# Patient Record
Sex: Male | Born: 1940
Health system: Southern US, Community
[De-identification: ages and names within clinical notes are randomized; demographics above are authoritative.]

## PROBLEM LIST (undated history)

## (undated) DIAGNOSIS — I10 Essential (primary) hypertension: Secondary | ICD-10-CM

## (undated) DIAGNOSIS — K759 Inflammatory liver disease, unspecified: Secondary | ICD-10-CM

## (undated) DIAGNOSIS — D649 Anemia, unspecified: Secondary | ICD-10-CM

## (undated) DIAGNOSIS — Z87442 Personal history of urinary calculi: Secondary | ICD-10-CM

## (undated) DIAGNOSIS — N189 Chronic kidney disease, unspecified: Secondary | ICD-10-CM

## (undated) DIAGNOSIS — E785 Hyperlipidemia, unspecified: Secondary | ICD-10-CM

## (undated) DIAGNOSIS — E119 Type 2 diabetes mellitus without complications: Secondary | ICD-10-CM

## (undated) HISTORY — DX: Chronic kidney disease, unspecified: N18.9

## (undated) HISTORY — DX: Hyperlipidemia, unspecified: E78.5

## (undated) HISTORY — DX: Anemia, unspecified: D64.9

---

## 2007-02-02 ENCOUNTER — Emergency Department (HOSPITAL_COMMUNITY): Admission: EM | Admit: 2007-02-02 | Discharge: 2007-02-02 | Payer: Self-pay | Admitting: Emergency Medicine
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
## 2007-03-02 ENCOUNTER — Encounter (HOSPITAL_COMMUNITY): Admission: RE | Admit: 2007-03-02 | Discharge: 2007-04-01 | Payer: Self-pay | Admitting: Internal Medicine

## 2007-08-24 ENCOUNTER — Ambulatory Visit (HOSPITAL_COMMUNITY): Admission: RE | Admit: 2007-08-24 | Discharge: 2007-08-24 | Payer: Self-pay | Admitting: Nephrology

## 2007-08-24 IMAGING — US US RENAL
1 series · 14 of 25 positions shown · non-contrast
Comparison: None

CLINICAL DATA: Renal insufficiency

RENAL/URINARY TRACT ULTRASOUND
TECHNIQUE: Complete ultrasound examination of the urinary tract
was performed including evaluation of the kidneys renal collecting
systems and urinary bladder.

[Series 1: us renal · 0.34mm/px · 14 of 30 slices shown]
[im 1/30]
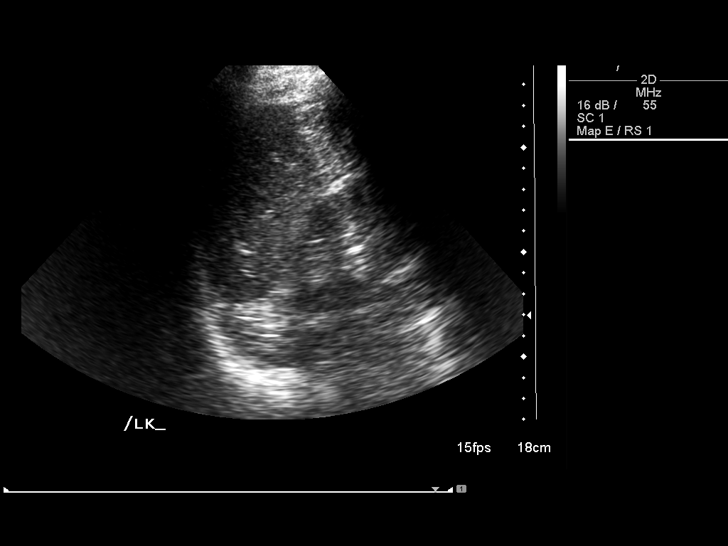
[im 3/30]
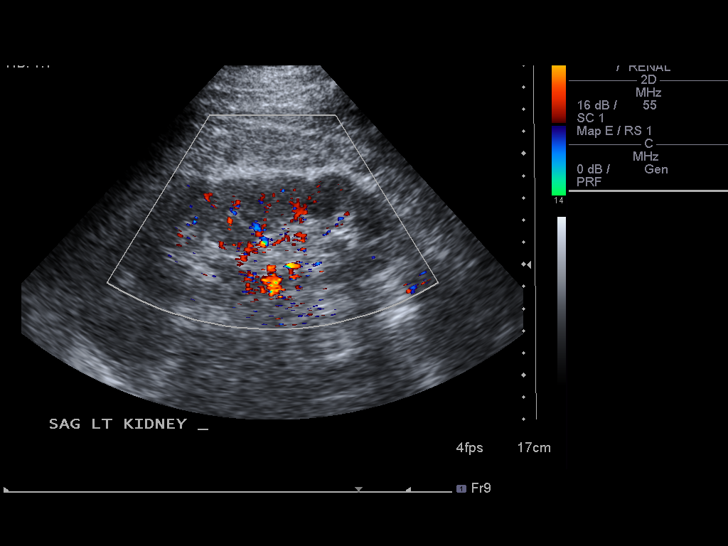
[im 5/30]
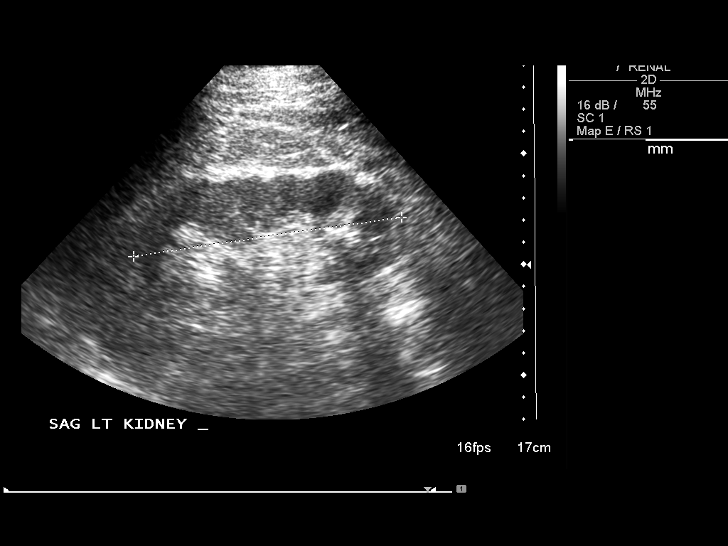
[im 8/30]
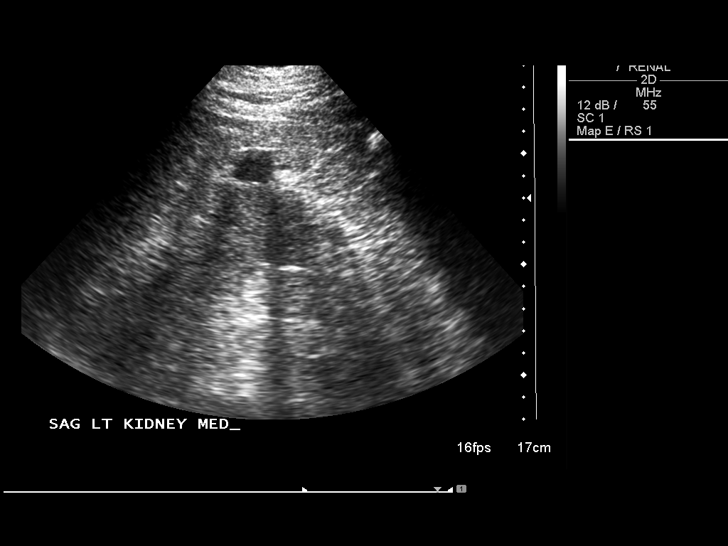
[im 10/30]
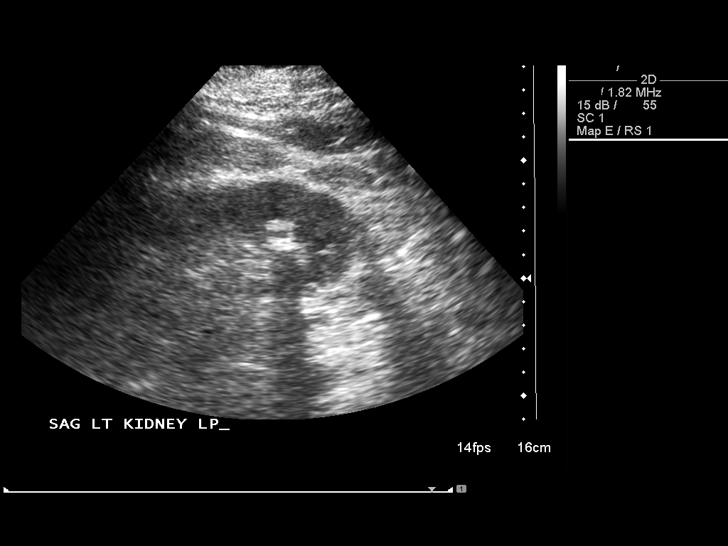
[im 11/30]
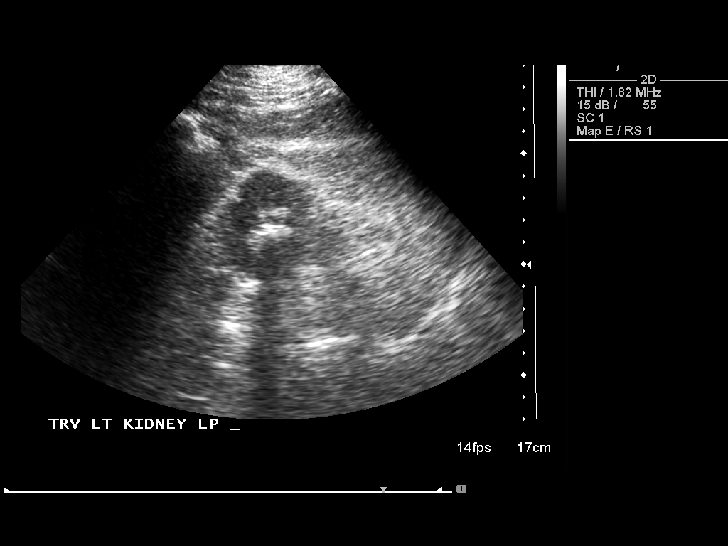
[im 14/30]
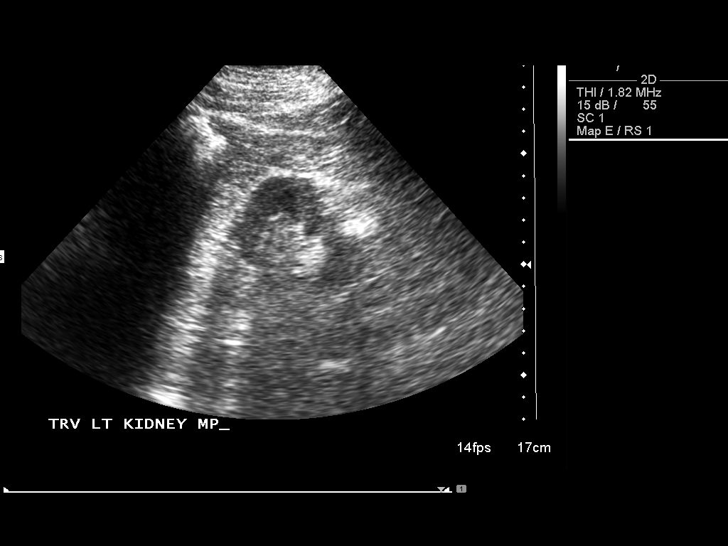
[im 16/30]
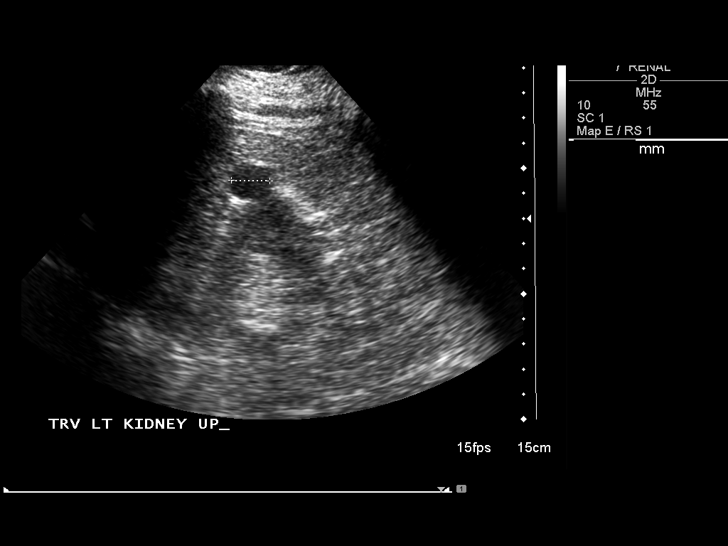
[im 19/30]
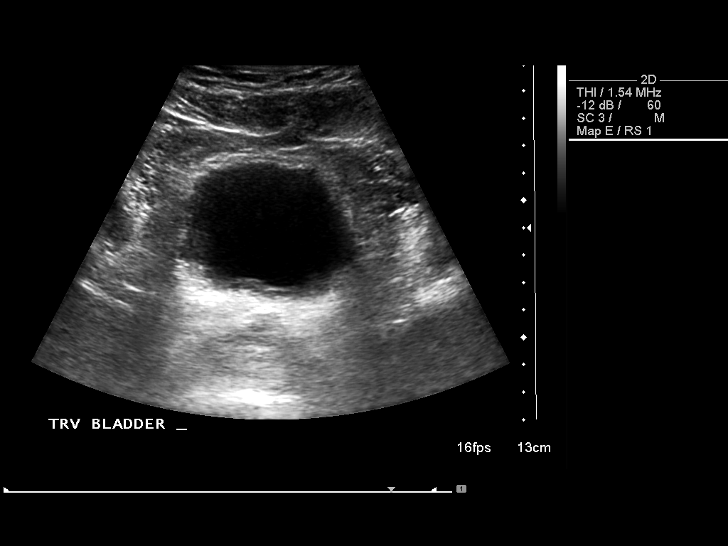
[im 20/30]
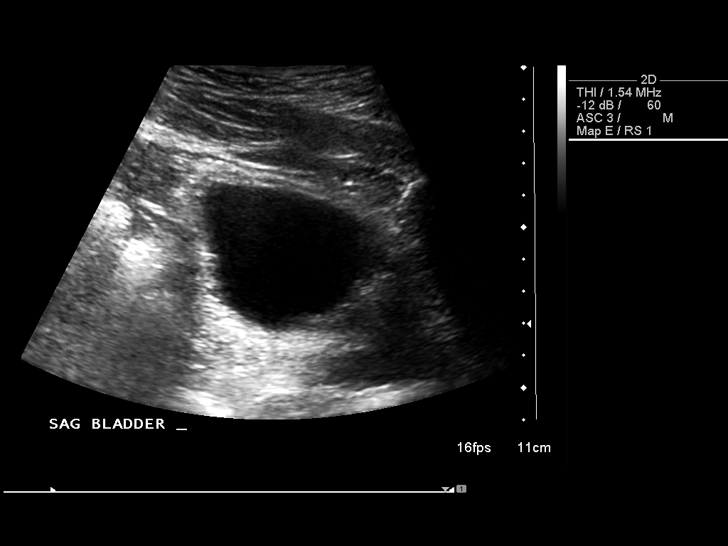
[im 22/30]
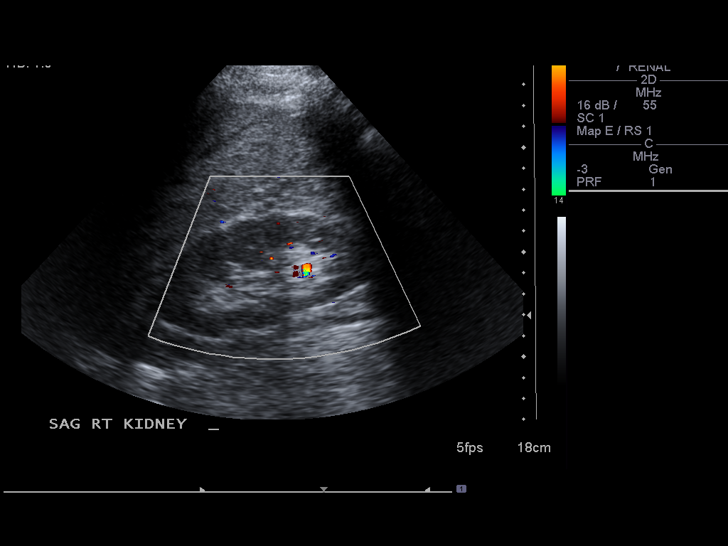
[im 25/30]
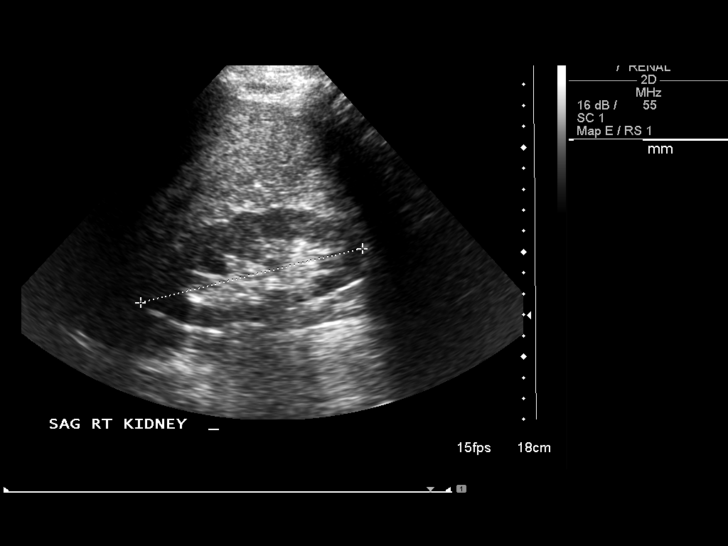
[im 27/30]
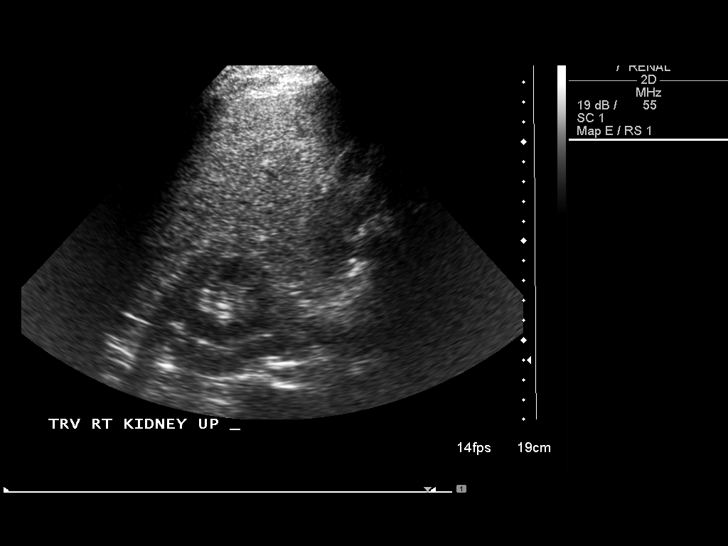
[im 30/30]
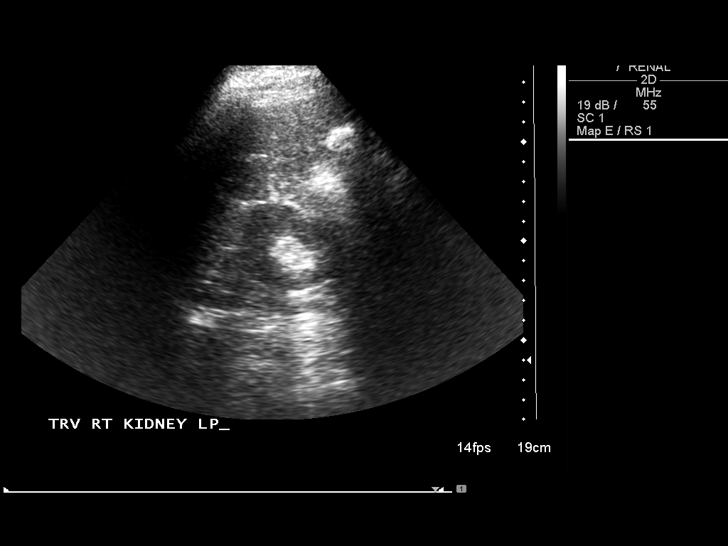

[14 of 25 positions shown; findings below may reference images not displayed]

FINDINGS: .  The right kidney is normal measuring 10.9 cm in
length.  No focal lesion or hydronephrosis.  Cortical echogenicity
and cortical volume are normal.

The left kidney measures 12.2 cm in length.  There is a 1.7 x 1.4 x
1.5 cm cyst at the upper pole laterally.  No hydronephrosis.  There
are a few echogenic foci in the lower pole likely represent small
stones.

The bladder is unremarkable, not fully distended.
IMPRESSION: Kidneys are normal in size and echogenicity.  No hydronephrosis.

simple cyst upper pole left kidney.

Renal calculi lower pole left kidney.

## 2014-05-30 DIAGNOSIS — I10 Essential (primary) hypertension: Secondary | ICD-10-CM | POA: Diagnosis not present

## 2014-05-30 DIAGNOSIS — E785 Hyperlipidemia, unspecified: Secondary | ICD-10-CM | POA: Diagnosis not present

## 2014-05-30 DIAGNOSIS — N184 Chronic kidney disease, stage 4 (severe): Secondary | ICD-10-CM | POA: Diagnosis not present

## 2014-05-30 DIAGNOSIS — E119 Type 2 diabetes mellitus without complications: Secondary | ICD-10-CM | POA: Diagnosis not present

## 2014-05-30 DIAGNOSIS — E782 Mixed hyperlipidemia: Secondary | ICD-10-CM | POA: Diagnosis not present

## 2014-05-30 DIAGNOSIS — D638 Anemia in other chronic diseases classified elsewhere: Secondary | ICD-10-CM | POA: Diagnosis not present

## 2014-05-30 DIAGNOSIS — E1122 Type 2 diabetes mellitus with diabetic chronic kidney disease: Secondary | ICD-10-CM | POA: Diagnosis not present

## 2014-06-28 DIAGNOSIS — E1122 Type 2 diabetes mellitus with diabetic chronic kidney disease: Secondary | ICD-10-CM | POA: Diagnosis not present

## 2014-06-28 DIAGNOSIS — I10 Essential (primary) hypertension: Secondary | ICD-10-CM | POA: Diagnosis not present

## 2014-06-28 DIAGNOSIS — Z6837 Body mass index (BMI) 37.0-37.9, adult: Secondary | ICD-10-CM | POA: Diagnosis not present

## 2014-08-09 DIAGNOSIS — E119 Type 2 diabetes mellitus without complications: Secondary | ICD-10-CM | POA: Diagnosis not present

## 2014-11-27 DIAGNOSIS — E119 Type 2 diabetes mellitus without complications: Secondary | ICD-10-CM | POA: Diagnosis not present

## 2015-03-12 DIAGNOSIS — E119 Type 2 diabetes mellitus without complications: Secondary | ICD-10-CM | POA: Diagnosis not present

## 2015-11-21 DIAGNOSIS — N184 Chronic kidney disease, stage 4 (severe): Secondary | ICD-10-CM | POA: Diagnosis not present

## 2015-11-21 DIAGNOSIS — E782 Mixed hyperlipidemia: Secondary | ICD-10-CM | POA: Diagnosis not present

## 2015-11-21 DIAGNOSIS — I10 Essential (primary) hypertension: Secondary | ICD-10-CM | POA: Diagnosis not present

## 2015-11-21 DIAGNOSIS — K219 Gastro-esophageal reflux disease without esophagitis: Secondary | ICD-10-CM | POA: Diagnosis not present

## 2015-11-21 DIAGNOSIS — E119 Type 2 diabetes mellitus without complications: Secondary | ICD-10-CM | POA: Diagnosis not present

## 2015-11-21 DIAGNOSIS — D649 Anemia, unspecified: Secondary | ICD-10-CM | POA: Diagnosis not present

## 2015-11-21 DIAGNOSIS — D631 Anemia in chronic kidney disease: Secondary | ICD-10-CM | POA: Diagnosis not present

## 2015-11-21 DIAGNOSIS — E1122 Type 2 diabetes mellitus with diabetic chronic kidney disease: Secondary | ICD-10-CM | POA: Diagnosis not present

## 2015-12-25 DIAGNOSIS — Z23 Encounter for immunization: Secondary | ICD-10-CM | POA: Diagnosis not present

## 2016-01-02 DIAGNOSIS — E782 Mixed hyperlipidemia: Secondary | ICD-10-CM | POA: Diagnosis not present

## 2016-01-02 DIAGNOSIS — D631 Anemia in chronic kidney disease: Secondary | ICD-10-CM | POA: Diagnosis not present

## 2016-01-02 DIAGNOSIS — I10 Essential (primary) hypertension: Secondary | ICD-10-CM | POA: Diagnosis not present

## 2016-01-02 DIAGNOSIS — N184 Chronic kidney disease, stage 4 (severe): Secondary | ICD-10-CM | POA: Diagnosis not present

## 2016-01-02 DIAGNOSIS — E1122 Type 2 diabetes mellitus with diabetic chronic kidney disease: Secondary | ICD-10-CM | POA: Diagnosis not present

## 2016-01-08 DIAGNOSIS — H2513 Age-related nuclear cataract, bilateral: Secondary | ICD-10-CM | POA: Diagnosis not present

## 2016-01-08 DIAGNOSIS — E119 Type 2 diabetes mellitus without complications: Secondary | ICD-10-CM | POA: Diagnosis not present

## 2016-01-08 DIAGNOSIS — Z794 Long term (current) use of insulin: Secondary | ICD-10-CM | POA: Diagnosis not present

## 2016-01-29 DIAGNOSIS — E1122 Type 2 diabetes mellitus with diabetic chronic kidney disease: Secondary | ICD-10-CM | POA: Diagnosis not present

## 2016-01-29 DIAGNOSIS — I10 Essential (primary) hypertension: Secondary | ICD-10-CM | POA: Diagnosis not present

## 2016-01-29 DIAGNOSIS — Z6835 Body mass index (BMI) 35.0-35.9, adult: Secondary | ICD-10-CM | POA: Diagnosis not present

## 2016-01-29 DIAGNOSIS — D631 Anemia in chronic kidney disease: Secondary | ICD-10-CM | POA: Diagnosis not present

## 2016-01-29 DIAGNOSIS — N184 Chronic kidney disease, stage 4 (severe): Secondary | ICD-10-CM | POA: Diagnosis not present

## 2016-01-29 DIAGNOSIS — E782 Mixed hyperlipidemia: Secondary | ICD-10-CM | POA: Diagnosis not present

## 2016-03-18 DIAGNOSIS — E1165 Type 2 diabetes mellitus with hyperglycemia: Secondary | ICD-10-CM | POA: Diagnosis not present

## 2016-03-20 DIAGNOSIS — Z125 Encounter for screening for malignant neoplasm of prostate: Secondary | ICD-10-CM | POA: Diagnosis not present

## 2016-03-20 DIAGNOSIS — I10 Essential (primary) hypertension: Secondary | ICD-10-CM | POA: Diagnosis not present

## 2016-03-20 DIAGNOSIS — E1122 Type 2 diabetes mellitus with diabetic chronic kidney disease: Secondary | ICD-10-CM | POA: Diagnosis not present

## 2016-03-24 DIAGNOSIS — E782 Mixed hyperlipidemia: Secondary | ICD-10-CM | POA: Diagnosis not present

## 2016-03-24 DIAGNOSIS — N184 Chronic kidney disease, stage 4 (severe): Secondary | ICD-10-CM | POA: Diagnosis not present

## 2016-03-24 DIAGNOSIS — Z6836 Body mass index (BMI) 36.0-36.9, adult: Secondary | ICD-10-CM | POA: Diagnosis not present

## 2016-03-24 DIAGNOSIS — I10 Essential (primary) hypertension: Secondary | ICD-10-CM | POA: Diagnosis not present

## 2016-03-24 DIAGNOSIS — K219 Gastro-esophageal reflux disease without esophagitis: Secondary | ICD-10-CM | POA: Diagnosis not present

## 2016-03-24 DIAGNOSIS — E1122 Type 2 diabetes mellitus with diabetic chronic kidney disease: Secondary | ICD-10-CM | POA: Diagnosis not present

## 2016-03-24 DIAGNOSIS — D631 Anemia in chronic kidney disease: Secondary | ICD-10-CM | POA: Diagnosis not present

## 2016-04-01 DIAGNOSIS — R809 Proteinuria, unspecified: Secondary | ICD-10-CM | POA: Diagnosis not present

## 2016-04-01 DIAGNOSIS — E1129 Type 2 diabetes mellitus with other diabetic kidney complication: Secondary | ICD-10-CM | POA: Diagnosis not present

## 2016-04-01 DIAGNOSIS — D649 Anemia, unspecified: Secondary | ICD-10-CM | POA: Diagnosis not present

## 2016-04-01 DIAGNOSIS — Z7189 Other specified counseling: Secondary | ICD-10-CM | POA: Diagnosis not present

## 2016-04-01 DIAGNOSIS — N183 Chronic kidney disease, stage 3 (moderate): Secondary | ICD-10-CM | POA: Diagnosis not present

## 2016-04-10 DIAGNOSIS — E1122 Type 2 diabetes mellitus with diabetic chronic kidney disease: Secondary | ICD-10-CM | POA: Diagnosis not present

## 2016-04-10 DIAGNOSIS — N184 Chronic kidney disease, stage 4 (severe): Secondary | ICD-10-CM | POA: Diagnosis not present

## 2016-04-10 DIAGNOSIS — E782 Mixed hyperlipidemia: Secondary | ICD-10-CM | POA: Diagnosis not present

## 2016-04-27 ENCOUNTER — Other Ambulatory Visit (HOSPITAL_COMMUNITY): Payer: Self-pay | Admitting: Nephrology

## 2016-04-27 DIAGNOSIS — N183 Chronic kidney disease, stage 3 unspecified: Secondary | ICD-10-CM

## 2016-05-07 ENCOUNTER — Ambulatory Visit (HOSPITAL_COMMUNITY)
Admission: RE | Admit: 2016-05-07 | Discharge: 2016-05-07 | Disposition: A | Discharge status: Home / Self Care | Payer: Medicare HMO | Source: Ambulatory Visit | Attending: Nephrology | Admitting: Nephrology

## 2016-05-07 DIAGNOSIS — N183 Chronic kidney disease, stage 3 unspecified: Secondary | ICD-10-CM

## 2016-05-07 DIAGNOSIS — N200 Calculus of kidney: Secondary | ICD-10-CM | POA: Insufficient documentation

## 2016-05-07 DIAGNOSIS — I10 Essential (primary) hypertension: Secondary | ICD-10-CM | POA: Diagnosis not present

## 2016-05-07 DIAGNOSIS — Z1159 Encounter for screening for other viral diseases: Secondary | ICD-10-CM | POA: Diagnosis not present

## 2016-05-07 DIAGNOSIS — N281 Cyst of kidney, acquired: Secondary | ICD-10-CM | POA: Insufficient documentation

## 2016-05-07 DIAGNOSIS — Z79899 Other long term (current) drug therapy: Secondary | ICD-10-CM | POA: Diagnosis not present

## 2016-05-07 DIAGNOSIS — D509 Iron deficiency anemia, unspecified: Secondary | ICD-10-CM | POA: Diagnosis not present

## 2016-05-07 DIAGNOSIS — E559 Vitamin D deficiency, unspecified: Secondary | ICD-10-CM | POA: Diagnosis not present

## 2016-05-07 DIAGNOSIS — R809 Proteinuria, unspecified: Secondary | ICD-10-CM | POA: Diagnosis not present

## 2016-06-10 DIAGNOSIS — Z6836 Body mass index (BMI) 36.0-36.9, adult: Secondary | ICD-10-CM | POA: Diagnosis not present

## 2016-06-10 DIAGNOSIS — E782 Mixed hyperlipidemia: Secondary | ICD-10-CM | POA: Diagnosis not present

## 2016-06-10 DIAGNOSIS — E1122 Type 2 diabetes mellitus with diabetic chronic kidney disease: Secondary | ICD-10-CM | POA: Diagnosis not present

## 2016-06-10 DIAGNOSIS — D631 Anemia in chronic kidney disease: Secondary | ICD-10-CM | POA: Diagnosis not present

## 2016-06-10 DIAGNOSIS — N184 Chronic kidney disease, stage 4 (severe): Secondary | ICD-10-CM | POA: Diagnosis not present

## 2016-06-10 DIAGNOSIS — I10 Essential (primary) hypertension: Secondary | ICD-10-CM | POA: Diagnosis not present

## 2016-06-24 DIAGNOSIS — D631 Anemia in chronic kidney disease: Secondary | ICD-10-CM | POA: Diagnosis not present

## 2016-06-24 DIAGNOSIS — I10 Essential (primary) hypertension: Secondary | ICD-10-CM | POA: Diagnosis not present

## 2016-06-24 DIAGNOSIS — E1122 Type 2 diabetes mellitus with diabetic chronic kidney disease: Secondary | ICD-10-CM | POA: Diagnosis not present

## 2016-06-26 DIAGNOSIS — Z6836 Body mass index (BMI) 36.0-36.9, adult: Secondary | ICD-10-CM | POA: Diagnosis not present

## 2016-06-26 DIAGNOSIS — N184 Chronic kidney disease, stage 4 (severe): Secondary | ICD-10-CM | POA: Diagnosis not present

## 2016-06-26 DIAGNOSIS — E782 Mixed hyperlipidemia: Secondary | ICD-10-CM | POA: Diagnosis not present

## 2016-06-26 DIAGNOSIS — D631 Anemia in chronic kidney disease: Secondary | ICD-10-CM | POA: Diagnosis not present

## 2016-06-26 DIAGNOSIS — E1122 Type 2 diabetes mellitus with diabetic chronic kidney disease: Secondary | ICD-10-CM | POA: Diagnosis not present

## 2016-06-26 DIAGNOSIS — I10 Essential (primary) hypertension: Secondary | ICD-10-CM | POA: Diagnosis not present

## 2016-09-29 DIAGNOSIS — I10 Essential (primary) hypertension: Secondary | ICD-10-CM | POA: Diagnosis not present

## 2016-09-29 DIAGNOSIS — E1122 Type 2 diabetes mellitus with diabetic chronic kidney disease: Secondary | ICD-10-CM | POA: Diagnosis not present

## 2016-10-01 DIAGNOSIS — I10 Essential (primary) hypertension: Secondary | ICD-10-CM | POA: Diagnosis not present

## 2016-10-01 DIAGNOSIS — N184 Chronic kidney disease, stage 4 (severe): Secondary | ICD-10-CM | POA: Diagnosis not present

## 2016-10-01 DIAGNOSIS — D631 Anemia in chronic kidney disease: Secondary | ICD-10-CM | POA: Diagnosis not present

## 2016-10-01 DIAGNOSIS — E782 Mixed hyperlipidemia: Secondary | ICD-10-CM | POA: Diagnosis not present

## 2016-10-01 DIAGNOSIS — Z6832 Body mass index (BMI) 32.0-32.9, adult: Secondary | ICD-10-CM | POA: Diagnosis not present

## 2016-10-01 DIAGNOSIS — E1122 Type 2 diabetes mellitus with diabetic chronic kidney disease: Secondary | ICD-10-CM | POA: Diagnosis not present

## 2016-12-15 DIAGNOSIS — E782 Mixed hyperlipidemia: Secondary | ICD-10-CM | POA: Diagnosis not present

## 2016-12-15 DIAGNOSIS — I10 Essential (primary) hypertension: Secondary | ICD-10-CM | POA: Diagnosis not present

## 2016-12-15 DIAGNOSIS — E1122 Type 2 diabetes mellitus with diabetic chronic kidney disease: Secondary | ICD-10-CM | POA: Diagnosis not present

## 2016-12-17 DIAGNOSIS — E1122 Type 2 diabetes mellitus with diabetic chronic kidney disease: Secondary | ICD-10-CM | POA: Diagnosis not present

## 2016-12-17 DIAGNOSIS — D631 Anemia in chronic kidney disease: Secondary | ICD-10-CM | POA: Diagnosis not present

## 2016-12-17 DIAGNOSIS — N184 Chronic kidney disease, stage 4 (severe): Secondary | ICD-10-CM | POA: Diagnosis not present

## 2016-12-17 DIAGNOSIS — I10 Essential (primary) hypertension: Secondary | ICD-10-CM | POA: Diagnosis not present

## 2016-12-17 DIAGNOSIS — E782 Mixed hyperlipidemia: Secondary | ICD-10-CM | POA: Diagnosis not present

## 2016-12-17 DIAGNOSIS — Z6836 Body mass index (BMI) 36.0-36.9, adult: Secondary | ICD-10-CM | POA: Diagnosis not present

## 2017-02-23 DIAGNOSIS — N183 Chronic kidney disease, stage 3 (moderate): Secondary | ICD-10-CM | POA: Diagnosis not present

## 2017-02-23 DIAGNOSIS — Z79899 Other long term (current) drug therapy: Secondary | ICD-10-CM | POA: Diagnosis not present

## 2017-02-23 DIAGNOSIS — I10 Essential (primary) hypertension: Secondary | ICD-10-CM | POA: Diagnosis not present

## 2017-02-23 DIAGNOSIS — R809 Proteinuria, unspecified: Secondary | ICD-10-CM | POA: Diagnosis not present

## 2017-02-23 DIAGNOSIS — D509 Iron deficiency anemia, unspecified: Secondary | ICD-10-CM | POA: Diagnosis not present

## 2017-02-23 DIAGNOSIS — E559 Vitamin D deficiency, unspecified: Secondary | ICD-10-CM | POA: Diagnosis not present

## 2017-03-24 DIAGNOSIS — E119 Type 2 diabetes mellitus without complications: Secondary | ICD-10-CM | POA: Diagnosis not present

## 2017-03-24 DIAGNOSIS — H524 Presbyopia: Secondary | ICD-10-CM | POA: Diagnosis not present

## 2017-03-24 DIAGNOSIS — H5213 Myopia, bilateral: Secondary | ICD-10-CM | POA: Diagnosis not present

## 2017-03-24 DIAGNOSIS — Z794 Long term (current) use of insulin: Secondary | ICD-10-CM | POA: Diagnosis not present

## 2017-03-24 DIAGNOSIS — H43811 Vitreous degeneration, right eye: Secondary | ICD-10-CM | POA: Diagnosis not present

## 2017-03-24 DIAGNOSIS — H2513 Age-related nuclear cataract, bilateral: Secondary | ICD-10-CM | POA: Diagnosis not present

## 2017-03-31 DIAGNOSIS — R809 Proteinuria, unspecified: Secondary | ICD-10-CM | POA: Diagnosis not present

## 2017-03-31 DIAGNOSIS — D638 Anemia in other chronic diseases classified elsewhere: Secondary | ICD-10-CM | POA: Diagnosis not present

## 2017-03-31 DIAGNOSIS — N184 Chronic kidney disease, stage 4 (severe): Secondary | ICD-10-CM | POA: Diagnosis not present

## 2017-04-20 DIAGNOSIS — D631 Anemia in chronic kidney disease: Secondary | ICD-10-CM | POA: Diagnosis not present

## 2017-04-20 DIAGNOSIS — E782 Mixed hyperlipidemia: Secondary | ICD-10-CM | POA: Diagnosis not present

## 2017-04-20 DIAGNOSIS — I10 Essential (primary) hypertension: Secondary | ICD-10-CM | POA: Diagnosis not present

## 2017-04-20 DIAGNOSIS — E1122 Type 2 diabetes mellitus with diabetic chronic kidney disease: Secondary | ICD-10-CM | POA: Diagnosis not present

## 2017-04-20 DIAGNOSIS — N184 Chronic kidney disease, stage 4 (severe): Secondary | ICD-10-CM | POA: Diagnosis not present

## 2017-04-22 DIAGNOSIS — I10 Essential (primary) hypertension: Secondary | ICD-10-CM | POA: Diagnosis not present

## 2017-04-22 DIAGNOSIS — G9009 Other idiopathic peripheral autonomic neuropathy: Secondary | ICD-10-CM | POA: Diagnosis not present

## 2017-04-22 DIAGNOSIS — E1122 Type 2 diabetes mellitus with diabetic chronic kidney disease: Secondary | ICD-10-CM | POA: Diagnosis not present

## 2017-04-22 DIAGNOSIS — Z6837 Body mass index (BMI) 37.0-37.9, adult: Secondary | ICD-10-CM | POA: Diagnosis not present

## 2017-04-22 DIAGNOSIS — E782 Mixed hyperlipidemia: Secondary | ICD-10-CM | POA: Diagnosis not present

## 2017-04-22 DIAGNOSIS — N184 Chronic kidney disease, stage 4 (severe): Secondary | ICD-10-CM | POA: Diagnosis not present

## 2017-04-22 DIAGNOSIS — D631 Anemia in chronic kidney disease: Secondary | ICD-10-CM | POA: Diagnosis not present

## 2017-05-13 IMAGING — US US RENAL
1 series · 14 of 25 positions shown · non-contrast
Comparison: Renal ultrasound [DATE]

CLINICAL DATA: Chronic renal insufficiency stage III ; history of
kidney stones and left-sided renal cyst.

EXAM:
RENAL / URINARY TRACT ULTRASOUND COMPLETE

[Series 1: us renal · 0.23mm/px · 14 of 55 slices shown]
[im 1/55]
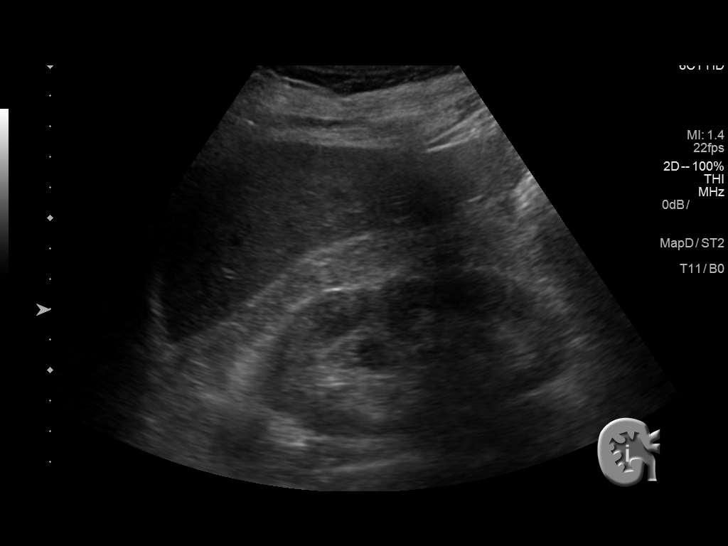
[im 5/55]
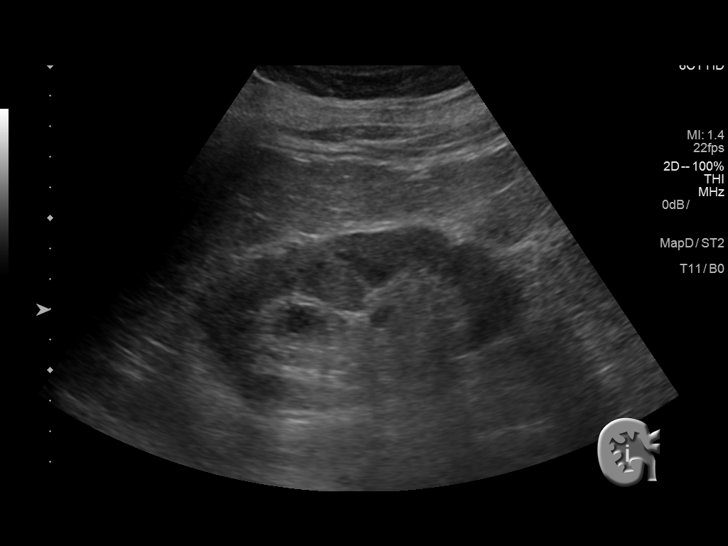
[im 10/55]
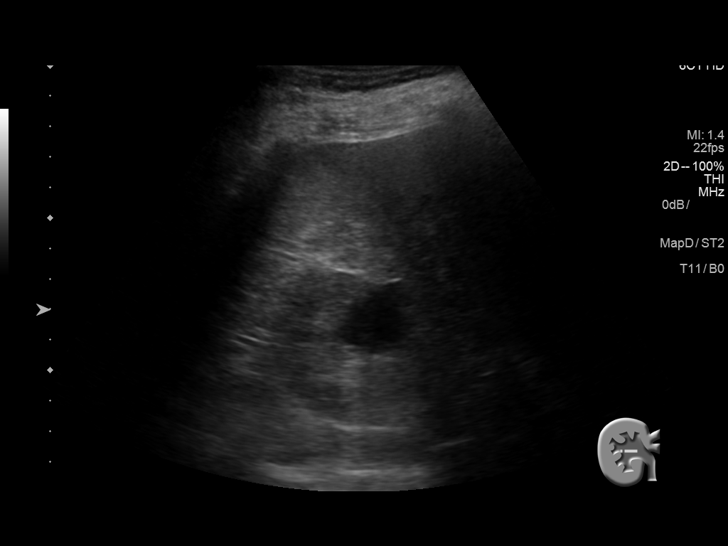
[im 14/55]
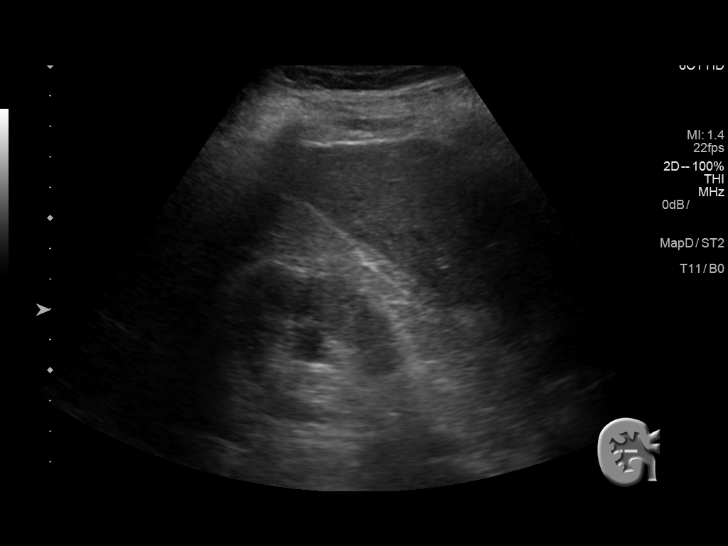
[im 19/55]
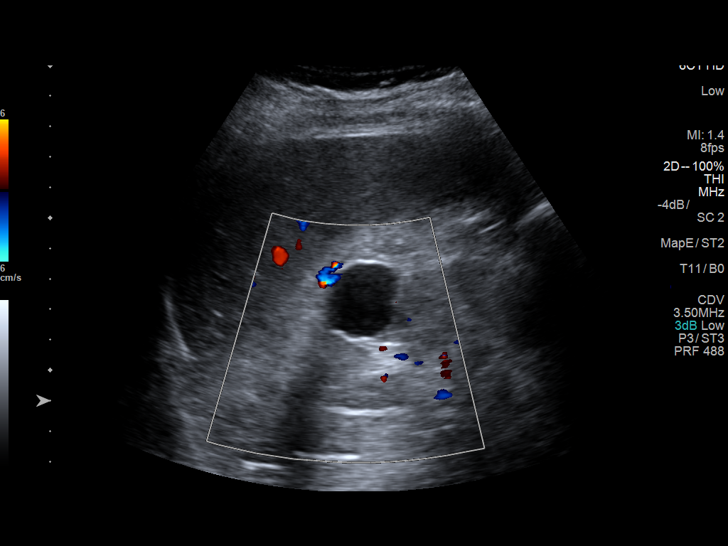
[im 21/55]
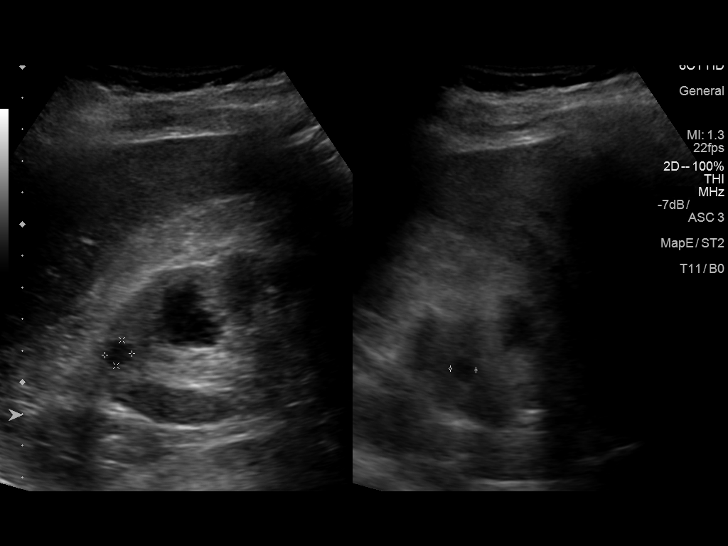
[im 25/55]
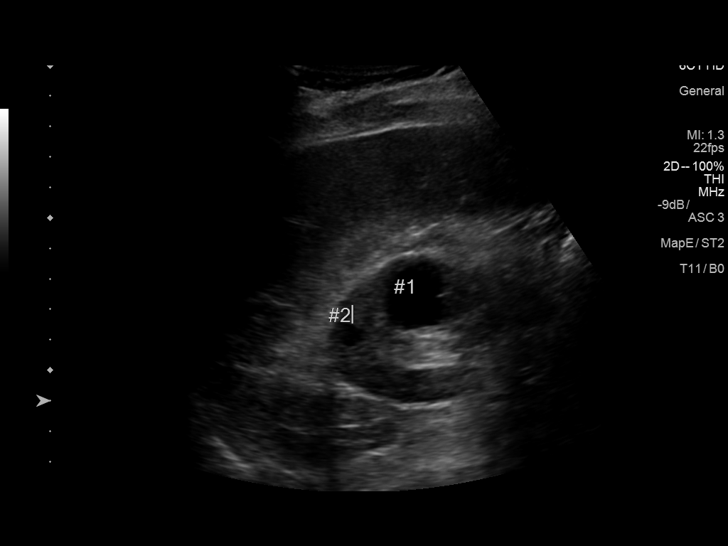
[im 30/55]
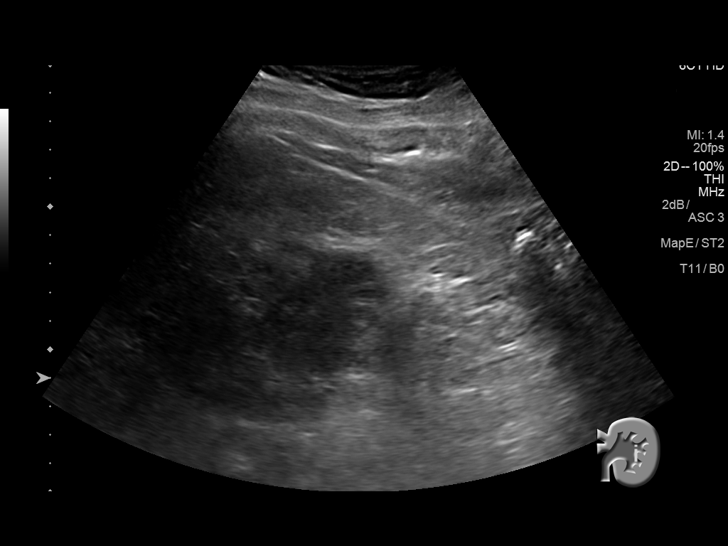
[im 34/55]
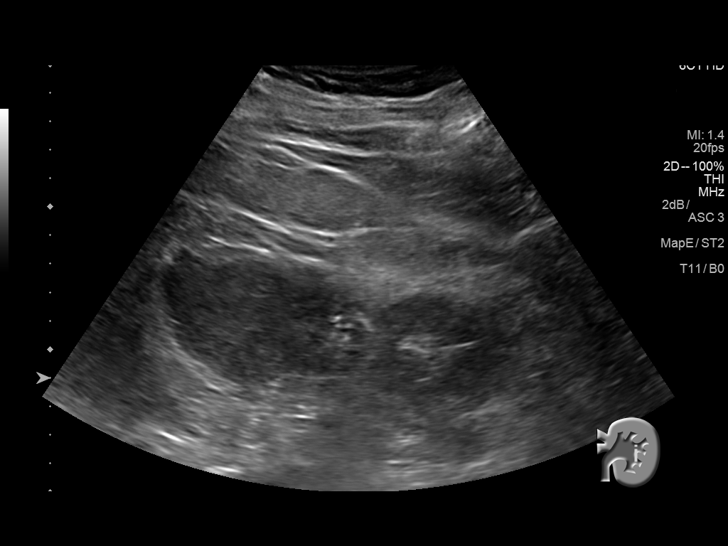
[im 37/55]
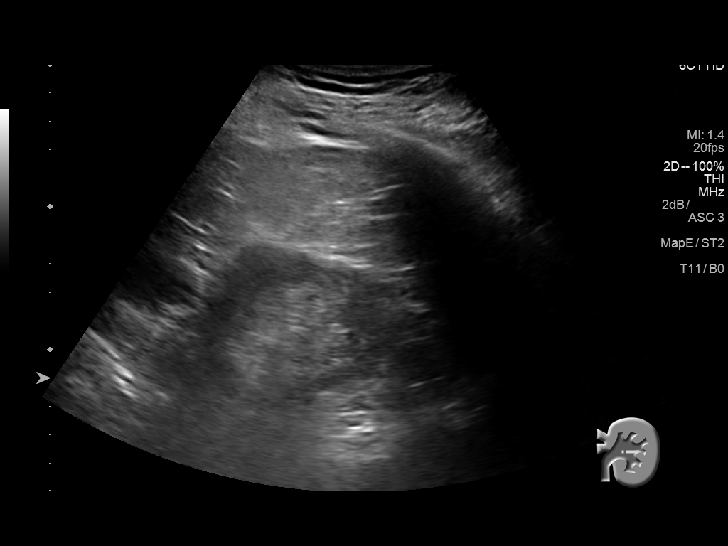
[im 41/55]
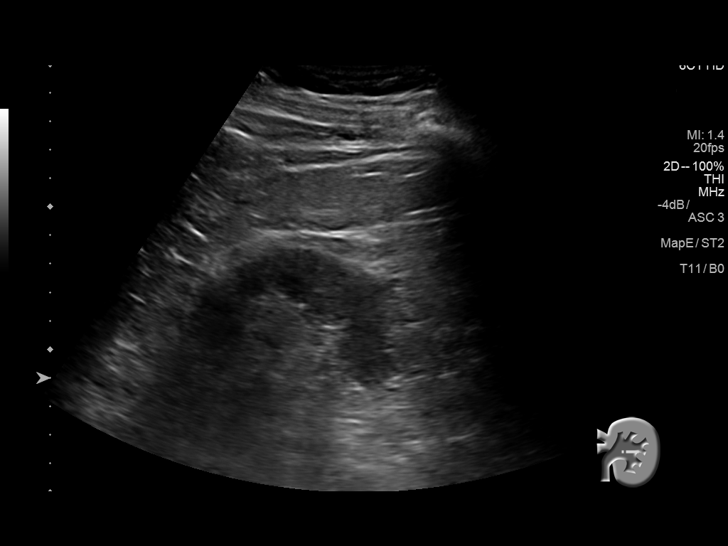
[im 46/55]
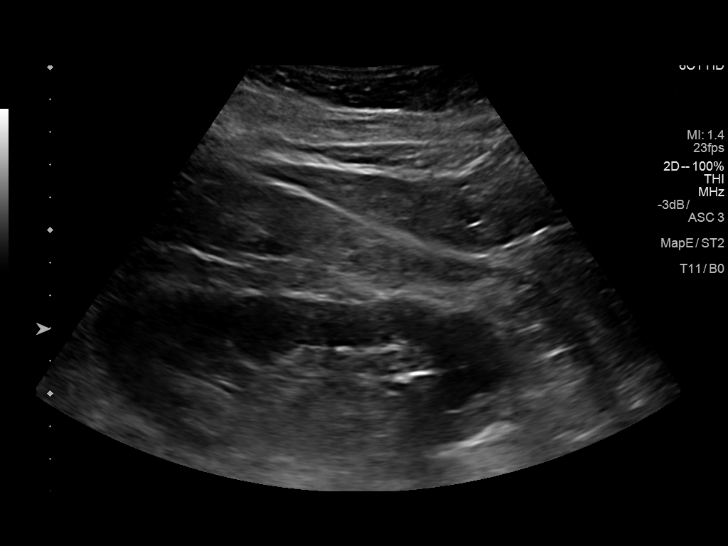
[im 50/55]
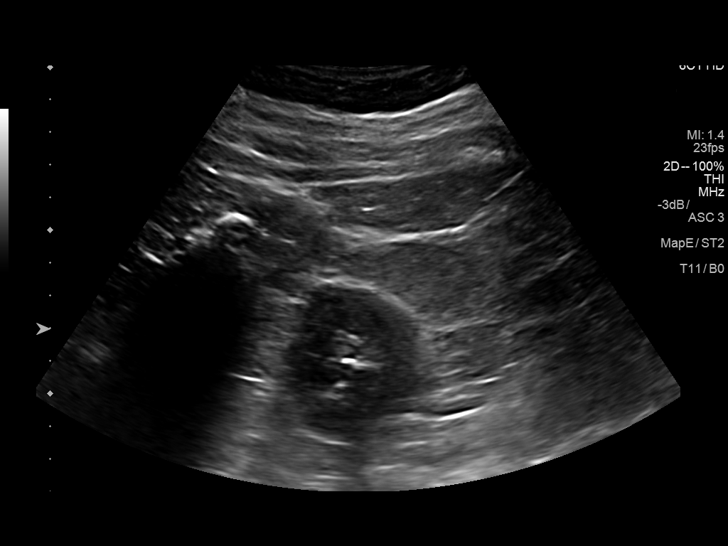
[im 55/55]
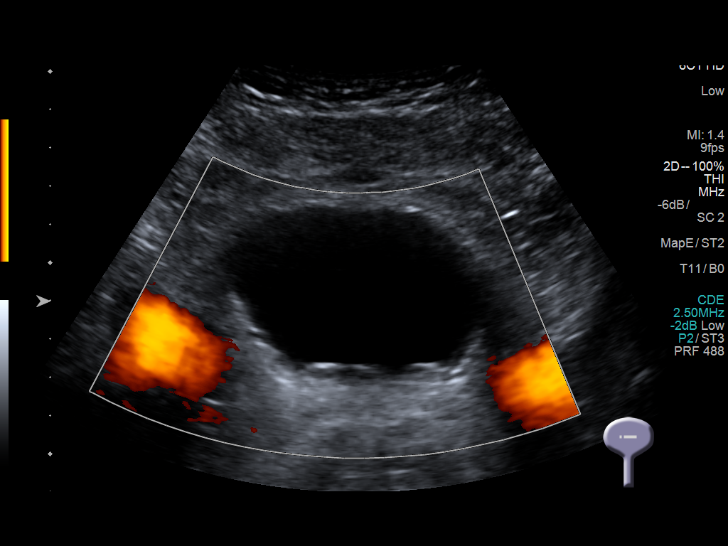

[14 of 25 positions shown; findings below may reference images not displayed]

FINDINGS: Right Kidney:

Length: 10.7 cm.. The renal cortical echotexture remains lower than
that of the adjacent liver. There is an upper pole cyst measuring
2.2 x 2.7 x 2.1 cm. There is a smaller upper pole cortical cyst
measuring 0.9 x 0.8 x 0.8 cm. There is no hydronephrosis.

Left Kidney:

Length: 11.6 cm. The renal cortical echotexture is similar to that
of the right kidney. There is an exophytic midpole cortical cyst
measuring 2.1 x 1.9 x 1.8 cm. There is a 7 mm nonobstructing lower
pole kidney stone. There is no hydronephrosis.

Bladder:

The urinary bladder is only partially distended. No ureteral jets
were observed.
IMPRESSION: Simple appearing cysts in both kidneys. Nonobstructing 7 mm lower
pole stone in the left kidney. No hydronephrosis. Visualization of
the urinary bladder was limited due to partial distention. No
ureteral jets were visualized.

## 2017-05-18 DIAGNOSIS — J019 Acute sinusitis, unspecified: Secondary | ICD-10-CM | POA: Diagnosis not present

## 2017-05-18 DIAGNOSIS — Z6837 Body mass index (BMI) 37.0-37.9, adult: Secondary | ICD-10-CM | POA: Diagnosis not present

## 2017-05-19 ENCOUNTER — Ambulatory Visit: Payer: Medicare HMO

## 2017-06-14 ENCOUNTER — Ambulatory Visit (INDEPENDENT_AMBULATORY_CARE_PROVIDER_SITE_OTHER): Payer: Self-pay

## 2017-06-14 DIAGNOSIS — Z1211 Encounter for screening for malignant neoplasm of colon: Secondary | ICD-10-CM

## 2017-06-14 MED ORDER — PEG 3350-KCL-NA BICARB-NACL 420 G PO SOLR: 4000.0000 mL | ORAL | 0 refills | Status: DC

## 2017-06-14 NOTE — Progress Notes (Signed)
Ok to schedule.   DM Meds: Half night before, none morning of.  Check CBGs every 4 hours while awake. Make sure they're comsuming clears that have some sugar in them to help prevent low blood sugar. They should check their blood sugar when they get her checked in to the endo unit.

## 2017-06-14 NOTE — Progress Notes (Signed)
Gastroenterology Pre-Procedure Review  Request Date:06/14/17 Requesting Physician: Debbra Riding ( no previous tcs)  PATIENT REVIEW QUESTIONS: The patient responded to the following health history questions as indicated:    1. Diabetes Melitis: yes tresiba 28u at bedtime. 2. Joint replacements in the past 12 months: no 3. Major health problems in the past 3 months: no 4. Has an artificial valve or MVP: no 5. Has a defibrillator: no 6. Has been advised in past to take antibiotics in advance of a procedure like teeth cleaning: no 7. Family history of colon cancer: yes (son age 22)  41. Alcohol Use: no 9. History of sleep apnea: no  10. History of coronary artery or other vascular stents placed within the last 12 months: no 11. History of any prior anesthesia complications: no    MEDICATIONS & ALLERGIES:    Patient reports the following regarding taking any blood thinners:   Plavix? no Aspirin? Yes 81mg  Coumadin? no Brilinta? no Xarelto? no Eliquis? no Pradaxa? no Savaysa? no Effient? no  Patient confirms/reports the following medications:  Current Outpatient Medications  Medication Sig Dispense Refill  . amLODipine (NORVASC) 10 MG tablet daily.    . cloNIDine (CATAPRES) 0.2 MG tablet 2 (two) times daily.    Marland Kitchen lisinopril-hydrochlorothiazide (PRINZIDE,ZESTORETIC) 20-25 MG tablet daily.    . pioglitazone (ACTOS) 15 MG tablet 15 mg daily.    . rosuvastatin (CRESTOR) 10 MG tablet daily.    . TRESIBA FLEXTOUCH 200 UNIT/ML SOPN 28 Units at bedtime.    Marland Kitchen UNABLE TO FIND Med Name: stem cell maxum supplement    . UNABLE TO FIND Med Name: super beta prostate     No current facility-administered medications for this visit.     Patient confirms/reports the following allergies:  No Known Allergies  No orders of the defined types were placed in this encounter.   AUTHORIZATION INFORMATION Primary Insurance: Otto Herb #: [id number] Pre-Cert / Josem Kaufmann required: no   SCHEDULE  INFORMATION: Procedure has been scheduled as follows:  Date: 07/07/17, Time: 8:30 Location: [facility] Dr.Fields  This Gastroenterology Pre-Precedure Review Form is being routed to the following provider(s): Walden Field NP

## 2017-06-14 NOTE — Patient Instructions (Signed)
Trevor [name]   08/15/1940 MRN: [id number]    Procedure Date: 07/07/17 Time to register: 7:30 Place to register: [facility] Procedure Time: 8:30 Scheduled provider: Dr.Fields  PREPARATION FOR COLONOSCOPY WITH TRI-LYTE SPLIT PREP  Please notify us immediately if you are diabetic, take iron supplements, or if you are on Coumadin or any other blood thinners.   Please hold the following medications: I will mail a letter with this information   You will need to purchase 1 fleet enema and 1 box of Bisacodyl 71m tablets.    1 DAY BEFORE PROCEDURE:  DATE: 07/06/17   DAY: Tuesday Continue clear liquids the entire day - NO SOLID FOOD.   Diabetic medications adjustments for today: see letter   At 2:00 pm:  Take 2 Bisacodyl tablets.   At 4:00pm:  Start drinking your solution. Make sure you mix well per instructions on the bottle. Try to drink 1 (one) 8 ounce glass every 10-15 minutes until you have consumed HALF the jug. You should complete by 6:00pm.You must keep the left over solution refrigerated until completed next day.  Continue clear liquids. You must drink plenty of clear liquids to prevent dehyration and kidney failure. Nothing to eat or drink after midnight.  EXCEPTION: If you take medications for your heart, blood pressure or breathing, you may take these medications with a small amount of clear liquid.    DAY OF PROCEDURE:   DATE: 07/07/17  DAY: Wednesday  Diabetic medications adjustments for today: see letter   Five hours before your procedure time @ 3:30am:  Finish remaining amout of bowel prep, drinking 1 (one) 8 ounce glass every 10-15 minutes until complete. You have two hours to consume remaining prep.   Three hours before your procedure time '@5' :30am:  Nothing by mouth.   At least one hour before going to the hospital:  Give yourself one Fleet enema. You may take your morning medications with sip of water unless we have instructed otherwise.      Please  see below for Dietary Information.  CLEAR LIQUIDS INCLUDE:  Water Jello (NOT red in color)   Ice Popsicles (NOT red in color)   Tea (sugar ok, no milk/cream) Powdered fruit flavored drinks  Coffee (sugar ok, no milk/cream) Gatorade/ Lemonade/ Kool-Aid  (NOT red in color)   Juice: apple, white grape, white cranberry Soft drinks  Clear bullion, consomme, broth (fat free beef/chicken/vegetable)  Carbonated beverages (any kind)  Strained chicken noodle soup Hard Candy   Remember: Clear liquids are liquids that will allow you to see your fingers on the other side of a clear glass. Be sure liquids are NOT red in color, and not cloudy, but CLEAR.  DO NOT EAT OR DRINK ANY OF THE FOLLOWING:  Dairy products of any kind   Cranberry juice Tomato juice / V8 juice   Grapefruit juice Orange juice     Red grape juice  Do not eat any solid foods, including such foods as: cereal, oatmeal, yogurt, fruits, vegetables, creamed soups, eggs, bread, crackers, pureed foods in a blender, etc.   HELPFUL HINTS FOR DRINKING PREP SOLUTION:   Make sure prep is extremely cold. Mix and refrigerate the the morning of the prep. You may also put in the freezer.   You may try mixing some Crystal Light or Country Time Lemonade if you prefer. Mix in small amounts; add more if necessary.  Try drinking through a straw  Rinse mouth with water or a mouthwash between glasses,  to remove after-taste.  Try sipping on a cold beverage /ice/ popsicles between glasses of prep.  Place a piece of sugar-free hard candy in mouth between glasses.  If you become nauseated, try consuming smaller amounts, or stretch out the time between glasses. Stop for 30-60 minutes, then slowly start back drinking.        OTHER INSTRUCTIONS  You will need a responsible adult at least 76 years of age to accompany you and drive you home. This person must remain in the waiting room during your procedure. The hospital will cancel your procedure  if you do not have a responsible adult with you.   1. Wear loose fitting clothing that is easily removed. 2. Leave jewelry and other valuables at home.  3. Remove all body piercing jewelry and leave at home. 4. Total time from sign-in until discharge is approximately 2-3 hours. 5. You should go home directly after your procedure and rest. You can resume normal activities the day after your procedure. 6. The day of your procedure you should not:  Drive  Make legal decisions  Operate machinery  Drink alcohol  Return to work   You may call the office (Dept: [phone]) before 5:00pm, or page the doctor on call [phone]) after 5:00pm, for further instructions, if necessary.   Insurance Information YOU WILL NEED TO CHECK WITH YOUR INSURANCE COMPANY FOR THE BENEFITS OF COVERAGE YOU HAVE FOR THIS PROCEDURE.  UNFORTUNATELY, NOT ALL INSURANCE COMPANIES HAVE BENEFITS TO COVER ALL OR PART OF THESE TYPES OF PROCEDURES.  IT IS YOUR RESPONSIBILITY TO CHECK YOUR BENEFITS, HOWEVER, WE WILL BE GLAD TO ASSIST YOU WITH ANY CODES YOUR INSURANCE COMPANY MAY NEED.    PLEASE NOTE THAT MOST INSURANCE COMPANIES WILL NOT COVER A SCREENING COLONOSCOPY FOR PEOPLE UNDER THE AGE OF 50  IF YOU HAVE BC[employer] INSURANCE, YOU MAY HAVE BENEFITS FOR A SCREENING COLONOSCOPY BUT IF POLYPS ARE FOUND THE DIAGNOSIS WILL CHANGE AND THEN YOU MAY HAVE A DEDUCTIBLE THAT WILL NEED TO BE MET. SO PLEASE MAKE SURE YOU CHECK YOUR BENEFITS FOR A SCREENING COLONOSCOPY AS WELL AS A DIAGNOSTIC COLONOSCOPY.

## 2017-06-16 NOTE — Progress Notes (Signed)
Letter with DM instructions mailed to the pt.

## 2017-07-07 ENCOUNTER — Other Ambulatory Visit: Payer: Self-pay

## 2017-07-07 ENCOUNTER — Encounter (HOSPITAL_COMMUNITY): Payer: Self-pay | Admitting: *Deleted

## 2017-07-07 ENCOUNTER — Ambulatory Visit (HOSPITAL_COMMUNITY)
Admission: RE | Admit: 2017-07-07 | Discharge: 2017-07-07 | Disposition: A | Discharge status: Home / Self Care | Payer: Medicare HMO | Source: Ambulatory Visit | Attending: Gastroenterology | Admitting: Gastroenterology

## 2017-07-07 ENCOUNTER — Encounter (HOSPITAL_COMMUNITY)
Admission: RE | Disposition: A | Discharge status: Home / Self Care | Payer: Self-pay | Source: Ambulatory Visit | Attending: Gastroenterology

## 2017-07-07 DIAGNOSIS — Z7984 Long term (current) use of oral hypoglycemic drugs: Secondary | ICD-10-CM | POA: Insufficient documentation

## 2017-07-07 DIAGNOSIS — Z1212 Encounter for screening for malignant neoplasm of rectum: Secondary | ICD-10-CM | POA: Diagnosis not present

## 2017-07-07 DIAGNOSIS — E119 Type 2 diabetes mellitus without complications: Secondary | ICD-10-CM | POA: Diagnosis not present

## 2017-07-07 DIAGNOSIS — D120 Benign neoplasm of cecum: Secondary | ICD-10-CM | POA: Insufficient documentation

## 2017-07-07 DIAGNOSIS — D122 Benign neoplasm of ascending colon: Secondary | ICD-10-CM | POA: Insufficient documentation

## 2017-07-07 DIAGNOSIS — K644 Residual hemorrhoidal skin tags: Secondary | ICD-10-CM | POA: Diagnosis not present

## 2017-07-07 DIAGNOSIS — Q439 Congenital malformation of intestine, unspecified: Secondary | ICD-10-CM | POA: Insufficient documentation

## 2017-07-07 DIAGNOSIS — Z79899 Other long term (current) drug therapy: Secondary | ICD-10-CM | POA: Insufficient documentation

## 2017-07-07 DIAGNOSIS — Z1211 Encounter for screening for malignant neoplasm of colon: Secondary | ICD-10-CM | POA: Diagnosis not present

## 2017-07-07 DIAGNOSIS — I10 Essential (primary) hypertension: Secondary | ICD-10-CM | POA: Diagnosis not present

## 2017-07-07 DIAGNOSIS — Z7982 Long term (current) use of aspirin: Secondary | ICD-10-CM | POA: Insufficient documentation

## 2017-07-07 HISTORY — DX: Personal history of urinary calculi: Z87.442

## 2017-07-07 HISTORY — DX: Essential (primary) hypertension: I10

## 2017-07-07 HISTORY — DX: Type 2 diabetes mellitus without complications: E11.9

## 2017-07-07 HISTORY — PX: COLONOSCOPY: SHX5424

## 2017-07-07 LAB — GLUCOSE, CAPILLARY: Glucose-Capillary: 128 mg/dL — ABNORMAL HIGH (ref 65–99)

## 2017-07-07 SURGERY — COLONOSCOPY
Anesthesia: Moderate Sedation

## 2017-07-07 MED ORDER — SODIUM CHLORIDE 0.9 % IV SOLN
INTRAVENOUS | Status: DC
  Administered 2017-07-07: 1000 mL via INTRAVENOUS

## 2017-07-07 MED ORDER — STERILE WATER FOR IRRIGATION IR SOLN
Status: DC | PRN
  Administered 2017-07-07: 09:00:00

## 2017-07-07 MED ORDER — MIDAZOLAM HCL 5 MG/5ML IJ SOLN
INTRAMUSCULAR | Status: AC
  Filled 2017-07-07: qty 10

## 2017-07-07 MED ORDER — MEPERIDINE HCL 100 MG/ML IJ SOLN
INTRAMUSCULAR | Status: AC
  Filled 2017-07-07: qty 2

## 2017-07-07 MED ORDER — MIDAZOLAM HCL 5 MG/5ML IJ SOLN
INTRAMUSCULAR | Status: DC | PRN
  Administered 2017-07-07: 1 mg via INTRAVENOUS
  Administered 2017-07-07 (×2): 2 mg via INTRAVENOUS

## 2017-07-07 MED ORDER — MEPERIDINE HCL 100 MG/ML IJ SOLN
INTRAMUSCULAR | Status: DC | PRN
  Administered 2017-07-07: 50 mg
  Administered 2017-07-07: 25 mg

## 2017-07-07 NOTE — H&P (Signed)
Primary Care Physician:  Celene Squibb, MD Primary Gastroenterologist:  Dr. Oneida Alar  Pre-Procedure History & Physical: HPI:  Trevor [name] is a 77 y.o. male here for [place].  Past Medical History:  Diagnosis Date  . Diabetes mellitus without complication ([place])   . History of kidney stones   . Hypertension     History reviewed. No pertinent surgical history.  Prior to Admission medications   Medication Sig Start Date End Date Taking? Authorizing Provider  aspirin EC 81 MG tablet Take 81 mg by mouth daily.   Yes [provider]  cloNIDine (CATAPRES) 0.2 MG tablet Take 0.2 mg by mouth 2 (two) times daily.  04/12/17  Yes [provider]  lisinopril-hydrochlorothiazide (PRINZIDE,ZESTORETIC) 20-25 MG tablet Take 1 tablet by mouth at bedtime.  12/27/15  Yes [provider]  OVER THE COUNTER MEDICATION Take 1 tablet by mouth 2 (two) times daily. Super Beta Prostate   Yes [provider]  OVER THE COUNTER MEDICATION Take 1 tablet by mouth daily. Stem Cell Maxum Supplement   Yes [provider]  pioglitazone (ACTOS) 15 MG tablet Take 15 mg by mouth daily.  04/12/17  Yes [provider]  polyethylene glycol-electrolytes (TRILYTE) 420 g solution Take 4,000 mLs by mouth as directed. 06/14/17  Yes Carlis Stable, NP  rosuvastatin (CRESTOR) 10 MG tablet Take 10 mg by mouth daily.  01/26/17  Yes [provider]  TRESIBA FLEXTOUCH 200 UNIT/ML SOPN Inject 28 Units into the skin at bedtime.  04/15/17  Yes [provider]  amLODipine (NORVASC) 10 MG tablet Take 10 mg by mouth at bedtime.  04/12/17   [provider]    Allergies as of 06/14/2017  . (No Known Allergies)    Family History  Problem Relation Age of Onset  . Diabetes Mother   . Diabetes Brother   . Heart attack Brother   . Colon cancer Son   . Lung cancer Son     Social History   Socioeconomic History  . Marital status: Married    Spouse  name: Not on file  . Number of children: Not on file  . Years of education: Not on file  . Highest education level: Not on file  Occupational History  . Not on file  Social Needs  . Financial resource strain: Not on file  . Food insecurity:    Worry: Not on file    Inability: Not on file  . Transportation needs:    Medical: Not on file    Non-medical: Not on file  Tobacco Use  . Smoking status: Never Smoker  . Smokeless tobacco: Never Used  Substance and Sexual Activity  . Alcohol use: Never    Frequency: Never  . Drug use: Never  . Sexual activity: Not on file  Lifestyle  . Physical activity:    Days per week: Not on file    Minutes per session: Not on file  . Stress: Not on file  Relationships  . Social connections:    Talks on phone: Not on file    Gets together: Not on file    Attends religious service: Not on file    Active member of club or organization: Not on file    Attends meetings of clubs or organizations: Not on file    Relationship status: Not on file  . Intimate partner violence:    Fear of current or ex partner: Not on file    Emotionally abused: Not  on file    Physically abused: Not on file    Forced sexual activity: Not on file  Other Topics Concern  . Not on file  Social History Narrative  . Not on file    Review of Systems: See HPI, otherwise negative ROS   Physical Exam: BP (!) 189/102   Pulse (!) 106   Temp 97.8 F (36.6 C) (Oral)   Resp (!) 21   Ht 5\' 10"  (1.778 m)   Wt 252 lb (114.3 kg)   SpO2 100%   BMI 36.16 kg/m  General:   Alert,  pleasant and cooperative in NAD Head:  Normocephalic and atraumatic. Neck:  Supple; Lungs:  Clear throughout to auscultation.    Heart:  Regular rate and rhythm. Abdomen:  Soft, nontender and nondistended. Normal bowel sounds, without guarding, and without rebound.   Neurologic:  Alert and  oriented x4;  grossly normal neurologically.  Impression/Plan:     SCREENING  Plan:  1. TCS  TODAY DISCUSSED PROCEDURE, BENEFITS, & RISKS: < 1% chance of medication reaction, bleeding, perforation, or rupture of spleen/liver.

## 2017-07-07 NOTE — Discharge Instructions (Signed)
You had 6 polyps removed. You have  MODERATE EXTERNAL hemorrhoids.   DRINK WATER TO KEEP YOUR URINE LIGHT YELLOW.  FOLLOW A HIGH FIBER DIET. AVOID ITEMS THAT CAUSE BLOATING & GAS. SEE INFO BELOW.  YOUR BIOPSY RESULTS WILL BE AVAILABLE IN 7 DAYS.  Next colonoscopy in 3-5 years.    Colonoscopy Care After Read the instructions outlined below and refer to this sheet in the next week. These discharge instructions provide you with general information on caring for yourself after you leave the hospital. While your treatment has been planned according to the most current medical practices available, unavoidable complications occasionally occur. If you have any problems or questions after discharge, call DR. Eddrick Dilone, [phone].  ACTIVITY  You may resume your regular activity, but move at a slower pace for the next 24 hours.   Take frequent rest periods for the next 24 hours.   Walking will help get rid of the air and reduce the bloated feeling in your belly (abdomen).   No driving for 24 hours (because of the medicine (anesthesia) used during the test).   You may shower.   Do not sign any important legal documents or operate any machinery for 24 hours (because of the anesthesia used during the test).    NUTRITION  Drink plenty of fluids.   You may resume your normal diet as instructed by your doctor.   Begin with a light meal and progress to your normal diet. Heavy or fried foods are harder to digest and may make you feel sick to your stomach (nauseated).   Avoid alcoholic beverages for 24 hours or as instructed.    MEDICATIONS  You may resume your normal medications.   WHAT YOU CAN EXPECT TODAY  Some feelings of bloating in the abdomen.   Passage of more gas than usual.   Spotting of blood in your stool or on the toilet paper  .  IF YOU HAD POLYPS REMOVED DURING THE COLONOSCOPY:  Eat a soft diet IF YOU HAVE NAUSEA, BLOATING, ABDOMINAL PAIN, OR VOMITING.      FINDING OUT THE RESULTS OF YOUR TEST Not all test results are available during your visit. DR. Oneida Alar WILL CALL YOU WITHIN 14 DAYS OF YOUR PROCEDUE WITH YOUR RESULTS. Do not assume everything is normal if you have not heard from DR. Murlean Seelye, CALL HER OFFICE AT [phone].  SEEK IMMEDIATE MEDICAL ATTENTION AND CALL THE OFFICE: [phone] IF:  You have more than a spotting of blood in your stool.   Your belly is swollen (abdominal distention).   You are nauseated or vomiting.   You have a temperature over 101F.   You have abdominal pain or discomfort that is severe or gets worse throughout the day.   High-Fiber Diet A high-fiber diet changes your normal diet to include more whole grains, legumes, fruits, and vegetables. Changes in the diet involve replacing refined carbohydrates with unrefined foods. The calorie level of the diet is essentially unchanged. The Dietary Reference Intake (recommended amount) for adult males is 38 grams per day. For adult females, it is 25 grams per day. Pregnant and lactating women should consume 28 grams of fiber per day. Fiber is the intact part of a plant that is not broken down during digestion. Functional fiber is fiber that has been isolated from the plant to provide a beneficial effect in the body. PURPOSE  Increase stool bulk.   Ease and regulate bowel movements.   Lower cholesterol.   REDUCE RISK OF COLON  CANCER  INDICATIONS THAT YOU NEED MORE FIBER  Constipation and hemorrhoids.   Uncomplicated diverticulosis (intestine condition) and irritable bowel syndrome.   Weight management.   As a protective measure against hardening of the arteries (atherosclerosis), diabetes, and cancer.   GUIDELINES FOR INCREASING FIBER IN THE DIET  Start adding fiber to the diet slowly. A gradual increase of about 5 more grams (2 slices of whole-wheat bread, 2 servings of most fruits or vegetables, or 1 bowl of high-fiber cereal) per day is best. Too rapid  an increase in fiber may result in constipation, flatulence, and bloating.   Drink enough water and fluids to keep your urine clear or pale yellow. Water, juice, or caffeine-free drinks are recommended. Not drinking enough fluid may cause constipation.   Eat a variety of high-fiber foods rather than one type of fiber.   Try to increase your intake of fiber through using high-fiber foods rather than fiber pills or supplements that contain small amounts of fiber.   The goal is to change the types of food eaten. Do not supplement your present diet with high-fiber foods, but replace foods in your present diet.   INCLUDE A VARIETY OF FIBER SOURCES  Replace refined and processed grains with whole grains, canned fruits with fresh fruits, and incorporate other fiber sources. White rice, white breads, and most bakery goods contain little or no fiber.   Brown whole-grain rice, buckwheat oats, and many fruits and vegetables are all good sources of fiber. These include: broccoli, Brussels sprouts, cabbage, cauliflower, beets, sweet potatoes, white potatoes (skin on), carrots, tomatoes, eggplant, squash, berries, fresh fruits, and dried fruits.   Cereals appear to be the richest source of fiber. Cereal fiber is found in whole grains and bran. Bran is the fiber-rich outer coat of cereal grain, which is largely removed in refining. In whole-grain cereals, the bran remains. In breakfast cereals, the largest amount of fiber is found in those with "bran" in their names. The fiber content is sometimes indicated on the label.   You may need to include additional fruits and vegetables each day.   In baking, for 1 cup white flour, you may use the following substitutions:   1 cup whole-wheat flour minus 2 tablespoons.   1/2 cup white flour plus 1/2 cup whole-wheat flour.   Polyps, Colon  A polyp is extra tissue that grows inside your body. Colon polyps grow in the large intestine. The large intestine, also  called the colon, is part of your digestive system. It is a long, hollow tube at the end of your digestive tract where your body makes and stores stool. Most polyps are not dangerous. They are benign. This means they are not cancerous. But over time, some types of polyps can turn into cancer. Polyps that are smaller than a pea are usually not harmful. But larger polyps could someday become or may already be cancerous. To be safe, doctors remove all polyps and test them.   WHO GETS POLYPS? Anyone can get polyps, but certain people are more likely than others. You may have a greater chance of getting polyps if:  You are over 50.   You have had polyps before.   Someone in your family has had polyps.   Someone in your family has had cancer of the large intestine.   Find out if someone in your family has had polyps. You may also be more likely to get polyps if you:   Eat a lot of fatty foods  Smoke   Drink alcohol   Do not exercise  Eat too much   PREVENTION There is not one sure way to prevent polyps. You might be able to lower your risk of getting them if you:  Eat more fruits and vegetables and less fatty food.   Do not smoke.   Avoid alcohol.   Exercise every day.   Lose weight if you are overweight.   Eating more calcium and folate can also lower your risk of getting polyps. Some foods that are rich in calcium are milk, cheese, and broccoli. Some foods that are rich in folate are chickpeas, kidney beans, and spinach.   Hemorrhoids Hemorrhoids are dilated (enlarged) veins around the rectum. Sometimes clots will form in the veins. This makes them swollen and painful. These are called thrombosed hemorrhoids. Causes of hemorrhoids include:  Constipation.   Straining to have a bowel movement.   HEAVY LIFTING  HOME CARE INSTRUCTIONS  Eat a well balanced diet and drink 6 to 8 glasses of water every day to avoid constipation. You may also use a bulk laxative.   Avoid  straining to have bowel movements.   Keep anal area dry and clean.   Do not use a donut shaped pillow or sit on the toilet for long periods. This increases blood pooling and pain.   Move your bowels when your body has the urge; this will require less straining and will decrease pain and pressure.

## 2017-07-07 NOTE — Op Note (Signed)
[facility] Patient Name: Trevor [name] Procedure Date: 07/07/2017 7:57 AM MRN: [id number] Date of Birth: 02/03/1941 Attending MD: Barney Drain MD, MD CSN: [id number] Age: 77 Admit Type: Outpatient Procedure:                Colonoscopy WITH COLD FORCEPS/SNARE & SNARE CAUTERY                            POLYPECTOMY Indications:              Screening for colorectal malignant neoplasm Providers:                Barney Drain MD, MD, Janeece Riggers, RN, Aram Candela Referring MD:             Edwinna Areola. [place] Crane MD Medicines:                Meperidine 75 mg IV, Midazolam 5 mg IV Complications:            No immediate complications. Estimated Blood Loss:     Estimated blood loss was minimal. Procedure:                Pre-Anesthesia Assessment:                           - Prior to the procedure, a History and Physical                            was performed, and patient medications and                            allergies were reviewed. The patient's tolerance of                            previous anesthesia was also reviewed. The risks                            and benefits of the procedure and the sedation                            options and risks were discussed with the patient.                            All questions were answered, and informed consent                            was obtained. Prior Anticoagulants: The patient has                            taken no previous anticoagulant or antiplatelet                            agents. ASA Grade Assessment: II - A patient with                            mild systemic disease. After reviewing the risks  and benefits, the patient was deemed in                            satisfactory condition to undergo the procedure.                            After obtaining informed consent, the colonoscope                            was passed under direct vision. Throughout the                            procedure, the  patient's blood pressure, pulse, and                            oxygen saturations were monitored continuously. The                            EC-[id number] ([id number]) scope was introduced through                            the anus and advanced to the the cecum, identified                            by appendiceal orifice and ileocecal valve. The                            colonoscopy was somewhat difficult due to a                            tortuous colon. Successful completion of the                            procedure was aided by increasing the dose of                            sedation medication, straightening and shortening                            the scope to obtain bowel loop reduction and                            COLOWRAP. The patient tolerated the procedure                            fairly well. The quality of the bowel preparation                            was good. The ileocecal valve, appendiceal orifice,                            and rectum were photographed. Scope In: 8:52:46 AM Scope Out: 9:15:44 AM Scope Withdrawal Time: 0 hours 20 minutes 42 seconds  Total Procedure  Duration: 0 hours 22 minutes 58 seconds  Findings:      Two sessile polyps were found in the cecum. The polyps were 2 to 3 mm in       size. These polyps were removed with a cold biopsy forceps. Resection       and retrieval were complete.      Two sessile polyps were found in the ascending colon. The polyps were 3       to 4 mm in size. These polyps were removed with a cold snare. Resection       and retrieval were complete.      Two sessile polyps were found in the ascending colon. The polyps were 5       to 6 mm in size. These polyps were removed with a hot snare. Resection       and retrieval were complete.      External hemorrhoids were found during retroflexion. The hemorrhoids       were moderate.      The recto-sigmoid colon, sigmoid colon and descending colon revealed       moderately  excessive looping. Impression:               - Two 2 to 3 mm polyps in the cecum, removed with a                            cold biopsy forceps. Resected and retrieved.                           - Two 3 to 4 mm polyps in the ascending colon,                            removed with a cold snare. Resected and retrieved.                           - Two 5 to 6 mm polyps in the ascending colon,                            removed with a hot snare. Resected and retrieved.                           - External hemorrhoids.                           - There was significant looping of the LEFT colon. Moderate Sedation:      Moderate (conscious) sedation was administered by the endoscopy nurse       and supervised by the endoscopist. The following parameters were       monitored: oxygen saturation, heart rate, blood pressure, and response       to care. Total physician intraservice time was 35 minutes. Recommendation:           - High fiber diet.                           - Repeat colonoscopy in 3 - 5 years for  surveillance.                           - Continue present medications.                           - Await pathology results.                           - Patient has a contact number available for                            emergencies. The signs and symptoms of potential                            delayed complications were discussed with the                            patient. Return to normal activities tomorrow.                            Written discharge instructions were provided to the                            patient. Procedure Code(s):        --- Professional ---                           [phone], Colonoscopy, flexible; with removal of                            tumor(s), polyp(s), or other lesion(s) by snare                            technique                           45380, 59, Colonoscopy, flexible; with biopsy,                            single or  multiple                           99152, Moderate sedation services provided by the                            same physician or other qualified health care                            professional performing the diagnostic or                            therapeutic service that the sedation supports,                            requiring the presence of an independent trained  observer to assist in the monitoring of the                            patient's level of consciousness and physiological                            status; initial 15 minutes of intraservice time,                            patient age 30 years or older                           [phone], Moderate sedation services; each additional                            15 minutes intraservice time Diagnosis Code(s):        --- Professional ---                           Z12.11, Encounter for screening for malignant                            neoplasm of colon                           D12.0, Benign neoplasm of cecum                           D12.2, Benign neoplasm of ascending colon                           K64.4, Residual hemorrhoidal skin tags CPT copyright 2016 American Medical Association. All rights reserved. The codes documented in this report are preliminary and upon coder review may  be revised to meet current compliance requirements. Barney Drain, MD Barney Drain MD, MD 07/07/2017 9:30:07 AM This report has been signed electronically. Number of Addenda: 0

## 2017-07-11 ENCOUNTER — Telehealth: Payer: Self-pay | Admitting: Gastroenterology

## 2017-07-11 NOTE — Telephone Encounter (Signed)
Please call pt. HE had SIX simple adenomas removed. FOLLOW A HIGH FIBER DIET. NEXT TCS IN 3-5 YEARS.

## 2017-07-12 NOTE — Telephone Encounter (Signed)
Left message to call.

## 2017-07-12 NOTE — Telephone Encounter (Signed)
ON RECALL  °

## 2017-07-13 NOTE — Telephone Encounter (Signed)
Printed the info and mailed to pt.

## 2017-07-20 DIAGNOSIS — E782 Mixed hyperlipidemia: Secondary | ICD-10-CM | POA: Diagnosis not present

## 2017-07-20 DIAGNOSIS — J019 Acute sinusitis, unspecified: Secondary | ICD-10-CM | POA: Diagnosis not present

## 2017-07-20 DIAGNOSIS — I10 Essential (primary) hypertension: Secondary | ICD-10-CM | POA: Diagnosis not present

## 2017-07-20 DIAGNOSIS — G9009 Other idiopathic peripheral autonomic neuropathy: Secondary | ICD-10-CM | POA: Diagnosis not present

## 2017-07-20 DIAGNOSIS — K219 Gastro-esophageal reflux disease without esophagitis: Secondary | ICD-10-CM | POA: Diagnosis not present

## 2017-07-20 DIAGNOSIS — D631 Anemia in chronic kidney disease: Secondary | ICD-10-CM | POA: Diagnosis not present

## 2017-07-20 DIAGNOSIS — Z6837 Body mass index (BMI) 37.0-37.9, adult: Secondary | ICD-10-CM | POA: Diagnosis not present

## 2017-07-20 DIAGNOSIS — N184 Chronic kidney disease, stage 4 (severe): Secondary | ICD-10-CM | POA: Diagnosis not present

## 2017-07-20 DIAGNOSIS — E1122 Type 2 diabetes mellitus with diabetic chronic kidney disease: Secondary | ICD-10-CM | POA: Diagnosis not present

## 2017-07-21 ENCOUNTER — Encounter (HOSPITAL_COMMUNITY): Payer: Self-pay | Admitting: Gastroenterology

## 2017-08-18 DIAGNOSIS — N184 Chronic kidney disease, stage 4 (severe): Secondary | ICD-10-CM | POA: Diagnosis not present

## 2017-08-18 DIAGNOSIS — E889 Metabolic disorder, unspecified: Secondary | ICD-10-CM | POA: Diagnosis not present

## 2017-08-18 DIAGNOSIS — R809 Proteinuria, unspecified: Secondary | ICD-10-CM | POA: Diagnosis not present

## 2017-08-18 DIAGNOSIS — M9080 Osteopathy in diseases classified elsewhere, unspecified site: Secondary | ICD-10-CM | POA: Diagnosis not present

## 2017-08-18 DIAGNOSIS — I10 Essential (primary) hypertension: Secondary | ICD-10-CM | POA: Diagnosis not present

## 2017-08-18 DIAGNOSIS — E1129 Type 2 diabetes mellitus with other diabetic kidney complication: Secondary | ICD-10-CM | POA: Diagnosis not present

## 2017-09-03 DIAGNOSIS — R809 Proteinuria, unspecified: Secondary | ICD-10-CM | POA: Diagnosis not present

## 2017-09-03 DIAGNOSIS — N184 Chronic kidney disease, stage 4 (severe): Secondary | ICD-10-CM | POA: Diagnosis not present

## 2017-09-03 DIAGNOSIS — Z6838 Body mass index (BMI) 38.0-38.9, adult: Secondary | ICD-10-CM | POA: Diagnosis not present

## 2017-09-03 DIAGNOSIS — D631 Anemia in chronic kidney disease: Secondary | ICD-10-CM | POA: Diagnosis not present

## 2017-09-03 DIAGNOSIS — K635 Polyp of colon: Secondary | ICD-10-CM | POA: Diagnosis not present

## 2017-09-03 DIAGNOSIS — E1140 Type 2 diabetes mellitus with diabetic neuropathy, unspecified: Secondary | ICD-10-CM | POA: Diagnosis not present

## 2017-09-03 DIAGNOSIS — E782 Mixed hyperlipidemia: Secondary | ICD-10-CM | POA: Diagnosis not present

## 2017-09-03 DIAGNOSIS — I10 Essential (primary) hypertension: Secondary | ICD-10-CM | POA: Diagnosis not present

## 2017-12-03 DIAGNOSIS — D631 Anemia in chronic kidney disease: Secondary | ICD-10-CM | POA: Diagnosis not present

## 2017-12-03 DIAGNOSIS — I10 Essential (primary) hypertension: Secondary | ICD-10-CM | POA: Diagnosis not present

## 2017-12-03 DIAGNOSIS — N184 Chronic kidney disease, stage 4 (severe): Secondary | ICD-10-CM | POA: Diagnosis not present

## 2017-12-03 DIAGNOSIS — K635 Polyp of colon: Secondary | ICD-10-CM | POA: Diagnosis not present

## 2017-12-03 DIAGNOSIS — K219 Gastro-esophageal reflux disease without esophagitis: Secondary | ICD-10-CM | POA: Diagnosis not present

## 2017-12-03 DIAGNOSIS — E1122 Type 2 diabetes mellitus with diabetic chronic kidney disease: Secondary | ICD-10-CM | POA: Diagnosis not present

## 2017-12-03 DIAGNOSIS — E1140 Type 2 diabetes mellitus with diabetic neuropathy, unspecified: Secondary | ICD-10-CM | POA: Diagnosis not present

## 2017-12-03 DIAGNOSIS — E782 Mixed hyperlipidemia: Secondary | ICD-10-CM | POA: Diagnosis not present

## 2017-12-03 DIAGNOSIS — R809 Proteinuria, unspecified: Secondary | ICD-10-CM | POA: Diagnosis not present

## 2017-12-06 DIAGNOSIS — E1122 Type 2 diabetes mellitus with diabetic chronic kidney disease: Secondary | ICD-10-CM | POA: Diagnosis not present

## 2017-12-06 DIAGNOSIS — D631 Anemia in chronic kidney disease: Secondary | ICD-10-CM | POA: Diagnosis not present

## 2017-12-06 DIAGNOSIS — G9009 Other idiopathic peripheral autonomic neuropathy: Secondary | ICD-10-CM | POA: Diagnosis not present

## 2017-12-06 DIAGNOSIS — I10 Essential (primary) hypertension: Secondary | ICD-10-CM | POA: Diagnosis not present

## 2017-12-06 DIAGNOSIS — K635 Polyp of colon: Secondary | ICD-10-CM | POA: Diagnosis not present

## 2017-12-06 DIAGNOSIS — Z6838 Body mass index (BMI) 38.0-38.9, adult: Secondary | ICD-10-CM | POA: Diagnosis not present

## 2017-12-06 DIAGNOSIS — E782 Mixed hyperlipidemia: Secondary | ICD-10-CM | POA: Diagnosis not present

## 2017-12-06 DIAGNOSIS — N184 Chronic kidney disease, stage 4 (severe): Secondary | ICD-10-CM | POA: Diagnosis not present

## 2017-12-21 DIAGNOSIS — N184 Chronic kidney disease, stage 4 (severe): Secondary | ICD-10-CM | POA: Diagnosis not present

## 2017-12-21 DIAGNOSIS — R809 Proteinuria, unspecified: Secondary | ICD-10-CM | POA: Diagnosis not present

## 2017-12-21 DIAGNOSIS — Z79899 Other long term (current) drug therapy: Secondary | ICD-10-CM | POA: Diagnosis not present

## 2017-12-21 DIAGNOSIS — I10 Essential (primary) hypertension: Secondary | ICD-10-CM | POA: Diagnosis not present

## 2017-12-21 DIAGNOSIS — D509 Iron deficiency anemia, unspecified: Secondary | ICD-10-CM | POA: Diagnosis not present

## 2017-12-21 DIAGNOSIS — E559 Vitamin D deficiency, unspecified: Secondary | ICD-10-CM | POA: Diagnosis not present

## 2017-12-29 DIAGNOSIS — N184 Chronic kidney disease, stage 4 (severe): Secondary | ICD-10-CM | POA: Diagnosis not present

## 2017-12-29 DIAGNOSIS — E669 Obesity, unspecified: Secondary | ICD-10-CM | POA: Diagnosis not present

## 2017-12-29 DIAGNOSIS — E872 Acidosis: Secondary | ICD-10-CM | POA: Diagnosis not present

## 2017-12-29 DIAGNOSIS — D638 Anemia in other chronic diseases classified elsewhere: Secondary | ICD-10-CM | POA: Diagnosis not present

## 2017-12-29 DIAGNOSIS — R809 Proteinuria, unspecified: Secondary | ICD-10-CM | POA: Diagnosis not present

## 2018-03-18 DIAGNOSIS — E1140 Type 2 diabetes mellitus with diabetic neuropathy, unspecified: Secondary | ICD-10-CM | POA: Diagnosis not present

## 2018-03-18 DIAGNOSIS — E782 Mixed hyperlipidemia: Secondary | ICD-10-CM | POA: Diagnosis not present

## 2018-03-18 DIAGNOSIS — E1122 Type 2 diabetes mellitus with diabetic chronic kidney disease: Secondary | ICD-10-CM | POA: Diagnosis not present

## 2018-03-18 DIAGNOSIS — I10 Essential (primary) hypertension: Secondary | ICD-10-CM | POA: Diagnosis not present

## 2018-03-23 DIAGNOSIS — E1122 Type 2 diabetes mellitus with diabetic chronic kidney disease: Secondary | ICD-10-CM | POA: Diagnosis not present

## 2018-03-23 DIAGNOSIS — K635 Polyp of colon: Secondary | ICD-10-CM | POA: Diagnosis not present

## 2018-03-23 DIAGNOSIS — D631 Anemia in chronic kidney disease: Secondary | ICD-10-CM | POA: Diagnosis not present

## 2018-03-23 DIAGNOSIS — I10 Essential (primary) hypertension: Secondary | ICD-10-CM | POA: Diagnosis not present

## 2018-03-23 DIAGNOSIS — E1140 Type 2 diabetes mellitus with diabetic neuropathy, unspecified: Secondary | ICD-10-CM | POA: Diagnosis not present

## 2018-03-23 DIAGNOSIS — N184 Chronic kidney disease, stage 4 (severe): Secondary | ICD-10-CM | POA: Diagnosis not present

## 2018-03-23 DIAGNOSIS — Z23 Encounter for immunization: Secondary | ICD-10-CM | POA: Diagnosis not present

## 2018-03-23 DIAGNOSIS — E782 Mixed hyperlipidemia: Secondary | ICD-10-CM | POA: Diagnosis not present

## 2018-03-28 DIAGNOSIS — D509 Iron deficiency anemia, unspecified: Secondary | ICD-10-CM | POA: Diagnosis not present

## 2018-03-28 DIAGNOSIS — Z79899 Other long term (current) drug therapy: Secondary | ICD-10-CM | POA: Diagnosis not present

## 2018-03-28 DIAGNOSIS — N183 Chronic kidney disease, stage 3 (moderate): Secondary | ICD-10-CM | POA: Diagnosis not present

## 2018-03-28 DIAGNOSIS — R809 Proteinuria, unspecified: Secondary | ICD-10-CM | POA: Diagnosis not present

## 2018-03-28 DIAGNOSIS — I10 Essential (primary) hypertension: Secondary | ICD-10-CM | POA: Diagnosis not present

## 2018-03-28 DIAGNOSIS — E559 Vitamin D deficiency, unspecified: Secondary | ICD-10-CM | POA: Diagnosis not present

## 2018-04-04 DIAGNOSIS — N184 Chronic kidney disease, stage 4 (severe): Secondary | ICD-10-CM | POA: Diagnosis not present

## 2018-04-04 DIAGNOSIS — E872 Acidosis: Secondary | ICD-10-CM | POA: Diagnosis not present

## 2018-04-04 DIAGNOSIS — R809 Proteinuria, unspecified: Secondary | ICD-10-CM | POA: Diagnosis not present

## 2018-04-04 DIAGNOSIS — E559 Vitamin D deficiency, unspecified: Secondary | ICD-10-CM | POA: Diagnosis not present

## 2018-04-04 DIAGNOSIS — D638 Anemia in other chronic diseases classified elsewhere: Secondary | ICD-10-CM | POA: Diagnosis not present

## 2018-06-20 DIAGNOSIS — I10 Essential (primary) hypertension: Secondary | ICD-10-CM | POA: Diagnosis not present

## 2018-06-20 DIAGNOSIS — E1122 Type 2 diabetes mellitus with diabetic chronic kidney disease: Secondary | ICD-10-CM | POA: Diagnosis not present

## 2018-06-20 DIAGNOSIS — E782 Mixed hyperlipidemia: Secondary | ICD-10-CM | POA: Diagnosis not present

## 2018-06-20 DIAGNOSIS — E1140 Type 2 diabetes mellitus with diabetic neuropathy, unspecified: Secondary | ICD-10-CM | POA: Diagnosis not present

## 2018-06-20 DIAGNOSIS — D631 Anemia in chronic kidney disease: Secondary | ICD-10-CM | POA: Diagnosis not present

## 2018-06-20 DIAGNOSIS — N184 Chronic kidney disease, stage 4 (severe): Secondary | ICD-10-CM | POA: Diagnosis not present

## 2018-06-23 DIAGNOSIS — E1122 Type 2 diabetes mellitus with diabetic chronic kidney disease: Secondary | ICD-10-CM | POA: Diagnosis not present

## 2018-06-23 DIAGNOSIS — I10 Essential (primary) hypertension: Secondary | ICD-10-CM | POA: Diagnosis not present

## 2018-06-23 DIAGNOSIS — E782 Mixed hyperlipidemia: Secondary | ICD-10-CM | POA: Diagnosis not present

## 2018-06-23 DIAGNOSIS — R600 Localized edema: Secondary | ICD-10-CM | POA: Diagnosis not present

## 2018-06-23 DIAGNOSIS — N184 Chronic kidney disease, stage 4 (severe): Secondary | ICD-10-CM | POA: Diagnosis not present

## 2018-06-23 DIAGNOSIS — K635 Polyp of colon: Secondary | ICD-10-CM | POA: Diagnosis not present

## 2018-06-23 DIAGNOSIS — D631 Anemia in chronic kidney disease: Secondary | ICD-10-CM | POA: Diagnosis not present

## 2018-08-04 DIAGNOSIS — Z0000 Encounter for general adult medical examination without abnormal findings: Secondary | ICD-10-CM | POA: Diagnosis not present

## 2018-10-03 DIAGNOSIS — E1140 Type 2 diabetes mellitus with diabetic neuropathy, unspecified: Secondary | ICD-10-CM | POA: Diagnosis not present

## 2018-10-03 DIAGNOSIS — E782 Mixed hyperlipidemia: Secondary | ICD-10-CM | POA: Diagnosis not present

## 2018-10-03 DIAGNOSIS — I10 Essential (primary) hypertension: Secondary | ICD-10-CM | POA: Diagnosis not present

## 2018-10-03 DIAGNOSIS — D631 Anemia in chronic kidney disease: Secondary | ICD-10-CM | POA: Diagnosis not present

## 2018-10-03 DIAGNOSIS — E1122 Type 2 diabetes mellitus with diabetic chronic kidney disease: Secondary | ICD-10-CM | POA: Diagnosis not present

## 2018-10-06 DIAGNOSIS — R600 Localized edema: Secondary | ICD-10-CM | POA: Diagnosis not present

## 2018-10-06 DIAGNOSIS — D631 Anemia in chronic kidney disease: Secondary | ICD-10-CM | POA: Diagnosis not present

## 2018-10-06 DIAGNOSIS — E1122 Type 2 diabetes mellitus with diabetic chronic kidney disease: Secondary | ICD-10-CM | POA: Diagnosis not present

## 2018-10-06 DIAGNOSIS — N184 Chronic kidney disease, stage 4 (severe): Secondary | ICD-10-CM | POA: Diagnosis not present

## 2018-10-06 DIAGNOSIS — E782 Mixed hyperlipidemia: Secondary | ICD-10-CM | POA: Diagnosis not present

## 2018-10-06 DIAGNOSIS — Z86010 Personal history of colonic polyps: Secondary | ICD-10-CM | POA: Diagnosis not present

## 2018-10-06 DIAGNOSIS — E1121 Type 2 diabetes mellitus with diabetic nephropathy: Secondary | ICD-10-CM | POA: Diagnosis not present

## 2018-10-06 DIAGNOSIS — I10 Essential (primary) hypertension: Secondary | ICD-10-CM | POA: Diagnosis not present

## 2018-12-27 DIAGNOSIS — H524 Presbyopia: Secondary | ICD-10-CM | POA: Diagnosis not present

## 2019-01-09 DIAGNOSIS — I10 Essential (primary) hypertension: Secondary | ICD-10-CM | POA: Diagnosis not present

## 2019-01-09 DIAGNOSIS — D631 Anemia in chronic kidney disease: Secondary | ICD-10-CM | POA: Diagnosis not present

## 2019-01-09 DIAGNOSIS — E1121 Type 2 diabetes mellitus with diabetic nephropathy: Secondary | ICD-10-CM | POA: Diagnosis not present

## 2019-01-09 DIAGNOSIS — Z125 Encounter for screening for malignant neoplasm of prostate: Secondary | ICD-10-CM | POA: Diagnosis not present

## 2019-01-09 DIAGNOSIS — E1122 Type 2 diabetes mellitus with diabetic chronic kidney disease: Secondary | ICD-10-CM | POA: Diagnosis not present

## 2019-01-09 DIAGNOSIS — E1140 Type 2 diabetes mellitus with diabetic neuropathy, unspecified: Secondary | ICD-10-CM | POA: Diagnosis not present

## 2019-01-09 DIAGNOSIS — E782 Mixed hyperlipidemia: Secondary | ICD-10-CM | POA: Diagnosis not present

## 2019-01-12 DIAGNOSIS — D631 Anemia in chronic kidney disease: Secondary | ICD-10-CM | POA: Diagnosis not present

## 2019-01-12 DIAGNOSIS — E782 Mixed hyperlipidemia: Secondary | ICD-10-CM | POA: Diagnosis not present

## 2019-01-12 DIAGNOSIS — R600 Localized edema: Secondary | ICD-10-CM | POA: Diagnosis not present

## 2019-01-12 DIAGNOSIS — E1122 Type 2 diabetes mellitus with diabetic chronic kidney disease: Secondary | ICD-10-CM | POA: Diagnosis not present

## 2019-01-12 DIAGNOSIS — N184 Chronic kidney disease, stage 4 (severe): Secondary | ICD-10-CM | POA: Diagnosis not present

## 2019-01-12 DIAGNOSIS — E1121 Type 2 diabetes mellitus with diabetic nephropathy: Secondary | ICD-10-CM | POA: Diagnosis not present

## 2019-01-12 DIAGNOSIS — I10 Essential (primary) hypertension: Secondary | ICD-10-CM | POA: Diagnosis not present

## 2019-01-12 DIAGNOSIS — Z86010 Personal history of colonic polyps: Secondary | ICD-10-CM | POA: Diagnosis not present

## 2019-03-02 DIAGNOSIS — Z23 Encounter for immunization: Secondary | ICD-10-CM | POA: Diagnosis not present

## 2019-04-27 DIAGNOSIS — I10 Essential (primary) hypertension: Secondary | ICD-10-CM | POA: Diagnosis not present

## 2019-04-27 DIAGNOSIS — D631 Anemia in chronic kidney disease: Secondary | ICD-10-CM | POA: Diagnosis not present

## 2019-04-27 DIAGNOSIS — E1121 Type 2 diabetes mellitus with diabetic nephropathy: Secondary | ICD-10-CM | POA: Diagnosis not present

## 2019-04-27 DIAGNOSIS — E1122 Type 2 diabetes mellitus with diabetic chronic kidney disease: Secondary | ICD-10-CM | POA: Diagnosis not present

## 2019-04-27 DIAGNOSIS — E559 Vitamin D deficiency, unspecified: Secondary | ICD-10-CM | POA: Diagnosis not present

## 2019-04-27 DIAGNOSIS — E782 Mixed hyperlipidemia: Secondary | ICD-10-CM | POA: Diagnosis not present

## 2019-04-27 DIAGNOSIS — E1140 Type 2 diabetes mellitus with diabetic neuropathy, unspecified: Secondary | ICD-10-CM | POA: Diagnosis not present

## 2019-05-01 DIAGNOSIS — R0789 Other chest pain: Secondary | ICD-10-CM | POA: Diagnosis not present

## 2019-05-01 DIAGNOSIS — E1122 Type 2 diabetes mellitus with diabetic chronic kidney disease: Secondary | ICD-10-CM | POA: Diagnosis not present

## 2019-05-01 DIAGNOSIS — R600 Localized edema: Secondary | ICD-10-CM | POA: Diagnosis not present

## 2019-05-01 DIAGNOSIS — N184 Chronic kidney disease, stage 4 (severe): Secondary | ICD-10-CM | POA: Diagnosis not present

## 2019-05-01 DIAGNOSIS — Z86010 Personal history of colonic polyps: Secondary | ICD-10-CM | POA: Diagnosis not present

## 2019-05-01 DIAGNOSIS — E1121 Type 2 diabetes mellitus with diabetic nephropathy: Secondary | ICD-10-CM | POA: Diagnosis not present

## 2019-05-01 DIAGNOSIS — I10 Essential (primary) hypertension: Secondary | ICD-10-CM | POA: Diagnosis not present

## 2019-05-01 DIAGNOSIS — D631 Anemia in chronic kidney disease: Secondary | ICD-10-CM | POA: Diagnosis not present

## 2019-05-01 DIAGNOSIS — E782 Mixed hyperlipidemia: Secondary | ICD-10-CM | POA: Diagnosis not present

## 2019-05-16 DIAGNOSIS — N184 Chronic kidney disease, stage 4 (severe): Secondary | ICD-10-CM | POA: Insufficient documentation

## 2019-05-18 DIAGNOSIS — E1129 Type 2 diabetes mellitus with other diabetic kidney complication: Secondary | ICD-10-CM | POA: Diagnosis not present

## 2019-05-18 DIAGNOSIS — E872 Acidosis: Secondary | ICD-10-CM | POA: Diagnosis not present

## 2019-05-18 DIAGNOSIS — N170 Acute kidney failure with tubular necrosis: Secondary | ICD-10-CM | POA: Diagnosis not present

## 2019-05-18 DIAGNOSIS — R809 Proteinuria, unspecified: Secondary | ICD-10-CM | POA: Diagnosis not present

## 2019-05-18 DIAGNOSIS — N189 Chronic kidney disease, unspecified: Secondary | ICD-10-CM | POA: Diagnosis not present

## 2019-05-18 DIAGNOSIS — R600 Localized edema: Secondary | ICD-10-CM | POA: Diagnosis not present

## 2019-05-18 DIAGNOSIS — E1122 Type 2 diabetes mellitus with diabetic chronic kidney disease: Secondary | ICD-10-CM | POA: Diagnosis not present

## 2019-05-18 DIAGNOSIS — E211 Secondary hyperparathyroidism, not elsewhere classified: Secondary | ICD-10-CM | POA: Diagnosis not present

## 2019-05-18 DIAGNOSIS — E559 Vitamin D deficiency, unspecified: Secondary | ICD-10-CM | POA: Diagnosis not present

## 2019-05-31 DIAGNOSIS — I10 Essential (primary) hypertension: Secondary | ICD-10-CM | POA: Diagnosis not present

## 2019-05-31 DIAGNOSIS — Z0000 Encounter for general adult medical examination without abnormal findings: Secondary | ICD-10-CM | POA: Diagnosis not present

## 2019-05-31 DIAGNOSIS — N184 Chronic kidney disease, stage 4 (severe): Secondary | ICD-10-CM | POA: Diagnosis not present

## 2019-05-31 DIAGNOSIS — E1122 Type 2 diabetes mellitus with diabetic chronic kidney disease: Secondary | ICD-10-CM | POA: Diagnosis not present

## 2019-05-31 DIAGNOSIS — K219 Gastro-esophageal reflux disease without esophagitis: Secondary | ICD-10-CM | POA: Diagnosis not present

## 2019-05-31 DIAGNOSIS — R809 Proteinuria, unspecified: Secondary | ICD-10-CM | POA: Diagnosis not present

## 2019-05-31 DIAGNOSIS — D631 Anemia in chronic kidney disease: Secondary | ICD-10-CM | POA: Diagnosis not present

## 2019-05-31 DIAGNOSIS — E1140 Type 2 diabetes mellitus with diabetic neuropathy, unspecified: Secondary | ICD-10-CM | POA: Diagnosis not present

## 2019-05-31 DIAGNOSIS — E782 Mixed hyperlipidemia: Secondary | ICD-10-CM | POA: Diagnosis not present

## 2019-06-02 DIAGNOSIS — R600 Localized edema: Secondary | ICD-10-CM | POA: Diagnosis not present

## 2019-06-02 DIAGNOSIS — N189 Chronic kidney disease, unspecified: Secondary | ICD-10-CM | POA: Diagnosis not present

## 2019-06-02 DIAGNOSIS — E872 Acidosis: Secondary | ICD-10-CM | POA: Diagnosis not present

## 2019-06-02 DIAGNOSIS — R809 Proteinuria, unspecified: Secondary | ICD-10-CM | POA: Diagnosis not present

## 2019-06-02 DIAGNOSIS — E559 Vitamin D deficiency, unspecified: Secondary | ICD-10-CM | POA: Diagnosis not present

## 2019-06-02 DIAGNOSIS — N170 Acute kidney failure with tubular necrosis: Secondary | ICD-10-CM | POA: Diagnosis not present

## 2019-06-02 DIAGNOSIS — Z79899 Other long term (current) drug therapy: Secondary | ICD-10-CM | POA: Diagnosis not present

## 2019-06-02 DIAGNOSIS — E1129 Type 2 diabetes mellitus with other diabetic kidney complication: Secondary | ICD-10-CM | POA: Diagnosis not present

## 2019-06-02 DIAGNOSIS — E1122 Type 2 diabetes mellitus with diabetic chronic kidney disease: Secondary | ICD-10-CM | POA: Diagnosis not present

## 2019-06-02 DIAGNOSIS — E211 Secondary hyperparathyroidism, not elsewhere classified: Secondary | ICD-10-CM | POA: Diagnosis not present

## 2019-06-08 DIAGNOSIS — E1122 Type 2 diabetes mellitus with diabetic chronic kidney disease: Secondary | ICD-10-CM | POA: Diagnosis not present

## 2019-06-08 DIAGNOSIS — R600 Localized edema: Secondary | ICD-10-CM | POA: Diagnosis not present

## 2019-06-08 DIAGNOSIS — E211 Secondary hyperparathyroidism, not elsewhere classified: Secondary | ICD-10-CM | POA: Diagnosis not present

## 2019-06-08 DIAGNOSIS — R809 Proteinuria, unspecified: Secondary | ICD-10-CM | POA: Diagnosis not present

## 2019-06-08 DIAGNOSIS — N189 Chronic kidney disease, unspecified: Secondary | ICD-10-CM | POA: Diagnosis not present

## 2019-06-08 DIAGNOSIS — Z79899 Other long term (current) drug therapy: Secondary | ICD-10-CM | POA: Diagnosis not present

## 2019-06-08 DIAGNOSIS — E1129 Type 2 diabetes mellitus with other diabetic kidney complication: Secondary | ICD-10-CM | POA: Diagnosis not present

## 2019-06-08 DIAGNOSIS — E872 Acidosis: Secondary | ICD-10-CM | POA: Diagnosis not present

## 2019-06-08 DIAGNOSIS — E875 Hyperkalemia: Secondary | ICD-10-CM | POA: Diagnosis not present

## 2019-07-03 DIAGNOSIS — H40013 Open angle with borderline findings, low risk, bilateral: Secondary | ICD-10-CM | POA: Diagnosis not present

## 2019-07-05 DIAGNOSIS — E559 Vitamin D deficiency, unspecified: Secondary | ICD-10-CM | POA: Diagnosis not present

## 2019-07-05 DIAGNOSIS — N170 Acute kidney failure with tubular necrosis: Secondary | ICD-10-CM | POA: Diagnosis not present

## 2019-07-05 DIAGNOSIS — R809 Proteinuria, unspecified: Secondary | ICD-10-CM | POA: Diagnosis not present

## 2019-07-05 DIAGNOSIS — E1129 Type 2 diabetes mellitus with other diabetic kidney complication: Secondary | ICD-10-CM | POA: Diagnosis not present

## 2019-07-05 DIAGNOSIS — R600 Localized edema: Secondary | ICD-10-CM | POA: Diagnosis not present

## 2019-07-05 DIAGNOSIS — E1122 Type 2 diabetes mellitus with diabetic chronic kidney disease: Secondary | ICD-10-CM | POA: Diagnosis not present

## 2019-07-05 DIAGNOSIS — N189 Chronic kidney disease, unspecified: Secondary | ICD-10-CM | POA: Diagnosis not present

## 2019-07-05 DIAGNOSIS — E872 Acidosis: Secondary | ICD-10-CM | POA: Diagnosis not present

## 2019-07-05 DIAGNOSIS — E211 Secondary hyperparathyroidism, not elsewhere classified: Secondary | ICD-10-CM | POA: Diagnosis not present

## 2019-07-07 DIAGNOSIS — R809 Proteinuria, unspecified: Secondary | ICD-10-CM | POA: Diagnosis not present

## 2019-07-07 DIAGNOSIS — E875 Hyperkalemia: Secondary | ICD-10-CM | POA: Diagnosis not present

## 2019-07-07 DIAGNOSIS — R600 Localized edema: Secondary | ICD-10-CM | POA: Diagnosis not present

## 2019-07-07 DIAGNOSIS — E872 Acidosis: Secondary | ICD-10-CM | POA: Diagnosis not present

## 2019-07-07 DIAGNOSIS — N185 Chronic kidney disease, stage 5: Secondary | ICD-10-CM | POA: Diagnosis not present

## 2019-07-07 DIAGNOSIS — E1129 Type 2 diabetes mellitus with other diabetic kidney complication: Secondary | ICD-10-CM | POA: Diagnosis not present

## 2019-07-07 DIAGNOSIS — E211 Secondary hyperparathyroidism, not elsewhere classified: Secondary | ICD-10-CM | POA: Diagnosis not present

## 2019-07-07 DIAGNOSIS — E1122 Type 2 diabetes mellitus with diabetic chronic kidney disease: Secondary | ICD-10-CM | POA: Diagnosis not present

## 2019-07-26 ENCOUNTER — Other Ambulatory Visit: Payer: Self-pay | Admitting: *Deleted

## 2019-07-26 DIAGNOSIS — N186 End stage renal disease: Secondary | ICD-10-CM

## 2019-07-27 DIAGNOSIS — E1121 Type 2 diabetes mellitus with diabetic nephropathy: Secondary | ICD-10-CM | POA: Diagnosis not present

## 2019-07-27 DIAGNOSIS — E1122 Type 2 diabetes mellitus with diabetic chronic kidney disease: Secondary | ICD-10-CM | POA: Diagnosis not present

## 2019-07-27 DIAGNOSIS — J019 Acute sinusitis, unspecified: Secondary | ICD-10-CM | POA: Diagnosis not present

## 2019-07-27 DIAGNOSIS — Z0000 Encounter for general adult medical examination without abnormal findings: Secondary | ICD-10-CM | POA: Diagnosis not present

## 2019-07-27 DIAGNOSIS — G9009 Other idiopathic peripheral autonomic neuropathy: Secondary | ICD-10-CM | POA: Diagnosis not present

## 2019-07-27 DIAGNOSIS — E1140 Type 2 diabetes mellitus with diabetic neuropathy, unspecified: Secondary | ICD-10-CM | POA: Diagnosis not present

## 2019-07-27 DIAGNOSIS — K219 Gastro-esophageal reflux disease without esophagitis: Secondary | ICD-10-CM | POA: Diagnosis not present

## 2019-07-27 DIAGNOSIS — I10 Essential (primary) hypertension: Secondary | ICD-10-CM | POA: Diagnosis not present

## 2019-07-27 DIAGNOSIS — E782 Mixed hyperlipidemia: Secondary | ICD-10-CM | POA: Diagnosis not present

## 2019-08-02 DIAGNOSIS — R600 Localized edema: Secondary | ICD-10-CM | POA: Diagnosis not present

## 2019-08-02 DIAGNOSIS — Z86010 Personal history of colonic polyps: Secondary | ICD-10-CM | POA: Diagnosis not present

## 2019-08-02 DIAGNOSIS — E1121 Type 2 diabetes mellitus with diabetic nephropathy: Secondary | ICD-10-CM | POA: Diagnosis not present

## 2019-08-02 DIAGNOSIS — I10 Essential (primary) hypertension: Secondary | ICD-10-CM | POA: Diagnosis not present

## 2019-08-02 DIAGNOSIS — N184 Chronic kidney disease, stage 4 (severe): Secondary | ICD-10-CM | POA: Diagnosis not present

## 2019-08-02 DIAGNOSIS — E1122 Type 2 diabetes mellitus with diabetic chronic kidney disease: Secondary | ICD-10-CM | POA: Diagnosis not present

## 2019-08-02 DIAGNOSIS — D631 Anemia in chronic kidney disease: Secondary | ICD-10-CM | POA: Diagnosis not present

## 2019-08-02 DIAGNOSIS — E782 Mixed hyperlipidemia: Secondary | ICD-10-CM | POA: Diagnosis not present

## 2019-08-02 DIAGNOSIS — R0789 Other chest pain: Secondary | ICD-10-CM | POA: Diagnosis not present

## 2019-08-03 ENCOUNTER — Telehealth (HOSPITAL_COMMUNITY): Payer: Self-pay

## 2019-08-03 NOTE — Telephone Encounter (Signed)

## 2019-08-04 ENCOUNTER — Other Ambulatory Visit: Payer: Self-pay

## 2019-08-04 ENCOUNTER — Ambulatory Visit (INDEPENDENT_AMBULATORY_CARE_PROVIDER_SITE_OTHER)
Admission: RE | Admit: 2019-08-04 | Discharge: 2019-08-04 | Disposition: A | Discharge status: Home / Self Care | Payer: Medicare HMO | Source: Ambulatory Visit | Attending: Vascular Surgery | Admitting: Vascular Surgery

## 2019-08-04 ENCOUNTER — Ambulatory Visit (HOSPITAL_COMMUNITY)
Admission: RE | Admit: 2019-08-04 | Discharge: 2019-08-04 | Disposition: A | Discharge status: Home / Self Care | Payer: Medicare HMO | Source: Ambulatory Visit | Attending: Vascular Surgery | Admitting: Vascular Surgery

## 2019-08-04 ENCOUNTER — Ambulatory Visit (INDEPENDENT_AMBULATORY_CARE_PROVIDER_SITE_OTHER): Payer: Medicare HMO | Admitting: Vascular Surgery

## 2019-08-04 ENCOUNTER — Encounter: Payer: Self-pay | Admitting: Vascular Surgery

## 2019-08-04 VITALS — BP 133/79 | HR 66 | Temp 97.2°F | Resp 20 | Ht 70.0 in | Wt 231.0 lb

## 2019-08-04 DIAGNOSIS — N186 End stage renal disease: Secondary | ICD-10-CM | POA: Diagnosis not present

## 2019-08-04 DIAGNOSIS — N185 Chronic kidney disease, stage 5: Secondary | ICD-10-CM | POA: Diagnosis not present

## 2019-08-04 NOTE — Progress Notes (Signed)
Patient informed of correct arrival time of 5:30am on 08/09/19. Patient verbalized understanding. Minette Brine, RN
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
## 2019-08-07 ENCOUNTER — Other Ambulatory Visit: Payer: Self-pay

## 2019-08-07 ENCOUNTER — Other Ambulatory Visit (HOSPITAL_COMMUNITY)
Admission: RE | Admit: 2019-08-07 | Discharge: 2019-08-07 | Disposition: A | Discharge status: Home / Self Care | Payer: Medicare HMO | Source: Ambulatory Visit | Attending: Vascular Surgery | Admitting: Vascular Surgery

## 2019-08-07 DIAGNOSIS — Z01812 Encounter for preprocedural laboratory examination: Secondary | ICD-10-CM | POA: Insufficient documentation

## 2019-08-07 DIAGNOSIS — Z20822 Contact with and (suspected) exposure to covid-19: Secondary | ICD-10-CM | POA: Diagnosis not present

## 2019-08-07 LAB — SARS CORONAVIRUS 2 (TAT 6-24 HRS): SARS Coronavirus 2: NEGATIVE

## 2019-08-08 ENCOUNTER — Other Ambulatory Visit: Payer: Self-pay

## 2019-08-08 ENCOUNTER — Encounter (HOSPITAL_COMMUNITY): Payer: Self-pay | Admitting: Vascular Surgery

## 2019-08-08 NOTE — Progress Notes (Signed)
Dr. Juel Burrow office called and stated that they could not find an EKG that had been done there. His last A1C was 6.1 on 08/02/19.

## 2019-08-08 NOTE — Anesthesia Preprocedure Evaluation (Addendum)
Anesthesia Evaluation  Patient identified by MRN, date of birth, ID band Patient awake    Reviewed: Allergy & Precautions, NPO status , Patient's Chart, lab work & pertinent test results  Airway Mallampati: II  TM Distance: >3 FB Neck ROM: Full    Dental  (+) Missing   Pulmonary neg pulmonary ROS,    Pulmonary exam normal breath sounds clear to auscultation       Cardiovascular hypertension, Pt. on medications Normal cardiovascular exam Rhythm:Regular Rate:Normal  ECG: SB, rate 57. Sinus bradycardia with 1st degree A-V block   Neuro/Psych negative neurological ROS  negative psych ROS   GI/Hepatic negative GI ROS, (+) Hepatitis -  Endo/Other  diabetes  Renal/GU ESRFRenal disease     Musculoskeletal negative musculoskeletal ROS (+)   Abdominal (+) + obese,   Peds  Hematology  (+) anemia , HLD   Anesthesia Other Findings END STAGE RENAL DISEASE  Reproductive/Obstetrics                            Anesthesia Physical Anesthesia Plan  ASA: III  Anesthesia Plan: General   Post-op Pain Management:    Induction: Intravenous  PONV Risk Score and Plan: 2 and Ondansetron, Dexamethasone and Treatment may vary due to age or medical condition  Airway Management Planned: LMA  Additional Equipment:   Intra-op Plan:   Post-operative Plan: Extubation in OR  Informed Consent: I have reviewed the patients History and Physical, chart, labs and discussed the procedure including the risks, benefits and alternatives for the proposed anesthesia with the patient or authorized representative who has indicated his/her understanding and acceptance.     Dental advisory given  Plan Discussed with: CRNA  Anesthesia Plan Comments:        Anesthesia Quick Evaluation

## 2019-08-08 NOTE — Progress Notes (Signed)
Spoke with pt for pre-op call. Pt denies cardiac history, states he is treated for HTN and Type 2 Diabetes. Pt can't remember when his last A1C was and he does not check his blood sugar in the AM. He checks it at night. He only takes Actos and [place] Insulin as needed. He stated that his blood sugar was 200 yesterday evening so he took 20 units of the [place]. I instructed him to take 1/2 of the [place] tonight if his blood sugar is what he would normally choose to use the [place]. He will take 10 units if he needs it.   Covid test done 08/07/19 and it's negative. Pt states he's been in quarantine since the test was done and understands that he needs to continue quarantine until he comes to the hospital in the AM.   I have called Dr. Josue Hector office and requested pt's most recent A1C and an EKG if they have one.

## 2019-08-09 ENCOUNTER — Ambulatory Visit (HOSPITAL_COMMUNITY): Payer: Medicare HMO | Admitting: Anesthesiology

## 2019-08-09 ENCOUNTER — Encounter (HOSPITAL_COMMUNITY): Payer: Self-pay | Admitting: Vascular Surgery

## 2019-08-09 ENCOUNTER — Encounter (HOSPITAL_COMMUNITY)
Admission: RE | Disposition: A | Discharge status: Home / Self Care | Payer: Self-pay | Source: Home / Self Care | Attending: Vascular Surgery

## 2019-08-09 ENCOUNTER — Other Ambulatory Visit: Payer: Self-pay

## 2019-08-09 ENCOUNTER — Ambulatory Visit (HOSPITAL_COMMUNITY)
Admission: RE | Admit: 2019-08-09 | Discharge: 2019-08-09 | Disposition: A | Discharge status: Home / Self Care | Payer: Medicare HMO | Attending: Vascular Surgery | Admitting: Vascular Surgery

## 2019-08-09 DIAGNOSIS — E1122 Type 2 diabetes mellitus with diabetic chronic kidney disease: Secondary | ICD-10-CM | POA: Insufficient documentation

## 2019-08-09 DIAGNOSIS — Z794 Long term (current) use of insulin: Secondary | ICD-10-CM | POA: Insufficient documentation

## 2019-08-09 DIAGNOSIS — Z79899 Other long term (current) drug therapy: Secondary | ICD-10-CM | POA: Diagnosis not present

## 2019-08-09 DIAGNOSIS — I120 Hypertensive chronic kidney disease with stage 5 chronic kidney disease or end stage renal disease: Secondary | ICD-10-CM | POA: Insufficient documentation

## 2019-08-09 DIAGNOSIS — N185 Chronic kidney disease, stage 5: Secondary | ICD-10-CM | POA: Insufficient documentation

## 2019-08-09 DIAGNOSIS — Z7982 Long term (current) use of aspirin: Secondary | ICD-10-CM | POA: Insufficient documentation

## 2019-08-09 DIAGNOSIS — N189 Chronic kidney disease, unspecified: Secondary | ICD-10-CM

## 2019-08-09 DIAGNOSIS — D631 Anemia in chronic kidney disease: Secondary | ICD-10-CM | POA: Diagnosis not present

## 2019-08-09 DIAGNOSIS — N186 End stage renal disease: Secondary | ICD-10-CM | POA: Diagnosis not present

## 2019-08-09 DIAGNOSIS — E785 Hyperlipidemia, unspecified: Secondary | ICD-10-CM | POA: Diagnosis not present

## 2019-08-09 HISTORY — PX: AV FISTULA PLACEMENT: SHX1204

## 2019-08-09 HISTORY — DX: Inflammatory liver disease, unspecified: K75.9

## 2019-08-09 LAB — POCT I-STAT, CHEM 8
BUN: 56 mg/dL — ABNORMAL HIGH (ref 8–23)
Calcium, Ion: 1.23 mmol/L (ref 1.15–1.40)
Chloride: 102 mmol/L (ref 98–111)
Creatinine, Ser: 3.9 mg/dL — ABNORMAL HIGH (ref 0.61–1.24)
Glucose, Bld: 124 mg/dL — ABNORMAL HIGH (ref 70–99)
HCT: 32 % — ABNORMAL LOW (ref 39.0–52.0)
Hemoglobin: 10.9 g/dL — ABNORMAL LOW (ref 13.0–17.0)
Potassium: 4.2 mmol/L (ref 3.5–5.1)
Sodium: 140 mmol/L (ref 135–145)
TCO2: 26 mmol/L (ref 22–32)

## 2019-08-09 LAB — GLUCOSE, CAPILLARY
Glucose-Capillary: 119 mg/dL — ABNORMAL HIGH (ref 70–99)
Glucose-Capillary: 117 mg/dL — ABNORMAL HIGH (ref 70–99)

## 2019-08-09 SURGERY — ARTERIOVENOUS (AV) FISTULA CREATION
Anesthesia: General | Site: Arm Upper | Laterality: Left

## 2019-08-09 MED ORDER — CHLORHEXIDINE GLUCONATE 4 % EX LIQD: 60.0000 mL | Freq: Once | CUTANEOUS | Status: DC

## 2019-08-09 MED ORDER — PROPOFOL 10 MG/ML IV BOLUS
INTRAVENOUS | Status: DC | PRN
  Administered 2019-08-09: 200 mg via INTRAVENOUS

## 2019-08-09 MED ORDER — FENTANYL CITRATE (PF) 250 MCG/5ML IJ SOLN
INTRAMUSCULAR | Status: AC
  Filled 2019-08-09: qty 5

## 2019-08-09 MED ORDER — SODIUM CHLORIDE 0.9 % IV SOLN: INTRAVENOUS | Status: DC

## 2019-08-09 MED ORDER — ACETAMINOPHEN 500 MG PO TABS
1000.0000 mg | ORAL_TABLET | Freq: Once | ORAL | Status: AC
  Administered 2019-08-09: 07:00:00 1000 mg via ORAL
  Filled 2019-08-09: qty 2

## 2019-08-09 MED ORDER — DEXAMETHASONE SODIUM PHOSPHATE 10 MG/ML IJ SOLN
INTRAMUSCULAR | Status: AC
  Filled 2019-08-09: qty 1

## 2019-08-09 MED ORDER — FENTANYL CITRATE (PF) 100 MCG/2ML IJ SOLN: 25.0000 ug | INTRAMUSCULAR | Status: DC | PRN

## 2019-08-09 MED ORDER — LIDOCAINE 2% (20 MG/ML) 5 ML SYRINGE
INTRAMUSCULAR | Status: DC | PRN
  Administered 2019-08-09: 60 mg via INTRAVENOUS

## 2019-08-09 MED ORDER — DEXAMETHASONE SODIUM PHOSPHATE 10 MG/ML IJ SOLN
INTRAMUSCULAR | Status: DC | PRN
  Administered 2019-08-09: 4 mg via INTRAVENOUS

## 2019-08-09 MED ORDER — SODIUM CHLORIDE 0.9 % IV SOLN
INTRAVENOUS | Status: DC | PRN
  Administered 2019-08-09: 500 mL

## 2019-08-09 MED ORDER — ONDANSETRON HCL 4 MG/2ML IJ SOLN
INTRAMUSCULAR | Status: DC | PRN
  Administered 2019-08-09: 4 mg via INTRAVENOUS

## 2019-08-09 MED ORDER — SODIUM CHLORIDE 0.9 % IV SOLN
INTRAVENOUS | Status: AC
  Filled 2019-08-09: qty 1.2

## 2019-08-09 MED ORDER — CEFAZOLIN SODIUM-DEXTROSE 2-4 GM/100ML-% IV SOLN
INTRAVENOUS | Status: AC
  Filled 2019-08-09: qty 100

## 2019-08-09 MED ORDER — EPHEDRINE SULFATE-NACL 50-0.9 MG/10ML-% IV SOSY
PREFILLED_SYRINGE | INTRAVENOUS | Status: DC | PRN
  Administered 2019-08-09: 10 mg via INTRAVENOUS
  Administered 2019-08-09 (×3): 5 mg via INTRAVENOUS

## 2019-08-09 MED ORDER — LACTATED RINGERS IV SOLN: INTRAVENOUS | Status: DC | PRN

## 2019-08-09 MED ORDER — EPHEDRINE 5 MG/ML INJ
INTRAVENOUS | Status: AC
  Filled 2019-08-09: qty 10

## 2019-08-09 MED ORDER — 0.9 % SODIUM CHLORIDE (POUR BTL) OPTIME
TOPICAL | Status: DC | PRN
  Administered 2019-08-09: 08:00:00 1000 mL

## 2019-08-09 MED ORDER — ONDANSETRON HCL 4 MG/2ML IJ SOLN
INTRAMUSCULAR | Status: AC
  Filled 2019-08-09: qty 2

## 2019-08-09 MED ORDER — PROPOFOL 10 MG/ML IV BOLUS
INTRAVENOUS | Status: AC
  Filled 2019-08-09: qty 20

## 2019-08-09 MED ORDER — OXYCODONE-ACETAMINOPHEN 5-325 MG PO TABS: 1.0000 | ORAL_TABLET | Freq: Four times a day (QID) | ORAL | 0 refills | Status: DC | PRN

## 2019-08-09 MED ORDER — CEFAZOLIN SODIUM-DEXTROSE 2-4 GM/100ML-% IV SOLN
2.0000 g | INTRAVENOUS | Status: AC
  Administered 2019-08-09: 08:00:00 2 g via INTRAVENOUS

## 2019-08-09 MED ORDER — LIDOCAINE HCL (PF) 1 % IJ SOLN
INTRAMUSCULAR | Status: AC
  Filled 2019-08-09: qty 30

## 2019-08-09 MED ORDER — LIDOCAINE HCL 1 % IJ SOLN
INTRAMUSCULAR | Status: DC | PRN
  Administered 2019-08-09: 30 mL

## 2019-08-09 MED ORDER — LIDOCAINE 2% (20 MG/ML) 5 ML SYRINGE
INTRAMUSCULAR | Status: AC
  Filled 2019-08-09: qty 5

## 2019-08-09 MED ORDER — ONDANSETRON HCL 4 MG/2ML IJ SOLN: 4.0000 mg | Freq: Once | INTRAMUSCULAR | Status: DC | PRN

## 2019-08-09 SURGICAL SUPPLY — 35 items
ARMBAND PINK RESTRICT EXTREMIT (MISCELLANEOUS) ×3 IMPLANT
CANISTER SUCT 3000ML PPV (MISCELLANEOUS) ×3 IMPLANT
CLIP VESOCCLUDE MED 6/CT (CLIP) ×3 IMPLANT
CLIP VESOCCLUDE SM WIDE 6/CT (CLIP) ×3 IMPLANT
COVER PROBE W GEL 5X96 (DRAPES) IMPLANT
COVER WAND RF STERILE (DRAPES) IMPLANT
DERMABOND ADVANCED (GAUZE/BANDAGES/DRESSINGS) ×2
DERMABOND ADVANCED .7 DNX12 (GAUZE/BANDAGES/DRESSINGS) ×1 IMPLANT
ELECT REM PT RETURN 9FT ADLT (ELECTROSURGICAL) ×3
ELECTRODE REM PT RTRN 9FT ADLT (ELECTROSURGICAL) ×1 IMPLANT
GLOVE BIO SURGEON STRL SZ 6.5 (GLOVE) ×4 IMPLANT
GLOVE BIO SURGEON STRL SZ7 (GLOVE) ×3 IMPLANT
GLOVE BIO SURGEON STRL SZ7.5 (GLOVE) ×3 IMPLANT
GLOVE BIO SURGEONS STRL SZ 6.5 (GLOVE) ×2
GLOVE BIOGEL PI IND STRL 6.5 (GLOVE) ×2 IMPLANT
GLOVE BIOGEL PI IND STRL 7.0 (GLOVE) ×1 IMPLANT
GLOVE BIOGEL PI INDICATOR 6.5 (GLOVE) ×4
GLOVE BIOGEL PI INDICATOR 7.0 (GLOVE) ×2
GOWN STRL REUS W/ TWL LRG LVL3 (GOWN DISPOSABLE) ×2 IMPLANT
GOWN STRL REUS W/ TWL XL LVL3 (GOWN DISPOSABLE) ×1 IMPLANT
GOWN STRL REUS W/TWL LRG LVL3 (GOWN DISPOSABLE) ×4
GOWN STRL REUS W/TWL XL LVL3 (GOWN DISPOSABLE) ×2
INSERT FOGARTY SM (MISCELLANEOUS) IMPLANT
KIT BASIN OR (CUSTOM PROCEDURE TRAY) ×3 IMPLANT
KIT TURNOVER KIT B (KITS) ×3 IMPLANT
NS IRRIG 1000ML POUR BTL (IV SOLUTION) ×3 IMPLANT
PACK CV ACCESS (CUSTOM PROCEDURE TRAY) ×3 IMPLANT
PAD ARMBOARD 7.5X6 YLW CONV (MISCELLANEOUS) ×6 IMPLANT
SUT MNCRL AB 4-0 PS2 18 (SUTURE) ×3 IMPLANT
SUT PROLENE 6 0 BV (SUTURE) ×3 IMPLANT
SUT VIC AB 3-0 SH 27 (SUTURE) ×2
SUT VIC AB 3-0 SH 27X BRD (SUTURE) ×1 IMPLANT
TOWEL GREEN STERILE (TOWEL DISPOSABLE) ×3 IMPLANT
UNDERPAD 30X30 (UNDERPADS AND DIAPERS) ×3 IMPLANT
WATER STERILE IRR 1000ML POUR (IV SOLUTION) ×3 IMPLANT

## 2019-08-09 NOTE — Transfer of Care (Signed)
Immediate Anesthesia Transfer of Care Note  Patient: Trevor [name]  Procedure(s) Performed: LEFT ARM Basilic  ARTERIOVENOUS  FISTULA CREATION (Left Arm Upper)  Patient Location: PACU  Anesthesia Type:General  Level of Consciousness: awake and drowsy  Airway & Oxygen Therapy: Patient Spontanous Breathing and Patient connected to face mask oxygen  Post-op Assessment: Report given to RN and Post -op Vital signs reviewed and stable  Post vital signs: Reviewed and stable  Last Vitals:  Vitals Value Taken Time  BP 136/65 08/09/19 0836  Temp 36.4 C 08/09/19 0836  Pulse 101 08/09/19 0836  Resp 16 08/09/19 0836  SpO2 100 % 08/09/19 0836  Vitals shown include unvalidated device data.  Last Pain:  Vitals:   08/09/19 0633  TempSrc: Oral  PainSc: 0-No pain         Complications: No apparent anesthesia complications

## 2019-08-09 NOTE — Discharge Instructions (Addendum)
   [facility]  Discharge Instructions  AV Fistula or Graft Surgery for Dialysis Access  Please refer to the following instructions for your post-procedure care. Your surgeon or physician assistant will discuss any changes with you.  Activity  You may drive the day following your surgery, if you are comfortable and no longer taking prescription pain medication. Resume full activity as the soreness in your incision resolves.  Bathing/Showering  You may shower after you go home. Keep your incision dry for 48 hours. Do not soak in a bathtub, hot tub, or swim until the incision heals completely. You may not shower if you have a hemodialysis catheter.  Incision Care  Clean your incision with mild soap and water after 48 hours. Pat the area dry with a clean towel. You do not need a bandage unless otherwise instructed. Do not apply any ointments or creams to your incision. You may have skin glue on your incision. Do not peel it off. It will come off on its own in about one week. Your arm may swell a bit after surgery. To reduce swelling use pillows to elevate your arm so it is above your heart. Your doctor will tell you if you need to lightly wrap your arm with an ACE bandage.  Diet  Resume your normal diet. There are not special food restrictions following this procedure. In order to heal from your surgery, it is CRITICAL to get adequate nutrition. Your body requires vitamins, minerals, and protein. Vegetables are the best source of vitamins and minerals. Vegetables also provide the perfect balance of protein. Processed food has little nutritional value, so try to avoid this.  Medications  Resume taking all of your medications. If your incision is causing pain, you may take over-the counter pain relievers such as acetaminophen (Tylenol). If you were prescribed a stronger pain medication, please be aware these medications can cause nausea and constipation. Prevent  nausea by taking the medication with a snack or meal. Avoid constipation by drinking plenty of fluids and eating foods with high amount of fiber, such as fruits, vegetables, and grains.  Do not take Tylenol if you are taking prescription pain medications.  Follow up Your surgeon may want to see you in the office following your access surgery. If so, this will be arranged at the time of your surgery.  Please call us immediately for any of the following conditions:  . Increased pain, redness, drainage (pus) from your incision site . Fever of 101 degrees or higher . Severe or worsening pain at your incision site . Hand pain or numbness. .  Reduce your risk of vascular disease:  . Stop smoking. If you would like help, call [facility] at 1-800-QUIT-NOW 223 600 3879) or [place] at [phone]  . Manage your cholesterol . Maintain a desired weight . Control your diabetes . Keep your blood pressure down  Dialysis  It will take several weeks to several months for your new dialysis access to be ready for use. Your surgeon will determine when it is okay to use it. Your nephrologist will continue to direct your dialysis. You can continue to use your Permcath until your new access is ready for use.   08/09/2019 Trevor [name] [id number] [birth year]  Surgeon(s): Waynetta Sandy, MD  Procedure(s): Creation of left 1st stage basilic vein transposition  x Do not stick fistula for 12 weeks    If you have any questions, please call the office at [phone].

## 2019-08-09 NOTE — H&P (Signed)
   History and Physical Update  The patient was interviewed and re-examined.  The patient's previous History and Physical has been reviewed and is unchanged from recent office visit. Plan for left arm avf vs avg today in OR.   Solomon Skowronek C. Donzetta Matters, MD [facility] [place] Office: [phone] Pager: [phone]  08/09/2019, 7:13 AM

## 2019-08-09 NOTE — Anesthesia Procedure Notes (Signed)
Procedure Name: LMA Insertion Performed by: Dontray Haberland H, CRNA Pre-anesthesia Checklist: Patient identified, Emergency Drugs available, Suction available and Patient being monitored Patient Re-evaluated:Patient Re-evaluated prior to induction Oxygen Delivery Method: Circle System Utilized Preoxygenation: Pre-oxygenation with 100% oxygen Induction Type: IV induction Ventilation: Mask ventilation without difficulty LMA: LMA inserted LMA Size: 5.0 Number of attempts: 1 Airway Equipment and Method: Bite block Placement Confirmation: positive ETCO2 Tube secured with: Tape Dental Injury: Teeth and Oropharynx as per pre-operative assessment        

## 2019-08-09 NOTE — Op Note (Signed)
    Patient name: Trevor [name] MRN: [id number] DOB: May 25, 1940 Sex: male  08/09/2019 Pre-operative Diagnosis: ckd 5 Post-operative diagnosis:  Same Surgeon:  Erlene Quan C. Donzetta Matters, MD Assistant: Leontine Locket, PA Procedure Performed: Left upper arm first stage basilic vein AV fistula creation  Indications: 80 year old male with chronic kidney disease stage V.  He is now indicated for dialysis access in his nondominant left upper extremity.  Findings: There was actually a large vein with branching just above the antecubitum that was a basilic vein.  This was approximately 4 mm external diameter.  In the mid upper arm it does join the deep system.  Brachial artery measures approximately 5 mm above the antecubitum.  At completion was a strong thrill in the vein and a palpable radial artery pulse at the wrist both confirmed with Doppler.   Procedure:  The patient was identified in the holding area and taken to the operating where he was placed supine on the operative table and general anesthesia induced.  He was sterilely prepped draped in the left upper extremity in the usual fashion antibiotics were administered and a timeout was called.  We began with ultrasound guidance we evaluated his veins there was a large basilic vein above the antecubitum.  The area was anesthetized 1% lidocaine a transverse incision was made between the palpable brachial artery pulse in the vein.  We dissected out the vein dividing branches between ties.  The vein was marked for orientation.  I dissected through the deep fascia to the brachial artery which was noted to be large and soft and amenable for fistula creation.  A vessel loop was placed around the artery.  We then transected the vein distally.  We spatulated the vein flushed with heparinized saline and clamped it.  We clamped the artery distally proximally opened longitudinally and flushed with heparinized saline both directions.  The vein was sewn into side with 6-0  Prolene suture.  Prior to closure allowed flushing all directions.  Upon completion there is very good thrill in the vein and a palpable radial artery pulse the wrist.  Both of these were confirmed with Doppler.  Satisfied we obtain hemostasis irrigated the wound and closed in layers of Vicryl and Monocryl.  Dermabond was placed to the level of the skin.  He was awake from anesthesia having tolerated procedure without immediate complication but all counts were correct at completion.  EBL: 25 cc   [name] C. Donzetta Matters, MD [facility] [place] Office: [phone] Pager: [phone]

## 2019-08-09 NOTE — Anesthesia Postprocedure Evaluation (Signed)
Anesthesia Post Note  Patient: Trevor [name]  Procedure(s) Performed: LEFT ARM Basilic  ARTERIOVENOUS  FISTULA CREATION (Left Arm Upper)     Patient location during evaluation: PACU Anesthesia Type: General Level of consciousness: awake Pain management: pain level controlled Vital Signs Assessment: post-procedure vital signs reviewed and stable Respiratory status: spontaneous breathing, nonlabored ventilation, respiratory function stable and patient connected to nasal cannula oxygen Cardiovascular status: blood pressure returned to baseline and stable Postop Assessment: no apparent nausea or vomiting Anesthetic complications: no    Last Vitals:  Vitals:   08/09/19 0905 08/09/19 0920  BP: (!) 155/63 (!) 164/67  Pulse: 62 68  Resp: 10 14  Temp: (!) 36.1 C   SpO2: 100% 100%    Last Pain:  Vitals:   08/09/19 0920  TempSrc:   PainSc: 0-No pain                 Aubrey Voong P Kilah Drahos

## 2019-08-17 DIAGNOSIS — E1122 Type 2 diabetes mellitus with diabetic chronic kidney disease: Secondary | ICD-10-CM | POA: Diagnosis not present

## 2019-08-17 DIAGNOSIS — N185 Chronic kidney disease, stage 5: Secondary | ICD-10-CM | POA: Diagnosis not present

## 2019-08-17 DIAGNOSIS — D631 Anemia in chronic kidney disease: Secondary | ICD-10-CM | POA: Diagnosis not present

## 2019-08-17 DIAGNOSIS — Z79899 Other long term (current) drug therapy: Secondary | ICD-10-CM | POA: Diagnosis not present

## 2019-08-18 DIAGNOSIS — R809 Proteinuria, unspecified: Secondary | ICD-10-CM | POA: Diagnosis not present

## 2019-08-18 DIAGNOSIS — N185 Chronic kidney disease, stage 5: Secondary | ICD-10-CM | POA: Diagnosis not present

## 2019-08-18 DIAGNOSIS — I129 Hypertensive chronic kidney disease with stage 1 through stage 4 chronic kidney disease, or unspecified chronic kidney disease: Secondary | ICD-10-CM | POA: Diagnosis not present

## 2019-08-18 DIAGNOSIS — E1129 Type 2 diabetes mellitus with other diabetic kidney complication: Secondary | ICD-10-CM | POA: Diagnosis not present

## 2019-08-18 DIAGNOSIS — E211 Secondary hyperparathyroidism, not elsewhere classified: Secondary | ICD-10-CM | POA: Diagnosis not present

## 2019-08-18 DIAGNOSIS — E1122 Type 2 diabetes mellitus with diabetic chronic kidney disease: Secondary | ICD-10-CM | POA: Diagnosis not present

## 2019-08-18 DIAGNOSIS — I770 Arteriovenous fistula, acquired: Secondary | ICD-10-CM | POA: Diagnosis not present

## 2019-08-18 DIAGNOSIS — E875 Hyperkalemia: Secondary | ICD-10-CM | POA: Diagnosis not present

## 2019-08-18 DIAGNOSIS — R600 Localized edema: Secondary | ICD-10-CM | POA: Diagnosis not present

## 2019-09-20 ENCOUNTER — Other Ambulatory Visit: Payer: Self-pay | Admitting: *Deleted

## 2019-09-20 DIAGNOSIS — N186 End stage renal disease: Secondary | ICD-10-CM

## 2019-09-20 DIAGNOSIS — N185 Chronic kidney disease, stage 5: Secondary | ICD-10-CM

## 2019-09-22 ENCOUNTER — Ambulatory Visit (HOSPITAL_COMMUNITY): Payer: Medicare HMO

## 2019-09-22 ENCOUNTER — Ambulatory Visit: Payer: Medicare HMO

## 2019-10-17 DIAGNOSIS — I770 Arteriovenous fistula, acquired: Secondary | ICD-10-CM | POA: Diagnosis not present

## 2019-10-17 DIAGNOSIS — E211 Secondary hyperparathyroidism, not elsewhere classified: Secondary | ICD-10-CM | POA: Diagnosis not present

## 2019-10-17 DIAGNOSIS — R809 Proteinuria, unspecified: Secondary | ICD-10-CM | POA: Diagnosis not present

## 2019-10-17 DIAGNOSIS — E1122 Type 2 diabetes mellitus with diabetic chronic kidney disease: Secondary | ICD-10-CM | POA: Diagnosis not present

## 2019-10-17 DIAGNOSIS — E1129 Type 2 diabetes mellitus with other diabetic kidney complication: Secondary | ICD-10-CM | POA: Diagnosis not present

## 2019-10-17 DIAGNOSIS — E875 Hyperkalemia: Secondary | ICD-10-CM | POA: Diagnosis not present

## 2019-10-17 DIAGNOSIS — N185 Chronic kidney disease, stage 5: Secondary | ICD-10-CM | POA: Diagnosis not present

## 2019-10-18 DIAGNOSIS — D631 Anemia in chronic kidney disease: Secondary | ICD-10-CM | POA: Diagnosis not present

## 2019-10-18 DIAGNOSIS — E211 Secondary hyperparathyroidism, not elsewhere classified: Secondary | ICD-10-CM | POA: Diagnosis not present

## 2019-10-18 DIAGNOSIS — E872 Acidosis: Secondary | ICD-10-CM | POA: Diagnosis not present

## 2019-10-18 DIAGNOSIS — N189 Chronic kidney disease, unspecified: Secondary | ICD-10-CM | POA: Diagnosis not present

## 2019-10-18 DIAGNOSIS — N185 Chronic kidney disease, stage 5: Secondary | ICD-10-CM | POA: Diagnosis not present

## 2019-10-18 DIAGNOSIS — I129 Hypertensive chronic kidney disease with stage 1 through stage 4 chronic kidney disease, or unspecified chronic kidney disease: Secondary | ICD-10-CM | POA: Diagnosis not present

## 2019-10-18 DIAGNOSIS — E1122 Type 2 diabetes mellitus with diabetic chronic kidney disease: Secondary | ICD-10-CM | POA: Diagnosis not present

## 2019-10-18 NOTE — Progress Notes (Signed)
  POST OPERATIVE OFFICE NOTE    CC:  F/u for surgery  HPI:  This is a 79 y.o. male who is s/p left 1st stage BVT on 08/09/2019 by Dr. Donzetta Matters.  Pt states he does not have pain/numbness in left hand.     The pt is not on dialysis and his nephrologist is Dr. Theador Hawthorne.    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  His granddaughter is in her 4th year in law school and his grandson graduated from [employer].   No Known Allergies  Current Outpatient Medications  Medication Sig Dispense Refill  . amLODipine (NORVASC) 10 MG tablet Take 10 mg by mouth at bedtime.     Marland Kitchen aspirin EC 81 MG tablet Take 81 mg by mouth daily.    . Cholecalciferol 25 MCG (1000 UT) tablet Take 1,000 Units by mouth daily.     . cloNIDine (CATAPRES) 0.2 MG tablet Take 0.2 mg by mouth 2 (two) times daily.     . furosemide (LASIX) 40 MG tablet Take 40 mg by mouth daily.     Marland Kitchen OVER THE COUNTER MEDICATION Take 2 tablets by mouth in the morning and at bedtime. ultimate cleanser otc    . OVER THE COUNTER MEDICATION Take 1 tablet by mouth in the morning and at bedtime. Kidney Cleanser    . oxyCODONE-acetaminophen (PERCOCET) 5-325 MG tablet Take 1 tablet by mouth every 6 (six) hours as needed. 8 tablet 0  . pioglitazone (ACTOS) 15 MG tablet Take 15 mg by mouth daily as needed (high blood sugar).     . rosuvastatin (CRESTOR) 10 MG tablet Take 10 mg by mouth daily.     . sodium bicarbonate 650 MG tablet Take 650 mg by mouth 3 (three) times daily.     . Tragacanth (ASTRAGALUS ROOT) POWD Take 1 Dose by mouth daily.    Tyler Aas FLEXTOUCH 200 UNIT/ML SOPN Inject 20 Units into the skin at bedtime as needed (high blood sugar).      No current facility-administered medications for this visit.     ROS:  See HPI  Physical Exam:  Today's Vitals   10/20/19 0829  BP: (!) 181/71  Pulse: 60  Resp: 20  Temp: (!) 97.5 F (36.4 C)  TempSrc: Temporal  SpO2: 100%    Weight: 229 lb 9.6 oz (104.1 kg)  Height: 5\' 10"  (1.778 m)   Body mass index is 32.94 kg/m.   Incision:  Healed nicely Extremities:  There is a faintly palpable left radial pulse.  Motor and sensory are in tact.  There is a thrill/bruit present.   Dialysis Duplex on 10/20/2019: Diameter:  0.38cm-0.58cm Depth:  0.82cm-1.68cm   Assessment/Plan:  This is a 79 y.o. male who is s/p:  left 1st stage BVT on 08/09/2019 by Dr. Donzetta Matters  -the pt does not have evidence of steal. -the fistula is mature enough for 2nd stage BVT.  I had a long discussion with pt about 2nd stage surgery as well as access needing maintenance over the long term and he expressed understanding.  Discussed the importance of 2nd stage to get the fistula ready should he need dialysis to avoid having a TDC if possible.  -the pt will be scheduled for 2nd stage BVT with Dr. Gregor Hams, [facility] [phone]  Clinic MD:  Donzetta Matters

## 2019-10-20 ENCOUNTER — Other Ambulatory Visit: Payer: Self-pay

## 2019-10-20 ENCOUNTER — Ambulatory Visit (HOSPITAL_COMMUNITY)
Admission: RE | Admit: 2019-10-20 | Discharge: 2019-10-20 | Disposition: A | Discharge status: Home / Self Care | Payer: Medicare HMO | Source: Ambulatory Visit | Attending: Physician Assistant | Admitting: Physician Assistant

## 2019-10-20 ENCOUNTER — Ambulatory Visit (INDEPENDENT_AMBULATORY_CARE_PROVIDER_SITE_OTHER): Payer: Self-pay | Admitting: Physician Assistant

## 2019-10-20 VITALS — BP 181/71 | HR 60 | Temp 97.5°F | Resp 20 | Ht 70.0 in | Wt 229.6 lb

## 2019-10-20 DIAGNOSIS — N186 End stage renal disease: Secondary | ICD-10-CM

## 2019-10-20 DIAGNOSIS — N185 Chronic kidney disease, stage 5: Secondary | ICD-10-CM

## 2019-11-07 DIAGNOSIS — E1121 Type 2 diabetes mellitus with diabetic nephropathy: Secondary | ICD-10-CM | POA: Diagnosis not present

## 2019-11-07 DIAGNOSIS — I10 Essential (primary) hypertension: Secondary | ICD-10-CM | POA: Diagnosis not present

## 2019-11-07 DIAGNOSIS — N184 Chronic kidney disease, stage 4 (severe): Secondary | ICD-10-CM | POA: Diagnosis not present

## 2019-11-07 DIAGNOSIS — D631 Anemia in chronic kidney disease: Secondary | ICD-10-CM | POA: Diagnosis not present

## 2019-11-07 DIAGNOSIS — E1122 Type 2 diabetes mellitus with diabetic chronic kidney disease: Secondary | ICD-10-CM | POA: Diagnosis not present

## 2019-11-07 DIAGNOSIS — E782 Mixed hyperlipidemia: Secondary | ICD-10-CM | POA: Diagnosis not present

## 2019-11-09 ENCOUNTER — Other Ambulatory Visit: Payer: Self-pay | Admitting: Internal Medicine

## 2019-11-09 ENCOUNTER — Other Ambulatory Visit (HOSPITAL_COMMUNITY): Payer: Self-pay | Admitting: Internal Medicine

## 2019-11-09 DIAGNOSIS — E782 Mixed hyperlipidemia: Secondary | ICD-10-CM | POA: Diagnosis not present

## 2019-11-09 DIAGNOSIS — E1121 Type 2 diabetes mellitus with diabetic nephropathy: Secondary | ICD-10-CM | POA: Diagnosis not present

## 2019-11-09 DIAGNOSIS — N184 Chronic kidney disease, stage 4 (severe): Secondary | ICD-10-CM | POA: Diagnosis not present

## 2019-11-09 DIAGNOSIS — E1122 Type 2 diabetes mellitus with diabetic chronic kidney disease: Secondary | ICD-10-CM | POA: Diagnosis not present

## 2019-11-09 DIAGNOSIS — R0789 Other chest pain: Secondary | ICD-10-CM | POA: Diagnosis not present

## 2019-11-09 DIAGNOSIS — D631 Anemia in chronic kidney disease: Secondary | ICD-10-CM | POA: Diagnosis not present

## 2019-11-09 DIAGNOSIS — I2510 Atherosclerotic heart disease of native coronary artery without angina pectoris: Secondary | ICD-10-CM

## 2019-11-09 DIAGNOSIS — I10 Essential (primary) hypertension: Secondary | ICD-10-CM | POA: Diagnosis not present

## 2019-11-09 DIAGNOSIS — R600 Localized edema: Secondary | ICD-10-CM | POA: Diagnosis not present

## 2019-11-09 DIAGNOSIS — Z86010 Personal history of colonic polyps: Secondary | ICD-10-CM | POA: Diagnosis not present

## 2019-11-16 ENCOUNTER — Other Ambulatory Visit: Payer: Self-pay

## 2019-11-16 ENCOUNTER — Ambulatory Visit (HOSPITAL_COMMUNITY)
Admission: RE | Admit: 2019-11-16 | Discharge: 2019-11-16 | Disposition: A | Discharge status: Home / Self Care | Payer: Medicare HMO | Source: Ambulatory Visit | Attending: Internal Medicine | Admitting: Internal Medicine

## 2019-11-16 DIAGNOSIS — I2510 Atherosclerotic heart disease of native coronary artery without angina pectoris: Secondary | ICD-10-CM | POA: Diagnosis not present

## 2019-11-16 DIAGNOSIS — I6523 Occlusion and stenosis of bilateral carotid arteries: Secondary | ICD-10-CM | POA: Diagnosis not present

## 2019-11-16 IMAGING — US US CAROTID DUPLEX BILAT
1 series · 13 of 24 positions shown · non-contrast
Comparison: None.

CLINICAL DATA: 79-year-old male with a history coronary artery
disease

EXAM:
BILATERAL CAROTID DUPLEX ULTRASOUND
TECHNIQUE: Gray scale imaging, color Doppler and duplex ultrasound were
performed of bilateral carotid and vertebral arteries in the neck.

[Series 1: us carotid bilateral · 13 of 70 slices shown]
[im 1/70]
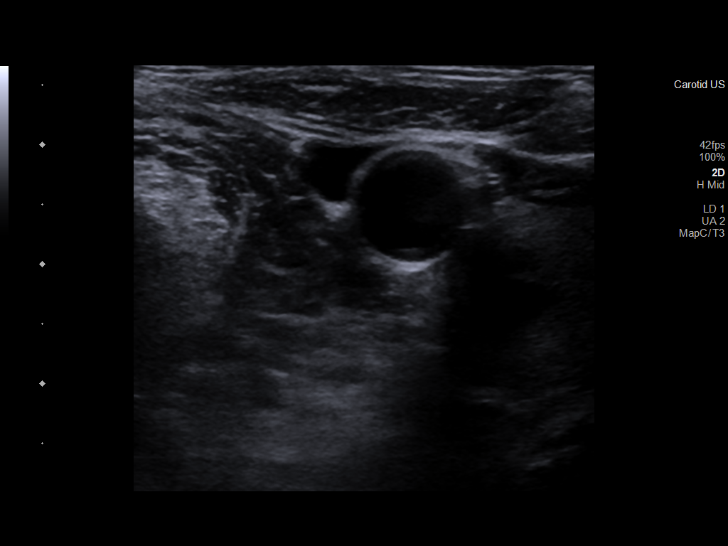
[im 7/70]
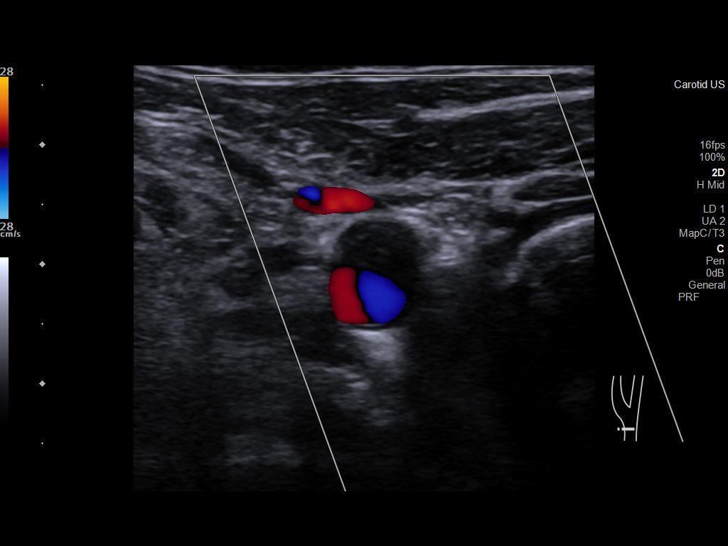
[im 13/70]
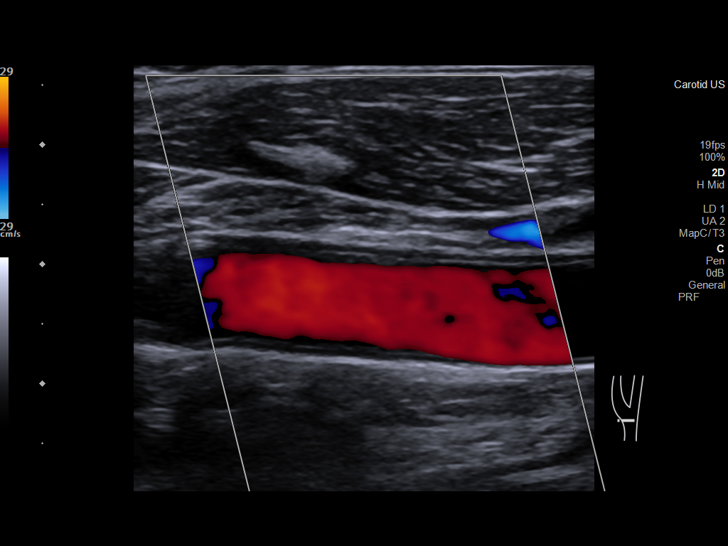
[im 19/70]
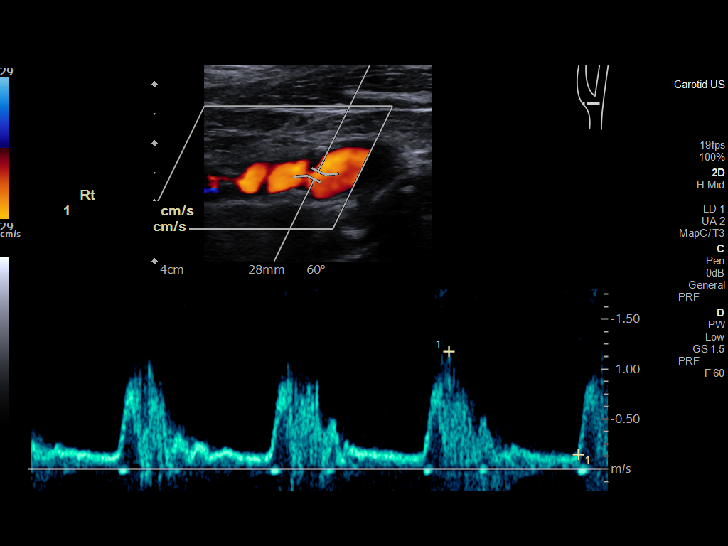
[im 25/70]
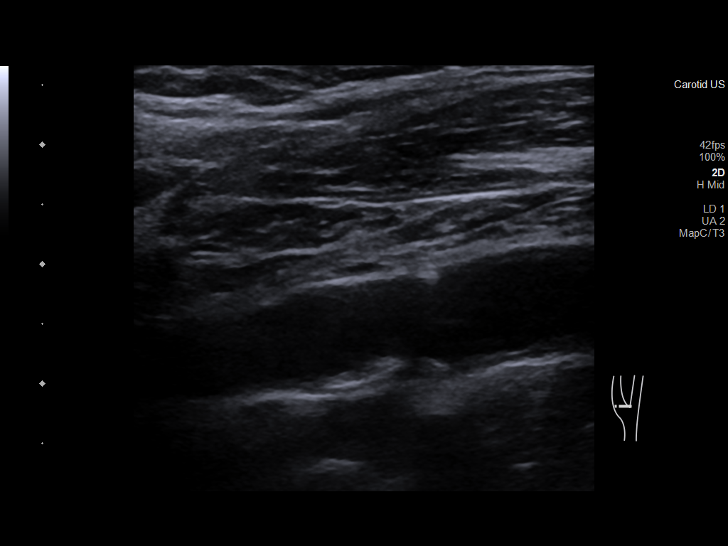
[im 31/70]
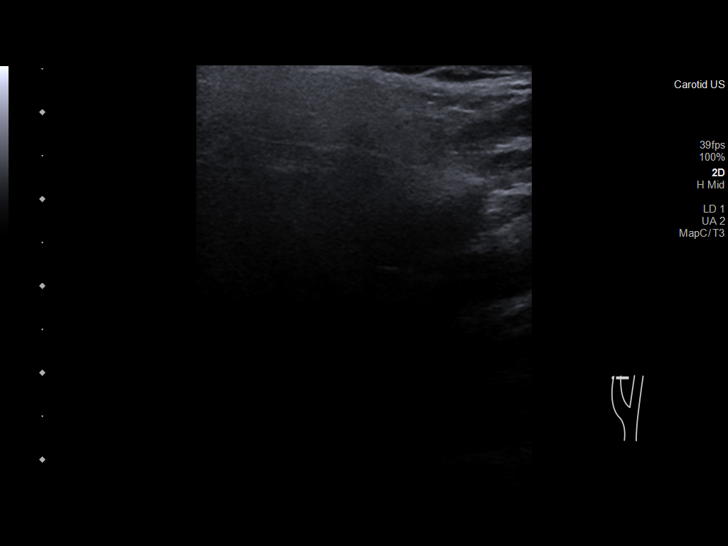
[im 37/70]
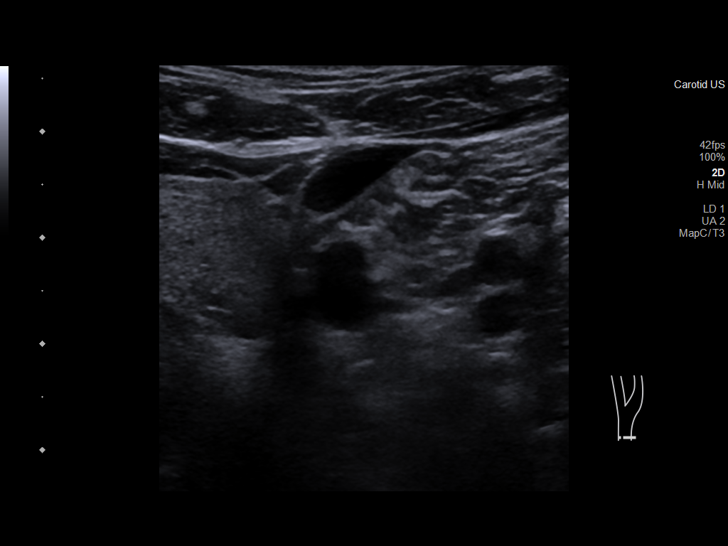
[im 40/70]
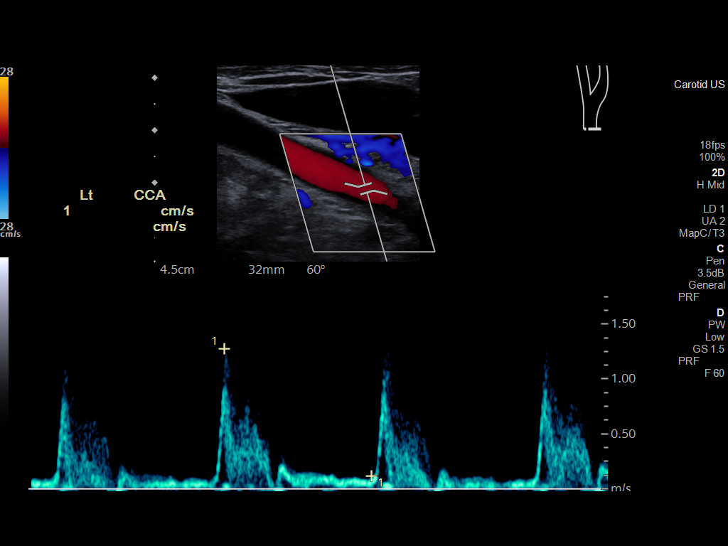
[im 46/70]
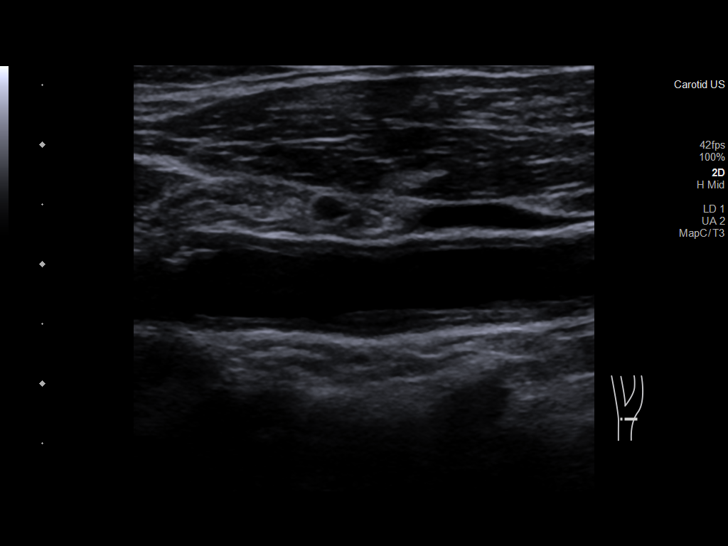
[im 52/70]
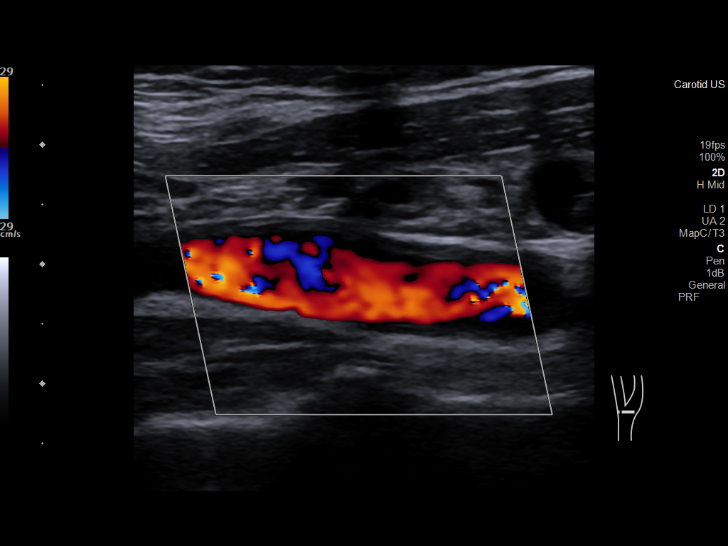
[im 58/70]
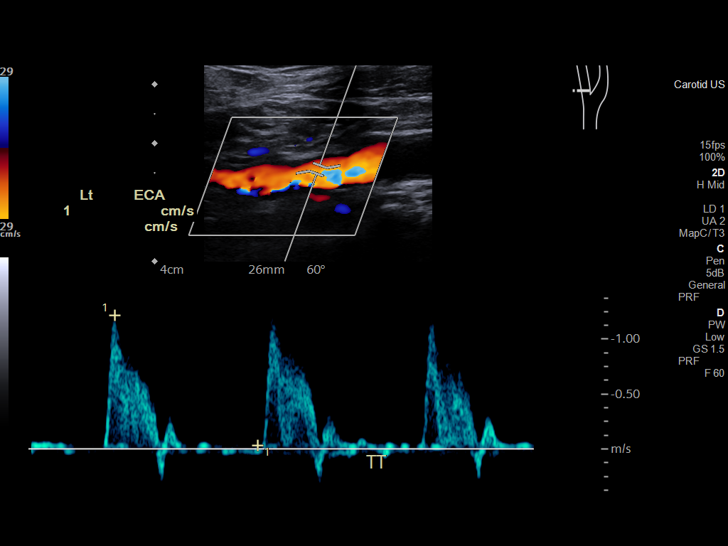
[im 64/70]
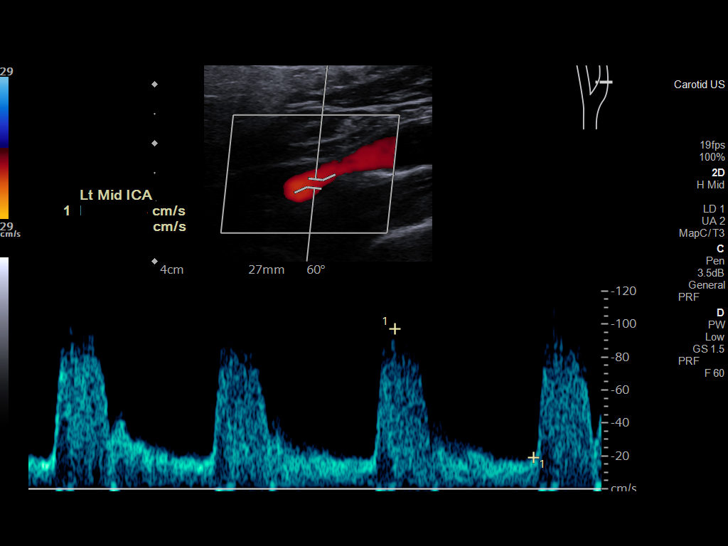
[im 70/70]
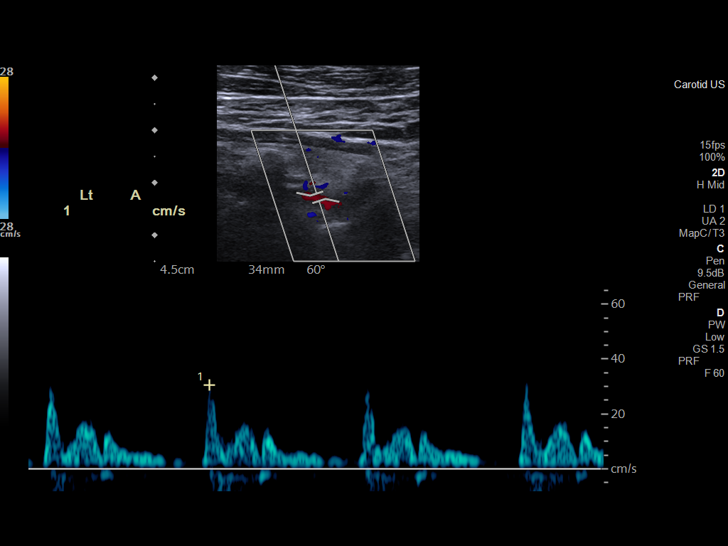

[13 of 24 positions shown; findings below may reference images not displayed]

FINDINGS: Criteria: Quantification of carotid stenosis is based on velocity
parameters that correlate the residual internal carotid diameter
with NASCET-based stenosis levels, using the diameter of the distal
internal carotid lumen as the denominator for stenosis measurement.

The following velocity measurements were obtained:

RIGHT

ICA:  Systolic 131 cm/sec, Diastolic 28 cm/sec

CCA:  187 cm/sec

SYSTOLIC ICA/CCA RATIO:

ECA:  161 cm/sec

LEFT

ICA:  Systolic 106 cm/sec, Diastolic 22 cm/sec

CCA:  132 cm/sec

SYSTOLIC ICA/CCA RATIO:

ECA:  122 cm/sec

Right Brachial SBP: Not acquired

Left Brachial SBP: Not acquired

RIGHT CAROTID ARTERY: No significant calcified disease of the right
common carotid artery. Intermediate waveform maintained. Homogeneous
plaque without significant calcifications at the right carotid
bifurcation. Low resistance waveform of the right ICA. No
significant tortuosity.

RIGHT VERTEBRAL ARTERY: Antegrade flow with low resistance waveform.

LEFT CAROTID ARTERY: No significant calcified disease of the left
common carotid artery. Intermediate waveform maintained. Homogeneous
plaque at the left carotid bifurcation without significant
calcifications. Low resistance waveform of the left ICA.

LEFT VERTEBRAL ARTERY:  Antegrade flow with low resistance waveform.
IMPRESSION: Color duplex indicates minimal homogeneous plaque, with no
hemodynamically significant stenosis by duplex criteria in the
extracranial cerebrovascular circulation.

## 2019-11-27 ENCOUNTER — Other Ambulatory Visit (HOSPITAL_COMMUNITY): Payer: Medicare HMO

## 2019-11-27 DIAGNOSIS — N184 Chronic kidney disease, stage 4 (severe): Secondary | ICD-10-CM | POA: Diagnosis not present

## 2019-11-27 DIAGNOSIS — Z0000 Encounter for general adult medical examination without abnormal findings: Secondary | ICD-10-CM | POA: Diagnosis not present

## 2019-11-27 DIAGNOSIS — E1122 Type 2 diabetes mellitus with diabetic chronic kidney disease: Secondary | ICD-10-CM | POA: Diagnosis not present

## 2019-11-27 DIAGNOSIS — I10 Essential (primary) hypertension: Secondary | ICD-10-CM | POA: Diagnosis not present

## 2019-11-27 DIAGNOSIS — K219 Gastro-esophageal reflux disease without esophagitis: Secondary | ICD-10-CM | POA: Diagnosis not present

## 2019-11-27 DIAGNOSIS — E1140 Type 2 diabetes mellitus with diabetic neuropathy, unspecified: Secondary | ICD-10-CM | POA: Diagnosis not present

## 2019-11-27 DIAGNOSIS — R809 Proteinuria, unspecified: Secondary | ICD-10-CM | POA: Diagnosis not present

## 2019-11-27 DIAGNOSIS — D631 Anemia in chronic kidney disease: Secondary | ICD-10-CM | POA: Diagnosis not present

## 2019-11-27 DIAGNOSIS — E782 Mixed hyperlipidemia: Secondary | ICD-10-CM | POA: Diagnosis not present

## 2019-11-28 ENCOUNTER — Encounter (HOSPITAL_COMMUNITY): Payer: Self-pay | Admitting: Vascular Surgery

## 2019-11-28 ENCOUNTER — Telehealth: Payer: Self-pay

## 2019-11-28 ENCOUNTER — Other Ambulatory Visit: Payer: Self-pay

## 2019-11-28 NOTE — Progress Notes (Signed)
Called Dr. Juel Burrow office and pt's most recent A1C was 6.9 on 11/07/19.

## 2019-11-28 NOTE — Telephone Encounter (Signed)
Message received from pt calling to inquire what medications he should hold prior to surgery. Pt has since received a call from preadmission who instructed him to only take his cholesterol and blood pressure medications. Advised pt to also take his Aspirin. Pt voiced understanding.

## 2019-11-28 NOTE — Progress Notes (Signed)
Spoke with pt for pre-op call. Pt denies cardiac history. Pt is treated for HTN and Type 2 Diabetes. Pt states he only checks his blood sugar in the evening before he takes his [place]. He is on a sliding scale insulin dose with this. I instructed him to take 1/2 of the [place] Wednesday night if his blood sugar is what he would normally choose to use the [place]. He voiced understanding. Pt states he's not sure what or when his last A1C was. He states Dr. Delphina Cahill is the one that orders it.   Pt states he was told he could get his Covid test done at Dr. Juel Burrow office. He states he got it done yesterday and has been in quarantine since. He states he will have someone pick up the results tomorrow and will bring it with him day of surgery. He states he understands that he stay in quarantine until he comes to the hospital on Thursday.

## 2019-11-28 NOTE — Progress Notes (Signed)
Pt will need Covid test day of surgery. Pt had his done at Dr. Juel Burrow office and states he had been told that he could do that by someone at [facility]. I have notified pt and asked if he could come tomorrow to get it done. He said no because he has already had it done. I told him we would do it the day of surgery and he said ok.

## 2019-11-30 ENCOUNTER — Ambulatory Visit (HOSPITAL_COMMUNITY): Payer: Medicare HMO

## 2019-11-30 ENCOUNTER — Encounter (HOSPITAL_COMMUNITY)
Admission: RE | Disposition: A | Discharge status: Home / Self Care | Payer: Self-pay | Source: Home / Self Care | Attending: Vascular Surgery

## 2019-11-30 ENCOUNTER — Encounter (HOSPITAL_COMMUNITY): Payer: Self-pay | Admitting: Vascular Surgery

## 2019-11-30 ENCOUNTER — Ambulatory Visit (HOSPITAL_COMMUNITY)
Admission: RE | Admit: 2019-11-30 | Discharge: 2019-11-30 | Disposition: A | Discharge status: Home / Self Care | Payer: Medicare HMO | Attending: Vascular Surgery | Admitting: Vascular Surgery

## 2019-11-30 ENCOUNTER — Other Ambulatory Visit: Payer: Self-pay

## 2019-11-30 DIAGNOSIS — Z79899 Other long term (current) drug therapy: Secondary | ICD-10-CM | POA: Insufficient documentation

## 2019-11-30 DIAGNOSIS — D631 Anemia in chronic kidney disease: Secondary | ICD-10-CM | POA: Diagnosis not present

## 2019-11-30 DIAGNOSIS — Z20822 Contact with and (suspected) exposure to covid-19: Secondary | ICD-10-CM | POA: Insufficient documentation

## 2019-11-30 DIAGNOSIS — Z7982 Long term (current) use of aspirin: Secondary | ICD-10-CM | POA: Insufficient documentation

## 2019-11-30 DIAGNOSIS — N185 Chronic kidney disease, stage 5: Secondary | ICD-10-CM | POA: Diagnosis not present

## 2019-11-30 DIAGNOSIS — Z6833 Body mass index (BMI) 33.0-33.9, adult: Secondary | ICD-10-CM | POA: Diagnosis not present

## 2019-11-30 DIAGNOSIS — I120 Hypertensive chronic kidney disease with stage 5 chronic kidney disease or end stage renal disease: Secondary | ICD-10-CM | POA: Insufficient documentation

## 2019-11-30 DIAGNOSIS — D649 Anemia, unspecified: Secondary | ICD-10-CM | POA: Diagnosis not present

## 2019-11-30 DIAGNOSIS — E1122 Type 2 diabetes mellitus with diabetic chronic kidney disease: Secondary | ICD-10-CM | POA: Insufficient documentation

## 2019-11-30 DIAGNOSIS — Z794 Long term (current) use of insulin: Secondary | ICD-10-CM | POA: Diagnosis not present

## 2019-11-30 DIAGNOSIS — E669 Obesity, unspecified: Secondary | ICD-10-CM | POA: Insufficient documentation

## 2019-11-30 HISTORY — PX: BASCILIC VEIN TRANSPOSITION: SHX5742

## 2019-11-30 LAB — POCT I-STAT, CHEM 8
BUN: 60 mg/dL — ABNORMAL HIGH (ref 8–23)
Calcium, Ion: 1.13 mmol/L — ABNORMAL LOW (ref 1.15–1.40)
Chloride: 104 mmol/L (ref 98–111)
Creatinine, Ser: 4 mg/dL — ABNORMAL HIGH (ref 0.61–1.24)
Glucose, Bld: 120 mg/dL — ABNORMAL HIGH (ref 70–99)
HCT: 32 % — ABNORMAL LOW (ref 39.0–52.0)
Hemoglobin: 10.9 g/dL — ABNORMAL LOW (ref 13.0–17.0)
Potassium: 3.9 mmol/L (ref 3.5–5.1)
Sodium: 141 mmol/L (ref 135–145)
TCO2: 23 mmol/L (ref 22–32)

## 2019-11-30 LAB — GLUCOSE, CAPILLARY: Glucose-Capillary: 91 mg/dL (ref 70–99)

## 2019-11-30 LAB — SARS CORONAVIRUS 2 BY RT PCR (HOSPITAL ORDER, PERFORMED IN ~~LOC~~ HOSPITAL LAB): SARS Coronavirus 2: NEGATIVE

## 2019-11-30 SURGERY — TRANSPOSITION, VEIN, BASILIC
Anesthesia: General | Site: Arm Upper | Laterality: Left

## 2019-11-30 MED ORDER — SODIUM CHLORIDE 0.9 % IV SOLN
INTRAVENOUS | Status: AC
  Filled 2019-11-30: qty 1.2

## 2019-11-30 MED ORDER — ACETAMINOPHEN 10 MG/ML IV SOLN: 1000.0000 mg | Freq: Once | INTRAVENOUS | Status: DC | PRN

## 2019-11-30 MED ORDER — FENTANYL CITRATE (PF) 250 MCG/5ML IJ SOLN
INTRAMUSCULAR | Status: DC | PRN
  Administered 2019-11-30: 75 ug via INTRAVENOUS

## 2019-11-30 MED ORDER — HYDRALAZINE HCL 20 MG/ML IJ SOLN
INTRAMUSCULAR | Status: AC
  Filled 2019-11-30: qty 1

## 2019-11-30 MED ORDER — MEPERIDINE HCL 25 MG/ML IJ SOLN: 6.2500 mg | INTRAMUSCULAR | Status: DC | PRN

## 2019-11-30 MED ORDER — ACETAMINOPHEN 325 MG PO TABS: 325.0000 mg | ORAL_TABLET | Freq: Once | ORAL | Status: DC | PRN

## 2019-11-30 MED ORDER — ONDANSETRON HCL 4 MG/2ML IJ SOLN
INTRAMUSCULAR | Status: AC
  Filled 2019-11-30: qty 8

## 2019-11-30 MED ORDER — ONDANSETRON HCL 4 MG/2ML IJ SOLN
INTRAMUSCULAR | Status: DC | PRN
  Administered 2019-11-30: 4 mg via INTRAVENOUS

## 2019-11-30 MED ORDER — HYDRALAZINE HCL 20 MG/ML IJ SOLN
5.0000 mg | INTRAMUSCULAR | Status: AC | PRN
  Administered 2019-11-30 (×2): 5 mg via INTRAVENOUS

## 2019-11-30 MED ORDER — SODIUM CHLORIDE 0.9 % IV SOLN: INTRAVENOUS | Status: DC

## 2019-11-30 MED ORDER — AMISULPRIDE (ANTIEMETIC) 5 MG/2ML IV SOLN: 10.0000 mg | Freq: Once | INTRAVENOUS | Status: DC | PRN

## 2019-11-30 MED ORDER — 0.9 % SODIUM CHLORIDE (POUR BTL) OPTIME
TOPICAL | Status: DC | PRN
  Administered 2019-11-30: 1000 mL

## 2019-11-30 MED ORDER — DEXAMETHASONE SODIUM PHOSPHATE 4 MG/ML IJ SOLN
INTRAMUSCULAR | Status: DC | PRN
  Administered 2019-11-30: 10 mg via INTRAVENOUS

## 2019-11-30 MED ORDER — PHENYLEPHRINE 40 MCG/ML (10ML) SYRINGE FOR IV PUSH (FOR BLOOD PRESSURE SUPPORT)
PREFILLED_SYRINGE | INTRAVENOUS | Status: AC
  Filled 2019-11-30: qty 10

## 2019-11-30 MED ORDER — LIDOCAINE 2% (20 MG/ML) 5 ML SYRINGE
INTRAMUSCULAR | Status: AC
  Filled 2019-11-30: qty 5

## 2019-11-30 MED ORDER — PHENYLEPHRINE HCL (PRESSORS) 10 MG/ML IV SOLN
INTRAVENOUS | Status: DC | PRN
  Administered 2019-11-30: 80 ug via INTRAVENOUS
  Administered 2019-11-30: 120 ug via INTRAVENOUS

## 2019-11-30 MED ORDER — ACETAMINOPHEN 160 MG/5ML PO SOLN: 325.0000 mg | Freq: Once | ORAL | Status: DC | PRN

## 2019-11-30 MED ORDER — DEXAMETHASONE SODIUM PHOSPHATE 10 MG/ML IJ SOLN
INTRAMUSCULAR | Status: AC
  Filled 2019-11-30: qty 1

## 2019-11-30 MED ORDER — CEFAZOLIN SODIUM-DEXTROSE 2-4 GM/100ML-% IV SOLN
2.0000 g | INTRAVENOUS | Status: AC
  Administered 2019-11-30: 2 g via INTRAVENOUS
  Filled 2019-11-30: qty 100

## 2019-11-30 MED ORDER — PROPOFOL 10 MG/ML IV BOLUS
INTRAVENOUS | Status: AC
  Filled 2019-11-30: qty 20

## 2019-11-30 MED ORDER — FENTANYL CITRATE (PF) 100 MCG/2ML IJ SOLN: 25.0000 ug | INTRAMUSCULAR | Status: DC | PRN

## 2019-11-30 MED ORDER — HYDROCODONE-ACETAMINOPHEN 5-325 MG PO TABS: 1.0000 | ORAL_TABLET | Freq: Four times a day (QID) | ORAL | 0 refills | Status: DC | PRN

## 2019-11-30 MED ORDER — CHLORHEXIDINE GLUCONATE 4 % EX LIQD: 60.0000 mL | Freq: Once | CUTANEOUS | Status: DC

## 2019-11-30 MED ORDER — MIDAZOLAM HCL 2 MG/2ML IJ SOLN
INTRAMUSCULAR | Status: AC
  Filled 2019-11-30: qty 2

## 2019-11-30 MED ORDER — LIDOCAINE-EPINEPHRINE (PF) 1 %-1:200000 IJ SOLN
INTRAMUSCULAR | Status: AC
  Filled 2019-11-30: qty 30

## 2019-11-30 MED ORDER — EPHEDRINE SULFATE-NACL 50-0.9 MG/10ML-% IV SOSY
PREFILLED_SYRINGE | INTRAVENOUS | Status: DC | PRN
  Administered 2019-11-30: 10 mg via INTRAVENOUS
  Administered 2019-11-30: 5 mg via INTRAVENOUS
  Administered 2019-11-30 (×2): 10 mg via INTRAVENOUS

## 2019-11-30 MED ORDER — FENTANYL CITRATE (PF) 250 MCG/5ML IJ SOLN
INTRAMUSCULAR | Status: AC
  Filled 2019-11-30: qty 5

## 2019-11-30 MED ORDER — LACTATED RINGERS IV SOLN: INTRAVENOUS | Status: DC

## 2019-11-30 MED ORDER — EPHEDRINE 5 MG/ML INJ
INTRAVENOUS | Status: AC
  Filled 2019-11-30: qty 10

## 2019-11-30 MED ORDER — CHLORHEXIDINE GLUCONATE 0.12 % MT SOLN
OROMUCOSAL | Status: AC
  Administered 2019-11-30: 15 mL
  Filled 2019-11-30: qty 15

## 2019-11-30 MED ORDER — PROPOFOL 10 MG/ML IV BOLUS
INTRAVENOUS | Status: DC | PRN
  Administered 2019-11-30: 200 mg via INTRAVENOUS

## 2019-11-30 MED ORDER — LIDOCAINE 2% (20 MG/ML) 5 ML SYRINGE
INTRAMUSCULAR | Status: DC | PRN
  Administered 2019-11-30: 40 mg via INTRAVENOUS

## 2019-11-30 MED ORDER — SODIUM CHLORIDE 0.9 % IV SOLN: INTRAVENOUS | Status: DC | PRN

## 2019-11-30 SURGICAL SUPPLY — 33 items
ARMBAND PINK RESTRICT EXTREMIT (MISCELLANEOUS) ×3 IMPLANT
CANISTER SUCT 3000ML PPV (MISCELLANEOUS) ×3 IMPLANT
CLIP VESOCCLUDE MED 24/CT (CLIP) ×3 IMPLANT
CLIP VESOCCLUDE MED 6/CT (CLIP) IMPLANT
CLIP VESOCCLUDE SM WIDE 24/CT (CLIP) ×3 IMPLANT
CLIP VESOCCLUDE SM WIDE 6/CT (CLIP) IMPLANT
COVER PROBE W GEL 5X96 (DRAPES) ×3 IMPLANT
COVER WAND RF STERILE (DRAPES) IMPLANT
DERMABOND ADVANCED (GAUZE/BANDAGES/DRESSINGS) ×2
DERMABOND ADVANCED .7 DNX12 (GAUZE/BANDAGES/DRESSINGS) ×1 IMPLANT
ELECT REM PT RETURN 9FT ADLT (ELECTROSURGICAL) ×3
ELECTRODE REM PT RTRN 9FT ADLT (ELECTROSURGICAL) ×1 IMPLANT
GLOVE BIO SURGEON STRL SZ7.5 (GLOVE) ×3 IMPLANT
GOWN STRL REUS W/ TWL LRG LVL3 (GOWN DISPOSABLE) ×2 IMPLANT
GOWN STRL REUS W/ TWL XL LVL3 (GOWN DISPOSABLE) ×1 IMPLANT
GOWN STRL REUS W/TWL LRG LVL3 (GOWN DISPOSABLE) ×4
GOWN STRL REUS W/TWL XL LVL3 (GOWN DISPOSABLE) ×2
KIT BASIN OR (CUSTOM PROCEDURE TRAY) ×3 IMPLANT
KIT TURNOVER KIT B (KITS) ×3 IMPLANT
NS IRRIG 1000ML POUR BTL (IV SOLUTION) ×3 IMPLANT
PACK CV ACCESS (CUSTOM PROCEDURE TRAY) ×3 IMPLANT
PAD ARMBOARD 7.5X6 YLW CONV (MISCELLANEOUS) ×6 IMPLANT
SUT MNCRL AB 4-0 PS2 18 (SUTURE) ×6 IMPLANT
SUT PROLENE 6 0 BV (SUTURE) ×6 IMPLANT
SUT SILK 2 0 PERMA HAND 18 BK (SUTURE) ×3 IMPLANT
SUT SILK 2 0 SH (SUTURE) IMPLANT
SUT VIC AB 3-0 SH 27 (SUTURE) ×4
SUT VIC AB 3-0 SH 27X BRD (SUTURE) ×2 IMPLANT
SYR TOOMEY IRRIG 70ML (MISCELLANEOUS) ×3
SYRINGE TOOMEY IRRIG 70ML (MISCELLANEOUS) ×1 IMPLANT
TOWEL GREEN STERILE (TOWEL DISPOSABLE) ×3 IMPLANT
UNDERPAD 30X36 HEAVY ABSORB (UNDERPADS AND DIAPERS) ×3 IMPLANT
WATER STERILE IRR 1000ML POUR (IV SOLUTION) ×3 IMPLANT

## 2019-11-30 NOTE — Progress Notes (Signed)
Patient BP 201/77 after 10 mg hydralazine. Per Dr. Smith Robert monitor patient for 20-30 minutes. Okay to discharge after this. Resume home meds once discharged.   Gabriel Rainwater, RN

## 2019-11-30 NOTE — Transfer of Care (Signed)
Immediate Anesthesia Transfer of Care Note  Patient: Trevor [name]  Procedure(s) Performed: LEFT SECOND STAGE BASCILIC VEIN TRANSPOSITION (Left Arm Upper)  Patient Location: PACU  Anesthesia Type:General  Level of Consciousness: awake and patient cooperative  Airway & Oxygen Therapy: Patient Spontanous Breathing  Post-op Assessment: Report given to RN, Post -op Vital signs reviewed and stable and Patient moving all extremities  Post vital signs: Reviewed and stable  Last Vitals:  Vitals Value Taken Time  BP    Temp    Pulse 57 11/30/19 0910  Resp 16 11/30/19 0910  SpO2 96 % 11/30/19 0910  Vitals shown include unvalidated device data.  Last Pain:  Vitals:   11/30/19 0638  PainSc: 0-No pain      Patients Stated Pain Goal: 4 (54/62/70 3500)  Complications: No complications documented.

## 2019-11-30 NOTE — Anesthesia Procedure Notes (Signed)
Procedure Name: Intubation Date/Time: 11/30/2019 7:38 AM Performed by: Trinna Post., CRNA Pre-anesthesia Checklist: Patient identified, Emergency Drugs available, Suction available and Patient being monitored Patient Re-evaluated:Patient Re-evaluated prior to induction Oxygen Delivery Method: Circle system utilized Preoxygenation: Pre-oxygenation with 100% oxygen Induction Type: IV induction Ventilation: Mask ventilation without difficulty LMA: LMA inserted LMA Size: 5.0 Number of attempts: 1 Placement Confirmation: ETT inserted through vocal cords under direct vision,  positive ETCO2 and breath sounds checked- equal and bilateral Tube secured with: Tape Dental Injury: Teeth and Oropharynx as per pre-operative assessment  Comments: Performed by Vallarie Mare, srna

## 2019-11-30 NOTE — Anesthesia Preprocedure Evaluation (Addendum)
Anesthesia Evaluation  Patient identified by MRN, date of birth, ID band Patient awake    Reviewed: Allergy & Precautions, NPO status , Patient's Chart, lab work & pertinent test results  Airway Mallampati: II  TM Distance: >3 FB Neck ROM: Full    Dental  (+) Missing,    Pulmonary neg pulmonary ROS,     + decreased breath sounds      Cardiovascular hypertension, Pt. on home beta blockers  Rhythm:Regular Rate:Normal     Neuro/Psych negative neurological ROS  negative psych ROS   GI/Hepatic negative GI ROS, (+) Hepatitis -  Endo/Other  diabetes  Renal/GU CRFRenal disease     Musculoskeletal   Abdominal (+) + obese,   Peds  Hematology  (+) anemia ,   Anesthesia Other Findings   Reproductive/Obstetrics                           Anesthesia Physical Anesthesia Plan  ASA: III  Anesthesia Plan: General   Post-op Pain Management:    Induction: Intravenous  PONV Risk Score and Plan: 3 and Ondansetron, Dexamethasone and Treatment may vary due to age or medical condition  Airway Management Planned:   Additional Equipment: None  Intra-op Plan:   Post-operative Plan: Extubation in OR  Informed Consent: I have reviewed the patients History and Physical, chart, labs and discussed the procedure including the risks, benefits and alternatives for the proposed anesthesia with the patient or authorized representative who has indicated his/her understanding and acceptance.     Dental advisory given  Plan Discussed with: CRNA  Anesthesia Plan Comments:        Anesthesia Quick Evaluation

## 2019-11-30 NOTE — H&P (Signed)
  POST OPERATIVE OFFICE NOTE     HPI:  This is a 79 y.o. male who is s/p left 1st stage BVT on 08/09/2019 by Dr. Donzetta Matters.  Pt states he does not have pain/numbness in left hand.     No Known Allergies        Current Outpatient Medications  Medication Sig Dispense Refill  . amLODipine (NORVASC) 10 MG tablet Take 10 mg by mouth at bedtime.     Marland Kitchen aspirin EC 81 MG tablet Take 81 mg by mouth daily.    . Cholecalciferol 25 MCG (1000 UT) tablet Take 1,000 Units by mouth daily.     . cloNIDine (CATAPRES) 0.2 MG tablet Take 0.2 mg by mouth 2 (two) times daily.     . furosemide (LASIX) 40 MG tablet Take 40 mg by mouth daily.     Marland Kitchen OVER THE COUNTER MEDICATION Take 2 tablets by mouth in the morning and at bedtime. ultimate cleanser otc    . OVER THE COUNTER MEDICATION Take 1 tablet by mouth in the morning and at bedtime. Kidney Cleanser    . oxyCODONE-acetaminophen (PERCOCET) 5-325 MG tablet Take 1 tablet by mouth every 6 (six) hours as needed. 8 tablet 0  . pioglitazone (ACTOS) 15 MG tablet Take 15 mg by mouth daily as needed (high blood sugar).     . rosuvastatin (CRESTOR) 10 MG tablet Take 10 mg by mouth daily.     . sodium bicarbonate 650 MG tablet Take 650 mg by mouth 3 (three) times daily.     . Tragacanth (ASTRAGALUS ROOT) POWD Take 1 Dose by mouth daily.    Tyler Aas FLEXTOUCH 200 UNIT/ML SOPN Inject 20 Units into the skin at bedtime as needed (high blood sugar).      No current facility-administered medications for this visit.     ROS:  See HPI  Physical Exam:  Vitals:   11/30/19 0633  BP: (!) 194/56  Pulse: 60  Resp: 17  Temp: 98.1 F (36.7 C)  SpO2: 99%     Incision:  Healed nicely Extremities:  There is a faintly palpable left radial pulse.  Motor and sensory are in tact.  There is a thrill/bruit present.   Dialysis Duplex on 10/20/2019: Diameter:  0.38cm-0.58cm Depth:  0.82cm-1.68cm  A/P: plan for left arm second stage bvt today in  OR   Kevontae Burgoon C. Donzetta Matters, MD [facility] [place] Office: [phone] Pager: [phone]

## 2019-11-30 NOTE — Anesthesia Postprocedure Evaluation (Signed)
Anesthesia Post Note  Patient: Trevor [name]  Procedure(s) Performed: LEFT SECOND STAGE BASCILIC VEIN TRANSPOSITION (Left Arm Upper)     Patient location during evaluation: PACU Anesthesia Type: General Level of consciousness: awake and alert Pain management: pain level controlled Vital Signs Assessment: post-procedure vital signs reviewed and stable Respiratory status: spontaneous breathing, nonlabored ventilation, respiratory function stable and patient connected to nasal cannula oxygen Cardiovascular status: blood pressure returned to baseline and stable Postop Assessment: no apparent nausea or vomiting Anesthetic complications: no   No complications documented.  Last Vitals:  Vitals:   11/30/19 0955 11/30/19 1010  BP: (!) 201/77 (!) 196/70  Pulse: 72 63  Resp: 20 20  Temp:  36.6 C  SpO2: 98% 98%    Last Pain:  Vitals:   11/30/19 1010  PainSc: [place] Vaidehi Braddy

## 2019-11-30 NOTE — Discharge Instructions (Signed)
° °  [facility]  ° °Discharge Instructions ° °AV Fistula or Graft Surgery for Dialysis Access ° °Please refer to the following instructions for your post-procedure care. Your surgeon or physician assistant will discuss any changes with you. ° °Activity ° °You may drive the day following your surgery, if you are comfortable and no longer taking prescription pain medication. Resume full activity as the soreness in your incision resolves. ° °Bathing/Showering ° °You may shower after you go home. Keep your incision dry for 48 hours. Do not soak in a bathtub, hot tub, or swim until the incision heals completely. You may not shower if you have a hemodialysis catheter. ° °Incision Care ° °Clean your incision with mild soap and water after 48 hours. Pat the area dry with a clean towel. You do not need a bandage unless otherwise instructed. Do not apply any ointments or creams to your incision. You may have skin glue on your incision. Do not peel it off. It will come off on its own in about one week. Your arm may swell a bit after surgery. To reduce swelling use pillows to elevate your arm so it is above your heart. Your doctor will tell you if you need to lightly wrap your arm with an ACE bandage. ° °Diet ° °Resume your normal diet. There are not special food restrictions following this procedure. In order to heal from your surgery, it is CRITICAL to get adequate nutrition. Your body requires vitamins, minerals, and protein. Vegetables are the best source of vitamins and minerals. Vegetables also provide the perfect balance of protein. Processed food has little nutritional value, so try to avoid this. ° °Medications ° °Resume taking all of your medications. If your incision is causing pain, you may take over-the counter pain relievers such as acetaminophen (Tylenol). If you were prescribed a stronger pain medication, please be aware these medications can cause nausea and constipation. Prevent  nausea by taking the medication with a snack or meal. Avoid constipation by drinking plenty of fluids and eating foods with high amount of fiber, such as fruits, vegetables, and grains. Do not take Tylenol if you are taking prescription pain medications. ° ° ° ° °Follow up °Your surgeon may want to see you in the office following your access surgery. If so, this will be arranged at the time of your surgery. ° °Please call us immediately for any of the following conditions: ° °Increased pain, redness, drainage (pus) from your incision site °Fever of 101 degrees or higher °Severe or worsening pain at your incision site °Hand pain or numbness. ° °Reduce your risk of vascular disease: ° °Stop smoking. If you would like help, call [facility] at 1-800-QUIT-NOW (1-800-784-8669) or [facility] at 336-586-4000 ° °Manage your cholesterol °Maintain a desired weight °Control your diabetes °Keep your blood pressure down ° °Dialysis ° °It will take several weeks to several months for your new dialysis access to be ready for use. Your surgeon will determine when it is OK to use it. Your nephrologist will continue to direct your dialysis. You can continue to use your Permcath until your new access is ready for use. ° °If you have any questions, please call the office at [phone]. ° °

## 2019-11-30 NOTE — Op Note (Signed)
    Patient name: Trevor [name] MRN: [id number] DOB: 1940/09/16 Sex: male  11/30/2019 Pre-operative Diagnosis: ckd 5 Post-operative diagnosis:  Same Surgeon:  Erlene Quan C. Donzetta Matters, MD Assistant: Arlee Muslim, PA Procedure Performed:   Left arm second basilic vein transposition fistula  Indications: 79 year old male had a history of chronic kidney disease underwent a first stage basilic vein fistula.  He is now indicated for the second stage.  Findings: Fistula self measured approximately 8 mm diameter throughout its course.  After transposition to the more lateral upper arm there was a very strong thrill confirmed with Doppler and a palpable radial artery pulse the wrist.   Procedure:  The patient was identified in the holding area and taken to the operating room where is placed upon the upper table and LMA anesthesia was induced.  He was sterilely prepped and draped in left upper extremity usual fashion antibiotics were ministered a timeout was called.  We began by opening his previous incision we dissected down to the level of fistula dissected this back to the anastomosis.  We then made 2 separate skip incisions up to the axilla dissecting out the entirety of fistula.  Branches were divided between clips and ties.  We did preserve the nerve throughout its course.  After we dissected out the entire fistula we then marked it for orientation.  We then clamped it near the antecubitum and divided it.  It was flushed with heparinized saline and clamped near the axilla.  We then flushed the to ensure there was no leaking.  Was tunneled laterally.  We then spatulated both ends and sewed them end-to-end with 6-0 Prolene suture.  Prior to completion we let flushing all directions.  Completion was a very strong thrill this was confirmed with Doppler.  We had a palpable radial artery pulse at the wrist also confirmed with Doppler.  We then irrigated the wounds obtain hemostasis and closed in layers of Vicryl and  Monocryl.  Dermabond was placed to the level of skin.  He was then awakened from anesthesia having tolerated procedure without any complication.  All counts were correct at completion.  EBL: 50 cc   Samantha Olivera C. Donzetta Matters, MD [facility] [place] Office: [phone] Pager: [phone]

## 2019-12-01 ENCOUNTER — Encounter (HOSPITAL_COMMUNITY): Payer: Self-pay | Admitting: Vascular Surgery

## 2019-12-11 DIAGNOSIS — N189 Chronic kidney disease, unspecified: Secondary | ICD-10-CM | POA: Diagnosis not present

## 2019-12-11 DIAGNOSIS — E211 Secondary hyperparathyroidism, not elsewhere classified: Secondary | ICD-10-CM | POA: Diagnosis not present

## 2019-12-11 DIAGNOSIS — N185 Chronic kidney disease, stage 5: Secondary | ICD-10-CM | POA: Diagnosis not present

## 2019-12-11 DIAGNOSIS — D631 Anemia in chronic kidney disease: Secondary | ICD-10-CM | POA: Diagnosis not present

## 2019-12-11 DIAGNOSIS — E1122 Type 2 diabetes mellitus with diabetic chronic kidney disease: Secondary | ICD-10-CM | POA: Diagnosis not present

## 2019-12-11 DIAGNOSIS — I129 Hypertensive chronic kidney disease with stage 1 through stage 4 chronic kidney disease, or unspecified chronic kidney disease: Secondary | ICD-10-CM | POA: Diagnosis not present

## 2019-12-11 DIAGNOSIS — E872 Acidosis: Secondary | ICD-10-CM | POA: Diagnosis not present

## 2019-12-13 DIAGNOSIS — D631 Anemia in chronic kidney disease: Secondary | ICD-10-CM | POA: Diagnosis not present

## 2019-12-13 DIAGNOSIS — R809 Proteinuria, unspecified: Secondary | ICD-10-CM | POA: Diagnosis not present

## 2019-12-13 DIAGNOSIS — I129 Hypertensive chronic kidney disease with stage 1 through stage 4 chronic kidney disease, or unspecified chronic kidney disease: Secondary | ICD-10-CM | POA: Diagnosis not present

## 2019-12-13 DIAGNOSIS — E211 Secondary hyperparathyroidism, not elsewhere classified: Secondary | ICD-10-CM | POA: Diagnosis not present

## 2019-12-13 DIAGNOSIS — E1129 Type 2 diabetes mellitus with other diabetic kidney complication: Secondary | ICD-10-CM | POA: Diagnosis not present

## 2019-12-13 DIAGNOSIS — E872 Acidosis: Secondary | ICD-10-CM | POA: Diagnosis not present

## 2019-12-13 DIAGNOSIS — N189 Chronic kidney disease, unspecified: Secondary | ICD-10-CM | POA: Diagnosis not present

## 2019-12-13 DIAGNOSIS — N185 Chronic kidney disease, stage 5: Secondary | ICD-10-CM | POA: Diagnosis not present

## 2019-12-13 DIAGNOSIS — E1122 Type 2 diabetes mellitus with diabetic chronic kidney disease: Secondary | ICD-10-CM | POA: Diagnosis not present

## 2019-12-28 ENCOUNTER — Ambulatory Visit (INDEPENDENT_AMBULATORY_CARE_PROVIDER_SITE_OTHER): Payer: Self-pay | Admitting: Physician Assistant

## 2019-12-28 DIAGNOSIS — N185 Chronic kidney disease, stage 5: Secondary | ICD-10-CM

## 2019-12-28 NOTE — Progress Notes (Addendum)
POST OPERATIVE OFFICE NOTE    CC:  F/u for surgery  HPI:  This is a 79 y.o. male who is s/p left 2nd stage BVT on 11/30/2019 by Dr. Donzetta Matters .  The 1st stage BVT was performed on 08/09/2019 also by Dr. Donzetta Matters.    He comes in today for follow up.  Pt states he does not have pain/numbness in his left hand.   He does have a bit of numbness on the back of the forearm that he states it is continuing to get better.  He states he has had some swelling in the left upper arm that has improved.  He also had some throbbing in the upper arm.   The pt is not yet on dialysis and his nephrologist is Dr. Theador Hawthorne.     No Known Allergies  Current Outpatient Medications  Medication Sig Dispense Refill  . ACCU-CHEK AVIVA PLUS test strip     . Accu-Chek Softclix Lancets lancets     . Alcohol Swabs (B-D SINGLE USE SWABS REGULAR) PADS     . aspirin EC 81 MG tablet Take 81 mg by mouth daily.    . carvedilol (COREG) 3.125 MG tablet Take 6.25 mg by mouth 2 (two) times daily with a meal.     . Cholecalciferol 25 MCG (1000 UT) tablet Take 1,000 Units by mouth daily.     . cloNIDine (CATAPRES) 0.2 MG tablet Take 0.2 mg by mouth 3 (three) times daily.     . furosemide (LASIX) 40 MG tablet Take 40 mg by mouth 2 (two) times daily.     . Garlic 4580 MG CAPS Take 1,000 mg by mouth daily.    Marland Kitchen HYDROcodone-acetaminophen (NORCO) 5-325 MG tablet Take 1 tablet by mouth every 6 (six) hours as needed for moderate pain. 10 tablet 0  . OVER THE COUNTER MEDICATION Take 1 tablet by mouth daily. Kidney Cleanser     . rosuvastatin (CRESTOR) 10 MG tablet Take 10 mg by mouth daily.     . sodium bicarbonate 650 MG tablet Take 650 mg by mouth 3 (three) times daily.     . TRESIBA FLEXTOUCH 200 UNIT/ML SOPN Inject 5-20 Units into the skin at bedtime as needed (high blood sugar). Sliding scale     No current facility-administered medications for this visit.     ROS:  See HPI  Physical Exam:  Incision:  All incisions have healed  nicely. Extremities:   There is a palpable left radial pulse.   Motor and sensory are in tact.   There is a thrill/bruit present and is easily palpable    Assessment/Plan:  This is a 79 y.o. male who is s/p:  left 2nd stage BVT on 11/30/2019 by Dr. Donzetta Matters .  The 1st stage BVT was performed on 08/09/2019 also by Dr. Donzetta Matters.    -the pt does not have evidence of steal. -the swelling in the upper arm is not unexpected and has been improving.  The numbness on the forearm is most likely related to nerve irritation and has improved and should continue to improve over time.   -the fistula can be used should he need dialysis   -I did discuss with the pt that these accesses for dialysis do not usually last forever and the fistula will most likely require maintenance in the future or even new access and he expressed understanding.  -the pt will follow up as needed.   Leontine Locket, [facility] [phone]  Clinic MD:  [place] on call MD

## 2019-12-28 NOTE — Progress Notes (Deleted)
  POST OPERATIVE OFFICE NOTE    CC:  F/u for surgery  HPI:  This is a 79 y.o. male who is s/p left 2nd stage BVT on 11/30/2019 by Dr. Donzetta Matters .  The 1st stage BVT was performed on 08/09/2019 also by Dr. Donzetta Matters.    Pt returns today for follow up.  ***  Pt states ***he does *** have pain/numbness in *** hand.    The pt *** on dialysis *** at *** location.   No Known Allergies  Current Outpatient Medications  Medication Sig Dispense Refill  . ACCU-CHEK AVIVA PLUS test strip     . Accu-Chek Softclix Lancets lancets     . Alcohol Swabs (B-D SINGLE USE SWABS REGULAR) PADS     . aspirin EC 81 MG tablet Take 81 mg by mouth daily.    . carvedilol (COREG) 3.125 MG tablet Take 6.25 mg by mouth 2 (two) times daily with a meal.     . Cholecalciferol 25 MCG (1000 UT) tablet Take 1,000 Units by mouth daily.     . cloNIDine (CATAPRES) 0.2 MG tablet Take 0.2 mg by mouth 3 (three) times daily.     . furosemide (LASIX) 40 MG tablet Take 40 mg by mouth 2 (two) times daily.     . Garlic 5929 MG CAPS Take 1,000 mg by mouth daily.    Marland Kitchen HYDROcodone-acetaminophen (NORCO) 5-325 MG tablet Take 1 tablet by mouth every 6 (six) hours as needed for moderate pain. 10 tablet 0  . OVER THE COUNTER MEDICATION Take 1 tablet by mouth daily. Kidney Cleanser     . rosuvastatin (CRESTOR) 10 MG tablet Take 10 mg by mouth daily.     . sodium bicarbonate 650 MG tablet Take 650 mg by mouth 3 (three) times daily.     . TRESIBA FLEXTOUCH 200 UNIT/ML SOPN Inject 5-20 Units into the skin at bedtime as needed (high blood sugar). Sliding scale     No current facility-administered medications for this visit.     ROS:  See HPI  Physical Exam:  ***  Incision:  *** Extremities:   There *** a palpable *** pulse.   Motor and sensory *** in tact.   There *** a thrill/bruit present.    Dialysis Duplex on ***: Diameter:  *** Depth:  ***   Assessment/Plan:  This is a 79 y.o. male who is s/p: ***  -the pt does *** have  evidence of steal. -the fistula/graft can be used ***. -If pt has a tunneled dialysis catheter and the access has been used successfully to the satisfaction of the dialysis center, the tunneled catheter can be removed at their discretion.   -the pt will follow up ***   *** [facility] [phone]  Clinic MD:  ***

## 2020-01-23 DIAGNOSIS — N189 Chronic kidney disease, unspecified: Secondary | ICD-10-CM | POA: Diagnosis not present

## 2020-01-23 DIAGNOSIS — R809 Proteinuria, unspecified: Secondary | ICD-10-CM | POA: Diagnosis not present

## 2020-01-23 DIAGNOSIS — E872 Acidosis: Secondary | ICD-10-CM | POA: Diagnosis not present

## 2020-01-23 DIAGNOSIS — N185 Chronic kidney disease, stage 5: Secondary | ICD-10-CM | POA: Diagnosis not present

## 2020-01-23 DIAGNOSIS — E1122 Type 2 diabetes mellitus with diabetic chronic kidney disease: Secondary | ICD-10-CM | POA: Diagnosis not present

## 2020-01-23 DIAGNOSIS — E1129 Type 2 diabetes mellitus with other diabetic kidney complication: Secondary | ICD-10-CM | POA: Diagnosis not present

## 2020-01-23 DIAGNOSIS — I129 Hypertensive chronic kidney disease with stage 1 through stage 4 chronic kidney disease, or unspecified chronic kidney disease: Secondary | ICD-10-CM | POA: Diagnosis not present

## 2020-01-23 DIAGNOSIS — E211 Secondary hyperparathyroidism, not elsewhere classified: Secondary | ICD-10-CM | POA: Diagnosis not present

## 2020-01-23 DIAGNOSIS — D631 Anemia in chronic kidney disease: Secondary | ICD-10-CM | POA: Diagnosis not present

## 2020-01-24 DIAGNOSIS — E1122 Type 2 diabetes mellitus with diabetic chronic kidney disease: Secondary | ICD-10-CM | POA: Diagnosis not present

## 2020-01-24 DIAGNOSIS — I129 Hypertensive chronic kidney disease with stage 1 through stage 4 chronic kidney disease, or unspecified chronic kidney disease: Secondary | ICD-10-CM | POA: Diagnosis not present

## 2020-01-24 DIAGNOSIS — D631 Anemia in chronic kidney disease: Secondary | ICD-10-CM | POA: Diagnosis not present

## 2020-01-24 DIAGNOSIS — E1129 Type 2 diabetes mellitus with other diabetic kidney complication: Secondary | ICD-10-CM | POA: Diagnosis not present

## 2020-01-24 DIAGNOSIS — N185 Chronic kidney disease, stage 5: Secondary | ICD-10-CM | POA: Diagnosis not present

## 2020-01-24 DIAGNOSIS — R809 Proteinuria, unspecified: Secondary | ICD-10-CM | POA: Diagnosis not present

## 2020-01-24 DIAGNOSIS — E211 Secondary hyperparathyroidism, not elsewhere classified: Secondary | ICD-10-CM | POA: Diagnosis not present

## 2020-01-24 DIAGNOSIS — N189 Chronic kidney disease, unspecified: Secondary | ICD-10-CM | POA: Diagnosis not present

## 2020-01-24 DIAGNOSIS — N400 Enlarged prostate without lower urinary tract symptoms: Secondary | ICD-10-CM | POA: Diagnosis not present

## 2020-02-15 DIAGNOSIS — R809 Proteinuria, unspecified: Secondary | ICD-10-CM | POA: Diagnosis not present

## 2020-02-15 DIAGNOSIS — K219 Gastro-esophageal reflux disease without esophagitis: Secondary | ICD-10-CM | POA: Diagnosis not present

## 2020-02-15 DIAGNOSIS — Z0000 Encounter for general adult medical examination without abnormal findings: Secondary | ICD-10-CM | POA: Diagnosis not present

## 2020-02-15 DIAGNOSIS — E1140 Type 2 diabetes mellitus with diabetic neuropathy, unspecified: Secondary | ICD-10-CM | POA: Diagnosis not present

## 2020-02-15 DIAGNOSIS — E782 Mixed hyperlipidemia: Secondary | ICD-10-CM | POA: Diagnosis not present

## 2020-02-15 DIAGNOSIS — E1122 Type 2 diabetes mellitus with diabetic chronic kidney disease: Secondary | ICD-10-CM | POA: Diagnosis not present

## 2020-02-15 DIAGNOSIS — I10 Essential (primary) hypertension: Secondary | ICD-10-CM | POA: Diagnosis not present

## 2020-02-15 DIAGNOSIS — N184 Chronic kidney disease, stage 4 (severe): Secondary | ICD-10-CM | POA: Diagnosis not present

## 2020-02-15 DIAGNOSIS — D631 Anemia in chronic kidney disease: Secondary | ICD-10-CM | POA: Diagnosis not present

## 2020-02-15 LAB — LIPID PANEL
Cholesterol: 130 (ref 0–200)
HDL: 46 (ref 35–70)
LDL Cholesterol: 73
Triglycerides: 46 (ref 40–160)

## 2020-02-15 LAB — BASIC METABOLIC PANEL
BUN: 51 — AB (ref 4–21)
Creatinine: 5 — AB (ref 0.6–1.3)

## 2020-02-15 LAB — COMPREHENSIVE METABOLIC PANEL
Albumin: 3.4 — AB (ref 3.5–5.0)
Calcium: 8.7 (ref 8.7–10.7)

## 2020-02-21 DIAGNOSIS — R600 Localized edema: Secondary | ICD-10-CM | POA: Diagnosis not present

## 2020-02-21 DIAGNOSIS — E782 Mixed hyperlipidemia: Secondary | ICD-10-CM | POA: Diagnosis not present

## 2020-02-21 DIAGNOSIS — N184 Chronic kidney disease, stage 4 (severe): Secondary | ICD-10-CM | POA: Diagnosis not present

## 2020-02-21 DIAGNOSIS — E1121 Type 2 diabetes mellitus with diabetic nephropathy: Secondary | ICD-10-CM | POA: Diagnosis not present

## 2020-02-21 DIAGNOSIS — Z86010 Personal history of colonic polyps: Secondary | ICD-10-CM | POA: Diagnosis not present

## 2020-02-21 DIAGNOSIS — D631 Anemia in chronic kidney disease: Secondary | ICD-10-CM | POA: Diagnosis not present

## 2020-02-21 DIAGNOSIS — E1122 Type 2 diabetes mellitus with diabetic chronic kidney disease: Secondary | ICD-10-CM | POA: Diagnosis not present

## 2020-02-21 DIAGNOSIS — I10 Essential (primary) hypertension: Secondary | ICD-10-CM | POA: Diagnosis not present

## 2020-02-21 DIAGNOSIS — Z0001 Encounter for general adult medical examination with abnormal findings: Secondary | ICD-10-CM | POA: Diagnosis not present

## 2020-02-21 DIAGNOSIS — R0789 Other chest pain: Secondary | ICD-10-CM | POA: Diagnosis not present

## 2020-03-28 DIAGNOSIS — R809 Proteinuria, unspecified: Secondary | ICD-10-CM | POA: Diagnosis not present

## 2020-03-28 DIAGNOSIS — E1129 Type 2 diabetes mellitus with other diabetic kidney complication: Secondary | ICD-10-CM | POA: Diagnosis not present

## 2020-03-28 DIAGNOSIS — E211 Secondary hyperparathyroidism, not elsewhere classified: Secondary | ICD-10-CM | POA: Diagnosis not present

## 2020-03-28 DIAGNOSIS — N185 Chronic kidney disease, stage 5: Secondary | ICD-10-CM | POA: Diagnosis not present

## 2020-03-28 DIAGNOSIS — N189 Chronic kidney disease, unspecified: Secondary | ICD-10-CM | POA: Diagnosis not present

## 2020-03-28 DIAGNOSIS — D631 Anemia in chronic kidney disease: Secondary | ICD-10-CM | POA: Diagnosis not present

## 2020-03-28 DIAGNOSIS — I129 Hypertensive chronic kidney disease with stage 1 through stage 4 chronic kidney disease, or unspecified chronic kidney disease: Secondary | ICD-10-CM | POA: Diagnosis not present

## 2020-03-28 DIAGNOSIS — E1122 Type 2 diabetes mellitus with diabetic chronic kidney disease: Secondary | ICD-10-CM | POA: Diagnosis not present

## 2020-03-29 DIAGNOSIS — I129 Hypertensive chronic kidney disease with stage 1 through stage 4 chronic kidney disease, or unspecified chronic kidney disease: Secondary | ICD-10-CM | POA: Diagnosis not present

## 2020-03-29 DIAGNOSIS — D631 Anemia in chronic kidney disease: Secondary | ICD-10-CM | POA: Diagnosis not present

## 2020-03-29 DIAGNOSIS — E1122 Type 2 diabetes mellitus with diabetic chronic kidney disease: Secondary | ICD-10-CM | POA: Diagnosis not present

## 2020-03-29 DIAGNOSIS — N189 Chronic kidney disease, unspecified: Secondary | ICD-10-CM | POA: Diagnosis not present

## 2020-03-29 DIAGNOSIS — E875 Hyperkalemia: Secondary | ICD-10-CM | POA: Diagnosis not present

## 2020-03-29 DIAGNOSIS — N185 Chronic kidney disease, stage 5: Secondary | ICD-10-CM | POA: Diagnosis not present

## 2020-03-29 DIAGNOSIS — E211 Secondary hyperparathyroidism, not elsewhere classified: Secondary | ICD-10-CM | POA: Diagnosis not present

## 2020-03-29 DIAGNOSIS — R600 Localized edema: Secondary | ICD-10-CM | POA: Diagnosis not present

## 2020-04-08 DIAGNOSIS — I129 Hypertensive chronic kidney disease with stage 1 through stage 4 chronic kidney disease, or unspecified chronic kidney disease: Secondary | ICD-10-CM | POA: Diagnosis not present

## 2020-04-08 DIAGNOSIS — E211 Secondary hyperparathyroidism, not elsewhere classified: Secondary | ICD-10-CM | POA: Diagnosis not present

## 2020-04-08 DIAGNOSIS — E1122 Type 2 diabetes mellitus with diabetic chronic kidney disease: Secondary | ICD-10-CM | POA: Diagnosis not present

## 2020-04-08 DIAGNOSIS — N185 Chronic kidney disease, stage 5: Secondary | ICD-10-CM | POA: Diagnosis not present

## 2020-04-08 DIAGNOSIS — N189 Chronic kidney disease, unspecified: Secondary | ICD-10-CM | POA: Diagnosis not present

## 2020-04-08 DIAGNOSIS — E875 Hyperkalemia: Secondary | ICD-10-CM | POA: Diagnosis not present

## 2020-04-08 DIAGNOSIS — D631 Anemia in chronic kidney disease: Secondary | ICD-10-CM | POA: Diagnosis not present

## 2020-04-30 ENCOUNTER — Other Ambulatory Visit: Payer: Self-pay

## 2020-04-30 ENCOUNTER — Emergency Department (HOSPITAL_COMMUNITY): Payer: Medicare HMO

## 2020-04-30 ENCOUNTER — Encounter (HOSPITAL_COMMUNITY): Payer: Self-pay | Admitting: *Deleted

## 2020-04-30 ENCOUNTER — Emergency Department (HOSPITAL_COMMUNITY)
Admission: EM | Admit: 2020-04-30 | Discharge: 2020-04-30 | Disposition: A | Discharge status: Home / Self Care | Payer: Medicare HMO | Attending: Emergency Medicine | Admitting: Emergency Medicine

## 2020-04-30 DIAGNOSIS — W009XXA Unspecified fall due to ice and snow, initial encounter: Secondary | ICD-10-CM | POA: Insufficient documentation

## 2020-04-30 DIAGNOSIS — Z7982 Long term (current) use of aspirin: Secondary | ICD-10-CM | POA: Insufficient documentation

## 2020-04-30 DIAGNOSIS — I129 Hypertensive chronic kidney disease with stage 1 through stage 4 chronic kidney disease, or unspecified chronic kidney disease: Secondary | ICD-10-CM | POA: Diagnosis not present

## 2020-04-30 DIAGNOSIS — N184 Chronic kidney disease, stage 4 (severe): Secondary | ICD-10-CM | POA: Diagnosis not present

## 2020-04-30 DIAGNOSIS — G44319 Acute post-traumatic headache, not intractable: Secondary | ICD-10-CM | POA: Diagnosis not present

## 2020-04-30 DIAGNOSIS — E211 Secondary hyperparathyroidism, not elsewhere classified: Secondary | ICD-10-CM | POA: Diagnosis not present

## 2020-04-30 DIAGNOSIS — M542 Cervicalgia: Secondary | ICD-10-CM | POA: Diagnosis not present

## 2020-04-30 DIAGNOSIS — Z79899 Other long term (current) drug therapy: Secondary | ICD-10-CM | POA: Insufficient documentation

## 2020-04-30 DIAGNOSIS — Z794 Long term (current) use of insulin: Secondary | ICD-10-CM | POA: Diagnosis not present

## 2020-04-30 DIAGNOSIS — R519 Headache, unspecified: Secondary | ICD-10-CM | POA: Diagnosis not present

## 2020-04-30 DIAGNOSIS — N189 Chronic kidney disease, unspecified: Secondary | ICD-10-CM | POA: Diagnosis not present

## 2020-04-30 DIAGNOSIS — M25512 Pain in left shoulder: Secondary | ICD-10-CM | POA: Insufficient documentation

## 2020-04-30 DIAGNOSIS — E1122 Type 2 diabetes mellitus with diabetic chronic kidney disease: Secondary | ICD-10-CM | POA: Insufficient documentation

## 2020-04-30 DIAGNOSIS — W19XXXA Unspecified fall, initial encounter: Secondary | ICD-10-CM

## 2020-04-30 DIAGNOSIS — N185 Chronic kidney disease, stage 5: Secondary | ICD-10-CM | POA: Diagnosis not present

## 2020-04-30 DIAGNOSIS — D631 Anemia in chronic kidney disease: Secondary | ICD-10-CM | POA: Diagnosis not present

## 2020-04-30 DIAGNOSIS — E875 Hyperkalemia: Secondary | ICD-10-CM | POA: Diagnosis not present

## 2020-04-30 LAB — BASIC METABOLIC PANEL
BUN: 60 — AB (ref 4–21)
Creatinine: 5 — AB (ref 0.6–1.3)

## 2020-04-30 LAB — COMPREHENSIVE METABOLIC PANEL
GFR calc Af Amer: 12
GFR calc non Af Amer: 10

## 2020-04-30 IMAGING — CT CT HEAD W/O CM
3 series · 15 of 47 positions shown, 18 images · non-contrast
Comparison: [DATE]

CLINICAL DATA: Recent slip and fall with headaches and neck pain,
initial encounter

EXAM:
CT HEAD WITHOUT CONTRAST
CT CERVICAL SPINE WITHOUT CONTRAST
TECHNIQUE: Multidetector CT imaging of the head and cervical spine was
performed following the standard protocol without intravenous
contrast. Multiplanar CT image reconstructions of the cervical spine
were also generated.

[Series 2: head w o · axial · 0.49mm/px · z∈[-23,+112]mm · 9 of 33 slices shown, 12 images]
[im 3/33  brain]
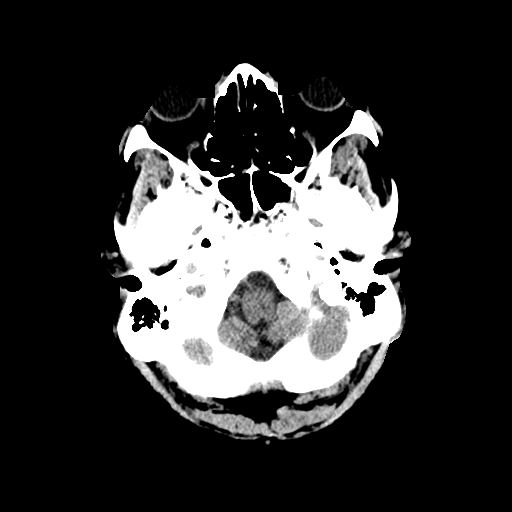
[im 3/33  bone]
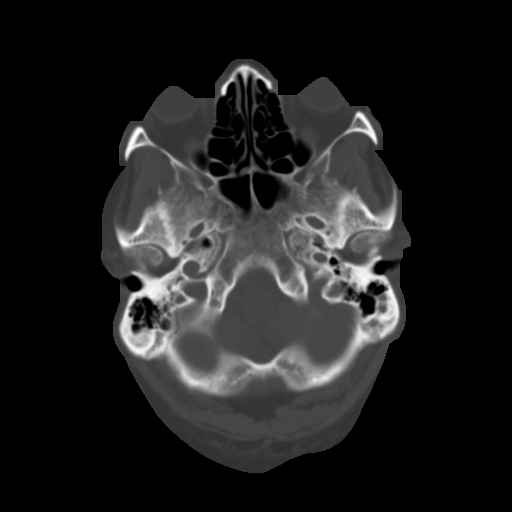
[im 6/33  brain]
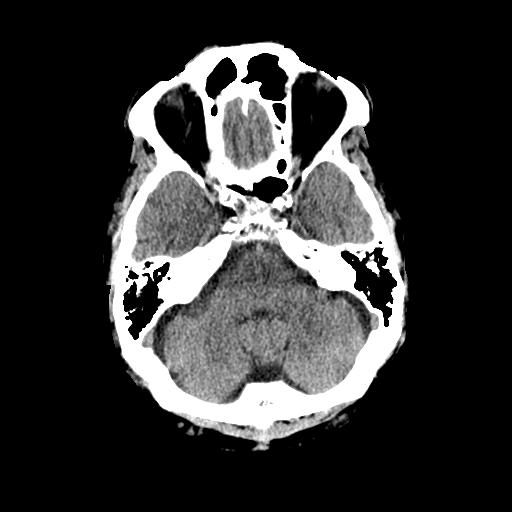
[im 9/33  brain]
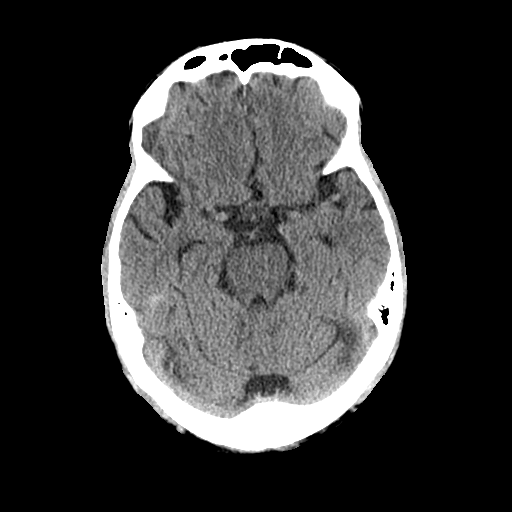
[im 13/33  brain]
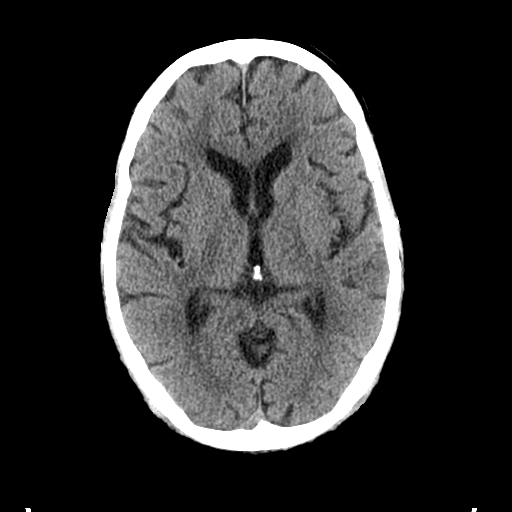
[im 17/33  brain]
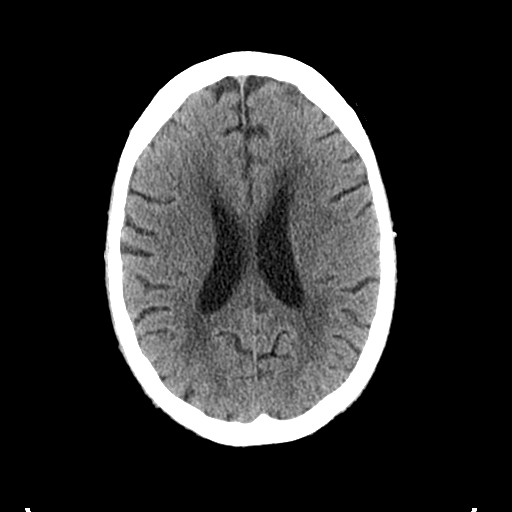
[im 17/33  bone]
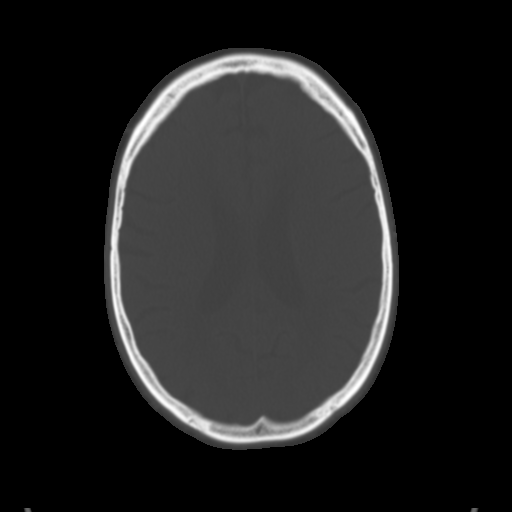
[im 20/33  brain]
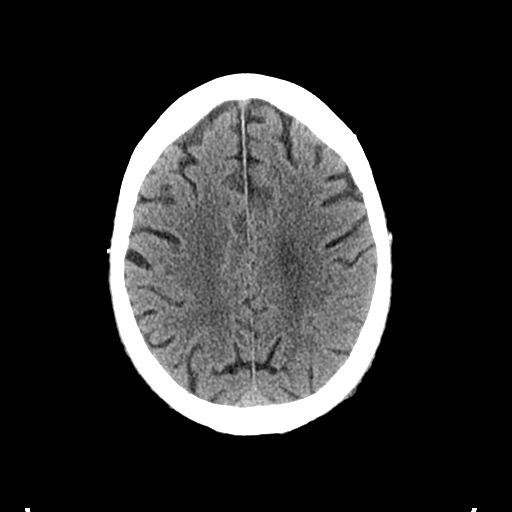
[im 24/33  brain]
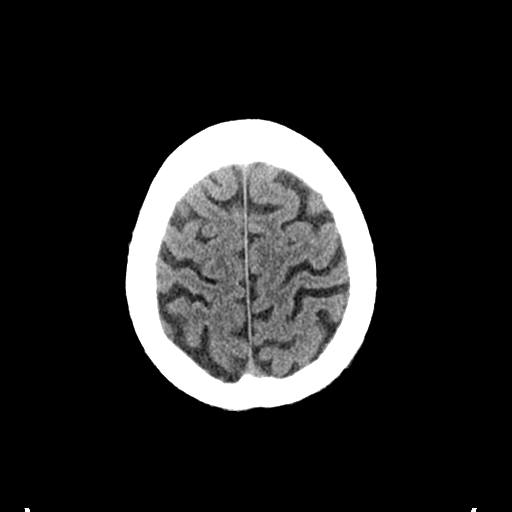
[im 27/33  brain]
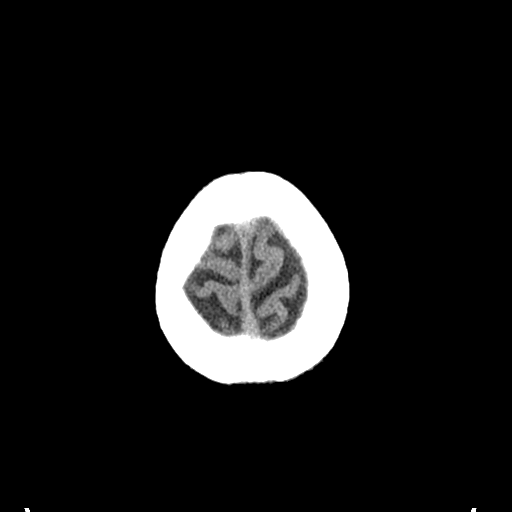
[im 30/33  brain]
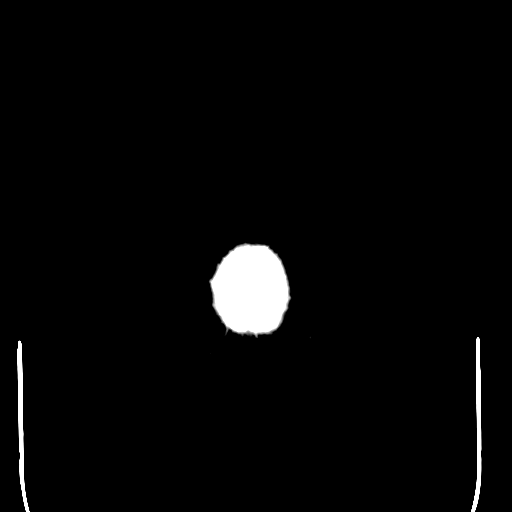
[im 30/33  bone]
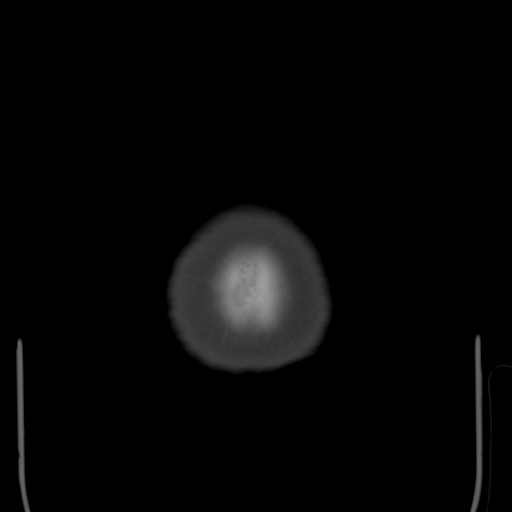

[Series 4: coronal soft · coronal · 0.33mm/px · 3 of 73 slices shown]
[im 25/73  brain]
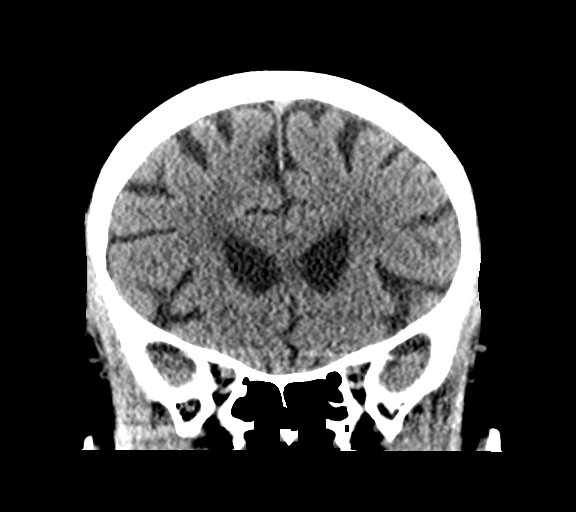
[im 33/73  brain]
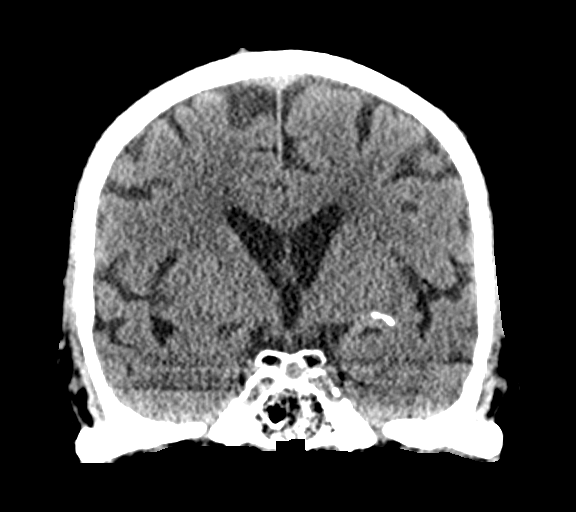
[im 41/73  brain]
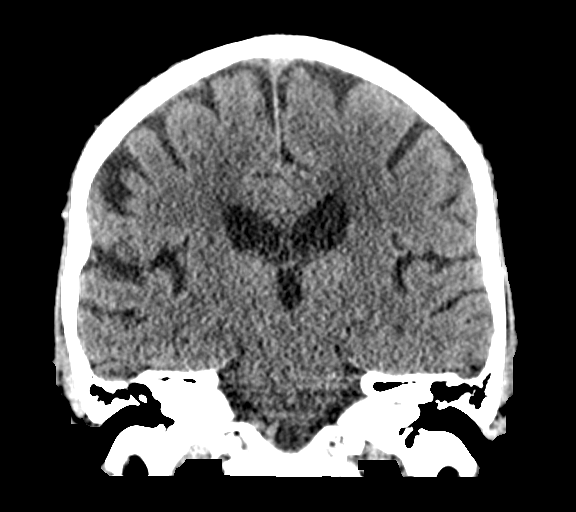

[Series 5: sagittal soft · sagittal · 0.35mm/px · 3 of 64 slices shown]
[im 22/64  brain]
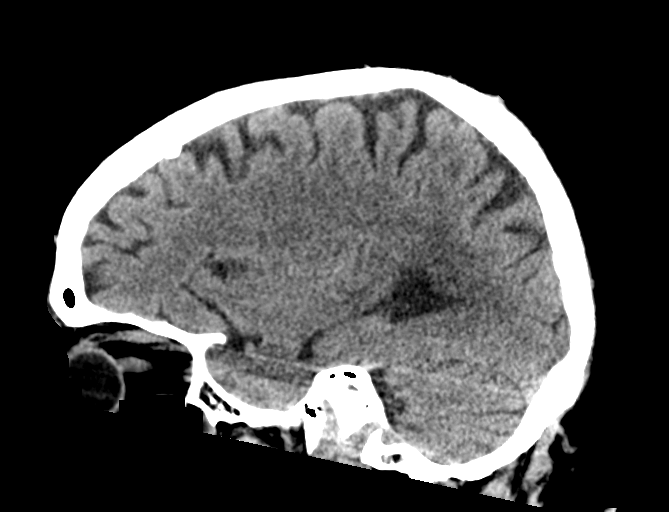
[im 32/64  brain]
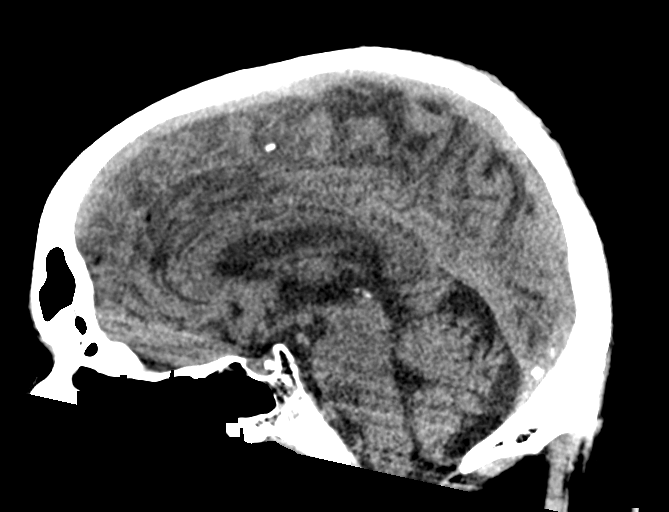
[im 43/64  brain]
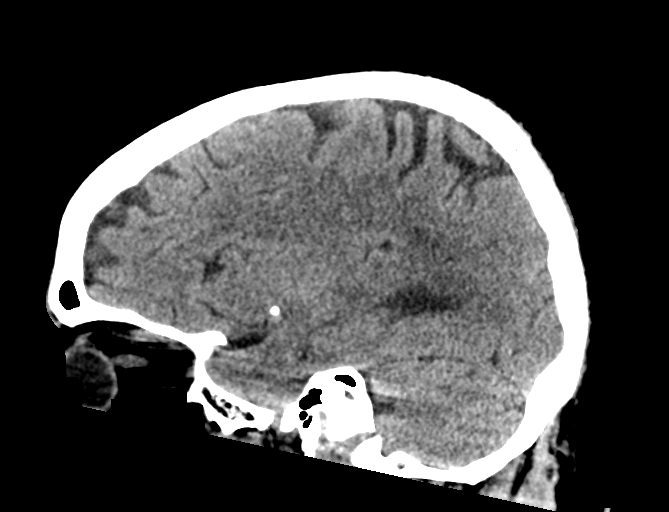

[15 of 47 positions shown; findings below may reference images not displayed]

FINDINGS: CT HEAD FINDINGS

Brain: No evidence of acute infarction, hemorrhage, hydrocephalus,
extra-axial collection or mass lesion/mass effect.

Vascular: No hyperdense vessel is noted. Calcifications in the left
middle cerebral artery distally are noted.

Skull: Normal. Negative for fracture or focal lesion.

Sinuses/Orbits: No acute finding.

Other: None.

CT CERVICAL SPINE FINDINGS

Alignment: Mild straightening of the normal cervical lordosis is
noted likely related to muscular spasm.

Skull base and vertebrae: 7 cervical segments are well visualized.
Vertebral body height is well maintained. Mild disc space narrowing
at C5-6 and C6-7 is noted with associated osteophytic change.

Soft tissues and spinal canal: Surrounding soft tissue structures
demonstrate a vague hypodense nodule measuring 16 mm in the right
lobe of thyroid. This was not well visualized on prior exam. No
other focal soft tissue abnormality is seen.

Upper chest: Visualized lung apices are within normal limits.

Other: None
IMPRESSION: CT of the head: No acute intracranial abnormality noted.

CT of cervical spine: Degenerative change as described.

Right thyroid nodule as described Recommend thyroid US (ref: [HOSPITAL]. [DATE]): 143-50).

## 2020-04-30 IMAGING — CT CT CERVICAL SPINE W/O CM
4 series · 14 of 33 positions shown, 17 images · non-contrast
Comparison: [DATE]

CLINICAL DATA: Recent slip and fall with headaches and neck pain,
initial encounter

EXAM:
CT HEAD WITHOUT CONTRAST
CT CERVICAL SPINE WITHOUT CONTRAST
TECHNIQUE: Multidetector CT imaging of the head and cervical spine was
performed following the standard protocol without intravenous
contrast. Multiplanar CT image reconstructions of the cervical spine
were also generated.

[Series 4: c spine soft · axial · 0.37mm/px · 1 of 99 slices shown]
[im 17/99  soft-tissue]
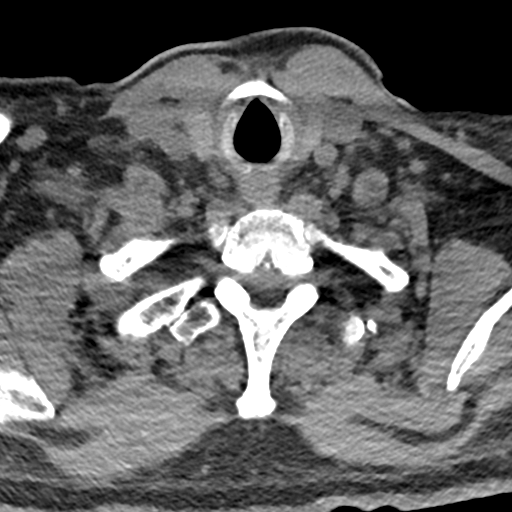

[Series 5: sagittal bone · sagittal · 0.33mm/px · 5 of 113 slices shown, 6 images]
[im 38/113  bone]
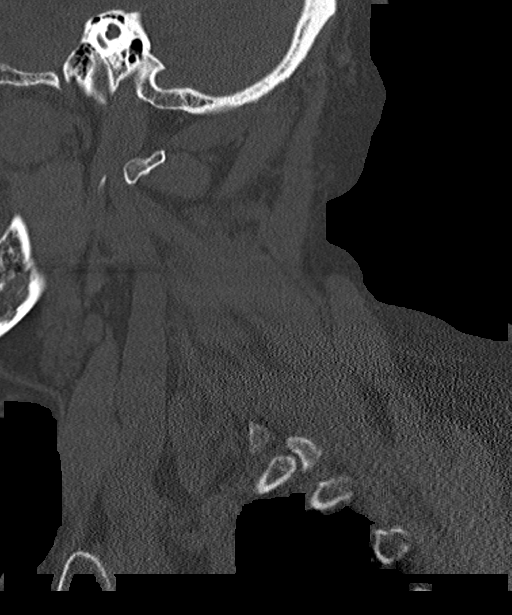
[im 47/113  bone]
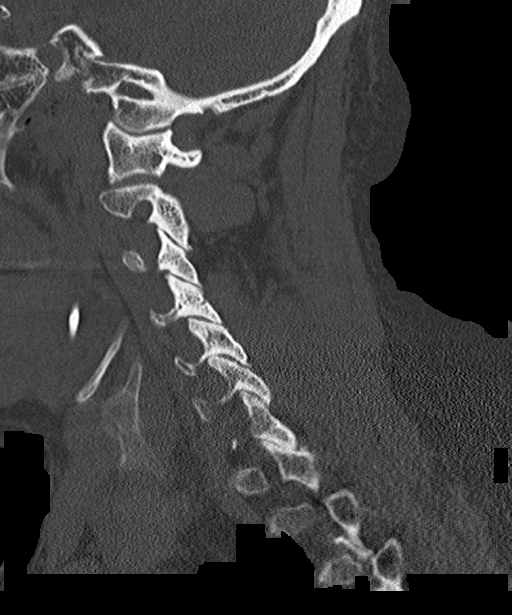
[im 57/113  soft-tissue]
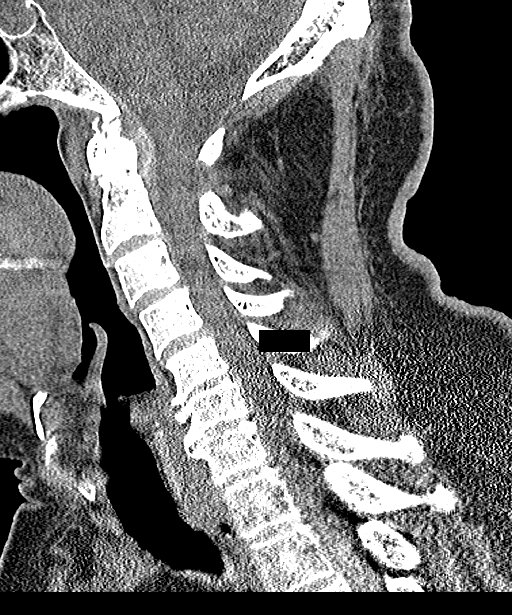
[im 57/113  bone]
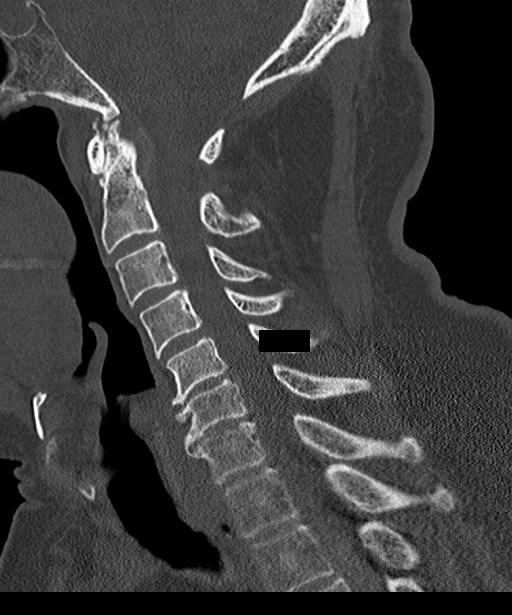
[im 66/113  bone]
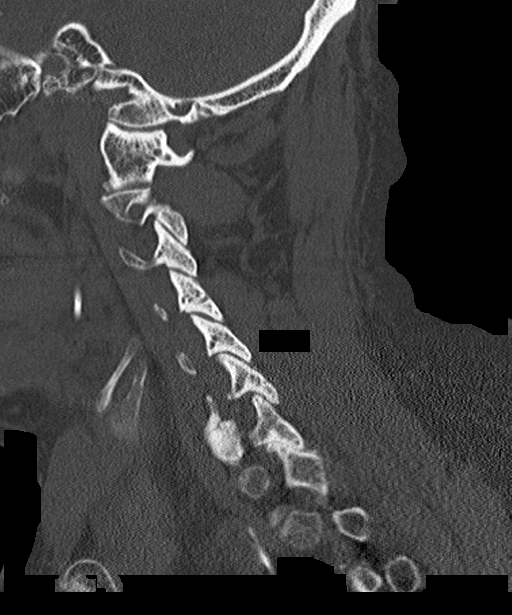
[im 75/113  bone]
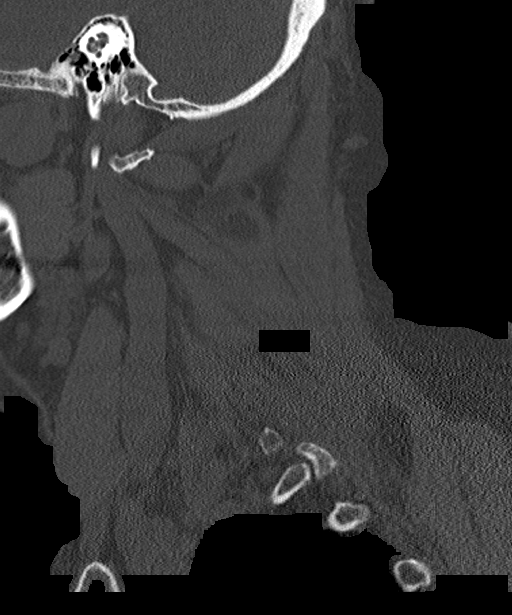

[Series 6: coronal bone · coronal · 0.38mm/px · 3 of 81 slices shown]
[im 22/81  bone]
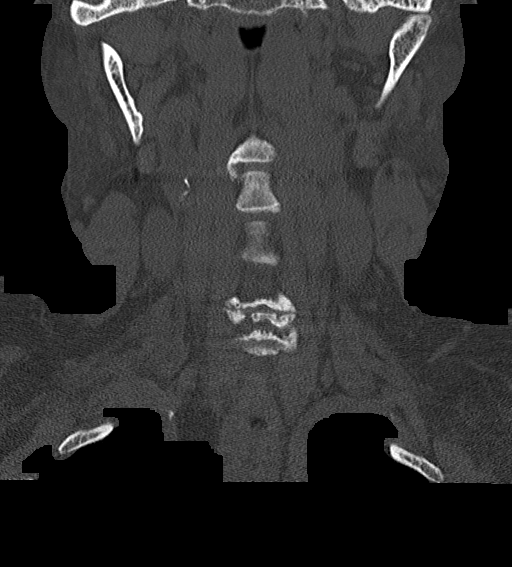
[im 34/81  bone]
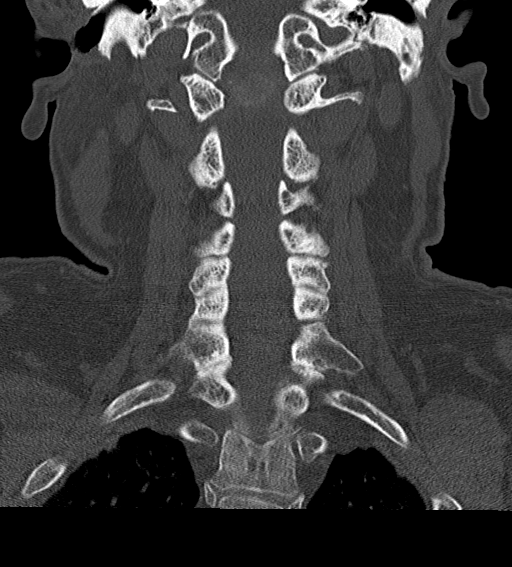
[im 47/81  bone]
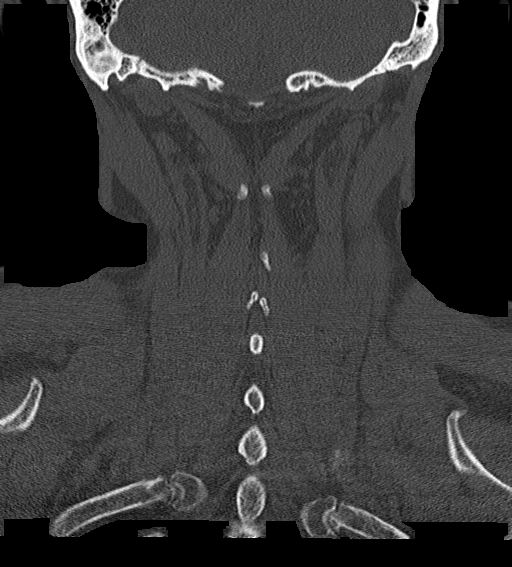

[Series 7: orthogonal axials · axial · 0.21mm/px · z∈[-180,-76]mm · 5 of 92 slices shown, 7 images]
[im 16/92  soft-tissue]
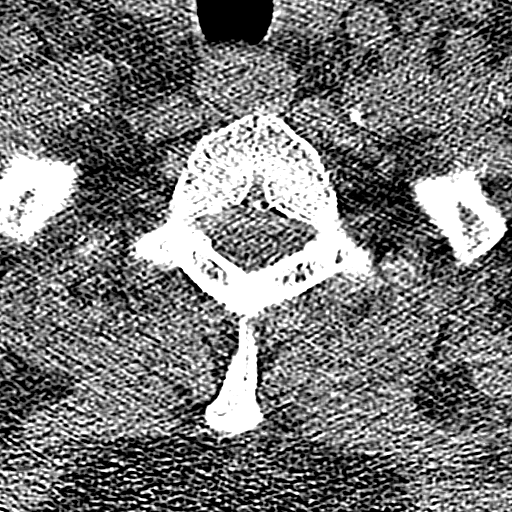
[im 16/92  bone]
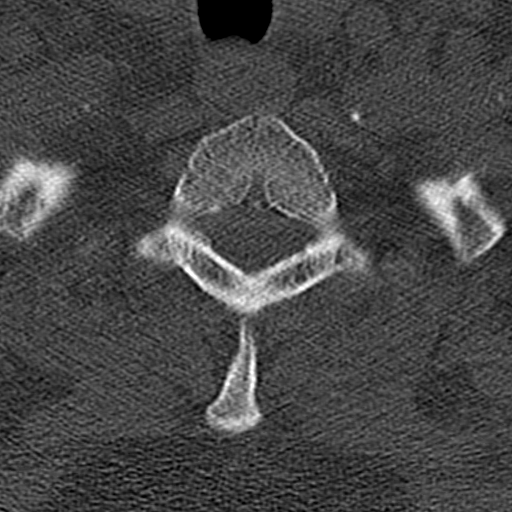
[im 31/92  bone]
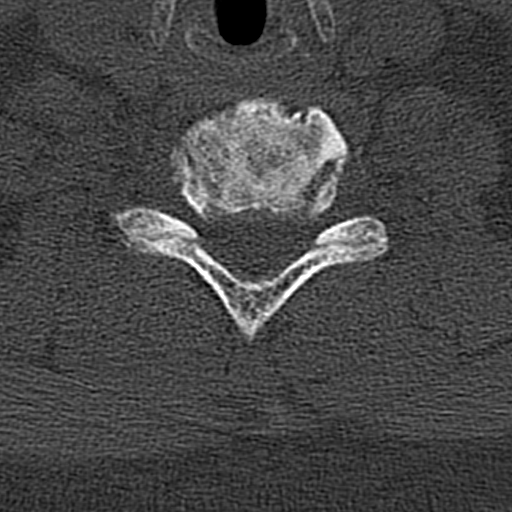
[im 46/92  bone]
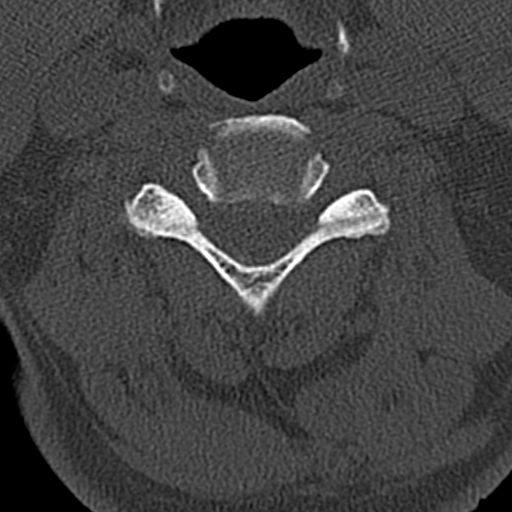
[im 61/92  bone]
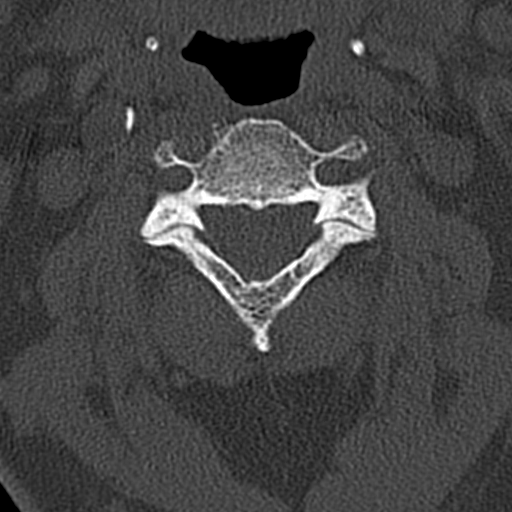
[im 76/92  soft-tissue]
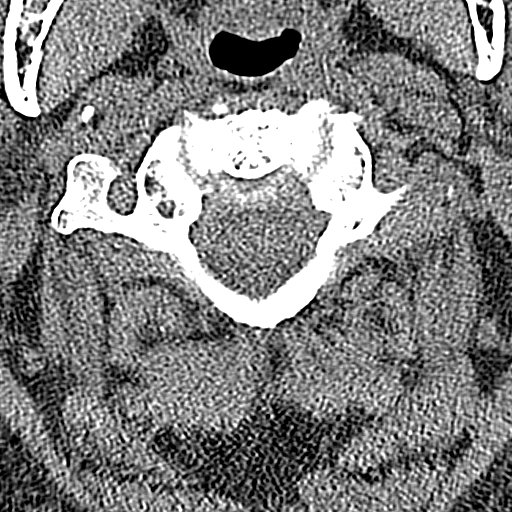
[im 76/92  bone]
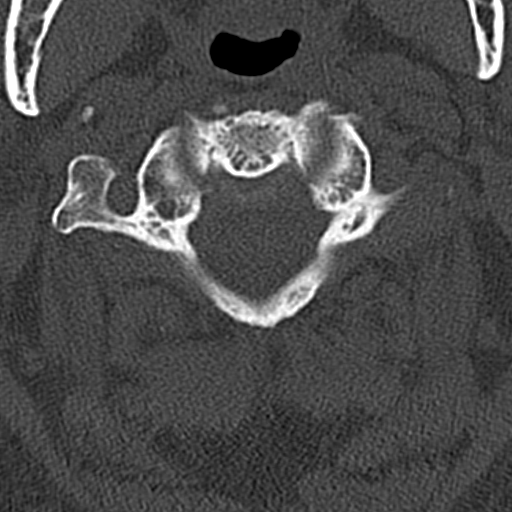

[14 of 33 positions shown; findings below may reference images not displayed]

FINDINGS: CT HEAD FINDINGS

Brain: No evidence of acute infarction, hemorrhage, hydrocephalus,
extra-axial collection or mass lesion/mass effect.

Vascular: No hyperdense vessel is noted. Calcifications in the left
middle cerebral artery distally are noted.

Skull: Normal. Negative for fracture or focal lesion.

Sinuses/Orbits: No acute finding.

Other: None.

CT CERVICAL SPINE FINDINGS

Alignment: Mild straightening of the normal cervical lordosis is
noted likely related to muscular spasm.

Skull base and vertebrae: 7 cervical segments are well visualized.
Vertebral body height is well maintained. Mild disc space narrowing
at C5-6 and C6-7 is noted with associated osteophytic change.

Soft tissues and spinal canal: Surrounding soft tissue structures
demonstrate a vague hypodense nodule measuring 16 mm in the right
lobe of thyroid. This was not well visualized on prior exam. No
other focal soft tissue abnormality is seen.

Upper chest: Visualized lung apices are within normal limits.

Other: None
IMPRESSION: CT of the head: No acute intracranial abnormality noted.

CT of cervical spine: Degenerative change as described.

Right thyroid nodule as described Recommend thyroid US (ref: [HOSPITAL]. [DATE]): 143-50).

## 2020-04-30 IMAGING — DX DG SHOULDER 2+V*L*
2 series · 2 of 2 positions shown · non-contrast
Comparison: None.

CLINICAL DATA: Fall, pain

EXAM:
LEFT SHOULDER - 2+ VIEW

[shoulder grashey]
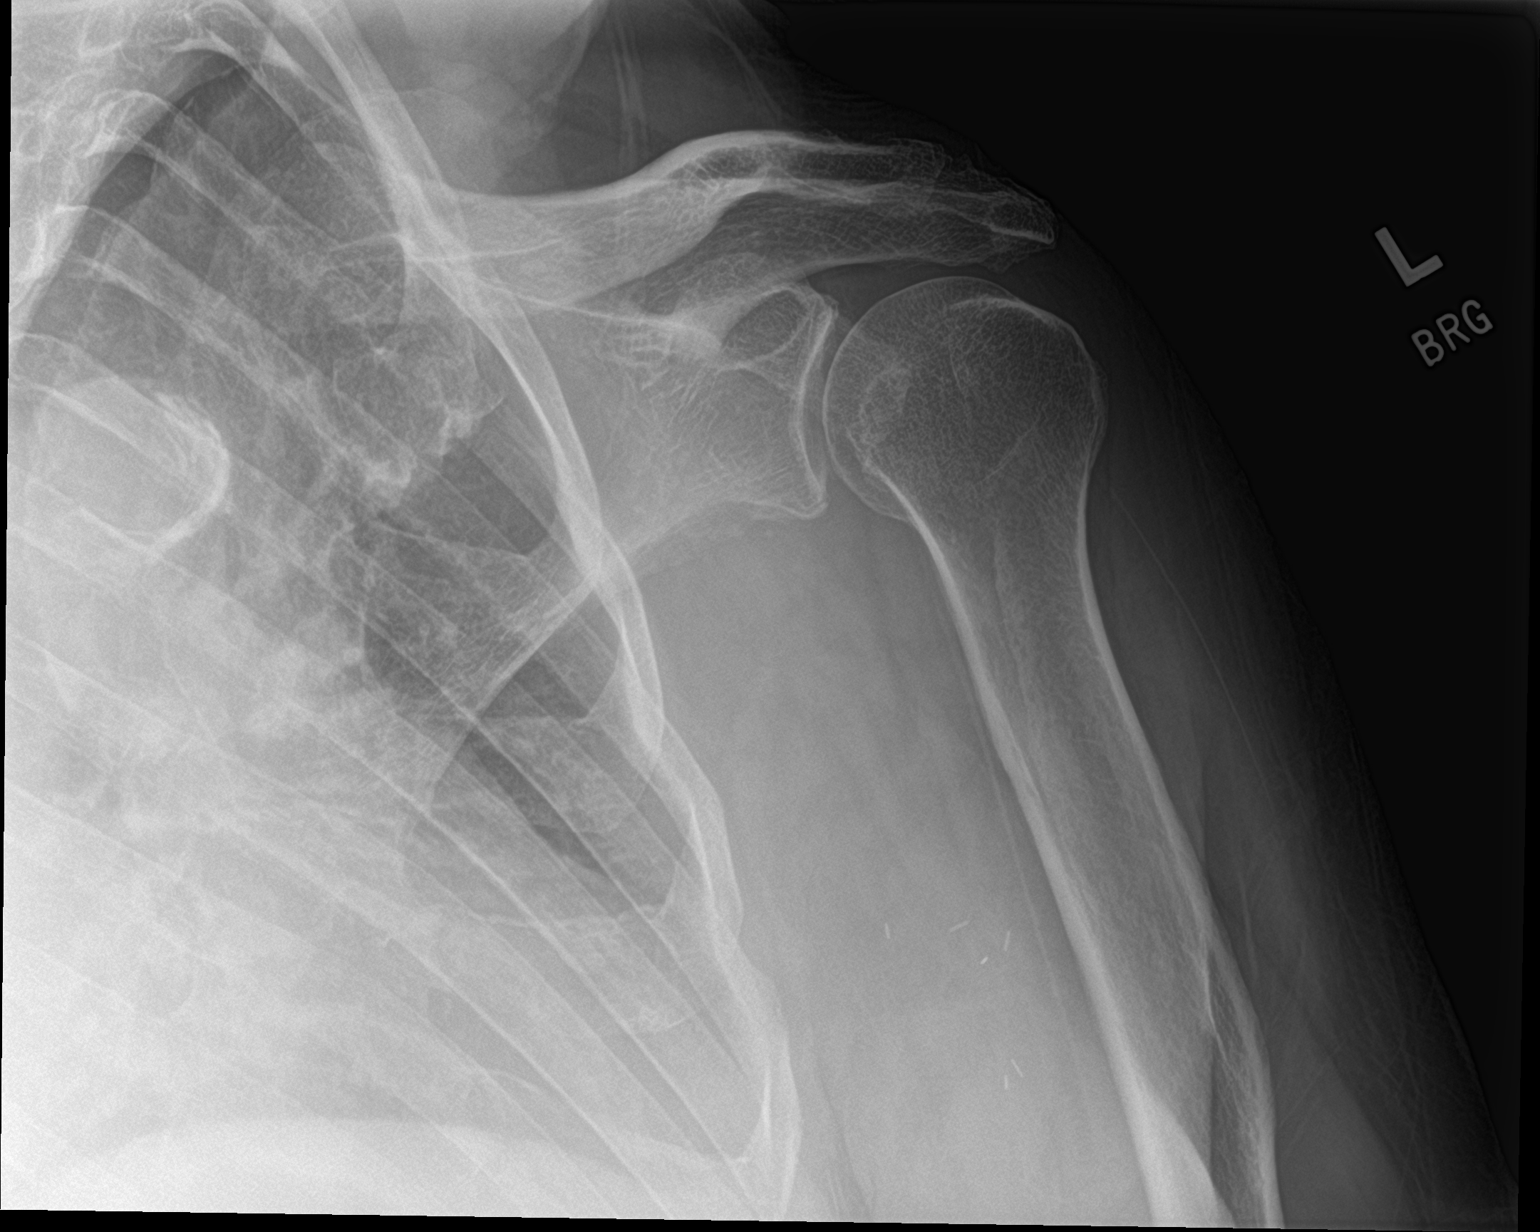

[shoulder y view]
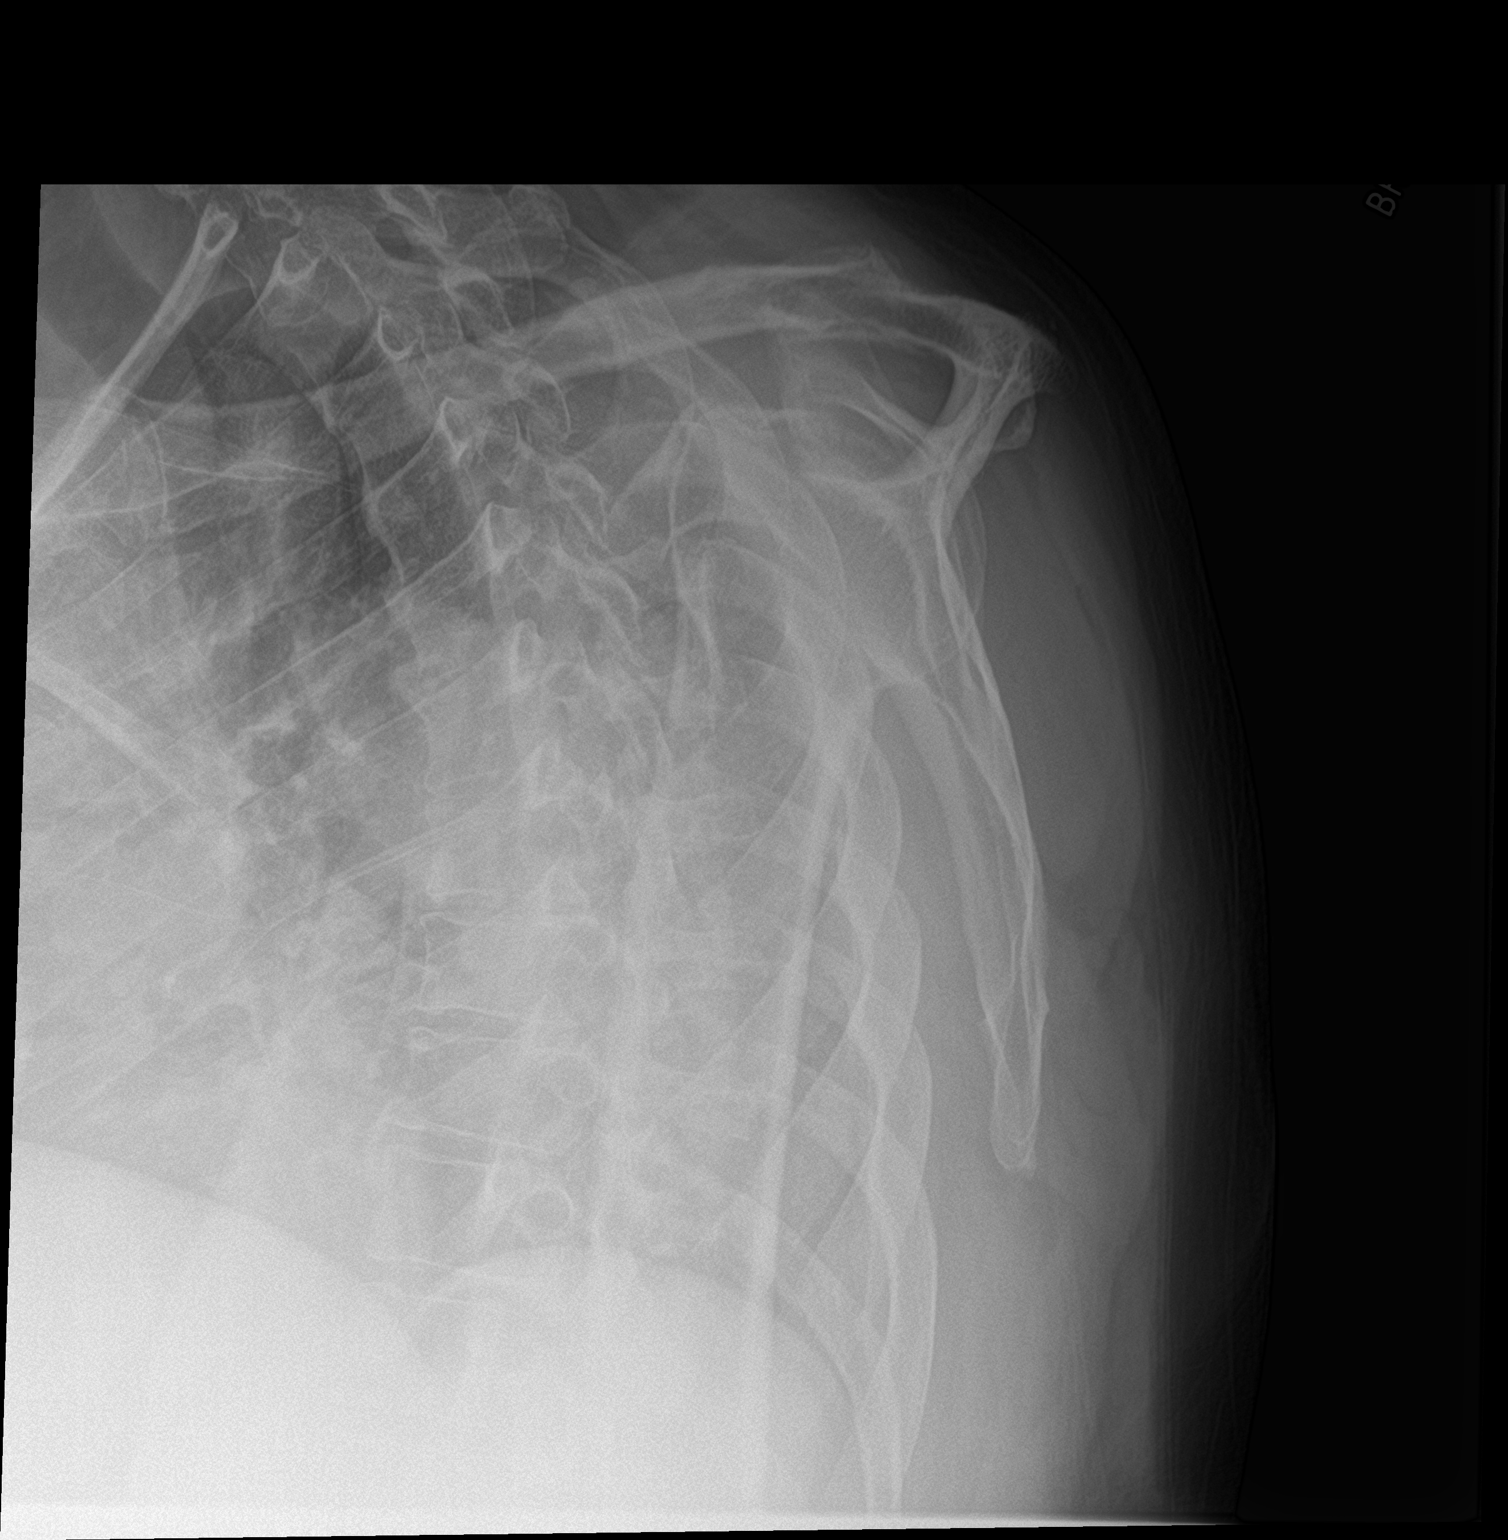

[2 of 2 positions shown; findings below may reference images not displayed]

FINDINGS: There is no evidence of fracture or dislocation. There is no
evidence of arthropathy or other focal bone abnormality. Soft
tissues are unremarkable.
IMPRESSION: No fracture or dislocation of the left shoulder. Joint spaces are
preserved.

## 2020-04-30 MED ORDER — HYDROCODONE-ACETAMINOPHEN 5-325 MG PO TABS: 1.0000 | ORAL_TABLET | ORAL | 0 refills | Status: DC | PRN

## 2020-04-30 MED ORDER — LIDOCAINE 5 % EX PTCH: 1.0000 | MEDICATED_PATCH | CUTANEOUS | 0 refills | Status: DC

## 2020-04-30 NOTE — Discharge Instructions (Signed)
Follow-up with orthopedics if your symptoms are unresolved  Return for new or worsening symptoms.

## 2020-04-30 NOTE — ED Provider Notes (Signed)
[facility] EMERGENCY DEPARTMENT Provider Note   CSN: [id number] Arrival date & time: 04/30/20  1248     History Fall   Trevor [name] is a 80 y.o. male with history significant for CKD, diabetes, hypertension, hyperlipidemia who presents for evaluation mechanical fall.  Occurred yesterday.  Slipped and fell on the ice.  Hit left side of head.  Since then has had left shoulder pain, some mild neck pain.  Has taken Tylenol once without relief.  Initially had headache however this resolved.  No vision changes.  He has no pain with movement to his neck.  States he has been trying to move his shoulder which has helped some however he still feels like he has pain to the anterior lateral aspect of his shoulder.  Does not extend into his distal humerus, left upper extremity distal aspect.  No paresthesias, weakness.  He denies chest pain, shortness of breath, abdominal pain, diarrhea, dysuria, pain to lower extremities.  Denies additional aggravating or alleviating factors.  History obtained from patient and past medical records.  No interpreter used.  HPI     Past Medical History:  Diagnosis Date  . Anemia   . Chronic kidney disease    Stage 4?   . Diabetes mellitus without complication ([place])   . Hepatitis    not sure what type - over 20 years ago  . History of kidney stones   . Hyperlipidemia   . Hypertension     Patient Active Problem List   Diagnosis Date Noted  . Special screening for malignant neoplasms, colon     Past Surgical History:  Procedure Laterality Date  . AV FISTULA PLACEMENT Left 08/09/2019   Procedure: LEFT ARM Basilic  ARTERIOVENOUS  FISTULA CREATION;  Surgeon: Waynetta Sandy, MD;  Location: [place];  Service: Vascular;  Laterality: Left;  . BASCILIC VEIN TRANSPOSITION Left 11/30/2019   Procedure: LEFT SECOND STAGE BASCILIC VEIN TRANSPOSITION;  Surgeon: Waynetta Sandy, MD;  Location: [place];  Service: Vascular;  Laterality: Left;  .  COLONOSCOPY N/A 07/07/2017   Procedure: COLONOSCOPY;  Surgeon: Danie Binder, MD;  Location[facility];  Service: Endoscopy;  Laterality: N/A;  8:30       Family History  Problem Relation Age of Onset  . Diabetes Mother   . Diabetes Brother   . Heart attack Brother   . Colon cancer Son   . Lung cancer Son     Social History   Tobacco Use  . Smoking status: Never Smoker  . Smokeless tobacco: Never Used  Vaping Use  . Vaping Use: Never used  Substance Use Topics  . Alcohol use: Never  . Drug use: Never    Home Medications Prior to Admission medications   Medication Sig Start Date End Date Taking? Authorizing Provider  HYDROcodone-acetaminophen (NORCO/VICODIN) 5-325 MG tablet Take 1 tablet by mouth every 4 (four) hours as needed. 04/30/20  Yes Kanden Carey A, PA-C  lidocaine (LIDODERM) 5 % Place 1 patch onto the skin daily. Remove & Discard patch within 12 hours or as directed by MD 04/30/20  Yes Andray Assefa A, PA-C  ACCU-CHEK AVIVA PLUS test strip  08/11/19   [provider]  Accu-Chek Softclix Lancets lancets  09/13/19   [provider]  Alcohol Swabs (B-D SINGLE USE SWABS REGULAR) PADS  08/11/19   [provider]  aspirin EC 81 MG tablet Take 81 mg by mouth daily.    [provider]  carvedilol (COREG)  3.125 MG tablet Take 6.25 mg by mouth 2 (two) times daily with a meal.  10/18/19 10/17/20  [provider]  Cholecalciferol 25 MCG (1000 UT) tablet Take 1,000 Units by mouth daily.  12/23/09   [provider]  cloNIDine (CATAPRES) 0.2 MG tablet Take 0.2 mg by mouth 3 (three) times daily.  04/12/17   [provider]  furosemide (LASIX) 40 MG tablet Take 40 mg by mouth 2 (two) times daily.  07/14/19 11/20/19  [provider]  Garlic 9379 MG CAPS Take 1,000 mg by mouth daily.    [provider]  OVER THE COUNTER MEDICATION Take 1 tablet by mouth daily. Kidney Cleanser     [provider]   rosuvastatin (CRESTOR) 10 MG tablet Take 10 mg by mouth daily.  01/26/17   [provider]  sodium bicarbonate 650 MG tablet Take 650 mg by mouth 3 (three) times daily.  07/24/19   [provider]  TRESIBA FLEXTOUCH 200 UNIT/ML SOPN Inject 5-20 Units into the skin at bedtime as needed (high blood sugar). Sliding scale 04/15/17   [provider]    Allergies    Patient has no known allergies.  Review of Systems   Review of Systems  Constitutional: Negative.   HENT: Negative.   Respiratory: Negative.   Cardiovascular: Negative.   Gastrointestinal: Negative.   Genitourinary: Negative.   Musculoskeletal: Positive for neck pain.       Left shoulder pain  Skin: Negative.   Neurological: Positive for headaches. Negative for dizziness, tremors, seizures, syncope, facial asymmetry, speech difficulty, weakness, light-headedness and numbness.  All other systems reviewed and are negative.   Physical Exam Updated Vital Signs BP (!) 193/70 (BP Location: Right Arm)   Pulse 68   Temp 98 F (36.7 C) (Oral)   Resp 20   Ht 5\' 11"  (1.803 m)   Wt 108.9 kg   SpO2 100%   BMI 33.47 kg/m   Physical Exam Vitals and nursing note reviewed.  Constitutional:      General: He is not in acute distress.    Appearance: He is well-developed and well-nourished. He is not ill-appearing, toxic-appearing or diaphoretic.  HENT:     Head: Normocephalic and atraumatic.     Nose: Nose normal.     Mouth/Throat:     Mouth: Mucous membranes are moist.  Eyes:     Pupils: Pupils are equal, round, and reactive to light.  Cardiovascular:     Rate and Rhythm: Normal rate and regular rhythm.     Pulses: Normal pulses.     Heart sounds: Normal heart sounds.  Pulmonary:     Effort: Pulmonary effort is normal. No respiratory distress.     Breath sounds: Normal breath sounds.  Abdominal:     General: Bowel sounds are normal. There is no distension.     Palpations: Abdomen is soft. There is  no mass.     Tenderness: There is no abdominal tenderness. There is no right CVA tenderness, left CVA tenderness, guarding or rebound.     Hernia: No hernia is present.  Musculoskeletal:        General: Normal range of motion.     Cervical back: Normal range of motion and neck supple.     Comments: Dialysis graft to left upper extremity. Tenderness to lateral, anterior left shoulder.  Pain worse with overhead movement.  No bony tenderness to midshaft, distal humerus, olecranon, radius or ulna.  Equal handgrip bilaterally.  Mild tenderness  to left paraspinal region on cervical.  No tenderness to thoracic or lumbar.  Pelvis stable, nontender to palpation.  No bony tenderness of bilateral lower extremities no shortening or rotation of legs.  Skin:    General: Skin is warm and dry.     Capillary Refill: Capillary refill takes less than 2 seconds.  Neurological:     General: No focal deficit present.     Mental Status: He is alert and oriented to person, place, and time.     Comments: Cranial nerves II through XII grossly intact Ambulatory out difficulty Intact sensation Equal strength bilaterally  Psychiatric:        Mood and Affect: Mood and affect normal.     ED Results / Procedures / Treatments   Labs (all labs ordered are listed, but only abnormal results are displayed) Labs Reviewed - No data to display  EKG None  Radiology CT Head Wo Contrast  Result Date: 04/30/2020 CLINICAL DATA:  Recent slip and fall with headaches and neck pain, initial encounter EXAM: CT HEAD WITHOUT CONTRAST CT CERVICAL SPINE WITHOUT CONTRAST TECHNIQUE: Multidetector CT imaging of the head and cervical spine was performed following the standard protocol without intravenous contrast. Multiplanar CT image reconstructions of the cervical spine were also generated. COMPARISON:  02/02/2007 FINDINGS: CT HEAD FINDINGS Brain: No evidence of acute infarction, hemorrhage, hydrocephalus, extra-axial collection or mass  lesion/mass effect. Vascular: No hyperdense vessel is noted. Calcifications in the left middle cerebral artery distally are noted. Skull: Normal. Negative for fracture or focal lesion. Sinuses/Orbits: No acute finding. Other: None. CT CERVICAL SPINE FINDINGS Alignment: Mild straightening of the normal cervical lordosis is noted likely related to muscular spasm. Skull base and vertebrae: 7 cervical segments are well visualized. Vertebral body height is well maintained. Mild disc space narrowing at C5-6 and C6-7 is noted with associated osteophytic change. Soft tissues and spinal canal: Surrounding soft tissue structures demonstrate a vague hypodense nodule measuring 16 mm in the right lobe of thyroid. This was not well visualized on prior exam. No other focal soft tissue abnormality is seen. Upper chest: Visualized lung apices are within normal limits. Other: None IMPRESSION: CT of the head: No acute intracranial abnormality noted. CT of cervical spine: Degenerative change as described. Right thyroid nodule as described Recommend thyroid US (ref: J Am Coll Radiol. 2015 Feb;12(2): 143-50). Electronically Signed   By: Inez Catalina M.D.   On: 04/30/2020 20:00   CT Cervical Spine Wo Contrast  Result Date: 04/30/2020 CLINICAL DATA:  Recent slip and fall with headaches and neck pain, initial encounter EXAM: CT HEAD WITHOUT CONTRAST CT CERVICAL SPINE WITHOUT CONTRAST TECHNIQUE: Multidetector CT imaging of the head and cervical spine was performed following the standard protocol without intravenous contrast. Multiplanar CT image reconstructions of the cervical spine were also generated. COMPARISON:  02/02/2007 FINDINGS: CT HEAD FINDINGS Brain: No evidence of acute infarction, hemorrhage, hydrocephalus, extra-axial collection or mass lesion/mass effect. Vascular: No hyperdense vessel is noted. Calcifications in the left middle cerebral artery distally are noted. Skull: Normal. Negative for fracture or focal lesion.  Sinuses/Orbits: No acute finding. Other: None. CT CERVICAL SPINE FINDINGS Alignment: Mild straightening of the normal cervical lordosis is noted likely related to muscular spasm. Skull base and vertebrae: 7 cervical segments are well visualized. Vertebral body height is well maintained. Mild disc space narrowing at C5-6 and C6-7 is noted with associated osteophytic change. Soft tissues and spinal canal: Surrounding soft tissue structures demonstrate a vague hypodense nodule measuring 16 mm  in the right lobe of thyroid. This was not well visualized on prior exam. No other focal soft tissue abnormality is seen. Upper chest: Visualized lung apices are within normal limits. Other: None IMPRESSION: CT of the head: No acute intracranial abnormality noted. CT of cervical spine: Degenerative change as described. Right thyroid nodule as described Recommend thyroid US (ref: J Am Coll Radiol. 2015 Feb;12(2): 143-50). Electronically Signed   By: Inez Catalina M.D.   On: 04/30/2020 20:00   DG Shoulder Left  Result Date: 04/30/2020 CLINICAL DATA:  Fall, pain EXAM: LEFT SHOULDER - 2+ VIEW COMPARISON:  None. FINDINGS: There is no evidence of fracture or dislocation. There is no evidence of arthropathy or other focal bone abnormality. Soft tissues are unremarkable. IMPRESSION: No fracture or dislocation of the left shoulder. Joint spaces are preserved. Electronically Signed   By: Eddie Candle M.D.   On: 04/30/2020 14:02    Procedures Procedures (including critical care time)  Medications Ordered in ED Medications - No data to display  ED Course  I have reviewed the triage vital signs and the nursing notes.  Pertinent labs & imaging results that were available during my care of the patient were reviewed by me and considered in my medical decision making (see chart for details).   Patient presents for evaluation mechanical fall which occurred yesterday.  Patient with headache, left paraspinal cervical neck pain, left  shoulder pain.  No preceding symptoms prior to fall.  He is not anticoagulated.  Does have dialysis graft left upper extremity however he is not currently a dialysis patient states "my kidney function improved."  He has a nonfocal neuro exam without deficits.  Does have some bony tenderness to his left anterior and lateral shoulder with pain to overhead movement.  Plan on imaging and reassess  CT head without acute changes CT cervical without acute changes. Incidental thyroid nodule DG left shoulder without acute fracture, dislocation or effusion  Patient reassessed.  Discussed imaging.  Need to follow-up with PCP for incidental thyroid nodule.  I do question whether he has a rotator cuff tear.  He does not want sling at this time.  Discussed follow-up with orthopedics.  He is agreeable for this.  The patient has been appropriately medically screened and/or stabilized in the ED. I have low suspicion for any other emergent medical condition which would require further screening, evaluation or treatment in the ED or require inpatient management.  Patient is hemodynamically stable and in no acute distress.  Patient able to ambulate in department prior to ED.  Evaluation does not show acute pathology that would require ongoing or additional emergent interventions while in the emergency department or further inpatient treatment.  I have discussed the diagnosis with the patient and answered all questions.  Pain is been managed while in the emergency department and patient has no further complaints prior to discharge.  Patient is comfortable with plan discussed in room and is stable for discharge at this time.  I have discussed strict return precautions for returning to the emergency department.  Patient was encouraged to follow-up with PCP/specialist refer to at discharge.    MDM Rules/Calculators/A&P                           Final Clinical Impression(s) / ED Diagnoses Final diagnoses:  Fall, initial  encounter  Acute pain of left shoulder  Acute post-traumatic headache, not intractable    Rx / DC Orders ED  Discharge Orders         Ordered    lidocaine (LIDODERM) 5 %  Every 24 hours        04/30/20 2016    HYDROcodone-acetaminophen (NORCO/VICODIN) 5-325 MG tablet  Every 4 hours PRN        04/30/20 2017           Kyree Fedorko A, PA-C 04/30/20 2019    Varney Biles, MD 05/01/20 1758

## 2020-04-30 NOTE — ED Triage Notes (Signed)
Pt slipped on ice last night and fell hitting head on ice and c/o left shoulder pain.  Denies any blood thinners or LOC.

## 2020-05-01 DIAGNOSIS — E875 Hyperkalemia: Secondary | ICD-10-CM | POA: Diagnosis not present

## 2020-05-01 DIAGNOSIS — E041 Nontoxic single thyroid nodule: Secondary | ICD-10-CM | POA: Diagnosis not present

## 2020-05-01 DIAGNOSIS — D631 Anemia in chronic kidney disease: Secondary | ICD-10-CM | POA: Diagnosis not present

## 2020-05-01 DIAGNOSIS — E1122 Type 2 diabetes mellitus with diabetic chronic kidney disease: Secondary | ICD-10-CM | POA: Diagnosis not present

## 2020-05-01 DIAGNOSIS — N189 Chronic kidney disease, unspecified: Secondary | ICD-10-CM | POA: Diagnosis not present

## 2020-05-01 DIAGNOSIS — E211 Secondary hyperparathyroidism, not elsewhere classified: Secondary | ICD-10-CM | POA: Diagnosis not present

## 2020-05-01 DIAGNOSIS — R809 Proteinuria, unspecified: Secondary | ICD-10-CM | POA: Diagnosis not present

## 2020-05-01 DIAGNOSIS — I129 Hypertensive chronic kidney disease with stage 1 through stage 4 chronic kidney disease, or unspecified chronic kidney disease: Secondary | ICD-10-CM | POA: Diagnosis not present

## 2020-05-01 DIAGNOSIS — N185 Chronic kidney disease, stage 5: Secondary | ICD-10-CM | POA: Diagnosis not present

## 2020-05-01 MED FILL — Hydrocodone-Acetaminophen Tab 5-325 MG: ORAL | Qty: 6 | Status: AC

## 2020-05-02 ENCOUNTER — Other Ambulatory Visit: Payer: Self-pay

## 2020-05-02 ENCOUNTER — Ambulatory Visit: Payer: Medicare HMO | Admitting: "Endocrinology

## 2020-05-02 ENCOUNTER — Encounter: Payer: Self-pay | Admitting: "Endocrinology

## 2020-05-02 VITALS — BP 160/70 | HR 60 | Ht 70.0 in | Wt 236.2 lb

## 2020-05-02 DIAGNOSIS — E041 Nontoxic single thyroid nodule: Secondary | ICD-10-CM | POA: Diagnosis not present

## 2020-05-02 DIAGNOSIS — N185 Chronic kidney disease, stage 5: Secondary | ICD-10-CM | POA: Diagnosis not present

## 2020-05-02 DIAGNOSIS — E1122 Type 2 diabetes mellitus with diabetic chronic kidney disease: Secondary | ICD-10-CM | POA: Diagnosis not present

## 2020-05-02 LAB — HEMOGLOBIN A1C: Hemoglobin A1C: 6.2

## 2020-05-02 NOTE — Progress Notes (Signed)
Endocrinology Consult Note       05/03/2020, 8:43 AM   Subjective:    Patient ID: Trevor [name], male    DOB: 02-Jul-1940.  Trevor [name] is being seen in consultation for management of currently uncontrolled symptomatic diabetes requested by  Celene Squibb, MD.   Past Medical History:  Diagnosis Date  . Anemia   . Chronic kidney disease    Stage 4?   . Diabetes mellitus without complication ([place])   . Hepatitis    not sure what type - over 20 years ago  . History of kidney stones   . Hyperlipidemia   . Hypertension     Past Surgical History:  Procedure Laterality Date  . AV FISTULA PLACEMENT Left 08/09/2019   Procedure: LEFT ARM Basilic  ARTERIOVENOUS  FISTULA CREATION;  Surgeon: Waynetta Sandy, MD;  Location: [facility];  Service: Vascular;  Laterality: Left;  . BASCILIC VEIN TRANSPOSITION Left 11/30/2019   Procedure: LEFT SECOND STAGE BASCILIC VEIN TRANSPOSITION;  Surgeon: Waynetta Sandy, MD;  Location: [facility];  Service: Vascular;  Laterality: Left;  . COLONOSCOPY N/A 07/07/2017   Procedure: COLONOSCOPY;  Surgeon: Danie Binder, MD;  [facility];  Service: Endoscopy;  Laterality: N/A;  8:30    Social History   Socioeconomic History  . Marital status: Married    Spouse name: Not on file  . Number of children: Not on file  . Years of education: Not on file  . Highest education level: Not on file  Occupational History  . Not on file  Tobacco Use  . Smoking status: Never Smoker  . Smokeless tobacco: Never Used  Vaping Use  . Vaping Use: Never used  Substance and Sexual Activity  . Alcohol use: Never  . Drug use: Never  . Sexual activity: Not on file  Other Topics Concern  . Not on file  Social History Narrative  . Not on file   Social Determinants of Health   Financial Resource Strain: Not on file  Food Insecurity: Not on file  Transportation  Needs: Not on file  Physical Activity: Not on file  Stress: Not on file  Social Connections: Not on file    Family History  Problem Relation Age of Onset  . Diabetes Mother   . Diabetes Brother   . Heart attack Brother   . Colon cancer Son   . Lung cancer Son     Outpatient Encounter Medications as of 05/02/2020  Medication Sig  . ACCU-CHEK AVIVA PLUS test strip   . Accu-Chek Softclix Lancets lancets   . Alcohol Swabs (B-D SINGLE USE SWABS REGULAR) PADS   . aspirin EC 81 MG tablet Take 81 mg by mouth daily.  . carvedilol (COREG) 3.125 MG tablet Take 6.25 mg by mouth 2 (two) times daily with a meal.   . Cholecalciferol 25 MCG (1000 UT) tablet Take 1,000 Units by mouth daily.   . cloNIDine (CATAPRES) 0.2 MG tablet Take 0.2 mg by mouth 3 (three) times daily.   . furosemide (LASIX) 40 MG tablet Take 40 mg by mouth 2 (two) times daily as  needed.  . Garlic 3664 MG CAPS Take 1,000 mg by mouth daily. (Patient not taking: Reported on 05/02/2020)  . HYDROcodone-acetaminophen (NORCO/VICODIN) 5-325 MG tablet Take 1 tablet by mouth every 4 (four) hours as needed. (Patient not taking: Reported on 05/02/2020)  . lidocaine (LIDODERM) 5 % Place 1 patch onto the skin daily. Remove & Discard patch within 12 hours or as directed by MD (Patient not taking: Reported on 05/02/2020)  . OVER THE COUNTER MEDICATION Take 1 tablet by mouth daily. Kidney Cleanser   . rosuvastatin (CRESTOR) 10 MG tablet Take 10 mg by mouth daily.   . sodium bicarbonate 650 MG tablet Take 650 mg by mouth 3 (three) times daily.   . TRESIBA FLEXTOUCH 200 UNIT/ML SOPN Inject 5-20 Units into the skin at bedtime as needed (high blood sugar). Sliding scale   No facility-administered encounter medications on file as of 05/02/2020.    ALLERGIES: No Known Allergies  VACCINATION STATUS: Immunization History  Administered Date(s) Administered  . Influenza-Unspecified 02/11/2018    Diabetes He presents for his initial diabetic visit.  He has type 2 diabetes mellitus. Onset time: He was diagnosed at approximate age of 54 years. His disease course has been improving. There are no hypoglycemic associated symptoms. Pertinent negatives for hypoglycemia include no confusion, headaches, pallor or seizures. There are no diabetic associated symptoms. Pertinent negatives for diabetes include no chest pain, no fatigue, no polydipsia, no polyphagia, no polyuria and no weakness. There are no hypoglycemic complications. Symptoms are stable. Diabetic complications include nephropathy. Risk factors for coronary artery disease include dyslipidemia, hypertension, male sex, obesity, sedentary lifestyle and diabetes mellitus. Current diabetic treatment includes insulin injections (He is using [place] 10 to 20 units nightly.). His weight is fluctuating minimally. He is following a generally unhealthy diet. When asked about meal planning, he reported none. He has not had a previous visit with a dietitian. He rarely participates in exercise. (He did not bring logs nor meter with him to review.  His A1c was 6.4% at his nephrology visit on February 15, 2020.) Eye exam is current.  Hyperlipidemia This is a chronic problem. The current episode started more than 1 year ago. The problem is controlled. Exacerbating diseases include chronic renal disease and diabetes. Pertinent negatives include no chest pain, myalgias or shortness of breath. Current antihyperlipidemic treatment includes statins. Risk factors for coronary artery disease include diabetes mellitus, dyslipidemia, male sex, obesity, hypertension and a sedentary lifestyle.  Hypertension This is a chronic problem. The current episode started more than 1 year ago. The problem is uncontrolled. Pertinent negatives include no chest pain, headaches, neck pain, palpitations or shortness of breath. Risk factors for coronary artery disease include diabetes mellitus, dyslipidemia, male gender, obesity and sedentary  lifestyle. Past treatments include central alpha agonists. Hypertensive end-organ damage includes kidney disease. Identifiable causes of hypertension include chronic renal disease and a thyroid problem.  Thyroid Problem Presents for initial visit. Onset time: Patient was found to have 3mm right lobe nodule on CT scan. Patient reports no constipation, diarrhea, fatigue or palpitations. Past treatments include nothing. The following procedures have not been performed: thyroid FNA, thyroid ultrasound and thyroidectomy. His past medical history is significant for diabetes, hyperlipidemia and obesity.     Review of Systems  Constitutional: Negative for chills, fatigue, fever and unexpected weight change.  HENT: Negative for dental problem, mouth sores and trouble swallowing.   Eyes: Negative for visual disturbance.  Respiratory: Negative for cough, choking, chest tightness, shortness of breath and wheezing.  Cardiovascular: Negative for chest pain, palpitations and leg swelling.  Gastrointestinal: Negative for abdominal distention, abdominal pain, constipation, diarrhea, nausea and vomiting.  Endocrine: Negative for polydipsia, polyphagia and polyuria.  Genitourinary: Negative for dysuria, flank pain, hematuria and urgency.  Musculoskeletal: Negative for back pain, gait problem, myalgias and neck pain.  Skin: Negative for pallor, rash and wound.  Neurological: Negative for seizures, syncope, weakness, numbness and headaches.  Psychiatric/Behavioral: Negative for confusion and dysphoric mood.    Objective:    Vitals with BMI 05/02/2020 04/30/2020 04/30/2020  Height 5\' 10"  - 5\' 11"   Weight 236 lbs 3 oz - 240 lbs  BMI 49.70 - 26.37  Systolic 858 850 277  Diastolic 70 69 70  Pulse 60 67 68    BP (!) 160/70   Pulse 60   Ht 5\' 10"  (1.778 m)   Wt 236 lb 3.2 oz (107.1 kg)   BMI 33.89 kg/m   Wt Readings from Last 3 Encounters:  05/02/20 236 lb 3.2 oz (107.1 kg)  04/30/20 240 lb (108.9 kg)   11/30/19 234 lb (106.1 kg)     Physical Exam Constitutional:      General: He is not in acute distress.    Appearance: He is well-developed and well-nourished.  HENT:     Head: Normocephalic and atraumatic.  Eyes:     Extraocular Movements: EOM normal.  Neck:     Thyroid: No thyromegaly.     Trachea: No tracheal deviation.     Comments: Thyroid is palpable, not grossly enlarged. Cardiovascular:     Rate and Rhythm: Normal rate.     Pulses:          Dorsalis pedis pulses are 1+ on the right side and 1+ on the left side.       Posterior tibial pulses are 1+ on the right side and 1+ on the left side.     Heart sounds: S1 normal and S2 normal. No murmur heard. No gallop.   Pulmonary:     Effort: Pulmonary effort is normal. No respiratory distress.     Breath sounds: No wheezing.  Abdominal:     General: There is no distension.     Tenderness: There is no abdominal tenderness. There is no CVA tenderness or guarding.  Musculoskeletal:        General: Swelling present.     Right shoulder: No swelling or deformity.     Cervical back: Normal range of motion and neck supple.     Right lower leg: Edema present.     Left lower leg: Edema present.  Skin:    General: Skin is warm and dry.     Findings: No rash.     Nails: There is no clubbing or cyanosis.  Neurological:     Mental Status: He is alert and oriented to person, place, and time.     Cranial Nerves: No cranial nerve deficit.     Sensory: No sensory deficit.     Gait: Gait normal.     Deep Tendon Reflexes: Strength normal and reflexes are normal and symmetric.  Psychiatric:        Mood and Affect: Mood and affect normal.        Speech: Speech normal.        Behavior: Behavior normal. Behavior is cooperative.        Thought Content: Thought content normal.        Cognition and Memory: Cognition and memory normal.  Judgment: Judgment normal.       CMP ( most recent) CMP     Component Value Date/Time   NA  141 11/30/2019 0619   K 3.9 11/30/2019 0619   CL 104 11/30/2019 0619   GLUCOSE 120 (H) 11/30/2019 0619   BUN 60 (A) 04/30/2020 0000   CREATININE 5.0 (A) 04/30/2020 0000   CREATININE 4.00 (H) 11/30/2019 0619   GFRNONAA 10 04/30/2020 0000   GFRAA 12 04/30/2020 0000     Diabetic Labs (most recent): Lab Results  Component Value Date   HGBA1C 6.2 05/02/2020    Recent Results (from the past 2160 hour(s))  Basic metabolic panel     Status: Abnormal   Collection Time: 04/30/20 12:00 AM  Result Value Ref Range   BUN 60 (A) 4 - 21   Creatinine 5.0 (A) 0.6 - 1.3  Comprehensive metabolic panel     Status: None   Collection Time: 04/30/20 12:00 AM  Result Value Ref Range   GFR calc Af Amer 12    GFR calc non Af Amer 10   Hemoglobin A1c     Status: None   Collection Time: 05/02/20 12:00 AM  Result Value Ref Range   Hemoglobin A1C 6.2       Assessment & Plan:   1. Diabetes mellitus with stage 5 chronic kidney disease (HCC)  - Trevor [name] has currently uncontrolled symptomatic type 2 DM since  80 years of age,  with most recent A1c of 6.2 %. Recent labs reviewed.  He shares an meter with his wife, did not bring logs nor meter with him to review.  His A1c was 6.4% at his nephrology visit on February 15, 2020. - I had a long discussion with him about the progressive nature of diabetes and the pathology behind its complications. -his diabetes is complicated by obesity/sedentary life, end-stage renal disease and he remains at a high risk for more acute and chronic complications which include CAD, CVA, CKD, retinopathy, and neuropathy. These are all discussed in detail with him.  - I have counseled him on diet  and weight management  by adopting a carbohydrate restricted/protein rich diet. Patient is encouraged to switch to  unprocessed or minimally processed     complex starch and increased protein intake (animal or plant source), fruits, and vegetables. -  he is advised to stick to a  routine mealtimes to eat 3 meals  a day and avoid unnecessary snacks ( to snack only to correct hypoglycemia).   - he acknowledges that there is a room for improvement in his food and drink choices. - Suggestion is made for him to avoid simple carbohydrates  from his diet including Cakes, Sweet Desserts, Ice Cream, Soda (diet and regular), Sweet Tea, Candies, Chips, Cookies, Store Bought Juices, Alcohol in Excess of  1-2 drinks a day, Artificial Sweeteners,  Coffee Creamer, and "Sugar-free" Products. This will help patient to have more stable blood glucose profile and potentially avoid unintended weight gain.  - he will be scheduled with Jearld Fenton, RDN, CDE for diabetes education.  - I have approached him with the following individualized plan to manage  his diabetes and patient agrees:   -In light of his recent A1c of 6.2%, he will not need prandial insulin for now.  Given the fact that he does not have a safe oral medication options for diabetes, he is advised to continue [place] 10 units nightly associated with monitoring of blood glucose at least twice a  day-daily before breakfast and at bedtime. -I prescribed a meter and supplies for him.  - he is warned not to take insulin without proper monitoring per orders.  - he is encouraged to call clinic for blood glucose levels less than 70 or above 200 mg /dl at fasting.   - he is not a candidate for metformin, SGLT2 inhibitors due to concurrent renal insufficiency.  - he will be considered for incretin therapy as appropriate next visit.  - Specific targets for  A1c;  LDL, HDL,  and Triglycerides were discussed with the patient.  2) Blood Pressure /Hypertension:  his blood pressure is not controlled to target.   he is advised to continue his current medications including clonidine 0.2 mg 3 times a day, Coreg 6.25  mg p.o. twice daily , advised to maintain close follow-up with his nephrology.    3) Lipids/Hyperlipidemia:   Review of his  recent lipid panel showed  controlled  LDL at 73 .  he  is advised to continue    Crestor 10 mg daily at bedtime.  Side effects and precautions discussed with him.  4)  Weight/Diet:  Body mass index is 33.89 kg/m.  -   clearly complicating his diabetes care.   he is  a candidate for weight loss. I discussed with him the fact that loss of 5 - 10% of his  current body weight will have the most impact on his diabetes management.  Exercise, and detailed carbohydrates information provided  -  detailed on discharge instructions.   5) thyroid nodule: Incidental finding of 53mm nodule in the right lobe of his thyroid.  He denies any prior history of thyroid dysfunction.  He will be sent for thyroid function test and thyroid ultrasound for better characterization.  6) Chronic Care/Health Maintenance:  -he  is on  Statin medications and  is encouraged to initiate and continue to follow up with Ophthalmology, Dentist,  Podiatrist at least yearly or according to recommendations, and advised to   stay away from smoking. I have recommended yearly flu vaccine and pneumonia vaccine at least every 5 years; moderate intensity exercise for up to 150 minutes weekly; and  sleep for at least 7 hours a day.  - he is  advised to maintain close follow up with Celene Squibb, MD for primary care needs, as well as his other providers for optimal and coordinated care.   - Time spent in this patient care: 60 min, of which > 50% was spent in  counseling  him about his controlled, complicated type 2 diabetes, thyroid nodule and the rest reviewing his blood glucose logs , discussing his hypoglycemia and hyperglycemia episodes, reviewing his current and  previous labs / studies  ( including abstraction from other facilities) and medications  doses and developing a  long term treatment plan based on the latest standards of care/ guidelines; and documenting his care.    Please refer to Patient Instructions for Blood Glucose Monitoring  and Insulin/Medications Dosing Guide"  in media tab for additional information. Please  also refer to " Patient Self Inventory" in the Media  tab for reviewed elements of pertinent patient history.  Kathrynn Running Villard participated in the discussions, expressed understanding, and voiced agreement with the above plans.  All questions were answered to his satisfaction. he is encouraged to contact clinic should he have any questions or concerns prior to his return visit.   Follow up plan: - Return in about 2 weeks (around 05/16/2020)  for Labs Today- Non-Fasting Ok, Thyroid / Neck Ultrasound.  Glade Lloyd, MD [facility] [address] [place],  [address] Phone: [phone]  Fax: [phone]    05/03/2020, 8:43 AM  This note was partially dictated with voice recognition software. Similar sounding words can be transcribed inadequately or may not  be corrected upon review.

## 2020-05-02 NOTE — Patient Instructions (Signed)
                                     Advice for Weight Management  -For most of us the best way to lose weight is by diet management. Generally speaking, diet management means consuming less calories intentionally which over time brings about progressive weight loss.  This can be achieved more effectively by restricting carbohydrate consumption to the minimum possible.  So, it is critically important to know your numbers: how much calorie you are consuming and how much calorie you need. More importantly, our carbohydrates sources should be unprocessed or minimally processed complex starch food items.   Sometimes, it is important to balance nutrition by increasing protein intake (animal or plant source), fruits, and vegetables.  -Sticking to a routine mealtime to eat 3 meals a day and avoiding unnecessary snacks is shown to have a big role in weight control. Under normal circumstances, the only time we lose real weight is when we are hungry, so allow hunger to take place- hunger means no food between meal times, only water.  It is not advisable to starve.   -It is better to avoid simple carbohydrates including: Cakes, Sweet Desserts, Ice Cream, Soda (diet and regular), Sweet Tea, Candies, Chips, Cookies, Store Bought Juices, Alcohol in Excess of  1-2 drinks a day, Lemonade,  Artificial Sweeteners, Doughnuts, Coffee Creamers, "Sugar-free" Products, etc, etc.  This is not a complete list.....    -Consulting with certified diabetes educators is proven to provide you with the most accurate and current information on diet.  Also, you may be  interested in discussing diet options/exchanges , we can schedule a visit with Trevor [name], RDN, CDE for individualized nutrition education.  -Exercise: If you are able: 30 -60 minutes a day ,4 days a week, or 150 minutes a week.  The longer the better.  Combine stretch, strength, and aerobic activities.  If you were told in the  past that you have high risk for cardiovascular diseases, you may seek evaluation by your heart doctor prior to initiating moderate to intense exercise programs.                                  Additional Care Considerations for Diabetes   -Diabetes  is a chronic disease.  The most important care consideration is regular follow-up with your diabetes care provider with the goal being avoiding or delaying its complications and to take advantage of advances in medications and technology.    -Type 2 diabetes is known to coexist with other important comorbidities such as high blood pressure and high cholesterol.  It is critical to control not only the diabetes but also the high blood pressure and high cholesterol to minimize and delay the risk of complications including coronary artery disease, stroke, amputations, blindness, etc.    - Studies showed that people with diabetes will benefit from a class of medications known as ACE inhibitors and statins.  Unless there are specific reasons not to be on these medications, the standard of care is to consider getting one from these groups of medications at an optimal doses.  These medications are generally considered safe and proven to help protect the heart and the kidneys.    - People with diabetes are encouraged to initiate and maintain regular follow-up with eye doctors, foot   doctors, dentists , and if necessary heart and kidney doctors.     - It is highly recommended that people with diabetes quit smoking or stay away from smoking, and get yearly  flu vaccine and pneumonia vaccine at least every 5 years.  One other important lifestyle recommendation is to ensure adequate sleep - at least 6-7 hours of uninterrupted sleep at night.  -Exercise: If you are able: 30 -60 minutes a day, 4 days a week, or 150 minutes a week.  The longer the better.  Combine stretch, strength, and aerobic activities.  If you were told in the past that you have high risk for  cardiovascular diseases, you may seek evaluation by your heart doctor prior to initiating moderate to intense exercise programs.          

## 2020-05-03 DIAGNOSIS — E041 Nontoxic single thyroid nodule: Secondary | ICD-10-CM | POA: Diagnosis not present

## 2020-05-03 DIAGNOSIS — N185 Chronic kidney disease, stage 5: Secondary | ICD-10-CM | POA: Diagnosis not present

## 2020-05-03 DIAGNOSIS — E1122 Type 2 diabetes mellitus with diabetic chronic kidney disease: Secondary | ICD-10-CM | POA: Diagnosis not present

## 2020-05-03 MED ORDER — ONETOUCH VERIO W/DEVICE KIT: 1.0000 | PACK | 0 refills | Status: DC | PRN

## 2020-05-03 MED ORDER — ONETOUCH VERIO VI STRP: ORAL_STRIP | 2 refills | Status: DC

## 2020-05-04 LAB — T3, FREE: T3, Free: 2.6 pg/mL (ref 2.0–4.4)

## 2020-05-04 LAB — TSH: TSH: 3.74 u[IU]/mL (ref 0.450–4.500)

## 2020-05-04 LAB — T4, FREE: Free T4: 1.32 ng/dL (ref 0.82–1.77)

## 2020-05-09 ENCOUNTER — Ambulatory Visit (HOSPITAL_COMMUNITY)
Admission: RE | Admit: 2020-05-09 | Discharge: 2020-05-09 | Disposition: A | Discharge status: Home / Self Care | Payer: Medicare HMO | Source: Ambulatory Visit | Attending: "Endocrinology | Admitting: "Endocrinology

## 2020-05-09 ENCOUNTER — Other Ambulatory Visit: Payer: Self-pay

## 2020-05-09 DIAGNOSIS — N185 Chronic kidney disease, stage 5: Secondary | ICD-10-CM | POA: Insufficient documentation

## 2020-05-09 DIAGNOSIS — E1122 Type 2 diabetes mellitus with diabetic chronic kidney disease: Secondary | ICD-10-CM | POA: Diagnosis not present

## 2020-05-09 DIAGNOSIS — E041 Nontoxic single thyroid nodule: Secondary | ICD-10-CM | POA: Diagnosis not present

## 2020-05-09 IMAGING — US US THYROID
1 series · 13 of 25 positions shown · non-contrast
Comparison: None.

CLINICAL DATA: Incidental on CT.

EXAM:
THYROID ULTRASOUND
TECHNIQUE: Ultrasound examination of the thyroid gland and adjacent soft
tissues was performed.

[Series 1: us thyroid · 13 of 42 slices shown]
[im 1/42]
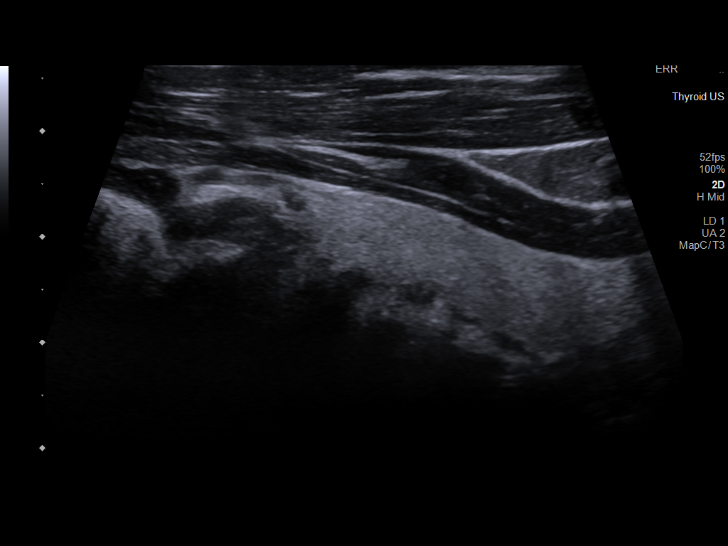
[im 4/42]
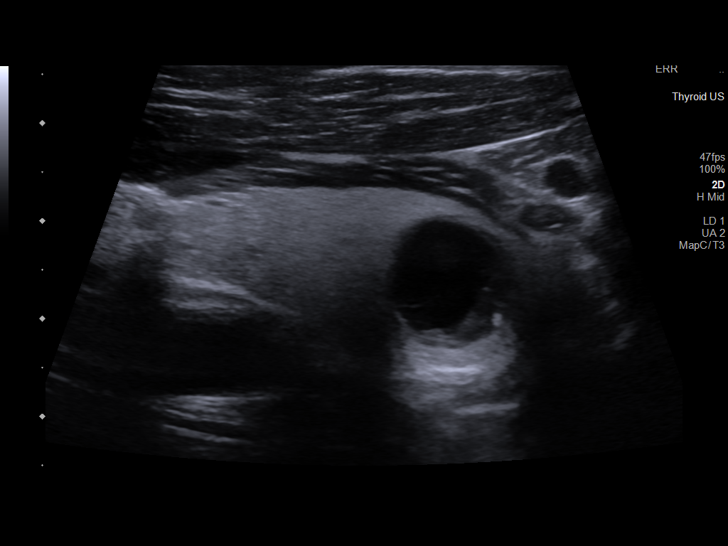
[im 7/42]
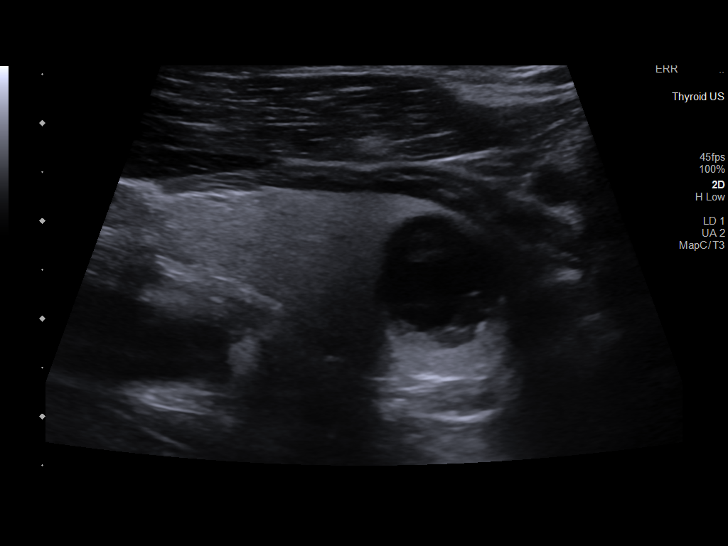
[im 11/42]
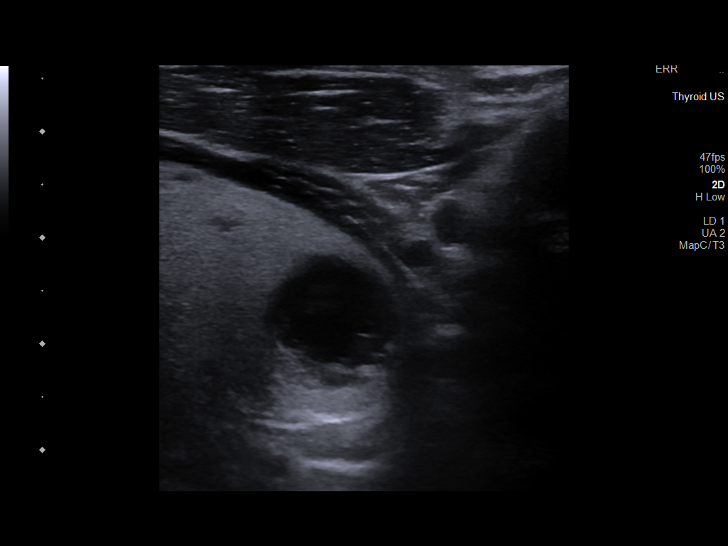
[im 14/42]
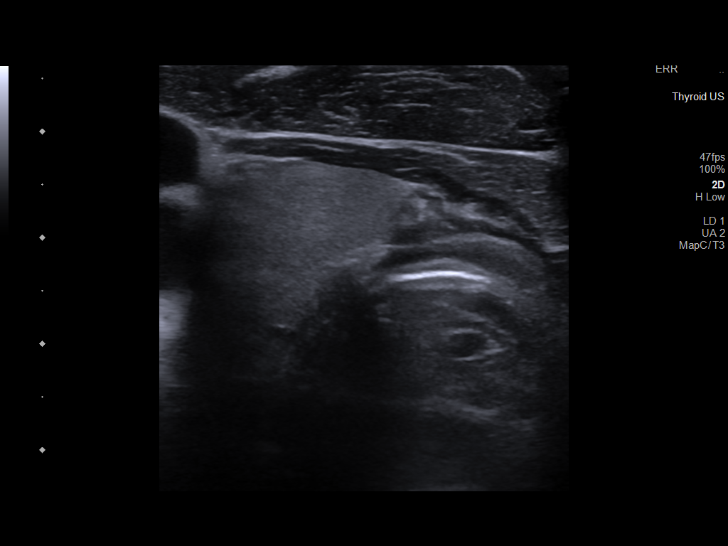
[im 18/42]
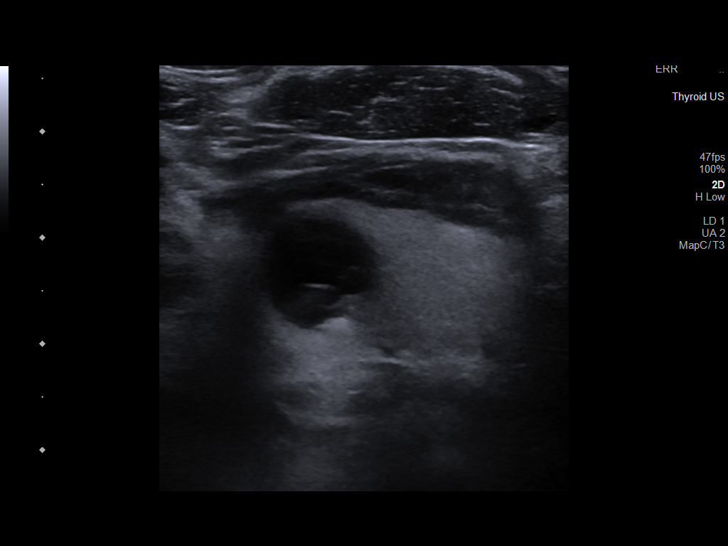
[im 21/42]
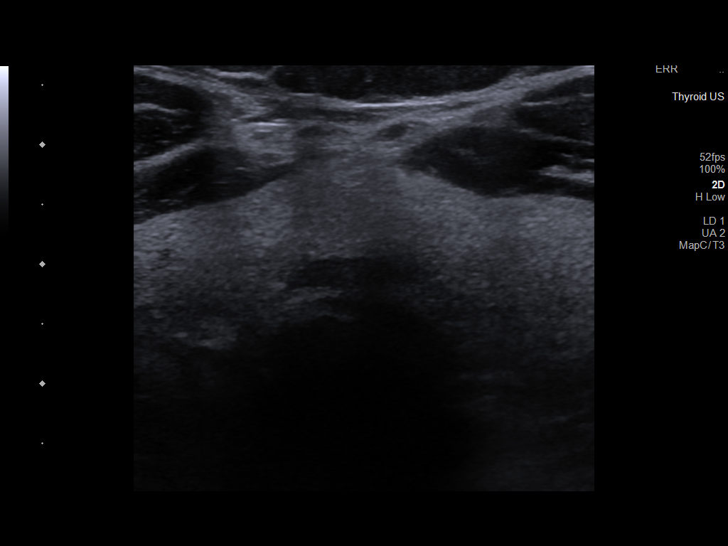
[im 24/42]
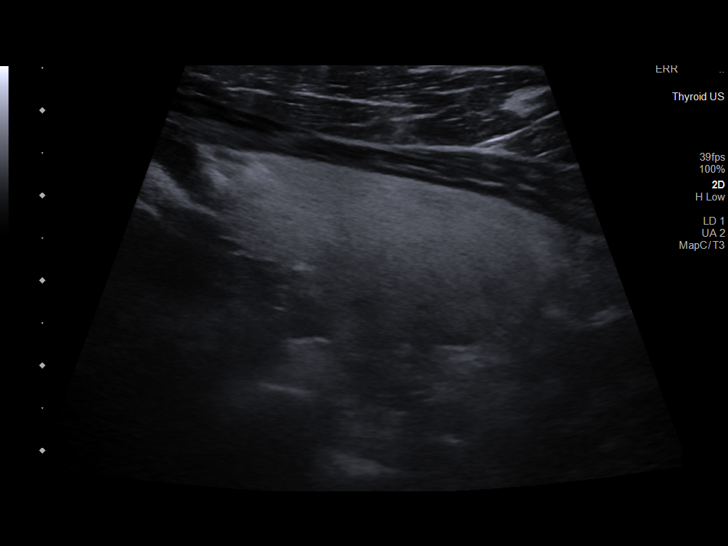
[im 28/42]
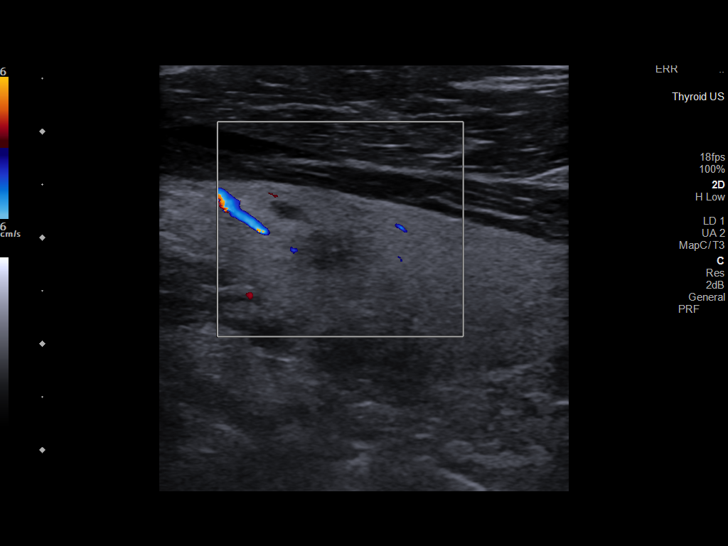
[im 31/42]
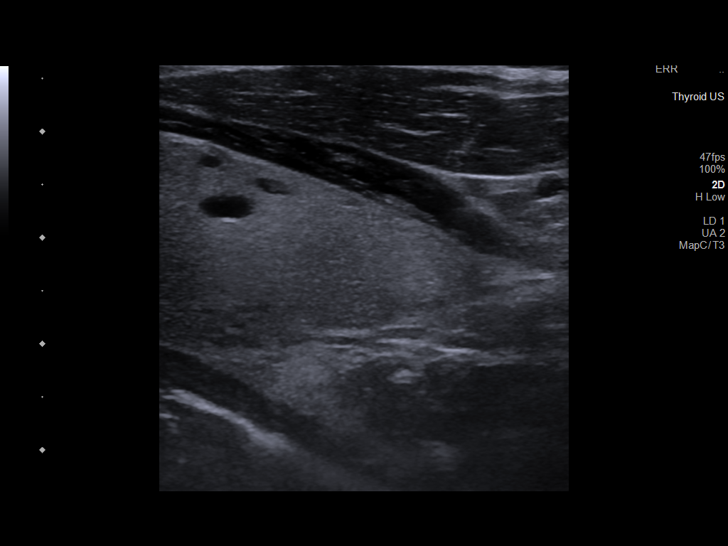
[im 35/42]
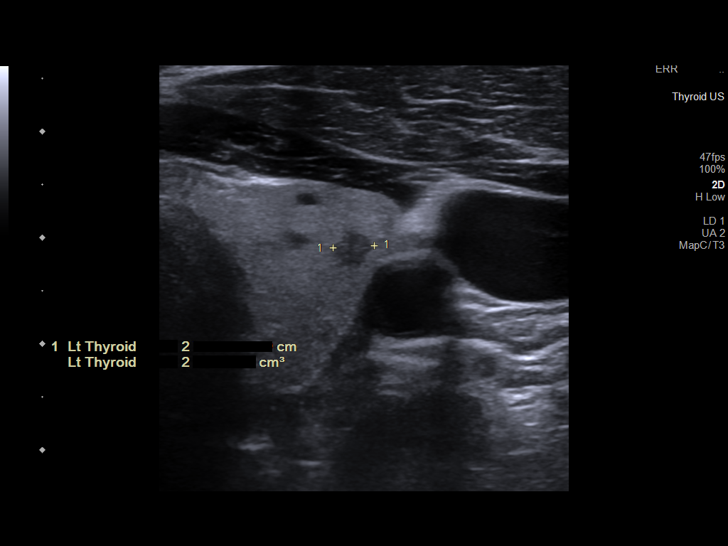
[im 38/42]
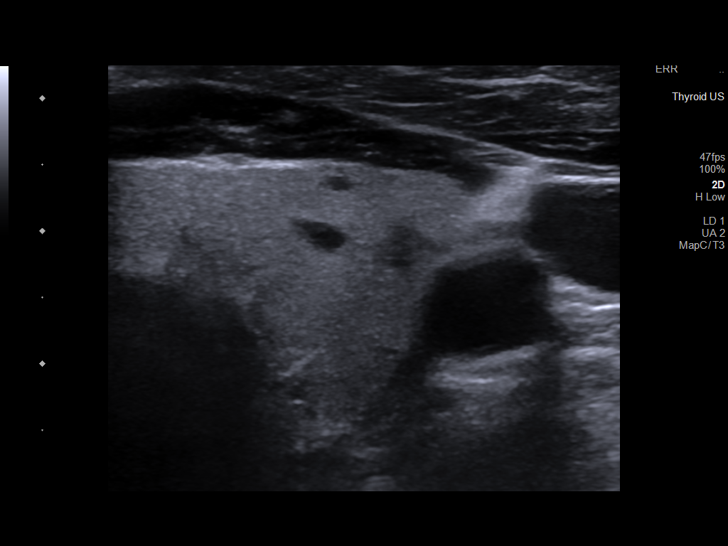
[im 42/42]
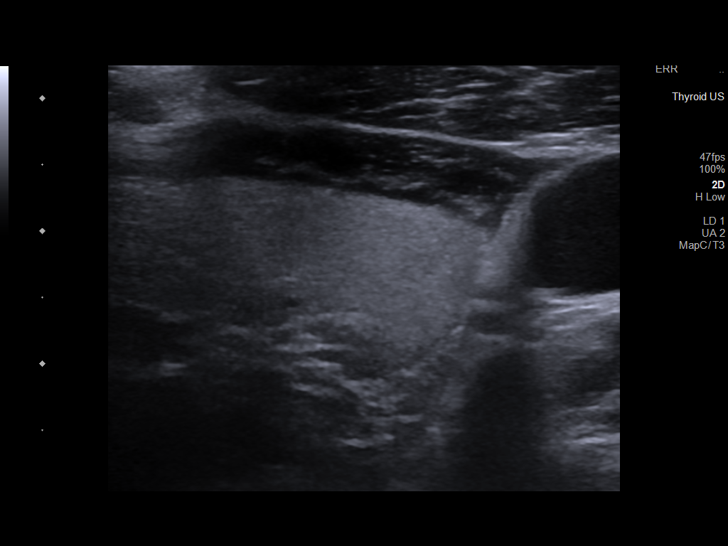

[13 of 25 positions shown; findings below may reference images not displayed]

FINDINGS: Parenchymal Echotexture: Mildly heterogenous

Isthmus: 0.8 cm

Right lobe: 4.8 x 1.7 x 1.7 cm

Left lobe: 4.1 x 1.3 x 1.9 cm

_________________________________________________________

Estimated total number of nodules >/= 1 cm: 1

Number of spongiform nodules >/=  2 cm not described below (TR1): 0

Number of mixed cystic and solid nodules >/= 1.5 cm not described
below (TR2): 0

_________________________________________________________

Nodule # 1:

Location: Right; Inferior

Maximum size: 1.4 cm; Other 2 dimensions: 1.3 x 1.2 cm

Composition: mixed cystic and solid (1)

Echogenicity: hypoechoic (2)

Shape: not taller-than-wide (0)

Margins: smooth (0)

Echogenic foci: none (0)

ACR TI-RADS total points: 3.

ACR TI-RADS risk category: TR3 (3 points).

ACR TI-RADS recommendations:

Given size (<1.4 cm) and appearance, this nodule does NOT meet
TI-RADS criteria for biopsy or dedicated follow-up.

_________________________________________________________

Tiny subcentimeter low-attenuation nodules in the left mid gland
noted incidentally. These do not meet criteria to warrant further
evaluation.
IMPRESSION: 1. Minimally complex cyst in the right inferior thyroid gland does
not meet criteria to warrant further evaluation. No further
follow-up required.
2. Minimally enlarged and heterogeneous thyroid gland.

The above is in keeping with the ACR TI-RADS recommendations - [HOSPITAL] [53];[DATE].

## 2020-05-16 ENCOUNTER — Ambulatory Visit: Payer: Medicare HMO | Admitting: "Endocrinology

## 2020-05-16 ENCOUNTER — Encounter: Payer: Self-pay | Admitting: "Endocrinology

## 2020-05-16 ENCOUNTER — Other Ambulatory Visit: Payer: Self-pay

## 2020-05-16 VITALS — BP 182/96 | HR 80 | Ht 70.0 in | Wt 233.2 lb

## 2020-05-16 DIAGNOSIS — E041 Nontoxic single thyroid nodule: Secondary | ICD-10-CM

## 2020-05-16 DIAGNOSIS — E1122 Type 2 diabetes mellitus with diabetic chronic kidney disease: Secondary | ICD-10-CM | POA: Diagnosis not present

## 2020-05-16 DIAGNOSIS — N185 Chronic kidney disease, stage 5: Secondary | ICD-10-CM

## 2020-05-16 NOTE — Patient Instructions (Signed)

## 2020-05-16 NOTE — Progress Notes (Signed)
05/16/2020, 3:09 PM  Endocrinology follow-up note   Subjective:    Patient ID: Trevor [name], male    DOB: 1940-07-21.  Trevor [name] is being seen in follow-up after he was seen in consultation for management of currently controlled asymptomatic type 2 diabetes, and thyroid nodule. PMD:   Celene Squibb, MD.   Past Medical History:  Diagnosis Date  . Anemia   . Chronic kidney disease    Stage 4?   . Diabetes mellitus without complication ([place])   . Hepatitis    not sure what type - over 20 years ago  . History of kidney stones   . Hyperlipidemia   . Hypertension     Past Surgical History:  Procedure Laterality Date  . AV FISTULA PLACEMENT Left 08/09/2019   Procedure: LEFT ARM Basilic  ARTERIOVENOUS  FISTULA CREATION;  Surgeon: Waynetta Sandy, MD;  Location: [facility];  Service: Vascular;  Laterality: Left;  . BASCILIC VEIN TRANSPOSITION Left 11/30/2019   Procedure: LEFT SECOND STAGE BASCILIC VEIN TRANSPOSITION;  Surgeon: Waynetta Sandy, MD;  Location: [facility];  Service: Vascular;  Laterality: Left;  . COLONOSCOPY N/A 07/07/2017   Procedure: COLONOSCOPY;  Surgeon: Danie Binder, MD;  Location: [facility];  Service: Endoscopy;  Laterality: N/A;  8:30    Social History   Socioeconomic History  . Marital status: Married    Spouse name: Not on file  . Number of children: Not on file  . Years of education: Not on file  . Highest education level: Not on file  Occupational History  . Not on file  Tobacco Use  . Smoking status: Never Smoker  . Smokeless tobacco: Never Used  Vaping Use  . Vaping Use: Never used  Substance and Sexual Activity  . Alcohol use: Never  . Drug use: Never  . Sexual activity: Not on file  Other Topics Concern  . Not on file  Social History Narrative  . Not on file   Social Determinants of Health   Financial Resource Strain: Not on file   Food Insecurity: Not on file  Transportation Needs: Not on file  Physical Activity: Not on file  Stress: Not on file  Social Connections: Not on file    Family History  Problem Relation Age of Onset  . Diabetes Mother   . Diabetes Brother   . Heart attack Brother   . Colon cancer Son   . Lung cancer Son     Outpatient Encounter Medications as of 05/16/2020  Medication Sig  . Accu-Chek Softclix Lancets lancets   . Alcohol Swabs (B-D SINGLE USE SWABS REGULAR) PADS   . aspirin EC 81 MG tablet Take 81 mg by mouth daily.  . Blood Glucose Monitoring Suppl (ONETOUCH VERIO) w/Device KIT 1 each by Does not apply route as needed.  . carvedilol (COREG) 3.125 MG tablet Take 6.25 mg by mouth 2 (two) times daily with a meal.   . Cholecalciferol 25 MCG (1000 UT) tablet Take 1,000 Units by mouth daily.   . cloNIDine (CATAPRES) 0.2 MG tablet Take 0.2 mg by mouth 3 (three) times daily.   Marland Kitchen  furosemide (LASIX) 40 MG tablet Take 40 mg by mouth 2 (two) times daily as needed.  . Garlic 4540 MG CAPS Take 1,000 mg by mouth daily. (Patient not taking: Reported on 05/02/2020)  . glucose blood (ONETOUCH VERIO) test strip Use as instructed  . HYDROcodone-acetaminophen (NORCO/VICODIN) 5-325 MG tablet Take 1 tablet by mouth every 4 (four) hours as needed. (Patient not taking: Reported on 05/02/2020)  . lidocaine (LIDODERM) 5 % Place 1 patch onto the skin daily. Remove & Discard patch within 12 hours or as directed by MD (Patient not taking: Reported on 05/02/2020)  . OVER THE COUNTER MEDICATION Take 1 tablet by mouth daily. Kidney Cleanser   . rosuvastatin (CRESTOR) 10 MG tablet Take 10 mg by mouth daily.   . sodium bicarbonate 650 MG tablet Take 650 mg by mouth 3 (three) times daily.   . TRESIBA FLEXTOUCH 200 UNIT/ML SOPN Inject 10 Units into the skin at bedtime.   No facility-administered encounter medications on file as of 05/16/2020.    ALLERGIES: No Known Allergies  VACCINATION STATUS: Immunization  History  Administered Date(s) Administered  . Influenza-Unspecified 02/11/2018    Diabetes He presents for his follow-up diabetic visit. He has type 2 diabetes mellitus. Onset time: He was diagnosed at approximate age of 50 years. His disease course has been improving. There are no hypoglycemic associated symptoms. Pertinent negatives for hypoglycemia include no confusion, headaches, pallor or seizures. There are no diabetic associated symptoms. Pertinent negatives for diabetes include no chest pain, no fatigue, no polydipsia, no polyphagia, no polyuria and no weakness. There are no hypoglycemic complications. Symptoms are improving. Diabetic complications include nephropathy. Risk factors for coronary artery disease include dyslipidemia, hypertension, male sex, obesity, sedentary lifestyle and diabetes mellitus. Current diabetic treatment includes insulin injections (He is using [place] 10 to 20 units nightly.). His weight is fluctuating minimally. He is following a generally unhealthy diet. When asked about meal planning, he reported none. He has not had a previous visit with a dietitian. He rarely participates in exercise. His home blood glucose trend is decreasing steadily. His breakfast blood glucose range is generally 110-130 mg/dl. His bedtime blood glucose range is generally 140-180 mg/dl. His overall blood glucose range is 140-180 mg/dl. (He presents with controlled glycemic profile both fasting and postprandial.  His recent A1c was 6.4%.  He is on Tresiba 10 units nightly, monitoring blood glucose twice a day.) Eye exam is current.  Hyperlipidemia This is a chronic problem. The current episode started more than 1 year ago. The problem is controlled. Exacerbating diseases include chronic renal disease and diabetes. Pertinent negatives include no chest pain, myalgias or shortness of breath. Current antihyperlipidemic treatment includes statins. Risk factors for coronary artery disease include  diabetes mellitus, dyslipidemia, male sex, obesity, hypertension and a sedentary lifestyle.  Hypertension This is a chronic problem. The current episode started more than 1 year ago. The problem is uncontrolled. Pertinent negatives include no chest pain, headaches, neck pain, palpitations or shortness of breath. Risk factors for coronary artery disease include diabetes mellitus, dyslipidemia, male gender, obesity and sedentary lifestyle. Past treatments include central alpha agonists. Hypertensive end-organ damage includes kidney disease. Identifiable causes of hypertension include chronic renal disease and a thyroid problem.  Thyroid Problem Presents for initial visit. Onset time: Patient was found to have 53m right lobe nodule on CT scan. Patient reports no constipation, diarrhea, fatigue or palpitations. Past treatments include nothing. The following procedures have not been performed: thyroid FNA, thyroid ultrasound and thyroidectomy. His  past medical history is significant for diabetes, hyperlipidemia and obesity.     Review of Systems  Constitutional: Negative for chills, fatigue, fever and unexpected weight change.  HENT: Negative for dental problem, mouth sores and trouble swallowing.   Eyes: Negative for visual disturbance.  Respiratory: Negative for cough, choking, chest tightness, shortness of breath and wheezing.   Cardiovascular: Negative for chest pain, palpitations and leg swelling.  Gastrointestinal: Negative for abdominal distention, abdominal pain, constipation, diarrhea, nausea and vomiting.  Endocrine: Negative for polydipsia, polyphagia and polyuria.  Genitourinary: Negative for dysuria, flank pain, hematuria and urgency.  Musculoskeletal: Negative for back pain, gait problem, myalgias and neck pain.  Skin: Negative for pallor, rash and wound.  Neurological: Negative for seizures, syncope, weakness, numbness and headaches.  Psychiatric/Behavioral: Negative for confusion and  dysphoric mood.    Objective:    Vitals with BMI 05/16/2020 05/02/2020 04/30/2020  Height 5' 10" 5' 10" -  Weight 233 lbs 3 oz 236 lbs 3 oz -  BMI 16.10 96.04 -  Systolic 540 981 191  Diastolic 96 70 69  Pulse 80 60 67    BP (!) 182/96   Pulse 80   Ht 5' 10" (1.778 m)   Wt 233 lb 3.2 oz (105.8 kg)   BMI 33.46 kg/m   Wt Readings from Last 3 Encounters:  05/16/20 233 lb 3.2 oz (105.8 kg)  05/02/20 236 lb 3.2 oz (107.1 kg)  04/30/20 240 lb (108.9 kg)     Physical Exam Constitutional:      General: He is not in acute distress.    Appearance: He is well-developed.  HENT:     Head: Normocephalic and atraumatic.  Neck:     Thyroid: No thyromegaly.     Trachea: No tracheal deviation.     Comments: Thyroid is palpable, not grossly enlarged. Cardiovascular:     Rate and Rhythm: Normal rate.     Pulses:          Dorsalis pedis pulses are 1+ on the right side and 1+ on the left side.       Posterior tibial pulses are 1+ on the right side and 1+ on the left side.     Heart sounds: S1 normal and S2 normal. No murmur heard. No gallop.   Pulmonary:     Effort: No respiratory distress.     Breath sounds: No wheezing.  Abdominal:     General: There is no distension.     Tenderness: There is no abdominal tenderness. There is no guarding.  Musculoskeletal:        General: No swelling.     Right shoulder: No swelling or deformity.     Cervical back: Normal range of motion and neck supple.     Right lower leg: No edema.     Left lower leg: No edema.  Skin:    General: Skin is warm and dry.     Findings: No rash.     Nails: There is no clubbing.  Neurological:     Mental Status: He is alert and oriented to person, place, and time.     Cranial Nerves: No cranial nerve deficit.     Sensory: No sensory deficit.     Gait: Gait normal.     Deep Tendon Reflexes: Reflexes are normal and symmetric.  Psychiatric:        Speech: Speech normal.        Behavior: Behavior normal. Behavior  is cooperative.  Thought Content: Thought content normal.        Judgment: Judgment normal.       CMP ( most recent) CMP     Component Value Date/Time   NA 141 11/30/2019 0619   K 3.9 11/30/2019 0619   CL 104 11/30/2019 0619   GLUCOSE 120 (H) 11/30/2019 0619   BUN 60 (A) 04/30/2020 0000   CREATININE 5.0 (A) 04/30/2020 0000   CREATININE 4.00 (H) 11/30/2019 0619   CALCIUM 8.7 02/15/2020 0000   ALBUMIN 3.4 (A) 02/15/2020 0000   GFRNONAA 10 04/30/2020 0000   GFRAA 12 04/30/2020 0000     Diabetic Labs (most recent): Lab Results  Component Value Date   HGBA1C 6.2 05/02/2020    Recent Results (from the past 2160 hour(s))  Basic metabolic panel     Status: Abnormal   Collection Time: 04/30/20 12:00 AM  Result Value Ref Range   BUN 60 (A) 4 - 21   Creatinine 5.0 (A) 0.6 - 1.3  Comprehensive metabolic panel     Status: None   Collection Time: 04/30/20 12:00 AM  Result Value Ref Range   GFR calc Af Amer 12    GFR calc non Af Amer 10   Hemoglobin A1c     Status: None   Collection Time: 05/02/20 12:00 AM  Result Value Ref Range   Hemoglobin A1C 6.2   T4, free     Status: None   Collection Time: 05/03/20  2:05 PM  Result Value Ref Range   Free T4 1.32 0.82 - 1.77 ng/dL  T3, free     Status: None   Collection Time: 05/03/20  2:05 PM  Result Value Ref Range   T3, Free 2.6 2.0 - 4.4 pg/mL  TSH     Status: None   Collection Time: 05/03/20  2:05 PM  Result Value Ref Range   TSH 3.740 0.450 - 4.500 uIU/mL   Thyroid ultrasound on May 09, 2020: Right lobe 4.8 cm with 1.4 cm complex/cystic nodule which does not meet any criteria for biopsy or follow-up.  Left lobe measuring 4.1 cm.   Assessment & Plan:   1. Diabetes mellitus with stage 5 chronic kidney disease (HCC)  - Trevor [name] has currently uncontrolled symptomatic type 2 DM since  80 years of age.   He presents with controlled glycemic profile both fasting and postprandial.  His recent A1c was 6.2%.   He is on Tresiba 10 units nightly, monitoring blood glucose twice a day.  - I had a long discussion with him about the progressive nature of diabetes and the pathology behind its complications. -his diabetes is complicated by obesity/sedentary life, end-stage renal disease and he remains at a high risk for more acute and chronic complications which include CAD, CVA, CKD, retinopathy, and neuropathy. These are all discussed in detail with him.  - I have counseled him on diet  and weight management  by adopting a carbohydrate restricted/protein rich diet. Patient is encouraged to switch to  unprocessed or minimally processed     complex starch and increased protein intake (animal or plant source), fruits, and vegetables. -  he is advised to stick to a routine mealtimes to eat 3 meals  a day and avoid unnecessary snacks ( to snack only to correct hypoglycemia).   - he acknowledges that there is a room for improvement in his food and drink choices. - Suggestion is made for him to avoid simple carbohydrates  from his diet including Cakes,  Sweet Desserts, Ice Cream, Soda (diet and regular), Sweet Tea, Candies, Chips, Cookies, Store Bought Juices, Alcohol in Excess of  1-2 drinks a day, Artificial Sweeteners,  Coffee Creamer, and "Sugar-free" Products, Lemonade. This will help patient to have more stable blood glucose profile and potentially avoid unintended weight gain.   - he will be scheduled with Jearld Fenton, RDN, CDE for diabetes education.  - I have approached him with the following individualized plan to manage  his diabetes and patient agrees:   -given his recent A1c of 6.2%, and his presentation with controlled glycemic profile, he will not need any prandial insulin at this time.  -In light of the fact he does not have a safe oral medication options to treat diabetes, he is advised to continue Tresiba 10 units nightly associated with monitoring of blood glucose twice a day-daily before  breakfast and at bedtime.   -He is advised to use his own meter from this point on, and history of sharing a meter with his wife.  - he is encouraged to call clinic for blood glucose levels less than 70 or above 200 mg /dl at fasting.  - he is not a candidate for metformin, SGLT2 inhibitors due to concurrent renal insufficiency.  - he will be considered for incretin therapy as appropriate next visit.  - Specific targets for  A1c;  LDL, HDL,  and Triglycerides were discussed with the patient.  2) Blood Pressure /Hypertension:  his blood pressure is not controlled to target.     he is advised to continue his current medications including clonidine 0.2 mg 3 times a day, Coreg 6.25  mg p.o. twice daily , advised to maintain close follow-up with his nephrology.    3) Lipids/Hyperlipidemia:   Review of his recent lipid panel showed  controlled  LDL at 73 .  he  is advised to continue    Crestor 10 mg daily at bedtime.  Side effects and precautions discussed with him.  4)  Weight/Diet:  Body mass index is 33.46 kg/m.  -   clearly complicating his diabetes care.   he is  a candidate for weight loss. I discussed with him the fact that loss of 5 - 10% of his  current body weight will have the most impact on his diabetes management.  Exercise, and detailed carbohydrates information provided  -  detailed on discharge instructions.   5) thyroid nodule: I reviewed his recent thyroid function test, as well as thyroid ultrasound with him.   Incidental finding of 49m nodule in the right lobe of his thyroid.  His thyroid function tests are consistent with euthyroid state.  His ultrasound confirmed 1.4 cm cystic nodule on the inferior portion of the right lobe of her thyroid.  This nodule does not meet criteria for biopsy or follow-up.  He is reassured, may need follow-up ultrasound in 1-2 years.   6) Chronic Care/Health Maintenance:  -he  is on  Statin medications and  is encouraged to initiate and continue to  follow up with Ophthalmology, Dentist,  Podiatrist at least yearly or according to recommendations, and advised to   stay away from smoking. I have recommended yearly flu vaccine and pneumonia vaccine at least every 5 years; moderate intensity exercise for up to 150 minutes weekly; and  sleep for at least 7 hours a day.  - he is  advised to maintain close follow up with HCelene Squibb MD for primary care needs, as well as his other providers  for optimal and coordinated care.   - Time spent on this patient care encounter:  35 min, of which > 50% was spent in  counseling and the rest reviewing his blood glucose logs , discussing his hypoglycemia and hyperglycemia episodes, reviewing his current and  previous labs / studies  ( including abstraction from other facilities) and medications  doses and developing a  long term treatment plan and documenting his care.   Please refer to Patient Instructions for Blood Glucose Monitoring and Insulin/Medications Dosing Guide"  in media tab for additional information. Please  also refer to " Patient Self Inventory" in the Media  tab for reviewed elements of pertinent patient history.  Kathrynn Running Rottinghaus participated in the discussions, expressed understanding, and voiced agreement with the above plans.  All questions were answered to his satisfaction. he is encouraged to contact clinic should he have any questions or concerns prior to his return visit.    Follow up plan: - Return in about 1 year (around 05/16/2021) for F/U with Pre-visit Labs, Meter, Logs, A1c here.Glade Lloyd, MD [facility] [address] [place], [place] [address] Phone: [phone]  Fax: [phone]    05/16/2020, 3:09 PM  This note was partially dictated with voice recognition software. Similar sounding words can be transcribed inadequately or may not  be corrected upon review.

## 2020-05-30 DIAGNOSIS — E041 Nontoxic single thyroid nodule: Secondary | ICD-10-CM | POA: Diagnosis not present

## 2020-05-30 DIAGNOSIS — D631 Anemia in chronic kidney disease: Secondary | ICD-10-CM | POA: Diagnosis not present

## 2020-05-30 DIAGNOSIS — I129 Hypertensive chronic kidney disease with stage 1 through stage 4 chronic kidney disease, or unspecified chronic kidney disease: Secondary | ICD-10-CM | POA: Diagnosis not present

## 2020-05-30 DIAGNOSIS — E1129 Type 2 diabetes mellitus with other diabetic kidney complication: Secondary | ICD-10-CM | POA: Diagnosis not present

## 2020-05-30 DIAGNOSIS — N189 Chronic kidney disease, unspecified: Secondary | ICD-10-CM | POA: Diagnosis not present

## 2020-05-30 DIAGNOSIS — E1122 Type 2 diabetes mellitus with diabetic chronic kidney disease: Secondary | ICD-10-CM | POA: Diagnosis not present

## 2020-05-30 DIAGNOSIS — N185 Chronic kidney disease, stage 5: Secondary | ICD-10-CM | POA: Diagnosis not present

## 2020-05-30 DIAGNOSIS — E211 Secondary hyperparathyroidism, not elsewhere classified: Secondary | ICD-10-CM | POA: Diagnosis not present

## 2020-05-30 DIAGNOSIS — R809 Proteinuria, unspecified: Secondary | ICD-10-CM | POA: Diagnosis not present

## 2020-06-03 ENCOUNTER — Encounter: Payer: Self-pay | Admitting: Internal Medicine

## 2020-06-14 DIAGNOSIS — R808 Other proteinuria: Secondary | ICD-10-CM | POA: Diagnosis not present

## 2020-06-14 DIAGNOSIS — N189 Chronic kidney disease, unspecified: Secondary | ICD-10-CM | POA: Diagnosis not present

## 2020-06-14 DIAGNOSIS — E1122 Type 2 diabetes mellitus with diabetic chronic kidney disease: Secondary | ICD-10-CM | POA: Diagnosis not present

## 2020-06-14 DIAGNOSIS — E211 Secondary hyperparathyroidism, not elsewhere classified: Secondary | ICD-10-CM | POA: Diagnosis not present

## 2020-06-14 DIAGNOSIS — E872 Acidosis: Secondary | ICD-10-CM | POA: Diagnosis not present

## 2020-06-14 DIAGNOSIS — N185 Chronic kidney disease, stage 5: Secondary | ICD-10-CM | POA: Diagnosis not present

## 2020-06-14 DIAGNOSIS — R809 Proteinuria, unspecified: Secondary | ICD-10-CM | POA: Diagnosis not present

## 2020-06-14 DIAGNOSIS — I129 Hypertensive chronic kidney disease with stage 1 through stage 4 chronic kidney disease, or unspecified chronic kidney disease: Secondary | ICD-10-CM | POA: Diagnosis not present

## 2020-06-14 DIAGNOSIS — D631 Anemia in chronic kidney disease: Secondary | ICD-10-CM | POA: Diagnosis not present

## 2020-06-20 ENCOUNTER — Ambulatory Visit (HOSPITAL_COMMUNITY)
Admission: RE | Admit: 2020-06-20 | Discharge: 2020-06-20 | Disposition: A | Discharge status: Home / Self Care | Payer: Medicare HMO | Source: Ambulatory Visit | Attending: Nephrology | Admitting: Nephrology

## 2020-06-20 ENCOUNTER — Other Ambulatory Visit (HOSPITAL_COMMUNITY): Payer: Self-pay | Admitting: Nephrology

## 2020-06-20 DIAGNOSIS — N185 Chronic kidney disease, stage 5: Secondary | ICD-10-CM

## 2020-06-20 DIAGNOSIS — R0602 Shortness of breath: Secondary | ICD-10-CM | POA: Diagnosis not present

## 2020-06-20 IMAGING — DX DG CHEST 2V
2 series · 2 of 2 positions shown · non-contrast
Comparison: No priors.

CLINICAL DATA: 79-year-old male with history of chronic kidney
disease. Shortness of breath on exertion for 1 month.

EXAM:
CHEST - 2 VIEW

[chest pa]
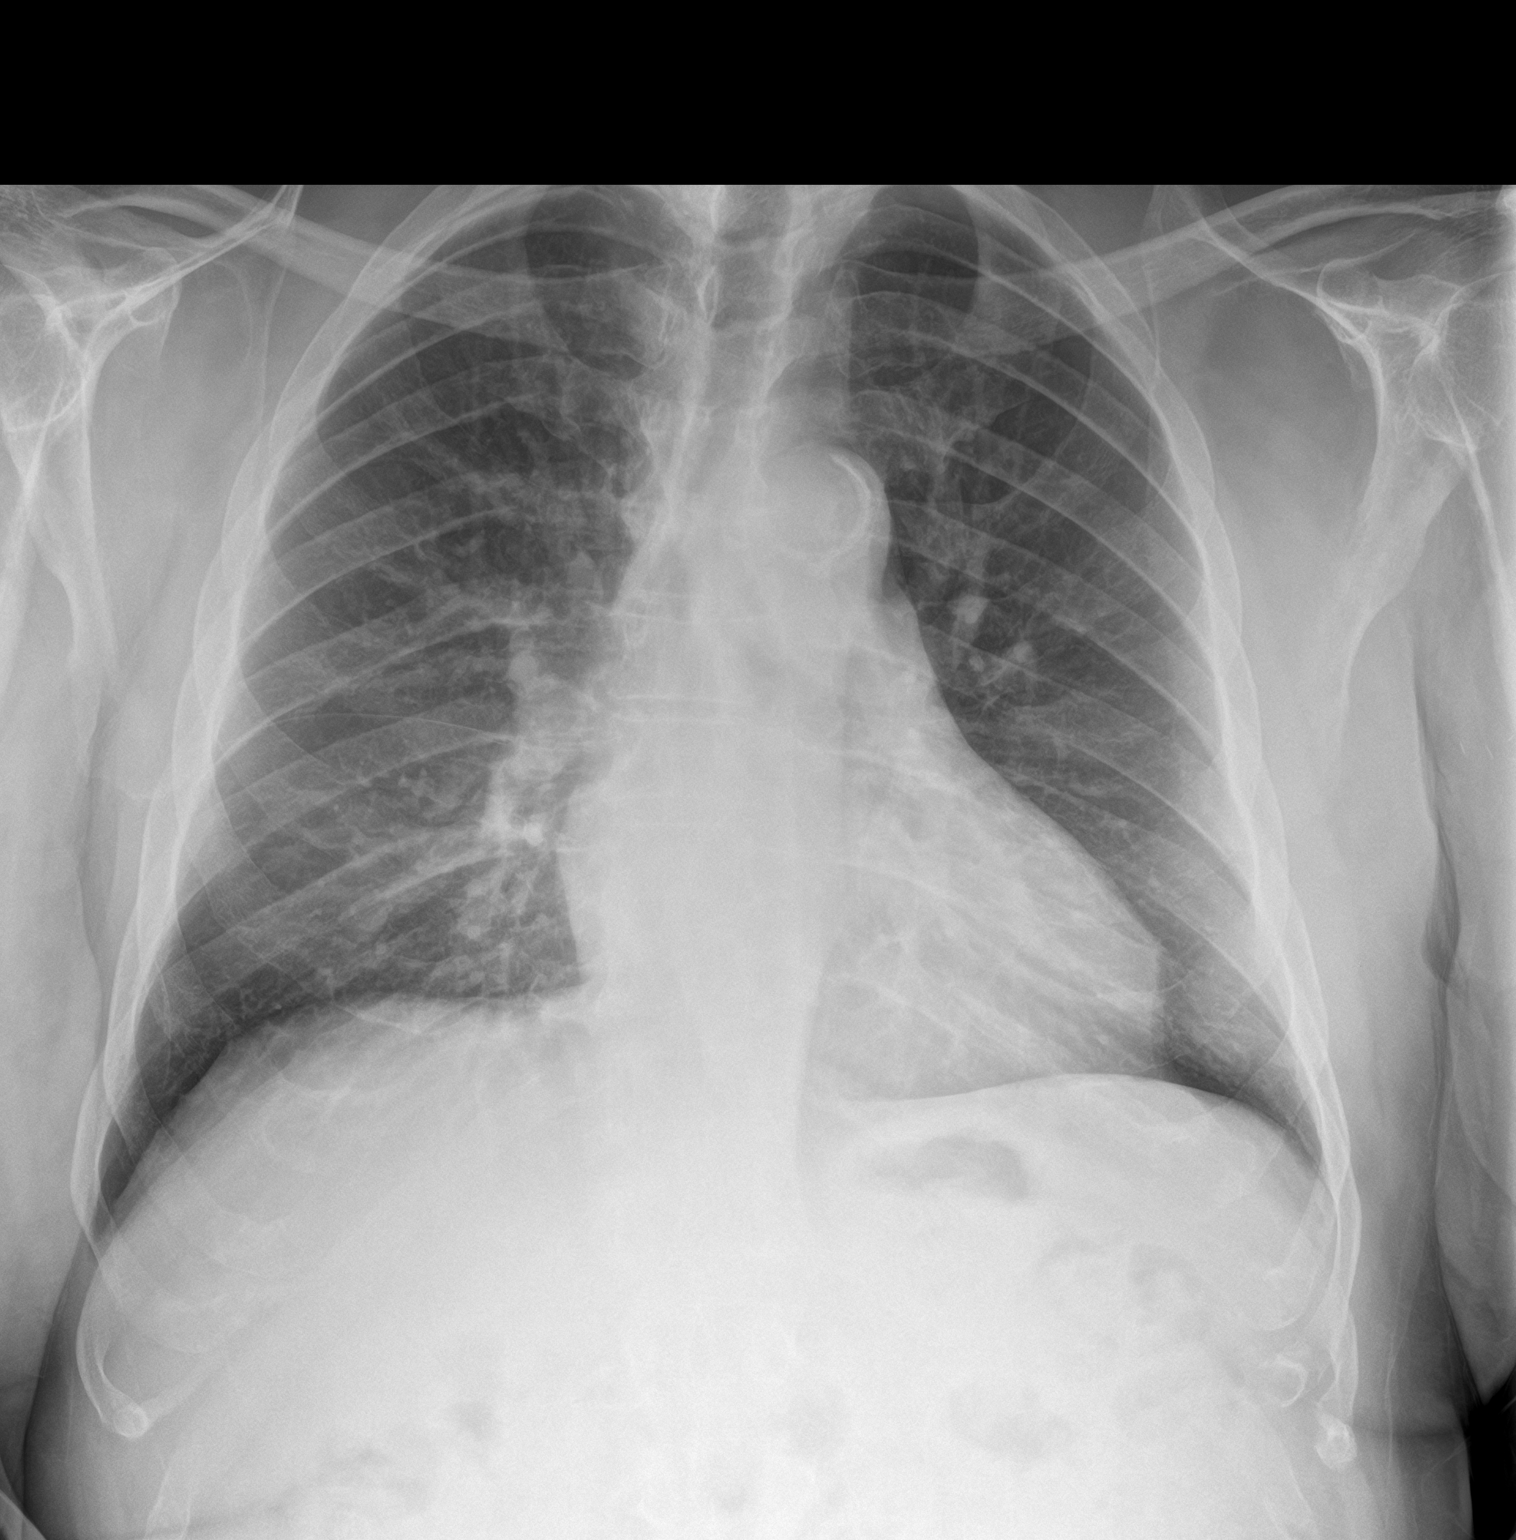

[chest lat]
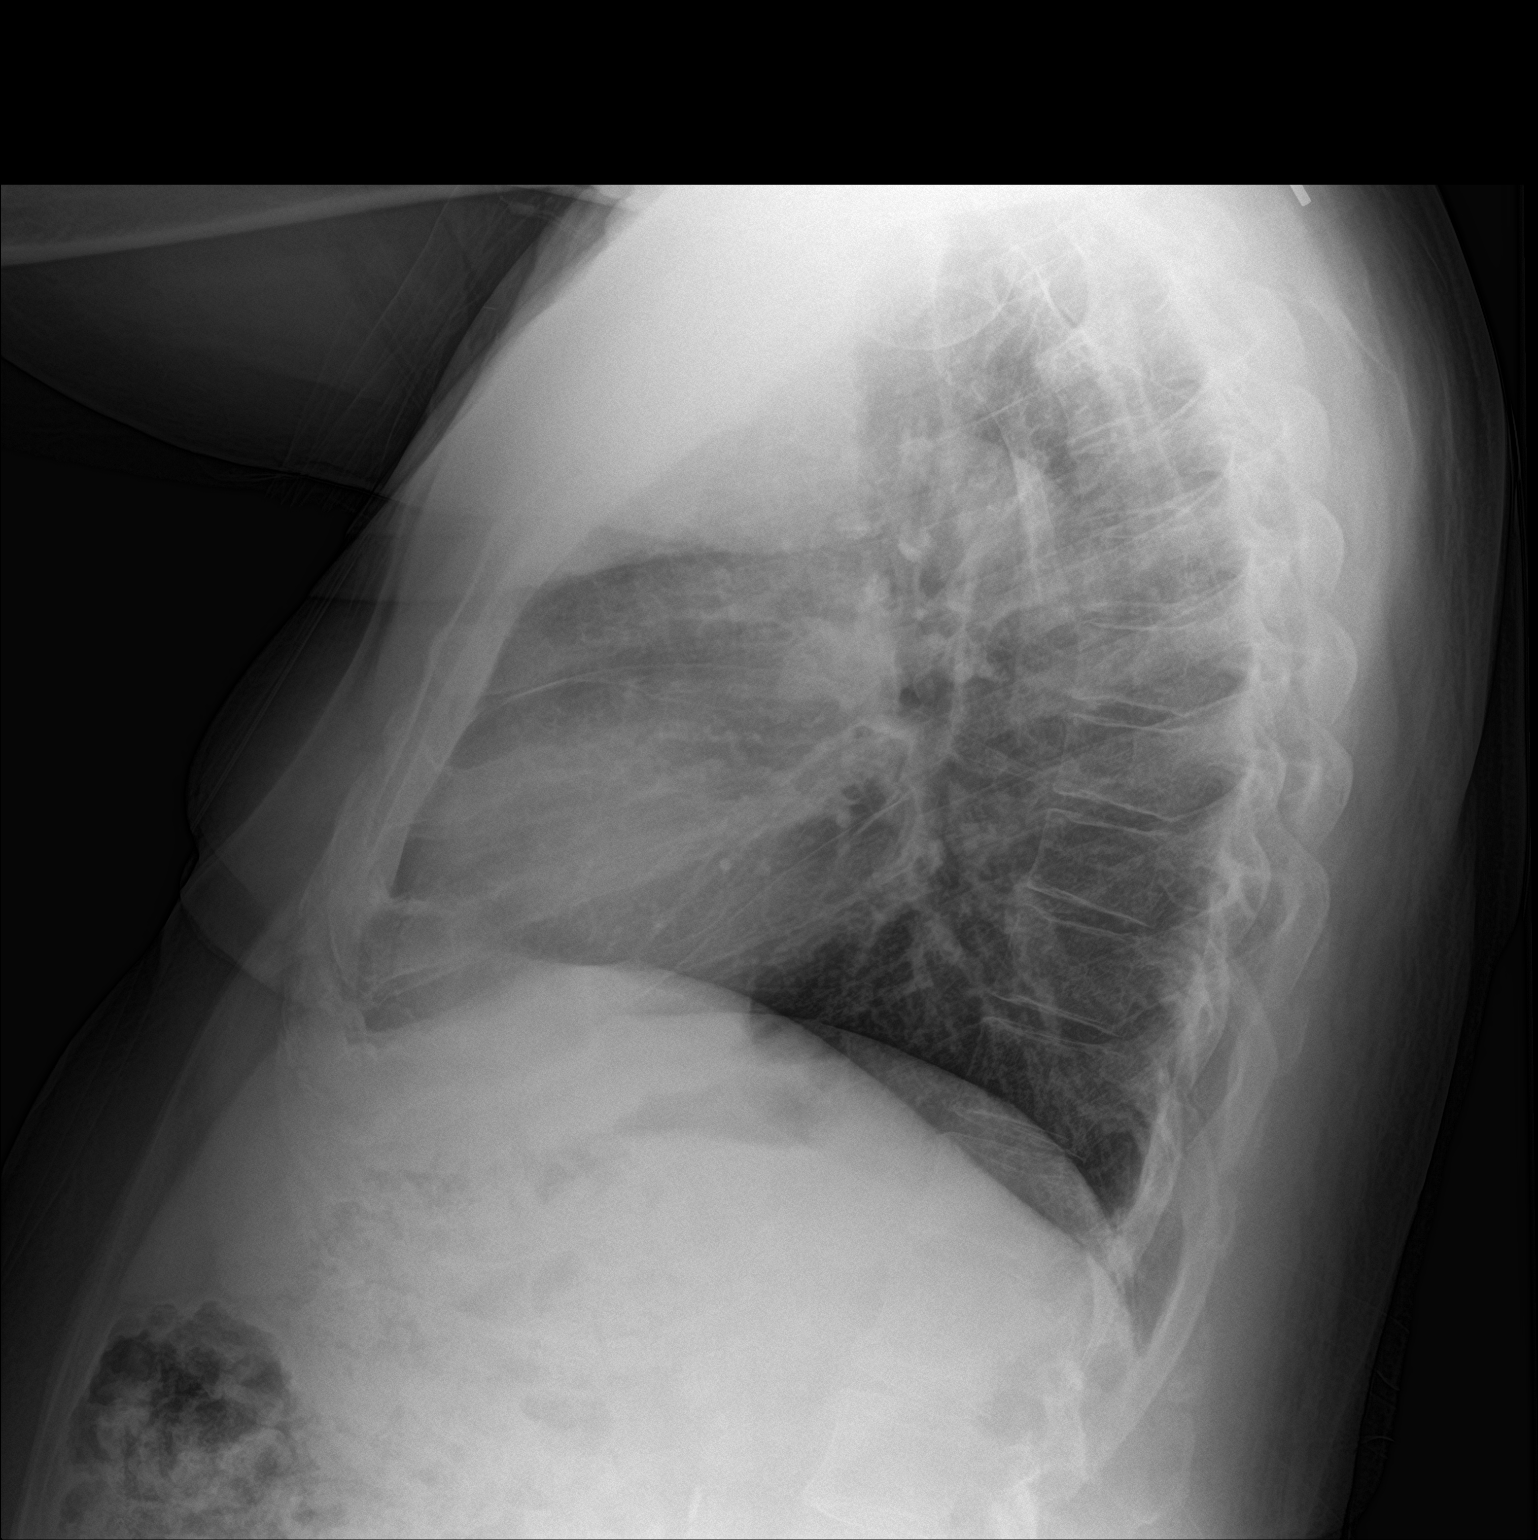

[2 of 2 positions shown; findings below may reference images not displayed]

FINDINGS: Lung volumes are normal. No consolidative airspace disease. No
pleural effusions. No pneumothorax. No pulmonary nodule or mass
noted. Pulmonary vasculature and the cardiomediastinal silhouette
are within normal limits. Atherosclerotic calcifications in the
thoracic aorta.
IMPRESSION: 1.  No radiographic evidence of acute cardiopulmonary disease.
2. Aortic atherosclerosis.

## 2020-06-27 DIAGNOSIS — E211 Secondary hyperparathyroidism, not elsewhere classified: Secondary | ICD-10-CM | POA: Diagnosis not present

## 2020-06-27 DIAGNOSIS — N185 Chronic kidney disease, stage 5: Secondary | ICD-10-CM | POA: Diagnosis not present

## 2020-06-27 DIAGNOSIS — E1129 Type 2 diabetes mellitus with other diabetic kidney complication: Secondary | ICD-10-CM | POA: Diagnosis not present

## 2020-06-27 DIAGNOSIS — N189 Chronic kidney disease, unspecified: Secondary | ICD-10-CM | POA: Diagnosis not present

## 2020-06-27 DIAGNOSIS — E1122 Type 2 diabetes mellitus with diabetic chronic kidney disease: Secondary | ICD-10-CM | POA: Diagnosis not present

## 2020-06-27 DIAGNOSIS — R808 Other proteinuria: Secondary | ICD-10-CM | POA: Diagnosis not present

## 2020-06-27 DIAGNOSIS — D631 Anemia in chronic kidney disease: Secondary | ICD-10-CM | POA: Diagnosis not present

## 2020-06-27 DIAGNOSIS — R809 Proteinuria, unspecified: Secondary | ICD-10-CM | POA: Diagnosis not present

## 2020-06-27 DIAGNOSIS — I129 Hypertensive chronic kidney disease with stage 1 through stage 4 chronic kidney disease, or unspecified chronic kidney disease: Secondary | ICD-10-CM | POA: Diagnosis not present

## 2020-06-28 DIAGNOSIS — R808 Other proteinuria: Secondary | ICD-10-CM | POA: Diagnosis not present

## 2020-06-28 DIAGNOSIS — R809 Proteinuria, unspecified: Secondary | ICD-10-CM | POA: Diagnosis not present

## 2020-06-28 DIAGNOSIS — E875 Hyperkalemia: Secondary | ICD-10-CM | POA: Diagnosis not present

## 2020-06-28 DIAGNOSIS — E1122 Type 2 diabetes mellitus with diabetic chronic kidney disease: Secondary | ICD-10-CM | POA: Diagnosis not present

## 2020-06-28 DIAGNOSIS — D631 Anemia in chronic kidney disease: Secondary | ICD-10-CM | POA: Diagnosis not present

## 2020-06-28 DIAGNOSIS — N189 Chronic kidney disease, unspecified: Secondary | ICD-10-CM | POA: Diagnosis not present

## 2020-06-28 DIAGNOSIS — N185 Chronic kidney disease, stage 5: Secondary | ICD-10-CM | POA: Diagnosis not present

## 2020-06-28 DIAGNOSIS — I129 Hypertensive chronic kidney disease with stage 1 through stage 4 chronic kidney disease, or unspecified chronic kidney disease: Secondary | ICD-10-CM | POA: Diagnosis not present

## 2020-06-28 DIAGNOSIS — E211 Secondary hyperparathyroidism, not elsewhere classified: Secondary | ICD-10-CM | POA: Diagnosis not present

## 2020-07-10 DIAGNOSIS — N185 Chronic kidney disease, stage 5: Secondary | ICD-10-CM | POA: Diagnosis not present

## 2020-07-10 DIAGNOSIS — Z79899 Other long term (current) drug therapy: Secondary | ICD-10-CM | POA: Diagnosis not present

## 2020-07-12 DIAGNOSIS — R809 Proteinuria, unspecified: Secondary | ICD-10-CM | POA: Diagnosis not present

## 2020-07-12 DIAGNOSIS — E1122 Type 2 diabetes mellitus with diabetic chronic kidney disease: Secondary | ICD-10-CM | POA: Diagnosis not present

## 2020-07-12 DIAGNOSIS — E875 Hyperkalemia: Secondary | ICD-10-CM | POA: Diagnosis not present

## 2020-07-12 DIAGNOSIS — D631 Anemia in chronic kidney disease: Secondary | ICD-10-CM | POA: Diagnosis not present

## 2020-07-12 DIAGNOSIS — R808 Other proteinuria: Secondary | ICD-10-CM | POA: Diagnosis not present

## 2020-07-12 DIAGNOSIS — N189 Chronic kidney disease, unspecified: Secondary | ICD-10-CM | POA: Diagnosis not present

## 2020-07-12 DIAGNOSIS — N185 Chronic kidney disease, stage 5: Secondary | ICD-10-CM | POA: Diagnosis not present

## 2020-07-12 DIAGNOSIS — R7612 Nonspecific reaction to cell mediated immunity measurement of gamma interferon antigen response without active tuberculosis: Secondary | ICD-10-CM | POA: Diagnosis not present

## 2020-07-12 DIAGNOSIS — I129 Hypertensive chronic kidney disease with stage 1 through stage 4 chronic kidney disease, or unspecified chronic kidney disease: Secondary | ICD-10-CM | POA: Diagnosis not present

## 2020-07-25 ENCOUNTER — Other Ambulatory Visit: Payer: Self-pay

## 2020-07-25 ENCOUNTER — Ambulatory Visit: Payer: Medicare HMO | Admitting: Infectious Diseases

## 2020-07-25 VITALS — HR 72 | Temp 97.9°F | Wt 242.0 lb

## 2020-07-25 DIAGNOSIS — Z227 Latent tuberculosis: Secondary | ICD-10-CM | POA: Insufficient documentation

## 2020-07-25 DIAGNOSIS — Z5181 Encounter for therapeutic drug level monitoring: Secondary | ICD-10-CM

## 2020-07-25 MED ORDER — ISONIAZID 300 MG PO TABS: 300.0000 mg | ORAL_TABLET | Freq: Every day | ORAL | 3 refills | Status: DC

## 2020-07-25 MED ORDER — VITAMIN B-6 50 MG PO TABS: 50.0000 mg | ORAL_TABLET | Freq: Every day | ORAL | 3 refills | Status: DC

## 2020-07-25 NOTE — Progress Notes (Addendum)
[facility]                                      [address], [place], [place], [address]                                               Phn. [phone]; Fax: [phone]                                                               Date: 07/24/20  Reason for Visit: Positive Quantiferon  Requesting Provider: Manpreet Bhutani    HPI: Trevor [name] is a 80 y.o.old male with PMH of DM, CKD with plans for HD, Kidney stones, HLD, HTN referred for evaluation and treatment of positive quantiferon.   Patient denies ever being told to have positive quantiferon before this recent test on 06/27/20. Denies ever being treated for it.  Denies cough more than 2 weeks, night sweats, SOB, loss of appetite and loss of weight.  Occasional on and of SOB+. Denies nausea, vomiting, abdominal pain, diarrhea, GU symptoms. Denies joint pain/swelling or GU symptoms   Risk factors: Grandfather had ? Tuberculosis ( he is not sure) Born in [place], Lived in [place] 2 years, stayed most of his life in [place] Traveled [place], 25 years ago. 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            Denies fever, chills, nightsweats, nausea, vomiting, diarrhea, constipation, weight loss, recent hospitalizations, rashes, joint complaints, shortness of breath, chest pain, headaches, dysuria .  Current Outpatient Medications on File Prior to Visit  Medication Sig Dispense Refill  . Accu-Chek Softclix Lancets lancets     . Alcohol Swabs (B-D SINGLE USE SWABS REGULAR) PADS     . aspirin EC 81 MG tablet Take 81 mg by mouth daily.    . Blood Glucose Monitoring Suppl (ONETOUCH VERIO)  w/Device KIT 1 each by Does not apply route as needed. 1 kit 0  . carvedilol (COREG) 3.125 MG tablet Take 6.25 mg by mouth 2 (two) times daily with a meal.     . Cholecalciferol 25 MCG (1000 UT) tablet Take 1,000 Units by mouth daily.     . cloNIDine (CATAPRES) 0.2 MG tablet Take 0.2 mg by mouth 3 (three) times daily.     . furosemide (LASIX) 40 MG tablet Take 40 mg by mouth 2 (two) times daily as needed.    . Garlic 4627 MG CAPS Take 1,000 mg by mouth daily. (Patient not taking: Reported on 05/02/2020)    . glucose blood (ONETOUCH VERIO) test strip Use as instructed 300 each 2  . HYDROcodone-acetaminophen (NORCO/VICODIN) 5-325 MG tablet Take 1 tablet by mouth every 4 (four) hours as needed. (Patient not taking: Reported on 05/02/2020) 6 tablet 0  .  lidocaine (LIDODERM) 5 % Place 1 patch onto the skin daily. Remove & Discard patch within 12 hours or as directed by MD (Patient not taking: Reported on 05/02/2020) 30 patch 0  . OVER THE COUNTER MEDICATION Take 1 tablet by mouth daily. Kidney Cleanser     . rosuvastatin (CRESTOR) 10 MG tablet Take 10 mg by mouth daily.     . sodium bicarbonate 650 MG tablet Take 650 mg by mouth 3 (three) times daily.     . TRESIBA FLEXTOUCH 200 UNIT/ML SOPN Inject 10 Units into the skin at bedtime.     No current facility-administered medications on file prior to visit.     No Known Allergies  Past Medical History:  Diagnosis Date  . Anemia   . Chronic kidney disease    Stage 4?   . Diabetes mellitus without complication ([place])   . Hepatitis    not sure what type - over 20 years ago  . History of kidney stones   . Hyperlipidemia   . Hypertension    Past Surgical History:  Procedure Laterality Date  . AV FISTULA PLACEMENT Left 08/09/2019   Procedure: LEFT ARM Basilic  ARTERIOVENOUS  FISTULA CREATION;  Surgeon: Waynetta Sandy, MD;  Location: [facility];  Service: Vascular;  Laterality: Left;  . BASCILIC VEIN TRANSPOSITION Left 11/30/2019   Procedure:  LEFT SECOND STAGE BASCILIC VEIN TRANSPOSITION;  Surgeon: Waynetta Sandy, MD;  Location: [facility];  Service: Vascular;  Laterality: Left;  . COLONOSCOPY N/A 07/07/2017   Procedure: COLONOSCOPY;  Surgeon: Danie Binder, MD;  [facility];  Service: Endoscopy;  Laterality: N/A;  8:30   Social History   Socioeconomic History  . Marital status: Married    Spouse name: Not on file  . Number of children: Not on file  . Years of education: Not on file  . Highest education level: Not on file  Occupational History  . Not on file  Tobacco Use  . Smoking status: Never Smoker  . Smokeless tobacco: Never Used  Vaping Use  . Vaping Use: Never used  Substance and Sexual Activity  . Alcohol use: Never  . Drug use: Never  . Sexual activity: Not on file  Other Topics Concern  . Not on file  Social History Narrative  . Not on file   Social Determinants of Health   Financial Resource Strain: Not on file  Food Insecurity: Not on file  Transportation Needs: Not on file  Physical Activity: Not on file  Stress: Not on file  Social Connections: Not on file  Intimate Partner Violence: Not on file   Vitals  Pulse 72   Temp 97.9 F (36.6 C) (Oral)   Wt 242 lb (109.8 kg)   SpO2 99%   BMI 34.72 kg/m   Gen: Alert and oriented x 3, no acute distress HEENT: [place]/AT, PERL, no scleral icterus, no pale conjunctivae, hearing normal, oral mucosa moist Neck: Supple, no lymphadenopathy Cardio: Regular rate and rhythm; +S1 and S2; no murmurs, gallops, or rubs Resp: CTAB; no wheezes, rhonchi, or rales GI: Soft, nontender, nondistended, bowel sounds present GU: Musc: Extremities: No cyanosis, clubbing, or edema; +2 PT and DP pulses Skin: No rashes, lesions, or ecchymoses Neuro: Grossly non focal  Psych: Calm, cooperative   Laboratory  06/27/20 HepaB surface ag negative Hep B core IgM negative Hep B surface ab 19, immune Hep C virus ab <0.1 Quantiferon TB gold positive     Pertinent Imaging Chest xray  06/21/20 FINDINGS: Lung volumes are normal. No consolidative airspace disease. No pleural effusions. No pneumothorax. No pulmonary nodule or mass noted. Pulmonary vasculature and the cardiomediastinal silhouette are within normal limits. Atherosclerotic calcifications in the thoracic aorta.  IMPRESSION: 1.  No radiographic evidence of acute cardiopulmonary disease. 2. Aortic atherosclerosis.  Assessment/Plan: Problem List Items Addressed This Visit   None   Visit Diagnoses    Latent tuberculosis    -  Primary   Relevant Orders   Hepatic function panel   Medication monitoring encounter          Positive Quantiferon/latent TB  Discussed about Quantiferon test, latent TB and active TB at length Discussed about different treatment options for treatment of latent TB including its side effects  Patient preferred to take Insoniazid and Pyridoxine for 6-9 months + Pyridoxine given DDIs associated with Rifamycin based regimens Follow up in 4 weeks Orders Placed This Encounter  Procedures  . Hepatic function panel   CKD/HLD/HTN Per PCP and Nephrology   Patient's labs were reviewed as well as his previous records. Patients questions were addressed and answered.   I spent greater than 60 minutes for this patient encounter including greater than 50% of time in face to face counsel of the patient and in coordination of their care.  Electronically signed by:  Rosiland Oz, MD Infectious Diseases  Office phone [phone] Fax no. [phone]

## 2020-07-26 LAB — HEPATIC FUNCTION PANEL
AG Ratio: 1.4 (calc) (ref 1.0–2.5)
ALT: 21 U/L (ref 9–46)
AST: 23 U/L (ref 10–35)
Albumin: 3.8 g/dL (ref 3.6–5.1)
Alkaline phosphatase (APISO): 45 U/L (ref 35–144)
Bilirubin, Direct: 0.1 mg/dL (ref 0.0–0.2)
Globulin: 2.7 g/dL (calc) (ref 1.9–3.7)
Indirect Bilirubin: 0.3 mg/dL (calc) (ref 0.2–1.2)
Total Bilirubin: 0.4 mg/dL (ref 0.2–1.2)
Total Protein: 6.5 g/dL (ref 6.1–8.1)

## 2020-08-13 ENCOUNTER — Emergency Department (HOSPITAL_COMMUNITY)
Admission: EM | Admit: 2020-08-13 | Discharge: 2020-08-13 | Discharge status: Against Medical Advice | Payer: Medicare HMO | Attending: Emergency Medicine | Admitting: Emergency Medicine

## 2020-08-13 ENCOUNTER — Encounter (HOSPITAL_COMMUNITY): Payer: Self-pay | Admitting: *Deleted

## 2020-08-13 ENCOUNTER — Other Ambulatory Visit: Payer: Self-pay

## 2020-08-13 ENCOUNTER — Emergency Department (HOSPITAL_COMMUNITY): Payer: Medicare HMO

## 2020-08-13 DIAGNOSIS — M25471 Effusion, right ankle: Secondary | ICD-10-CM | POA: Diagnosis not present

## 2020-08-13 DIAGNOSIS — Z7982 Long term (current) use of aspirin: Secondary | ICD-10-CM | POA: Diagnosis not present

## 2020-08-13 DIAGNOSIS — I120 Hypertensive chronic kidney disease with stage 5 chronic kidney disease or end stage renal disease: Secondary | ICD-10-CM | POA: Diagnosis not present

## 2020-08-13 DIAGNOSIS — N185 Chronic kidney disease, stage 5: Secondary | ICD-10-CM | POA: Diagnosis not present

## 2020-08-13 DIAGNOSIS — R0602 Shortness of breath: Secondary | ICD-10-CM | POA: Diagnosis not present

## 2020-08-13 DIAGNOSIS — D649 Anemia, unspecified: Secondary | ICD-10-CM | POA: Diagnosis not present

## 2020-08-13 DIAGNOSIS — R0601 Orthopnea: Secondary | ICD-10-CM | POA: Diagnosis not present

## 2020-08-13 DIAGNOSIS — Z79899 Other long term (current) drug therapy: Secondary | ICD-10-CM | POA: Diagnosis not present

## 2020-08-13 DIAGNOSIS — I132 Hypertensive heart and chronic kidney disease with heart failure and with stage 5 chronic kidney disease, or end stage renal disease: Secondary | ICD-10-CM | POA: Diagnosis not present

## 2020-08-13 DIAGNOSIS — I5042 Chronic combined systolic (congestive) and diastolic (congestive) heart failure: Secondary | ICD-10-CM

## 2020-08-13 DIAGNOSIS — M25472 Effusion, left ankle: Secondary | ICD-10-CM | POA: Diagnosis not present

## 2020-08-13 DIAGNOSIS — E1122 Type 2 diabetes mellitus with diabetic chronic kidney disease: Secondary | ICD-10-CM | POA: Insufficient documentation

## 2020-08-13 DIAGNOSIS — I10 Essential (primary) hypertension: Secondary | ICD-10-CM

## 2020-08-13 DIAGNOSIS — R059 Cough, unspecified: Secondary | ICD-10-CM | POA: Diagnosis not present

## 2020-08-13 DIAGNOSIS — D631 Anemia in chronic kidney disease: Secondary | ICD-10-CM | POA: Diagnosis not present

## 2020-08-13 DIAGNOSIS — I509 Heart failure, unspecified: Secondary | ICD-10-CM | POA: Diagnosis not present

## 2020-08-13 LAB — COMPREHENSIVE METABOLIC PANEL
ALT: 16 U/L (ref 0–44)
AST: 18 U/L (ref 15–41)
Albumin: 3.6 g/dL (ref 3.5–5.0)
Alkaline Phosphatase: 39 U/L (ref 38–126)
Anion gap: 10 (ref 5–15)
BUN: 76 mg/dL — ABNORMAL HIGH (ref 8–23)
CO2: 23 mmol/L (ref 22–32)
Calcium: 8 mg/dL — ABNORMAL LOW (ref 8.9–10.3)
Chloride: 104 mmol/L (ref 98–111)
Creatinine, Ser: 6.11 mg/dL — ABNORMAL HIGH (ref 0.61–1.24)
GFR, Estimated: 9 mL/min — ABNORMAL LOW (ref >60)
Glucose, Bld: 206 mg/dL — ABNORMAL HIGH (ref 70–99)
Potassium: 4.1 mmol/L (ref 3.5–5.1)
Sodium: 137 mmol/L (ref 135–145)
Total Bilirubin: 0.6 mg/dL (ref 0.3–1.2)
Total Protein: 6.9 g/dL (ref 6.5–8.1)

## 2020-08-13 LAB — CBC
HCT: 25.4 % — ABNORMAL LOW (ref 39.0–52.0)
Hemoglobin: 8.3 g/dL — ABNORMAL LOW (ref 13.0–17.0)
MCH: 31 pg (ref 26.0–34.0)
MCHC: 32.7 g/dL (ref 30.0–36.0)
MCV: 94.8 fL (ref 80.0–100.0)
Platelets: 257 10*3/uL (ref 150–400)
RBC: 2.68 MIL/uL — ABNORMAL LOW (ref 4.22–5.81)
RDW: 13.1 % (ref 11.5–15.5)
WBC: 10.3 10*3/uL (ref 4.0–10.5)
nRBC: 0 % (ref 0.0–0.2)

## 2020-08-13 LAB — D-DIMER, QUANTITATIVE: D-Dimer, Quant: 1.34 ug/mL-FEU — ABNORMAL HIGH (ref 0.00–0.50)

## 2020-08-13 LAB — TROPONIN I (HIGH SENSITIVITY): Troponin I (High Sensitivity): 5 ng/L (ref <18)

## 2020-08-13 LAB — BRAIN NATRIURETIC PEPTIDE: B Natriuretic Peptide: 501 pg/mL — ABNORMAL HIGH (ref 0.0–100.0)

## 2020-08-13 IMAGING — DX DG CHEST 2V
2 series · 2 of 2 positions shown · non-contrast
Comparison: [DATE].

CLINICAL DATA: Short of breath and cough since last night.

EXAM:
CHEST - 2 VIEW

[chest pa]
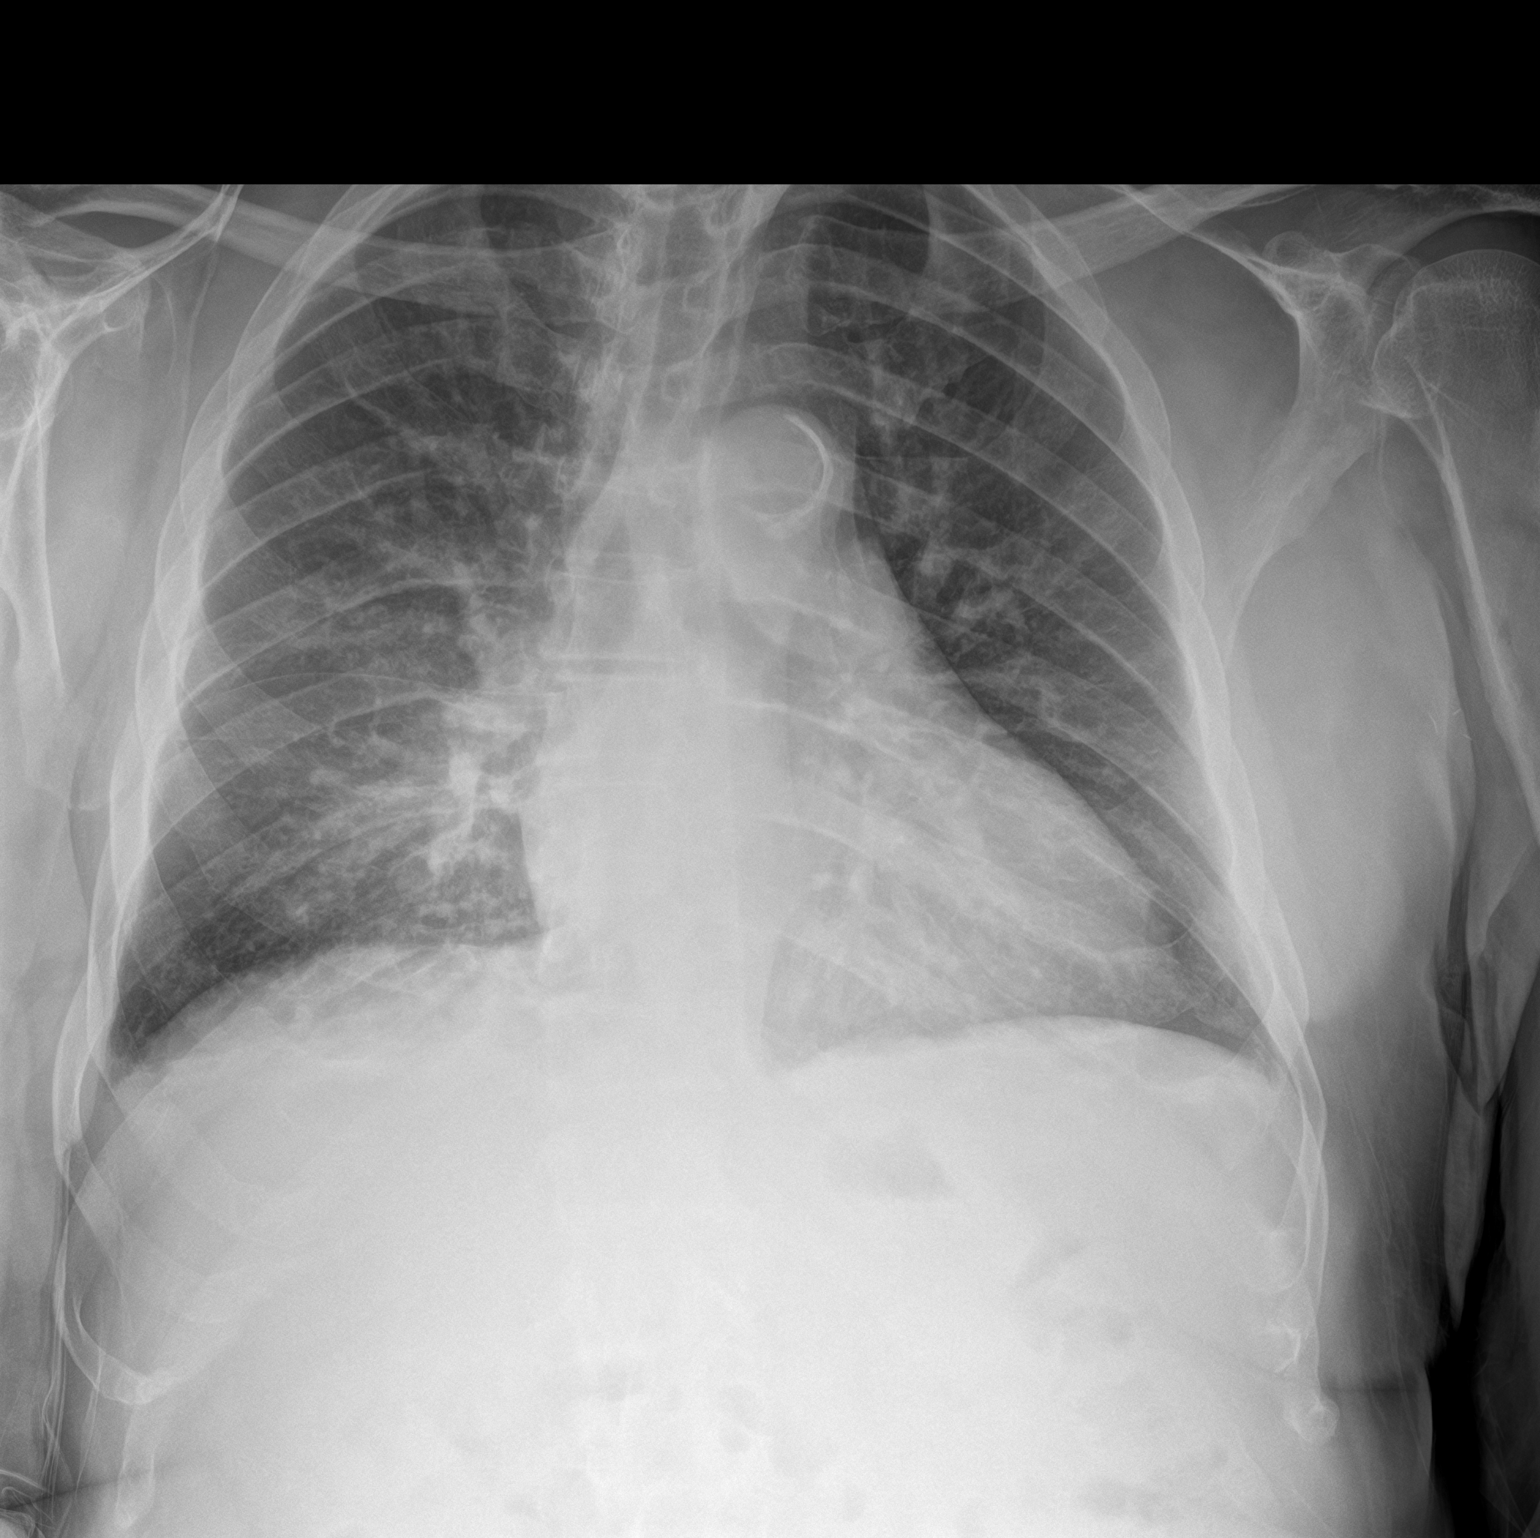

[chest lat]
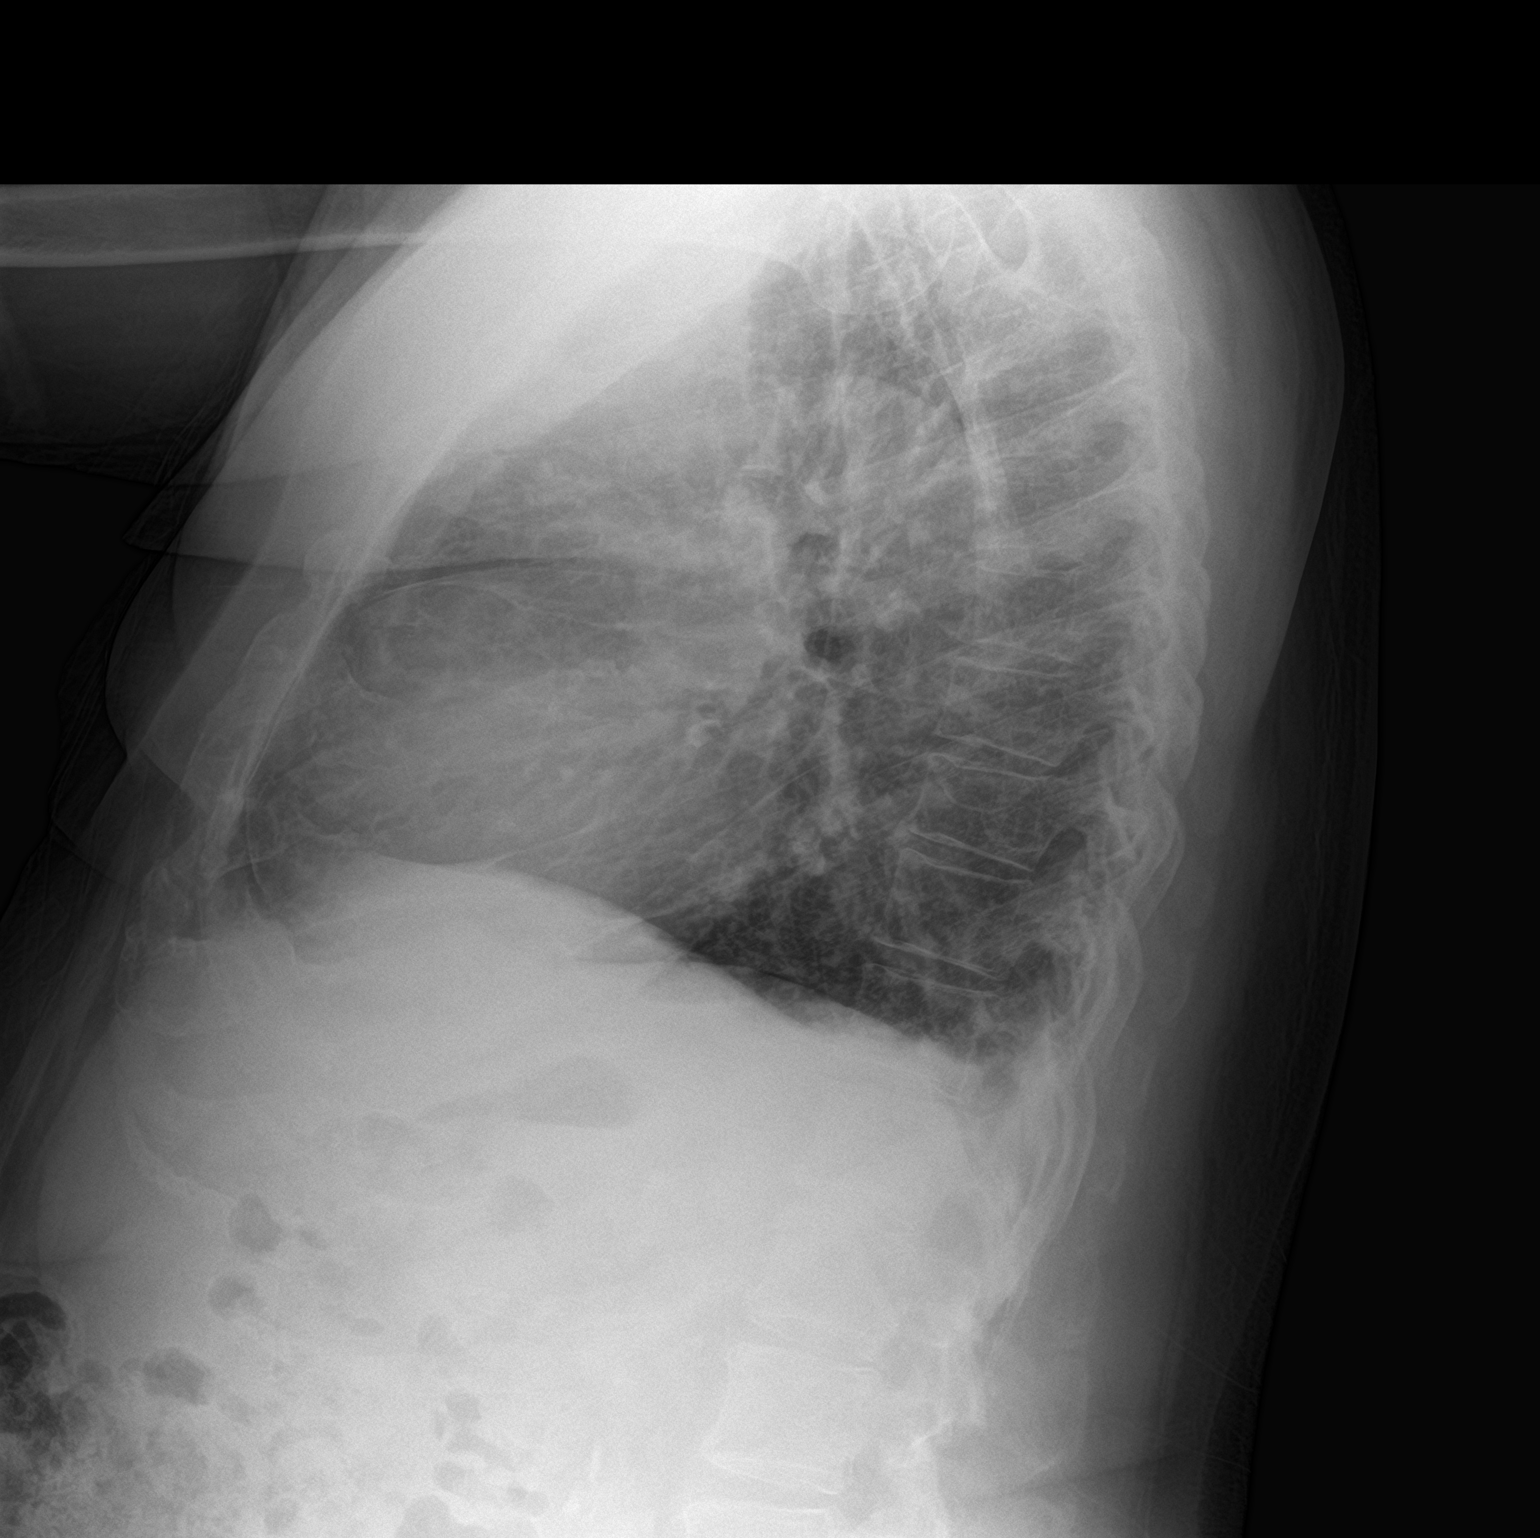

[2 of 2 positions shown; findings below may reference images not displayed]

FINDINGS: Cardiac silhouette is normal in size. No mediastinal or hilar
masses. No adenopathy.

Subtle opacity noted at the posterolateral right lung base. This is
likely atelectasis, with infection possible. Remainder of the lungs
is clear.

No pleural effusion or pneumothorax.

Skeletal structures are intact.
IMPRESSION: 1. Mild opacity posterolateral right lung base suspected to be
atelectasis with pneumonia possible. No other evidence of acute
cardiopulmonary disease.

## 2020-08-13 NOTE — ED Provider Notes (Addendum)
[facility] EMERGENCY DEPARTMENT Provider Note   CSN: [id number] Arrival date & time: 08/13/20  1506     History Chief Complaint  Patient presents with  . Shortness of Breath    Trevor [name] is a 80 y.o. male.  Patient w hx ckd, dm, presents indicating has felt sob in past couple days. Symptoms gradual onset, constant, mild, persistent, occur at rest. No associated cp or discomfort. No cough or uri symptoms. No fever or chills. +orthopnea and bilateral ankle swelling. States had been on lasix but stopped taking for a couple weeks, restarted yesterday (and has adequate supply at home). Is urinating a normal amount, no trouble voiding or emptying bladder. Denies unilateral leg pain or swelling. No hx dvt or pe.   The history is provided by the patient.  Shortness of Breath Associated symptoms: no abdominal pain, no chest pain, no cough, no fever, no headaches, no neck pain, no rash, no sore throat and no vomiting        Past Medical History:  Diagnosis Date  . Anemia   . Chronic kidney disease    Stage 4?   . Diabetes mellitus without complication ([place])   . Hepatitis    not sure what type - over 20 years ago  . History of kidney stones   . Hyperlipidemia   . Hypertension     Patient Active Problem List   Diagnosis Date Noted  . TB lung, latent 07/25/2020  . Diabetes mellitus with stage 5 chronic kidney disease ([place]) 05/02/2020  . Thyroid nodule 05/02/2020  . Special screening for malignant neoplasms, colon     Past Surgical History:  Procedure Laterality Date  . AV FISTULA PLACEMENT Left 08/09/2019   Procedure: LEFT ARM Basilic  ARTERIOVENOUS  FISTULA CREATION;  Surgeon: Waynetta Sandy, MD;  Location: [facility];  Service: Vascular;  Laterality: Left;  . BASCILIC VEIN TRANSPOSITION Left 11/30/2019   Procedure: LEFT SECOND STAGE BASCILIC VEIN TRANSPOSITION;  Surgeon: Waynetta Sandy, MD;  Location: [facility];  Service: Vascular;  Laterality: Left;  .  COLONOSCOPY N/A 07/07/2017   Procedure: COLONOSCOPY;  Surgeon: Danie Binder, MD;  [facility];  Service: Endoscopy;  Laterality: N/A;  8:30       Family History  Problem Relation Age of Onset  . Diabetes Mother   . Diabetes Brother   . Heart attack Brother   . Colon cancer Son   . Lung cancer Son     Social History   Tobacco Use  . Smoking status: Never Smoker  . Smokeless tobacco: Never Used  Vaping Use  . Vaping Use: Never used  Substance Use Topics  . Alcohol use: Never  . Drug use: Never    Home Medications Prior to Admission medications   Medication Sig Start Date End Date Taking? Authorizing Provider  Accu-Chek Softclix Lancets lancets  09/13/19   [provider]  Alcohol Swabs (B-D SINGLE USE SWABS REGULAR) PADS  08/11/19   [provider]  aspirin EC 81 MG tablet Take 81 mg by mouth daily.    [provider]  Blood Glucose Monitoring Suppl (ONETOUCH VERIO) w/Device KIT 1 each by Does not apply route as needed. 05/03/20   Cassandria Anger, MD  carvedilol (COREG) 3.125 MG tablet Take 6.25 mg by mouth 2 (two) times daily with a meal.  10/18/19 10/17/20  [provider]  Cholecalciferol 25 MCG (1000 UT) tablet Take 1,000 Units by mouth daily.  12/23/09  [provider]  cloNIDine (CATAPRES) 0.2 MG tablet Take 0.2 mg by mouth 3 (three) times daily.  04/12/17   [provider]  furosemide (LASIX) 40 MG tablet Take 40 mg by mouth 2 (two) times daily as needed. 07/14/19 11/20/19  [provider]  Garlic 0037 MG CAPS Take 1,000 mg by mouth daily. Patient not taking: No sig reported    [provider]  glucose blood (ONETOUCH VERIO) test strip Use as instructed 05/03/20   Cassandria Anger, MD  HYDROcodone-acetaminophen (NORCO/VICODIN) 5-325 MG tablet Take 1 tablet by mouth every 4 (four) hours as needed. Patient not taking: No sig reported 04/30/20   Henderly, Britni A, PA-C  isoniazid  (NYDRAZID) 300 MG tablet Take 1 tablet (300 mg total) by mouth daily. 07/25/20   Rosiland Oz, MD  lidocaine (LIDODERM) 5 % Place 1 patch onto the skin daily. Remove & Discard patch within 12 hours or as directed by MD Patient not taking: No sig reported 04/30/20   Henderly, Britni A, PA-C  OVER THE COUNTER MEDICATION Take 1 tablet by mouth daily. Kidney Cleanser    [provider]  pyridOXINE (VITAMIN B-6) 50 MG tablet Take 1 tablet (50 mg total) by mouth daily. 07/25/20   Rosiland Oz, MD  rosuvastatin (CRESTOR) 10 MG tablet Take 10 mg by mouth daily.  01/26/17   [provider]  sodium bicarbonate 650 MG tablet Take 650 mg by mouth 3 (three) times daily.  07/24/19   [provider]  TRESIBA FLEXTOUCH 200 UNIT/ML SOPN Inject 10 Units into the skin at bedtime. 04/15/17   [provider]    Allergies    Patient has no known allergies.  Review of Systems   Review of Systems  Constitutional: Negative for chills and fever.  HENT: Negative for sore throat.   Eyes: Negative for redness.  Respiratory: Positive for shortness of breath. Negative for cough.   Cardiovascular: Negative for chest pain.  Gastrointestinal: Negative for abdominal pain and vomiting.  Genitourinary: Negative for flank pain.  Musculoskeletal: Negative for back pain and neck pain.  Skin: Negative for rash.  Neurological: Negative for headaches.  Hematological: Does not bruise/bleed easily.  Psychiatric/Behavioral: Negative for confusion.    Physical Exam Updated Vital Signs BP (!) 216/87   Pulse 80   Temp 98.6 F (37 C) (Oral)   Resp (!) 22   Ht 1.791 m (5' 10.5")   Wt 110.1 kg   SpO2 98%   BMI 34.34 kg/m   Physical Exam Vitals and nursing note reviewed.  Constitutional:      Appearance: Normal appearance. He is well-developed.  HENT:     Head: Atraumatic.     Nose: Nose normal.     Mouth/Throat:     Mouth: Mucous membranes are moist.     Pharynx: Oropharynx is  clear.  Eyes:     General: No scleral icterus.    Conjunctiva/sclera: Conjunctivae normal.  Neck:     Trachea: No tracheal deviation.  Cardiovascular:     Rate and Rhythm: Normal rate and regular rhythm.     Pulses: Normal pulses.     Heart sounds: Normal heart sounds. No murmur heard. No friction rub. No gallop.   Pulmonary:     Effort: Pulmonary effort is normal. No accessory muscle usage or respiratory distress.     Breath sounds: Normal breath sounds.  Abdominal:     General: Bowel sounds are normal. There is no distension.  Palpations: Abdomen is soft.     Tenderness: There is no abdominal tenderness. There is no guarding.  Genitourinary:    Comments: No cva tenderness. Musculoskeletal:     Cervical back: Normal range of motion and neck supple. No rigidity.     Comments: Mild bilateral foot/ankle edema bilaterally. No calf swelling or tenderness.   Skin:    General: Skin is warm and dry.     Findings: No rash.  Neurological:     Mental Status: He is alert.     Comments: Alert, speech clear.   Psychiatric:        Mood and Affect: Mood normal.     ED Results / Procedures / Treatments   Labs (all labs ordered are listed, but only abnormal results are displayed) Results for orders placed or performed during the hospital encounter of 08/13/20  Comprehensive metabolic panel  Result Value Ref Range   Sodium 137 135 - 145 mmol/L   Potassium 4.1 3.5 - 5.1 mmol/L   Chloride 104 98 - 111 mmol/L   CO2 23 22 - 32 mmol/L   Glucose, Bld 206 (H) 70 - 99 mg/dL   BUN 76 (H) 8 - 23 mg/dL   Creatinine, Ser 6.11 (H) 0.61 - 1.24 mg/dL   Calcium 8.0 (L) 8.9 - 10.3 mg/dL   Total Protein 6.9 6.5 - 8.1 g/dL   Albumin 3.6 3.5 - 5.0 g/dL   AST 18 15 - 41 U/L   ALT 16 0 - 44 U/L   Alkaline Phosphatase 39 38 - 126 U/L   Total Bilirubin 0.6 0.3 - 1.2 mg/dL   GFR, Estimated 9 (L) >60 mL/min   Anion gap 10 5 - 15  CBC  Result Value Ref Range   WBC 10.3 4.0 - 10.5 K/uL   RBC 2.68 (L)  4.22 - 5.81 MIL/uL   Hemoglobin 8.3 (L) 13.0 - 17.0 g/dL   HCT 25.4 (L) 39.0 - 52.0 %   MCV 94.8 80.0 - 100.0 fL   MCH 31.0 26.0 - 34.0 pg   MCHC 32.7 30.0 - 36.0 g/dL   RDW 13.1 11.5 - 15.5 %   Platelets 257 150 - 400 K/uL   nRBC 0.0 0.0 - 0.2 %  D-dimer, quantitative  Result Value Ref Range   D-Dimer, Quant 1.34 (H) 0.00 - 0.50 ug/mL-FEU  Brain natriuretic peptide  Result Value Ref Range   B Natriuretic Peptide 501.0 (H) 0.0 - 100.0 pg/mL  Troponin I (High Sensitivity)  Result Value Ref Range   Troponin I (High Sensitivity) 5 <18 ng/L   DG Chest 2 View  Result Date: 08/13/2020 CLINICAL DATA:  Short of breath and cough since last night. EXAM: CHEST - 2 VIEW COMPARISON:  06/20/2020. FINDINGS: Cardiac silhouette is normal in size. No mediastinal or hilar masses. No adenopathy. Subtle opacity noted at the posterolateral right lung base. This is likely atelectasis, with infection possible. Remainder of the lungs is clear. No pleural effusion or pneumothorax. Skeletal structures are intact. IMPRESSION: 1. Mild opacity posterolateral right lung base suspected to be atelectasis with pneumonia possible. No other evidence of acute cardiopulmonary disease. Electronically Signed   By: Lajean Manes M.D.   On: 08/13/2020 16:05    EKG EKG Interpretation  Date/Time:  Tuesday Aug 13 2020 15:35:54 EDT Ventricular Rate:  80 PR Interval:  382 QRS Duration: 82 QT Interval:  376 QTC Calculation: 433 R Axis:   88 Text Interpretation: Sinus rhythm with 1st degree A-V block Non-specific ST-t  changes Confirmed by Lajean Saver [phone]) on 08/13/2020 3:53:34 PM   Radiology DG Chest 2 View  Result Date: 08/13/2020 CLINICAL DATA:  Short of breath and cough since last night. EXAM: CHEST - 2 VIEW COMPARISON:  06/20/2020. FINDINGS: Cardiac silhouette is normal in size. No mediastinal or hilar masses. No adenopathy. Subtle opacity noted at the posterolateral right lung base. This is likely atelectasis, with  infection possible. Remainder of the lungs is clear. No pleural effusion or pneumothorax. Skeletal structures are intact. IMPRESSION: 1. Mild opacity posterolateral right lung base suspected to be atelectasis with pneumonia possible. No other evidence of acute cardiopulmonary disease. Electronically Signed   By: Lajean Manes M.D.   On: 08/13/2020 16:05    Procedures Procedures   Medications Ordered in ED Medications - No data to display  ED Course  I have reviewed the triage vital signs and the nursing notes.  Pertinent labs & imaging results that were available during my care of the patient were reviewed by me and considered in my medical decision making (see chart for details).    MDM Rules/Calculators/A&P                         Iv ns. Labs sent. cxr ordered.  Pt states he also wants to make sure to 'blood clot' - ddimer added (note, no chest pain or pleuritic pain/symptoms)  Reviewed nursing notes and prior charts for additional history.   Xrays reviewed/interpreted by me - no definite pna ?atelectasis (no fever, pt denies cough, no fever or chills, chest cta)  Labs reviewed/interpreted by me -  Trop normal (after constant symptoms for past 1-2 days) - felt not c/w acs.   Pt with worsening/severe ckd, k is normal. Pt also with worsening chronic anemia - will check hemoccult (may be due to renal insuff).   ddimer is elevated. No pleuritic pain/symptoms - unable to get ct due to renal fxn  - vq scan ordered. Discussed w pt that we can call in tech tonight and get study done this evening.   Discussed labs with patient and plan for rectal exam (re worsening anemia, r/o occult gi bleeding) - pt indicates stools are normal, brown, and he refuses rectal exam - discussed that at times blood loss is occult, not obvious, pt continues to refuse rectal.   Discussed given CKD not able to get CT PE study, and that we have ordered VQ.  Pt initially indicates if we can get now he will stay/wait, but if  cant do here/now, he wants to leave. He refuses admission/transfer.    Radiology indicates they have called in tech - rn to place iv.  Discussed w pt - pt indicates he wants to go home now, does not want to stay for test tonight or be admitted. He indicates further since re-starting his lasix, he now has ambulated about ED and feels breathing at baseline. He also indicates he has f/u with nephrology already arranged this coming week to discuss dialysis. Discussed concern re possible PE, need to stay and get test done, etc, risk associated w PE, etc.  After risks discussed, pt voices understanding but not willing to stay any longer, requests d/c/ama - indicates he will return in AM.   Pt leaving AMA. Return precautions/instructions provided.      Final Clinical Impression(s) / ED Diagnoses Final diagnoses:  None    Rx / DC Orders ED Discharge Orders    None  Lajean Saver, MD 08/13/20 [phone]

## 2020-08-13 NOTE — ED Triage Notes (Signed)
C/o shortness of breath onset last night

## 2020-08-13 NOTE — ED Notes (Signed)
This RN went to put IV in patient for VQ scan. Pt told this RN that pt was told by provider that he would be doing this outpatient in the morning. This RN spoke with provider who clarified that if VQ scan could be done tonight, that is preferred. This RN went to speak to pt again and pt stated that he would rather leave and have it done in the AM. Pt also stated that he took extra lasix last night and this morning, so therefore does not feel as short of breathe and previously stated. Stated that he wants to see how he feels tonight and if he is still short of breathe, he will come back in AM for VQ scan. Pt signed AMA form. EDP notified and is to speak with pt.

## 2020-08-13 NOTE — Discharge Instructions (Addendum)
It was our pleasure to provide your ER care today - we hope that you feel better.  Return in AM for VQ scan (or sooner if reconsider and want to stay for it to be done tonight).   Follow up with your kidney doctor as planned next week (for rising kidney function tests). Continue your blood pressure medication, limit salt intake, continue lasix - your blood pressure is high today, discuss with your doctor at follow up. As we discussed, your blood count is lower than previous (hemoglobin 8.3) - also discuss with your doctor at follow up.   Return to ER immediately if worse, new symptoms, fevers, chest pain, increased trouble breathing, or other concern.

## 2020-08-13 NOTE — ED Notes (Signed)
Pt reports he takes BP meds TID; had 0800 dose but has missed his 1300 dose and med will be due again at 2000

## 2020-08-15 DIAGNOSIS — E1122 Type 2 diabetes mellitus with diabetic chronic kidney disease: Secondary | ICD-10-CM | POA: Diagnosis not present

## 2020-08-15 DIAGNOSIS — E1129 Type 2 diabetes mellitus with other diabetic kidney complication: Secondary | ICD-10-CM | POA: Diagnosis not present

## 2020-08-15 DIAGNOSIS — N189 Chronic kidney disease, unspecified: Secondary | ICD-10-CM | POA: Diagnosis not present

## 2020-08-15 DIAGNOSIS — R808 Other proteinuria: Secondary | ICD-10-CM | POA: Diagnosis not present

## 2020-08-15 DIAGNOSIS — I160 Hypertensive urgency: Secondary | ICD-10-CM | POA: Diagnosis not present

## 2020-08-15 DIAGNOSIS — R809 Proteinuria, unspecified: Secondary | ICD-10-CM | POA: Diagnosis not present

## 2020-08-15 DIAGNOSIS — D631 Anemia in chronic kidney disease: Secondary | ICD-10-CM | POA: Diagnosis not present

## 2020-08-15 DIAGNOSIS — N185 Chronic kidney disease, stage 5: Secondary | ICD-10-CM | POA: Diagnosis not present

## 2020-08-27 ENCOUNTER — Encounter: Payer: Self-pay | Admitting: Infectious Diseases

## 2020-08-27 ENCOUNTER — Other Ambulatory Visit: Payer: Self-pay

## 2020-08-27 ENCOUNTER — Ambulatory Visit (INDEPENDENT_AMBULATORY_CARE_PROVIDER_SITE_OTHER): Payer: Medicare HMO | Admitting: Infectious Diseases

## 2020-08-27 VITALS — BP 183/74 | HR 65 | Resp 16 | Ht 70.5 in | Wt 243.0 lb

## 2020-08-27 DIAGNOSIS — Z5181 Encounter for therapeutic drug level monitoring: Secondary | ICD-10-CM | POA: Diagnosis not present

## 2020-08-27 DIAGNOSIS — Z227 Latent tuberculosis: Secondary | ICD-10-CM | POA: Diagnosis not present

## 2020-08-27 DIAGNOSIS — Z7185 Encounter for immunization safety counseling: Secondary | ICD-10-CM | POA: Diagnosis not present

## 2020-08-27 NOTE — Progress Notes (Addendum)
[facility]                                      [address], [place], [place], [address]                                               Phn. [phone]; Fax: [phone]                                                               Date: 08/27/20  Reason for Visit: Follow up on latent TB    HPI: Trevor [name] is a 80 y.o.old male with PMH of DM, CKD with plans for HD, Kidney stones, HLD, HTN referred for evaluation and treatment of positive quantiferon.   Patient denies ever being told to have positive quantiferon before this recent test on 06/27/20. Denies ever being treated for it.  Denies cough more than 2 weeks, night sweats, SOB, loss of appetite and loss of weight.  Occasional on and of SOB+. Denies nausea, vomiting, abdominal pain, diarrhea, GU symptoms. Denies joint pain/swelling or GU symptoms   Risk factors: Grandfather had ? Tuberculosis ( he is not sure) Born in [place], Lived in [place] 2 years, stayed most of his life in [place] Traveled [place], 25 years ago. 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 Tx started with Isoniazid and pyridoxine on 4/14. Has been taking it daily together without missing any doses. Denies any side effects like nausea, vomiting, abdominal pain, diarrhea. Denies any jaundice. Denies any numbness, tremors. He is concerned about his hydralazine making him feel like drunk. He has an upcoming appt with Nephrologist and is going to discuss with them. He was seen in the ED on 5/3 for SOB for possible fluid overload and was sent home with fu with Nephrology  ROS: Denies yellowish discoloration of sclera and skin, abdominal pain/distension, hematemesis.             Denies fever, chills, nightsweats, nausea, vomiting, diarrhea, constipation, weight loss, recent hospitalizations, rashes, joint complaints, shortness of breath, chest pain, headaches, dysuria .  Current Outpatient Medications on File Prior to Visit  Medication Sig Dispense Refill  . Accu-Chek Softclix Lancets lancets     . Alcohol Swabs (B-D SINGLE USE SWABS REGULAR) PADS     . aspirin EC 81 MG tablet Take 81 mg by mouth daily.    . Blood Glucose Monitoring Suppl (ONETOUCH VERIO) w/Device KIT 1 each by Does not apply route as needed. 1 kit 0  . carvedilol (COREG) 3.125 MG tablet Take 6.25 mg by mouth 2 (two) times daily with a meal.     . Cholecalciferol 25 MCG (1000 UT) tablet Take 1,000 Units by mouth daily.     . cloNIDine (CATAPRES) 0.2 MG tablet Take 0.2 mg by mouth 3 (  three) times daily.     . Garlic 9767 MG CAPS Take 1,000 mg by mouth daily.    Marland Kitchen glucose blood (ONETOUCH VERIO) test strip Use as instructed 300 each 2  . hydrALAZINE (APRESOLINE) 25 MG tablet Take 2 tablets by mouth 3 (three) times daily.    Marland Kitchen HYDROcodone-acetaminophen (NORCO/VICODIN) 5-325 MG tablet Take 1 tablet by mouth every 4 (four) hours as needed. 6 tablet 0  . isoniazid (NYDRAZID) 300 MG tablet Take 1 tablet (300 mg total) by mouth daily. 30 tablet 3  . lidocaine (LIDODERM) 5 % Place 1 patch onto the skin daily. Remove & Discard patch within 12 hours or as directed by MD 30 patch 0  . OVER THE COUNTER MEDICATION Take 1 tablet by mouth daily. Kidney Cleanser    . pyridOXINE (VITAMIN B-6) 50 MG tablet Take 1 tablet (50 mg total) by mouth daily. 30 tablet 3  . rosuvastatin (CRESTOR) 10 MG tablet Take 10 mg by mouth daily.     . sodium bicarbonate 650 MG tablet Take 650 mg by mouth 3 (three) times daily.     . TRESIBA FLEXTOUCH 200 UNIT/ML SOPN Inject 10 Units into the skin at bedtime.    . furosemide (LASIX) 40 MG tablet Take 40 mg by mouth 2 (two) times daily as needed.     No current facility-administered  medications on file prior to visit.     No Known Allergies  Past Medical History:  Diagnosis Date  . Anemia   . Chronic kidney disease    Stage 4?   . Diabetes mellitus without complication ([place])   . Hepatitis    not sure what type - over 20 years ago  . History of kidney stones   . Hyperlipidemia   . Hypertension    Past Surgical History:  Procedure Laterality Date  . AV FISTULA PLACEMENT Left 08/09/2019   Procedure: LEFT ARM Basilic  ARTERIOVENOUS  FISTULA CREATION;  Surgeon: Waynetta Sandy, MD;  Location: [place];  Service: Vascular;  Laterality: Left;  . BASCILIC VEIN TRANSPOSITION Left 11/30/2019   Procedure: LEFT SECOND STAGE BASCILIC VEIN TRANSPOSITION;  Surgeon: Waynetta Sandy, MD;  Location: [facility];  Service: Vascular;  Laterality: Left;  . COLONOSCOPY N/A 07/07/2017   Procedure: COLONOSCOPY;  Surgeon: Danie Binder, MD;  [facility];  Service: Endoscopy;  Laterality: N/A;  8:30   Social History   Socioeconomic History  . Marital status: Married    Spouse name: Not on file  . Number of children: Not on file  . Years of education: Not on file  . Highest education level: Not on file  Occupational History  . Not on file  Tobacco Use  . Smoking status: Never Smoker  . Smokeless tobacco: Never Used  Vaping Use  . Vaping Use: Never used  Substance and Sexual Activity  . Alcohol use: Never  . Drug use: Never  . Sexual activity: Not on file  Other Topics Concern  . Not on file  Social History Narrative  . Not on file   Social Determinants of Health   Financial Resource Strain: Not on file  Food Insecurity: Not on file  Transportation Needs: Not on file  Physical Activity: Not on file  Stress: Not on file  Social Connections: Not on file  Intimate Partner Violence: Not on file   Vitals  BP (!) 183/74   Pulse 65   Resp 16   Ht 5' 10.5" (1.791 m)  Wt 243 lb (110.2 kg)   SpO2 99%   BMI 34.37 kg/m   Gen: Alert and  oriented x 3, no acute distress, OBESITY  HEENT: [place]/AT, PERL, no scleral icterus, no pale conjunctivae, hearing normal, oral mucosa moist Neck: Supple, no lymphadenopathy Cardio: Regular rate and rhythm; +S1 and S2; no murmurs, gallops, or rubs Resp: CTAB; no wheezes, rhonchi, or rales GI: Soft, nontender, nondistended, bowel sounds present GU: Musc: Extremities: No cyanosis, clubbing, or edema; +2 PT and DP pulses Skin: No rashes, lesions, or ecchymoses Neuro: Grossly non focal  Psych: Calm, cooperative   Laboratory  06/27/20 HepaB surface ag negative Hep B core IgM negative Hep B surface ab 19, immune Hep C virus ab <0.1 Quantiferon TB gold positive    Pertinent Imaging Chest xray 06/21/20 FINDINGS: Lung volumes are normal. No consolidative airspace disease. No pleural effusions. No pneumothorax. No pulmonary nodule or mass noted. Pulmonary vasculature and the cardiomediastinal silhouette are within normal limits. Atherosclerotic calcifications in the thoracic aorta.  IMPRESSION: 1.  No radiographic evidence of acute cardiopulmonary disease. 2. Aortic atherosclerosis.  Assessment/Plan: Problem List Items Addressed This Visit      Other   Latent tuberculosis - Primary   Relevant Orders   Comprehensive metabolic panel   Medication monitoring encounter   Immunization counseling     Positive Quantiferon/latent TB  Started on Isoniazid and Pyridoxine on 07/25/20 Plan to continue for 6-9 months CMP today  Fu in 1 month for video visit  CKD/HLD/HTN Per PCP and Nephrology   Immunization Counseling Says has had 2 doses of COVID vaccine and scheduled to get booster next week k  Patient's labs were reviewed as well as his previous records. Patients questions were addressed and answered.   I spent greater than 60 minutes for this patient encounter including greater than 50% of time in face to face counsel of the patient and in coordination of their  care.  Electronically signed by:  Rosiland Oz, MD Infectious Diseases  Office phone [phone] Fax no. [phone]

## 2020-08-28 LAB — COMPREHENSIVE METABOLIC PANEL
AG Ratio: 1.4 (calc) (ref 1.0–2.5)
ALT: 14 U/L (ref 9–46)
AST: 13 U/L (ref 10–35)
Albumin: 3.5 g/dL — ABNORMAL LOW (ref 3.6–5.1)
Alkaline phosphatase (APISO): 50 U/L (ref 35–144)
BUN/Creatinine Ratio: 12 (calc) (ref 6–22)
BUN: 87 mg/dL — ABNORMAL HIGH (ref 7–25)
CO2: 20 mmol/L (ref 20–32)
Calcium: 8.1 mg/dL — ABNORMAL LOW (ref 8.6–10.3)
Chloride: 104 mmol/L (ref 98–110)
Creat: 7.3 mg/dL — ABNORMAL HIGH (ref 0.70–1.18)
Globulin: 2.5 g/dL (calc) (ref 1.9–3.7)
Glucose, Bld: 214 mg/dL — ABNORMAL HIGH (ref 65–99)
Potassium: 4.4 mmol/L (ref 3.5–5.3)
Sodium: 137 mmol/L (ref 135–146)
Total Bilirubin: 0.3 mg/dL (ref 0.2–1.2)
Total Protein: 6 g/dL — ABNORMAL LOW (ref 6.1–8.1)

## 2020-09-04 DIAGNOSIS — D631 Anemia in chronic kidney disease: Secondary | ICD-10-CM | POA: Diagnosis not present

## 2020-09-04 DIAGNOSIS — R809 Proteinuria, unspecified: Secondary | ICD-10-CM | POA: Diagnosis not present

## 2020-09-04 DIAGNOSIS — N189 Chronic kidney disease, unspecified: Secondary | ICD-10-CM | POA: Diagnosis not present

## 2020-09-04 DIAGNOSIS — E1129 Type 2 diabetes mellitus with other diabetic kidney complication: Secondary | ICD-10-CM | POA: Diagnosis not present

## 2020-09-04 DIAGNOSIS — I160 Hypertensive urgency: Secondary | ICD-10-CM | POA: Diagnosis not present

## 2020-09-04 DIAGNOSIS — N185 Chronic kidney disease, stage 5: Secondary | ICD-10-CM | POA: Diagnosis not present

## 2020-09-04 DIAGNOSIS — E1122 Type 2 diabetes mellitus with diabetic chronic kidney disease: Secondary | ICD-10-CM | POA: Diagnosis not present

## 2020-09-04 DIAGNOSIS — R808 Other proteinuria: Secondary | ICD-10-CM | POA: Diagnosis not present

## 2020-09-05 ENCOUNTER — Other Ambulatory Visit: Payer: Self-pay | Admitting: Radiology

## 2020-09-05 ENCOUNTER — Other Ambulatory Visit (HOSPITAL_COMMUNITY): Payer: Self-pay | Admitting: Nephrology

## 2020-09-05 DIAGNOSIS — N189 Chronic kidney disease, unspecified: Secondary | ICD-10-CM | POA: Diagnosis not present

## 2020-09-05 DIAGNOSIS — N289 Disorder of kidney and ureter, unspecified: Secondary | ICD-10-CM

## 2020-09-05 DIAGNOSIS — D631 Anemia in chronic kidney disease: Secondary | ICD-10-CM | POA: Diagnosis not present

## 2020-09-05 DIAGNOSIS — N185 Chronic kidney disease, stage 5: Secondary | ICD-10-CM | POA: Diagnosis not present

## 2020-09-05 DIAGNOSIS — R809 Proteinuria, unspecified: Secondary | ICD-10-CM | POA: Diagnosis not present

## 2020-09-05 DIAGNOSIS — T82590A Other mechanical complication of surgically created arteriovenous fistula, initial encounter: Secondary | ICD-10-CM | POA: Diagnosis not present

## 2020-09-05 DIAGNOSIS — R808 Other proteinuria: Secondary | ICD-10-CM | POA: Diagnosis not present

## 2020-09-05 DIAGNOSIS — E1129 Type 2 diabetes mellitus with other diabetic kidney complication: Secondary | ICD-10-CM | POA: Diagnosis not present

## 2020-09-05 DIAGNOSIS — E1122 Type 2 diabetes mellitus with diabetic chronic kidney disease: Secondary | ICD-10-CM | POA: Diagnosis not present

## 2020-09-05 DIAGNOSIS — E211 Secondary hyperparathyroidism, not elsewhere classified: Secondary | ICD-10-CM | POA: Diagnosis not present

## 2020-09-06 ENCOUNTER — Emergency Department (HOSPITAL_COMMUNITY): Payer: Medicare HMO

## 2020-09-06 ENCOUNTER — Encounter (HOSPITAL_COMMUNITY): Payer: Self-pay

## 2020-09-06 ENCOUNTER — Encounter (HOSPITAL_COMMUNITY): Payer: Self-pay | Admitting: *Deleted

## 2020-09-06 ENCOUNTER — Ambulatory Visit (HOSPITAL_COMMUNITY)
Admission: RE | Admit: 2020-09-06 | Discharge: 2020-09-06 | Disposition: A | Discharge status: Home / Self Care | Payer: Medicare HMO | Source: Ambulatory Visit | Attending: Nephrology | Admitting: Nephrology

## 2020-09-06 ENCOUNTER — Other Ambulatory Visit: Payer: Self-pay

## 2020-09-06 ENCOUNTER — Other Ambulatory Visit (HOSPITAL_COMMUNITY): Payer: Self-pay | Admitting: Nephrology

## 2020-09-06 ENCOUNTER — Emergency Department (HOSPITAL_COMMUNITY)
Admission: EM | Admit: 2020-09-06 | Discharge: 2020-09-06 | Disposition: A | Discharge status: Home / Self Care | Payer: Medicare HMO | Attending: Emergency Medicine | Admitting: Emergency Medicine

## 2020-09-06 ENCOUNTER — Emergency Department (HOSPITAL_COMMUNITY)
Admission: EM | Admit: 2020-09-06 | Discharge: 2020-09-06 | Disposition: A | Discharge status: Home / Self Care | Payer: Medicare HMO | Source: Home / Self Care | Attending: Emergency Medicine | Admitting: Emergency Medicine

## 2020-09-06 DIAGNOSIS — Z79899 Other long term (current) drug therapy: Secondary | ICD-10-CM | POA: Insufficient documentation

## 2020-09-06 DIAGNOSIS — E162 Hypoglycemia, unspecified: Secondary | ICD-10-CM

## 2020-09-06 DIAGNOSIS — N185 Chronic kidney disease, stage 5: Secondary | ICD-10-CM | POA: Insufficient documentation

## 2020-09-06 DIAGNOSIS — Z794 Long term (current) use of insulin: Secondary | ICD-10-CM | POA: Insufficient documentation

## 2020-09-06 DIAGNOSIS — R030 Elevated blood-pressure reading, without diagnosis of hypertension: Secondary | ICD-10-CM | POA: Diagnosis not present

## 2020-09-06 DIAGNOSIS — R58 Hemorrhage, not elsewhere classified: Secondary | ICD-10-CM

## 2020-09-06 DIAGNOSIS — Z452 Encounter for adjustment and management of vascular access device: Secondary | ICD-10-CM | POA: Insufficient documentation

## 2020-09-06 DIAGNOSIS — S59901A Unspecified injury of right elbow, initial encounter: Secondary | ICD-10-CM | POA: Diagnosis present

## 2020-09-06 DIAGNOSIS — Y92810 Car as the place of occurrence of the external cause: Secondary | ICD-10-CM | POA: Insufficient documentation

## 2020-09-06 DIAGNOSIS — W010XXA Fall on same level from slipping, tripping and stumbling without subsequent striking against object, initial encounter: Secondary | ICD-10-CM | POA: Insufficient documentation

## 2020-09-06 DIAGNOSIS — M25561 Pain in right knee: Secondary | ICD-10-CM | POA: Insufficient documentation

## 2020-09-06 DIAGNOSIS — E1122 Type 2 diabetes mellitus with diabetic chronic kidney disease: Secondary | ICD-10-CM | POA: Insufficient documentation

## 2020-09-06 DIAGNOSIS — E11649 Type 2 diabetes mellitus with hypoglycemia without coma: Secondary | ICD-10-CM | POA: Diagnosis not present

## 2020-09-06 DIAGNOSIS — S50311A Abrasion of right elbow, initial encounter: Secondary | ICD-10-CM | POA: Insufficient documentation

## 2020-09-06 DIAGNOSIS — S80211A Abrasion, right knee, initial encounter: Secondary | ICD-10-CM | POA: Diagnosis not present

## 2020-09-06 DIAGNOSIS — T82838A Hemorrhage of vascular prosthetic devices, implants and grafts, initial encounter: Secondary | ICD-10-CM | POA: Insufficient documentation

## 2020-09-06 DIAGNOSIS — Z4901 Encounter for fitting and adjustment of extracorporeal dialysis catheter: Secondary | ICD-10-CM | POA: Diagnosis not present

## 2020-09-06 DIAGNOSIS — Z7982 Long term (current) use of aspirin: Secondary | ICD-10-CM | POA: Insufficient documentation

## 2020-09-06 DIAGNOSIS — I120 Hypertensive chronic kidney disease with stage 5 chronic kidney disease or end stage renal disease: Secondary | ICD-10-CM | POA: Insufficient documentation

## 2020-09-06 DIAGNOSIS — I10 Essential (primary) hypertension: Secondary | ICD-10-CM | POA: Diagnosis not present

## 2020-09-06 DIAGNOSIS — N186 End stage renal disease: Secondary | ICD-10-CM | POA: Diagnosis not present

## 2020-09-06 DIAGNOSIS — N289 Disorder of kidney and ureter, unspecified: Secondary | ICD-10-CM

## 2020-09-06 DIAGNOSIS — W19XXXA Unspecified fall, initial encounter: Secondary | ICD-10-CM | POA: Insufficient documentation

## 2020-09-06 DIAGNOSIS — M25421 Effusion, right elbow: Secondary | ICD-10-CM | POA: Diagnosis not present

## 2020-09-06 DIAGNOSIS — M7989 Other specified soft tissue disorders: Secondary | ICD-10-CM | POA: Diagnosis not present

## 2020-09-06 DIAGNOSIS — M7121 Synovial cyst of popliteal space [Baker], right knee: Secondary | ICD-10-CM | POA: Diagnosis not present

## 2020-09-06 DIAGNOSIS — M1711 Unilateral primary osteoarthritis, right knee: Secondary | ICD-10-CM | POA: Diagnosis not present

## 2020-09-06 HISTORY — PX: IR US GUIDE VASC ACCESS RIGHT: IMG2390

## 2020-09-06 HISTORY — PX: IR FLUORO GUIDE CV LINE RIGHT: IMG2283

## 2020-09-06 LAB — URINALYSIS, ROUTINE W REFLEX MICROSCOPIC
Bacteria, UA: NONE SEEN
Bilirubin Urine: NEGATIVE
Glucose, UA: NEGATIVE mg/dL
Hgb urine dipstick: NEGATIVE
Ketones, ur: NEGATIVE mg/dL
Nitrite: NEGATIVE
Protein, ur: >=300 mg/dL — AB
Specific Gravity, Urine: 1.011 (ref 1.005–1.030)
pH: 7 (ref 5.0–8.0)

## 2020-09-06 LAB — BASIC METABOLIC PANEL
Anion gap: 12 (ref 5–15)
BUN: 83 mg/dL — ABNORMAL HIGH (ref 8–23)
CO2: 20 mmol/L — ABNORMAL LOW (ref 22–32)
Calcium: 8.4 mg/dL — ABNORMAL LOW (ref 8.9–10.3)
Chloride: 105 mmol/L (ref 98–111)
Creatinine, Ser: 6.84 mg/dL — ABNORMAL HIGH (ref 0.61–1.24)
GFR, Estimated: 8 mL/min — ABNORMAL LOW (ref >60)
Glucose, Bld: 70 mg/dL (ref 70–99)
Potassium: 4.3 mmol/L (ref 3.5–5.1)
Sodium: 137 mmol/L (ref 135–145)

## 2020-09-06 LAB — CBC
HCT: 26.2 % — ABNORMAL LOW (ref 39.0–52.0)
HCT: 28.2 % — ABNORMAL LOW (ref 39.0–52.0)
Hemoglobin: 8.5 g/dL — ABNORMAL LOW (ref 13.0–17.0)
Hemoglobin: 9.6 g/dL — ABNORMAL LOW (ref 13.0–17.0)
MCH: 30.2 pg (ref 26.0–34.0)
MCH: 31 pg (ref 26.0–34.0)
MCHC: 32.4 g/dL (ref 30.0–36.0)
MCHC: 34 g/dL (ref 30.0–36.0)
MCV: 93.2 fL (ref 80.0–100.0)
MCV: 91 fL (ref 80.0–100.0)
Platelets: 257 10*3/uL (ref 150–400)
Platelets: 251 10*3/uL (ref 150–400)
RBC: 2.81 MIL/uL — ABNORMAL LOW (ref 4.22–5.81)
RBC: 3.1 MIL/uL — ABNORMAL LOW (ref 4.22–5.81)
RDW: 12.6 % (ref 11.5–15.5)
RDW: 12.6 % (ref 11.5–15.5)
WBC: 11.7 10*3/uL — ABNORMAL HIGH (ref 4.0–10.5)
WBC: 17 10*3/uL — ABNORMAL HIGH (ref 4.0–10.5)
nRBC: 0 % (ref 0.0–0.2)
nRBC: 0 % (ref 0.0–0.2)

## 2020-09-06 LAB — CBG MONITORING, ED
Glucose-Capillary: 66 mg/dL — ABNORMAL LOW (ref 70–99)
Glucose-Capillary: 56 mg/dL — ABNORMAL LOW (ref 70–99)
Glucose-Capillary: 178 mg/dL — ABNORMAL HIGH (ref 70–99)

## 2020-09-06 LAB — GLUCOSE, CAPILLARY: Glucose-Capillary: 107 mg/dL — ABNORMAL HIGH (ref 70–99)

## 2020-09-06 IMAGING — US IR FLUORO GUIDE CV LINE*R*
2 series · 3 of 3 positions shown · non-contrast
Comparison: none

INDICATION: 80-year-old male with history of end-stage renal disease requiring
central venous access for hemodialysis.

[Series 1: single · 2 of 2 slices shown]
[im 1/2]
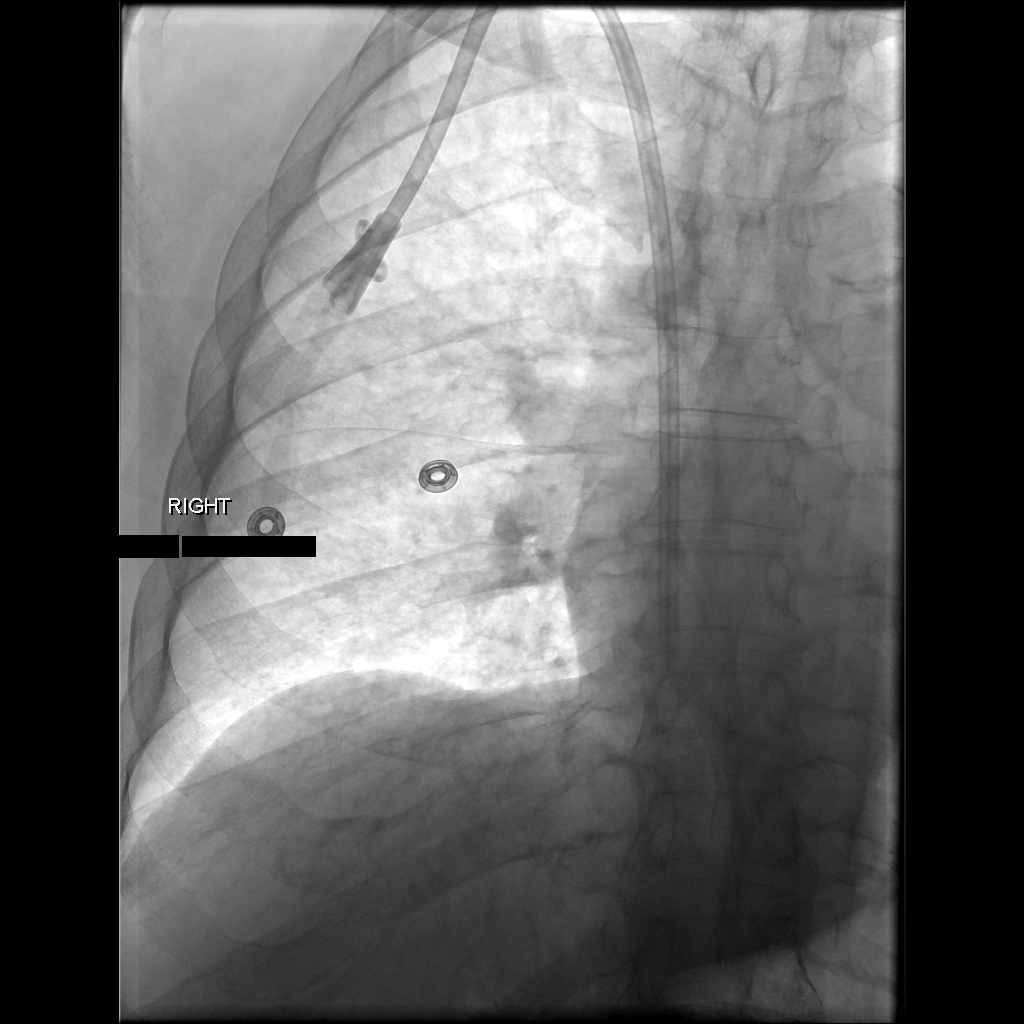
[im 2/2]
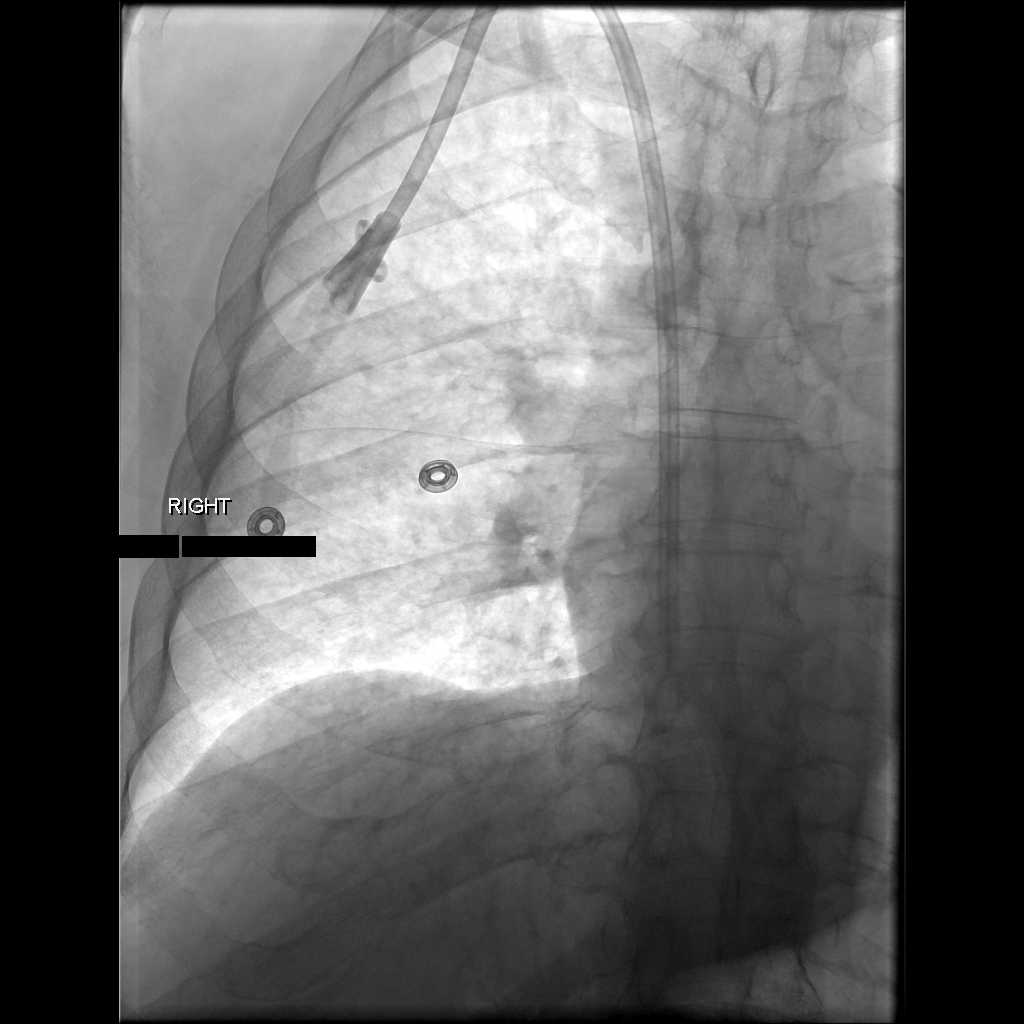

[Series 1: ir fluoro guide cv line*right* · 1 of 1 slices shown]
[im 1/1]
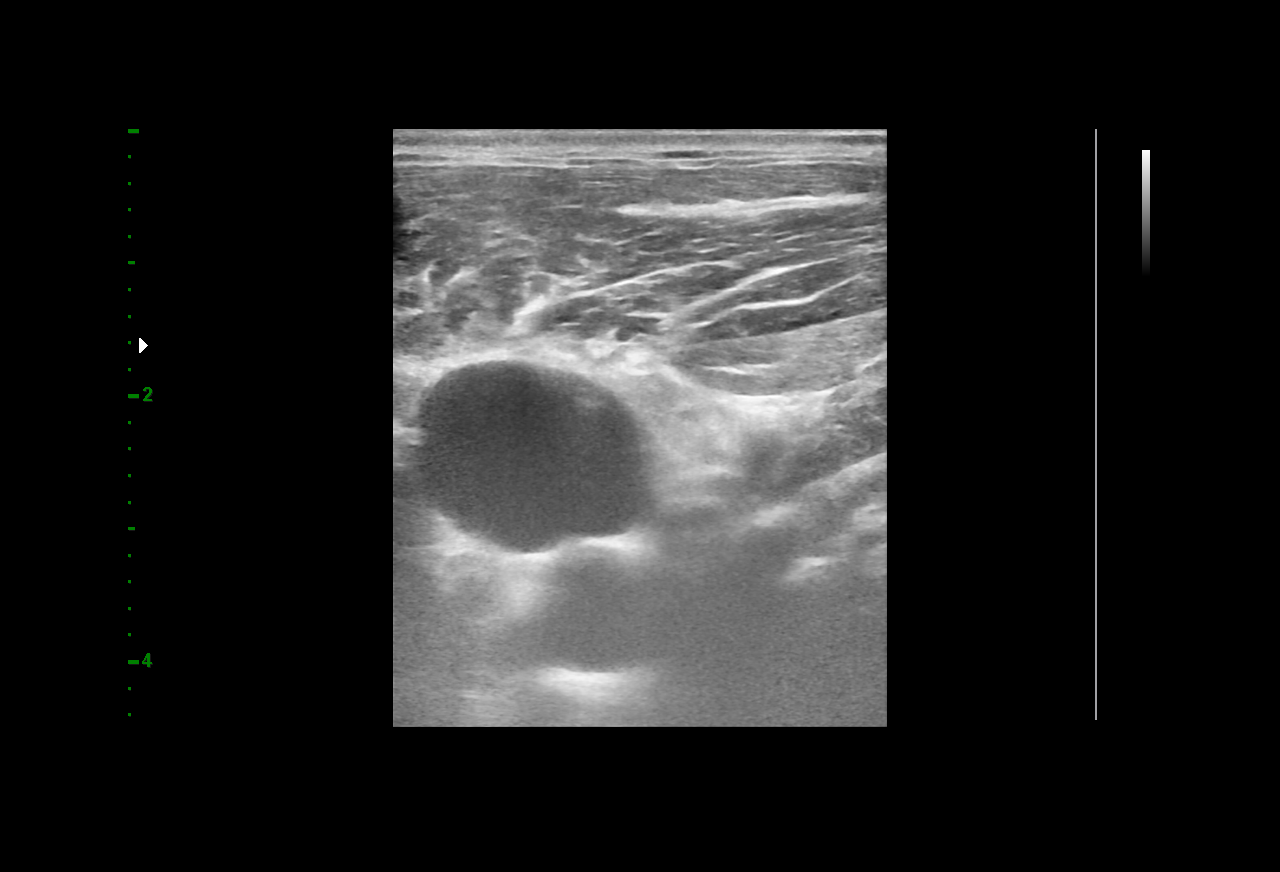

[3 of 3 positions shown; findings below may reference images not displayed]

EXAM:
TUNNELED CENTRAL VENOUS HEMODIALYSIS CATHETER PLACEMENT WITH
ULTRASOUND AND FLUOROSCOPIC GUIDANCE

MEDICATIONS:
2 g Ancef, intravenous, administered in appropriate window for
prophylaxis prior to procedure start

ANESTHESIA/SEDATION:
Moderate (conscious) sedation was employed during this procedure. A
total of Versed 1 mg and Fentanyl 25 mcg was administered
intravenously.

Moderate Sedation Time: 16 minutes. The patient's level of
consciousness and vital signs were monitored continuously by
radiology nursing throughout the procedure under my direct
supervision.

FLUOROSCOPY TIME:  0.1 minutes (3 mGy).

COMPLICATIONS:
None immediate.



After creating a small venotomy incision, a 21 gauge micropuncture
kit was utilized to access the internal jugular vein. Real-time
ultrasound guidance was utilized for vascular access including the
acquisition of a permanent ultrasound image documenting patency of
the accessed vessel.

A Rosen wire was advanced to the level of the IVC and the
micropuncture sheath was exchanged for an 8 Fr dilator. A
French tunneled hemodialysis catheter measuring 23 cm from tip to
cuff was tunneled in a retrograde fashion from the anterior chest
wall to the venotomy incision. Serial dilation was then performed an
a peel-away sheath was placed.

The catheter was then placed through the peel-away sheath with the
catheter tip ultimately positioned within the right atrium. Final
catheter positioning was confirmed and documented with a spot
radiographic image. The catheter aspirates and flushes normally. The
catheter was flushed with appropriate volume heparin dwells.

The catheter exit site was secured with a 0-Silk retention suture.
The venotomy incision was closed with Dermabond. Sterile dressings
were applied. The patient tolerated the procedure well without
immediate post procedural complication.
IMPRESSION: Successful placement of 23 cm tip to cuff tunneled hemodialysis
catheter via the right internal jugular vein with catheter tip
terminating within the right atrium. The catheter is ready for
immediate use.

## 2020-09-06 IMAGING — CT CT KNEE*R* W/O CM
2 series · 10 of 14 positions shown, 12 images · non-contrast
Comparison: Right knee radiograph [DATE]

CLINICAL DATA: Knee pain, stress fracture suspected, neg xray

EXAM:
CT OF THE right KNEE WITHOUT CONTRAST
TECHNIQUE: Multidetector CT imaging of the right knee was performed according
to the standard protocol. Multiplanar CT image reconstructions were
also generated.

[Series 3: right knee 3.0 mpr ax · axial · 0.32mm/px · z∈[+461,+608]mm · 5 of 75 slices shown, 7 images (1 of 2)]
[im 13/75  soft-tissue]
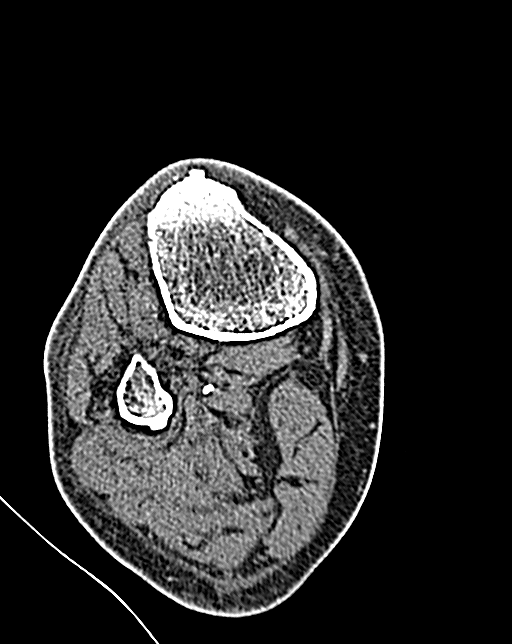
[im 13/75  bone]
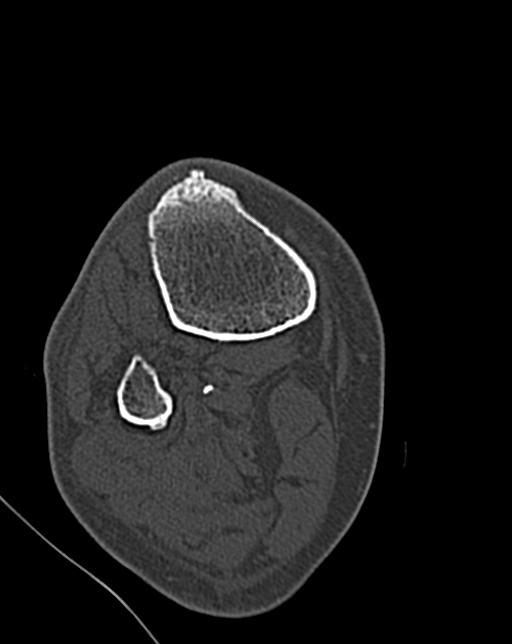
[im 25/75  bone]
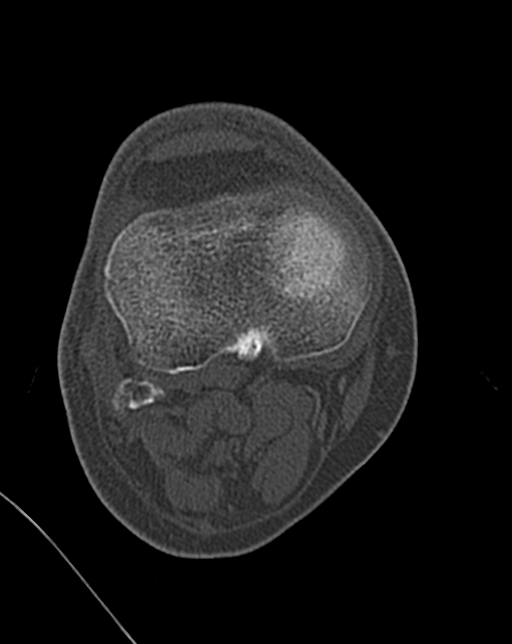
[im 38/75  bone]
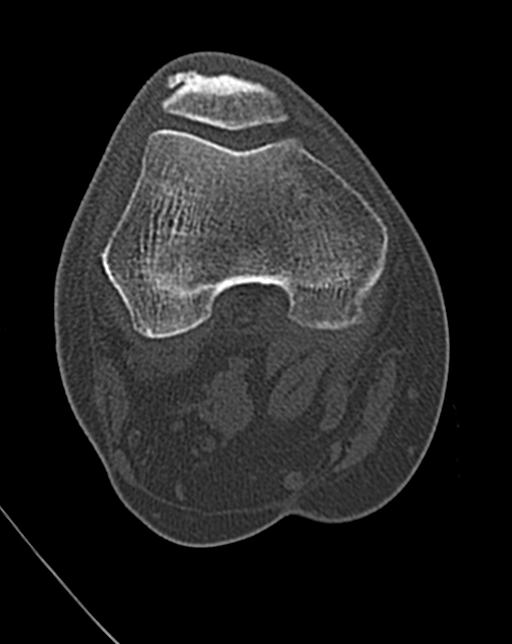
[im 50/75  bone]
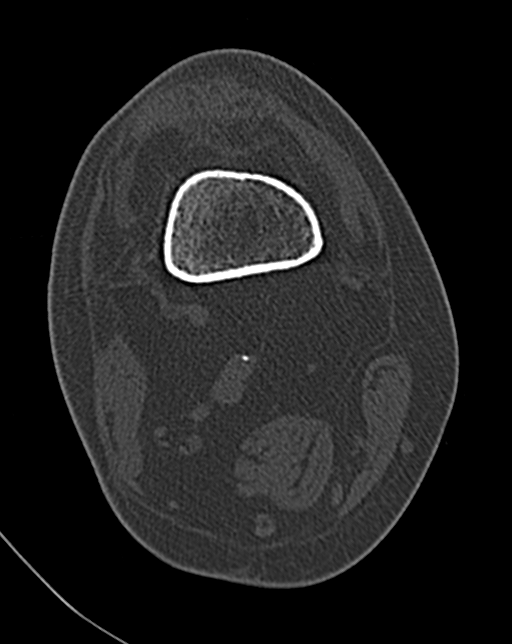
[im 62/75  soft-tissue]
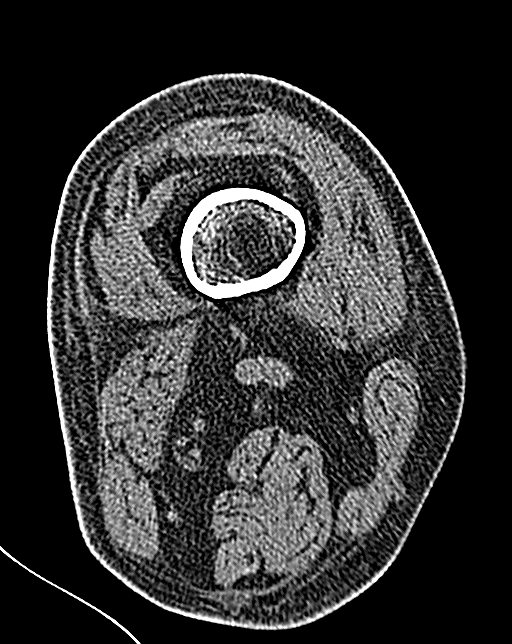
[im 62/75  bone]
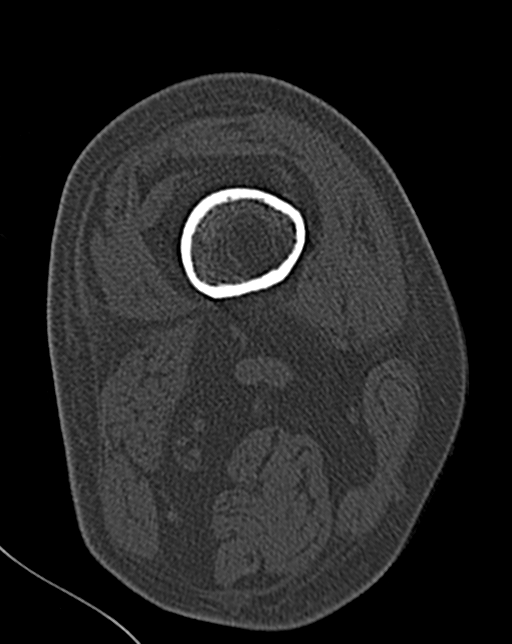

[Series 4: right knee 3.0 mpr ax · axial · 0.35mm/px · z∈[+459,+608]mm · 5 of 76 slices shown (2 of 2)]
[im 13/76  bone]
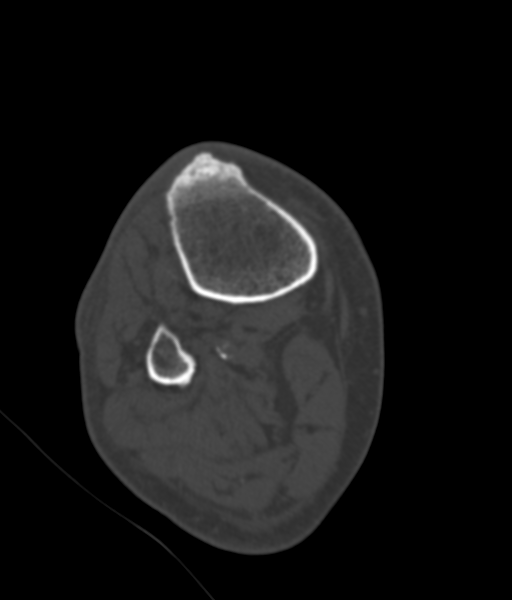
[im 26/76  bone]
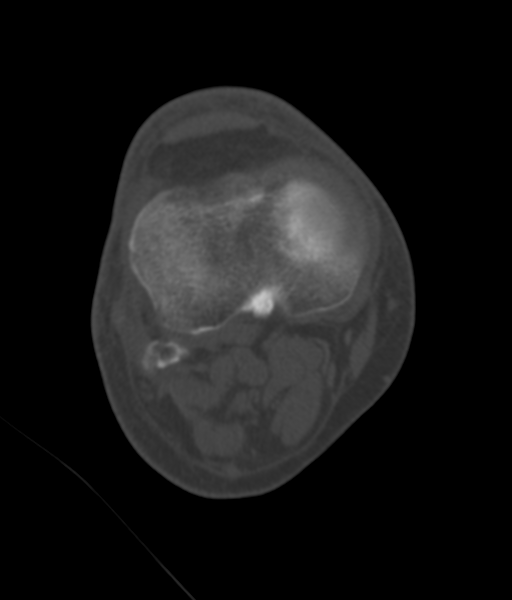
[im 38/76  bone]
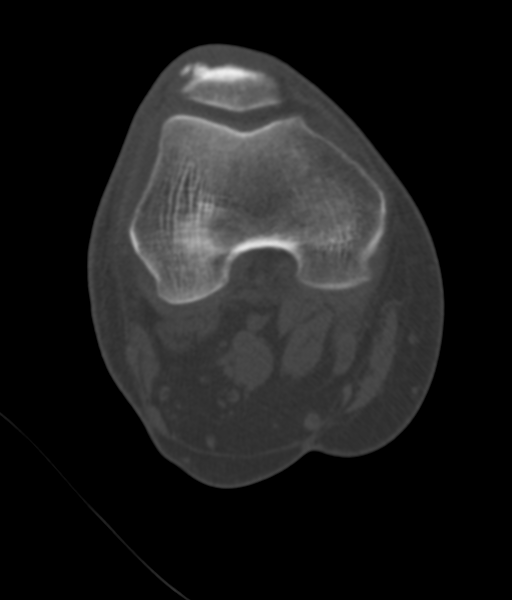
[im 51/76  bone]
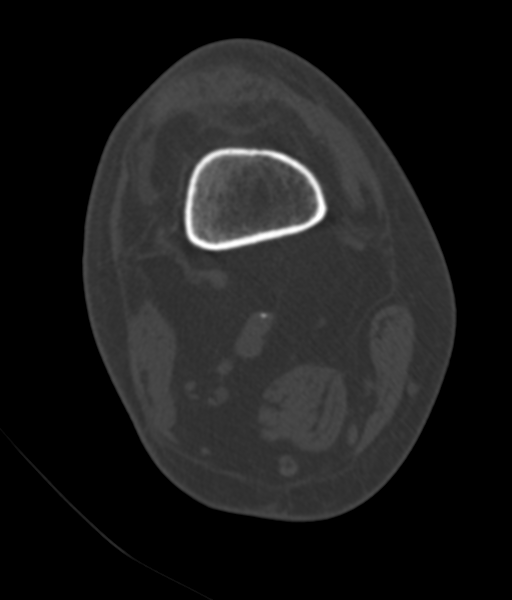
[im 63/76  bone]
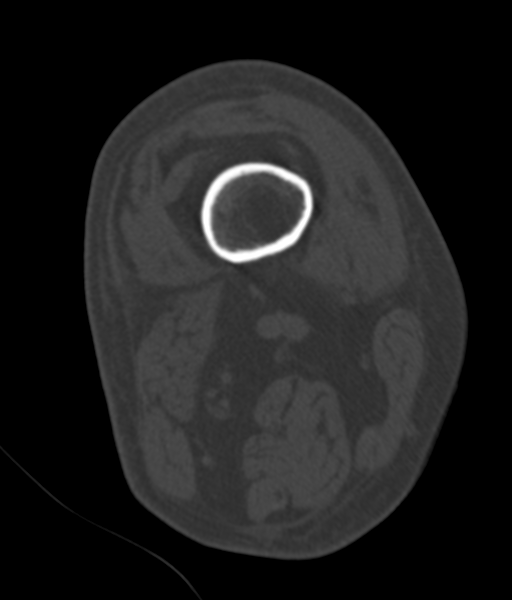

[10 of 14 positions shown; findings below may reference images not displayed]

FINDINGS: Bones/Joint/Cartilage

There is no evidence of acute fracture. There is mild patellofemoral
compartment degenerative change. Supra and infrapatellar
enthesophytes.

Ligaments

Suboptimally assessed by CT.

Muscles and Tendons

There is no significant muscle atrophy.

Soft tissues

Mild superficial soft tissue swelling along the knee. There is no
significant joint effusion. Vascular calcifications. Small Baker
cyst.
IMPRESSION: No evidence of acute fracture.

Mild soft tissue swelling.  Small Baker cyst.

## 2020-09-06 IMAGING — CR DG ELBOW COMPLETE 3+V*R*
4 series · 4 of 4 positions shown · non-contrast
Comparison: None.

CLINICAL DATA: Fall

EXAM:
RIGHT ELBOW - COMPLETE 3+ VIEW

[elbow ap]
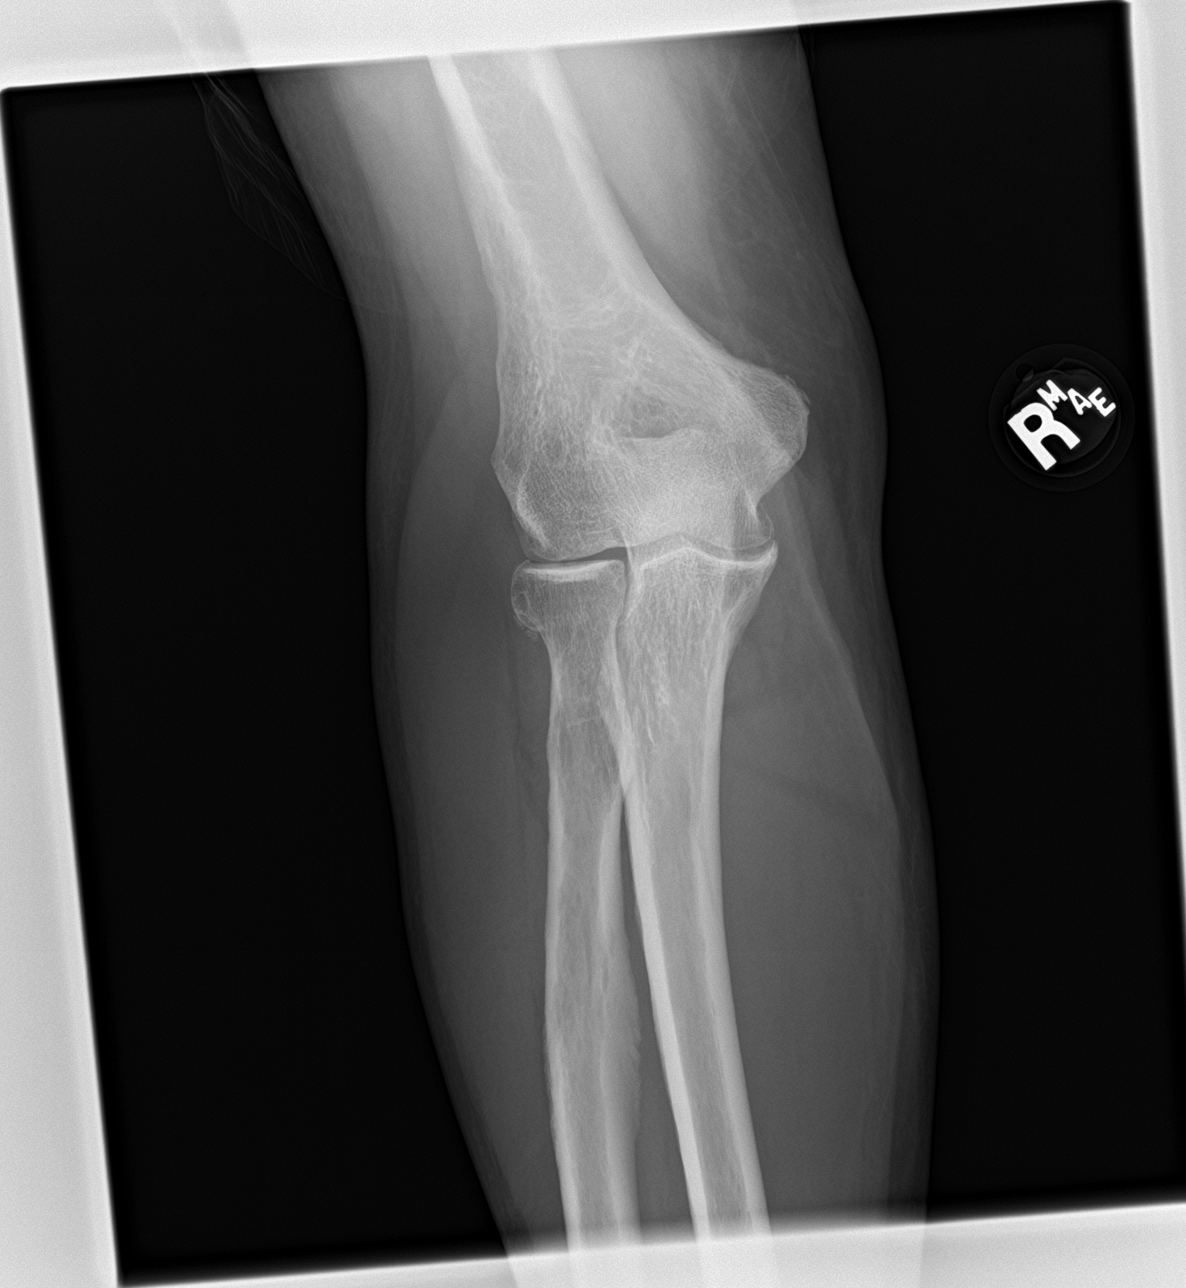

[elbow obl (1 of 2)]
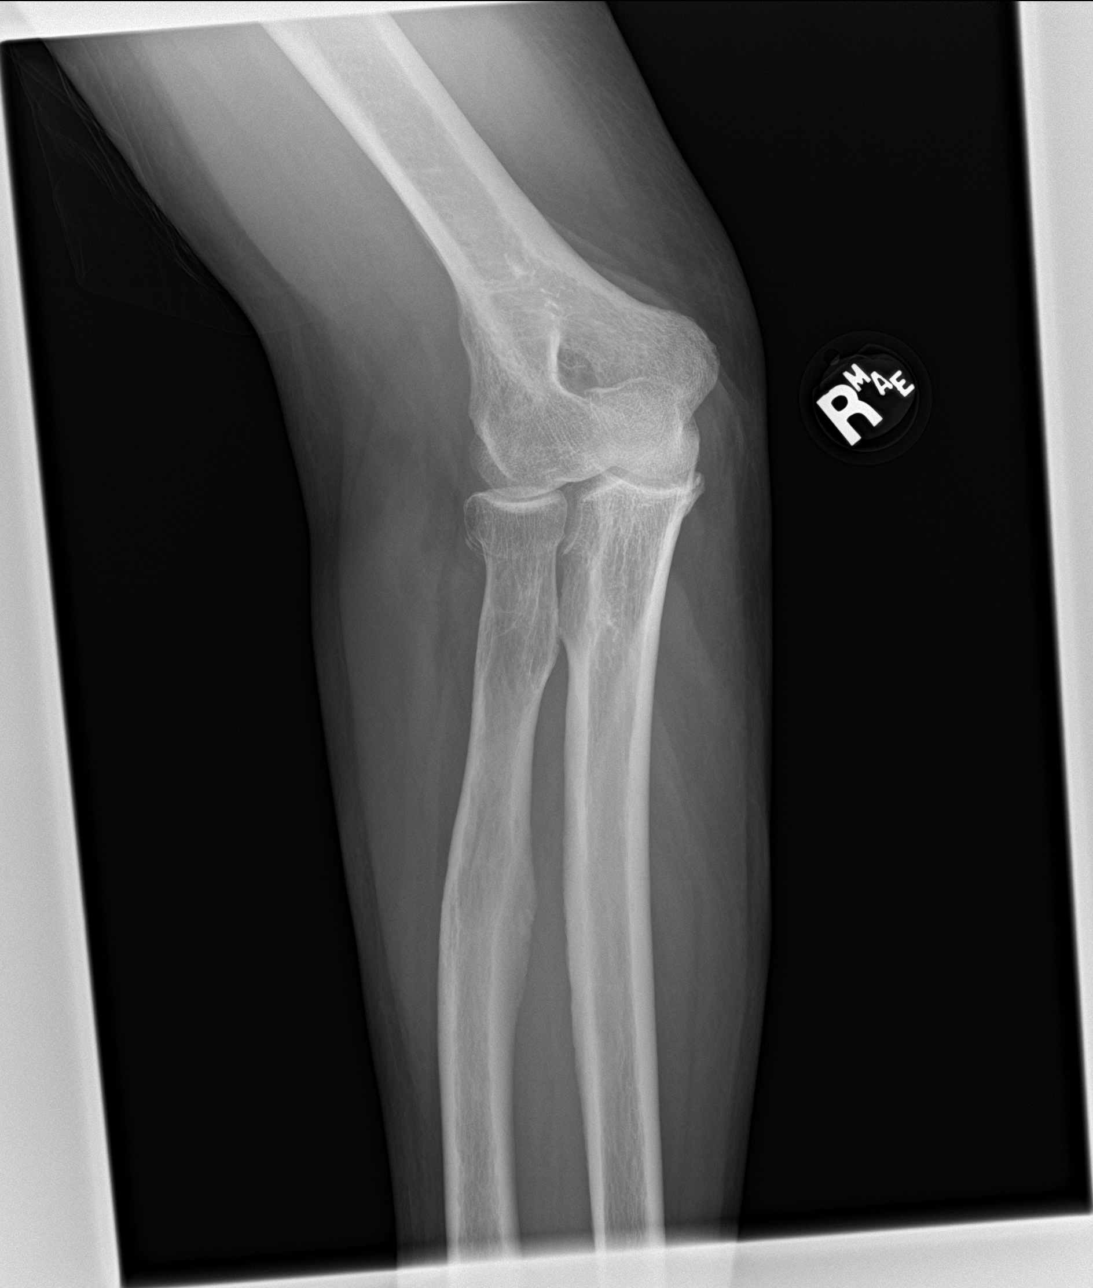

[elbow obl (2 of 2)]
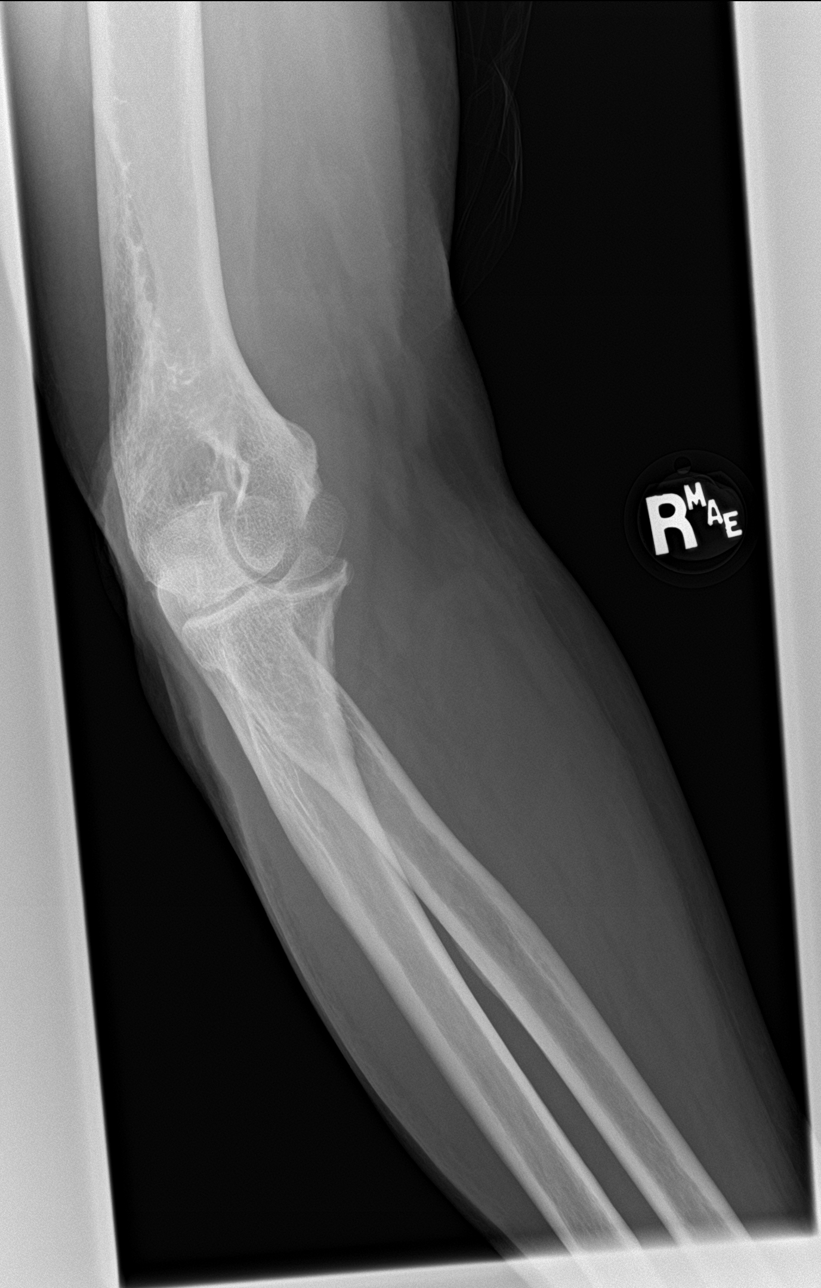

[elbow lat]
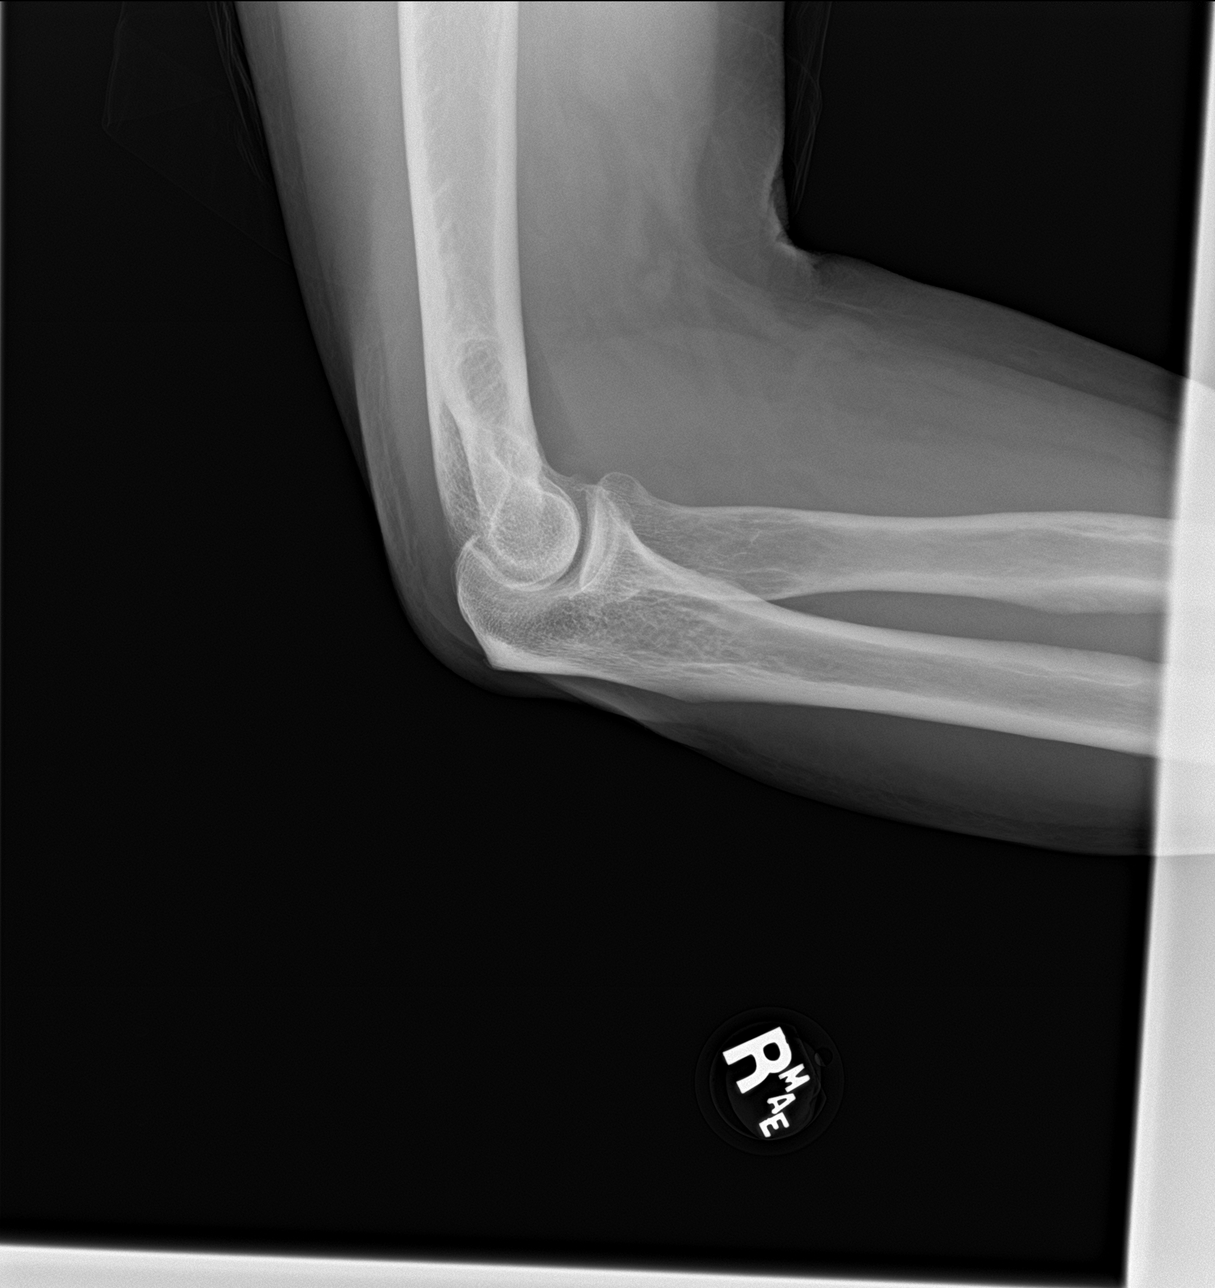

[4 of 4 positions shown; findings below may reference images not displayed]

FINDINGS: Small joint effusion. Chondrocalcinosis in the ulnar collateral
ligament. Mineralization along the medial humeral condyle, sequela
of prior epicondylitis. Soft tissue swelling over the posterior
aspect of the elbow.
IMPRESSION: Small joint effusion and posterior soft tissue swelling. No acute
fracture or dislocation.

## 2020-09-06 MED ORDER — LIDOCAINE-EPINEPHRINE 2 %-1:100000 IJ SOLN
INTRAMUSCULAR | Status: AC | PRN
  Administered 2020-09-06: 30 mL

## 2020-09-06 MED ORDER — DEXTROSE 50 % IV SOLN
1.0000 | Freq: Once | INTRAVENOUS | Status: AC
  Administered 2020-09-06: 50 mL via INTRAVENOUS
  Filled 2020-09-06: qty 50

## 2020-09-06 MED ORDER — LIDOCAINE-EPINEPHRINE 1 %-1:100000 IJ SOLN
INTRAMUSCULAR | Status: AC
  Filled 2020-09-06: qty 1

## 2020-09-06 MED ORDER — GELATIN ABSORBABLE 12-7 MM EX MISC
1.0000 | Freq: Once | CUTANEOUS | Status: AC
  Administered 2020-09-06: 1 via TOPICAL
  Filled 2020-09-06: qty 1

## 2020-09-06 MED ORDER — CLONIDINE HCL 0.2 MG PO TABS
0.2000 mg | ORAL_TABLET | Freq: Once | ORAL | Status: AC
  Administered 2020-09-06: 0.2 mg via ORAL
  Filled 2020-09-06: qty 1

## 2020-09-06 MED ORDER — OXYCODONE HCL 5 MG PO TABS
5.0000 mg | ORAL_TABLET | Freq: Once | ORAL | Status: AC
  Administered 2020-09-06: 5 mg via ORAL
  Filled 2020-09-06: qty 1

## 2020-09-06 MED ORDER — FENTANYL CITRATE (PF) 100 MCG/2ML IJ SOLN
INTRAMUSCULAR | Status: AC | PRN
  Administered 2020-09-06: 25 ug via INTRAVENOUS

## 2020-09-06 MED ORDER — CEFAZOLIN SODIUM-DEXTROSE 2-4 GM/100ML-% IV SOLN
2.0000 g | INTRAVENOUS | Status: AC
  Administered 2020-09-06: 2 g via INTRAVENOUS

## 2020-09-06 MED ORDER — MIDAZOLAM HCL 2 MG/2ML IJ SOLN
INTRAMUSCULAR | Status: AC | PRN
  Administered 2020-09-06 (×2): 0.5 mg via INTRAVENOUS

## 2020-09-06 MED ORDER — THROMBIN FOR PERCUTANEOUS TREATMENT OF PSEUDOANEURYSM (5000UNITS/10ML)
Freq: Once | PERCUTANEOUS | Status: AC
  Filled 2020-09-06 (×5): qty 1

## 2020-09-06 MED ORDER — MIDAZOLAM HCL 2 MG/2ML IJ SOLN
INTRAMUSCULAR | Status: AC
  Filled 2020-09-06: qty 2

## 2020-09-06 MED ORDER — HEPARIN SODIUM (PORCINE) 1000 UNIT/ML IJ SOLN
INTRAMUSCULAR | Status: AC
  Filled 2020-09-06: qty 1

## 2020-09-06 MED ORDER — OXYCODONE-ACETAMINOPHEN 5-325 MG PO TABS
1.0000 | ORAL_TABLET | Freq: Once | ORAL | Status: AC
Start: 2020-09-06 — End: 2020-09-06
  Administered 2020-09-06: 1 via ORAL
  Filled 2020-09-06: qty 1

## 2020-09-06 MED ORDER — CEFAZOLIN SODIUM-DEXTROSE 2-4 GM/100ML-% IV SOLN
INTRAVENOUS | Status: AC
  Filled 2020-09-06: qty 100

## 2020-09-06 MED ORDER — CLONIDINE HCL 0.1 MG PO TABS
0.1000 mg | ORAL_TABLET | Freq: Once | ORAL | Status: AC
  Administered 2020-09-06: 0.1 mg via ORAL
  Filled 2020-09-06: qty 1

## 2020-09-06 MED ORDER — LIDOCAINE-EPINEPHRINE (PF) 2 %-1:200000 IJ SOLN
10.0000 mL | Freq: Once | INTRAMUSCULAR | Status: AC
  Administered 2020-09-06: 10 mL via INTRADERMAL
  Filled 2020-09-06: qty 20

## 2020-09-06 MED ORDER — OXYCODONE-ACETAMINOPHEN 5-325 MG PO TABS: 1.0000 | ORAL_TABLET | Freq: Three times a day (TID) | ORAL | 0 refills | Status: DC | PRN

## 2020-09-06 MED ORDER — SODIUM CHLORIDE 0.9 % IV SOLN: INTRAVENOUS | Status: DC

## 2020-09-06 MED ORDER — FENTANYL CITRATE (PF) 100 MCG/2ML IJ SOLN
INTRAMUSCULAR | Status: AC
  Filled 2020-09-06: qty 2

## 2020-09-06 NOTE — ED Triage Notes (Signed)
The pt was here earlier  Today  He fell in the hallway and now has severe pain in his rt knee

## 2020-09-06 NOTE — H&P (Signed)
Chief Complaint: Patient was seen in consultation today for HD catheter placement at the request of Stryker S  Referring Physician(s): Bhutani,Manpreet S  Supervising Physician: Ruthann Cancer  Patient Status: [facility] - Out-pt  History of Present Illness: Trevor [name] is a 80 y.o. male with ESRD in need of HD. He had a left UE AVF created but he states it never worked. He is referred for tunneled HD cath placement. PMHx, meds, labs, imaging, allergies reviewed. Has been NPO today as directed.   Past Medical History:  Diagnosis Date  . Anemia   . Chronic kidney disease    Stage 4?   . Diabetes mellitus without complication ([place])   . Hepatitis    not sure what type - over 20 years ago  . History of kidney stones   . Hyperlipidemia   . Hypertension     Past Surgical History:  Procedure Laterality Date  . AV FISTULA PLACEMENT Left 08/09/2019   Procedure: LEFT ARM Basilic  ARTERIOVENOUS  FISTULA CREATION;  Surgeon: Waynetta Sandy, MD;  Location: [facility];  Service: Vascular;  Laterality: Left;  . BASCILIC VEIN TRANSPOSITION Left 11/30/2019   Procedure: LEFT SECOND STAGE BASCILIC VEIN TRANSPOSITION;  Surgeon: Waynetta Sandy, MD;  Location: [facility];  Service: Vascular;  Laterality: Left;  . COLONOSCOPY N/A 07/07/2017   Procedure: COLONOSCOPY;  Surgeon: Danie Binder, MD;  [facility];  Service: Endoscopy;  Laterality: N/A;  8:30    Allergies: Patient has no known allergies.  Medications: Prior to Admission medications   Medication Sig Start Date End Date Taking? Authorizing Provider  Accu-Chek Softclix Lancets lancets  09/13/19   [provider]  Alcohol Swabs (B-D SINGLE USE SWABS REGULAR) PADS  08/11/19   [provider]  aspirin EC 81 MG tablet Take 81 mg by mouth daily.    [provider]  Blood Glucose Monitoring Suppl (ONETOUCH VERIO) w/Device KIT 1 each by Does not apply route as needed. 05/03/20    Cassandria Anger, MD  carvedilol (COREG) 3.125 MG tablet Take 6.25 mg by mouth 2 (two) times daily with a meal.  10/18/19 10/17/20  [provider]  Cholecalciferol 25 MCG (1000 UT) tablet Take 1,000 Units by mouth daily.  12/23/09   [provider]  cloNIDine (CATAPRES) 0.2 MG tablet Take 0.2 mg by mouth 3 (three) times daily.  04/12/17   [provider]  furosemide (LASIX) 40 MG tablet Take 40 mg by mouth 2 (two) times daily as needed. 07/14/19 11/20/19  [provider]  Garlic 8416 MG CAPS Take 1,000 mg by mouth daily.    [provider]  glucose blood (ONETOUCH VERIO) test strip Use as instructed 05/03/20   Cassandria Anger, MD  hydrALAZINE (APRESOLINE) 25 MG tablet Take 2 tablets by mouth 3 (three) times daily. 07/03/20   [provider]  HYDROcodone-acetaminophen (NORCO/VICODIN) 5-325 MG tablet Take 1 tablet by mouth every 4 (four) hours as needed. 04/30/20   Henderly, Britni A, PA-C  isoniazid (NYDRAZID) 300 MG tablet Take 1 tablet (300 mg total) by mouth daily. 07/25/20   Rosiland Oz, MD  lidocaine (LIDODERM) 5 % Place 1 patch onto the skin daily. Remove & Discard patch within 12 hours or as directed by MD 04/30/20   Henderly, Britni A, PA-C  OVER THE COUNTER MEDICATION Take 1 tablet by mouth daily. Kidney Cleanser    [provider]  pyridOXINE (VITAMIN B-6) 50 MG tablet Take 1 tablet (  50 mg total) by mouth daily. 07/25/20   Rosiland Oz, MD  rosuvastatin (CRESTOR) 10 MG tablet Take 10 mg by mouth daily.  01/26/17   [provider]  sodium bicarbonate 650 MG tablet Take 650 mg by mouth 3 (three) times daily.  07/24/19   [provider]  TRESIBA FLEXTOUCH 200 UNIT/ML SOPN Inject 10 Units into the skin at bedtime. 04/15/17   [provider]     Family History  Problem Relation Age of Onset  . Diabetes Mother   . Diabetes Brother   . Heart attack Brother   . Colon cancer Son   . Lung cancer Son      Social History   Socioeconomic History  . Marital status: Married    Spouse name: Not on file  . Number of children: Not on file  . Years of education: Not on file  . Highest education level: Not on file  Occupational History  . Not on file  Tobacco Use  . Smoking status: Never Smoker  . Smokeless tobacco: Never Used  Vaping Use  . Vaping Use: Never used  Substance and Sexual Activity  . Alcohol use: Never  . Drug use: Never  . Sexual activity: Not on file  Other Topics Concern  . Not on file  Social History Narrative  . Not on file   Social Determinants of Health   Financial Resource Strain: Not on file  Food Insecurity: Not on file  Transportation Needs: Not on file  Physical Activity: Not on file  Stress: Not on file  Social Connections: Not on file    Review of Systems: A 12 point ROS discussed and pertinent positives are indicated in the HPI above.  All other systems are negative.  Review of Systems  Vital Signs: T: 98.8  BP: 155/103 HR: 83 RR: 16  Physical Exam Constitutional:      Appearance: Normal appearance.  HENT:     Mouth/Throat:     Mouth: Mucous membranes are moist.     Pharynx: Oropharynx is clear.  Cardiovascular:     Rate and Rhythm: Normal rate and regular rhythm.     Heart sounds: Normal heart sounds.  Pulmonary:     Effort: Pulmonary effort is normal. No respiratory distress.     Breath sounds: Normal breath sounds.  Skin:    General: Skin is warm and dry.  Neurological:     General: No focal deficit present.     Mental Status: He is alert and oriented to person, place, and time.  Psychiatric:        Mood and Affect: Mood normal.        Thought Content: Thought content normal.        Judgment: Judgment normal.     Imaging: DG Chest 2 View  Result Date: 08/13/2020 CLINICAL DATA:  Short of breath and cough since last night. EXAM: CHEST - 2 VIEW COMPARISON:  06/20/2020. FINDINGS: Cardiac silhouette is normal in size. No  mediastinal or hilar masses. No adenopathy. Subtle opacity noted at the posterolateral right lung base. This is likely atelectasis, with infection possible. Remainder of the lungs is clear. No pleural effusion or pneumothorax. Skeletal structures are intact. IMPRESSION: 1. Mild opacity posterolateral right lung base suspected to be atelectasis with pneumonia possible. No other evidence of acute cardiopulmonary disease. Electronically Signed   By: Lajean Manes M.D.   On: 08/13/2020 16:05   DG Elbow Complete Right  Result Date: 09/06/2020 CLINICAL DATA:  Fall EXAM: RIGHT ELBOW - COMPLETE 3+ VIEW COMPARISON:  None. FINDINGS: Small joint effusion. Chondrocalcinosis in the ulnar collateral ligament. Mineralization along the medial humeral condyle, sequela of prior epicondylitis. Soft tissue swelling over the posterior aspect of the elbow. IMPRESSION: Small joint effusion and posterior soft tissue swelling. No acute fracture or dislocation. Electronically Signed   By: Dahlia Bailiff MD   On: 09/06/2020 11:17   DG Knee Complete 4 Views Right  Result Date: 09/06/2020 CLINICAL DATA:  Knee pain after fall EXAM: RIGHT KNEE - COMPLETE 4+ VIEW COMPARISON:  None. FINDINGS: No evidence of fracture, dislocation, or joint effusion. No evidence of significant arthropathy. Superior patellar enthesophyte. Vascular calcifications. IMPRESSION: No acute osseous abnormality. Electronically Signed   By: Dahlia Bailiff MD   On: 09/06/2020 11:18    Labs:  CBC: Recent Labs    11/30/19 0619 08/13/20 1648 09/06/20 1003  WBC  --  10.3 11.7*  HGB 10.9* 8.3* 8.5*  HCT 32.0* 25.4* 26.2*  PLT  --  257 257    COAGS: No results for input(s): INR, APTT in the last 8760 hours.  BMP: Recent Labs    11/30/19 0619 02/15/20 0000 02/15/20 0000 04/30/20 0000 08/13/20 1648 08/27/20 0000 09/06/20 1003  NA 141  --   --   --  137 137 137  K 3.9  --   --   --  4.1 4.4 4.3  CL 104  --   --   --  104 104 105  CO2  --   --    --   --  23 20 20*  GLUCOSE 120*  --   --   --  206* 214* 70  BUN 60* 51*   < > 60* 76* 87* 83*  CALCIUM  --  8.7  --   --  8.0* 8.1* 8.4*  CREATININE 4.00* 5.0*   < > 5.0* 6.11* 7.30* 6.84*  GFRNONAA  --   --   --  10 9*  --  8*  GFRAA  --   --   --  12  --   --   --    < > = values in this interval not displayed.    LIVER FUNCTION TESTS: Recent Labs    02/15/20 0000 07/25/20 1416 08/13/20 1648 08/27/20 0000  BILITOT  --  0.4 0.6 0.3  AST  --  _0 ALT  --  _1 ALKPHOS  --   --  39  --   PROT  --  6.5 6.9 6.0*  ALBUMIN 3.4*  --  3.6  --     TUMOR MARKERS: No results for input(s): AFPTM, CEA, CA199, CHROMGRNA in the last 8760 hours.  Assessment and Plan: ESRD For tunneled HD catheter placement Labs reviewed. Risks and benefits of image guided tunneled dialysis catheter placement was discussed with the patient including, but not limited to bleeding, infection, pneumothorax, or fibrin sheath development and need for additional procedures.  All of the patient's questions were answered, patient is agreeable to proceed. Consent signed and in chart.    Thank you for this interesting consult.  I greatly enjoyed meeting XAI FRERKING and look forward to participating in their care.  A copy of this report was sent to the requesting provider on this date.  Electronically Signed: Ascencion Dike, PA-C 09/06/2020, 2:10 PM   I spent a total of 20 minutes in face to face in clinical consultation, greater than 50%  of which was counseling/coordinating care for tunneled HD catheter placement

## 2020-09-06 NOTE — ED Provider Notes (Signed)
While patient in the waiting room patient's dialysis line started to bleed profusely through dressing  Nursing notified patient will be moved to back for room   Josceline Chenard A, PA-C 09/06/20 1659    Breck Coons, MD 09/06/20 [phone]

## 2020-09-06 NOTE — Sedation Documentation (Signed)
Dr. Serafina Royals informed of blood pressure 207/87. No new orders given. MD states nephrology consulted on patient in ED and aware of blood pressure.

## 2020-09-06 NOTE — Sedation Documentation (Signed)
Patient at IR for HD catheter placement. On arrival to department patient complaining of leg pain sustained from fall this morning. Verbal order obtained from Dr. Serafina Royals for pain medication prior to procedure. Patient assisted to IR table with help from staff.

## 2020-09-06 NOTE — ED Provider Notes (Addendum)
Emergency Medicine Provider Triage Evaluation Note  Trevor [name] , [name] 80 y.o. male  was evaluated in triage.  Pt complains of right knee pain.  Had fall earlier today.  Was seen here in the emergency department.  X-ray shows soft tissue swelling however no acute fracture.  Currently patient was also found to be hypoglycemic as well as hypotensive.  Nephrology was consulted at that time who agreed with oral medication.  Patient CBG improved at discharge.  Patient did have an IR hemodialysis cath placed today.  Patient returns because he has continued pain and does not feel like he can go home.  Review of Systems  Positive: Right knee pain Negative: Headache, paresthesias, weakness  Physical Exam  BP (!) 156/97 (BP Location: Right Arm)   Pulse 93   Temp 98.5 F (36.9 C) (Oral)   Resp 16   SpO2 97%  Gen:   Awake, no distress   Resp:  Normal effort  MSK:   Diffuse tenderness to right knee, mild overlying soft tissue swelling Other:    Medical Decision Making  Medically screening exam initiated at 4:19 PM.  Appropriate orders placed.  Trevor [name] was informed that the remainder of the evaluation will be completed by another provider, this initial triage assessment does not replace that evaluation, and the importance of remaining in the ED until their evaluation is complete.  Knee pain, seen earlier today with reassuring xray imaging.  Patient feels like he cannot go home.  He feels like he is unable to ambulate due to pain in his right knee.  I reviewed prior imaging.  No fracture.  Will obtain CT to rule out occult fracture. No pain to hip, back     Trevor [name] A, PA-C 09/06/20 1623    Breck Coons, MD 09/06/20 [phone]

## 2020-09-06 NOTE — Progress Notes (Signed)
Pt stated his leg hurt (from fall earlier today) so bad he could not move it. Larene Beach PA paged to ask for pt options. Informed pt he could either call primary MD for pain management or go back to ER. Pt chose to go back to the ER. Pt and pt's wife escorted down to ER by NT.

## 2020-09-06 NOTE — ED Notes (Signed)
IR notified that pt now has IV access

## 2020-09-06 NOTE — ED Provider Notes (Signed)
[facility] EMERGENCY DEPARTMENT Provider Note   CSN: [id number] Arrival date & time: 09/06/20  0175     History Chief Complaint  Patient presents with  . Fall    Trevor [name] is a 80 y.o. male.  The history is provided by the patient and medical records. No language interpreter was used.  Fall This is a new problem. The current episode started less than 1 hour ago. The problem occurs rarely. The problem has been resolved. Pertinent negatives include no chest pain, no abdominal pain, no headaches and no shortness of breath. Nothing aggravates the symptoms. Nothing relieves the symptoms. He has tried nothing for the symptoms. The treatment provided no relief.       Past Medical History:  Diagnosis Date  . Anemia   . Chronic kidney disease    Stage 4?   . Diabetes mellitus without complication ([place])   . Hepatitis    not sure what type - over 20 years ago  . History of kidney stones   . Hyperlipidemia   . Hypertension     Patient Active Problem List   Diagnosis Date Noted  . 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 Medication monitoring encounter 08/27/2020  . Immunization counseling 08/27/2020  . TB lung, latent 07/25/2020  . Diabetes mellitus with stage 5 chronic kidney disease ([place]) 05/02/2020  . Thyroid nodule 05/02/2020  . Chronic kidney disease, stage IV (severe) ([place]) 05/16/2019  . Special screening for malignant neoplasms, colon     Past Surgical History:  Procedure Laterality Date  . AV FISTULA PLACEMENT Left 08/09/2019   Procedure: LEFT ARM Basilic  ARTERIOVENOUS  FISTULA CREATION;  Surgeon: Waynetta Sandy, MD;  Location: [place];  Service: Vascular;  Laterality: Left;  . BASCILIC VEIN TRANSPOSITION Left 11/30/2019   Procedure: LEFT SECOND STAGE BASCILIC VEIN TRANSPOSITION;  Surgeon: Waynetta Sandy, MD;  Location: [place];  Service: Vascular;  Laterality: Left;  . COLONOSCOPY N/A 07/07/2017   Procedure: COLONOSCOPY;  Surgeon:  Danie Binder, MD;  [facility];  Service: Endoscopy;  Laterality: N/A;  8:30       Family History  Problem Relation Age of Onset  . Diabetes Mother   . Diabetes Brother   . Heart attack Brother   . Colon cancer Son   . Lung cancer Son     Social History   Tobacco Use  . Smoking status: Never Smoker  . Smokeless tobacco: Never Used  Vaping Use  . Vaping Use: Never used  Substance Use Topics  . Alcohol use: Never  . Drug use: Never    Home Medications Prior to Admission medications   Medication Sig Start Date End Date Taking? Authorizing Provider  Accu-Chek Softclix Lancets lancets  09/13/19   [provider]  Alcohol Swabs (B-D SINGLE USE SWABS REGULAR) PADS  08/11/19   [provider]  aspirin EC 81 MG tablet Take 81 mg by mouth daily.    [provider]  Blood Glucose Monitoring Suppl (ONETOUCH VERIO) w/Device KIT 1 each by Does not apply route as needed. 05/03/20   Cassandria Anger, MD  carvedilol (COREG) 3.125 MG tablet Take 6.25 mg by mouth 2 (two) times daily with a meal.  10/18/19 10/17/20  [provider]  Cholecalciferol 25 MCG (1000 UT) tablet Take 1,000 Units by mouth daily.  12/23/09   [provider]  cloNIDine (CATAPRES) 0.2 MG tablet Take 0.2 mg by mouth 3 (three) times daily.  04/12/17  [provider]  furosemide (LASIX) 40 MG tablet Take 40 mg by mouth 2 (two) times daily as needed. 07/14/19 11/20/19  [provider]  Garlic 4782 MG CAPS Take 1,000 mg by mouth daily.    [provider]  glucose blood (ONETOUCH VERIO) test strip Use as instructed 05/03/20   Cassandria Anger, MD  hydrALAZINE (APRESOLINE) 25 MG tablet Take 2 tablets by mouth 3 (three) times daily. 07/03/20   [provider]  HYDROcodone-acetaminophen (NORCO/VICODIN) 5-325 MG tablet Take 1 tablet by mouth every 4 (four) hours as needed. 04/30/20   Henderly, Britni A, PA-C  isoniazid (NYDRAZID) 300 MG tablet  Take 1 tablet (300 mg total) by mouth daily. 07/25/20   Rosiland Oz, MD  lidocaine (LIDODERM) 5 % Place 1 patch onto the skin daily. Remove & Discard patch within 12 hours or as directed by MD 04/30/20   Henderly, Britni A, PA-C  OVER THE COUNTER MEDICATION Take 1 tablet by mouth daily. Kidney Cleanser    [provider]  pyridOXINE (VITAMIN B-6) 50 MG tablet Take 1 tablet (50 mg total) by mouth daily. 07/25/20   Rosiland Oz, MD  rosuvastatin (CRESTOR) 10 MG tablet Take 10 mg by mouth daily.  01/26/17   [provider]  sodium bicarbonate 650 MG tablet Take 650 mg by mouth 3 (three) times daily.  07/24/19   [provider]  TRESIBA FLEXTOUCH 200 UNIT/ML SOPN Inject 10 Units into the skin at bedtime. 04/15/17   [provider]    Allergies    Patient has no known allergies.  Review of Systems   Review of Systems  Constitutional: Negative for chills, fatigue and fever.  HENT: Negative for congestion.   Eyes: Negative for visual disturbance.  Respiratory: Negative for chest tightness and shortness of breath.   Cardiovascular: Negative for chest pain.  Gastrointestinal: Negative for abdominal pain, constipation, diarrhea, nausea and vomiting.  Genitourinary: Negative for flank pain and frequency.  Musculoskeletal: Negative for back pain, neck pain and neck stiffness.  Skin: Positive for wound (abrasion).  Neurological: Negative for dizziness, syncope, light-headedness, numbness and headaches.  Psychiatric/Behavioral: Negative for agitation.  All other systems reviewed and are negative.   Physical Exam Updated Vital Signs BP (!) 218/84 (BP Location: Right Arm)   Pulse 74   Temp 98.8 F (37.1 C) (Oral)   Resp 20   SpO2 99%   Physical Exam Vitals and nursing note reviewed.  Constitutional:      General: He is not in acute distress.    Appearance: He is well-developed. He is not ill-appearing, toxic-appearing or diaphoretic.  HENT:      Head: Normocephalic and atraumatic.     Nose: No congestion or rhinorrhea.     Mouth/Throat:     Mouth: Mucous membranes are moist.     Pharynx: No oropharyngeal exudate or posterior oropharyngeal erythema.  Eyes:     Extraocular Movements: Extraocular movements intact.     Conjunctiva/sclera: Conjunctivae normal.     Pupils: Pupils are equal, round, and reactive to light.  Cardiovascular:     Rate and Rhythm: Normal rate and regular rhythm.     Heart sounds: No murmur heard.   Pulmonary:     Effort: Pulmonary effort is normal. No respiratory distress.     Breath sounds: Normal breath sounds. No wheezing, rhonchi or rales.  Chest:     Chest wall: No tenderness.  Abdominal:     Palpations: Abdomen is soft.  Tenderness: There is no abdominal tenderness. There is no right CVA tenderness, left CVA tenderness, guarding or rebound.  Musculoskeletal:        General: Swelling, tenderness and signs of injury present.       Arms:     Cervical back: Neck supple. No tenderness.     Right knee: No swelling, deformity or lacerations. Tenderness present.     Right lower leg: No edema.     Left lower leg: No edema.       Legs:     Comments: Normal grossly, sensation, and pulses in upper extremities and lower extremities.  Minimal lower extremity edema.  Skin:    General: Skin is warm and dry.     Capillary Refill: Capillary refill takes less than 2 seconds.     Findings: No erythema.  Neurological:     General: No focal deficit present.     Mental Status: He is alert.  Psychiatric:        Mood and Affect: Mood normal.     ED Results / Procedures / Treatments   Labs (all labs ordered are listed, but only abnormal results are displayed) Labs Reviewed  BASIC METABOLIC PANEL - Abnormal; Notable for the following components:      Result Value   CO2 20 (*)    BUN 83 (*)    Creatinine, Ser 6.84 (*)    Calcium 8.4 (*)    GFR, Estimated 8 (*)    All other components within normal  limits  CBC - Abnormal; Notable for the following components:   WBC 11.7 (*)    RBC 2.81 (*)    Hemoglobin 8.5 (*)    HCT 26.2 (*)    All other components within normal limits  CBG MONITORING, ED - Abnormal; Notable for the following components:   Glucose-Capillary 66 (*)    All other components within normal limits  CBG MONITORING, ED - Abnormal; Notable for the following components:   Glucose-Capillary 56 (*)    All other components within normal limits  CBG MONITORING, ED - Abnormal; Notable for the following components:   Glucose-Capillary 178 (*)    All other components within normal limits  URINALYSIS, ROUTINE W REFLEX MICROSCOPIC    EKG EKG Interpretation  Date/Time:  Friday Sep 06 2020 09:56:16 EDT Ventricular Rate:  68 PR Interval:  388 QRS Duration: 80 QT Interval:  416 QTC Calculation: 442 R Axis:   38 Text Interpretation: Sinus rhythm with 1st degree A-V block Otherwise normal ECG when compared to prior, similar appearance with t wave now upright in lead 3. No STEMI Confirmed by Antony Blackbird [phone]) on 09/06/2020 11:10:21 AM   Radiology DG Elbow Complete Right  Result Date: 09/06/2020 CLINICAL DATA:  Fall EXAM: RIGHT ELBOW - COMPLETE 3+ VIEW COMPARISON:  None. FINDINGS: Small joint effusion. Chondrocalcinosis in the ulnar collateral ligament. Mineralization along the medial humeral condyle, sequela of prior epicondylitis. Soft tissue swelling over the posterior aspect of the elbow. IMPRESSION: Small joint effusion and posterior soft tissue swelling. No acute fracture or dislocation. Electronically Signed   By: Dahlia Bailiff MD   On: 09/06/2020 11:17   DG Knee Complete 4 Views Right  Result Date: 09/06/2020 CLINICAL DATA:  Knee pain after fall EXAM: RIGHT KNEE - COMPLETE 4+ VIEW COMPARISON:  None. FINDINGS: No evidence of fracture, dislocation, or joint effusion. No evidence of significant arthropathy. Superior patellar enthesophyte. Vascular calcifications.  IMPRESSION: No acute osseous abnormality. Electronically Signed   By:  Dahlia Bailiff MD   On: 09/06/2020 11:18    Procedures Procedures   Medications Ordered in ED Medications  dextrose 50 % solution 50 mL (50 mLs Intravenous Given 09/06/20 1323)  cloNIDine (CATAPRES) tablet 0.1 mg (0.1 mg Oral Given 09/06/20 1246)    ED Course  I have reviewed the triage vital signs and the nursing notes.  Pertinent labs & imaging results that were available during my care of the patient were reviewed by me and considered in my medical decision making (see chart for details).    MDM Rules/Calculators/A&P                          KALEM ROCKWELL is a 80 y.o. male with a past medical history significant for latent tuberculosis currently on outpatient treatment, diabetes, hypertension, hyperlipidemia, prior kidney stones, and CKD scheduled for dialysis in the near future who was arriving at the hospital for dialysis catheter placement after a failure of his left upper extremity fistula who had an episode of generalized weakness, lightheadedness, and a fall to the ground hurting his right elbow and right knee.  Patient reports that he felt like he had not had much to eat or drink as he was n.p.o. for his procedure this morning.  When getting out of car and walking into this hospital, he got weak all over and felt slightly lightheaded.  He then fell to the ground but did not lose consciousness or hit his head.  He is primarily complaining of pain in his right elbow where he has an abrasion and his right knee where it is slightly swollen.  He denies any palpitations, chest pain, shortness of breath with it.  Denies any current lightheadedness.  Denies any nausea, vomiting, or other complaints.  Patient is unsure about his tetanus vaccination status but he does not want a tetanus shot at this time when offered.  On arrival, blood pressure is very elevated in the 220s.  On my assessment it was still above 295  systolic.  Lungs were clear and chest was nontender.  Abdomen was nontender.  Patient has tenderness in his right elbow where there is a 2 cm skin tear/abrasion.  Normal pulse, strength, and sensation in upper extremities.  Tenderness of the lateral right knee but otherwise normal sensation, strength, and pulses distally.  No hip tenderness or ankle tenderness.  Patient had screening labs including a CBG that was in the 60s.  I do suspect there could be a component of hypoglycemia due to his fasting for his procedure.  We will give D50.  CBC shows mild leukocytosis and similar anemia to prior.  Metabolic panel shows elevated creatinine similar to prior.  X-rays of the knee and elbow did not show any acute fracture but showed some soft tissue swelling and effusion.  Will give D50 for the glucose and call IR to see if he can get the catheter placed as based on his elevated blood pressures and the episode today, patient may need dialysis more emergently than as an outpatient.  IR called back and stated that they would be happy to place the catheter either if were able to discharge him he can come upstairs to get it done as an outpatient or if he needs admission for the blood pressure control and potentially more emergent dialysis and glucose monitoring, they would be able to place it today.  Will call nephrology at their request and discussed the plan.  12:27 PM Just woke with Dr. Royce Macadamia with nephrology.  She feels that given the patient's improvement in his labs aside from the  hypoglycemia, she does not think he needs to be admitted for emergent dialysis at this time.  She does agree with giving him an oral dose of 0.1 mg of clonidine and she feels he is appropriate for discharge to get his dialysis catheter placed as an outpatient today and then continue as an outpatient to start his dialysis.  If symptoms were to change, then patient may need admission but at this point, she feels he is likely safe to  continue his outpatient regimen after getting over this hyperglycemia likely cause this fall.  12:45 PM The patient and family agree with treating the blood pressure with oral clonidine and then discharge after glucose improves.  We will get CBG after the D50.  Patient be discharged to go upstairs and get his dialysis catheter placed.   1:34 PM Spoke with the charge nurse in IR and they will have the patient brought up to get the procedure done but he will be discharged before he goes upstairs.  Patient received his D50 and repeat CBG is improving at 178.  He is feeling better.  After clonidine, blood pressure started to decrease and he still feels well. Patient will be discharged to get his catheter placement upstairs and then will be discharged from his outpatient procedure to continue his outpatient dialysis plan.  He knows to return if any symptoms change or worsen.   Final Clinical Impression(s) / ED Diagnoses Final diagnoses:  Fall, initial encounter  Abrasion of right elbow, initial encounter  Acute pain of right knee  Hypoglycemia  Elevated blood pressure reading    Rx / DC Orders ED Discharge Orders    None     Clinical Impression: 1. Fall, initial encounter   2. Abrasion of right elbow, initial encounter   3. Acute pain of right knee   4. Hypoglycemia   5. Elevated blood pressure reading     Disposition: Discharge  Condition: Good  I have discussed the results, Dx and Tx plan with the pt(& family if present). He/she/they expressed understanding and agree(s) with the plan. Discharge instructions discussed at great length. Strict return precautions discussed and pt &/or family have verbalized understanding of the instructions. No further questions at time of discharge.    New Prescriptions   No medications on file    Follow Up: Celene Squibb, MD [place] [address] [place] [address] [id number] mc [place]       Orean Giarratano, Gwenyth Allegra, MD 09/06/20 [phone]

## 2020-09-06 NOTE — ED Notes (Signed)
Pt is unable to ambulate with walker.

## 2020-09-06 NOTE — ED Notes (Signed)
Pt CBG 66, RN and MD notified

## 2020-09-06 NOTE — ED Notes (Signed)
Pt brought back from IR due to not having IV access

## 2020-09-06 NOTE — Discharge Instructions (Signed)
Your work-up today showed soft tissue injuries from your fall with the abrasion to your elbow but no fracture seen.  The knee showed no fracture as well.  Suspect soft tissue injury.  Your glucose was low likely because you are fasting due to your procedure this morning.  We are able to increase her glucose with the amp of D50 through an IV.  Your labs all were improving and similar to prior aside from the glucose.  I spoke with IR and nephrology and they feel comfortable with you being discharged to get the catheter placed today and then following up as an outpatient to continue outpatient dialysis with plan.  We did give a one-time dose of clonidine that nephrology recommended for the blood pressure.  Please eat and drink after the procedure and rest and stay hydrated.  Please call to follow-up as directed.  If any symptoms change or worsen acutely, please return to the nearest emergency department.

## 2020-09-06 NOTE — ED Provider Notes (Signed)
[facility] EMERGENCY DEPARTMENT Provider Note   CSN: [id number] Arrival date & time: 09/06/20  1609     History Chief Complaint  Patient presents with  . Knee Injury    Trevor [name] is a 80 y.o. male.  HPI patient presents with bleeding.  He was pulled from the waiting room by nurse after noting significant bleeding to the right side of his gown.  The patient was seen earlier; he came into the hospital for a scheduled dialysis catheter placement procedure but tripped and landed on his knee and has had knee pain and has been unable to walk.  He has active bleeding from the right side of his chest from the site where his PermCath was placed and he says he has noticed this but has not had lightheadedness, weakness, or feel like he is going to pass out.  X-ray of his knee earlier was negative.     Past Medical History:  Diagnosis Date  . Anemia   . Chronic kidney disease    Stage 4?   . Diabetes mellitus without complication ([place])   . Hepatitis    not sure what type - over 20 years ago  . History of kidney stones   . Hyperlipidemia   . Hypertension     Patient Active Problem List   Diagnosis Date Noted  . 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 Medication monitoring encounter 08/27/2020  . Immunization counseling 08/27/2020  . TB lung, latent 07/25/2020  . Diabetes mellitus with stage 5 chronic kidney disease ([place]) 05/02/2020  . Thyroid nodule 05/02/2020  . Chronic kidney disease, stage IV (severe) ([place]) 05/16/2019  . Special screening for malignant neoplasms, colon     Past Surgical History:  Procedure Laterality Date  . AV FISTULA PLACEMENT Left 08/09/2019   Procedure: LEFT ARM Basilic  ARTERIOVENOUS  FISTULA CREATION;  Surgeon: Waynetta Sandy, MD;  Location: [facility];  Service: Vascular;  Laterality: Left;  . BASCILIC VEIN TRANSPOSITION Left 11/30/2019   Procedure: LEFT SECOND STAGE BASCILIC VEIN TRANSPOSITION;  Surgeon: Waynetta Sandy, MD;  Location: [facility];  Service: Vascular;  Laterality: Left;  . COLONOSCOPY N/A 07/07/2017   Procedure: COLONOSCOPY;  Surgeon: Danie Binder, MD;  [facility];  Service: Endoscopy;  Laterality: N/A;  8:30  . IR FLUORO GUIDE CV LINE RIGHT  09/06/2020  . IR US GUIDE VASC ACCESS RIGHT  09/06/2020       Family History  Problem Relation Age of Onset  . Diabetes Mother   . Diabetes Brother   . Heart attack Brother   . Colon cancer Son   . Lung cancer Son     Social History   Tobacco Use  . Smoking status: Never Smoker  . Smokeless tobacco: Never Used  Vaping Use  . Vaping Use: Never used  Substance Use Topics  . Alcohol use: Never  . Drug use: Never    Home Medications Prior to Admission medications   Medication Sig Start Date End Date Taking? Authorizing Provider  oxyCODONE-acetaminophen (PERCOCET/ROXICET) 5-325 MG tablet Take 1 tablet by mouth every 8 (eight) hours as needed for up to 6 doses for severe pain. 09/06/20  Yes Aris Lot, MD  Accu-Chek Softclix Lancets lancets  09/13/19   [provider]  Alcohol Swabs (B-D SINGLE USE SWABS REGULAR) PADS  08/11/19   [provider]  aspirin EC 81 MG tablet Take 81 mg by mouth daily.    [provider]  Blood Glucose Monitoring Suppl (ONETOUCH VERIO) w/Device KIT 1 each by Does not apply route as needed. 05/03/20   Cassandria Anger, MD  carvedilol (COREG) 3.125 MG tablet Take 6.25 mg by mouth 2 (two) times daily with a meal.  10/18/19 10/17/20  [provider]  Cholecalciferol 25 MCG (1000 UT) tablet Take 1,000 Units by mouth daily.  12/23/09   [provider]  cloNIDine (CATAPRES) 0.2 MG tablet Take 0.2 mg by mouth 3 (three) times daily.  04/12/17   [provider]  furosemide (LASIX) 40 MG tablet Take 40 mg by mouth 2 (two) times daily as needed. 07/14/19 11/20/19  [provider]  Garlic 9735 MG CAPS Take 1,000 mg by mouth daily.    [provider]  glucose blood (ONETOUCH VERIO) test strip Use as instructed 05/03/20   Cassandria Anger, MD  hydrALAZINE (APRESOLINE) 25 MG tablet Take 2 tablets by mouth 3 (three) times daily. 07/03/20   [provider]  HYDROcodone-acetaminophen (NORCO/VICODIN) 5-325 MG tablet Take 1 tablet by mouth every 4 (four) hours as needed. 04/30/20   Henderly, Britni A, PA-C  isoniazid (NYDRAZID) 300 MG tablet Take 1 tablet (300 mg total) by mouth daily. 07/25/20   Rosiland Oz, MD  lidocaine (LIDODERM) 5 % Place 1 patch onto the skin daily. Remove & Discard patch within 12 hours or as directed by MD 04/30/20   Henderly, Britni A, PA-C  OVER THE COUNTER MEDICATION Take 1 tablet by mouth daily. Kidney Cleanser    [provider]  pyridOXINE (VITAMIN B-6) 50 MG tablet Take 1 tablet (50 mg total) by mouth daily. 07/25/20   Rosiland Oz, MD  rosuvastatin (CRESTOR) 10 MG tablet Take 10 mg by mouth daily.  01/26/17   [provider]  sodium bicarbonate 650 MG tablet Take 650 mg by mouth 3 (three) times daily.  07/24/19   [provider]  TRESIBA FLEXTOUCH 200 UNIT/ML SOPN Inject 10 Units into the skin at bedtime. 04/15/17   [provider]    Allergies    Patient has no known allergies.  Review of Systems   Review of Systems  Constitutional: Negative for chills and fever.  HENT: Negative for ear pain and sore throat.   Eyes: Negative for pain and visual disturbance.  Respiratory: Negative for cough and shortness of breath.   Cardiovascular: Negative for chest pain and palpitations.  Gastrointestinal: Negative for abdominal pain and vomiting.  Genitourinary: Negative for dysuria and hematuria.  Musculoskeletal: Negative for arthralgias and back pain.  Skin: Negative for color change and rash.  Neurological: Negative for seizures and syncope.  All other systems reviewed and are negative.   Physical Exam Updated Vital Signs BP (!) 198/100   Pulse  88   Temp 98.5 F (36.9 C) (Oral)   Resp 20   Ht '5\' 10"'  (1.778 m)   Wt 110.2 kg   SpO2 97%   BMI 34.86 kg/m   Physical Exam Vitals and nursing note reviewed.  Constitutional:      Appearance: Normal appearance. He is well-developed.  HENT:     Head: Normocephalic and atraumatic.  Eyes:     Extraocular Movements: Extraocular movements intact.     Conjunctiva/sclera: Conjunctivae normal.  Cardiovascular:     Rate and Rhythm: Normal rate and regular rhythm.     Heart sounds: No murmur heard.   Pulmonary:     Effort: Pulmonary effort is normal. No respiratory distress.     Breath sounds:  Normal breath sounds.  Abdominal:     Palpations: Abdomen is soft.     Tenderness: There is no abdominal tenderness.  Musculoskeletal:     Cervical back: Normal range of motion and neck supple.     Right lower leg: No edema.     Left lower leg: No edema.     Comments: Significant left knee pain with standing and he is unable to bear weight on the knee.  Pulses intact distally.  No neurologic deficit distally.  Skin:    General: Skin is warm and dry.     Comments: Active bleeding which appears brisk and pulsatile from the site of his PermCath insertion.  Hematoma around the PermCath.  The PermCath dressing has blood soaked gauze underneath the entrance of the dressing.  Neurological:     General: No focal deficit present.     Mental Status: He is alert.     Motor: No weakness.  Psychiatric:        Behavior: Behavior normal.        Thought Content: Thought content normal.     ED Results / Procedures / Treatments   Labs (all labs ordered are listed, but only abnormal results are displayed) Labs Reviewed  CBC - Abnormal; Notable for the following components:      Result Value   WBC 17.0 (*)    RBC 3.10 (*)    Hemoglobin 9.6 (*)    HCT 28.2 (*)    All other components within normal limits    EKG None  Radiology DG Elbow Complete Right  Result Date: 09/06/2020 CLINICAL DATA:   Fall EXAM: RIGHT ELBOW - COMPLETE 3+ VIEW COMPARISON:  None. FINDINGS: Small joint effusion. Chondrocalcinosis in the ulnar collateral ligament. Mineralization along the medial humeral condyle, sequela of prior epicondylitis. Soft tissue swelling over the posterior aspect of the elbow. IMPRESSION: Small joint effusion and posterior soft tissue swelling. No acute fracture or dislocation. Electronically Signed   By: Dahlia Bailiff MD   On: 09/06/2020 11:17   CT Knee Right Wo Contrast  Result Date: 09/06/2020 CLINICAL DATA:  Knee pain, stress fracture suspected, neg xray EXAM: CT OF THE right KNEE WITHOUT CONTRAST TECHNIQUE: Multidetector CT imaging of the right knee was performed according to the standard protocol. Multiplanar CT image reconstructions were also generated. COMPARISON:  Right knee radiograph 09/06/2020 FINDINGS: Bones/Joint/Cartilage There is no evidence of acute fracture. There is mild patellofemoral compartment degenerative change. Supra and infrapatellar enthesophytes. Ligaments Suboptimally assessed by CT. Muscles and Tendons There is no significant muscle atrophy. Soft tissues Mild superficial soft tissue swelling along the knee. There is no significant joint effusion. Vascular calcifications. Small Baker cyst. IMPRESSION: No evidence of acute fracture. Mild soft tissue swelling.  Small Baker cyst. Electronically Signed   By: Maurine Simmering   On: 09/06/2020 18:38   IR Fluoro Guide CV Line Right  Result Date: 09/06/2020 INDICATION: 80 year old male with history of end-stage renal disease requiring central venous access for hemodialysis. EXAM: TUNNELED CENTRAL VENOUS HEMODIALYSIS CATHETER PLACEMENT WITH ULTRASOUND AND FLUOROSCOPIC GUIDANCE MEDICATIONS: 2 g Ancef, intravenous, administered in appropriate window for prophylaxis prior to procedure start ANESTHESIA/SEDATION: Moderate (conscious) sedation was employed during this procedure. A total of Versed 1 mg and Fentanyl 25 mcg was administered  intravenously. Moderate Sedation Time: 16 minutes. The patient's level of consciousness and vital signs were monitored continuously by radiology nursing throughout the procedure under my direct supervision. FLUOROSCOPY TIME:  0.1 minutes (3 mGy). COMPLICATIONS: None immediate.  PROCEDURE: Informed written consent was obtained from the patient after a discussion of the risks, benefits, and alternatives to treatment. Questions regarding the procedure were encouraged and answered. The right neck and chest were prepped with chlorhexidine in a sterile fashion, and a sterile drape was applied covering the operative field. Maximum barrier sterile technique with sterile gowns and gloves were used for the procedure. A timeout was performed prior to the initiation of the procedure. After creating a small venotomy incision, a 21 gauge micropuncture kit was utilized to access the internal jugular vein. Real-time ultrasound guidance was utilized for vascular access including the acquisition of a permanent ultrasound image documenting patency of the accessed vessel. A Rosen wire was advanced to the level of the IVC and the micropuncture sheath was exchanged for an 8 Fr dilator. A 14.5 French tunneled hemodialysis catheter measuring 23 cm from tip to cuff was tunneled in a retrograde fashion from the anterior chest wall to the venotomy incision. Serial dilation was then performed an a peel-away sheath was placed. The catheter was then placed through the peel-away sheath with the catheter tip ultimately positioned within the right atrium. Final catheter positioning was confirmed and documented with a spot radiographic image. The catheter aspirates and flushes normally. The catheter was flushed with appropriate volume heparin dwells. The catheter exit site was secured with a 0-Silk retention suture. The venotomy incision was closed with Dermabond. Sterile dressings were applied. The patient tolerated the procedure well without  immediate post procedural complication. IMPRESSION: Successful placement of 23 cm tip to cuff tunneled hemodialysis catheter via the right internal jugular vein with catheter tip terminating within the right atrium. The catheter is ready for immediate use. Ruthann Cancer, MD [facility] Radiology Electronically Signed   By: Ruthann Cancer MD   On: 09/06/2020 15:05   IR US Guide Vasc Access Right  Result Date: 09/06/2020 INDICATION: 79 year old male with history of end-stage renal disease requiring central venous access for hemodialysis. EXAM: TUNNELED CENTRAL VENOUS HEMODIALYSIS CATHETER PLACEMENT WITH ULTRASOUND AND FLUOROSCOPIC GUIDANCE MEDICATIONS: 2 g Ancef, intravenous, administered in appropriate window for prophylaxis prior to procedure start ANESTHESIA/SEDATION: Moderate (conscious) sedation was employed during this procedure. A total of Versed 1 mg and Fentanyl 25 mcg was administered intravenously. Moderate Sedation Time: 16 minutes. The patient's level of consciousness and vital signs were monitored continuously by radiology nursing throughout the procedure under my direct supervision. FLUOROSCOPY TIME:  0.1 minutes (3 mGy). COMPLICATIONS: None immediate. PROCEDURE: Informed written consent was obtained from the patient after a discussion of the risks, benefits, and alternatives to treatment. Questions regarding the procedure were encouraged and answered. The right neck and chest were prepped with chlorhexidine in a sterile fashion, and a sterile drape was applied covering the operative field. Maximum barrier sterile technique with sterile gowns and gloves were used for the procedure. A timeout was performed prior to the initiation of the procedure. After creating a small venotomy incision, a 21 gauge micropuncture kit was utilized to access the internal jugular vein. Real-time ultrasound guidance was utilized for vascular access including the acquisition  of a permanent ultrasound image documenting patency of the accessed vessel. A Rosen wire was advanced to the level of the IVC and the micropuncture sheath was exchanged for an 8 Fr dilator. A 14.5 French tunneled hemodialysis catheter measuring 23 cm from tip to cuff was tunneled in a retrograde fashion from the anterior chest wall to the venotomy incision. Serial dilation was then performed  an a peel-away sheath was placed. The catheter was then placed through the peel-away sheath with the catheter tip ultimately positioned within the right atrium. Final catheter positioning was confirmed and documented with a spot radiographic image. The catheter aspirates and flushes normally. The catheter was flushed with appropriate volume heparin dwells. The catheter exit site was secured with a 0-Silk retention suture. The venotomy incision was closed with Dermabond. Sterile dressings were applied. The patient tolerated the procedure well without immediate post procedural complication. IMPRESSION: Successful placement of 23 cm tip to cuff tunneled hemodialysis catheter via the right internal jugular vein with catheter tip terminating within the right atrium. The catheter is ready for immediate use. Ruthann Cancer, MD [facility] Radiology Electronically Signed   By: Ruthann Cancer MD   On: 09/06/2020 15:05   DG Knee Complete 4 Views Right  Result Date: 09/06/2020 CLINICAL DATA:  Knee pain after fall EXAM: RIGHT KNEE - COMPLETE 4+ VIEW COMPARISON:  None. FINDINGS: No evidence of fracture, dislocation, or joint effusion. No evidence of significant arthropathy. Superior patellar enthesophyte. Vascular calcifications. IMPRESSION: No acute osseous abnormality. Electronically Signed   By: Dahlia Bailiff MD   On: 09/06/2020 11:18    Procedures Procedures   Medications Ordered in ED Medications  oxyCODONE-acetaminophen (PERCOCET/ROXICET) 5-325 MG per tablet 1 tablet (1 tablet  Oral Given 09/06/20 1735)  cloNIDine (CATAPRES) tablet 0.2 mg (0.2 mg Oral Given 09/06/20 1804)  thrombin 5,000 units for percutaneous treatment of pseudoaneurysm ( Percutaneous Given by Other 09/06/20 1953)  gelatin adsorbable (GELFOAM/SURGIFOAM) sponge 12-7 mm 1 each (1 each Topical Given by Other 09/06/20 1953)  lidocaine-EPINEPHrine (XYLOCAINE W/EPI) 2 %-1:200000 (PF) injection 10 mL (10 mLs Intradermal Given by Other 09/06/20 1954)  oxyCODONE (Oxy IR/ROXICODONE) immediate release tablet 5 mg (5 mg Oral Given 09/06/20 2132)    ED Course  I have reviewed the triage vital signs and the nursing notes.  Pertinent labs & imaging results that were available during my care of the patient were reviewed by me and considered in my medical decision making (see chart for details).    MDM Rules/Calculators/A&P                           Patient presents with bleeding.  On arrival, a sterile field was established and the dressing was taken down and irrigated with sterile saline.  This revealed a pulsatile side at the insertion of the PermCath.  Direct pressure was held for 10 minutes but on reevaluation the pulsatile bleeding continued.  I discussed this with the interventional radiologist who performed the procedure and he recommended that we hold pressure for 10 minutes both at the PermCath insertion site and at the site in which it inserts to the IJ.  This was done but the site continued to bleed.  The interventional radiologist came into the emergency department and manage the site and after this on a repeat evaluation there was no further bleeding.  The hemoglobin of the patient was stable.  He was hypertensive and received his home dose clonidine but did not have any other hypertensive emergency findings on history and physical.  We obtained a CT of the knee due to his severe pain but it was negative.  He likely has a ligamentous or meniscal injury and we discussed this because he feels like he twisted his  knee when he fell.  After 2 doses of pain medication the patient was  able to sufficiently mobilize in order to go home.  I did discuss with him the need to have a safe environment at home which she feels like he has.  I did discuss alternatives to discharge such as placement but he would like to go home and has a wheelchair at home.  He left in the care of his wife in stable condition  Final Clinical Impression(s) / ED Diagnoses Final diagnoses:  Bleeding    Rx / DC Orders ED Discharge Orders         Ordered    oxyCODONE-acetaminophen (PERCOCET/ROXICET) 5-325 MG tablet  Every 8 hours PRN        09/06/20 2150           Aris Lot, MD 09/07/20 0019    Rex Kras, Wenda Overland, MD 09/08/20 1447

## 2020-09-06 NOTE — ED Notes (Signed)
Hildred Alamin, sister [phone] would like an update

## 2020-09-06 NOTE — Discharge Instructions (Addendum)
Central Line Dialysis Access Placement, Care After This sheet gives you information about how to care for yourself after your procedure. Your health care provider may also give you more specific instructions. If you have problems or questions, contact your health care provider. What can I expect after the procedure? After the procedure, it is common to have:  Mild pain or discomfort.  Mild redness, swelling, or bruising around your incision.  A small amount of blood or clear fluid coming from your incision. Follow these instructions at home: Medicines  Take over-the-counter and prescription medicines only as told by your health care provider.  If you were prescribed an antibiotic medicine, take it as told by your health care provider. Do not stop using the antibiotic even if you start to feel better. Incision care  Follow instructions from your health care provider about how to take care of your incision. Make sure you: ? Wash your hands with soap and water for at least 20 seconds before and after you change your bandage (dressing). If soap and water are not available, use hand sanitizer. ? Change your dressing as told by your health care provider.  ? Leave stitches (sutures) in place.  Check your incision area every day for signs of infection. Check for: ? More redness, swelling, or pain. ? More fluid or blood. ? Warmth. ? Pus or a bad smell.   Managing pain and swelling  If directed, put ice on the catheter site. To do this: ? Put ice in a plastic bag. ? Place a towel between your skin and the bag. ? Leave the ice on for 20 minutes, 2-3 times a day. Activity  If you were given a sedative during the procedure, it can affect you for several hours. Do not drive or operate machinery until your health care provider says that it is safe.  Do not lift anything that is heavier than 10 lb (4.5 kg), or the limit that you are told, until your health care provider says that it is  safe.  Return to your normal activities as told by your health care provider. Ask your health care provider what activities are safe for you. General instructions  Follow instructions from your health care provider about eating or drinking restrictions.  Do not use any products that contain nicotine or tobacco, such as cigarettes, e-cigarettes, and chewing tobacco. These can delay incision healing after surgery. If you need help quitting, ask your health care provider.  Do not take baths, swim, or use a hot tub until your health care provider approves. Ask your health care provider if you may take showers. You may only be allowed to take sponge baths.  Wear compression stockings as told by your health care provider. These stockings help to prevent blood clots and reduce swelling in your legs.  Keep all follow-up visits as told by your health care provider. This is important.   Contact a health care provider if:  Your catheter gets pulled out of place.  Your catheter site becomes itchy.  You develop a rash around your catheter site.  You have any of these signs of infection: ? More redness, swelling, or pain around your incision. ? More fluid or blood coming from your incision. ? Warmth coming from your incision. ? Pus or a bad smell coming from your incision. ? A fever. Get help right away if:  You become light-headed or dizzy.  You have chest pain or a fast heart rate.  You faint.  You have difficulty breathing.  Your catheter gets pulled out completely.  You have discomfort in your arms, back, neck, or jaw. These symptoms may represent a serious problem that is an emergency. Do not wait to see if the symptoms will go away. Get medical help right away. Call your local emergency services (911 in the [place] Do not drive yourself to the hospital. Summary  After the procedure, it is common to have mild symptoms of pain, redness, swelling, or bruising around your incision. It  is also common to have a small amount of blood or clear fluid coming from your incision.  Check your incision area every day for signs of infection.  Do not take baths, swim, or use a hot tub until your health care provider approves. Ask your health care provider if you may take showers. You may only be allowed to take sponge baths.  Get help right away if you have chest pain or difficulty breathing, or if your catheter gets pulled out completely. This information is not intended to replace advice given to you by your health care provider. Make sure you discuss any questions you have with your health care provider. Document Revised: 02/16/2019 Document Reviewed: 02/16/2019 Elsevier Patient Education  2021 [employer].  Moderate Conscious Sedation, Adult, Care After This sheet gives you information about how to care for yourself after your procedure. Your health care provider may also give you more specific instructions. If you have problems or questions, contact your health care provider. What can I expect after the procedure? After the procedure, it is common to have:  Sleepiness for several hours.  Impaired judgment for several hours.  Difficulty with balance.  Vomiting if you eat too soon. Follow these instructions at home: For the time period you were told by your health care provider: 24 hours  Rest.  Do not participate in activities where you could fall or become injured.  Do not drive or use machinery.  Do not drink alcohol.  Do not take sleeping pills or medicines that cause drowsiness.  Do not make important decisions or sign legal documents.  Do not take care of children on your own.      Eating and drinking  Follow the diet recommended by your health care provider.  Drink enough fluid to keep your urine pale yellow.  If you vomit: ? Drink water, juice, or soup when you can drink without vomiting. ? Make sure you have little or no nausea before eating  solid foods.   General instructions  Take over-the-counter and prescription medicines only as told by your health care provider.  Have a responsible adult stay with you for the time you are told. It is important to have someone help care for you until you are awake and alert.  Do not smoke.  Keep all follow-up visits as told by your health care provider. This is important. Contact a health care provider if:  You are still sleepy or having trouble with balance after 24 hours.  You feel light-headed.  You keep feeling nauseous or you keep vomiting.  You develop a rash.  You have a fever.  You have redness or swelling around the IV site. Get help right away if:  You have trouble breathing.  You have new-onset confusion at home. Summary  After the procedure, it is common to feel sleepy, have impaired judgment, or feel nauseous if you eat too soon.  Rest after you get home. Know the things you should not  do after the procedure.  Follow the diet recommended by your health care provider and drink enough fluid to keep your urine pale yellow.  Get help right away if you have trouble breathing or new-onset confusion at home. This information is not intended to replace advice given to you by your health care provider. Make sure you discuss any questions you have with your health care provider. Document Revised: 07/28/2019 Document Reviewed: 02/23/2019 Elsevier Patient Education  2021 [employer].

## 2020-09-06 NOTE — ED Notes (Signed)
Unable to obtain IV access. IR ready for pt. Transport at bedside.

## 2020-09-06 NOTE — ED Triage Notes (Signed)
Pt reports he was parking his car to go to short stay for dialysis port placement . Pt reports he got dizziness and fell on his right arm. Pt has lac to elbow.Pt is hypertensive. Pt denies hitting his head

## 2020-09-06 NOTE — ED Triage Notes (Signed)
Bleeding from dialysis catheter that was placed today in his chest  Has not been dialyzed yet

## 2020-09-06 NOTE — Progress Notes (Signed)
1mg  of Versed & 62mcg Fentanyl wasted in sink with Domenick Bookbinder, RN on 09/06/2020 @ 1518. Unable to waste in pyxis. Medications pulled under emergency room encounter and patient was discharged.

## 2020-09-06 NOTE — Discharge Instructions (Signed)
Take the pain medicine as needed. Follow-up with your doctor as soon as possible regarding your knee pain.

## 2020-09-06 NOTE — Procedures (Signed)
Interventional Radiology Procedure Note  Procedure: Tunneled hemodialysis catheter placement  Findings: Please refer to procedural dictation for full description. 23 cm dual lumen tunneled HD catheter placed, catheter tip in right atrium.  Complications: None immediate  Estimated Blood Loss: < 5 mL  Recommendations: Catheter ready for immediate use.   Ruthann Cancer, MD

## 2020-09-09 ENCOUNTER — Encounter (HOSPITAL_COMMUNITY): Payer: Self-pay | Admitting: Emergency Medicine

## 2020-09-09 ENCOUNTER — Emergency Department (HOSPITAL_COMMUNITY)
Admission: EM | Admit: 2020-09-09 | Discharge: 2020-09-09 | Disposition: A | Discharge status: Home / Self Care | Payer: Medicare HMO | Attending: Emergency Medicine | Admitting: Emergency Medicine

## 2020-09-09 ENCOUNTER — Other Ambulatory Visit: Payer: Self-pay

## 2020-09-09 DIAGNOSIS — M25561 Pain in right knee: Secondary | ICD-10-CM | POA: Diagnosis not present

## 2020-09-09 DIAGNOSIS — E1122 Type 2 diabetes mellitus with diabetic chronic kidney disease: Secondary | ICD-10-CM | POA: Diagnosis not present

## 2020-09-09 DIAGNOSIS — W19XXXA Unspecified fall, initial encounter: Secondary | ICD-10-CM | POA: Diagnosis not present

## 2020-09-09 DIAGNOSIS — S8991XA Unspecified injury of right lower leg, initial encounter: Secondary | ICD-10-CM | POA: Diagnosis not present

## 2020-09-09 DIAGNOSIS — Z79899 Other long term (current) drug therapy: Secondary | ICD-10-CM | POA: Insufficient documentation

## 2020-09-09 DIAGNOSIS — I120 Hypertensive chronic kidney disease with stage 5 chronic kidney disease or end stage renal disease: Secondary | ICD-10-CM | POA: Insufficient documentation

## 2020-09-09 DIAGNOSIS — M25569 Pain in unspecified knee: Secondary | ICD-10-CM | POA: Diagnosis not present

## 2020-09-09 DIAGNOSIS — N185 Chronic kidney disease, stage 5: Secondary | ICD-10-CM | POA: Insufficient documentation

## 2020-09-09 DIAGNOSIS — Y92233 Cafeteria of hospital as the place of occurrence of the external cause: Secondary | ICD-10-CM | POA: Insufficient documentation

## 2020-09-09 DIAGNOSIS — Z7982 Long term (current) use of aspirin: Secondary | ICD-10-CM | POA: Insufficient documentation

## 2020-09-09 DIAGNOSIS — I10 Essential (primary) hypertension: Secondary | ICD-10-CM | POA: Diagnosis not present

## 2020-09-09 LAB — CBC WITH DIFFERENTIAL/PLATELET
Abs Immature Granulocytes: 0.12 10*3/uL — ABNORMAL HIGH (ref 0.00–0.07)
Basophils Absolute: 0 10*3/uL (ref 0.0–0.1)
Basophils Relative: 0 %
Eosinophils Absolute: 0 10*3/uL (ref 0.0–0.5)
Eosinophils Relative: 0 %
HCT: 25.7 % — ABNORMAL LOW (ref 39.0–52.0)
Hemoglobin: 8.6 g/dL — ABNORMAL LOW (ref 13.0–17.0)
Immature Granulocytes: 1 %
Lymphocytes Relative: 4 %
Lymphs Abs: 0.6 10*3/uL — ABNORMAL LOW (ref 0.7–4.0)
MCH: 30.9 pg (ref 26.0–34.0)
MCHC: 33.5 g/dL (ref 30.0–36.0)
MCV: 92.4 fL (ref 80.0–100.0)
Monocytes Absolute: 1.6 10*3/uL — ABNORMAL HIGH (ref 0.1–1.0)
Monocytes Relative: 10 %
Neutro Abs: 14 10*3/uL — ABNORMAL HIGH (ref 1.7–7.7)
Neutrophils Relative %: 85 %
Platelets: 219 10*3/uL (ref 150–400)
RBC: 2.78 MIL/uL — ABNORMAL LOW (ref 4.22–5.81)
RDW: 12.6 % (ref 11.5–15.5)
WBC: 16.4 10*3/uL — ABNORMAL HIGH (ref 4.0–10.5)
nRBC: 0 % (ref 0.0–0.2)

## 2020-09-09 LAB — BASIC METABOLIC PANEL
Anion gap: 11 (ref 5–15)
BUN: 97 mg/dL — ABNORMAL HIGH (ref 8–23)
CO2: 19 mmol/L — ABNORMAL LOW (ref 22–32)
Calcium: 8.1 mg/dL — ABNORMAL LOW (ref 8.9–10.3)
Chloride: 102 mmol/L (ref 98–111)
Creatinine, Ser: 7.25 mg/dL — ABNORMAL HIGH (ref 0.61–1.24)
GFR, Estimated: 7 mL/min — ABNORMAL LOW (ref >60)
Glucose, Bld: 209 mg/dL — ABNORMAL HIGH (ref 70–99)
Potassium: 4.8 mmol/L (ref 3.5–5.1)
Sodium: 132 mmol/L — ABNORMAL LOW (ref 135–145)

## 2020-09-09 MED ORDER — CLONIDINE HCL 0.2 MG PO TABS
0.2000 mg | ORAL_TABLET | Freq: Three times a day (TID) | ORAL | Status: DC
  Administered 2020-09-09: 0.2 mg via ORAL
  Filled 2020-09-09: qty 1

## 2020-09-09 MED ORDER — OXYCODONE-ACETAMINOPHEN 5-325 MG PO TABS: 1.0000 | ORAL_TABLET | ORAL | 0 refills | Status: DC | PRN

## 2020-09-09 MED ORDER — CARVEDILOL 3.125 MG PO TABS
6.2500 mg | ORAL_TABLET | Freq: Two times a day (BID) | ORAL | Status: DC
  Filled 2020-09-09: qty 2

## 2020-09-09 MED ORDER — HYDROMORPHONE HCL 1 MG/ML IJ SOLN
1.0000 mg | Freq: Once | INTRAMUSCULAR | Status: AC
Start: 2020-09-09 — End: 2020-09-09
  Administered 2020-09-09: 1 mg via INTRAMUSCULAR
  Filled 2020-09-09: qty 1

## 2020-09-09 MED ORDER — HYDRALAZINE HCL 25 MG PO TABS
50.0000 mg | ORAL_TABLET | Freq: Three times a day (TID) | ORAL | Status: DC
  Administered 2020-09-09: 50 mg via ORAL
  Filled 2020-09-09: qty 2

## 2020-09-09 NOTE — ED Provider Notes (Signed)
[facility] EMERGENCY DEPARTMENT Provider Note   CSN: [id number] Arrival date & time: 09/09/20  4818     History Chief Complaint  Patient presents with  . Knee Pain    Trevor [name] is a 80 y.o. male.  Pt complains of right knee pain.  Pt fell on 5/27.  Pt had an xray and ct of knee.  Pt is out of pain medication.  Pt's blood pressure is high.  He forgot to take his medicine this am.  Pt reports he is suppose to start dialysis soon.  Pt thinks he may have been suppose to start today.  (Pt does not have a center)  Pt sees Dr. Theador Hawthorne.   The history is provided by the patient. No language interpreter was used.  Knee Pain Location:  Knee Injury: yes   Knee location:  R knee Pain details:    Severity:  Moderate   Onset quality:  Gradual   Timing:  Constant   Progression:  Worsening Chronicity:  New Relieved by:  Nothing Worsened by:  Nothing Ineffective treatments:  None tried      Past Medical History:  Diagnosis Date  . Anemia   . Chronic kidney disease    Stage 4?   . Diabetes mellitus without complication ([place])   . Hepatitis    not sure what type - over 20 years ago  . History of kidney stones   . Hyperlipidemia   . Hypertension     Patient Active Problem List   Diagnosis Date Noted  . 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 Medication monitoring encounter 08/27/2020  . Immunization counseling 08/27/2020  . TB lung, latent 07/25/2020  . Diabetes mellitus with stage 5 chronic kidney disease ([place]) 05/02/2020  . Thyroid nodule 05/02/2020  . Chronic kidney disease, stage IV (severe) ([place]) 05/16/2019  . Special screening for malignant neoplasms, colon     Past Surgical History:  Procedure Laterality Date  . AV FISTULA PLACEMENT Left 08/09/2019   Procedure: LEFT ARM Basilic  ARTERIOVENOUS  FISTULA CREATION;  Surgeon: Waynetta Sandy, MD;  Location: [place];  Service: Vascular;  Laterality: Left;  . BASCILIC VEIN TRANSPOSITION Left 11/30/2019   Procedure:  LEFT SECOND STAGE BASCILIC VEIN TRANSPOSITION;  Surgeon: Waynetta Sandy, MD;  Location: [place];  Service: Vascular;  Laterality: Left;  . COLONOSCOPY N/A 07/07/2017   Procedure: COLONOSCOPY;  Surgeon: Danie Binder, MD;  [facility];  Service: Endoscopy;  Laterality: N/A;  8:30  . IR FLUORO GUIDE CV LINE RIGHT  09/06/2020  . IR US GUIDE VASC ACCESS RIGHT  09/06/2020       Family History  Problem Relation Age of Onset  . Diabetes Mother   . Diabetes Brother   . Heart attack Brother   . Colon cancer Son   . Lung cancer Son     Social History   Tobacco Use  . Smoking status: Never Smoker  . Smokeless tobacco: Never Used  Vaping Use  . Vaping Use: Never used  Substance Use Topics  . Alcohol use: Never  . Drug use: Never    Home Medications Prior to Admission medications   Medication Sig Start Date End Date Taking? Authorizing Provider  oxyCODONE-acetaminophen (PERCOCET) 5-325 MG tablet Take 1 tablet by mouth every 4 (four) hours as needed for severe pain. 09/09/20 09/09/21 Yes Fransico Meadow, PA-C  Accu-Chek Softclix Lancets lancets  09/13/19   [provider]  Alcohol Swabs (B-D SINGLE USE SWABS REGULAR)  PADS  08/11/19   [provider]  aspirin EC 81 MG tablet Take 81 mg by mouth daily.    [provider]  Blood Glucose Monitoring Suppl (ONETOUCH VERIO) w/Device KIT 1 each by Does not apply route as needed. 05/03/20   Cassandria Anger, MD  carvedilol (COREG) 3.125 MG tablet Take 6.25 mg by mouth 2 (two) times daily with a meal.  10/18/19 10/17/20  [provider]  Cholecalciferol 25 MCG (1000 UT) tablet Take 1,000 Units by mouth daily.  12/23/09   [provider]  cloNIDine (CATAPRES) 0.2 MG tablet Take 0.2 mg by mouth 3 (three) times daily.  04/12/17   [provider]  furosemide (LASIX) 40 MG tablet Take 40 mg by mouth 2 (two) times daily as needed. 07/14/19 11/20/19  [provider]  Garlic 3151 MG  CAPS Take 1,000 mg by mouth daily.    [provider]  glucose blood (ONETOUCH VERIO) test strip Use as instructed 05/03/20   Cassandria Anger, MD  hydrALAZINE (APRESOLINE) 25 MG tablet Take 2 tablets by mouth 3 (three) times daily. 07/03/20   [provider]  HYDROcodone-acetaminophen (NORCO/VICODIN) 5-325 MG tablet Take 1 tablet by mouth every 4 (four) hours as needed. 04/30/20   Henderly, Britni A, PA-C  isoniazid (NYDRAZID) 300 MG tablet Take 1 tablet (300 mg total) by mouth daily. 07/25/20   Rosiland Oz, MD  lidocaine (LIDODERM) 5 % Place 1 patch onto the skin daily. Remove & Discard patch within 12 hours or as directed by MD 04/30/20   Henderly, Britni A, PA-C  OVER THE COUNTER MEDICATION Take 1 tablet by mouth daily. Kidney Cleanser    [provider]  pyridOXINE (VITAMIN B-6) 50 MG tablet Take 1 tablet (50 mg total) by mouth daily. 07/25/20   Rosiland Oz, MD  rosuvastatin (CRESTOR) 10 MG tablet Take 10 mg by mouth daily.  01/26/17   [provider]  sodium bicarbonate 650 MG tablet Take 650 mg by mouth 3 (three) times daily.  07/24/19   [provider]  TRESIBA FLEXTOUCH 200 UNIT/ML SOPN Inject 10 Units into the skin at bedtime. 04/15/17   [provider]    Allergies    Patient has no known allergies.  Review of Systems   Review of Systems  All other systems reviewed and are negative.   Physical Exam Updated Vital Signs BP (!) 226/88   Pulse 95   Temp 98.6 F (37 C) (Oral)   Resp 18   Ht '5\' 10"'  (1.778 m)   Wt 109.8 kg   SpO2 98%   BMI 34.72 kg/m   Physical Exam Vitals and nursing note reviewed.  Constitutional:      Appearance: He is well-developed.  HENT:     Head: Normocephalic.  Pulmonary:     Effort: Pulmonary effort is normal.  Abdominal:     General: There is no distension.  Musculoskeletal:        General: Normal range of motion.     Cervical back: Normal range of motion.     Comments:  Diffusely tender right knee,  Pain with movement. nv and ns intact  Neurological:     Mental Status: He is alert and oriented to person, place, and time.     ED Results / Procedures / Treatments   Labs (all labs ordered are listed, but only abnormal results are displayed) Labs Reviewed  BASIC METABOLIC PANEL - Abnormal; Notable for the following components:  Result Value   Sodium 132 (*)    CO2 19 (*)    Glucose, Bld 209 (*)    BUN 97 (*)    Creatinine, Ser 7.25 (*)    Calcium 8.1 (*)    GFR, Estimated 7 (*)    All other components within normal limits  CBC WITH DIFFERENTIAL/PLATELET - Abnormal; Notable for the following components:   WBC 16.4 (*)    RBC 2.78 (*)    Hemoglobin 8.6 (*)    HCT 25.7 (*)    Neutro Abs 14.0 (*)    Lymphs Abs 0.6 (*)    Monocytes Absolute 1.6 (*)    Abs Immature Granulocytes 0.12 (*)    All other components within normal limits    EKG None  Radiology No results found.  Procedures Procedures   Medications Ordered in ED Medications  carvedilol (COREG) tablet 6.25 mg (has no administration in time range)  cloNIDine (CATAPRES) tablet 0.2 mg (0.2 mg Oral Given 09/09/20 1118)  hydrALAZINE (APRESOLINE) tablet 50 mg (50 mg Oral Given 09/09/20 1118)  HYDROmorphone (DILAUDID) injection 1 mg (1 mg Intramuscular Given 09/09/20 1116)    ED Course  I have reviewed the triage vital signs and the nursing notes.  Pertinent labs & imaging results that were available during my care of the patient were reviewed by me and considered in my medical decision making (see chart for details).    MDM Rules/Calculators/A&P                          MDM: Pt given his blood pressure medication.s dilaudid im.  Pt feels better.  K normal.  Pt advised to call Dr. Theador Hawthorne tomorrow and ask about dialysis   Follow up with Orthopaedist Final Clinical Impression(s) / ED Diagnoses Final diagnoses:  Acute pain of right knee    Rx / DC Orders ED Discharge Orders          Ordered    oxyCODONE-acetaminophen (PERCOCET) 5-325 MG tablet  Every 4 hours PRN        09/09/20 1121        An After Visit Summary was printed and given to the patient.    Fransico Meadow, PA-C 09/09/20 1225    Margette Fast, MD 09/12/20 [phone]

## 2020-09-09 NOTE — ED Triage Notes (Signed)
Pt arrived by [facility]. Pt fell at [facility] on Friday while there having his dialysis port placed. Pt states he has had pain that has only gotten more severe since Friday. Pt BP in triage is 229/87. Pt has not received dialysis yet and does not know his start date.

## 2020-09-10 ENCOUNTER — Telehealth (HOSPITAL_COMMUNITY): Payer: Self-pay

## 2020-09-11 ENCOUNTER — Other Ambulatory Visit: Payer: Self-pay

## 2020-09-11 ENCOUNTER — Other Ambulatory Visit (HOSPITAL_COMMUNITY)
Admission: RE | Admit: 2020-09-11 | Discharge: 2020-09-11 | Disposition: A | Discharge status: Home / Self Care | Payer: Medicare HMO | Source: Ambulatory Visit | Attending: Nephrology | Admitting: Nephrology

## 2020-09-11 DIAGNOSIS — N186 End stage renal disease: Secondary | ICD-10-CM | POA: Diagnosis not present

## 2020-09-11 DIAGNOSIS — Z992 Dependence on renal dialysis: Secondary | ICD-10-CM | POA: Diagnosis not present

## 2020-09-11 DIAGNOSIS — Z20822 Contact with and (suspected) exposure to covid-19: Secondary | ICD-10-CM | POA: Insufficient documentation

## 2020-09-11 DIAGNOSIS — E7800 Pure hypercholesterolemia, unspecified: Secondary | ICD-10-CM | POA: Diagnosis not present

## 2020-09-11 DIAGNOSIS — E119 Type 2 diabetes mellitus without complications: Secondary | ICD-10-CM | POA: Diagnosis not present

## 2020-09-11 LAB — SARS CORONAVIRUS 2 BY RT PCR (HOSPITAL ORDER, PERFORMED IN ~~LOC~~ HOSPITAL LAB): SARS Coronavirus 2: NEGATIVE

## 2020-09-12 DIAGNOSIS — M25561 Pain in right knee: Secondary | ICD-10-CM | POA: Diagnosis not present

## 2020-09-13 DIAGNOSIS — K635 Polyp of colon: Secondary | ICD-10-CM | POA: Insufficient documentation

## 2020-09-13 DIAGNOSIS — E782 Mixed hyperlipidemia: Secondary | ICD-10-CM | POA: Insufficient documentation

## 2020-09-13 DIAGNOSIS — K219 Gastro-esophageal reflux disease without esophagitis: Secondary | ICD-10-CM | POA: Insufficient documentation

## 2020-09-13 DIAGNOSIS — N186 End stage renal disease: Secondary | ICD-10-CM | POA: Diagnosis not present

## 2020-09-13 DIAGNOSIS — D649 Anemia, unspecified: Secondary | ICD-10-CM | POA: Insufficient documentation

## 2020-09-13 DIAGNOSIS — R809 Proteinuria, unspecified: Secondary | ICD-10-CM | POA: Insufficient documentation

## 2020-09-13 DIAGNOSIS — I10 Essential (primary) hypertension: Secondary | ICD-10-CM | POA: Insufficient documentation

## 2020-09-13 DIAGNOSIS — G629 Polyneuropathy, unspecified: Secondary | ICD-10-CM | POA: Insufficient documentation

## 2020-09-13 DIAGNOSIS — E785 Hyperlipidemia, unspecified: Secondary | ICD-10-CM | POA: Insufficient documentation

## 2020-09-13 DIAGNOSIS — R609 Edema, unspecified: Secondary | ICD-10-CM | POA: Insufficient documentation

## 2020-09-13 DIAGNOSIS — Z9289 Personal history of other medical treatment: Secondary | ICD-10-CM | POA: Insufficient documentation

## 2020-09-13 DIAGNOSIS — Z992 Dependence on renal dialysis: Secondary | ICD-10-CM | POA: Insufficient documentation

## 2020-09-16 DIAGNOSIS — N186 End stage renal disease: Secondary | ICD-10-CM | POA: Diagnosis not present

## 2020-09-16 DIAGNOSIS — Z992 Dependence on renal dialysis: Secondary | ICD-10-CM | POA: Diagnosis not present

## 2020-09-18 DIAGNOSIS — Z992 Dependence on renal dialysis: Secondary | ICD-10-CM | POA: Diagnosis not present

## 2020-09-18 DIAGNOSIS — N186 End stage renal disease: Secondary | ICD-10-CM | POA: Diagnosis not present

## 2020-09-20 DIAGNOSIS — N186 End stage renal disease: Secondary | ICD-10-CM | POA: Diagnosis not present

## 2020-09-20 DIAGNOSIS — Z992 Dependence on renal dialysis: Secondary | ICD-10-CM | POA: Diagnosis not present

## 2020-09-23 DIAGNOSIS — N186 End stage renal disease: Secondary | ICD-10-CM | POA: Diagnosis not present

## 2020-09-23 DIAGNOSIS — Z992 Dependence on renal dialysis: Secondary | ICD-10-CM | POA: Diagnosis not present

## 2020-09-24 DIAGNOSIS — Z992 Dependence on renal dialysis: Secondary | ICD-10-CM | POA: Diagnosis not present

## 2020-09-24 DIAGNOSIS — M25561 Pain in right knee: Secondary | ICD-10-CM | POA: Diagnosis not present

## 2020-09-25 ENCOUNTER — Telehealth: Payer: Medicare HMO | Admitting: Infectious Diseases

## 2020-09-25 ENCOUNTER — Other Ambulatory Visit: Payer: Self-pay

## 2020-09-25 ENCOUNTER — Telehealth: Payer: Self-pay

## 2020-09-25 DIAGNOSIS — N186 End stage renal disease: Secondary | ICD-10-CM | POA: Diagnosis not present

## 2020-09-25 DIAGNOSIS — Z992 Dependence on renal dialysis: Secondary | ICD-10-CM | POA: Diagnosis not present

## 2020-09-25 NOTE — Telephone Encounter (Signed)
Attempted to call Mobile phone and home Phone for Virtual Visit - no answer on either number.

## 2020-09-27 DIAGNOSIS — Z992 Dependence on renal dialysis: Secondary | ICD-10-CM | POA: Diagnosis not present

## 2020-09-27 DIAGNOSIS — N186 End stage renal disease: Secondary | ICD-10-CM | POA: Diagnosis not present

## 2020-09-27 DIAGNOSIS — E119 Type 2 diabetes mellitus without complications: Secondary | ICD-10-CM | POA: Diagnosis not present

## 2020-09-30 DIAGNOSIS — N186 End stage renal disease: Secondary | ICD-10-CM | POA: Diagnosis not present

## 2020-09-30 DIAGNOSIS — Z992 Dependence on renal dialysis: Secondary | ICD-10-CM | POA: Diagnosis not present

## 2020-10-01 ENCOUNTER — Ambulatory Visit: Payer: Medicare HMO | Admitting: Orthopaedic Surgery

## 2020-10-01 ENCOUNTER — Ambulatory Visit: Payer: Medicare HMO

## 2020-10-01 ENCOUNTER — Other Ambulatory Visit: Payer: Self-pay

## 2020-10-01 ENCOUNTER — Encounter: Payer: Self-pay | Admitting: Orthopaedic Surgery

## 2020-10-01 VITALS — BP 127/63 | HR 82 | Ht 71.0 in | Wt 242.0 lb

## 2020-10-01 DIAGNOSIS — G8929 Other chronic pain: Secondary | ICD-10-CM

## 2020-10-01 DIAGNOSIS — N184 Chronic kidney disease, stage 4 (severe): Secondary | ICD-10-CM | POA: Diagnosis not present

## 2020-10-01 DIAGNOSIS — M25571 Pain in right ankle and joints of right foot: Secondary | ICD-10-CM | POA: Diagnosis not present

## 2020-10-01 DIAGNOSIS — M25561 Pain in right knee: Secondary | ICD-10-CM | POA: Diagnosis not present

## 2020-10-01 NOTE — Progress Notes (Signed)
Subjective:    Patient ID: Trevor [name], male    DOB: 03/03/1941, 80 y.o.   MRN: [id number]  HPI Trevor [name] was in the hospital recently to get a new PICC line and other procedures for his dialysis.  Trevor [name] fell in the hospital on 09-06-20 and hurt the right knee.  Trevor [name] was seen later in the ER.  I have reviewed multiple notes and the CT of the knee done on 09-06-20.  I have independently reviewed and interpreted x-rays of this patient done at another site by another physician or qualified health professional.  Trevor [name] continues to have right knee pain.  It is not getting better.  Trevor [name] has swelling and inability to walk.  Trevor [name] also has swelling and pain of the right ankle.  Trevor [name] is concerned in that is 3 weeks and Trevor [name] still hurts.    Trevor [name] was seen by Dr. Wende Neighbors on 09-27-20 and referred here.  I have reviewed Dr. Juel Burrow notes.     Review of Systems  Constitutional:  Positive for activity change.  Genitourinary:        ON dialysis  Musculoskeletal:  Positive for arthralgias, gait problem and joint swelling.  All other systems reviewed and are negative. For Review of Systems, all other systems reviewed and are negative.  The following is a summary of the past history medically, past history surgically, known current medicines, social history and family history.  This information is gathered electronically by the computer from prior information and documentation.  I review this each visit and have found including this information at this point in the chart is beneficial and informative.   Past Medical History:  Diagnosis Date   Anemia    Chronic kidney disease    Stage 4?    Diabetes mellitus without complication ([place])    Hepatitis    not sure what type - over 20 years ago   History of kidney stones    Hyperlipidemia    Hypertension     Past Surgical History:  Procedure Laterality Date   AV FISTULA PLACEMENT Left 08/09/2019   Procedure: LEFT ARM Basilic  ARTERIOVENOUS  FISTULA CREATION;  Surgeon:  Waynetta Sandy, MD;  Location: [facility];  Service: Vascular;  Laterality: Left;   Sleepy Eye Left 11/30/2019   Procedure: LEFT SECOND STAGE [facility];  Surgeon: Waynetta Sandy, MD;  Location: [facility];  Service: Vascular;  Laterality: Left;   COLONOSCOPY N/A 07/07/2017   Procedure: COLONOSCOPY;  Surgeon: Danie Binder, MD;  [facility];  Service: Endoscopy;  Laterality: N/A;  8:30   IR FLUORO GUIDE CV LINE RIGHT  09/06/2020   IR US GUIDE VASC ACCESS RIGHT  09/06/2020    Current Outpatient Medications on File Prior to Visit  Medication Sig Dispense Refill   Accu-Chek Softclix Lancets lancets      Alcohol Swabs (B-D SINGLE USE SWABS REGULAR) PADS      aspirin EC 81 MG tablet Take 81 mg by mouth daily.     Blood Glucose Monitoring Suppl (ONETOUCH VERIO) w/Device KIT 1 each by Does not apply route as needed. 1 kit 0   carvedilol (COREG) 3.125 MG tablet Take 6.25 mg by mouth 2 (two) times daily with a meal.      Cholecalciferol 25 MCG (1000 UT) tablet Take 1,000 Units by mouth daily.      cloNIDine (CATAPRES) 0.2 MG tablet Take 0.2 mg by mouth 3 (three) times daily.  furosemide (LASIX) 40 MG tablet Take 40 mg by mouth 2 (two) times daily as needed.     Garlic 1245 MG CAPS Take 1,000 mg by mouth daily.     glucose blood (ONETOUCH VERIO) test strip Use as instructed 300 each 2   hydrALAZINE (APRESOLINE) 25 MG tablet Take 2 tablets by mouth 3 (three) times daily.     HYDROcodone-acetaminophen (NORCO/VICODIN) 5-325 MG tablet Take 1 tablet by mouth every 4 (four) hours as needed. 6 tablet 0   isoniazid (NYDRAZID) 300 MG tablet Take 1 tablet (300 mg total) by mouth daily. 30 tablet 3   lidocaine (LIDODERM) 5 % Place 1 patch onto the skin daily. Remove & Discard patch within 12 hours or as directed by MD 30 patch 0   OVER THE COUNTER MEDICATION Take 1 tablet by mouth daily. Kidney Cleanser     oxyCODONE-acetaminophen (PERCOCET) 5-325 MG  tablet Take 1 tablet by mouth every 4 (four) hours as needed for severe pain. 16 tablet 0   pyridOXINE (VITAMIN B-6) 50 MG tablet Take 1 tablet (50 mg total) by mouth daily. 30 tablet 3   rosuvastatin (CRESTOR) 10 MG tablet Take 10 mg by mouth daily.      sodium bicarbonate 650 MG tablet Take 650 mg by mouth 3 (three) times daily.      TRESIBA FLEXTOUCH 200 UNIT/ML SOPN Inject 10 Units into the skin at bedtime.     No current facility-administered medications on file prior to visit.    Social History   Socioeconomic History   Marital status: Married    Spouse name: Not on file   Number of children: Not on file   Years of education: Not on file   Highest education level: Not on file  Occupational History   Not on file  Tobacco Use   Smoking status: Never   Smokeless tobacco: Never  Vaping Use   Vaping Use: Never used  Substance and Sexual Activity   Alcohol use: Never   Drug use: Never   Sexual activity: Not on file  Other Topics Concern   Not on file  Social History Narrative   Not on file   Social Determinants of Health   Financial Resource Strain: Not on file  Food Insecurity: Not on file  Transportation Needs: Not on file  Physical Activity: Not on file  Stress: Not on file  Social Connections: Not on file  Intimate Partner Violence: Not on file    Family History  Problem Relation Age of Onset   Diabetes Mother    Diabetes Brother    Heart attack Brother    Colon cancer Son    Lung cancer Son     BP 127/63   Pulse 82   Ht '5\' 11"'  (1.803 m)   Wt 242 lb (109.8 kg)   BMI 33.75 kg/m   Body mass index is 33.75 kg/m.     Objective:   Physical Exam Vitals and nursing note reviewed. Exam conducted with a chaperone present.  Constitutional:      Appearance: Trevor [name] is well-developed.  HENT:     Head: Normocephalic and atraumatic.  Eyes:     Conjunctiva/sclera: Conjunctivae normal.     Pupils: Pupils are equal, round, and reactive to light.   Cardiovascular:     Rate and Rhythm: Normal rate and regular rhythm.  Pulmonary:     Effort: Pulmonary effort is normal.  Abdominal:     Palpations: Abdomen is soft.  Musculoskeletal:  Cervical back: Normal range of motion and neck supple.       Legs:  Skin:    General: Skin is warm and dry.  Neurological:     Mental Status: Trevor [name] is alert and oriented to person, place, and time.     Cranial Nerves: No cranial nerve deficit.     Motor: No abnormal muscle tone.     Coordination: Coordination normal.     Deep Tendon Reflexes: Reflexes are normal and symmetric. Reflexes normal.  Psychiatric:        Behavior: Behavior normal.        Thought Content: Thought content normal.        Judgment: Judgment normal.  X-rays were done of the right ankle, reported separately.        Assessment & Plan:   Encounter Diagnoses  Name Primary?   Pain in right ankle and joints of right foot Yes   Chronic pain of right knee    Chronic kidney disease, stage IV (severe) (HCC)    His right ankle has no fracture.  I have given instructions for contrast baths.  PROCEDURE NOTE:  The patient requests injections of the right knee , verbal consent was obtained.  The right knee was prepped appropriately after time out was performed.   Sterile technique was observed and injection of 1 cc of DepoMedrol 61m with several cc's of plain xylocaine. Anesthesia was provided by ethyl chloride and a 20-gauge needle was used to inject the knee area. The injection was tolerated well.  A band aid dressing was applied.  The patient was advised to apply ice later today and tomorrow to the injection sight as needed.  Trevor [name] may need MRI of the right knee.  Return in one week.  Call if any problem.  Precautions discussed.  Electronically Signed WSanjuana Kava MD 6/21/202210:55 AM

## 2020-10-02 DIAGNOSIS — Z992 Dependence on renal dialysis: Secondary | ICD-10-CM | POA: Diagnosis not present

## 2020-10-02 DIAGNOSIS — N186 End stage renal disease: Secondary | ICD-10-CM | POA: Diagnosis not present

## 2020-10-04 DIAGNOSIS — N186 End stage renal disease: Secondary | ICD-10-CM | POA: Diagnosis not present

## 2020-10-04 DIAGNOSIS — Z992 Dependence on renal dialysis: Secondary | ICD-10-CM | POA: Diagnosis not present

## 2020-10-07 DIAGNOSIS — N186 End stage renal disease: Secondary | ICD-10-CM | POA: Diagnosis not present

## 2020-10-07 DIAGNOSIS — Z992 Dependence on renal dialysis: Secondary | ICD-10-CM | POA: Diagnosis not present

## 2020-10-08 ENCOUNTER — Emergency Department (HOSPITAL_COMMUNITY): Payer: Medicare HMO

## 2020-10-08 ENCOUNTER — Encounter: Payer: Self-pay | Admitting: Orthopaedic Surgery

## 2020-10-08 ENCOUNTER — Ambulatory Visit: Payer: Medicare HMO

## 2020-10-08 ENCOUNTER — Inpatient Hospital Stay (HOSPITAL_COMMUNITY): Payer: Medicare HMO

## 2020-10-08 ENCOUNTER — Encounter (HOSPITAL_COMMUNITY): Payer: Self-pay | Admitting: *Deleted

## 2020-10-08 ENCOUNTER — Other Ambulatory Visit: Payer: Self-pay

## 2020-10-08 ENCOUNTER — Inpatient Hospital Stay (HOSPITAL_COMMUNITY)
Admission: EM | Admit: 2020-10-08 | Discharge: 2020-10-19 | DRG: 521 | Disposition: A | Discharge status: Skilled Nursing Facility | Payer: Medicare HMO | Attending: Internal Medicine | Admitting: Internal Medicine

## 2020-10-08 ENCOUNTER — Ambulatory Visit: Payer: Medicare HMO | Admitting: Orthopaedic Surgery

## 2020-10-08 VITALS — BP 148/56 | HR 60 | Ht 71.0 in

## 2020-10-08 DIAGNOSIS — D72829 Elevated white blood cell count, unspecified: Secondary | ICD-10-CM | POA: Diagnosis not present

## 2020-10-08 DIAGNOSIS — D649 Anemia, unspecified: Secondary | ICD-10-CM | POA: Diagnosis present

## 2020-10-08 DIAGNOSIS — S72001D Fracture of unspecified part of neck of right femur, subsequent encounter for closed fracture with routine healing: Secondary | ICD-10-CM | POA: Diagnosis not present

## 2020-10-08 DIAGNOSIS — Z800 Family history of malignant neoplasm of digestive organs: Secondary | ICD-10-CM

## 2020-10-08 DIAGNOSIS — Z6833 Body mass index (BMI) 33.0-33.9, adult: Secondary | ICD-10-CM

## 2020-10-08 DIAGNOSIS — Z8249 Family history of ischemic heart disease and other diseases of the circulatory system: Secondary | ICD-10-CM

## 2020-10-08 DIAGNOSIS — W1839XA Other fall on same level, initial encounter: Secondary | ICD-10-CM | POA: Diagnosis present

## 2020-10-08 DIAGNOSIS — E876 Hypokalemia: Secondary | ICD-10-CM | POA: Diagnosis not present

## 2020-10-08 DIAGNOSIS — R600 Localized edema: Secondary | ICD-10-CM

## 2020-10-08 DIAGNOSIS — Z833 Family history of diabetes mellitus: Secondary | ICD-10-CM

## 2020-10-08 DIAGNOSIS — Z96641 Presence of right artificial hip joint: Secondary | ICD-10-CM | POA: Diagnosis not present

## 2020-10-08 DIAGNOSIS — E1122 Type 2 diabetes mellitus with diabetic chronic kidney disease: Secondary | ICD-10-CM | POA: Diagnosis not present

## 2020-10-08 DIAGNOSIS — E871 Hypo-osmolality and hyponatremia: Secondary | ICD-10-CM | POA: Diagnosis not present

## 2020-10-08 DIAGNOSIS — Z801 Family history of malignant neoplasm of trachea, bronchus and lung: Secondary | ICD-10-CM | POA: Diagnosis not present

## 2020-10-08 DIAGNOSIS — G8929 Other chronic pain: Secondary | ICD-10-CM

## 2020-10-08 DIAGNOSIS — E785 Hyperlipidemia, unspecified: Secondary | ICD-10-CM | POA: Diagnosis present

## 2020-10-08 DIAGNOSIS — Z79899 Other long term (current) drug therapy: Secondary | ICD-10-CM

## 2020-10-08 DIAGNOSIS — S72011A Unspecified intracapsular fracture of right femur, initial encounter for closed fracture: Secondary | ICD-10-CM | POA: Diagnosis not present

## 2020-10-08 DIAGNOSIS — E8779 Other fluid overload: Secondary | ICD-10-CM | POA: Diagnosis present

## 2020-10-08 DIAGNOSIS — I517 Cardiomegaly: Secondary | ICD-10-CM | POA: Diagnosis not present

## 2020-10-08 DIAGNOSIS — Z794 Long term (current) use of insulin: Secondary | ICD-10-CM

## 2020-10-08 DIAGNOSIS — K219 Gastro-esophageal reflux disease without esophagitis: Secondary | ICD-10-CM | POA: Diagnosis not present

## 2020-10-08 DIAGNOSIS — Z87442 Personal history of urinary calculi: Secondary | ICD-10-CM

## 2020-10-08 DIAGNOSIS — Z992 Dependence on renal dialysis: Secondary | ICD-10-CM | POA: Diagnosis present

## 2020-10-08 DIAGNOSIS — R739 Hyperglycemia, unspecified: Secondary | ICD-10-CM | POA: Diagnosis present

## 2020-10-08 DIAGNOSIS — D631 Anemia in chronic kidney disease: Secondary | ICD-10-CM | POA: Diagnosis not present

## 2020-10-08 DIAGNOSIS — G253 Myoclonus: Secondary | ICD-10-CM | POA: Diagnosis present

## 2020-10-08 DIAGNOSIS — N185 Chronic kidney disease, stage 5: Secondary | ICD-10-CM | POA: Diagnosis not present

## 2020-10-08 DIAGNOSIS — M25551 Pain in right hip: Secondary | ICD-10-CM

## 2020-10-08 DIAGNOSIS — T40605A Adverse effect of unspecified narcotics, initial encounter: Secondary | ICD-10-CM | POA: Diagnosis not present

## 2020-10-08 DIAGNOSIS — N186 End stage renal disease: Secondary | ICD-10-CM | POA: Diagnosis present

## 2020-10-08 DIAGNOSIS — R4182 Altered mental status, unspecified: Secondary | ICD-10-CM | POA: Diagnosis not present

## 2020-10-08 DIAGNOSIS — I120 Hypertensive chronic kidney disease with stage 5 chronic kidney disease or end stage renal disease: Secondary | ICD-10-CM | POA: Diagnosis not present

## 2020-10-08 DIAGNOSIS — R278 Other lack of coordination: Secondary | ICD-10-CM | POA: Diagnosis present

## 2020-10-08 DIAGNOSIS — G629 Polyneuropathy, unspecified: Secondary | ICD-10-CM

## 2020-10-08 DIAGNOSIS — R2689 Other abnormalities of gait and mobility: Secondary | ICD-10-CM | POA: Diagnosis not present

## 2020-10-08 DIAGNOSIS — Y92238 Other place in hospital as the place of occurrence of the external cause: Secondary | ICD-10-CM | POA: Diagnosis present

## 2020-10-08 DIAGNOSIS — Z7409 Other reduced mobility: Secondary | ICD-10-CM

## 2020-10-08 DIAGNOSIS — I10 Essential (primary) hypertension: Secondary | ICD-10-CM | POA: Diagnosis not present

## 2020-10-08 DIAGNOSIS — Z227 Latent tuberculosis: Secondary | ICD-10-CM

## 2020-10-08 DIAGNOSIS — S72041A Displaced fracture of base of neck of right femur, initial encounter for closed fracture: Secondary | ICD-10-CM | POA: Diagnosis not present

## 2020-10-08 DIAGNOSIS — M79604 Pain in right leg: Secondary | ICD-10-CM

## 2020-10-08 DIAGNOSIS — Z713 Dietary counseling and surveillance: Secondary | ICD-10-CM

## 2020-10-08 DIAGNOSIS — E8889 Other specified metabolic disorders: Secondary | ICD-10-CM | POA: Diagnosis present

## 2020-10-08 DIAGNOSIS — M25559 Pain in unspecified hip: Secondary | ICD-10-CM | POA: Diagnosis not present

## 2020-10-08 DIAGNOSIS — S72001A Fracture of unspecified part of neck of right femur, initial encounter for closed fracture: Secondary | ICD-10-CM

## 2020-10-08 DIAGNOSIS — W19XXXA Unspecified fall, initial encounter: Secondary | ICD-10-CM | POA: Diagnosis present

## 2020-10-08 DIAGNOSIS — G928 Other toxic encephalopathy: Secondary | ICD-10-CM | POA: Diagnosis not present

## 2020-10-08 DIAGNOSIS — M25561 Pain in right knee: Secondary | ICD-10-CM

## 2020-10-08 DIAGNOSIS — E872 Acidosis: Secondary | ICD-10-CM | POA: Diagnosis not present

## 2020-10-08 DIAGNOSIS — G9341 Metabolic encephalopathy: Secondary | ICD-10-CM | POA: Diagnosis not present

## 2020-10-08 DIAGNOSIS — E119 Type 2 diabetes mellitus without complications: Secondary | ICD-10-CM | POA: Diagnosis not present

## 2020-10-08 DIAGNOSIS — D62 Acute posthemorrhagic anemia: Secondary | ICD-10-CM | POA: Diagnosis not present

## 2020-10-08 DIAGNOSIS — E1142 Type 2 diabetes mellitus with diabetic polyneuropathy: Secondary | ICD-10-CM | POA: Diagnosis present

## 2020-10-08 DIAGNOSIS — M79605 Pain in left leg: Secondary | ICD-10-CM | POA: Diagnosis not present

## 2020-10-08 DIAGNOSIS — M6281 Muscle weakness (generalized): Secondary | ICD-10-CM | POA: Diagnosis not present

## 2020-10-08 DIAGNOSIS — Z20822 Contact with and (suspected) exposure to covid-19: Secondary | ICD-10-CM | POA: Diagnosis not present

## 2020-10-08 DIAGNOSIS — E1165 Type 2 diabetes mellitus with hyperglycemia: Secondary | ICD-10-CM | POA: Diagnosis present

## 2020-10-08 DIAGNOSIS — R410 Disorientation, unspecified: Secondary | ICD-10-CM | POA: Diagnosis not present

## 2020-10-08 DIAGNOSIS — E669 Obesity, unspecified: Secondary | ICD-10-CM | POA: Diagnosis not present

## 2020-10-08 DIAGNOSIS — S72009A Fracture of unspecified part of neck of unspecified femur, initial encounter for closed fracture: Secondary | ICD-10-CM | POA: Diagnosis present

## 2020-10-08 DIAGNOSIS — K5900 Constipation, unspecified: Secondary | ICD-10-CM | POA: Diagnosis not present

## 2020-10-08 DIAGNOSIS — N250 Renal osteodystrophy: Secondary | ICD-10-CM | POA: Diagnosis not present

## 2020-10-08 DIAGNOSIS — Z471 Aftercare following joint replacement surgery: Secondary | ICD-10-CM | POA: Diagnosis not present

## 2020-10-08 DIAGNOSIS — J9811 Atelectasis: Secondary | ICD-10-CM | POA: Diagnosis not present

## 2020-10-08 DIAGNOSIS — E782 Mixed hyperlipidemia: Secondary | ICD-10-CM | POA: Diagnosis present

## 2020-10-08 DIAGNOSIS — Z419 Encounter for procedure for purposes other than remedying health state, unspecified: Secondary | ICD-10-CM

## 2020-10-08 LAB — CBC WITH DIFFERENTIAL/PLATELET
Abs Immature Granulocytes: 0.03 10*3/uL (ref 0.00–0.07)
Basophils Absolute: 0 10*3/uL (ref 0.0–0.1)
Basophils Relative: 0 %
Eosinophils Absolute: 0.1 10*3/uL (ref 0.0–0.5)
Eosinophils Relative: 2 %
HCT: 31.4 % — ABNORMAL LOW (ref 39.0–52.0)
Hemoglobin: 9.9 g/dL — ABNORMAL LOW (ref 13.0–17.0)
Immature Granulocytes: 0 %
Lymphocytes Relative: 9 %
Lymphs Abs: 0.7 10*3/uL (ref 0.7–4.0)
MCH: 30.7 pg (ref 26.0–34.0)
MCHC: 31.5 g/dL (ref 30.0–36.0)
MCV: 97.5 fL (ref 80.0–100.0)
Monocytes Absolute: 0.7 10*3/uL (ref 0.1–1.0)
Monocytes Relative: 8 %
Neutro Abs: 6.3 10*3/uL (ref 1.7–7.7)
Neutrophils Relative %: 81 %
Platelets: 231 10*3/uL (ref 150–400)
RBC: 3.22 MIL/uL — ABNORMAL LOW (ref 4.22–5.81)
RDW: 16.5 % — ABNORMAL HIGH (ref 11.5–15.5)
WBC: 7.9 10*3/uL (ref 4.0–10.5)
nRBC: 0 % (ref 0.0–0.2)

## 2020-10-08 LAB — PROTIME-INR
INR: 1.1 (ref 0.8–1.2)
Prothrombin Time: 13.7 seconds (ref 11.4–15.2)

## 2020-10-08 LAB — GLUCOSE, CAPILLARY: Glucose-Capillary: 179 mg/dL — ABNORMAL HIGH (ref 70–99)

## 2020-10-08 LAB — BASIC METABOLIC PANEL
Anion gap: 8 (ref 5–15)
BUN: 44 mg/dL — ABNORMAL HIGH (ref 8–23)
CO2: 27 mmol/L (ref 22–32)
Calcium: 8.5 mg/dL — ABNORMAL LOW (ref 8.9–10.3)
Chloride: 97 mmol/L — ABNORMAL LOW (ref 98–111)
Creatinine, Ser: 4.44 mg/dL — ABNORMAL HIGH (ref 0.61–1.24)
GFR, Estimated: 13 mL/min — ABNORMAL LOW (ref >60)
Glucose, Bld: 318 mg/dL — ABNORMAL HIGH (ref 70–99)
Potassium: 3.7 mmol/L (ref 3.5–5.1)
Sodium: 132 mmol/L — ABNORMAL LOW (ref 135–145)

## 2020-10-08 LAB — CBG MONITORING, ED: Glucose-Capillary: 222 mg/dL — ABNORMAL HIGH (ref 70–99)

## 2020-10-08 LAB — RESP PANEL BY RT-PCR (FLU A&B, COVID) ARPGX2
Influenza A by PCR: NEGATIVE
Influenza B by PCR: NEGATIVE
SARS Coronavirus 2 by RT PCR: NEGATIVE

## 2020-10-08 LAB — TYPE AND SCREEN
ABO/RH(D): O POS
Antibody Screen: NEGATIVE

## 2020-10-08 LAB — APTT: aPTT: 29 seconds (ref 24–36)

## 2020-10-08 IMAGING — DX DG CHEST 1V PORT
1 series · 1 of 1 positions shown · non-contrast
Comparison: [DATE].

CLINICAL DATA: Fall.

EXAM:
PORTABLE CHEST 1 VIEW

[chest ap]
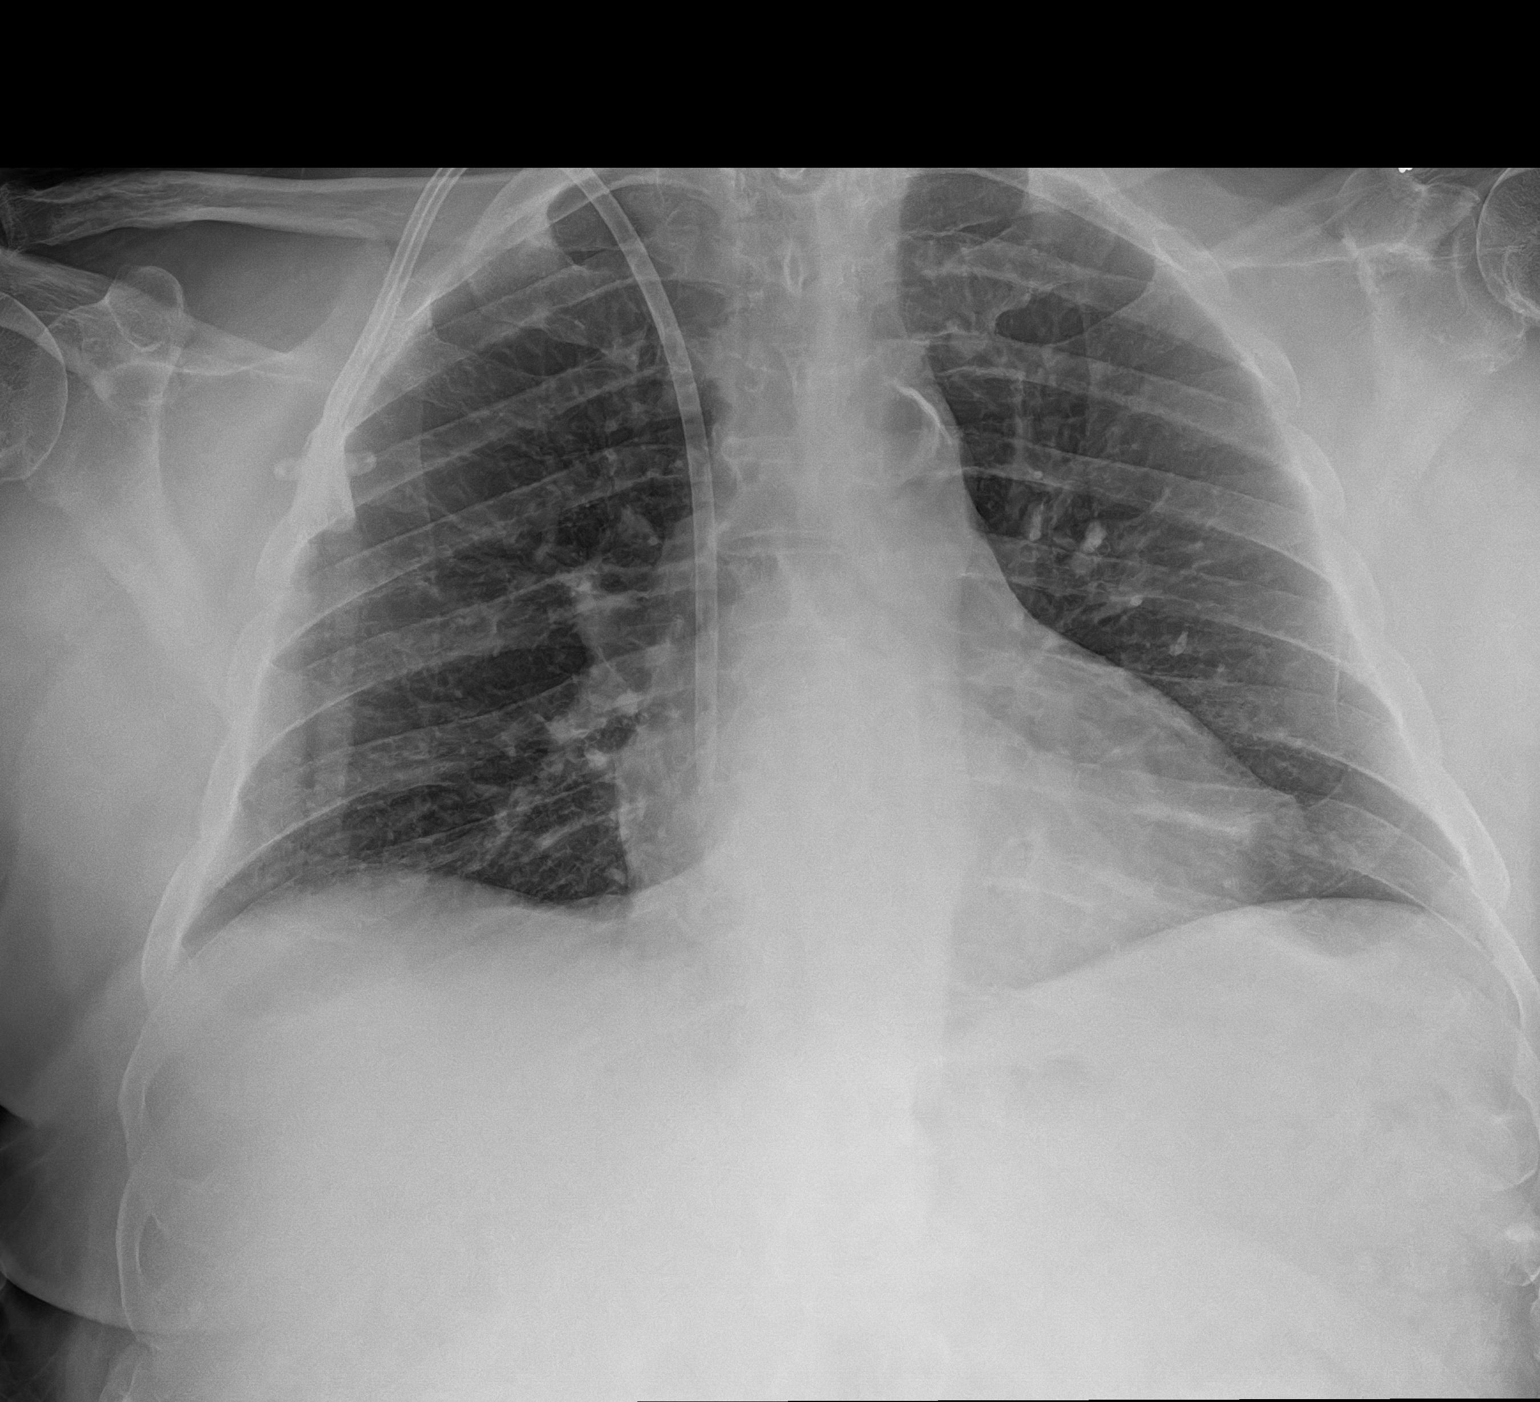

[1 of 1 positions shown; findings below may reference images not displayed]

FINDINGS: Dual-lumen catheter noted with tip over right atrium. Cardiomegaly.
No pulmonary venous congestion. Low lung volumes with mild bibasilar
atelectasis. No pleural effusion or pneumothorax. No acute bony
abnormality.
IMPRESSION: 1. Dual-lumen catheter noted with tip over right atrium. No
pulmonary venous congestion.

2.  Low lung volumes with mild bibasilar atelectasis.

## 2020-10-08 IMAGING — US US EXTREM LOW VENOUS
1 series · 13 of 24 positions shown · non-contrast
Comparison: None.

CLINICAL DATA: 80-year-old male with a history of edema



[Series 1: us venous img lower bilat (dvt) · portal-venous · 13 of 77 slices shown]
[im 1/77]
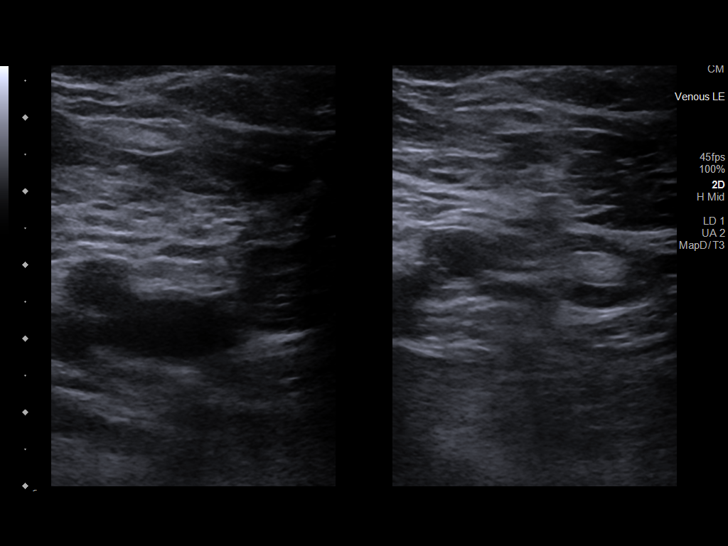
[im 7/77]
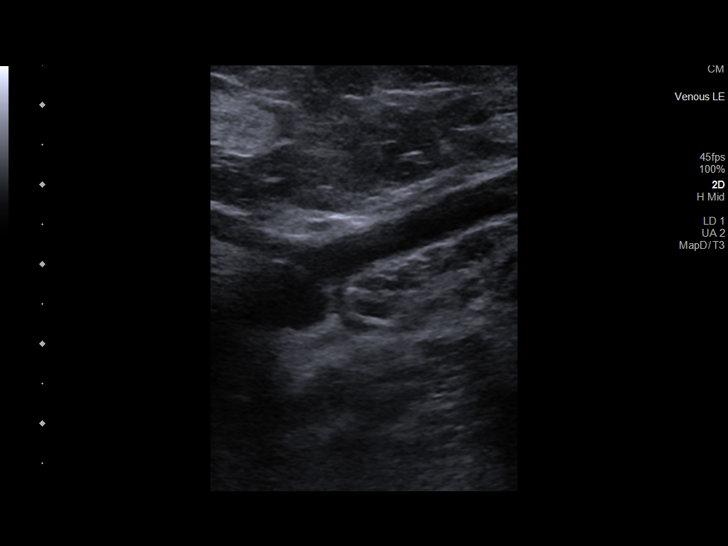
[im 14/77]
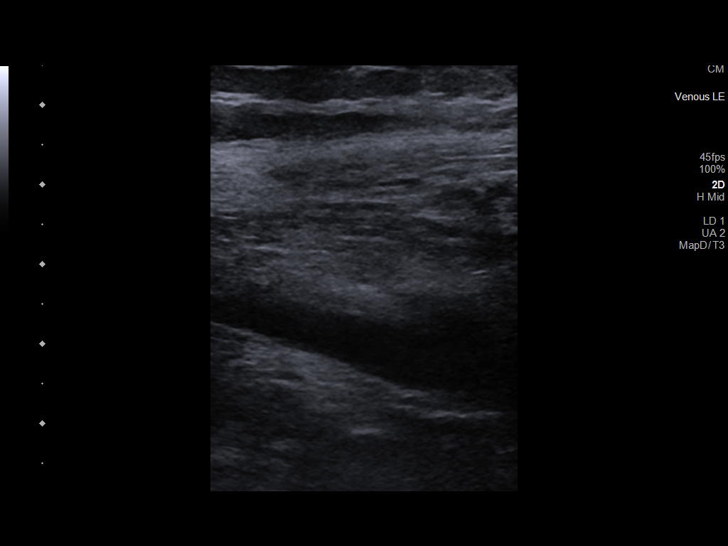
[im 20/77]
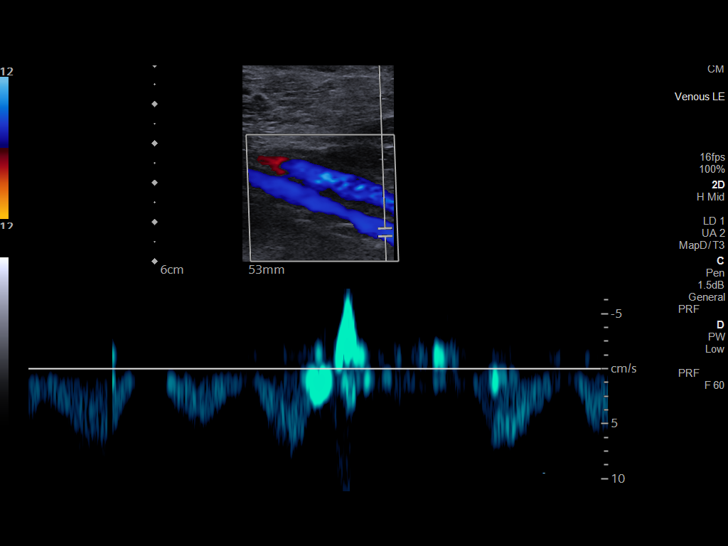
[im 27/77]
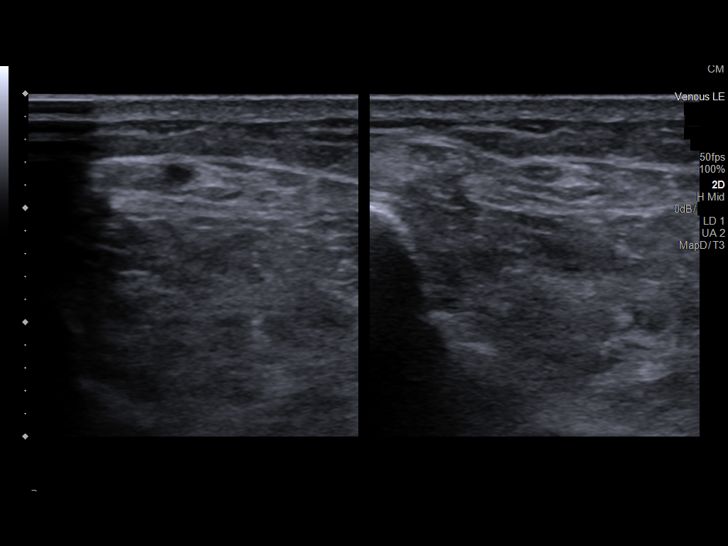
[im 34/77]
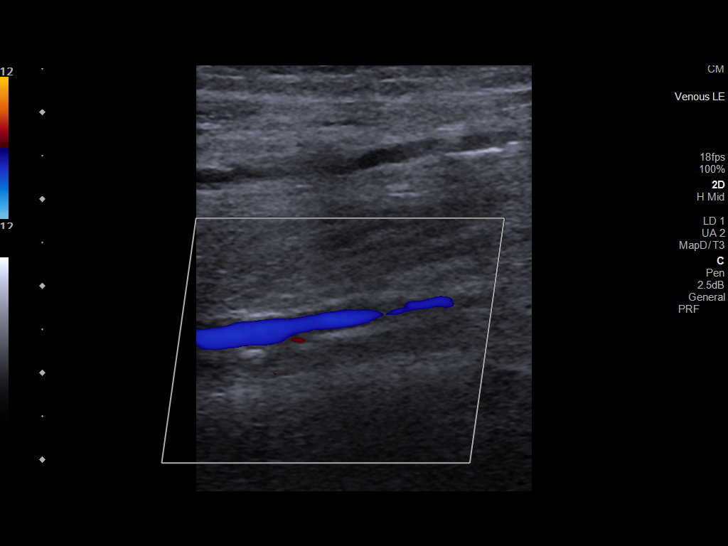
[im 40/77]
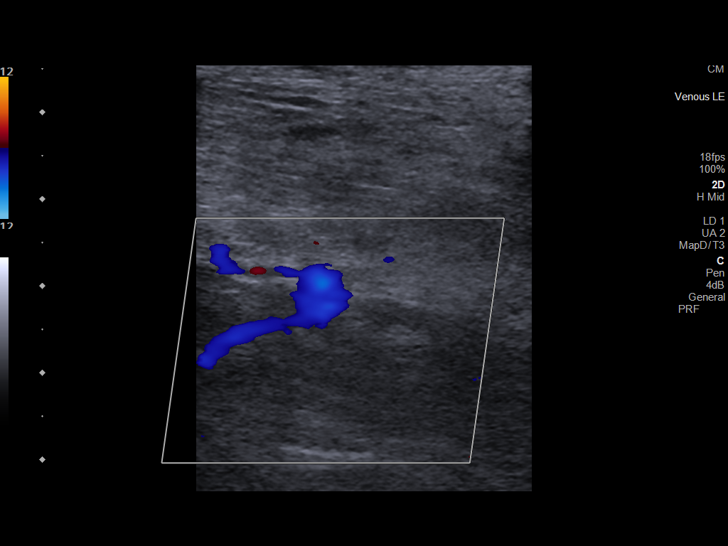
[im 43/77]
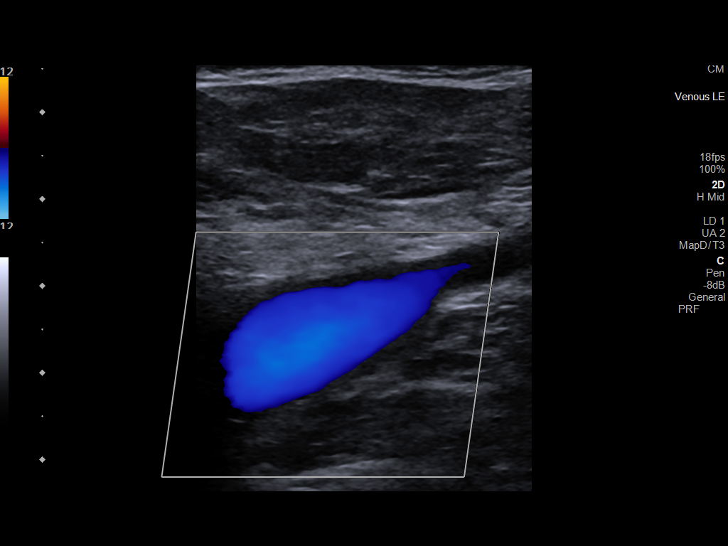
[im 50/77]
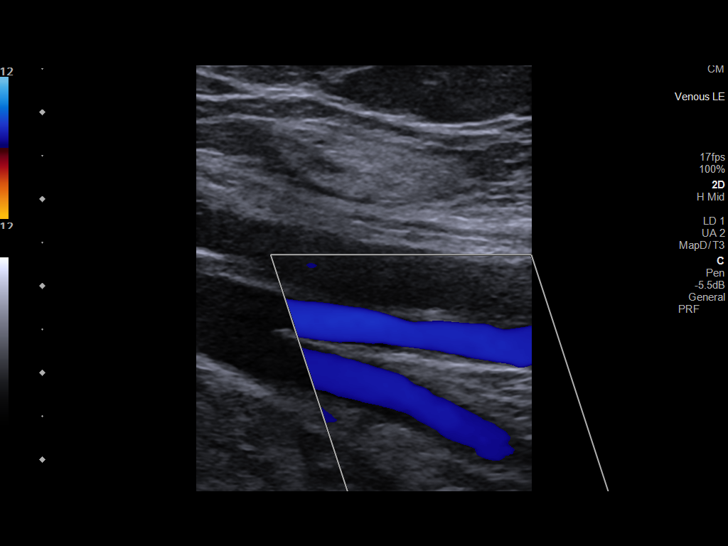
[im 57/77]
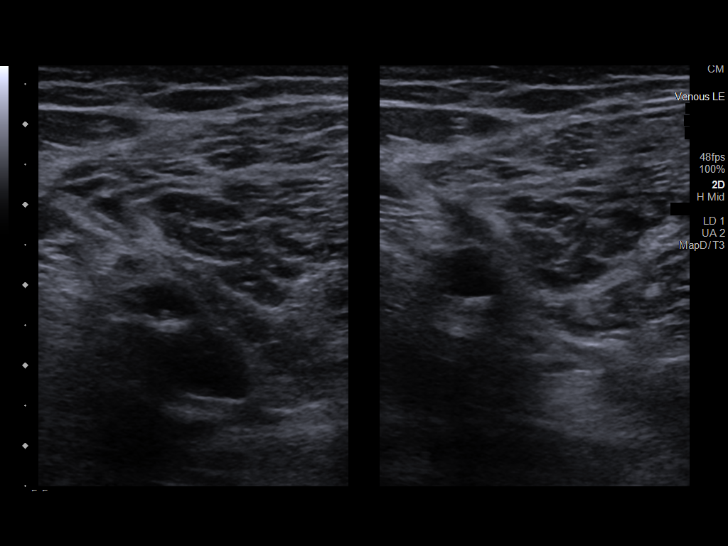
[im 63/77]
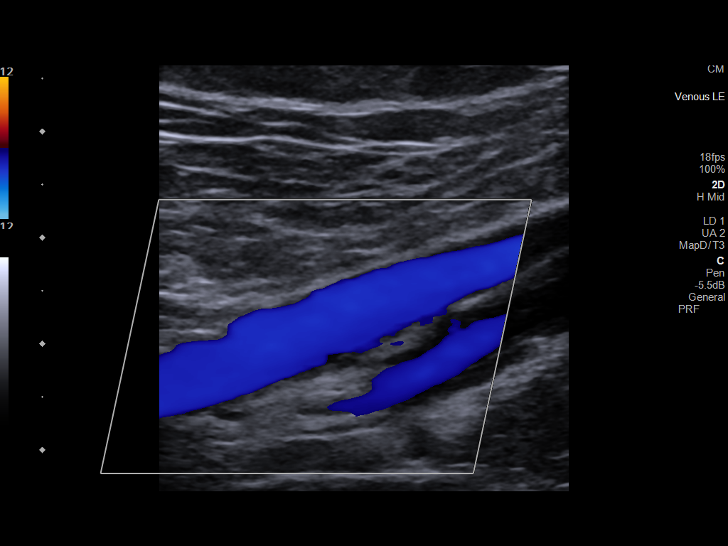
[im 70/77]
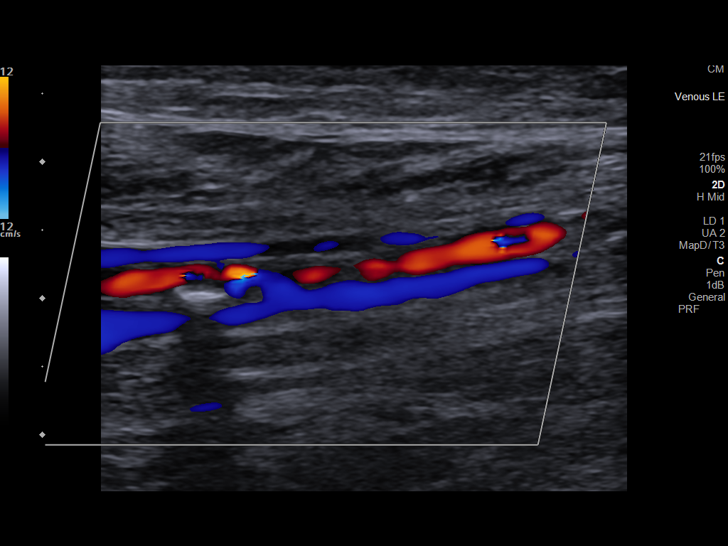
[im 77/77]
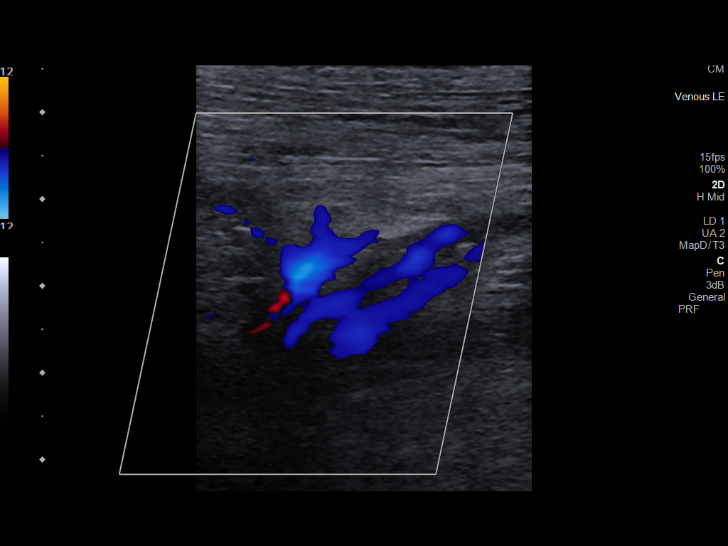

[13 of 24 positions shown; findings below may reference images not displayed]

FINDINGS: RIGHT LOWER EXTREMITY

Common Femoral Vein: No evidence of thrombus. Normal
compressibility, respiratory phasicity and response to augmentation.

Saphenofemoral Junction: No evidence of thrombus. Normal
compressibility and flow on color Doppler imaging.

Profunda Femoral Vein: No evidence of thrombus. Normal
compressibility and flow on color Doppler imaging.

Femoral Vein: No evidence of thrombus. Normal compressibility,
respiratory phasicity and response to augmentation.

Popliteal Vein: No evidence of thrombus. Normal compressibility,
respiratory phasicity and response to augmentation.

Calf Veins: No evidence of thrombus. Normal compressibility and flow
on color Doppler imaging.

Superficial Great Saphenous Vein: No evidence of thrombus. Normal
compressibility and flow on color Doppler imaging.

Other Findings:  None.

LEFT LOWER EXTREMITY

Common Femoral Vein: No evidence of thrombus. Normal
compressibility, respiratory phasicity and response to augmentation.

Saphenofemoral Junction: No evidence of thrombus. Normal
compressibility and flow on color Doppler imaging.

Profunda Femoral Vein: No evidence of thrombus. Normal
compressibility and flow on color Doppler imaging.

Femoral Vein: No evidence of thrombus. Normal compressibility,
respiratory phasicity and response to augmentation.

Popliteal Vein: No evidence of thrombus. Normal compressibility,
respiratory phasicity and response to augmentation.

Calf Veins: No evidence of thrombus. Normal compressibility and flow
on color Doppler imaging.

Superficial Great Saphenous Vein: No evidence of thrombus. Normal
compressibility and flow on color Doppler imaging

Other Findings:  None.
IMPRESSION: Sonographic survey of the left lower extremity negative for DVT

## 2020-10-08 MED ORDER — VITAMIN D 25 MCG (1000 UNIT) PO TABS
1000.0000 [IU] | ORAL_TABLET | Freq: Every day | ORAL | Status: DC
  Administered 2020-10-09 – 2020-10-19 (×11): 1000 [IU] via ORAL
  Filled 2020-10-08 (×12): qty 1

## 2020-10-08 MED ORDER — SODIUM BICARBONATE 650 MG PO TABS
650.0000 mg | ORAL_TABLET | Freq: Three times a day (TID) | ORAL | Status: DC
  Administered 2020-10-08 – 2020-10-09 (×2): 650 mg via ORAL
  Filled 2020-10-08 (×2): qty 1

## 2020-10-08 MED ORDER — POLYETHYLENE GLYCOL 3350 17 G PO PACK: 17.0000 g | PACK | Freq: Every day | ORAL | Status: DC | PRN

## 2020-10-08 MED ORDER — HYDROCODONE-ACETAMINOPHEN 5-325 MG PO TABS
1.0000 | ORAL_TABLET | Freq: Four times a day (QID) | ORAL | Status: DC | PRN
  Administered 2020-10-08 – 2020-10-09 (×2): 2 via ORAL
  Administered 2020-10-11 – 2020-10-14 (×6): 1 via ORAL
  Administered 2020-10-14: 2 via ORAL
  Administered 2020-10-14 – 2020-10-18 (×6): 1 via ORAL
  Administered 2020-10-18 (×2): 2 via ORAL
  Filled 2020-10-08: qty 1
  Filled 2020-10-08: qty 2
  Filled 2020-10-08 (×7): qty 1
  Filled 2020-10-08: qty 2
  Filled 2020-10-08: qty 1
  Filled 2020-10-08: qty 2
  Filled 2020-10-08: qty 1
  Filled 2020-10-08: qty 2
  Filled 2020-10-08: qty 1
  Filled 2020-10-08: qty 2
  Filled 2020-10-08: qty 1

## 2020-10-08 MED ORDER — HYDROMORPHONE HCL 1 MG/ML IJ SOLN
0.5000 mg | INTRAMUSCULAR | Status: DC | PRN
Start: 2020-10-08 — End: 2020-10-19
  Administered 2020-10-08 – 2020-10-09 (×3): 0.5 mg via INTRAVENOUS
  Filled 2020-10-08: qty 1
  Filled 2020-10-08: qty 0.5

## 2020-10-08 MED ORDER — INSULIN DETEMIR 100 UNIT/ML ~~LOC~~ SOLN
8.0000 [IU] | Freq: Every day | SUBCUTANEOUS | Status: DC
  Administered 2020-10-08 – 2020-10-18 (×11): 8 [IU] via SUBCUTANEOUS
  Filled 2020-10-08 (×14): qty 0.08

## 2020-10-08 MED ORDER — ROSUVASTATIN CALCIUM 5 MG PO TABS
10.0000 mg | ORAL_TABLET | Freq: Every day | ORAL | Status: DC
  Administered 2020-10-09 – 2020-10-19 (×10): 10 mg via ORAL
  Filled 2020-10-08 (×10): qty 2

## 2020-10-08 MED ORDER — HYDRALAZINE HCL 50 MG PO TABS
50.0000 mg | ORAL_TABLET | Freq: Two times a day (BID) | ORAL | Status: DC
  Administered 2020-10-08 – 2020-10-19 (×20): 50 mg via ORAL
  Filled 2020-10-08 (×21): qty 1

## 2020-10-08 MED ORDER — CARVEDILOL 6.25 MG PO TABS
6.2500 mg | ORAL_TABLET | Freq: Two times a day (BID) | ORAL | Status: DC
  Administered 2020-10-08 – 2020-10-19 (×17): 6.25 mg via ORAL
  Filled 2020-10-08 (×13): qty 1
  Filled 2020-10-08: qty 2
  Filled 2020-10-08 (×5): qty 1

## 2020-10-08 MED ORDER — HEPARIN SODIUM (PORCINE) 5000 UNIT/ML IJ SOLN
5000.0000 [IU] | Freq: Three times a day (TID) | INTRAMUSCULAR | Status: DC
  Administered 2020-10-08 (×2): 5000 [IU] via SUBCUTANEOUS
  Filled 2020-10-08 (×2): qty 1

## 2020-10-08 MED ORDER — ISONIAZID 300 MG PO TABS
300.0000 mg | ORAL_TABLET | Freq: Every day | ORAL | Status: DC
  Administered 2020-10-10 – 2020-10-19 (×10): 300 mg via ORAL
  Filled 2020-10-08 (×11): qty 1

## 2020-10-08 MED ORDER — HYDRALAZINE HCL 20 MG/ML IJ SOLN
10.0000 mg | INTRAMUSCULAR | Status: DC | PRN
  Administered 2020-10-08 – 2020-10-14 (×4): 10 mg via INTRAVENOUS
  Filled 2020-10-08 (×4): qty 1

## 2020-10-08 MED ORDER — CLONIDINE HCL 0.2 MG PO TABS
0.2000 mg | ORAL_TABLET | Freq: Three times a day (TID) | ORAL | Status: DC
  Administered 2020-10-08 – 2020-10-19 (×27): 0.2 mg via ORAL
  Filled 2020-10-08 (×29): qty 1

## 2020-10-08 MED ORDER — INSULIN ASPART 100 UNIT/ML IJ SOLN
0.0000 [IU] | Freq: Three times a day (TID) | INTRAMUSCULAR | Status: DC
  Administered 2020-10-08 – 2020-10-10 (×2): 2 [IU] via SUBCUTANEOUS
  Administered 2020-10-11: 1 [IU] via SUBCUTANEOUS
  Administered 2020-10-11 (×2): 2 [IU] via SUBCUTANEOUS
  Administered 2020-10-12: 3 [IU] via SUBCUTANEOUS
  Administered 2020-10-12 (×2): 1 [IU] via SUBCUTANEOUS
  Administered 2020-10-13 – 2020-10-14 (×4): 2 [IU] via SUBCUTANEOUS
  Administered 2020-10-14 – 2020-10-17 (×9): 1 [IU] via SUBCUTANEOUS
  Administered 2020-10-17: 3 [IU] via SUBCUTANEOUS
  Administered 2020-10-18: 2 [IU] via SUBCUTANEOUS
  Administered 2020-10-18 – 2020-10-19 (×2): 1 [IU] via SUBCUTANEOUS
  Filled 2020-10-08: qty 1

## 2020-10-08 MED ORDER — VITAMIN B-6 50 MG PO TABS
50.0000 mg | ORAL_TABLET | Freq: Every day | ORAL | Status: DC
  Administered 2020-10-10 – 2020-10-19 (×10): 50 mg via ORAL
  Filled 2020-10-08 (×11): qty 1

## 2020-10-08 NOTE — Progress Notes (Signed)
I am hurting.  My knee is worse.  He has been seen about three weeks ago complaining of knee pain on the right.  He had injection.  He has more pain and is not better.  He cannot walk.  Right knee has pain and effusion.  ROM is painful and is 0 to 100.  He has no redness. He has no distal edema.  NV intact.  PROCEDURE NOTE:  The patient requests injections of the right knee , verbal consent was obtained.  The right knee was prepped appropriately after time out was performed.   Sterile technique was observed and injection of 1 cc of DepoMedrol 40mg  with several cc's of plain xylocaine. Anesthesia was provided by ethyl chloride and a 20-gauge needle was used to inject the knee area. The injection was tolerated well.  A band aid dressing was applied.  The patient was advised to apply ice later today and tomorrow to the injection sight as needed.  As I positioned him for the knee injection, I noted that he had groin and hip pain on the right more than I would have thought.  ROM of the right hip is very painful.  He is in a wheelchair.  He fell in the hospital on May 27.  He is on dialysis.  He had new line placed then.  I got X-rays of the right hip showing a displaced femoral neck fracture.  Encounter Diagnoses  Name Primary?   Chronic pain of right knee    Pain in right hip    Closed displaced fracture of right femoral neck ([place]) Yes   I have notified Dr. Aline Brochure who is on call today.  He will see patient in the ER.  The patient is to be sent to ER from the office.  The patient is aware of the situation.  His son who is present is also aware.  Electronically Signed Sanjuana Kava, MD 6/28/202210:57 AM

## 2020-10-08 NOTE — Patient Instructions (Addendum)
To ER per Dr Aline Brochure on call for hip fracture

## 2020-10-08 NOTE — ED Provider Notes (Signed)
[facility] EMERGENCY DEPARTMENT Provider Note  CSN: [id number] Arrival date & time: 10/08/20 1056    History Chief Complaint  Patient presents with   Hip Pain     Trevor [name] is a 80 y.o. male with history of ESRD on HD MWF had a fall about a month ago while on his way to get a VasCath placed due to a failed fistula. He was just starting dialysis then. He injured his R knee at that fall and had xrays which were neg. He continued to have pain and swelling to the knee, unable to stand or walk. Patient went to Ortho clinic today for a knee injection and while there had hip xrays showing a femoral neck fracture. Patient reports some hip pain but his primary complaint has been knee pain throughout. He was sent to the ED for admission.    Past Medical History:  Diagnosis Date   Anemia    Chronic kidney disease    Stage 4?    Diabetes mellitus without complication ([place])    Hepatitis    not sure what type - over 20 years ago   History of kidney stones    Hyperlipidemia    Hypertension     Past Surgical History:  Procedure Laterality Date   AV FISTULA PLACEMENT Left 08/09/2019   Procedure: LEFT ARM Basilic  ARTERIOVENOUS  FISTULA CREATION;  Surgeon: Waynetta Sandy, MD;  Location: [place];  Service: Vascular;  Laterality: Left;   [place] Left 11/30/2019   Procedure: LEFT SECOND STAGE [place];  Surgeon: Waynetta Sandy, MD;  Location: [place];  Service: Vascular;  Laterality: Left;   COLONOSCOPY N/A 07/07/2017   Procedure: COLONOSCOPY;  Surgeon: Danie Binder, MD;  [facility];  Service: Endoscopy;  Laterality: N/A;  8:30   IR FLUORO GUIDE CV LINE RIGHT  09/06/2020   IR US GUIDE VASC ACCESS RIGHT  09/06/2020    Family History  Problem Relation Age of Onset   Diabetes Mother    Diabetes Brother    Heart attack Brother    Colon cancer Son    Lung cancer Son     Social History   Tobacco Use   Smoking  status: Never   Smokeless tobacco: Never  Vaping Use   Vaping Use: Never used  Substance Use Topics   Alcohol use: Never   Drug use: Never     Home Medications Prior to Admission medications   Medication Sig Start Date End Date Taking? Authorizing Provider  acetaminophen (TYLENOL) 500 MG tablet Take 1,000 mg by mouth every 6 (six) hours as needed for moderate pain.   Yes [provider]  carvedilol (COREG) 3.125 MG tablet Take 6.25 mg by mouth 2 (two) times daily with a meal.  10/18/19 10/17/20 Yes [provider]  Cholecalciferol 25 MCG (1000 UT) tablet Take 1,000 Units by mouth daily.  12/23/09  Yes [provider]  cloNIDine (CATAPRES) 0.2 MG tablet Take 0.2 mg by mouth 3 (three) times daily.  04/12/17  Yes [provider]  hydrALAZINE (APRESOLINE) 25 MG tablet Take 2 tablets by mouth 2 (two) times daily. 07/03/20  Yes [provider]  isoniazid (NYDRAZID) 300 MG tablet Take 1 tablet (300 mg total) by mouth daily. 07/25/20  Yes Rosiland Oz, MD  pyridOXINE (VITAMIN B-6) 50 MG tablet Take 1 tablet (50 mg total) by mouth daily. 07/25/20  Yes Rosiland Oz, MD  rosuvastatin (CRESTOR) 10 MG tablet  Take 10 mg by mouth daily.  01/26/17  Yes [provider]  sodium bicarbonate 650 MG tablet Take 650 mg by mouth 3 (three) times daily.  07/24/19  Yes [provider]  TRESIBA FLEXTOUCH 200 UNIT/ML SOPN Inject 10 Units into the skin at bedtime. If sugar is 100 or over take 10 units 04/15/17  Yes [provider]  Accu-Chek Softclix Lancets lancets  09/13/19   [provider]  Alcohol Swabs (B-D SINGLE USE SWABS REGULAR) PADS  08/11/19   [provider]  Blood Glucose Monitoring Suppl (ONETOUCH VERIO) w/Device KIT 1 each by Does not apply route as needed. 05/03/20   Cassandria Anger, MD  furosemide (LASIX) 40 MG tablet Take 40 mg by mouth 2 (two) times daily as needed. Patient not taking: Reported on 10/08/2020  07/14/19 11/20/19  [provider]  glucose blood (ONETOUCH VERIO) test strip Use as instructed 05/03/20   Cassandria Anger, MD  HYDROcodone-acetaminophen (NORCO/VICODIN) 5-325 MG tablet Take 1 tablet by mouth every 4 (four) hours as needed. Patient not taking: No sig reported 04/30/20   Henderly, Britni A, PA-C     Allergies    Patient has no known allergies.   Review of Systems   Review of Systems A comprehensive review of systems was completed and negative except as noted in HPI.    Physical Exam BP (!) 156/65 (BP Location: Right Arm)   Pulse 77   Temp 97.6 F (36.4 C) (Oral)   Resp 16   Ht _0  (1.803 m)   Wt 109.8 kg   SpO2 97%   BMI 33.75 kg/m   Physical Exam Vitals and nursing note reviewed.  Constitutional:      Appearance: Normal appearance.  HENT:     Head: Normocephalic and atraumatic.     Nose: Nose normal.     Mouth/Throat:     Mouth: Mucous membranes are moist.  Eyes:     Extraocular Movements: Extraocular movements intact.     Conjunctiva/sclera: Conjunctivae normal.  Cardiovascular:     Rate and Rhythm: Normal rate.  Pulmonary:     Effort: Pulmonary effort is normal.     Breath sounds: Normal breath sounds.  Abdominal:     General: Abdomen is flat.     Palpations: Abdomen is soft.     Tenderness: There is no abdominal tenderness.  Musculoskeletal:        General: Swelling and tenderness (R knee) present. Normal range of motion.     Cervical back: Neck supple.     Comments: R leg is externally rotated  Skin:    General: Skin is warm and dry.  Neurological:     General: No focal deficit present.     Mental Status: He is alert.  Psychiatric:        Mood and Affect: Mood normal.     ED Results / Procedures / Treatments   Labs (all labs ordered are listed, but only abnormal results are displayed) Labs Reviewed  BASIC METABOLIC PANEL - Abnormal; Notable for the following components:      Result Value   Sodium 132 (*)     Chloride 97 (*)    Glucose, Bld 318 (*)    BUN 44 (*)    Creatinine, Ser 4.44 (*)    Calcium 8.5 (*)    GFR, Estimated 13 (*)    All other components within normal limits  CBC WITH DIFFERENTIAL/PLATELET - Abnormal; Notable for the following components:  RBC 3.22 (*)    Hemoglobin 9.9 (*)    HCT 31.4 (*)    RDW 16.5 (*)    All other components within normal limits  GLUCOSE, CAPILLARY - Abnormal; Notable for the following components:   Glucose-Capillary 179 (*)    All other components within normal limits  GLUCOSE, CAPILLARY - Abnormal; Notable for the following components:   Glucose-Capillary 202 (*)    All other components within normal limits  RENAL FUNCTION PANEL - Abnormal; Notable for the following components:   Sodium 133 (*)    Glucose, Bld 219 (*)    BUN 51 (*)    Creatinine, Ser 5.05 (*)    Phosphorus 4.7 (*)    Albumin 2.8 (*)    GFR, Estimated 11 (*)    All other components within normal limits  CBG MONITORING, ED - Abnormal; Notable for the following components:   Glucose-Capillary 222 (*)    All other components within normal limits  RESP PANEL BY RT-PCR (FLU A&B, COVID) [id number]  SURGICAL PCR SCREEN  PROTIME-INR  APTT  HEMOGLOBIN A1C  TYPE AND SCREEN    EKG EKG Interpretation  Date/Time:  Tuesday October 08 2020 12:38:18 EDT Ventricular Rate:  68 PR Interval:  404 QRS Duration: 126 QT Interval:  415 QTC Calculation: 442 R Axis:   11 Text Interpretation: Sinus rhythm Artifact Ventricular premature complex Aberrant conduction of SV complex(es) Prolonged PR interval LVH with IVCD and secondary repol abnrm Anterior ST elevation, probably due to LVH No significant change since last tracing Confirmed by Calvert Cantor [phone]) on 10/08/2020 12:49:05 PM  Radiology US Venous Img Lower Bilateral (DVT)  Result Date: 10/08/2020 CLINICAL DATA:  80 year old male with a history of edema EXAM: BILATERAL LOWER EXTREMITY VENOUS DOPPLER ULTRASOUND TECHNIQUE: Gray-scale  sonography with graded compression, as well as color Doppler and duplex ultrasound were performed to evaluate the lower extremity deep venous systems from the level of the common femoral vein and including the common femoral, femoral, profunda femoral, popliteal and calf veins including the posterior tibial, peroneal and gastrocnemius veins when visible. The superficial great saphenous vein was also interrogated. Spectral Doppler was utilized to evaluate flow at rest and with distal augmentation maneuvers in the common femoral, femoral and popliteal veins. COMPARISON:  None. FINDINGS: RIGHT LOWER EXTREMITY Common Femoral Vein: No evidence of thrombus. Normal compressibility, respiratory phasicity and response to augmentation. Saphenofemoral Junction: No evidence of thrombus. Normal compressibility and flow on color Doppler imaging. Profunda Femoral Vein: No evidence of thrombus. Normal compressibility and flow on color Doppler imaging. Femoral Vein: No evidence of thrombus. Normal compressibility, respiratory phasicity and response to augmentation. Popliteal Vein: No evidence of thrombus. Normal compressibility, respiratory phasicity and response to augmentation. Calf Veins: No evidence of thrombus. Normal compressibility and flow on color Doppler imaging. Superficial Great Saphenous Vein: No evidence of thrombus. Normal compressibility and flow on color Doppler imaging. Other Findings:  None. LEFT LOWER EXTREMITY Common Femoral Vein: No evidence of thrombus. Normal compressibility, respiratory phasicity and response to augmentation. Saphenofemoral Junction: No evidence of thrombus. Normal compressibility and flow on color Doppler imaging. Profunda Femoral Vein: No evidence of thrombus. Normal compressibility and flow on color Doppler imaging. Femoral Vein: No evidence of thrombus. Normal compressibility, respiratory phasicity and response to augmentation. Popliteal Vein: No evidence of thrombus. Normal  compressibility, respiratory phasicity and response to augmentation. Calf Veins: No evidence of thrombus. Normal compressibility and flow on color Doppler imaging. Superficial Great Saphenous Vein: No evidence of thrombus. Normal  compressibility and flow on color Doppler imaging Other Findings:  None. IMPRESSION: Sonographic survey of the left lower extremity negative for DVT Electronically Signed   By: Corrie Mckusick D.O.   On: 10/08/2020 14:55   DG Chest Port 1 View  Result Date: 10/08/2020 CLINICAL DATA:  Fall. EXAM: PORTABLE CHEST 1 VIEW COMPARISON:  08/13/2020. FINDINGS: Dual-lumen catheter noted with tip over right atrium. Cardiomegaly. No pulmonary venous congestion. Low lung volumes with mild bibasilar atelectasis. No pleural effusion or pneumothorax. No acute bony abnormality. IMPRESSION: 1. Dual-lumen catheter noted with tip over right atrium. No pulmonary venous congestion. 2.  Low lung volumes with mild bibasilar atelectasis. Electronically Signed   By: Marcello Moores  Register   On: 10/08/2020 13:18   DG HIP UNILAT W OR W/O PELVIS 2-3 VIEWS RIGHT  Result Date: 10/08/2020 Clinical:  Fall May 27, hip pain X-rays were done of the right hip and pelvis. There is a displaced femoral neck fracture of the right hip with some foreshortening.  Bone quality is good.  Femoral head well located within acetabulum. Impression:  Displaced femoral neck fracture of the right hip. Electronically Chadron, MD 6/28/20229:40 AM    Procedures Procedures  Medications Ordered in the ED Medications  hydrALAZINE (APRESOLINE) injection 10 mg (10 mg Intravenous Given 10/08/20 1416)  HYDROcodone-acetaminophen (NORCO/VICODIN) 5-325 MG per tablet 1-2 tablet (2 tablets Oral Given 10/08/20 1447)  heparin injection 5,000 Units (5,000 Units Subcutaneous Not Given 10/09/20 0531)  HYDROmorphone (DILAUDID) injection 0.5 mg (0.5 mg Intravenous Given 10/08/20 2013)  polyethylene glycol (MIRALAX / GLYCOLAX) packet 17 g (has no  administration in time range)  insulin aspart (novoLOG) injection 0-6 Units (2 Units Subcutaneous Given 10/08/20 1736)  carvedilol (COREG) tablet 6.25 mg (6.25 mg Oral Given 10/08/20 1719)  cholecalciferol (VITAMIN D3) tablet 1,000 Units (1,000 Units Oral Not Given 10/08/20 1753)  cloNIDine (CATAPRES) tablet 0.2 mg (0.2 mg Oral Given 10/08/20 2148)  hydrALAZINE (APRESOLINE) tablet 50 mg (50 mg Oral Given 10/08/20 2148)  isoniazid (NYDRAZID) tablet 300 mg (has no administration in time range)  pyridOXINE (VITAMIN B-6) tablet 50 mg (has no administration in time range)  rosuvastatin (CRESTOR) tablet 10 mg (has no administration in time range)  sodium bicarbonate tablet 650 mg (650 mg Oral Given 10/08/20 2148)  insulin detemir (LEVEMIR) injection 8 Units (8 Units Subcutaneous Given 10/08/20 2319)     MDM Rules/Calculators/A&P MDM  Xray reviewed, shows a hip fracture. Spoke with Dr. Aline Brochure, on call for Ortho who requests the patient be transferred to [facility] for Medical Reasons. Will order pre-op lab evaluation and discuss with Hospitalist.   ED Course  I have reviewed the triage vital signs and the nursing notes.  Pertinent labs & imaging results that were available during my care of the patient were reviewed by me and considered in my medical decision making (see chart for details).  Clinical Course as of 10/09/20 0656  Tue Oct 08, 2020  1303 CBC with mild anemia. BMP consistent with ESRD. Will discuss with hospitalist. Orion Crook, PA with Ortho at [facility] is aware.  [CS]  2841 Coags are neg.  [CS]  3244 Spoke with Dr. Wynetta Emery, hospitalist who will evaluate and begin process of admission to [facility].  [CS]    Clinical Course User Index [CS] Truddie Hidden, MD    Final Clinical Impression(s) / ED Diagnoses Final diagnoses:  Closed right hip fracture, initial encounter [facility])    Rx / DC Orders ED Discharge Orders  None        Truddie Hidden, MD 10/09/20 [phone]

## 2020-10-08 NOTE — H&P (Signed)
History and Physical    BAIRON KLEMANN AXE:[id number] DOB: 1940/06/19 DOA: 10/08/2020  Referring MD/NP/PA: Karle Starch PCP: Celene Squibb, MD  Outpatient Specialists: Aline Brochure (ortho), Theador Hawthorne (nephrology)  Chief Complaint: Hip pain; unable to ambulate  HPI:  Trevor [name] is a 80 yo male with pertinent medical history of ESRD on dialysis and controlled DM presenting to the ED with hip pain and inability to ambulate x1 month. Patient went to [facility] to get hemodialysis port replacement but felt dizzy and fell. At [facility] ED patient received a knee xray which revealed no injury. After port replacement and discharge, patient continued to experience severe R hip pain rendering him unable to walk for a month. He saw Dr. Sanjuana Kava of orthopedic surgery today who ordered a hip XR which revealed displaced femoral neck fracture. Patient needs surgery and will be transferred to [facility] for complex anesthesia with dialysis. Patient received dialysis three times a week.   ED Course: At ED, patient had BP up to 206/124. Hydralazine 86m q4h PRN was prescribed. BP now improved to 130/91. Initial labs including CBC, CMP, POC glucose, PT/INR consistent with baseline. Glucose 318. Venous doppler [place] obtained and was negative for DVT. Orthopedist Dr. HAline Brochurewas consulted and [facility] Gunnerthe ortho PA-[facility] and will see patient [facility] B[facility] Sickswas consulted and [facility]    Review of Systems:  Constitutional: Negative for chills and fever. Leg pain as per HPI. HENT: Negative for ear pain and sore throat.   Eyes: Negative for pain and visual disturbance. Respiratory: Negative for cough and shortness of breath.   Cardiovascular: Negative for chest pain and palpitations. Gastrointestinal: Negative for abdominal pain and vomiting. Genitourinary: Negative for dysuria and  hematuria. Musculoskeletal: Negative for arthralgias and back pain. Skin: Negative for color change and rash. Neurological: Negative for seizures and syncope.  All other systems reviewed and are negative.  Past Medical History:  Diagnosis Date   Anemia    Chronic kidney disease    Stage 4?    Diabetes mellitus without complication ([place]    Hepatitis    not sure what type - over 20 years ago   History of kidney stones    Hyperlipidemia    Hypertension     Past Surgical History:  Procedure Laterality Date   AV FISTULA PLACEMENT Left 08/09/2019   Procedure: LEFT ARM Basilic  ARTERIOVENOUS  FISTULA CREATION;  Surgeon: CWaynetta Sandy MD;  [facility] [place]  Service: Vascular;  Laterality: Left;   [place] 11/[place]   Procedure: LEFT SECOND STAGE [place]  Surgeon: CWaynetta Sandy MD;  [place] [place]  Service: Vascular;  Laterality: Left;   COLONOSCOPY N/A 07/07/2017   Procedure: COLONOSCOPY;  Surgeon: FDanie Binder MD;  [facility];  Service: Endoscopy;  Laterality: N/A;  8:30   IR FLUORO GUIDE CV LINE RIGHT  09/06/2020   IR [place] VASC ACCESS RIGHT  09/06/2020     reports that he has never smoked. He has never used smokeless tobacco. He reports that he does not drink alcohol and does not use drugs.  No Known Allergies  Family History  Problem Relation Age of Onset   Diabetes Mother    Diabetes Brother    Heart attack Brother    Colon cancer Son    Lung cancer Son  Prior to Admission medications   Medication Sig Start Date End Date Taking? Authorizing Provider  Accu-Chek Softclix Lancets lancets  09/13/19   [provider]  Alcohol Swabs (B-D SINGLE USE SWABS REGULAR) PADS  08/11/19   [provider]  aspirin EC 81 MG tablet Take 81 mg by mouth daily.    [provider]  Blood Glucose Monitoring Suppl (ONETOUCH VERIO) w/Device KIT 1 each by Does not apply route as  needed. 05/03/20   Cassandria Anger, MD  carvedilol (COREG) 3.125 MG tablet Take 6.25 mg by mouth 2 (two) times daily with a meal.  10/18/19 10/17/20  [provider]  Cholecalciferol 25 MCG (1000 UT) tablet Take 1,000 Units by mouth daily.  12/23/09   [provider]  cloNIDine (CATAPRES) 0.2 MG tablet Take 0.2 mg by mouth 3 (three) times daily.  04/12/17   [provider]  furosemide (LASIX) 40 MG tablet Take 40 mg by mouth 2 (two) times daily as needed. 07/14/19 11/20/19  [provider]  Garlic 4709 MG CAPS Take 1,000 mg by mouth daily.    [provider]  glucose blood (ONETOUCH VERIO) test strip Use as instructed 05/03/20   Cassandria Anger, MD  hydrALAZINE (APRESOLINE) 25 MG tablet Take 2 tablets by mouth 3 (three) times daily. 07/03/20   [provider]  HYDROcodone-acetaminophen (NORCO/VICODIN) 5-325 MG tablet Take 1 tablet by mouth every 4 (four) hours as needed. 04/30/20   Henderly, Britni A, PA-C  isoniazid (NYDRAZID) 300 MG tablet Take 1 tablet (300 mg total) by mouth daily. 07/25/20   Rosiland Oz, MD  lidocaine (LIDODERM) 5 % Place 1 patch onto the skin daily. Remove & Discard patch within 12 hours or as directed by MD 04/30/20   Henderly, Britni A, PA-C  OVER THE COUNTER MEDICATION Take 1 tablet by mouth daily. Kidney Cleanser    [provider]  oxyCODONE-acetaminophen (PERCOCET) 5-325 MG tablet Take 1 tablet by mouth every 4 (four) hours as needed for severe pain. 09/09/20 09/09/21  Fransico Meadow, PA-C  pyridOXINE (VITAMIN B-6) 50 MG tablet Take 1 tablet (50 mg total) by mouth daily. 07/25/20   Rosiland Oz, MD  rosuvastatin (CRESTOR) 10 MG tablet Take 10 mg by mouth daily.  01/26/17   [provider]  sodium bicarbonate 650 MG tablet Take 650 mg by mouth 3 (three) times daily.  07/24/19   [provider]  TRESIBA FLEXTOUCH 200 UNIT/ML SOPN Inject 10 Units into the skin at bedtime. 04/15/17   [provider]    Physical Exam: Vitals:   10/08/20 1330 10/08/20 1416 10/08/20 1424 10/08/20 1454  BP: (!) 181/128 (!) 191/145 (!) 148/95 (!) 130/91  Pulse: (!) 57  74   Resp: _0 Temp:      SpO2: 96%  98%     Vitals:   10/08/20 1330 10/08/20 1416 10/08/20 1424 10/08/20 1454  BP: (!) 181/128 (!) 191/145 (!) 148/95 (!) 130/91  Pulse: (!) 57  74   Resp: _1 Temp:      SpO2: 96%  98%    Constitutional: NAD, calm, comfortable, well-developed Eyes: PERRL, lids and conjunctivae normal ENMT: Mucous membranes are moist. Posterior pharynx clear of any exudate or lesions.Normal dentition.  Neck: normal, supple, no masses, no thyromegaly Respiratory: clear to auscultation bilaterally, no wheezing, no crackles. Normal respiratory effort. No accessory muscle use.  Cardiovascular: Regular rate and rhythm, no murmurs / rubs /  gallops. No extremity edema. No carotid bruits. No edema or increased circumference of lower limbs seen. 2+ pedal pulses.   Abdomen: no tenderness, no masses palpated. No hepatosplenomegaly. Bowel sounds positive.  Musculoskeletal: no clubbing / cyanosis. Right leg is externally rotated and shortened. Unable to move right hip joint.  no contractures. Normal muscle tone. Warm lower extremities with good pulses in feet palpated. Skin: PermCath clean and functioning appropriately.  Neurologic: CN 2-12 grossly intact. Sensation intact, DTR normal. Strength 5/5 in all 4.  Psychiatric: Normal judgment and insight. Alert and oriented x 3. Normal mood.    Labs on Admission: I have personally reviewed following labs and imaging studies  CBC: Recent Labs  Lab 10/08/20 1224  WBC 7.9  NEUTROABS 6.3  HGB 9.9*  HCT 31.4*  MCV 97.5  PLT 678   Basic Metabolic Panel: Recent Labs  Lab 10/08/20 1224  NA 132*  K 3.7  CL 97*  CO2 27  GLUCOSE 318*  BUN 44*  CREATININE 4.44*  CALCIUM 8.5*   GFR: Estimated Creatinine Clearance: 17 mL/min (A) (by C-G formula  based on SCr of 4.44 mg/dL (H)). Liver Function Tests: No results for input(s): AST, ALT, ALKPHOS, BILITOT, PROT, ALBUMIN in the last 168 hours. No results for input(s): LIPASE, AMYLASE in the last 168 hours. No results for input(s): AMMONIA in the last 168 hours. Coagulation Profile: Recent Labs  Lab 10/08/20 1224  INR 1.1   Cardiac Enzymes: No results for input(s): CKTOTAL, CKMB, CKMBINDEX, TROPONINI in the last 168 hours. BNP (last 3 results) No results for input(s): PROBNP in the last 8760 hours. HbA1C: No results for input(s): HGBA1C in the last 72 hours. CBG: No results for input(s): GLUCAP in the last 168 hours. Lipid Profile: No results for input(s): CHOL, HDL, LDLCALC, TRIG, CHOLHDL, LDLDIRECT in the last 72 hours. Thyroid Function Tests: No results for input(s): TSH, T4TOTAL, FREET4, T3FREE, THYROIDAB in the last 72 hours. Anemia Panel: No results for input(s): VITAMINB12, FOLATE, FERRITIN, TIBC, IRON, RETICCTPCT in the last 72 hours. Urine analysis:    Component Value Date/Time   COLORURINE YELLOW 09/06/2020 1003   APPEARANCEUR CLEAR 09/06/2020 1003   LABSPEC 1.011 09/06/2020 1003   PHURINE 7.0 09/06/2020 1003   GLUCOSEU NEGATIVE 09/06/2020 1003   HGBUR NEGATIVE 09/06/2020 1003   [place] 09/06/2020 1003   [place] 09/06/2020 1003   PROTEINUR >=300 (A) 09/06/2020 1003   NITRITE NEGATIVE 09/06/2020 1003   LEUKOCYTESUR TRACE (A) 09/06/2020 1003    Recent Results (from the past 240 hour(s))  Resp Panel by RT-PCR (Flu A&B, Covid) Nasopharyngeal Swab     Status: None   Collection Time: 10/08/20  1:44 PM   Specimen: Nasopharyngeal Swab; Nasopharyngeal(NP) swabs in vial transport medium  Result Value Ref Range Status   SARS Coronavirus 2 by RT PCR NEGATIVE NEGATIVE Final    Comment: (NOTE) SARS-CoV-2 target nucleic acids are NOT DETECTED.  The SARS-CoV-2 RNA is generally detectable in upper respiratory specimens during the acute phase of  infection. The lowest concentration of SARS-CoV-2 viral copies this assay can detect is 138 copies/mL. A negative result does not preclude SARS-Cov-2 infection and should not be used as the sole basis for treatment or other patient management decisions. A negative result may occur with  improper specimen collection/handling, submission of specimen other than nasopharyngeal swab, presence of viral mutation(s) within the areas targeted by this assay, and inadequate number of viral copies(<138 copies/mL). A negative result must be combined with clinical observations,  patient history, and epidemiological information. The expected result is Negative.  Fact Sheet for Patients:  EntrepreneurPulse.com.au  Fact Sheet for Healthcare Providers:  IncredibleEmployment.be  This test is no t yet approved or cleared by the [place] FDA and  has been authorized for detection and/or diagnosis of SARS-CoV-2 by FDA under an Emergency Use Authorization (EUA). This EUA will remain  in effect (meaning this test can be used) for the duration of the COVID-19 declaration under Section 564(b)(1) of the Act, 21 U.S.C.section 360bbb-3(b)(1), unless the authorization is terminated  or revoked sooner.       Influenza A by PCR NEGATIVE NEGATIVE Final   Influenza B by PCR NEGATIVE NEGATIVE Final    Comment: (NOTE) The Xpert Xpress SARS-CoV-2/FLU/RSV plus assay is intended as an aid in the diagnosis of influenza from Nasopharyngeal swab specimens and should not be used as a sole basis for treatment. Nasal washings and aspirates are unacceptable for Xpert Xpress SARS-CoV-2/FLU/RSV testing.  Fact Sheet for Patients: EntrepreneurPulse.com.au  Fact Sheet for Healthcare Providers: IncredibleEmployment.be  This test is not yet approved or cleared by the [place] FDA and has been authorized for detection and/or diagnosis of SARS-CoV-2  by FDA under an Emergency Use Authorization (EUA). This EUA will remain in effect (meaning this test can be used) for the duration of the COVID-19 declaration under Section 564(b)(1) of the Act, 21 U.S.C. section 360bbb-3(b)(1), unless the authorization is terminated or revoked.  Performed at [facility], [address] [place], [place] [address]      Radiological Exams on Admission: US Venous Img Lower Bilateral (DVT)  Result Date: 10/08/2020 CLINICAL DATA:  80 year old male with a history of edema EXAM: BILATERAL LOWER EXTREMITY VENOUS DOPPLER ULTRASOUND TECHNIQUE: Gray-scale sonography with graded compression, as well as color Doppler and duplex ultrasound were performed to evaluate the lower extremity deep venous systems from the level of the common femoral vein and including the common femoral, femoral, profunda femoral, popliteal and calf veins including the posterior tibial, peroneal and gastrocnemius veins when visible. The superficial great saphenous vein was also interrogated. Spectral Doppler was utilized to evaluate flow at rest and with distal augmentation maneuvers in the common femoral, femoral and popliteal veins. COMPARISON:  None. FINDINGS: RIGHT LOWER EXTREMITY Common Femoral Vein: No evidence of thrombus. Normal compressibility, respiratory phasicity and response to augmentation. Saphenofemoral Junction: No evidence of thrombus. Normal compressibility and flow on color Doppler imaging. Profunda Femoral Vein: No evidence of thrombus. Normal compressibility and flow on color Doppler imaging. Femoral Vein: No evidence of thrombus. Normal compressibility, respiratory phasicity and response to augmentation. Popliteal Vein: No evidence of thrombus. Normal compressibility, respiratory phasicity and response to augmentation. Calf Veins: No evidence of thrombus. Normal compressibility and flow on color Doppler imaging. Superficial Great Saphenous Vein: No evidence of thrombus. Normal  compressibility and flow on color Doppler imaging. Other Findings:  None. LEFT LOWER EXTREMITY Common Femoral Vein: No evidence of thrombus. Normal compressibility, respiratory phasicity and response to augmentation. Saphenofemoral Junction: No evidence of thrombus. Normal compressibility and flow on color Doppler imaging. Profunda Femoral Vein: No evidence of thrombus. Normal compressibility and flow on color Doppler imaging. Femoral Vein: No evidence of thrombus. Normal compressibility, respiratory phasicity and response to augmentation. Popliteal Vein: No evidence of thrombus. Normal compressibility, respiratory phasicity and response to augmentation. Calf Veins: No evidence of thrombus. Normal compressibility and flow on color Doppler imaging. Superficial Great Saphenous Vein: No evidence of thrombus. Normal compressibility and flow on color Doppler imaging Other  Findings:  None. IMPRESSION: Sonographic survey of the left lower extremity negative for DVT Electronically Signed   By: Corrie Mckusick D.O.   On: 10/08/2020 14:55   DG Chest Port 1 View  Result Date: 10/08/2020 CLINICAL DATA:  Fall. EXAM: PORTABLE CHEST 1 VIEW COMPARISON:  08/13/2020. FINDINGS: Dual-lumen catheter noted with tip over right atrium. Cardiomegaly. No pulmonary venous congestion. Low lung volumes with mild bibasilar atelectasis. No pleural effusion or pneumothorax. No acute bony abnormality. IMPRESSION: 1. Dual-lumen catheter noted with tip over right atrium. No pulmonary venous congestion. 2.  Low lung volumes with mild bibasilar atelectasis. Electronically Signed   By: Marcello Moores  Register   On: 10/08/2020 13:18   DG HIP UNILAT W OR W/O PELVIS 2-3 VIEWS RIGHT  Result Date: 10/08/2020 Clinical:  Fall May 27, hip pain X-rays were done of the right hip and pelvis. There is a displaced femoral neck fracture of the right hip with some foreshortening.  Bone quality is good.  Femoral head well located within acetabulum. Impression:   Displaced femoral neck fracture of the right hip. Electronically Signed Sanjuana Kava, MD 6/28/20229:40 AM    EKG: Independently reviewed. Regular rate, sinus rhythm; prolonged PR intervals with no dropped beats.   Assessment/Plan Principal Problem:   Hip fracture (HCC) Active Problems:   Diabetes mellitus with stage 5 chronic kidney disease (HCC)   Anemia   Gastroesophageal reflux disease   Hyperlipidemia   Neuropathy   End stage renal disease (HCC)   Hyponatremia   Hyperglycemia   Hip pain  Hip fracture: right closed displaced femoral neck fracture confirmed by imaging 6/28 - Updated patient's orthopedic surgeon (Dr. Aline Brochure)  - Scheduled to transfer to [facility] for total hip arthroplasty - PT/INR 13.7, 1.1 wnl - Type and screen  2 - Silvestre Gunner [facility] with [facility] consulted and will see patient when he arrives at [facility].    2. Hip pain: consistent with patient's presentation of femoral neck fracture - hydromorphone 0.28m prn  - hydrocodone-acetaminophine 5-325 mg q6h prn - tylenol 10016mq6h prn  3. DM  in setting of ESRD - stable, last A1c was 6.2 - POC glucose was in 300s today - SSI Novolog 0-6 units TID with meals - Insulin detemir 8 units qhs  4. ESRD - Creatinine 6-7 at baseline. Today Cr 4.44 - Continiue HDS as scheduled (M, W, F) - Order CMP - Vit D3 1000 units  [facility] , Dr. DrLawson Radaras notified of consultation request [facility]  4. Hypertension - Patient took home meds this AM (coreg, clonidine, statin). BP systolic 21545Gn ED in setting of severe pain. Improved with hydralazine 1025mV - Coreg 6.25 mg bid PO - Hydralazine 50 mg bid PO - Clonidine 0.2 mg TID - rosuvastatin 10 mg  5. Hyponatremia -  in setting of ESRD and volume overload - Na 132 - continue hemodialysis as scheduled   6. Hyperglycemia - last Hgb A1c  6.2 on 1/20, stable, not on metformins, SGLT2 due to ESRD - -SSI Novolog 0-6 units TID with meals  7. Anemia of chronic disease - baseline Hgb ~8.5-9. Today Hgb 9.9. stable  - monitor  - continue HDS as scheduled  8. Latent TB  - Isoniazid 300m32m pyridoxine 50 mg  DVT prophylaxis: SQ heparin  Code Status: Full code Family Communication: Wife / sister  by telephone update Disposition Plan:   The patient is from: Home  Anticipated d/c is to: Home vs short-term rehab  Patient currently is not medically stable to d/c.  Difficult to place patient No Admission status: Inpatient  Remains inpatient appropriate because:IV treatments appropriate due to intensity of illness or inability to take PO and Inpatient level of care appropriate due to severity of illness  Fredrik Rigger MS4 [facility]  If 7PM-7AM, please contact night-coverage [email or web address] Password [facility]  10/08/2020, 3:20 PM  _______________________________________________  Patient seen and examined with Trevor Iha, Medical student. In addition to supervising the encounter, I played a key role in the decision making process as well as reviewed key findings.  I updated and edited documentation and agree with documentation as noted.  I personally reached out to nephrology and confirmed that orthopedics was consulted and aware that patient is coming to [facility]. Wife and sister updated by me by telephone.   Murvin Natal MD FAAFP Attending Physician [facility] How to contact the [facility] Attending or Consulting provider [place] or covering provider during after hours [place], for this patient?  Check the care team in [facility] and look for a) attending/consulting [facility] provider listed and b) the [facility] team listed Log into [email or web address] and use 's universal password to access. If you do not have the password, please contact the hospital operator. Locate the [facility] provider you are looking for under [facility] and page to a  number that you can be directly reached. If you still have difficulty reaching the provider, please page the [facility] (Director on Call) for the Hospitalists listed on amion for assistance.

## 2020-10-08 NOTE — ED Triage Notes (Addendum)
States he fell at [facility] a month ago and has not been able to walk since.

## 2020-10-08 NOTE — Progress Notes (Signed)
Patient ID: ROLAN WRIGHTSMAN, male   DOB: 08/18/1940, 80 y.o.   MRN: [id number]  Fractured hip   I received notification that Mr Stayer fractured his hip May 27 from a fall; was evaluated in ED fat [facility] for knee pain and sent home .  Dr Luna Glasgow saw him today and xrays show a right femoral neck fracture.  Surgery recommendedas well as preop U/S to rule out DVT    Past Medical History:  Diagnosis Date   Anemia    Chronic kidney disease    Stage 4?    Diabetes mellitus without complication ([place])    Hepatitis    not sure what type - over 20 years ago   History of kidney stones    Hyperlipidemia    Hypertension    Past Surgical History:  Procedure Laterality Date   AV FISTULA PLACEMENT Left 08/09/2019   Procedure: LEFT ARM Basilic  ARTERIOVENOUS  FISTULA CREATION;  Surgeon: Waynetta Sandy, MD;  Location: [place];  Service: Vascular;  Laterality: Left;   [place] Left 11/30/2019   Procedure: LEFT SECOND STAGE [place];  Surgeon: Waynetta Sandy, MD;  Location: [place];  Service: Vascular;  Laterality: Left;   COLONOSCOPY N/A 07/07/2017   Procedure: COLONOSCOPY;  Surgeon: Danie Binder, MD;  [facility];  Service: Endoscopy;  Laterality: N/A;  8:30   IR FLUORO GUIDE CV LINE RIGHT  09/06/2020   IR US GUIDE VASC ACCESS RIGHT  09/06/2020   He is a high risk for perioperative problems and access issues and should be transferred to [facility] for dialysis management, preop iv access, post op management

## 2020-10-09 ENCOUNTER — Encounter (HOSPITAL_COMMUNITY)
Admission: EM | Disposition: A | Discharge status: Skilled Nursing Facility | Payer: Self-pay | Source: Home / Self Care | Attending: Internal Medicine

## 2020-10-09 ENCOUNTER — Inpatient Hospital Stay (HOSPITAL_COMMUNITY): Payer: Medicare HMO | Admitting: Certified Registered"

## 2020-10-09 ENCOUNTER — Encounter (HOSPITAL_COMMUNITY): Payer: Self-pay | Admitting: Family Medicine

## 2020-10-09 ENCOUNTER — Inpatient Hospital Stay (HOSPITAL_COMMUNITY): Payer: Medicare HMO

## 2020-10-09 DIAGNOSIS — K219 Gastro-esophageal reflux disease without esophagitis: Secondary | ICD-10-CM

## 2020-10-09 DIAGNOSIS — D631 Anemia in chronic kidney disease: Secondary | ICD-10-CM

## 2020-10-09 DIAGNOSIS — M25559 Pain in unspecified hip: Secondary | ICD-10-CM

## 2020-10-09 DIAGNOSIS — Z992 Dependence on renal dialysis: Secondary | ICD-10-CM

## 2020-10-09 DIAGNOSIS — G629 Polyneuropathy, unspecified: Secondary | ICD-10-CM

## 2020-10-09 HISTORY — PX: TOTAL HIP ARTHROPLASTY: SHX124

## 2020-10-09 LAB — GLUCOSE, CAPILLARY
Glucose-Capillary: 202 mg/dL — ABNORMAL HIGH (ref 70–99)
Glucose-Capillary: 192 mg/dL — ABNORMAL HIGH (ref 70–99)
Glucose-Capillary: 177 mg/dL — ABNORMAL HIGH (ref 70–99)
Glucose-Capillary: 177 mg/dL — ABNORMAL HIGH (ref 70–99)
Glucose-Capillary: 169 mg/dL — ABNORMAL HIGH (ref 70–99)
Glucose-Capillary: 215 mg/dL — ABNORMAL HIGH (ref 70–99)

## 2020-10-09 LAB — CBC
HCT: 30.2 % — ABNORMAL LOW (ref 39.0–52.0)
Hemoglobin: 9.9 g/dL — ABNORMAL LOW (ref 13.0–17.0)
MCH: 30.7 pg (ref 26.0–34.0)
MCHC: 32.8 g/dL (ref 30.0–36.0)
MCV: 93.8 fL (ref 80.0–100.0)
Platelets: 224 10*3/uL (ref 150–400)
RBC: 3.22 MIL/uL — ABNORMAL LOW (ref 4.22–5.81)
RDW: 16.1 % — ABNORMAL HIGH (ref 11.5–15.5)
WBC: 8 10*3/uL (ref 4.0–10.5)
nRBC: 0 % (ref 0.0–0.2)

## 2020-10-09 LAB — CBC WITH DIFFERENTIAL/PLATELET
Abs Immature Granulocytes: 0.04 10*3/uL (ref 0.00–0.07)
Basophils Absolute: 0 10*3/uL (ref 0.0–0.1)
Basophils Relative: 1 %
Eosinophils Absolute: 0.1 10*3/uL (ref 0.0–0.5)
Eosinophils Relative: 1 %
HCT: 31 % — ABNORMAL LOW (ref 39.0–52.0)
Hemoglobin: 10 g/dL — ABNORMAL LOW (ref 13.0–17.0)
Immature Granulocytes: 1 %
Lymphocytes Relative: 10 %
Lymphs Abs: 0.8 10*3/uL (ref 0.7–4.0)
MCH: 30.5 pg (ref 26.0–34.0)
MCHC: 32.3 g/dL (ref 30.0–36.0)
MCV: 94.5 fL (ref 80.0–100.0)
Monocytes Absolute: 0.6 10*3/uL (ref 0.1–1.0)
Monocytes Relative: 8 %
Neutro Abs: 6.3 10*3/uL (ref 1.7–7.7)
Neutrophils Relative %: 79 %
Platelets: 247 10*3/uL (ref 150–400)
RBC: 3.28 MIL/uL — ABNORMAL LOW (ref 4.22–5.81)
RDW: 16.2 % — ABNORMAL HIGH (ref 11.5–15.5)
WBC: 7.9 10*3/uL (ref 4.0–10.5)
nRBC: 0 % (ref 0.0–0.2)

## 2020-10-09 LAB — RENAL FUNCTION PANEL
Albumin: 2.8 g/dL — ABNORMAL LOW (ref 3.5–5.0)
Anion gap: 11 (ref 5–15)
BUN: 51 mg/dL — ABNORMAL HIGH (ref 8–23)
CO2: 23 mmol/L (ref 22–32)
Calcium: 8.9 mg/dL (ref 8.9–10.3)
Chloride: 99 mmol/L (ref 98–111)
Creatinine, Ser: 5.05 mg/dL — ABNORMAL HIGH (ref 0.61–1.24)
GFR, Estimated: 11 mL/min — ABNORMAL LOW (ref >60)
Glucose, Bld: 219 mg/dL — ABNORMAL HIGH (ref 70–99)
Phosphorus: 4.7 mg/dL — ABNORMAL HIGH (ref 2.5–4.6)
Potassium: 3.7 mmol/L (ref 3.5–5.1)
Sodium: 133 mmol/L — ABNORMAL LOW (ref 135–145)

## 2020-10-09 LAB — MAGNESIUM
Magnesium: 1.6 mg/dL — ABNORMAL LOW (ref 1.7–2.4)
Magnesium: 1.8 mg/dL (ref 1.7–2.4)

## 2020-10-09 LAB — TYPE AND SCREEN
ABO/RH(D): O POS
Antibody Screen: NEGATIVE

## 2020-10-09 LAB — SURGICAL PCR SCREEN
MRSA, PCR: NEGATIVE
Staphylococcus aureus: NEGATIVE

## 2020-10-09 LAB — PHOSPHORUS: Phosphorus: 4.4 mg/dL (ref 2.5–4.6)

## 2020-10-09 IMAGING — DX DG PORTABLE PELVIS
1 series · 1 of 1 positions shown · non-contrast
Comparison: [DATE], [DATE]

CLINICAL DATA: Postop

EXAM:
PORTABLE PELVIS 1-2 VIEWS

[pelvis]
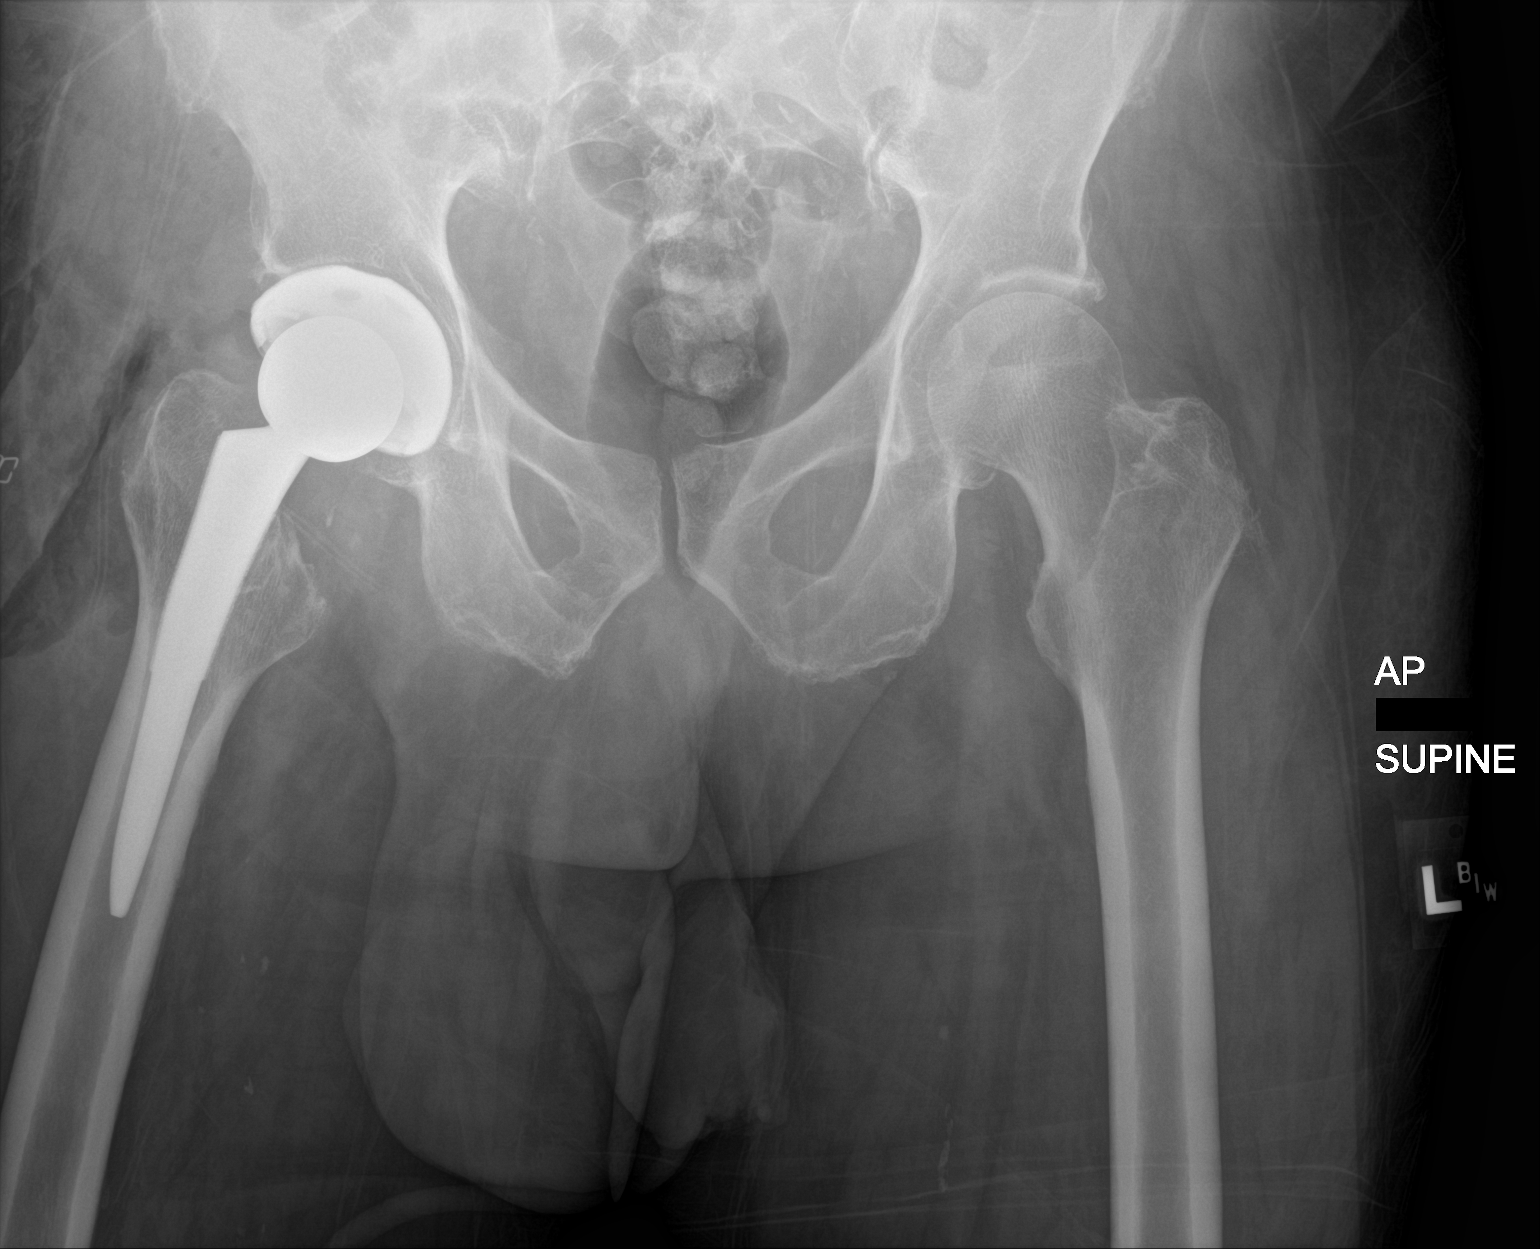

[1 of 1 positions shown; findings below may reference images not displayed]

FINDINGS: Interval right hip replacement with intact hardware and normal
alignment. Gas in the soft tissues consistent with recent surgery.
IMPRESSION: Interval right hip replacement with expected postsurgical changes

## 2020-10-09 IMAGING — RF DG HIP (WITH PELVIS) OPERATIVE*R*
1 series · 7 of 7 positions shown · non-contrast
Comparison: [DATE].

CLINICAL DATA: Total hip arthroplasty.

EXAM:
DG C-ARM 1-60 MIN; OPERATIVE RIGHT HIP WITH PELVIS
FLUOROSCOPY TIME:  Fluoroscopy Time:  11 seconds.
Radiation Exposure Index (if provided by the fluoroscopic device):
1.31 mGy.
Number of Acquired Spot Images: 7

[Series 1: run · 7 of 7 slices shown]
[im 1/7]
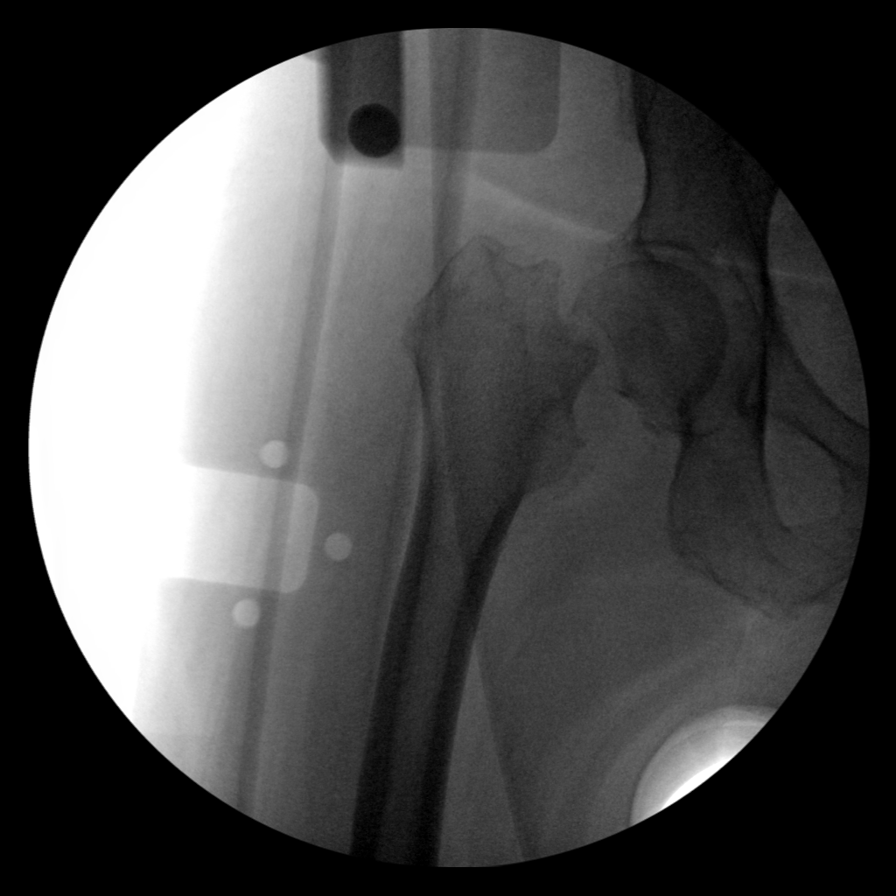
[im 2/7]
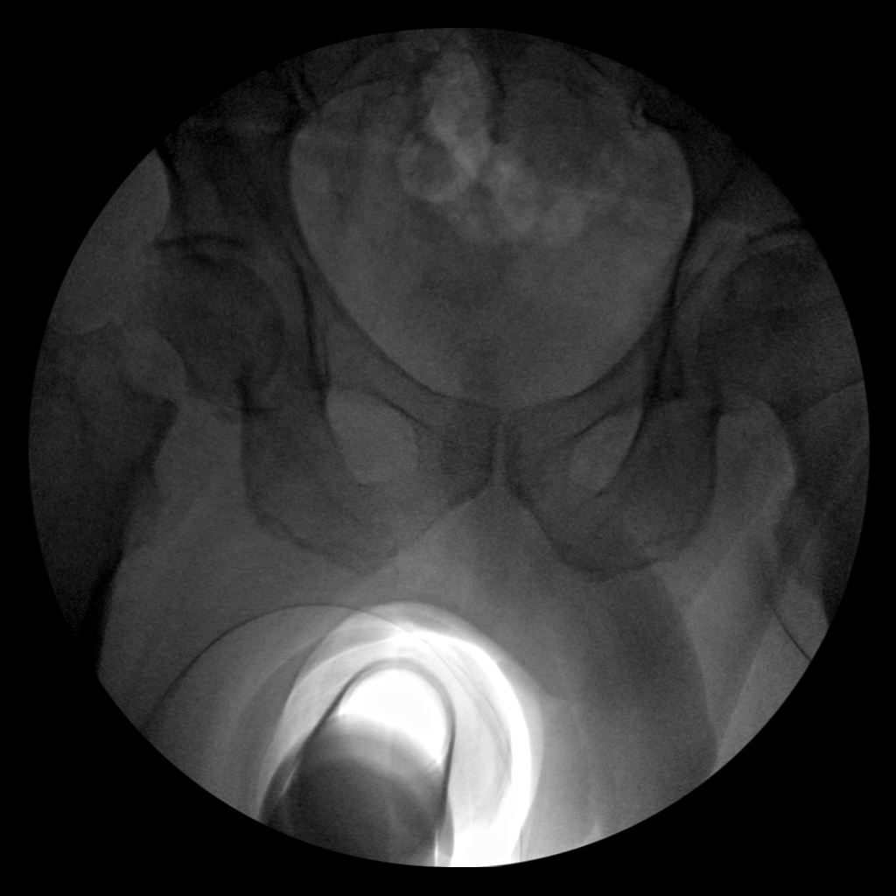
[im 3/7]
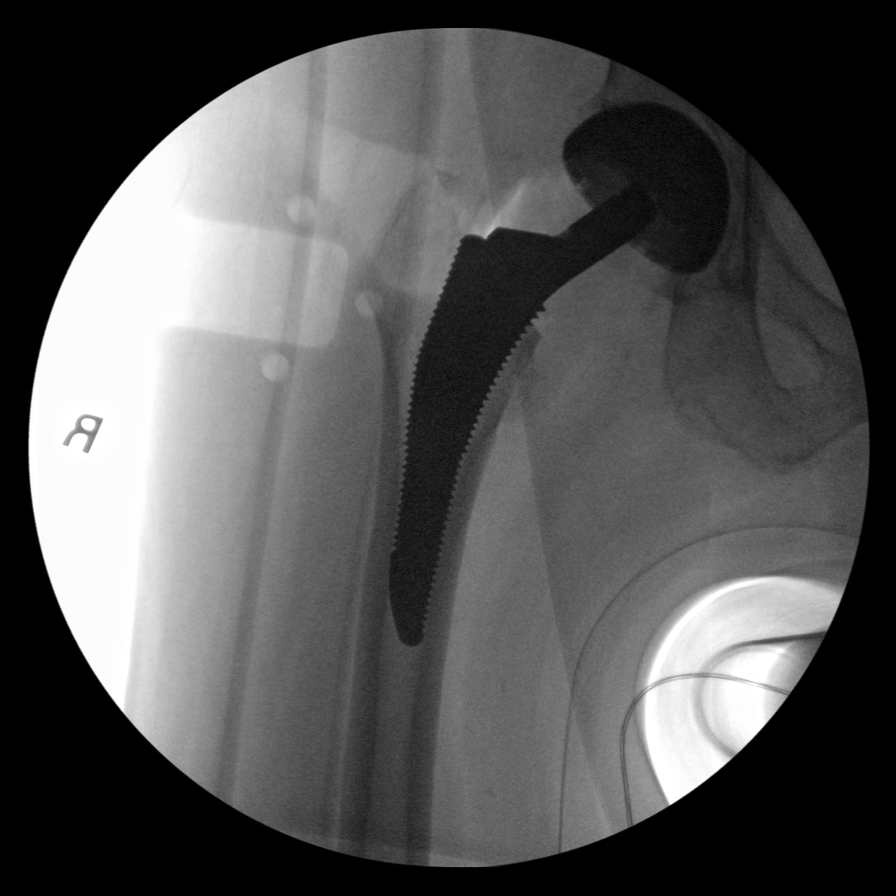
[im 4/7]
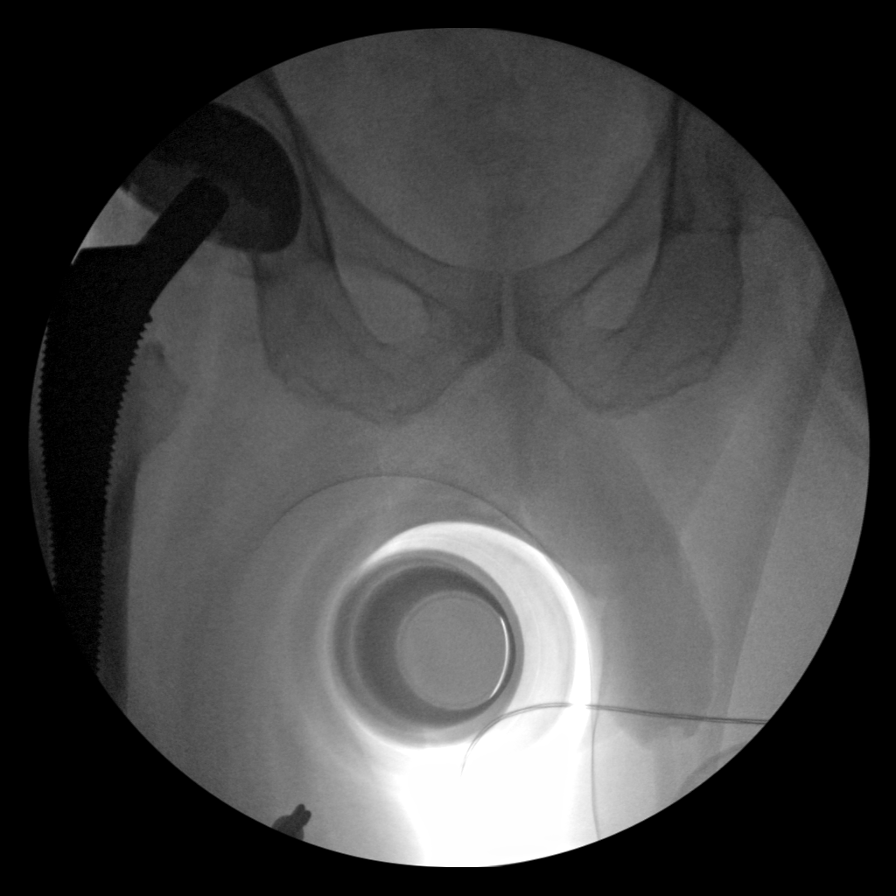
[im 5/7]
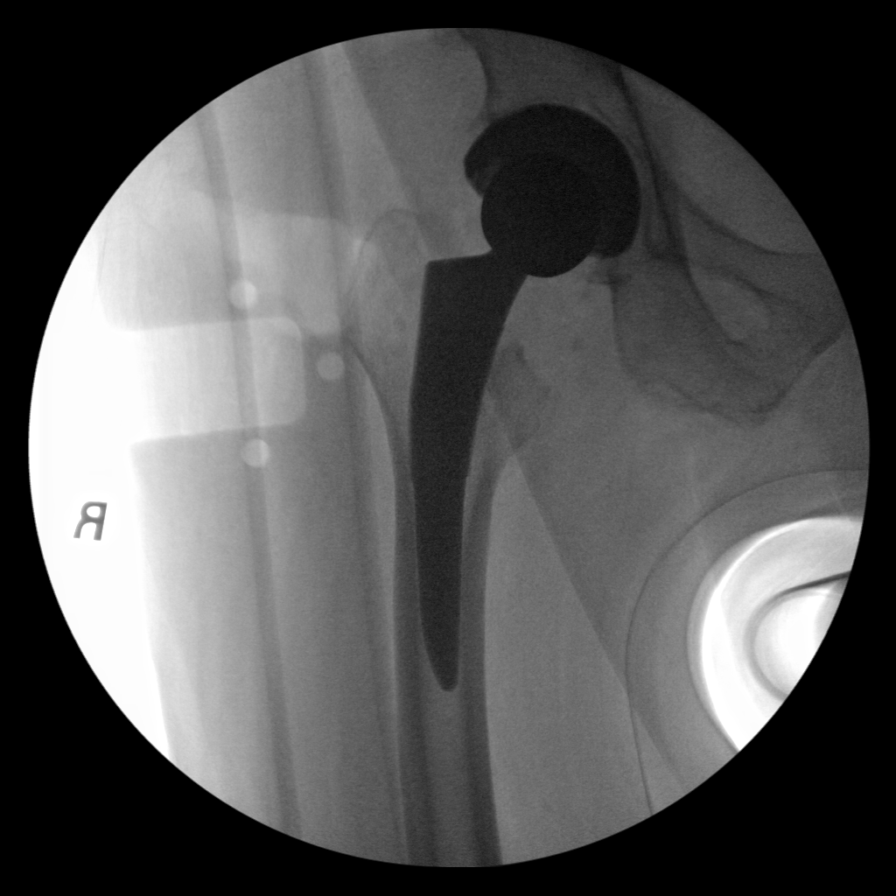
[im 6/7]
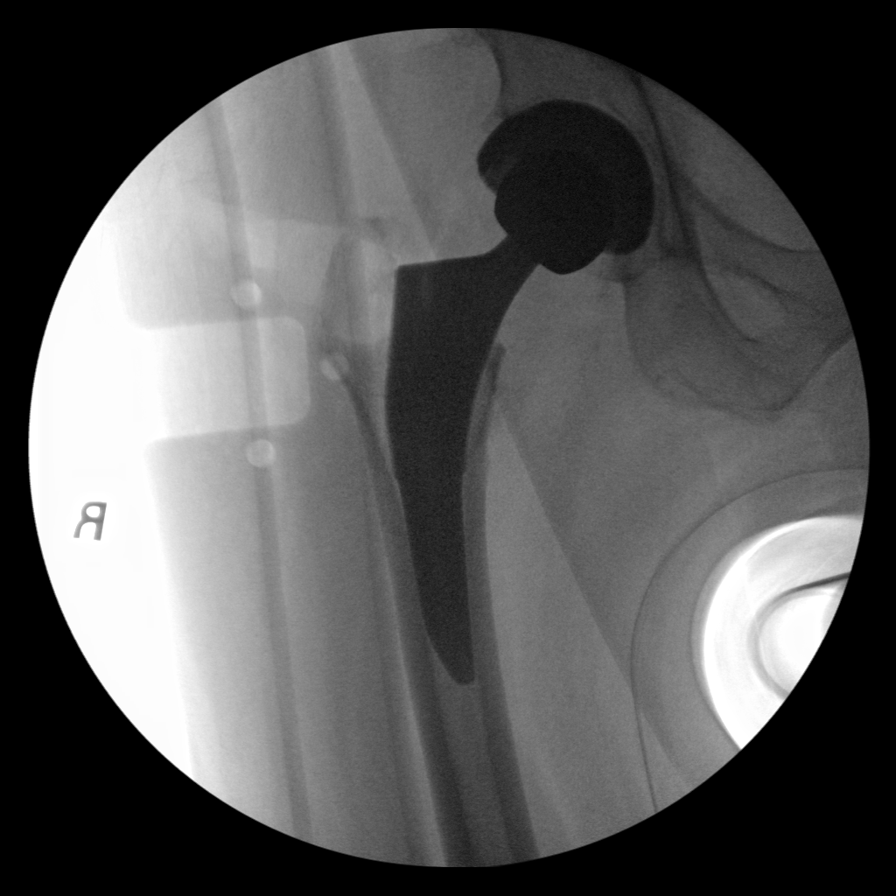
[im 7/7]
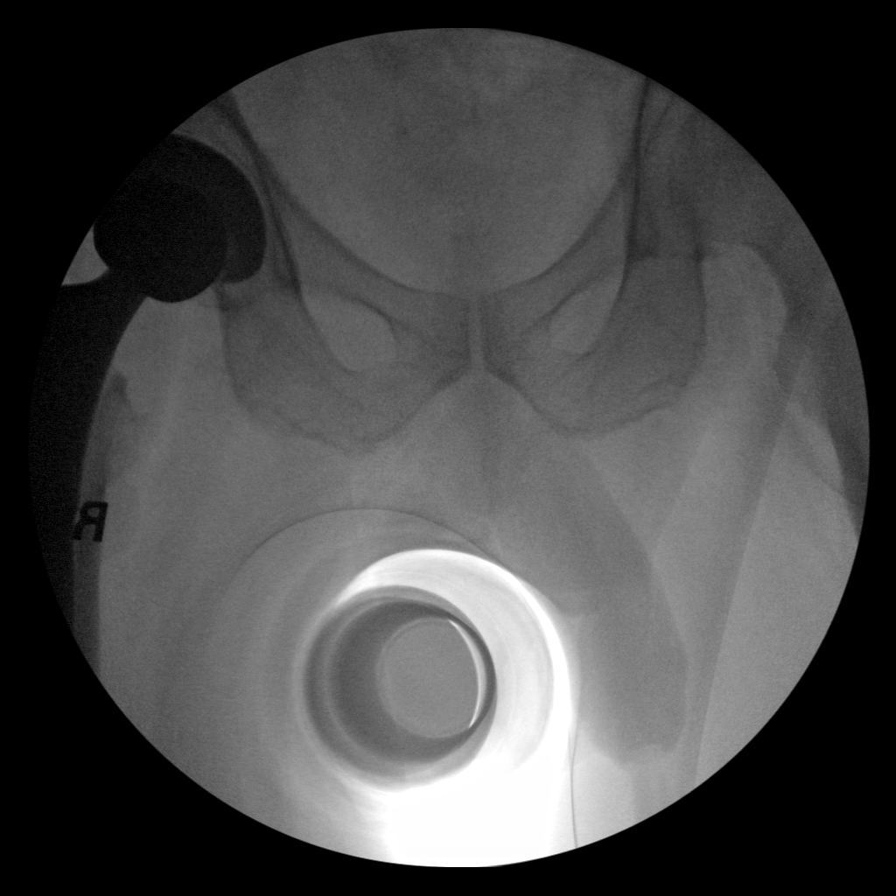

[7 of 7 positions shown; findings below may reference images not displayed]

FINDINGS: Seven C-arm fluoroscopic images were obtained intraoperatively and
submitted for post operative interpretation. These images
demonstrate sequential changes associated with right total hip
arthroplasty. No unexpected findings. No unexpected radiopaque
foreign bodies. Please see the performing provider's procedural
report for further detail.
IMPRESSION: Right total hip arthroplasty.

## 2020-10-09 IMAGING — RF DG C-ARM 1-60 MIN
1 series · 7 of 7 positions shown · non-contrast
Comparison: [DATE].

CLINICAL DATA: Total hip arthroplasty.

EXAM:
DG C-ARM 1-60 MIN; OPERATIVE RIGHT HIP WITH PELVIS
FLUOROSCOPY TIME:  Fluoroscopy Time:  11 seconds.
Radiation Exposure Index (if provided by the fluoroscopic device):
1.31 mGy.
Number of Acquired Spot Images: 7

[Series 1: run · 7 of 7 slices shown]
[im 1/7]
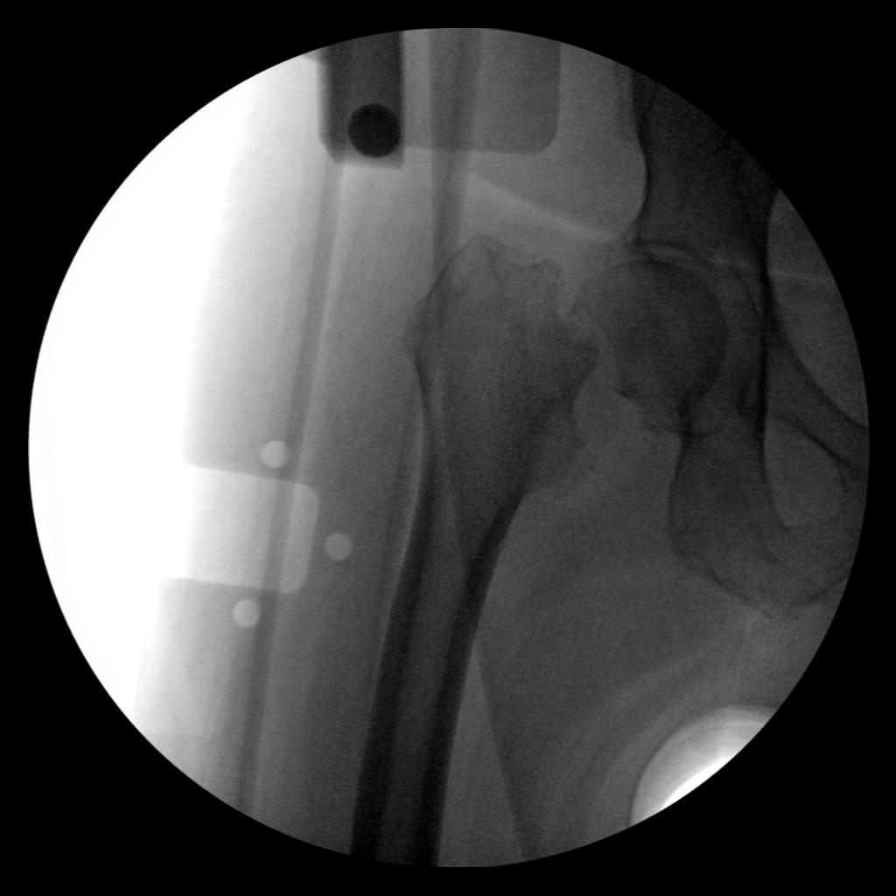
[im 2/7]
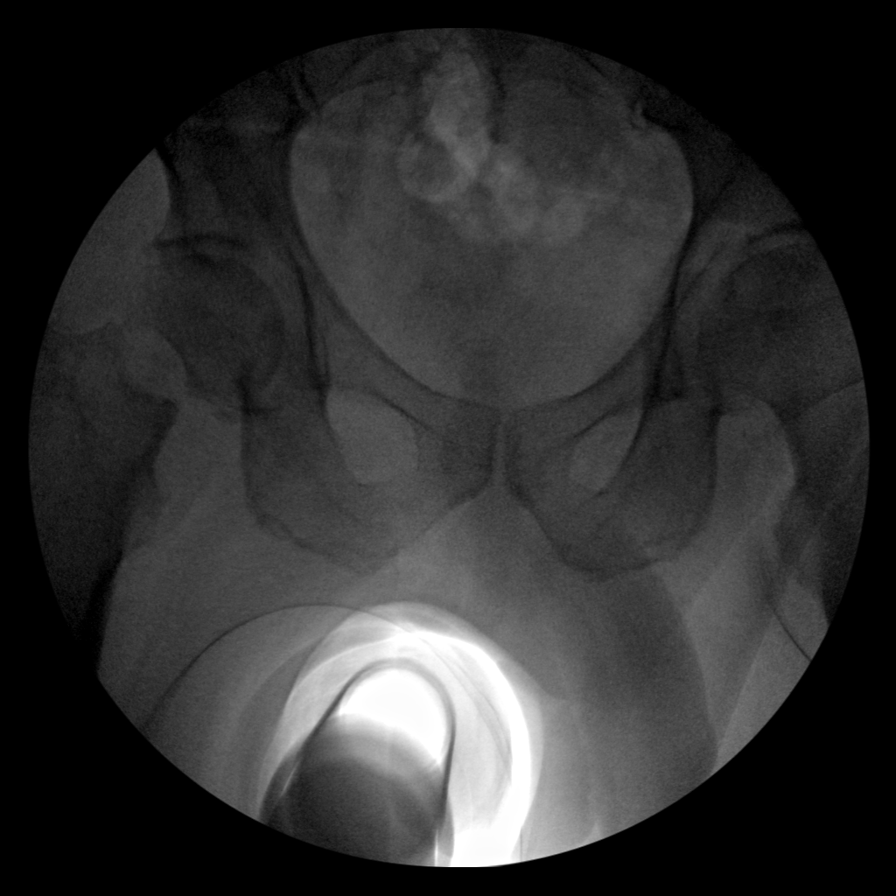
[im 3/7]
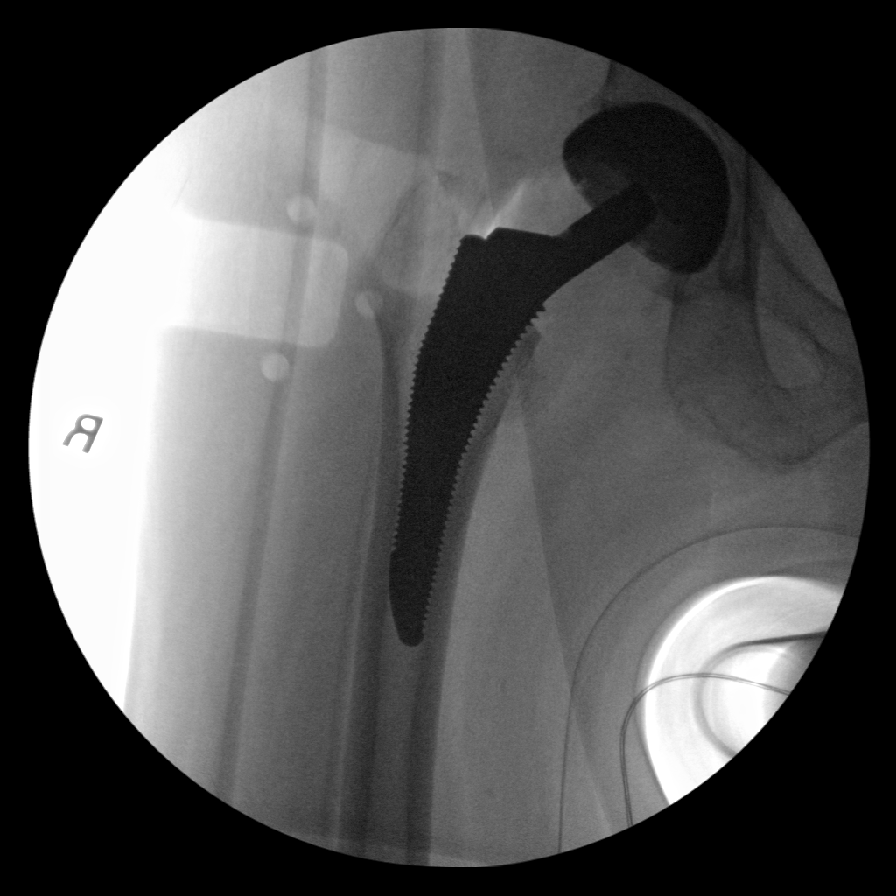
[im 4/7]
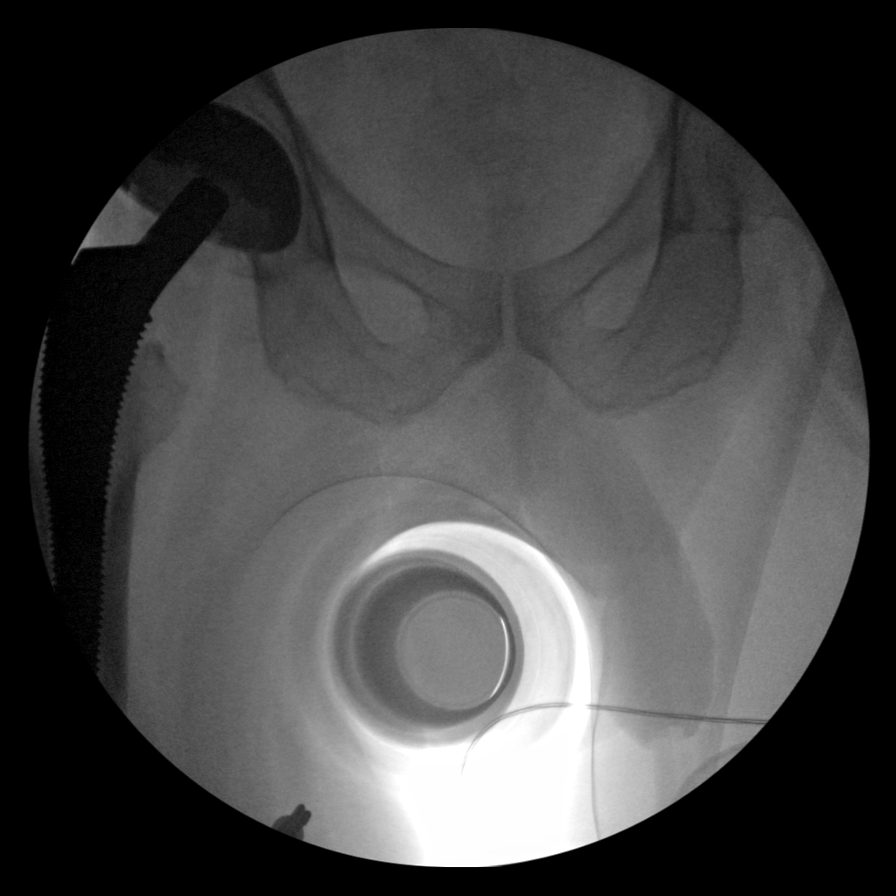
[im 5/7]
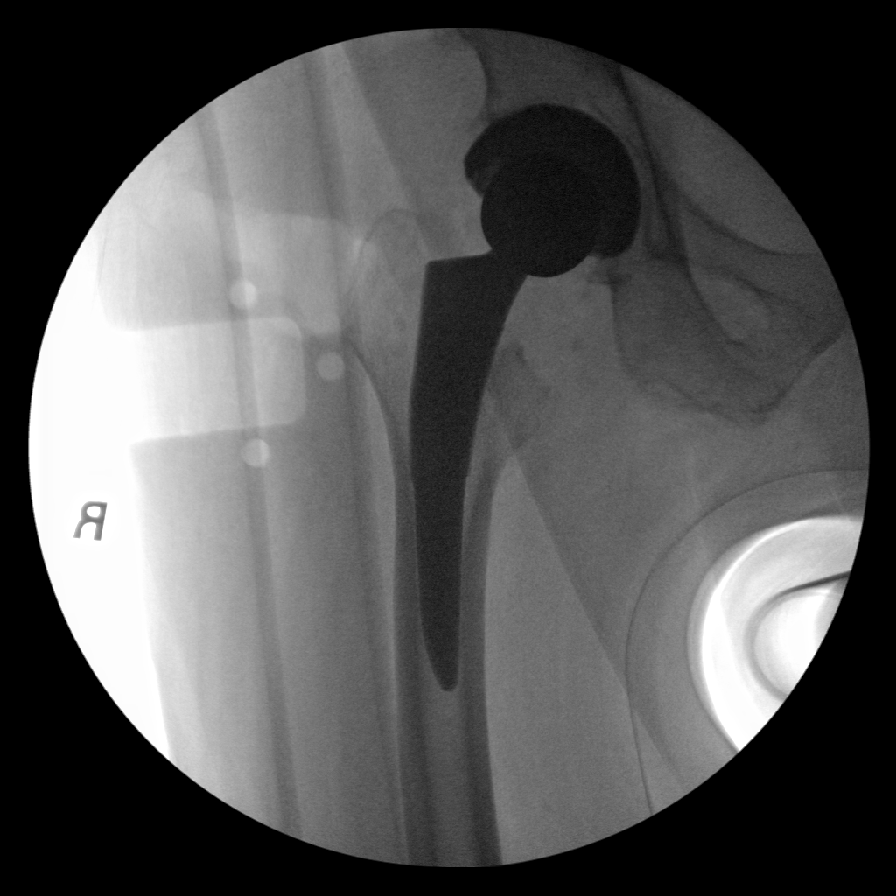
[im 6/7]
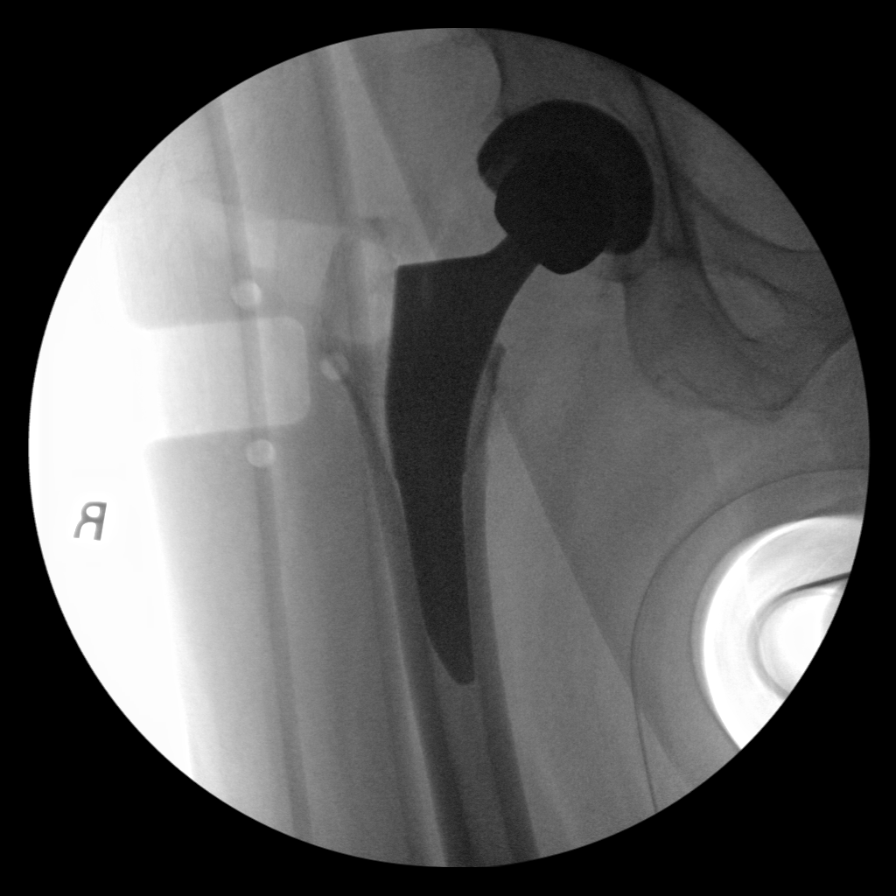
[im 7/7]
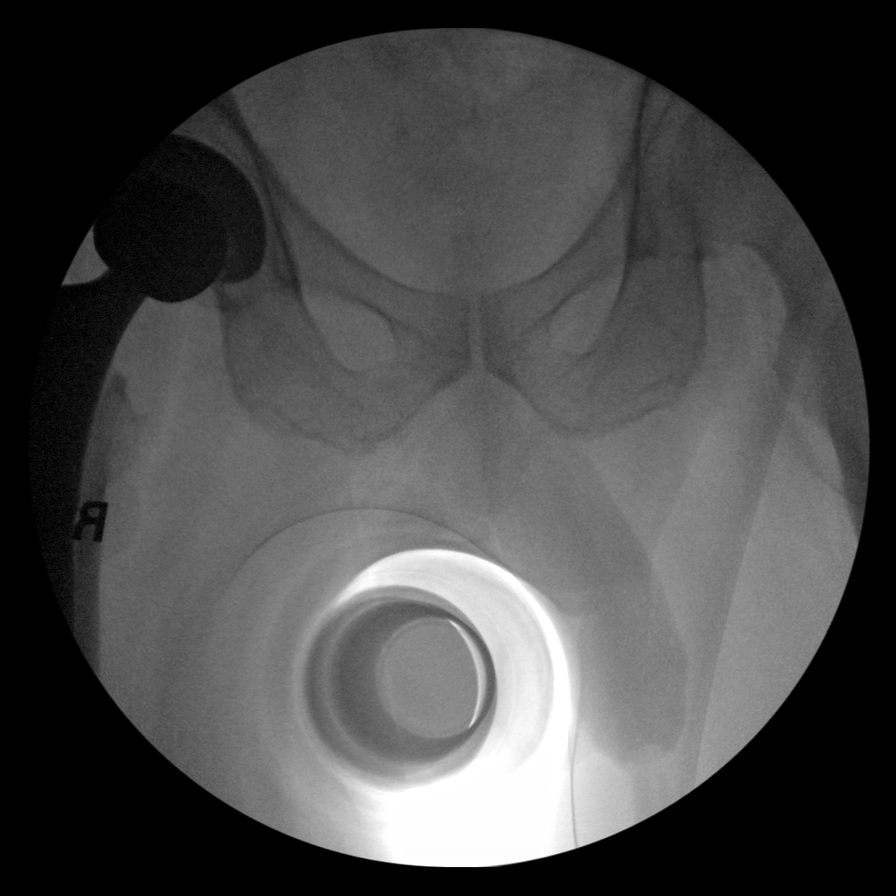

[7 of 7 positions shown; findings below may reference images not displayed]

FINDINGS: Seven C-arm fluoroscopic images were obtained intraoperatively and
submitted for post operative interpretation. These images
demonstrate sequential changes associated with right total hip
arthroplasty. No unexpected findings. No unexpected radiopaque
foreign bodies. Please see the performing provider's procedural
report for further detail.
IMPRESSION: Right total hip arthroplasty.

## 2020-10-09 SURGERY — ARTHROPLASTY, HIP, TOTAL, ANTERIOR APPROACH
Anesthesia: General | Site: Hip | Laterality: Right

## 2020-10-09 MED ORDER — PROPOFOL 10 MG/ML IV BOLUS
INTRAVENOUS | Status: DC | PRN
  Administered 2020-10-09: 30 mg via INTRAVENOUS

## 2020-10-09 MED ORDER — POVIDONE-IODINE 10 % EX SWAB: 2.0000 "application " | Freq: Once | CUTANEOUS | Status: DC

## 2020-10-09 MED ORDER — SODIUM CHLORIDE 0.9 % IV SOLN: INTRAVENOUS | Status: DC | PRN

## 2020-10-09 MED ORDER — FENTANYL CITRATE (PF) 250 MCG/5ML IJ SOLN
INTRAMUSCULAR | Status: AC
  Filled 2020-10-09: qty 5

## 2020-10-09 MED ORDER — SODIUM CHLORIDE 0.9 % IV SOLN: 100.0000 mL | INTRAVENOUS | Status: DC | PRN

## 2020-10-09 MED ORDER — CEFAZOLIN SODIUM-DEXTROSE 2-4 GM/100ML-% IV SOLN
2.0000 g | INTRAVENOUS | Status: AC
  Administered 2020-10-09: 2 g via INTRAVENOUS
  Filled 2020-10-09 (×2): qty 100

## 2020-10-09 MED ORDER — MENTHOL 3 MG MT LOZG: 1.0000 | LOZENGE | OROMUCOSAL | Status: DC | PRN

## 2020-10-09 MED ORDER — SODIUM CHLORIDE 0.9 % IR SOLN
Status: DC | PRN
  Administered 2020-10-09: 1000 mL
  Administered 2020-10-09: 3000 mL

## 2020-10-09 MED ORDER — CHLORHEXIDINE GLUCONATE 4 % EX LIQD
60.0000 mL | Freq: Once | CUTANEOUS | Status: AC
  Administered 2020-10-09: 4 via TOPICAL
  Filled 2020-10-09: qty 15

## 2020-10-09 MED ORDER — PENTAFLUOROPROP-TETRAFLUOROETH EX AERO: 1.0000 "application " | INHALATION_SPRAY | CUTANEOUS | Status: DC | PRN

## 2020-10-09 MED ORDER — HEPARIN SODIUM (PORCINE) 1000 UNIT/ML DIALYSIS
1000.0000 [IU] | INTRAMUSCULAR | Status: DC | PRN
  Filled 2020-10-09 (×2): qty 1

## 2020-10-09 MED ORDER — METOCLOPRAMIDE HCL 5 MG PO TABS: 5.0000 mg | ORAL_TABLET | Freq: Three times a day (TID) | ORAL | Status: DC | PRN

## 2020-10-09 MED ORDER — KETOROLAC TROMETHAMINE 30 MG/ML IJ SOLN
INTRAMUSCULAR | Status: DC | PRN
  Administered 2020-10-09: 30 mg

## 2020-10-09 MED ORDER — MIDAZOLAM HCL 2 MG/2ML IJ SOLN
INTRAMUSCULAR | Status: AC
  Filled 2020-10-09: qty 2

## 2020-10-09 MED ORDER — CHLORHEXIDINE GLUCONATE 4 % EX LIQD: 60.0000 mL | Freq: Once | CUTANEOUS | Status: DC

## 2020-10-09 MED ORDER — PROSOURCE PLUS PO LIQD
30.0000 mL | Freq: Three times a day (TID) | ORAL | Status: DC
  Administered 2020-10-10 – 2020-10-15 (×10): 30 mL via ORAL
  Filled 2020-10-09 (×11): qty 30

## 2020-10-09 MED ORDER — ONDANSETRON HCL 4 MG PO TABS: 4.0000 mg | ORAL_TABLET | Freq: Four times a day (QID) | ORAL | Status: DC | PRN

## 2020-10-09 MED ORDER — PROPOFOL 10 MG/ML IV BOLUS
INTRAVENOUS | Status: AC
  Filled 2020-10-09: qty 20

## 2020-10-09 MED ORDER — FENTANYL CITRATE (PF) 100 MCG/2ML IJ SOLN
INTRAMUSCULAR | Status: DC | PRN
  Administered 2020-10-09: 50 ug via INTRAVENOUS

## 2020-10-09 MED ORDER — 0.9 % SODIUM CHLORIDE (POUR BTL) OPTIME
TOPICAL | Status: DC | PRN
  Administered 2020-10-09: 1000 mL

## 2020-10-09 MED ORDER — KETOROLAC TROMETHAMINE 30 MG/ML IJ SOLN
INTRAMUSCULAR | Status: AC
  Filled 2020-10-09: qty 1

## 2020-10-09 MED ORDER — CHLORHEXIDINE GLUCONATE CLOTH 2 % EX PADS: 6.0000 | MEDICATED_PAD | Freq: Every day | CUTANEOUS | Status: DC

## 2020-10-09 MED ORDER — ALBUMIN HUMAN 5 % IV SOLN: INTRAVENOUS | Status: DC | PRN

## 2020-10-09 MED ORDER — SODIUM CHLORIDE (PF) 0.9 % IJ SOLN
INTRAMUSCULAR | Status: DC | PRN
  Administered 2020-10-09: 30 mL

## 2020-10-09 MED ORDER — HYDROCODONE-ACETAMINOPHEN 5-325 MG PO TABS
ORAL_TABLET | ORAL | Status: AC
  Administered 2020-10-10: 2 via ORAL
  Filled 2020-10-09: qty 2

## 2020-10-09 MED ORDER — ASPIRIN EC 325 MG PO TBEC
325.0000 mg | DELAYED_RELEASE_TABLET | Freq: Every day | ORAL | Status: DC
  Administered 2020-10-10 – 2020-10-19 (×10): 325 mg via ORAL
  Filled 2020-10-09 (×10): qty 1

## 2020-10-09 MED ORDER — BUPIVACAINE-EPINEPHRINE 0.5% -1:200000 IJ SOLN
INTRAMUSCULAR | Status: AC
  Filled 2020-10-09: qty 1

## 2020-10-09 MED ORDER — METOCLOPRAMIDE HCL 5 MG/ML IJ SOLN: 5.0000 mg | Freq: Three times a day (TID) | INTRAMUSCULAR | Status: DC | PRN

## 2020-10-09 MED ORDER — DOCUSATE SODIUM 100 MG PO CAPS
100.0000 mg | ORAL_CAPSULE | Freq: Two times a day (BID) | ORAL | Status: DC
  Administered 2020-10-09 – 2020-10-13 (×8): 100 mg via ORAL
  Filled 2020-10-09 (×8): qty 1

## 2020-10-09 MED ORDER — MIDAZOLAM HCL 5 MG/5ML IJ SOLN
INTRAMUSCULAR | Status: DC | PRN
  Administered 2020-10-09: 2 mg via INTRAVENOUS

## 2020-10-09 MED ORDER — HYDROMORPHONE HCL 1 MG/ML IJ SOLN
INTRAMUSCULAR | Status: AC
  Filled 2020-10-09: qty 1

## 2020-10-09 MED ORDER — PHENYLEPHRINE HCL-NACL 10-0.9 MG/250ML-% IV SOLN
INTRAVENOUS | Status: DC | PRN
  Administered 2020-10-09: 40 ug/min via INTRAVENOUS

## 2020-10-09 MED ORDER — TRANEXAMIC ACID-NACL 1000-0.7 MG/100ML-% IV SOLN
1000.0000 mg | INTRAVENOUS | Status: AC
  Administered 2020-10-09: 1000 mg via INTRAVENOUS
  Filled 2020-10-09: qty 100

## 2020-10-09 MED ORDER — PROPOFOL 500 MG/50ML IV EMUL
INTRAVENOUS | Status: DC | PRN
  Administered 2020-10-09: 75 ug/kg/min via INTRAVENOUS

## 2020-10-09 MED ORDER — CHLORHEXIDINE GLUCONATE 0.12 % MT SOLN
OROMUCOSAL | Status: AC
  Administered 2020-10-09: 15 mL
  Filled 2020-10-09: qty 15

## 2020-10-09 MED ORDER — PHENOL 1.4 % MT LIQD: 1.0000 | OROMUCOSAL | Status: DC | PRN

## 2020-10-09 MED ORDER — LIDOCAINE HCL (PF) 1 % IJ SOLN
5.0000 mL | INTRAMUSCULAR | Status: DC | PRN
  Filled 2020-10-09: qty 5

## 2020-10-09 MED ORDER — ALTEPLASE 2 MG IJ SOLR
2.0000 mg | Freq: Once | INTRAMUSCULAR | Status: DC | PRN
  Filled 2020-10-09: qty 2

## 2020-10-09 MED ORDER — HYDRALAZINE HCL 20 MG/ML IJ SOLN
INTRAMUSCULAR | Status: AC
  Filled 2020-10-09: qty 1

## 2020-10-09 MED ORDER — BUPIVACAINE-EPINEPHRINE (PF) 0.5% -1:200000 IJ SOLN
INTRAMUSCULAR | Status: DC | PRN
  Administered 2020-10-09: 30 mL via PERINEURAL

## 2020-10-09 MED ORDER — CEFAZOLIN SODIUM-DEXTROSE 2-4 GM/100ML-% IV SOLN: 2.0000 g | INTRAVENOUS | Status: DC

## 2020-10-09 MED ORDER — ONDANSETRON HCL 4 MG/2ML IJ SOLN
4.0000 mg | Freq: Four times a day (QID) | INTRAMUSCULAR | Status: DC | PRN
  Administered 2020-10-09: 4 mg via INTRAVENOUS
  Filled 2020-10-09: qty 2

## 2020-10-09 MED ORDER — CEFAZOLIN SODIUM-DEXTROSE 2-4 GM/100ML-% IV SOLN: 2.0000 g | Freq: Four times a day (QID) | INTRAVENOUS | Status: DC

## 2020-10-09 MED ORDER — TRANEXAMIC ACID-NACL 1000-0.7 MG/100ML-% IV SOLN: 1000.0000 mg | INTRAVENOUS | Status: DC

## 2020-10-09 MED ORDER — RENA-VITE PO TABS
1.0000 | ORAL_TABLET | Freq: Every day | ORAL | Status: DC
  Administered 2020-10-10 – 2020-10-18 (×9): 1 via ORAL
  Filled 2020-10-09 (×9): qty 1

## 2020-10-09 MED ORDER — LIDOCAINE-PRILOCAINE 2.5-2.5 % EX CREA
1.0000 "application " | TOPICAL_CREAM | CUTANEOUS | Status: DC | PRN
  Filled 2020-10-09: qty 5

## 2020-10-09 MED ORDER — TRANEXAMIC ACID-NACL 1000-0.7 MG/100ML-% IV SOLN
1000.0000 mg | Freq: Once | INTRAVENOUS | Status: AC
  Administered 2020-10-09: 1000 mg via INTRAVENOUS
  Filled 2020-10-09: qty 100

## 2020-10-09 SURGICAL SUPPLY — 66 items
ALCOHOL 70% 16 OZ (MISCELLANEOUS) ×2 IMPLANT
BAG COUNTER SPONGE SURGICOUNT (BAG) ×2 IMPLANT
BLADE CLIPPER SURG (BLADE) IMPLANT
CHLORAPREP W/TINT 26 (MISCELLANEOUS) ×2 IMPLANT
COVER SURGICAL LIGHT HANDLE (MISCELLANEOUS) ×2 IMPLANT
CUP ACET PNNCL SECTR W/GRIP 56 (Hips) IMPLANT
DERMABOND ADVANCED (GAUZE/BANDAGES/DRESSINGS) ×1
DERMABOND ADVANCED .7 DNX12 (GAUZE/BANDAGES/DRESSINGS) ×2 IMPLANT
DRAPE C-ARM 42X72 X-RAY (DRAPES) ×2 IMPLANT
DRAPE STERI IOBAN 125X83 (DRAPES) ×2 IMPLANT
DRAPE U-SHAPE 47X51 STRL (DRAPES) ×6 IMPLANT
DRSG AQUACEL AG ADV 3.5X10 (GAUZE/BANDAGES/DRESSINGS) ×2 IMPLANT
ELECT BLADE 4.0 EZ CLEAN MEGAD (MISCELLANEOUS) ×2
ELECT PENCIL ROCKER SW 15FT (MISCELLANEOUS) ×2 IMPLANT
ELECT REM PT RETURN 9FT ADLT (ELECTROSURGICAL) ×2
ELECTRODE BLDE 4.0 EZ CLN MEGD (MISCELLANEOUS) ×1 IMPLANT
ELECTRODE REM PT RTRN 9FT ADLT (ELECTROSURGICAL) ×1 IMPLANT
EVACUATOR 1/8 PVC DRAIN (DRAIN) IMPLANT
GLOVE SURG ENC MOIS LTX SZ8.5 (GLOVE) ×4 IMPLANT
GLOVE SURG ENC TEXT LTX SZ7 (GLOVE) ×2 IMPLANT
GLOVE SURG UNDER POLY LF SZ7.5 (GLOVE) ×2 IMPLANT
GLOVE SURG UNDER POLY LF SZ8.5 (GLOVE) ×2 IMPLANT
GOWN STRL REUS W/ TWL LRG LVL3 (GOWN DISPOSABLE) ×2 IMPLANT
GOWN STRL REUS W/ TWL XL LVL3 (GOWN DISPOSABLE) ×1 IMPLANT
GOWN STRL REUS W/TWL 2XL LVL3 (GOWN DISPOSABLE) ×2 IMPLANT
GOWN STRL REUS W/TWL LRG LVL3 (GOWN DISPOSABLE) ×2
GOWN STRL REUS W/TWL XL LVL3 (GOWN DISPOSABLE) ×1
HANDPIECE INTERPULSE COAX TIP (DISPOSABLE) ×1
HEAD M SROM 36MM PLUS 1.5 (Hips) IMPLANT
HOOD PEEL AWAY FACE SHEILD DIS (HOOD) ×4 IMPLANT
JET LAVAGE IRRISEPT WOUND (IRRIGATION / IRRIGATOR) ×2
KIT BASIN OR (CUSTOM PROCEDURE TRAY) ×2 IMPLANT
KIT TURNOVER KIT B (KITS) ×2 IMPLANT
LAVAGE JET IRRISEPT WOUND (IRRIGATION / IRRIGATOR) ×1 IMPLANT
MANIFOLD NEPTUNE II (INSTRUMENTS) ×2 IMPLANT
MARKER SKIN DUAL TIP RULER LAB (MISCELLANEOUS) ×4 IMPLANT
NDL SPNL 18GX3.5 QUINCKE PK (NEEDLE) ×1 IMPLANT
NEEDLE SPNL 18GX3.5 QUINCKE PK (NEEDLE) ×2 IMPLANT
NS IRRIG 1000ML POUR BTL (IV SOLUTION) ×2 IMPLANT
PACK TOTAL JOINT (CUSTOM PROCEDURE TRAY) ×2 IMPLANT
PACK UNIVERSAL I (CUSTOM PROCEDURE TRAY) ×2 IMPLANT
PAD ARMBOARD 7.5X6 YLW CONV (MISCELLANEOUS) ×4 IMPLANT
PINN SECTOR W/GRIP ACE CUP 56 (Hips) ×2 IMPLANT
PINNACLE ALTRX PLUS 4 N 36X56 (Hips) ×1 IMPLANT
SAW OSC TIP CART 19.5X105X1.3 (SAW) ×2 IMPLANT
SEALER BIPOLAR AQUA 6.0 (INSTRUMENTS) ×1 IMPLANT
SET HNDPC FAN SPRY TIP SCT (DISPOSABLE) ×1 IMPLANT
SOL PREP POV-IOD 4OZ 10% (MISCELLANEOUS) ×2 IMPLANT
SROM M HEAD 36MM PLUS 1.5 (Hips) ×2 IMPLANT
STEM TRI LOC BPS SZ6 W GRIPTON IMPLANT
SUT ETHIBOND NAB CT1 #1 30IN (SUTURE) ×4 IMPLANT
SUT MNCRL AB 3-0 PS2 18 (SUTURE) ×2 IMPLANT
SUT MON AB 2-0 CT1 36 (SUTURE) ×2 IMPLANT
SUT VIC AB 1 CT1 27 (SUTURE) ×1
SUT VIC AB 1 CT1 27XBRD ANBCTR (SUTURE) ×1 IMPLANT
SUT VIC AB 2-0 CT1 27 (SUTURE) ×1
SUT VIC AB 2-0 CT1 TAPERPNT 27 (SUTURE) ×1 IMPLANT
SUT VLOC 180 0 24IN GS25 (SUTURE) ×2 IMPLANT
SYR 50ML LL SCALE MARK (SYRINGE) ×2 IMPLANT
TOWEL GREEN STERILE (TOWEL DISPOSABLE) ×2 IMPLANT
TOWEL GREEN STERILE FF (TOWEL DISPOSABLE) ×2 IMPLANT
TRAY CATH 16FR W/PLASTIC CATH (SET/KITS/TRAYS/PACK) IMPLANT
TRAY FOLEY W/BAG SLVR 16FR (SET/KITS/TRAYS/PACK) ×1
TRAY FOLEY W/BAG SLVR 16FR ST (SET/KITS/TRAYS/PACK) IMPLANT
TRI LOC BPS SZ 6 W GRIPTON ×2 IMPLANT
WATER STERILE IRR 1000ML POUR (IV SOLUTION) ×4 IMPLANT

## 2020-10-09 NOTE — Progress Notes (Signed)
Initial Nutrition Assessment  DOCUMENTATION CODES:   Obesity unspecified  INTERVENTION:    -Once diet is advanced, add:   -30 ml Prosource Plus TID, each supplement provides 100 kcals and 15 grams protein -Renal MVI daily  NUTRITION DIAGNOSIS:   Increased nutrient needs related to post-op healing as evidenced by estimated needs.  GOAL:   Patient will meet greater than or equal to 90% of their needs  MONITOR:   PO intake, Supplement acceptance, Diet advancement, Labs, Weight trends, Skin, I & O's  REASON FOR ASSESSMENT:   Consult Assessment of nutrition requirement/status, Hip fracture protocol  ASSESSMENT:   Trevor [name] is a 80 yo male with pertinent medical history of ESRD on dialysis and controlled DM presenting to the ED with hip pain and inability to ambulate x1 month. Patient went to [facility] to get hemodialysis port replacement but felt dizzy and fell. At [facility] ED patient received a knee xray which revealed no injury. After port replacement and discharge, patient continued to experience severe R hip pain rendering him unable to walk for a month. He saw Dr. Sanjuana Kava of orthopedic surgery today who ordered a hip XR which revealed displaced femoral neck fracture. Patient needs surgery and will be transferred to [facility] for complex anesthesia with dialysis. Patient received dialysis three times a week.  Pt admitted with rt hip fracture.   6/29- transferred from [facility] to [facility]  Reviewed I/O's: -100 ml x 24 hours  UOP: 100 ml x 24 hours  Per orthopedics notes, plan THA today.   Spoke with pt at bedside, who reports not feeling well today. He shares that he generally consumes 4 meals per day and denies any changes to his eating habits. Per his report, he started HD about a month ago and treatments have been uncomfortable secondary to hip pain. He is unsure of dry weight. Per his report, UBW is around 250-255#, but admits to mild wt loss secondary to fluid losses from HD. He does not  take nutritional supplements, but shares he recently started a new medication that he takes TID with HD, but does not recall the name of the medication.   Reviewed wt hx; wt has been stable over the past 5 months.   Medications reviewed and include vitamin D3, vitamin D6, and sodium bicarbonate.   Discussed importance of good meal and supplement intake to promote healing.   Lab Results  Component Value Date   HGBA1C 6.2 05/02/2020   PTA DM medications are 10 units tresiba daily.   Labs reviewed: Na: 133, Phos: 4.7, Mg: 1.6, K WDL, CBGS: 179-202 (inpatient orders for glycemic control are 0-6 units insulin aspart TID with meals and 8 units insulin detemir daily at bedtime).    NUTRITION - FOCUSED PHYSICAL EXAM:  Flowsheet Row Most Recent Value  Orbital Region No depletion  Upper Arm Region No depletion  Thoracic and Lumbar Region No depletion  Buccal Region No depletion  Temple Region No depletion  Clavicle Bone Region No depletion  Clavicle and Acromion Bone Region No depletion  Scapular Bone Region No depletion  Dorsal Hand No depletion  Patellar Region No depletion  Anterior Thigh Region No depletion  Posterior Calf Region No depletion  Edema (RD Assessment) None  Hair Reviewed  Eyes Reviewed  Mouth Reviewed  Skin Reviewed  Nails Reviewed       Diet Order:   Diet Order             Diet NPO time specified  Diet  effective now                   EDUCATION NEEDS:   Education needs have been addressed  Skin:  Skin Assessment: Reviewed RN Assessment  Last BM:  10/08/20  Height:   Ht Readings from Last 1 Encounters:  10/08/20 5\' 11"  (1.803 m)    Weight:   Wt Readings from Last 1 Encounters:  10/08/20 109.8 kg    Ideal Body Weight:  78.2 kg  BMI:  Body mass index is 33.75 kg/m.  Estimated Nutritional Needs:   Kcal:  2150-2350  Protein:  115-130 grams  Fluid:  1000 ml + UOP    Loistine Chance, RD, LDN, [place] Registered Dietitian II Certified  Diabetes Care and Education Specialist Please refer to [facility] for RD and/or RD on-call/weekend/after hours pager

## 2020-10-09 NOTE — Consult Note (Signed)
Reason for Consult:Right hip fx Referring Physician: Raiford Noble Time called: 0730 Time at bedside: [place] is an 80 y.o. male.  HPI: Andreu fell on 5/28 while on his way to get a dialysis catheter placed. He c/o knee pain and all subsequent diagnostic work was directed there. He was unable to walk and had multiple visits without relief. Eventually someone x-rayed his hip and he was found to have a femoral neck fx. Orthopedic surgery was consulted (he was at [facility]) and recommended transfer to [facility] given his HD and access issues. He lives at home with his wife and did not use any assistive devices to ambulate prior to the fall.  Past Medical History:  Diagnosis Date   Anemia    Chronic kidney disease    Stage 4?    Diabetes mellitus without complication ([place])    Hepatitis    not sure what type - over 20 years ago   History of kidney stones    Hyperlipidemia    Hypertension     Past Surgical History:  Procedure Laterality Date   AV FISTULA PLACEMENT Left 08/09/2019   Procedure: LEFT ARM Basilic  ARTERIOVENOUS  FISTULA CREATION;  Surgeon: Waynetta Sandy, MD;  Location: [place];  Service: Vascular;  Laterality: Left;   [place] Left 11/30/2019   Procedure: LEFT SECOND STAGE [place];  Surgeon: Waynetta Sandy, MD;  Location: [place];  Service: Vascular;  Laterality: Left;   COLONOSCOPY N/A 07/07/2017   Procedure: COLONOSCOPY;  Surgeon: Danie Binder, MD;  [facility];  Service: Endoscopy;  Laterality: N/A;  8:30   IR FLUORO GUIDE CV LINE RIGHT  09/06/2020   IR US GUIDE VASC ACCESS RIGHT  09/06/2020    Family History  Problem Relation Age of Onset   Diabetes Mother    Diabetes Brother    Heart attack Brother    Colon cancer Son    Lung cancer Son     Social History:  reports that he has never smoked. He has never used smokeless tobacco. He reports that he does not drink alcohol and does not use  drugs.  Allergies: No Known Allergies  Medications: I have reviewed the patient's current medications.  Results for orders placed or performed during the hospital encounter of 10/08/20 (from the past 48 hour(s))  Basic metabolic panel     Status: Abnormal   Collection Time: 10/08/20 12:24 PM  Result Value Ref Range   Sodium 132 (L) 135 - 145 mmol/L   Potassium 3.7 3.5 - 5.1 mmol/L   Chloride 97 (L) 98 - 111 mmol/L   CO2 27 22 - 32 mmol/L   Glucose, Bld 318 (H) 70 - 99 mg/dL    Comment: Glucose reference range applies only to samples taken after fasting for at least 8 hours.   BUN 44 (H) 8 - 23 mg/dL   Creatinine, Ser 4.44 (H) 0.61 - 1.24 mg/dL   Calcium 8.5 (L) 8.9 - 10.3 mg/dL   GFR, Estimated 13 (L) >60 mL/min    Comment: (NOTE) Calculated using the CKD-EPI Creatinine Equation (2021)    Anion gap 8 5 - 15    Comment: Performed at [facility], [address], [place], [place] [address]  CBC WITH DIFFERENTIAL     Status: Abnormal   Collection Time: 10/08/20 12:24 PM  Result Value Ref Range   WBC 7.9 4.0 - 10.5 K/uL   RBC 3.22 (L) 4.22 -  5.81 MIL/uL   Hemoglobin 9.9 (L) 13.0 - 17.0 g/dL   HCT 31.4 (L) 39.0 - 52.0 %   MCV 97.5 80.0 - 100.0 fL   MCH 30.7 26.0 - 34.0 pg   MCHC 31.5 30.0 - 36.0 g/dL   RDW 16.5 (H) 11.5 - 15.5 %   Platelets 231 150 - 400 K/uL   nRBC 0.0 0.0 - 0.2 %   Neutrophils Relative % 81 %   Neutro Abs 6.3 1.7 - 7.7 K/uL   Lymphocytes Relative 9 %   Lymphs Abs 0.7 0.7 - 4.0 K/uL   Monocytes Relative 8 %   Monocytes Absolute 0.7 0.1 - 1.0 K/uL   Eosinophils Relative 2 %   Eosinophils Absolute 0.1 0.0 - 0.5 K/uL   Basophils Relative 0 %   Basophils Absolute 0.0 0.0 - 0.1 K/uL   Immature Granulocytes 0 %   Abs Immature Granulocytes 0.03 0.00 - 0.07 K/uL    Comment: Performed at [facility] [address] [place], [place] [address]  Protime-INR     Status: None   Collection Time: 10/08/20 12:24 PM  Result Value Ref Range   Prothrombin Time 13.7 11.4  - 15.2 seconds   INR 1.1 0.8 - 1.2    Comment: (NOTE) INR goal varies based on device and disease states. Performed at [facility], [address] [place], [place] [address]   APTT     Status: None   Collection Time: 10/08/20 12:24 PM  Result Value Ref Range   aPTT 29 24 - 36 seconds    Comment: Performed at [facility], [address] [place], [place] [address]  Type and screen [facility]     Status: None   Collection Time: 10/08/20 12:25 PM  Result Value Ref Range   ABO/RH(D) O POS    Antibody Screen NEG    Sample Expiration      10/11/2020,2359 Performed at [facility], [address], [place], [place] [address]   Resp Panel by RT-PCR (Flu A&B, Covid) Nasopharyngeal Swab     Status: None   Collection Time: 10/08/20  1:44 PM   Specimen: Nasopharyngeal Swab; Nasopharyngeal(NP) swabs in vial transport medium  Result Value Ref Range   SARS Coronavirus 2 by RT PCR NEGATIVE NEGATIVE    Comment: (NOTE) SARS-CoV-2 target nucleic acids are NOT DETECTED.  The SARS-CoV-2 RNA is generally detectable in upper respiratory specimens during the acute phase of infection. The lowest concentration of SARS-CoV-2 viral copies this assay can detect is 138 copies/mL. A negative result does not preclude SARS-Cov-2 infection and should not be used as the sole basis for treatment or other patient management decisions. A negative result may occur with  improper specimen collection/handling, submission of specimen other than nasopharyngeal swab, presence of viral mutation(s) within the areas targeted by this assay, and inadequate number of viral copies(<138 copies/mL). A negative result must be combined with clinical observations, patient history, and epidemiological information. The expected result is Negative.  Fact Sheet for Patients:  EntrepreneurPulse.com.au  Fact Sheet for Healthcare Providers:  IncredibleEmployment.be  This test is no t yet  approved or cleared by the [place] FDA and  has been authorized for detection and/or diagnosis of SARS-CoV-2 by FDA under an Emergency Use Authorization (EUA). This EUA will remain  in effect (meaning this test can be used) for the duration of the COVID-19 declaration under Section 564(b)(1) of the Act, 21 U.S.C.section 360bbb-3(b)(1), unless the authorization is terminated  or revoked sooner.  Influenza A by PCR NEGATIVE NEGATIVE   Influenza B by PCR NEGATIVE NEGATIVE    Comment: (NOTE) The Xpert Xpress SARS-CoV-2/FLU/RSV plus assay is intended as an aid in the diagnosis of influenza from Nasopharyngeal swab specimens and should not be used as a sole basis for treatment. Nasal washings and aspirates are unacceptable for Xpert Xpress SARS-CoV-2/FLU/RSV testing.  Fact Sheet for Patients: EntrepreneurPulse.com.au  Fact Sheet for Healthcare Providers: IncredibleEmployment.be  This test is not yet approved or cleared by the [place] FDA and has been authorized for detection and/or diagnosis of SARS-CoV-2 by FDA under an Emergency Use Authorization (EUA). This EUA will remain in effect (meaning this test can be used) for the duration of the COVID-19 declaration under Section 564(b)(1) of the Act, 21 U.S.C. section 360bbb-3(b)(1), unless the authorization is terminated or revoked.  Performed at [facility], [address], [place], [place] [address]   CBG monitoring, ED     Status: Abnormal   Collection Time: 10/08/20  5:22 PM  Result Value Ref Range   Glucose-Capillary 222 (H) 70 - 99 mg/dL    Comment: Glucose reference range applies only to samples taken after fasting for at least 8 hours.  Glucose, capillary     Status: Abnormal   Collection Time: 10/08/20  9:53 PM  Result Value Ref Range   Glucose-Capillary 179 (H) 70 - 99 mg/dL    Comment: Glucose reference range applies only to samples taken after fasting for at least 8  hours.  Glucose, capillary     Status: Abnormal   Collection Time: 10/09/20  3:52 AM  Result Value Ref Range   Glucose-Capillary 202 (H) 70 - 99 mg/dL    Comment: Glucose reference range applies only to samples taken after fasting for at least 8 hours.  Renal function panel     Status: Abnormal   Collection Time: 10/09/20  5:58 AM  Result Value Ref Range   Sodium 133 (L) 135 - 145 mmol/L   Potassium 3.7 3.5 - 5.1 mmol/L   Chloride 99 98 - 111 mmol/L   CO2 23 22 - 32 mmol/L   Glucose, Bld 219 (H) 70 - 99 mg/dL    Comment: Glucose reference range applies only to samples taken after fasting for at least 8 hours.   BUN 51 (H) 8 - 23 mg/dL   Creatinine, Ser 5.05 (H) 0.61 - 1.24 mg/dL   Calcium 8.9 8.9 - 10.3 mg/dL   Phosphorus 4.7 (H) 2.5 - 4.6 mg/dL   Albumin 2.8 (L) 3.5 - 5.0 g/dL   GFR, Estimated 11 (L) >60 mL/min    Comment: (NOTE) Calculated using the CKD-EPI Creatinine Equation (2021)    Anion gap 11 5 - 15    Comment: Performed at [facility] [address] [place], [place] [address]  CBC     Status: Abnormal   Collection Time: 10/09/20  5:58 AM  Result Value Ref Range   WBC 8.0 4.0 - 10.5 K/uL   RBC 3.22 (L) 4.22 - 5.81 MIL/uL   Hemoglobin 9.9 (L) 13.0 - 17.0 g/dL   HCT 30.2 (L) 39.0 - 52.0 %   MCV 93.8 80.0 - 100.0 fL   MCH 30.7 26.0 - 34.0 pg   MCHC 32.8 30.0 - 36.0 g/dL   RDW 16.1 (H) 11.5 - 15.5 %   Platelets 224 150 - 400 K/uL   nRBC 0.0 0.0 - 0.2 %    Comment: Performed at [facility], [place]  [address] [place], [place] [address]  Magnesium     Status: Abnormal   Collection Time: 10/09/20  5:58 AM  Result Value Ref Range   Magnesium 1.6 (L) 1.7 - 2.4 mg/dL    Comment: Performed at [facility] [address] [place], [place] [address]  Glucose, capillary     Status: Abnormal   Collection Time: 10/09/20  7:47 AM  Result Value Ref Range   Glucose-Capillary 192 (H) 70 - 99 mg/dL    Comment: Glucose reference range applies only to samples  taken after fasting for at least 8 hours.    US Venous Img Lower Bilateral (DVT)  Result Date: 10/08/2020 CLINICAL DATA:  80 year old male with a history of edema EXAM: BILATERAL LOWER EXTREMITY VENOUS DOPPLER ULTRASOUND TECHNIQUE: Gray-scale sonography with graded compression, as well as color Doppler and duplex ultrasound were performed to evaluate the lower extremity deep venous systems from the level of the common femoral vein and including the common femoral, femoral, profunda femoral, popliteal and calf veins including the posterior tibial, peroneal and gastrocnemius veins when visible. The superficial great saphenous vein was also interrogated. Spectral Doppler was utilized to evaluate flow at rest and with distal augmentation maneuvers in the common femoral, femoral and popliteal veins. COMPARISON:  None. FINDINGS: RIGHT LOWER EXTREMITY Common Femoral Vein: No evidence of thrombus. Normal compressibility, respiratory phasicity and response to augmentation. Saphenofemoral Junction: No evidence of thrombus. Normal compressibility and flow on color Doppler imaging. Profunda Femoral Vein: No evidence of thrombus. Normal compressibility and flow on color Doppler imaging. Femoral Vein: No evidence of thrombus. Normal compressibility, respiratory phasicity and response to augmentation. Popliteal Vein: No evidence of thrombus. Normal compressibility, respiratory phasicity and response to augmentation. Calf Veins: No evidence of thrombus. Normal compressibility and flow on color Doppler imaging. Superficial Great Saphenous Vein: No evidence of thrombus. Normal compressibility and flow on color Doppler imaging. Other Findings:  None. LEFT LOWER EXTREMITY Common Femoral Vein: No evidence of thrombus. Normal compressibility, respiratory phasicity and response to augmentation. Saphenofemoral Junction: No evidence of thrombus. Normal compressibility and flow on color Doppler imaging. Profunda Femoral Vein: No  evidence of thrombus. Normal compressibility and flow on color Doppler imaging. Femoral Vein: No evidence of thrombus. Normal compressibility, respiratory phasicity and response to augmentation. Popliteal Vein: No evidence of thrombus. Normal compressibility, respiratory phasicity and response to augmentation. Calf Veins: No evidence of thrombus. Normal compressibility and flow on color Doppler imaging. Superficial Great Saphenous Vein: No evidence of thrombus. Normal compressibility and flow on color Doppler imaging Other Findings:  None. IMPRESSION: Sonographic survey of the left lower extremity negative for DVT Electronically Signed   By: Corrie Mckusick D.O.   On: 10/08/2020 14:55   DG Chest Port 1 View  Result Date: 10/08/2020 CLINICAL DATA:  Fall. EXAM: PORTABLE CHEST 1 VIEW COMPARISON:  08/13/2020. FINDINGS: Dual-lumen catheter noted with tip over right atrium. Cardiomegaly. No pulmonary venous congestion. Low lung volumes with mild bibasilar atelectasis. No pleural effusion or pneumothorax. No acute bony abnormality. IMPRESSION: 1. Dual-lumen catheter noted with tip over right atrium. No pulmonary venous congestion. 2.  Low lung volumes with mild bibasilar atelectasis. Electronically Signed   By: Marcello Moores  Register   On: 10/08/2020 13:18   DG HIP UNILAT W OR W/O PELVIS 2-3 VIEWS RIGHT  Result Date: 10/08/2020 Clinical:  Fall May 27, hip pain X-rays were done of the right hip and pelvis. There is a displaced femoral neck fracture of the right hip with some foreshortening.  Bone quality is good.  Femoral head well located within acetabulum. Impression:  Displaced femoral neck fracture of the right hip. Electronically Signed Sanjuana Kava, MD 6/28/20229:40 AM    Review of Systems  HENT:  Negative for ear discharge, ear pain, hearing loss and tinnitus.   Eyes:  Negative for photophobia and pain.  Respiratory:  Negative for cough and shortness of breath.   Cardiovascular:  Negative for chest pain.   Gastrointestinal:  Negative for abdominal pain, nausea and vomiting.  Genitourinary:  Negative for dysuria, flank pain, frequency and urgency.  Musculoskeletal:  Positive for arthralgias (Right hip/knee). Negative for back pain, myalgias and neck pain.  Neurological:  Negative for dizziness and headaches.  Hematological:  Does not bruise/bleed easily.  Psychiatric/Behavioral:  The patient is not nervous/anxious.   Blood pressure (!) 159/72, pulse 75, temperature 98.4 F (36.9 C), temperature source Oral, resp. rate 18, height 5\' 11"  (1.803 m), weight 109.8 kg, SpO2 100 %. Physical Exam Constitutional:      General: He is not in acute distress.    Appearance: He is well-developed. He is not diaphoretic.  HENT:     Head: Normocephalic and atraumatic.  Eyes:     General: No scleral icterus.       Right eye: No discharge.        Left eye: No discharge.     Conjunctiva/sclera: Conjunctivae normal.  Cardiovascular:     Rate and Rhythm: Normal rate and regular rhythm.  Pulmonary:     Effort: Pulmonary effort is normal. No respiratory distress.  Musculoskeletal:     Cervical back: Normal range of motion.     Comments: RLE No traumatic wounds, ecchymosis, or rash  Mod TTP hip  No knee or ankle effusion  Knee stable to varus/ valgus and anterior/posterior stress  Sens DPN, SPN, TN intact  Motor EHL, ext, flex, evers 5/5  DP 1+, PT 0, No significant edema  Skin:    General: Skin is warm and dry.  Neurological:     Mental Status: He is alert.  Psychiatric:        Mood and Affect: Mood normal.        Behavior: Behavior normal.    Assessment/Plan: Right hip fx -- Plan THA by Dr. Lyla Glassing today. Please keep NPO. ESRD on HD    Lisette Abu, PA-C Orthopedic Surgery [phone] 10/09/2020, 9:04 AM

## 2020-10-09 NOTE — Anesthesia Postprocedure Evaluation (Signed)
Anesthesia Post Note  Patient: Trevor [name]  Procedure(s) Performed: TOTAL HIP ARTHROPLASTY ANTERIOR APPROACH (Right: Hip)     Patient location during evaluation: PACU Anesthesia Type: Spinal Level of consciousness: oriented and awake and alert Pain management: pain level controlled Vital Signs Assessment: post-procedure vital signs reviewed and stable Respiratory status: spontaneous breathing, respiratory function stable and patient connected to nasal cannula oxygen Cardiovascular status: blood pressure returned to baseline and stable Postop Assessment: no headache, no backache and no apparent nausea or vomiting Anesthetic complications: no   No notable events documented.  Last Vitals:  Vitals:   10/09/20 1945 10/09/20 2015  BP: (!) 170/64 140/63  Pulse: 82 81  Resp: 15 16  Temp:    SpO2: 99% 98%    Last Pain:  Vitals:   10/09/20 2058  TempSrc:   PainSc: 0-No pain                 Musab Wingard COKER

## 2020-10-09 NOTE — TOC Progression Note (Signed)
Transition of Care [facility]) - Progression Note    Patient Details  Name: Trevor [name] MRN: [id number] Date of Birth: 1941/03/29  Transition of Care [facility]) CM/SW Contact  Milinda Antis, [place] Phone Number: 10/09/2020, 8:35 AM  Clinical Narrative:    Patient has displaced femoral neck fracture.  TOC following patient for any d/c planning needs once medically stable.  Lind Covert, MSW, LCSWA         Expected Discharge Plan and Services                                                 Social Determinants of Health (SDOH) Interventions    Readmission Risk Interventions No flowsheet data found.

## 2020-10-09 NOTE — Anesthesia Procedure Notes (Signed)
Procedure Name: MAC Date/Time: 10/09/2020 2:42 PM Performed by: Moshe Salisbury, CRNA Pre-anesthesia Checklist: Patient identified, Emergency Drugs available, Suction available and Patient being monitored Patient Re-evaluated:Patient Re-evaluated prior to induction Oxygen Delivery Method: Nasal cannula Placement Confirmation: positive ETCO2 Dental Injury: Teeth and Oropharynx as per pre-operative assessment

## 2020-10-09 NOTE — Op Note (Signed)
OPERATIVE REPORT  SURGEON: Rod Can, MD   ASSISTANT: Cherlynn June, PA-C  PREOPERATIVE DIAGNOSIS: Chronic Displaced Right femoral neck fracture.   POSTOPERATIVE DIAGNOSIS: Chronic Displaced Right femoral neck fracture.   PROCEDURE: Right total hip arthroplasty, anterior approach.   IMPLANTS: DePuy Tri Lock stem, size 6, hi offset. DePuy Pinnacle Cup, size 56 mm. DePuy Altrx liner, size 36 by 56 mm, +4 neutral. DePuy metal head ball, size 36 + 1.5 mm.  ANESTHESIA:  MAC and Spinal  ANTIBIOTICS: 2g ancef.  ESTIMATED BLOOD LOSS:-200 mL    DRAINS: None.  COMPLICATIONS: None   CONDITION: PACU - hemodynamically stable.   BRIEF CLINICAL NOTE: Trevor [name] is a 80 y.o. male with a displaced Right femoral neck fracture. The patient was admitted to the hospitalist service and underwent perioperative risk stratification and medical optimization. The risks, benefits, and alternatives to total hip arthroplasty were explained, and the patient elected to proceed.  PROCEDURE IN DETAIL: The patient was taken to the operating room and general anesthesia was induced on the hospital bed.  The patient was then positioned on the Hana table.  All bony prominences were well padded.  The hip was prepped and draped in the normal sterile surgical fashion.  A time-out was called verifying side and site of surgery. Antibiotics were given within 60 minutes of beginning the procedure.  Bikini incision was made, and the direct anterior approach to the hip was performed through the Hueter interval.  Lateral femoral circumflex vessels were treated with the Auqumantys. The anterior capsule was exposed and an inverted T capsulotomy was made.  There was no fracture hematoma, and the fracture site was filled with scar. The patient was found to have a comminuted Right subcapital femoral neck fracture.  I freshened the femoral neck cut with a saw.  I removed the femoral neck fragment.  A corkscrew was  placed into the head and the head was removed.  This was passed to the back table and was measured. The pubofemoral ligament was released subperiosteally to the lesser trochanter.   Acetabular exposure was achieved, and the pulvinar and labrum were excised. Sequential reaming of the acetabulum was then performed up to a size 55 mm reamer. A 56 mm cup was then opened and impacted into place at approximately 40 degrees of abduction and 20 degrees of anteversion. The final polyethylene liner was impacted into place.    I then gained femoral exposure taking care to protect the abductors and greater trochanter.  This was performed using standard external rotation, extension, and adduction.  The superior capsule was incised longitudinally, taking care to stay lateral to the posterior border of the calcar. A cookie cutter was used to enter the femoral canal, and then the femoral canal finder was used to confirm location.  I then sequentially broached up to a size 6.  Calcar planer was used on the femoral neck remnant.  I paced a hi offset neck and a trial head ball. The hip was reduced.  Leg lengths were checked fluoroscopically.  The hip was dislocated and trial components were removed.  I placed the real stem followed by the real spacer and head ball.  The hip was reduced.  Fluoroscopy was used to confirm component position and leg lengths.  At 90 degrees of external rotation and extension, the hip was stable to an anterior directed force.   The wound was copiously irrigated with Irrisept solution and normal saline using pule lavage.  Marcaine solution was injected into  the periarticular soft tissue.  The wound was closed in layers using #1 Vicryl and V-Loc for the fascia, 2-0 Vicryl for the subcutaneous fat, 2-0 Monocryl for the deep dermal layer, 3-0 running Monocryl subcuticular stitch and glue for the skin.  Once the glue was fully dried, an Aquacell Ag dressing was applied.  The patient was then awakened from  anesthesia and transported to the recovery room in stable condition.  Sponge, needle, and instrument counts were correct at the end of the case x2.  The patient tolerated the procedure well and there were no known complications.  Please note that a surgical assistant was a medical necessity for this procedure to perform it in a safe and expeditious manner. Assistant was necessary to provide appropriate retraction of vital neurovascular structures, to prevent femoral fracture, and to allow for anatomic placement of the prosthesis.

## 2020-10-09 NOTE — Progress Notes (Signed)
PHARMACY NOTE:  ANTIMICROBIAL RENAL DOSAGE ADJUSTMENT  Current antimicrobial regimen includes a mismatch between antimicrobial dosage and estimated renal function.  As per policy approved by the Pharmacy & Therapeutics and Medical Executive Committees, the antimicrobial dosage will be adjusted accordingly.  Current antimicrobial dosage:  Ancef 2gm IV Q6H x 2 doses  Indication: Surgical prophylaxis   Renal Function:  Estimated Creatinine Clearance: 14.9 mL/min (A) (by C-G formula based on SCr of 5.05 mg/dL (H)). [x]      On intermittent HD, scheduled:  MWF, unsure whether patient will still go to HD today []      On CRRT    Antimicrobial dosage has been changed to: no further Ancef needed if no HD today, will give one dose if he does end up receiving HD  Trevor [name], PharmD, BCPS, [place] 10/09/2020, 9:32 PM

## 2020-10-09 NOTE — Anesthesia Preprocedure Evaluation (Signed)
Anesthesia Evaluation  Patient identified by MRN, date of birth, ID band Patient awake    Reviewed: Allergy & Precautions, NPO status , Patient's Chart, lab work & pertinent test results  Airway Mallampati: II  TM Distance: >3 FB Neck ROM: Full    Dental  (+) Poor Dentition   Pulmonary    breath sounds clear to auscultation       Cardiovascular hypertension,  Rhythm:Regular Rate:Normal     Neuro/Psych    GI/Hepatic   Endo/Other  diabetes  Renal/GU      Musculoskeletal   Abdominal   Peds  Hematology   Anesthesia Other Findings   Reproductive/Obstetrics                             Anesthesia Physical Anesthesia Plan  ASA: 3  Anesthesia Plan: MAC and Spinal   Post-op Pain Management:    Induction: Intravenous  PONV Risk Score and Plan: Ondansetron and Propofol infusion  Airway Management Planned: Natural Airway and Simple Face Mask  Additional Equipment:   Intra-op Plan:   Post-operative Plan:   Informed Consent:   Plan Discussed with:   Anesthesia Plan Comments:         Anesthesia Quick Evaluation

## 2020-10-09 NOTE — Interval H&P Note (Signed)
History and Physical Interval Note:  10/09/2020 2:34 PM  Trevor [name]  has presented today for surgery, with the diagnosis of right total hip fracture.  The various methods of treatment have been discussed with the patient and family. After consideration of risks, benefits and other options for treatment, the patient has consented to  Procedure(s): TOTAL HIP ARTHROPLASTY ANTERIOR APPROACH (Right) as a surgical intervention.  The patient's history has been reviewed, patient examined, no change in status, stable for surgery.  I have reviewed the patient's chart and labs.  Questions were answered to the patient's satisfaction.    The risks, benefits, and alternatives were discussed with the patient. There are risks associated with the surgery including, but not limited to, problems with anesthesia (death), infection, instability (giving out of the joint), dislocation, differences in leg length/angulation/rotation, fracture of bones, loosening or failure of implants, hematoma (blood accumulation) which may require surgical drainage, blood clots, pulmonary embolism, nerve injury (foot drop and lateral thigh numbness), and blood vessel injury. The patient understands these risks and elects to proceed.    Hilton Cork Kaisei Gilbo

## 2020-10-09 NOTE — Anesthesia Procedure Notes (Signed)
Spinal  Patient location during procedure: OR Start time: 10/09/2020 2:50 PM End time: 10/09/2020 2:55 PM Staffing Performed: anesthesiologist  Anesthesiologist: Roberts Gaudy, MD Spinal Block Patient position: right lateral decubitus Prep: ChloraPrep Patient monitoring: heart rate, cardiac monitor, continuous pulse ox and blood pressure Location: L3-4 Injection technique: single-shot Needle Needle type: Tuohy  Needle gauge: 22 G Needle length: 9 cm Assessment Events: CSF return Additional Notes 1.8 cc 0.75% Bupivacaine injected easily

## 2020-10-09 NOTE — Progress Notes (Signed)
PROGRESS NOTE    Trevor [name]  BTD:[id number] DOB: 06-20-1940 DOA: 10/08/2020 PCP: Celene Squibb, MD   Brief Narrative:  The patient is a 80 year old obese African-American male with a past medical history significant for but not limited to end-stage renal disease on hemodialysis Monday Wednesday Friday, controlled diabetes mellitus type 2 as well as other comorbidities who presented to ED with hip pain and inability ambulate for about a month.  He went to the [facility] to get hemodialysis port replacement for 12 dizzy and fell.  At the [facility] ED he received a knee x-ray which revealed no injury.  After port replacement discharge patient to experience severe right hip pain rendering him unable to walk for about a month.  He saw Dr. Patrick Jupiter Of orthopedic surgery who ordered a hip x-ray which revealed a displaced femoral neck fracture.  Patient was needing surgery and transferred to [facility] for complex anesthesia with dialysis and orthopedic surgery and nephrology was consulted.  Patient is currently going to go for a right hip total arthroplasty from anterior approach today.  Assessment & Plan:   Principal Problem:   Hip fracture ([place]) Active Problems:   Diabetes mellitus with stage 5 chronic kidney disease (HCC)   Anemia   Gastroesophageal reflux disease   Hyperlipidemia   Neuropathy   End stage renal disease (HCC)   Hyponatremia   Hyperglycemia   Hip pain  Right Closed Displaced Femoral Neck Fracture Acute Hip Pain -Confirmed on imaging on 10/08/20 as an outpatient  -Orthopedic Surgery was consulted and patient to go for Right Total Hip Arthroplasty Anterior Approch  -VTE Prophylaxis per Orthopedic Surgery  -WB Status per Ortho  -PT/OT to evaluate and Treat  -Pain Control with Hydrocodone-Acetaminophen 1-2 tab\ q6hprn Severe Pain, and IV Hydromorphone 0.5 mg q3hprn Severe Pain -Bowel Regimen Post-Op but has Miralaz 17 g po Daily PRN   DM  in setting of ESRD  -stable, last A1c was  6.2 -POC glucose was in 300s yesterday -SSI Novolog 0-6 units TID with meals - Insulin detemir 8 units qhs -Continue to Monitor CBG's per Protocol    ESRD on HD MWF -Creatinine 6-7 at baseline.  -Patient's BUN/Cr went from 44/4.44 -> 51/5.05 -Continiue HDS as scheduled (M, W, F) -Vit D3 1000 units -Nephrology evaluated and Planning for Regular Dialysis today after Surgery and holding Heparin today and minimizing Ultrafiltration   Hypertension  -Patient took home meds this AM (coreg, clonidine, statin). BP systolic 106Y in ED in setting of severe pain. Improved with hydralazine 10mg  IV -C/w Carvedilol 6.25 mg bid PO -C/w Hydralazine 50 mg bid PO -C/w Clonidine 0.2 mg TID - rosuvastatin 10 mg   Hyponatremia  -In setting of ESRD and volume overload -Na+ 132 on Admission and today was 133 -Continue hemodialysis as scheduled  HLD -C/w Rosuvastatin 10 mg po Daily   Anemia of chronic disease  -Baseline Hgb ~8.5-9. Admission was 9.9 -Hgb/Hct went from 9.9/30.2 -> 10.0/31.0 -Expect to Drop Post Operatively -Check Anemia Panel in the AM  -Continue HDS as scheduled   Latent TB -C/w Isoniazide 300 mg po Daily and Pyridoxine 50 mg   Obesity -Complicates overall Prognosis and Care -Estimated body mass index is 33.75 kg/m as calculated from the following:   Height as of this encounter: 5\' 11"  (1.803 m).   Weight as of this encounter: 109.8 kg. -Weight Loss and Dietary Counseling given -Dietary evaluated and recommending 30 mL of Prosource plus 3 times daily as well as  renal multivitamin daily   DVT prophylaxis: Heparin 5,000 units sq q8h Code Status: FULL CODE  Family Communication: No family present at bedside  Disposition Plan: Pending further clearance by Ortho and Nephrology and Evaluation by PT/OT   Status is: Inpatient  Remains inpatient appropriate because:Unsafe d/c plan, IV treatments appropriate due to intensity of illness or inability to take PO, and Inpatient level  of care appropriate due to severity of illness  Dispo: The patient is from: Home              Anticipated d/c is to: SNF              Patient currently is not medically stable to d/c.   Difficult to place patient No  Consultants:  Orthopedic Surgery Nephrology   Procedures: Right Total Hip Arthroplasty Anterior Approach    Antimicrobials:  Anti-infectives (From admission, onward)    Start     Dose/Rate Route Frequency Ordered Stop   10/09/20 1000  isoniazid (NYDRAZID) tablet 300 mg        300 mg Oral Daily 10/08/20 1629     10/09/20 0830  ceFAZolin (ANCEF) IVPB 2g/100 mL premix        2 g 200 mL/hr over 30 Minutes Intravenous On call to O.R. 10/09/20 7412 10/10/20 0559        Subjective: Seen and examined at bedside and was hungry.  Still complaining some pain in his hip that radiated down to his leg.  No nausea or vomiting.  Denies any lightheadedness or dizziness.  Just did not feel well.  No other concerns or complaints at this time.  Objective: Vitals:   10/08/20 2107 10/08/20 2300 10/09/20 0025 10/09/20 0750  BP: (!) 176/77  (!) 156/65 (!) 159/72  Pulse: 74  77 75  Resp: 20  16 18   Temp: 98.5 F (36.9 C)  97.6 F (36.4 C) 98.4 F (36.9 C)  TempSrc: Oral  Oral Oral  SpO2: 100%  97% 100%  Weight:  109.8 kg    Height:  5\' 11"  (1.803 m)      Intake/Output Summary (Last 24 hours) at 10/09/2020 0754 Last data filed at 10/09/2020 0028 Gross per 24 hour  Intake --  Output 100 ml  Net -100 ml   Filed Weights   10/08/20 2300  Weight: 109.8 kg   Examination: Physical Exam:  Constitutional: WN/WD obese African-American male currently in mild distress appears calm but a little uncomfortable Eyes: Lids and conjunctivae normal, sclerae anicteric  ENMT: External Ears, Nose appear normal. Grossly normal hearing.   Neck: Appears normal, supple, no cervical masses, normal ROM, no appreciable thyromegaly: No JVD Respiratory: Diminished to auscultation bilaterally, no  wheezing, rales, rhonchi or crackles. Normal respiratory effort and patient is not tachypenic. No accessory muscle use.  Unlabored breathing Cardiovascular: RRR, no murmurs / rubs / gallops. S1 and S2 auscultated. No appreciable LE Edema Abdomen: Soft, non-tender, Distended 2/2 to body habitus. Bowel sounds positive.  GU: Deferred. Musculoskeletal: No clubbing / cyanosis of digits/nails. No joint deformity upper and lower extremities.  Skin: No rashes, lesions, ulcers on a limited skin evaluation. No induration; Warm and dry.  Neurologic: CN 2-12 grossly intact with no focal deficits. Romberg sign and cerebellar reflexes not assessed.  Psychiatric: Normal judgment and insight. Alert and oriented x 3. Anxious mood and appropriate affect.   Data Reviewed: I have personally reviewed following labs and imaging studies  CBC: Recent Labs  Lab 10/08/20 1224  WBC 7.9  NEUTROABS 6.3  HGB 9.9*  HCT 31.4*  MCV 97.5  PLT 100   Basic Metabolic Panel: Recent Labs  Lab 10/08/20 1224 10/09/20 0558  NA 132* 133*  K 3.7 3.7  CL 97* 99  CO2 27 23  GLUCOSE 318* 219*  BUN 44* 51*  CREATININE 4.44* 5.05*  CALCIUM 8.5* 8.9  PHOS  --  4.7*   GFR: Estimated Creatinine Clearance: 14.9 mL/min (A) (by C-G formula based on SCr of 5.05 mg/dL (H)). Liver Function Tests: Recent Labs  Lab 10/09/20 0558  ALBUMIN 2.8*   No results for input(s): LIPASE, AMYLASE in the last 168 hours. No results for input(s): AMMONIA in the last 168 hours. Coagulation Profile: Recent Labs  Lab 10/08/20 1224  INR 1.1   Cardiac Enzymes: No results for input(s): CKTOTAL, CKMB, CKMBINDEX, TROPONINI in the last 168 hours. BNP (last 3 results) No results for input(s): PROBNP in the last 8760 hours. HbA1C: No results for input(s): HGBA1C in the last 72 hours. CBG: Recent Labs  Lab 10/08/20 1722 10/08/20 2153 10/09/20 0352 10/09/20 0747  GLUCAP 222* 179* 202* 192*   Lipid Profile: No results for input(s):  CHOL, HDL, LDLCALC, TRIG, CHOLHDL, LDLDIRECT in the last 72 hours. Thyroid Function Tests: No results for input(s): TSH, T4TOTAL, FREET4, T3FREE, THYROIDAB in the last 72 hours. Anemia Panel: No results for input(s): VITAMINB12, FOLATE, FERRITIN, TIBC, IRON, RETICCTPCT in the last 72 hours. Sepsis Labs: No results for input(s): PROCALCITON, LATICACIDVEN in the last 168 hours.  Recent Results (from the past 240 hour(s))  Resp Panel by RT-PCR (Flu A&B, Covid) Nasopharyngeal Swab     Status: None   Collection Time: 10/08/20  1:44 PM   Specimen: Nasopharyngeal Swab; Nasopharyngeal(NP) swabs in vial transport medium  Result Value Ref Range Status   SARS Coronavirus 2 by RT PCR NEGATIVE NEGATIVE Final    Comment: (NOTE) SARS-CoV-2 target nucleic acids are NOT DETECTED.  The SARS-CoV-2 RNA is generally detectable in upper respiratory specimens during the acute phase of infection. The lowest concentration of SARS-CoV-2 viral copies this assay can detect is 138 copies/mL. A negative result does not preclude SARS-Cov-2 infection and should not be used as the sole basis for treatment or other patient management decisions. A negative result may occur with  improper specimen collection/handling, submission of specimen other than nasopharyngeal swab, presence of viral mutation(s) within the areas targeted by this assay, and inadequate number of viral copies(<138 copies/mL). A negative result must be combined with clinical observations, patient history, and epidemiological information. The expected result is Negative.  Fact Sheet for Patients:  EntrepreneurPulse.com.au  Fact Sheet for Healthcare Providers:  IncredibleEmployment.be  This test is no t yet approved or cleared by the [place] FDA and  has been authorized for detection and/or diagnosis of SARS-CoV-2 by FDA under an Emergency Use Authorization (EUA). This EUA will remain  in effect (meaning  this test can be used) for the duration of the COVID-19 declaration under Section 564(b)(1) of the Act, 21 U.S.C.section 360bbb-3(b)(1), unless the authorization is terminated  or revoked sooner.       Influenza A by PCR NEGATIVE NEGATIVE Final   Influenza B by PCR NEGATIVE NEGATIVE Final    Comment: (NOTE) The Xpert Xpress SARS-CoV-2/FLU/RSV plus assay is intended as an aid in the diagnosis of influenza from Nasopharyngeal swab specimens and should not be used as a sole basis for treatment. Nasal washings and aspirates are unacceptable for Xpert Xpress SARS-CoV-2/FLU/RSV testing.  Fact Sheet for  Patients: EntrepreneurPulse.com.au  Fact Sheet for Healthcare Providers: IncredibleEmployment.be  This test is not yet approved or cleared by the [place] FDA and has been authorized for detection and/or diagnosis of SARS-CoV-2 by FDA under an Emergency Use Authorization (EUA). This EUA will remain in effect (meaning this test can be used) for the duration of the COVID-19 declaration under Section 564(b)(1) of the Act, 21 U.S.C. section 360bbb-3(b)(1), unless the authorization is terminated or revoked.  Performed at [facility], [address] [place], [place] [address]      RN Pressure Injury Documentation:     Estimated body mass index is 33.75 kg/m as calculated from the following:   Height as of this encounter: 5\' 11"  (1.803 m).   Weight as of this encounter: 109.8 kg.  Malnutrition Type:  Malnutrition Characteristics:   Nutrition Interventions:     Radiology Studies: US Venous Img Lower Bilateral (DVT)  Result Date: 10/08/2020 CLINICAL DATA:  80 year old male with a history of edema EXAM: BILATERAL LOWER EXTREMITY VENOUS DOPPLER ULTRASOUND TECHNIQUE: Gray-scale sonography with graded compression, as well as color Doppler and duplex ultrasound were performed to evaluate the lower extremity deep venous systems from the level of  the common femoral vein and including the common femoral, femoral, profunda femoral, popliteal and calf veins including the posterior tibial, peroneal and gastrocnemius veins when visible. The superficial great saphenous vein was also interrogated. Spectral Doppler was utilized to evaluate flow at rest and with distal augmentation maneuvers in the common femoral, femoral and popliteal veins. COMPARISON:  None. FINDINGS: RIGHT LOWER EXTREMITY Common Femoral Vein: No evidence of thrombus. Normal compressibility, respiratory phasicity and response to augmentation. Saphenofemoral Junction: No evidence of thrombus. Normal compressibility and flow on color Doppler imaging. Profunda Femoral Vein: No evidence of thrombus. Normal compressibility and flow on color Doppler imaging. Femoral Vein: No evidence of thrombus. Normal compressibility, respiratory phasicity and response to augmentation. Popliteal Vein: No evidence of thrombus. Normal compressibility, respiratory phasicity and response to augmentation. Calf Veins: No evidence of thrombus. Normal compressibility and flow on color Doppler imaging. Superficial Great Saphenous Vein: No evidence of thrombus. Normal compressibility and flow on color Doppler imaging. Other Findings:  None. LEFT LOWER EXTREMITY Common Femoral Vein: No evidence of thrombus. Normal compressibility, respiratory phasicity and response to augmentation. Saphenofemoral Junction: No evidence of thrombus. Normal compressibility and flow on color Doppler imaging. Profunda Femoral Vein: No evidence of thrombus. Normal compressibility and flow on color Doppler imaging. Femoral Vein: No evidence of thrombus. Normal compressibility, respiratory phasicity and response to augmentation. Popliteal Vein: No evidence of thrombus. Normal compressibility, respiratory phasicity and response to augmentation. Calf Veins: No evidence of thrombus. Normal compressibility and flow on color Doppler imaging. Superficial  Great Saphenous Vein: No evidence of thrombus. Normal compressibility and flow on color Doppler imaging Other Findings:  None. IMPRESSION: Sonographic survey of the left lower extremity negative for DVT Electronically Signed   By: Corrie Mckusick D.O.   On: 10/08/2020 14:55   DG Chest Port 1 View  Result Date: 10/08/2020 CLINICAL DATA:  Fall. EXAM: PORTABLE CHEST 1 VIEW COMPARISON:  08/13/2020. FINDINGS: Dual-lumen catheter noted with tip over right atrium. Cardiomegaly. No pulmonary venous congestion. Low lung volumes with mild bibasilar atelectasis. No pleural effusion or pneumothorax. No acute bony abnormality. IMPRESSION: 1. Dual-lumen catheter noted with tip over right atrium. No pulmonary venous congestion. 2.  Low lung volumes with mild bibasilar atelectasis. Electronically Signed   By: Marcello Moores  Register   On: 10/08/2020 13:18  DG HIP UNILAT W OR W/O PELVIS 2-3 VIEWS RIGHT  Result Date: 10/08/2020 Clinical:  Fall May 27, hip pain X-rays were done of the right hip and pelvis. There is a displaced femoral neck fracture of the right hip with some foreshortening.  Bone quality is good.  Femoral head well located within acetabulum. Impression:  Displaced femoral neck fracture of the right hip. Electronically Signed Sanjuana Kava, MD 6/28/20229:40 AM    Scheduled Meds:  carvedilol  6.25 mg Oral BID WC   chlorhexidine  60 mL Topical Once   cholecalciferol  1,000 Units Oral Daily   cloNIDine  0.2 mg Oral TID   heparin  5,000 Units Subcutaneous Q8H   hydrALAZINE  50 mg Oral BID   insulin aspart  0-6 Units Subcutaneous TID WC   insulin detemir  8 Units Subcutaneous QHS   isoniazid  300 mg Oral Daily   povidone-iodine  2 application Topical Once   pyridOXINE  50 mg Oral Daily   rosuvastatin  10 mg Oral Daily   sodium bicarbonate  650 mg Oral TID   Continuous Infusions:   ceFAZolin (ANCEF) IV     tranexamic acid      LOS: 1 day   Kerney Elbe, DO [facility] PAGER is on  AMION  If 7PM-7AM, please contact night-coverage [email or web address]

## 2020-10-09 NOTE — Transfer of Care (Signed)
Immediate Anesthesia Transfer of Care Note  Patient: Trevor [name]  Procedure(s) Performed: TOTAL HIP ARTHROPLASTY ANTERIOR APPROACH (Right: Hip)  Patient Location: PACU  Anesthesia Type:Spinal  Level of Consciousness: drowsy and patient cooperative  Airway & Oxygen Therapy: Patient Spontanous Breathing and Patient connected to nasal cannula oxygen  Post-op Assessment: Report given to RN, Post -op Vital signs reviewed and stable and Patient moving all extremities  Post vital signs: Reviewed and stable  Last Vitals:  Vitals Value Taken Time  BP 125/56 10/09/20 1715  Temp    Pulse 57 10/09/20 1716  Resp 13 10/09/20 1716  SpO2 100 % 10/09/20 1716  Vitals shown include unvalidated device data.  Last Pain:  Vitals:   10/09/20 1331  TempSrc: Axillary  PainSc:          Complications: No notable events documented.

## 2020-10-09 NOTE — Consult Note (Signed)
[facility] Nephrology Consult Note: Reason for Consult: To manage dialysis and dialysis related needs Referring Physician: Dr Alfredia Ferguson, Georgina Quint  HPI:  Trevor [name] is an 80 y.o. male with history of hypertension, DM, HLD, nephrolithiasis, ESRD on HD MWF at [facility] followed by Dr. Theador Hawthorne, transferred from [facility] for displaced femoral neck fracture, seen as a consultation for the management of ESRD.  Patient reported that he was standing and while turning around he fell and had a fracture.  He was recently started on dialysis about a month ago.  His last dialysis was on Monday.  His treatment is about 4 hours via right IJ TDC.  He does not remember his dry weight but reports that they usually remove around 1 to 2 kg.  He is on heparin IV.  The AV fistula in his left arm was felt. Today, he is lying on bed comfortable and reports some discomfort at the fracture site.  Seen by orthopedics and plan for surgery this afternoon.  He denies chest pain, shortness of breath, nausea, vomiting, headache or dizziness. The labs showed potassium 3.7, BUN 51, hemoglobin 9.9.  Chest x-ray with no pulmonary edema or congestion.  Past Medical History:  Diagnosis Date   Anemia    Chronic kidney disease    Stage 4?    Diabetes mellitus without complication ([place])    Hepatitis    not sure what type - over 20 years ago   History of kidney stones    Hyperlipidemia    Hypertension     Past Surgical History:  Procedure Laterality Date   AV FISTULA PLACEMENT Left 08/09/2019   Procedure: LEFT ARM Basilic  ARTERIOVENOUS  FISTULA CREATION;  Surgeon: Waynetta Sandy, MD;  Location: [facility];  Service: Vascular;  Laterality: Left;   [place] Left 11/30/2019   Procedure: LEFT SECOND STAGE [place];  Surgeon: Waynetta Sandy, MD;  Location: [place];  Service: Vascular;  Laterality: Left;   COLONOSCOPY N/A 07/07/2017   Procedure:  COLONOSCOPY;  Surgeon: Danie Binder, MD;  [facility];  Service: Endoscopy;  Laterality: N/A;  8:30   IR FLUORO GUIDE CV LINE RIGHT  09/06/2020   IR US GUIDE VASC ACCESS RIGHT  09/06/2020    Family History  Problem Relation Age of Onset   Diabetes Mother    Diabetes Brother    Heart attack Brother    Colon cancer Son    Lung cancer Son     Social History:  reports that he has never smoked. He has never used smokeless tobacco. He reports that he does not drink alcohol and does not use drugs.  Allergies: No Known Allergies  Medications: I have reviewed the patient's current medications.   Results for orders placed or performed during the hospital encounter of 10/08/20 (from the past 48 hour(s))  Basic metabolic panel     Status: Abnormal   Collection Time: 10/08/20 12:24 PM  Result Value Ref Range   Sodium 132 (L) 135 - 145 mmol/L   Potassium 3.7 3.5 - 5.1 mmol/L   Chloride 97 (L) 98 - 111 mmol/L   CO2 27 22 - 32 mmol/L   Glucose, Bld 318 (H) 70 - 99 mg/dL    Comment: Glucose reference range applies only to samples taken after fasting for at least 8 hours.   BUN 44 (H) 8 - 23 mg/dL   Creatinine, Ser 4.44 (H) 0.61 - 1.24 mg/dL  Calcium 8.5 (L) 8.9 - 10.3 mg/dL   GFR, Estimated 13 (L) >60 mL/min    Comment: (NOTE) Calculated using the CKD-EPI Creatinine Equation (2021)    Anion gap 8 5 - 15    Comment: Performed at [facility], [address] [place], [place] [address]  CBC WITH DIFFERENTIAL     Status: Abnormal   Collection Time: 10/08/20 12:24 PM  Result Value Ref Range   WBC 7.9 4.0 - 10.5 K/uL   RBC 3.22 (L) 4.22 - 5.81 MIL/uL   Hemoglobin 9.9 (L) 13.0 - 17.0 g/dL   HCT 31.4 (L) 39.0 - 52.0 %   MCV 97.5 80.0 - 100.0 fL   MCH 30.7 26.0 - 34.0 pg   MCHC 31.5 30.0 - 36.0 g/dL   RDW 16.5 (H) 11.5 - 15.5 %   Platelets 231 150 - 400 K/uL   nRBC 0.0 0.0 - 0.2 %   Neutrophils Relative % 81 %   Neutro Abs 6.3 1.7 - 7.7 K/uL   Lymphocytes Relative 9 %    Lymphs Abs 0.7 0.7 - 4.0 K/uL   Monocytes Relative 8 %   Monocytes Absolute 0.7 0.1 - 1.0 K/uL   Eosinophils Relative 2 %   Eosinophils Absolute 0.1 0.0 - 0.5 K/uL   Basophils Relative 0 %   Basophils Absolute 0.0 0.0 - 0.1 K/uL   Immature Granulocytes 0 %   Abs Immature Granulocytes 0.03 0.00 - 0.07 K/uL    Comment: Performed at [facility], [address], [place], [place] [address]  Protime-INR     Status: None   Collection Time: 10/08/20 12:24 PM  Result Value Ref Range   Prothrombin Time 13.7 11.4 - 15.2 seconds   INR 1.1 0.8 - 1.2    Comment: (NOTE) INR goal varies based on device and disease states. Performed at [facility], [address], [place], [place] [address]   APTT     Status: None   Collection Time: 10/08/20 12:24 PM  Result Value Ref Range   aPTT 29 24 - 36 seconds    Comment: Performed at [facility], [address] [place], [place] [address]  Type and screen [facility]     Status: None   Collection Time: 10/08/20 12:25 PM  Result Value Ref Range   ABO/RH(D) O POS    Antibody Screen NEG    Sample Expiration      10/11/2020,2359 Performed at [facility], [address], [place], [place] [address]   Resp Panel by RT-PCR (Flu A&B, Covid) Nasopharyngeal Swab     Status: None   Collection Time: 10/08/20  1:44 PM   Specimen: Nasopharyngeal Swab; Nasopharyngeal(NP) swabs in vial transport medium  Result Value Ref Range   SARS Coronavirus 2 by RT PCR NEGATIVE NEGATIVE    Comment: (NOTE) SARS-CoV-2 target nucleic acids are NOT DETECTED.  The SARS-CoV-2 RNA is generally detectable in upper respiratory specimens during the acute phase of infection. The lowest concentration of SARS-CoV-2 viral copies this assay can detect is 138 copies/mL. A negative result does not preclude SARS-Cov-2 infection and should not be used as the sole basis for treatment or other patient management decisions. A negative result may occur with  improper specimen  collection/handling, submission of specimen other than nasopharyngeal swab, presence of viral mutation(s) within the areas targeted by this assay, and inadequate number of viral copies(<138 copies/mL). A negative result must be combined with clinical observations, patient history, and epidemiological information. The expected result is  Negative.  Fact Sheet for Patients:  EntrepreneurPulse.com.au  Fact Sheet for Healthcare Providers:  IncredibleEmployment.be  This test is no t yet approved or cleared by the [place] FDA and  has been authorized for detection and/or diagnosis of SARS-CoV-2 by FDA under an Emergency Use Authorization (EUA). This EUA will remain  in effect (meaning this test can be used) for the duration of the COVID-19 declaration under Section 564(b)(1) of the Act, 21 U.S.C.section 360bbb-3(b)(1), unless the authorization is terminated  or revoked sooner.       Influenza A by PCR NEGATIVE NEGATIVE   Influenza B by PCR NEGATIVE NEGATIVE    Comment: (NOTE) The Xpert Xpress SARS-CoV-2/FLU/RSV plus assay is intended as an aid in the diagnosis of influenza from Nasopharyngeal swab specimens and should not be used as a sole basis for treatment. Nasal washings and aspirates are unacceptable for Xpert Xpress SARS-CoV-2/FLU/RSV testing.  Fact Sheet for Patients: EntrepreneurPulse.com.au  Fact Sheet for Healthcare Providers: IncredibleEmployment.be  This test is not yet approved or cleared by the [place] FDA and has been authorized for detection and/or diagnosis of SARS-CoV-2 by FDA under an Emergency Use Authorization (EUA). This EUA will remain in effect (meaning this test can be used) for the duration of the COVID-19 declaration under Section 564(b)(1) of the Act, 21 U.S.C. section 360bbb-3(b)(1), unless the authorization is terminated or revoked.  Performed at [facility],  [address] [place], [place] [address]   CBG monitoring, ED     Status: Abnormal   Collection Time: 10/08/20  5:22 PM  Result Value Ref Range   Glucose-Capillary 222 (H) 70 - 99 mg/dL    Comment: Glucose reference range applies only to samples taken after fasting for at least 8 hours.  Glucose, capillary     Status: Abnormal   Collection Time: 10/08/20  9:53 PM  Result Value Ref Range   Glucose-Capillary 179 (H) 70 - 99 mg/dL    Comment: Glucose reference range applies only to samples taken after fasting for at least 8 hours.  Glucose, capillary     Status: Abnormal   Collection Time: 10/09/20  3:52 AM  Result Value Ref Range   Glucose-Capillary 202 (H) 70 - 99 mg/dL    Comment: Glucose reference range applies only to samples taken after fasting for at least 8 hours.  Surgical pcr screen     Status: None   Collection Time: 10/09/20  3:58 AM   Specimen: Nasal Mucosa; Nasal Swab  Result Value Ref Range   MRSA, PCR NEGATIVE NEGATIVE   Staphylococcus aureus NEGATIVE NEGATIVE    Comment: (NOTE) The Xpert SA Assay (FDA approved for NASAL specimens in patients 51 years of age and older), is one component of a comprehensive surveillance program. It is not intended to diagnose infection nor to guide or monitor treatment. Performed at [facility], [place] [address], [place], [place] [address]   Renal function panel     Status: Abnormal   Collection Time: 10/09/20  5:58 AM  Result Value Ref Range   Sodium 133 (L) 135 - 145 mmol/L   Potassium 3.7 3.5 - 5.1 mmol/L   Chloride 99 98 - 111 mmol/L   CO2 23 22 - 32 mmol/L   Glucose, Bld 219 (H) 70 - 99 mg/dL    Comment: Glucose reference range applies only to samples taken after fasting for at least 8 hours.   BUN 51 (H) 8 - 23 mg/dL   Creatinine, Ser 5.05 (H)  0.61 - 1.24 mg/dL   Calcium 8.9 8.9 - 10.3 mg/dL   Phosphorus 4.7 (H) 2.5 - 4.6 mg/dL   Albumin 2.8 (L) 3.5 - 5.0 g/dL   GFR, Estimated 11 (L) >60 mL/min    Comment:  (NOTE) Calculated using the CKD-EPI Creatinine Equation (2021)    Anion gap 11 5 - 15    Comment: Performed at [facility] [address], [place], [place] [address]  CBC     Status: Abnormal   Collection Time: 10/09/20  5:58 AM  Result Value Ref Range   WBC 8.0 4.0 - 10.5 K/uL   RBC 3.22 (L) 4.22 - 5.81 MIL/uL   Hemoglobin 9.9 (L) 13.0 - 17.0 g/dL   HCT 30.2 (L) 39.0 - 52.0 %   MCV 93.8 80.0 - 100.0 fL   MCH 30.7 26.0 - 34.0 pg   MCHC 32.8 30.0 - 36.0 g/dL   RDW 16.1 (H) 11.5 - 15.5 %   Platelets 224 150 - 400 K/uL   nRBC 0.0 0.0 - 0.2 %    Comment: Performed at [facility], [place] [address], [place], [place] [address]  Magnesium     Status: Abnormal   Collection Time: 10/09/20  5:58 AM  Result Value Ref Range   Magnesium 1.6 (L) 1.7 - 2.4 mg/dL    Comment: Performed at [facility] [address] [place], [place] [address]  Glucose, capillary     Status: Abnormal   Collection Time: 10/09/20  7:47 AM  Result Value Ref Range   Glucose-Capillary 192 (H) 70 - 99 mg/dL    Comment: Glucose reference range applies only to samples taken after fasting for at least 8 hours.  Type and screen [place]     Status: None (Preliminary result)   Collection Time: 10/09/20 10:11 AM  Result Value Ref Range   ABO/RH(D) PENDING    Antibody Screen PENDING    Sample Expiration      10/12/2020,2359 Performed at [facility], [place] [address], [place],  [address]     US Venous Img Lower Bilateral (DVT)  Result Date: 10/08/2020 CLINICAL DATA:  80 year old male with a history of edema EXAM: BILATERAL LOWER EXTREMITY VENOUS DOPPLER ULTRASOUND TECHNIQUE: Gray-scale sonography with graded compression, as well as color Doppler and duplex ultrasound were performed to evaluate the lower extremity deep venous systems from the level of the common femoral vein and including the common femoral, femoral, profunda femoral, popliteal and calf veins including the  posterior tibial, peroneal and gastrocnemius veins when visible. The superficial great saphenous vein was also interrogated. Spectral Doppler was utilized to evaluate flow at rest and with distal augmentation maneuvers in the common femoral, femoral and popliteal veins. COMPARISON:  None. FINDINGS: RIGHT LOWER EXTREMITY Common Femoral Vein: No evidence of thrombus. Normal compressibility, respiratory phasicity and response to augmentation. Saphenofemoral Junction: No evidence of thrombus. Normal compressibility and flow on color Doppler imaging. Profunda Femoral Vein: No evidence of thrombus. Normal compressibility and flow on color Doppler imaging. Femoral Vein: No evidence of thrombus. Normal compressibility, respiratory phasicity and response to augmentation. Popliteal Vein: No evidence of thrombus. Normal compressibility, respiratory phasicity and response to augmentation. Calf Veins: No evidence of thrombus. Normal compressibility and flow on color Doppler imaging. Superficial Great Saphenous Vein: No evidence of thrombus. Normal compressibility and flow on color Doppler imaging. Other Findings:  None. LEFT LOWER EXTREMITY Common Femoral Vein: No evidence of thrombus. Normal compressibility, respiratory  phasicity and response to augmentation. Saphenofemoral Junction: No evidence of thrombus. Normal compressibility and flow on color Doppler imaging. Profunda Femoral Vein: No evidence of thrombus. Normal compressibility and flow on color Doppler imaging. Femoral Vein: No evidence of thrombus. Normal compressibility, respiratory phasicity and response to augmentation. Popliteal Vein: No evidence of thrombus. Normal compressibility, respiratory phasicity and response to augmentation. Calf Veins: No evidence of thrombus. Normal compressibility and flow on color Doppler imaging. Superficial Great Saphenous Vein: No evidence of thrombus. Normal compressibility and flow on color Doppler imaging Other Findings:  None.  IMPRESSION: Sonographic survey of the left lower extremity negative for DVT Electronically Signed   By: Corrie Mckusick D.O.   On: 10/08/2020 14:55   DG Chest Port 1 View  Result Date: 10/08/2020 CLINICAL DATA:  Fall. EXAM: PORTABLE CHEST 1 VIEW COMPARISON:  08/13/2020. FINDINGS: Dual-lumen catheter noted with tip over right atrium. Cardiomegaly. No pulmonary venous congestion. Low lung volumes with mild bibasilar atelectasis. No pleural effusion or pneumothorax. No acute bony abnormality. IMPRESSION: 1. Dual-lumen catheter noted with tip over right atrium. No pulmonary venous congestion. 2.  Low lung volumes with mild bibasilar atelectasis. Electronically Signed   By: Marcello Moores  Register   On: 10/08/2020 13:18   DG HIP UNILAT W OR W/O PELVIS 2-3 VIEWS RIGHT  Result Date: 10/08/2020 Clinical:  Fall May 27, hip pain X-rays were done of the right hip and pelvis. There is a displaced femoral neck fracture of the right hip with some foreshortening.  Bone quality is good.  Femoral head well located within acetabulum. Impression:  Displaced femoral neck fracture of the right hip. Electronically Signed Sanjuana Kava, MD 6/28/20229:40 AM    ROS: As per H&P.  Rest of the systems reviewed and are negative. Blood pressure (!) 159/72, pulse 75, temperature 98.4 F (36.9 C), temperature source Oral, resp. rate 18, height 5\' 11"  (1.803 m), weight 109.8 kg, SpO2 100 %. Gen: NAD, comfortable Respiratory: Clear bilateral, no wheezing or crackle Cardiovascular: Regular rate rhythm S1-S2 normal, no rubs GI: Abdomen soft, nontender, nondistended Extremities, no cyanosis or clubbing, no edema Skin: No rash or ulcer Neurology: Alert, awake, following commands, oriented Dialysis Access: Right IJ TDC.  Assessment/Plan:  #Right femoral neck fracture: Seen by orthopedics and plan for surgical intervention this afternoon.  # ESRD: MWF at [facility].  Electrolytes and volume status acceptable.  Plan for regular  dialysis today.  It will be after the surgery.  We will hold heparin today and minimize ultrafiltration.  Right IJ TDC for the access.  # Hypertension: Continue carvedilol.  Monitor blood pressure.  Volume management by ultrafiltration.  # Anemia of ESRD: Hemoglobin borderline.  We will obtain outpatient record for iron and ESA.  Monitor hemoglobin.  # Metabolic Bone Disease: Calcium and phosphorus level at goal.  Thank you for the consult.  We will follow with you.  Rubens Cranston Tanna Furry 10/09/2020, 11:02 AM

## 2020-10-09 NOTE — H&P (View-Only) (Signed)
Reason for Consult:Right hip fx Referring Physician: Raiford Noble Time called: 0730 Time at bedside: [name] is an 80 y.o. male.  HPI: Trevor [name] fell on 5/28 while on his way to get a dialysis catheter placed. Trevor [name] c/o knee pain and all subsequent diagnostic work was directed there. Trevor [name] was unable to walk and had multiple visits without relief. Eventually someone x-rayed his hip and Trevor [name] was found to have a femoral neck fx. Orthopedic surgery was consulted (Trevor [name] was at [facility]) and recommended transfer to [facility] given his HD and access issues. Trevor [name] lives at home with his wife and did not use any assistive devices to ambulate prior to the fall.  Past Medical History:  Diagnosis Date   Anemia    Chronic kidney disease    Stage 4?    Diabetes mellitus without complication ([place])    Hepatitis    not sure what type - over 20 years ago   History of kidney stones    Hyperlipidemia    Hypertension     Past Surgical History:  Procedure Laterality Date   AV FISTULA PLACEMENT Left 08/09/2019   Procedure: LEFT ARM Basilic  ARTERIOVENOUS  FISTULA CREATION;  Surgeon: Waynetta Sandy, MD;  Location: [place];  Service: Vascular;  Laterality: Left;   [place] Left 11/30/2019   Procedure: LEFT SECOND STAGE [place];  Surgeon: Waynetta Sandy, MD;  Location: [place];  Service: Vascular;  Laterality: Left;   COLONOSCOPY N/A 07/07/2017   Procedure: COLONOSCOPY;  Surgeon: Danie Binder, MD;  [facility];  Service: Endoscopy;  Laterality: N/A;  8:30   IR FLUORO GUIDE CV LINE RIGHT  09/06/2020   IR US GUIDE VASC ACCESS RIGHT  09/06/2020    Family History  Problem Relation Age of Onset   Diabetes Mother    Diabetes Brother    Heart attack Brother    Colon cancer Son    Lung cancer Son     Social History:  reports that Trevor [name] has never smoked. Trevor [name] has never used smokeless tobacco. Trevor [name] reports that Trevor [name] does not drink alcohol and does not use  drugs.  Allergies: No Known Allergies  Medications: I have reviewed the patient's current medications.  Results for orders placed or performed during the hospital encounter of 10/08/20 (from the past 48 hour(s))  Basic metabolic panel     Status: Abnormal   Collection Time: 10/08/20 12:24 PM  Result Value Ref Range   Sodium 132 (L) 135 - 145 mmol/L   Potassium 3.7 3.5 - 5.1 mmol/L   Chloride 97 (L) 98 - 111 mmol/L   CO2 27 22 - 32 mmol/L   Glucose, Bld 318 (H) 70 - 99 mg/dL    Comment: Glucose reference range applies only to samples taken after fasting for at least 8 hours.   BUN 44 (H) 8 - 23 mg/dL   Creatinine, Ser 4.44 (H) 0.61 - 1.24 mg/dL   Calcium 8.5 (L) 8.9 - 10.3 mg/dL   GFR, Estimated 13 (L) >60 mL/min    Comment: (NOTE) Calculated using the CKD-EPI Creatinine Equation (2021)    Anion gap 8 5 - 15    Comment: Performed at [facility], [address] [place], [place] [address]  CBC WITH DIFFERENTIAL     Status: Abnormal   Collection Time: 10/08/20 12:24 PM  Result Value Ref Range   WBC 7.9 4.0 - 10.5 K/uL   RBC 3.22 (L) 4.22 -  5.81 MIL/uL   Hemoglobin 9.9 (L) 13.0 - 17.0 g/dL   HCT 31.4 (L) 39.0 - 52.0 %   MCV 97.5 80.0 - 100.0 fL   MCH 30.7 26.0 - 34.0 pg   MCHC 31.5 30.0 - 36.0 g/dL   RDW 16.5 (H) 11.5 - 15.5 %   Platelets 231 150 - 400 K/uL   nRBC 0.0 0.0 - 0.2 %   Neutrophils Relative % 81 %   Neutro Abs 6.3 1.7 - 7.7 K/uL   Lymphocytes Relative 9 %   Lymphs Abs 0.7 0.7 - 4.0 K/uL   Monocytes Relative 8 %   Monocytes Absolute 0.7 0.1 - 1.0 K/uL   Eosinophils Relative 2 %   Eosinophils Absolute 0.1 0.0 - 0.5 K/uL   Basophils Relative 0 %   Basophils Absolute 0.0 0.0 - 0.1 K/uL   Immature Granulocytes 0 %   Abs Immature Granulocytes 0.03 0.00 - 0.07 K/uL    Comment: Performed at [facility], [address], [place], [place] [address]  Protime-INR     Status: None   Collection Time: 10/08/20 12:24 PM  Result Value Ref Range   Prothrombin Time 13.7 11.4  - 15.2 seconds   INR 1.1 0.8 - 1.2    Comment: (NOTE) INR goal varies based on device and disease states. Performed at [facility], [address] [place], [place] [address]   APTT     Status: None   Collection Time: 10/08/20 12:24 PM  Result Value Ref Range   aPTT 29 24 - 36 seconds    Comment: Performed at [facility], [address], [place], [place] [address]  Type and screen [facility]     Status: None   Collection Time: 10/08/20 12:25 PM  Result Value Ref Range   ABO/RH(D) O POS    Antibody Screen NEG    Sample Expiration      10/11/2020,2359 Performed at [facility] [address], [place], [place] [address]   Resp Panel by RT-PCR (Flu A&B, Covid) Nasopharyngeal Swab     Status: None   Collection Time: 10/08/20  1:44 PM   Specimen: Nasopharyngeal Swab; Nasopharyngeal(NP) swabs in vial transport medium  Result Value Ref Range   SARS Coronavirus 2 by RT PCR NEGATIVE NEGATIVE    Comment: (NOTE) SARS-CoV-2 target nucleic acids are NOT DETECTED.  The SARS-CoV-2 RNA is generally detectable in upper respiratory specimens during the acute phase of infection. The lowest concentration of SARS-CoV-2 viral copies this assay can detect is 138 copies/mL. A negative result does not preclude SARS-Cov-2 infection and should not be used as the sole basis for treatment or other patient management decisions. A negative result may occur with  improper specimen collection/handling, submission of specimen other than nasopharyngeal swab, presence of viral mutation(s) within the areas targeted by this assay, and inadequate number of viral copies(<138 copies/mL). A negative result must be combined with clinical observations, patient history, and epidemiological information. The expected result is Negative.  Fact Sheet for Patients:  EntrepreneurPulse.com.au  Fact Sheet for Healthcare Providers:  IncredibleEmployment.be  This test is no t yet  approved or cleared by the [place] FDA and  has been authorized for detection and/or diagnosis of SARS-CoV-2 by FDA under an Emergency Use Authorization (EUA). This EUA will remain  in effect (meaning this test can be used) for the duration of the COVID-19 declaration under Section 564(b)(1) of the Act, 21 U.S.C.section 360bbb-3(b)(1), unless the authorization is terminated  or revoked sooner.  Influenza A by PCR NEGATIVE NEGATIVE   Influenza B by PCR NEGATIVE NEGATIVE    Comment: (NOTE) The Xpert Xpress SARS-CoV-2/FLU/RSV plus assay is intended as an aid in the diagnosis of influenza from Nasopharyngeal swab specimens and should not be used as a sole basis for treatment. Nasal washings and aspirates are unacceptable for Xpert Xpress SARS-CoV-2/FLU/RSV testing.  Fact Sheet for Patients: EntrepreneurPulse.com.au  Fact Sheet for Healthcare Providers: IncredibleEmployment.be  This test is not yet approved or cleared by the [place] FDA and has been authorized for detection and/or diagnosis of SARS-CoV-2 by FDA under an Emergency Use Authorization (EUA). This EUA will remain in effect (meaning this test can be used) for the duration of the COVID-19 declaration under Section 564(b)(1) of the Act, 21 U.S.C. section 360bbb-3(b)(1), unless the authorization is terminated or revoked.  Performed at [facility], [address] [place], [place] [address]   CBG monitoring, ED     Status: Abnormal   Collection Time: 10/08/20  5:22 PM  Result Value Ref Range   Glucose-Capillary 222 (H) 70 - 99 mg/dL    Comment: Glucose reference range applies only to samples taken after fasting for at least 8 hours.  Glucose, capillary     Status: Abnormal   Collection Time: 10/08/20  9:53 PM  Result Value Ref Range   Glucose-Capillary 179 (H) 70 - 99 mg/dL    Comment: Glucose reference range applies only to samples taken after fasting for at least 8  hours.  Glucose, capillary     Status: Abnormal   Collection Time: 10/09/20  3:52 AM  Result Value Ref Range   Glucose-Capillary 202 (H) 70 - 99 mg/dL    Comment: Glucose reference range applies only to samples taken after fasting for at least 8 hours.  Renal function panel     Status: Abnormal   Collection Time: 10/09/20  5:58 AM  Result Value Ref Range   Sodium 133 (L) 135 - 145 mmol/L   Potassium 3.7 3.5 - 5.1 mmol/L   Chloride 99 98 - 111 mmol/L   CO2 23 22 - 32 mmol/L   Glucose, Bld 219 (H) 70 - 99 mg/dL    Comment: Glucose reference range applies only to samples taken after fasting for at least 8 hours.   BUN 51 (H) 8 - 23 mg/dL   Creatinine, Ser 5.05 (H) 0.61 - 1.24 mg/dL   Calcium 8.9 8.9 - 10.3 mg/dL   Phosphorus 4.7 (H) 2.5 - 4.6 mg/dL   Albumin 2.8 (L) 3.5 - 5.0 g/dL   GFR, Estimated 11 (L) >60 mL/min    Comment: (NOTE) Calculated using the CKD-EPI Creatinine Equation (2021)    Anion gap 11 5 - 15    Comment: Performed at [facility] [address], [place], [place] [address]  CBC     Status: Abnormal   Collection Time: 10/09/20  5:58 AM  Result Value Ref Range   WBC 8.0 4.0 - 10.5 K/uL   RBC 3.22 (L) 4.22 - 5.81 MIL/uL   Hemoglobin 9.9 (L) 13.0 - 17.0 g/dL   HCT 30.2 (L) 39.0 - 52.0 %   MCV 93.8 80.0 - 100.0 fL   MCH 30.7 26.0 - 34.0 pg   MCHC 32.8 30.0 - 36.0 g/dL   RDW 16.1 (H) 11.5 - 15.5 %   Platelets 224 150 - 400 K/uL   nRBC 0.0 0.0 - 0.2 %    Comment: Performed at [facility], [place]  [address], [place], [place] [address]  Magnesium     Status: Abnormal   Collection Time: 10/09/20  5:58 AM  Result Value Ref Range   Magnesium 1.6 (L) 1.7 - 2.4 mg/dL    Comment: Performed at [facility] [address], [place], [place] [address]  Glucose, capillary     Status: Abnormal   Collection Time: 10/09/20  7:47 AM  Result Value Ref Range   Glucose-Capillary 192 (H) 70 - 99 mg/dL    Comment: Glucose reference range applies only to samples  taken after fasting for at least 8 hours.    US Venous Img Lower Bilateral (DVT)  Result Date: 10/08/2020 CLINICAL DATA:  80 year old male with a history of edema EXAM: BILATERAL LOWER EXTREMITY VENOUS DOPPLER ULTRASOUND TECHNIQUE: Gray-scale sonography with graded compression, as well as color Doppler and duplex ultrasound were performed to evaluate the lower extremity deep venous systems from the level of the common femoral vein and including the common femoral, femoral, profunda femoral, popliteal and calf veins including the posterior tibial, peroneal and gastrocnemius veins when visible. The superficial great saphenous vein was also interrogated. Spectral Doppler was utilized to evaluate flow at rest and with distal augmentation maneuvers in the common femoral, femoral and popliteal veins. COMPARISON:  None. FINDINGS: RIGHT LOWER EXTREMITY Common Femoral Vein: No evidence of thrombus. Normal compressibility, respiratory phasicity and response to augmentation. Saphenofemoral Junction: No evidence of thrombus. Normal compressibility and flow on color Doppler imaging. Profunda Femoral Vein: No evidence of thrombus. Normal compressibility and flow on color Doppler imaging. Femoral Vein: No evidence of thrombus. Normal compressibility, respiratory phasicity and response to augmentation. Popliteal Vein: No evidence of thrombus. Normal compressibility, respiratory phasicity and response to augmentation. Calf Veins: No evidence of thrombus. Normal compressibility and flow on color Doppler imaging. Superficial Great Saphenous Vein: No evidence of thrombus. Normal compressibility and flow on color Doppler imaging. Other Findings:  None. LEFT LOWER EXTREMITY Common Femoral Vein: No evidence of thrombus. Normal compressibility, respiratory phasicity and response to augmentation. Saphenofemoral Junction: No evidence of thrombus. Normal compressibility and flow on color Doppler imaging. Profunda Femoral Vein: No  evidence of thrombus. Normal compressibility and flow on color Doppler imaging. Femoral Vein: No evidence of thrombus. Normal compressibility, respiratory phasicity and response to augmentation. Popliteal Vein: No evidence of thrombus. Normal compressibility, respiratory phasicity and response to augmentation. Calf Veins: No evidence of thrombus. Normal compressibility and flow on color Doppler imaging. Superficial Great Saphenous Vein: No evidence of thrombus. Normal compressibility and flow on color Doppler imaging Other Findings:  None. IMPRESSION: Sonographic survey of the left lower extremity negative for DVT Electronically Signed   By: Corrie Mckusick D.O.   On: 10/08/2020 14:55   DG Chest Port 1 View  Result Date: 10/08/2020 CLINICAL DATA:  Fall. EXAM: PORTABLE CHEST 1 VIEW COMPARISON:  08/13/2020. FINDINGS: Dual-lumen catheter noted with tip over right atrium. Cardiomegaly. No pulmonary venous congestion. Low lung volumes with mild bibasilar atelectasis. No pleural effusion or pneumothorax. No acute bony abnormality. IMPRESSION: 1. Dual-lumen catheter noted with tip over right atrium. No pulmonary venous congestion. 2.  Low lung volumes with mild bibasilar atelectasis. Electronically Signed   By: Marcello Moores  Register   On: 10/08/2020 13:18   DG HIP UNILAT W OR W/O PELVIS 2-3 VIEWS RIGHT  Result Date: 10/08/2020 Clinical:  Fall May 27, hip pain X-rays were done of the right hip and pelvis. There is a displaced femoral neck fracture of the right hip with some foreshortening.  Bone quality is good.  Femoral head well located within acetabulum. Impression:  Displaced femoral neck fracture of the right hip. Electronically Signed Sanjuana Kava, MD 6/28/20229:40 AM    Review of Systems  HENT:  Negative for ear discharge, ear pain, hearing loss and tinnitus.   Eyes:  Negative for photophobia and pain.  Respiratory:  Negative for cough and shortness of breath.   Cardiovascular:  Negative for chest pain.   Gastrointestinal:  Negative for abdominal pain, nausea and vomiting.  Genitourinary:  Negative for dysuria, flank pain, frequency and urgency.  Musculoskeletal:  Positive for arthralgias (Right hip/knee). Negative for back pain, myalgias and neck pain.  Neurological:  Negative for dizziness and headaches.  Hematological:  Does not bruise/bleed easily.  Psychiatric/Behavioral:  The patient is not nervous/anxious.   Blood pressure (!) 159/72, pulse 75, temperature 98.4 F (36.9 C), temperature source Oral, resp. rate 18, height 5\' 11"  (1.803 m), weight 109.8 kg, SpO2 100 %. Physical Exam Constitutional:      General: Trevor [name] is not in acute distress.    Appearance: Trevor [name] is well-developed. Trevor [name] is not diaphoretic.  HENT:     Head: Normocephalic and atraumatic.  Eyes:     General: No scleral icterus.       Right eye: No discharge.        Left eye: No discharge.     Conjunctiva/sclera: Conjunctivae normal.  Cardiovascular:     Rate and Rhythm: Normal rate and regular rhythm.  Pulmonary:     Effort: Pulmonary effort is normal. No respiratory distress.  Musculoskeletal:     Cervical back: Normal range of motion.     Comments: RLE No traumatic wounds, ecchymosis, or rash  Mod TTP hip  No knee or ankle effusion  Knee stable to varus/ valgus and anterior/posterior stress  Sens DPN, SPN, TN intact  Motor EHL, ext, flex, evers 5/5  DP 1+, PT 0, No significant edema  Skin:    General: Skin is warm and dry.  Neurological:     Mental Status: Trevor [name] is alert.  Psychiatric:        Mood and Affect: Mood normal.        Behavior: Behavior normal.    Assessment/Plan: Right hip fx -- Plan THA by Dr. Lyla Glassing today. Please keep NPO. ESRD on HD    Lisette Abu, PA-C Orthopedic Surgery [phone] 10/09/2020, 9:04 AM

## 2020-10-09 NOTE — Discharge Instructions (Signed)
? ?Dr. Vernis Eid ?Joint Replacement Specialist ?[facility] ?[address] ?[place], [place] [address] ?[phone] ? ? ?TOTAL HIP REPLACEMENT POSTOPERATIVE DIRECTIONS ? ? ? ?Hip Rehabilitation, Guidelines Following Surgery  ? ?WEIGHT BEARING ?Weight bearing as tolerated with assist device (walker, cane, etc) as directed, use it as long as suggested by your surgeon or therapist, typically at least 4-6 weeks. ? ?The results of a hip operation are greatly improved after range of motion and muscle strengthening exercises. Follow all safety measures which are given to protect your hip. If any of these exercises cause increased pain or swelling in your joint, decrease the amount until you are comfortable again. Then slowly increase the exercises. Call your caregiver if you have problems or questions.  ? ?HOME CARE INSTRUCTIONS  ?Most of the following instructions are designed to prevent the dislocation of your new hip.  ?Remove items at home which could result in a fall. This includes throw rugs or furniture in walking pathways.  ?Continue medications as instructed at time of discharge. ?You may have some home medications which will be placed on hold until you complete the course of blood thinner medication. ?You may start showering once you are discharged home. Do not remove your dressing. ?Do not put on socks or shoes without following the instructions of your caregivers.   ?Sit on chairs with arms. Use the chair arms to help push yourself up when arising.  ?Arrange for the use of a toilet seat elevator so you are not sitting low.  ?Walk with walker as instructed.  ?You may resume a sexual relationship in one month or when given the OK by your caregiver.  ?Use walker as long as suggested by your caregivers.  ?You may put full weight on your legs and walk as much as is comfortable. ?Avoid periods of inactivity such as sitting longer than an hour when not asleep. This helps prevent blood  clots.  ?You may return to work once you are cleared by your surgeon.  ?Do not drive a car for 6 weeks or until released by your surgeon.  ?Do not drive while taking narcotics.  ?Wear elastic stockings for two weeks following surgery during the day but you may remove then at night.  ?Make sure you keep all of your appointments after your operation with all of your doctors and caregivers. You should call the office at the above phone number and make an appointment for approximately two weeks after the date of your surgery. ?Please pick up a stool softener and laxative for home use as long as you are requiring pain medications. ?ICE to the affected hip every three hours for 30 minutes at a time and then as needed for pain and swelling. Continue to use ice on the hip for pain and swelling from surgery. You may notice swelling that will progress down to the foot and ankle.  This is normal after surgery.  Elevate the leg when you are not up walking on it.   ?It is important for you to complete the blood thinner medication as prescribed by your doctor. ?Continue to use the breathing machine which will help keep your temperature down.  It is common for your temperature to cycle up and down following surgery, especially at night when you are not up moving around and exerting yourself.  The breathing machine keeps your lungs expanded and your temperature down. ? ?RANGE OF MOTION AND STRENGTHENING EXERCISES  ?These exercises are designed to help you   keep full movement of your hip joint. Follow your caregiver's or physical therapist's instructions. Perform all exercises about fifteen times, three times per day or as directed. Exercise both hips, even if you have had only one joint replacement. These exercises can be done on a training (exercise) mat, on the floor, on a table or on a bed. Use whatever works the best and is most comfortable for you. Use music or television while you are exercising so that the exercises are a  pleasant break in your day. This will make your life better with the exercises acting as a break in routine you can look forward to.  ?Lying on your back, slowly slide your foot toward your buttocks, raising your knee up off the floor. Then slowly slide your foot back down until your leg is straight again.  ?Lying on your back spread your legs as far apart as you can without causing discomfort.  ?Lying on your side, raise your upper leg and foot straight up from the floor as far as is comfortable. Slowly lower the leg and repeat.  ?Lying on your back, tighten up the muscle in the front of your thigh (quadriceps muscles). You can do this by keeping your leg straight and trying to raise your heel off the floor. This helps strengthen the largest muscle supporting your knee.  ?Lying on your back, tighten up the muscles of your buttocks both with the legs straight and with the knee bent at a comfortable angle while keeping your heel on the floor.  ? ?SKILLED REHAB INSTRUCTIONS: ?If the patient is transferred to a skilled rehab facility following release from the hospital, a list of the current medications will be sent to the facility for the patient to continue.  When discharged from the skilled rehab facility, please have the facility set up the patient's Home Health Physical Therapy prior to being released. Also, the skilled facility will be responsible for providing the patient with their medications at time of release from the facility to include their pain medication and their blood thinner medication. If the patient is still at the rehab facility at time of the two week follow up appointment, the skilled rehab facility will also need to assist the patient in arranging follow up appointment in our office and any transportation needs. ? ?POST-OPERATIVE OPIOID TAPER INSTRUCTIONS: ?It is important to wean off of your opioid medication as soon as possible. If you do not need pain medication after your surgery it is ok  to stop day one. ?Opioids include: ?Codeine, Hydrocodone(Norco, Vicodin), Oxycodone(Percocet, oxycontin) and hydromorphone amongst others.  ?Long term and even short term use of opiods can cause: ?Increased pain response ?Dependence ?Constipation ?Depression ?Respiratory depression ?And more.  ?Withdrawal symptoms can include ?Flu like symptoms ?Nausea, vomiting ?And more ?Techniques to manage these symptoms ?Hydrate well ?Eat regular healthy meals ?Stay active ?Use relaxation techniques(deep breathing, meditating, yoga) ?Do Not substitute Alcohol to help with tapering ?If you have been on opioids for less than two weeks and do not have pain than it is ok to stop all together.  ?Plan to wean off of opioids ?This plan should start within one week post op of your joint replacement. ?Maintain the same interval or time between taking each dose and first decrease the dose.  ?Cut the total daily intake of opioids by one tablet each day ?Next start to increase the time between doses. ?The last dose that should be eliminated is the evening dose.  ? ? ?MAKE   SURE YOU:  ?Understand these instructions.  ?Will watch your condition.  ?Will get help right away if you are not doing well or get worse. ? ?Pick up stool softner and laxative for home use following surgery while on pain medications. ?Do not remove your dressing. ?The dressing is waterproof--it is OK to take showers. ?Continue to use ice for pain and swelling after surgery. ?Do not use any lotions or creams on the incision until instructed by your surgeon. ?Total Hip Protocol. ? ?

## 2020-10-10 ENCOUNTER — Encounter (HOSPITAL_COMMUNITY): Payer: Self-pay | Admitting: Orthopedic Surgery

## 2020-10-10 DIAGNOSIS — N186 End stage renal disease: Secondary | ICD-10-CM | POA: Diagnosis not present

## 2020-10-10 DIAGNOSIS — G253 Myoclonus: Secondary | ICD-10-CM

## 2020-10-10 DIAGNOSIS — R410 Disorientation, unspecified: Secondary | ICD-10-CM

## 2020-10-10 DIAGNOSIS — Z992 Dependence on renal dialysis: Secondary | ICD-10-CM | POA: Diagnosis not present

## 2020-10-10 DIAGNOSIS — E785 Hyperlipidemia, unspecified: Secondary | ICD-10-CM

## 2020-10-10 DIAGNOSIS — G928 Other toxic encephalopathy: Secondary | ICD-10-CM

## 2020-10-10 LAB — RENAL FUNCTION PANEL
Albumin: 3 g/dL — ABNORMAL LOW (ref 3.5–5.0)
Anion gap: 12 (ref 5–15)
BUN: 54 mg/dL — ABNORMAL HIGH (ref 8–23)
CO2: 21 mmol/L — ABNORMAL LOW (ref 22–32)
Calcium: 8.7 mg/dL — ABNORMAL LOW (ref 8.9–10.3)
Chloride: 101 mmol/L (ref 98–111)
Creatinine, Ser: 5.53 mg/dL — ABNORMAL HIGH (ref 0.61–1.24)
GFR, Estimated: 10 mL/min — ABNORMAL LOW (ref >60)
Glucose, Bld: 213 mg/dL — ABNORMAL HIGH (ref 70–99)
Phosphorus: 5.1 mg/dL — ABNORMAL HIGH (ref 2.5–4.6)
Potassium: 4.5 mmol/L (ref 3.5–5.1)
Sodium: 134 mmol/L — ABNORMAL LOW (ref 135–145)

## 2020-10-10 LAB — BLOOD GAS, ARTERIAL
Acid-base deficit: 0.3 mmol/L (ref 0.0–2.0)
Bicarbonate: 23 mmol/L (ref 20.0–28.0)
FIO2: 21
O2 Saturation: 98 %
Patient temperature: 39.5
pCO2 arterial: 36.6 mmHg (ref 32.0–48.0)
pH, Arterial: 7.428 (ref 7.350–7.450)
pO2, Arterial: 107 mmHg (ref 83.0–108.0)

## 2020-10-10 LAB — GLUCOSE, CAPILLARY
Glucose-Capillary: 191 mg/dL — ABNORMAL HIGH (ref 70–99)
Glucose-Capillary: 190 mg/dL — ABNORMAL HIGH (ref 70–99)
Glucose-Capillary: 137 mg/dL — ABNORMAL HIGH (ref 70–99)
Glucose-Capillary: 205 mg/dL — ABNORMAL HIGH (ref 70–99)
Glucose-Capillary: 229 mg/dL — ABNORMAL HIGH (ref 70–99)

## 2020-10-10 LAB — AMMONIA: Ammonia: 21 umol/L (ref 9–35)

## 2020-10-10 LAB — CBC
HCT: 30.5 % — ABNORMAL LOW (ref 39.0–52.0)
Hemoglobin: 9.7 g/dL — ABNORMAL LOW (ref 13.0–17.0)
MCH: 30.7 pg (ref 26.0–34.0)
MCHC: 31.8 g/dL (ref 30.0–36.0)
MCV: 96.5 fL (ref 80.0–100.0)
Platelets: 196 10*3/uL (ref 150–400)
RBC: 3.16 MIL/uL — ABNORMAL LOW (ref 4.22–5.81)
RDW: 16.1 % — ABNORMAL HIGH (ref 11.5–15.5)
WBC: 11.7 10*3/uL — ABNORMAL HIGH (ref 4.0–10.5)
nRBC: 0 % (ref 0.0–0.2)

## 2020-10-10 LAB — HEMOGLOBIN A1C
Hgb A1c MFr Bld: 6.9 % — ABNORMAL HIGH (ref 4.8–5.6)
Mean Plasma Glucose: 151 mg/dL

## 2020-10-10 MED ORDER — NALOXONE HCL 0.4 MG/ML IJ SOLN
0.4000 mg | INTRAMUSCULAR | Status: DC | PRN
  Administered 2020-10-10: 0.4 mg via INTRAVENOUS
  Filled 2020-10-10: qty 1

## 2020-10-10 MED ORDER — HEPARIN SODIUM (PORCINE) 1000 UNIT/ML IJ SOLN
INTRAMUSCULAR | Status: AC
  Administered 2020-10-10: 3800 [IU] via INTRAVENOUS_CENTRAL
  Filled 2020-10-10: qty 4

## 2020-10-10 MED ORDER — CHLORHEXIDINE GLUCONATE CLOTH 2 % EX PADS
6.0000 | MEDICATED_PAD | Freq: Every day | CUTANEOUS | Status: DC
  Administered 2020-10-10 – 2020-10-17 (×5): 6 via TOPICAL

## 2020-10-10 MED ORDER — HYDROCODONE-ACETAMINOPHEN 5-325 MG PO TABS
ORAL_TABLET | ORAL | Status: AC
  Filled 2020-10-10: qty 2

## 2020-10-10 MED ORDER — HYDRALAZINE HCL 20 MG/ML IJ SOLN
10.0000 mg | Freq: Once | INTRAMUSCULAR | Status: AC
  Administered 2020-10-10: 10 mg via INTRAVENOUS

## 2020-10-10 NOTE — Progress Notes (Signed)
Pt came back from dialysis, BP 211/91, unable to answer some questions and follow command. MD notified and the stroke team came to assess patient. IV hydralazine given x 2 and MRI ordered.

## 2020-10-10 NOTE — Consult Note (Addendum)
Neurology Consultation  Reason for Consult: confusion, ? Code stroke. Referring Physician: O. Sheikh, DO.   CC: patient returned from dialysis confused. Attending was going to call a code stroke.   History is obtained from: chart.   HPI: Trevor [name] is a 80 y.o. male ESRD with HD M, W, F that just started one month ago, HTN, HLD, and DM II. Patient presented to OSH ED 2 days ago with c/o dizziness and simultaneous fall. Symptoms occurred while getting his HD port. Xray in ED showed a displaced femoral neck fracture and patient underwent ORIF 10/09/20 after TF to [facility].   Nephrology was consulted as well with restart of HD here in the hospital. Patient was his normal self before HD and returned confused. BP was over 200 after HD. Hydralazine 10mg  IV was given with little to no lowering of BP. Hydralazine 10mg  IV was repeated about 15 mins after first dose. Because of patient having opioid earlier, Narcan was also given without change in mental status.   Neurology to bedside urgently and after examination, code stroke was cancelled by Dr. Rory Percy. Asterixis noted on exam. Dr. Alfredia Ferguson was also at bedside. MRI brain is ordered.   No personal history or FMHx of stroke on chart. Patient has several stroke risk factors.   Neurology was consulted for possible code stroke, which was cancelled, but will do MRI brain to r/o stroke or other brain abnormalities that may be contributing to his confusion.   ROS: Unable to obtain due to altered mental status.   Past Medical History:  Diagnosis Date   Anemia    Chronic kidney disease    Stage 4?    Diabetes mellitus without complication (HCC)    Hepatitis    not sure what type - over 20 years ago   History of kidney stones    Hyperlipidemia    Hypertension     Family History  Problem Relation Age of Onset   Diabetes Mother    Diabetes Brother    Heart attack Brother    Colon cancer Son    Lung cancer Son    Social History:   reports that he  has never smoked. He has never used smokeless tobacco. He reports that he does not drink alcohol and does not use drugs.  Medications  Current Facility-Administered Medications:    (feeding supplement) PROSource Plus liquid 30 mL, 30 mL, Oral, TID BM, Swinteck, Aaron Edelman, MD   aspirin EC tablet 325 mg, 325 mg, Oral, Q breakfast, Swinteck, Aaron Edelman, MD, 325 mg at 10/10/20 1144   carvedilol (COREG) tablet 6.25 mg, 6.25 mg, Oral, BID WC, Swinteck, Aaron Edelman, MD, 6.25 mg at 10/09/20 0907   chlorhexidine (HIBICLENS) 4 % liquid 4 application, 60 mL, Topical, Once, Swinteck, Aaron Edelman, MD   Chlorhexidine Gluconate Cloth 2 % PADS 6 each, 6 each, Topical, Q0600, Rosita Fire, MD, 6 each at 10/10/20 1145   cholecalciferol (VITAMIN D3) tablet 1,000 Units, 1,000 Units, Oral, Daily, Swinteck, Aaron Edelman, MD, 1,000 Units at 10/10/20 1146   cloNIDine (CATAPRES) tablet 0.2 mg, 0.2 mg, Oral, TID, Swinteck, Aaron Edelman, MD, 0.2 mg at 10/10/20 1031   docusate sodium (COLACE) capsule 100 mg, 100 mg, Oral, BID, Swinteck, Aaron Edelman, MD, 100 mg at 10/10/20 1144   hydrALAZINE (APRESOLINE) injection 10 mg, 10 mg, Intravenous, Q4H PRN, Swinteck, Aaron Edelman, MD, 10 mg at 10/10/20 1206   hydrALAZINE (APRESOLINE) injection 10 mg, 10 mg, Intravenous, Once, Kirby-Graham, Karsten Fells, NP   hydrALAZINE (APRESOLINE) tablet 50 mg, 50  mg, Oral, BID, Swinteck, Aaron Edelman, MD, 50 mg at 10/10/20 1030   HYDROcodone-acetaminophen (NORCO/VICODIN) 5-325 MG per tablet 1-2 tablet, 1-2 tablet, Oral, Q6H PRN, Rod Can, MD, 2 tablet at 10/10/20 1037   HYDROcodone-acetaminophen (NORCO/VICODIN) 5-325 MG per tablet, , , ,    HYDROmorphone (DILAUDID) injection 0.5 mg, 0.5 mg, Intravenous, Q3H PRN, Rod Can, MD, 0.5 mg at 10/09/20 2217   insulin aspart (novoLOG) injection 0-6 Units, 0-6 Units, Subcutaneous, TID WC, Swinteck, Aaron Edelman, MD, 2 Units at 10/08/20 1736   insulin detemir (LEVEMIR) injection 8 Units, 8 Units, Subcutaneous, QHS, Swinteck, Aaron Edelman, MD, 8 Units at  10/09/20 2213   isoniazid (NYDRAZID) tablet 300 mg, 300 mg, Oral, Daily, Swinteck, Aaron Edelman, MD, 300 mg at 10/10/20 1146   menthol-cetylpyridinium (CEPACOL) lozenge 3 mg, 1 lozenge, Oral, PRN **OR** phenol (CHLORASEPTIC) mouth spray 1 spray, 1 spray, Mouth/Throat, PRN, Swinteck, Aaron Edelman, MD   metoCLOPramide (REGLAN) tablet 5 mg, 5 mg, Oral, Q8H PRN **OR** metoCLOPramide (REGLAN) injection 5 mg, 5 mg, Intravenous, Q8H PRN, Rod Can, MD   multivitamin (RENA-VIT) tablet 1 tablet, 1 tablet, Oral, QHS, Swinteck, Aaron Edelman, MD   naloxone [facility]) injection 0.4 mg, 0.4 mg, Intravenous, PRN, Alfredia Ferguson, Omair Latif, DO, 0.4 mg at 10/10/20 1212   ondansetron (ZOFRAN) tablet 4 mg, 4 mg, Oral, Q6H PRN **OR** ondansetron (ZOFRAN) injection 4 mg, 4 mg, Intravenous, Q6H PRN, Rod Can, MD, 4 mg at 10/09/20 2239   polyethylene glycol (MIRALAX / GLYCOLAX) packet 17 g, 17 g, Oral, Daily PRN, Rod Can, MD   povidone-iodine 10 % swab 2 application, 2 application, Topical, Once, Swinteck, Aaron Edelman, MD   povidone-iodine 10 % swab 2 application, 2 application, Topical, Once, Swinteck, Aaron Edelman, MD   pyridOXINE (VITAMIN B-6) tablet 50 mg, 50 mg, Oral, Daily, Swinteck, Aaron Edelman, MD, 50 mg at 10/10/20 1145   rosuvastatin (CRESTOR) tablet 10 mg, 10 mg, Oral, Daily, Swinteck, Aaron Edelman, MD, 10 mg at 10/10/20 1144  Exam: Current vital signs: BP (!) 197/71 (BP Location: Right Arm)   Pulse (!) 107   Temp 99 F (37.2 C) (Oral)   Resp 20   Ht 5\' 11"  (1.803 m)   Wt 97 kg   SpO2 100%   BMI 29.83 kg/m  Vital signs in last 24 hours: Temp:  [97.9 F (36.6 C)-99.6 F (37.6 C)] 99 F (37.2 C) (06/30 1224) Pulse Rate:  [58-107] 107 (06/30 1224) Resp:  [14-20] 20 (06/30 1224) BP: (125-211)/(40-91) 197/71 (06/30 1224) SpO2:  [96 %-100 %] 100 % (06/30 1224) Weight:  [97 kg-98.3 kg] 97 kg (06/30 1123)  PE: GENERAL: Well appearing lying in bed, confused. Awake, alert in NAD. HEENT: - Normocephalic and atraumatic. LUNGS - Normal  respiratory effort.  CV - RRR on tele. ABDOMEN - Soft, nontender. Ext: warm, well perfused. Psych: affect with frustration. Uncooperative at times but likely due to confusion.   NEURO:  Mental Status: Alert and oriented to his name and place.  Disoriented to city, state, month, year, day and date. He perseverates on previous questions. Follows simple commands but confuses tasks.  Speech/Language: speech is without dysarthria or aphasia.  Naming of pen and watch intact. Knows purpose of both items. Repetition, fluency, and comprehension intact but slowed.  Cranial Nerves:  II: PERRL 2 mm/brisk. visual fields full.  III, IV, VI: EOMI. Lid elevation symmetric and full.  V: sensation is intact and symmetrical to face.  VII: Smile is symmetrical.  VIII:hearing intact to voice. IX, X: palate elevation is symmetric. Phonation normal.  XI:  normal sternocleidomastoid and trapezius muscle strength. GYJ:EHUDJS is symmetrical without fasciculations.   Motor:  RUE: grips  5/5  triceps 5/5  biceps  5/5      LUE: grips  5/5  triceps  5/5    biceps   5/5 RLE:  s/p ORIF femur.  LLE:  knee  5/5   thigh  5/5   plantar flexion   5/5  dorsiflexion   5/5 Tone is normal. Bulk is increased.  Sensation- Intact to light touch bilaterally in all four extremities.  Coordination: Asterixis noted in UEs, Left > right. Will not leave arms or legs up long enough to test for drift.   Gait- deferred.  Labs I have reviewed labs in epic and the results pertinent to this consultation are: Creatinine 5.53 prior to HD. WBCC up to 11.7 from 7.9.  CBC    Component Value Date/Time   WBC 11.7 (H) 10/10/2020 0324   RBC 3.16 (L) 10/10/2020 0324   HGB 9.7 (L) 10/10/2020 0324   HCT 30.5 (L) 10/10/2020 0324   PLT 196 10/10/2020 0324   MCV 96.5 10/10/2020 0324   MCH 30.7 10/10/2020 0324   MCHC 31.8 10/10/2020 0324   RDW 16.1 (H) 10/10/2020 0324   LYMPHSABS 0.8 10/09/2020 1011   MONOABS 0.6 10/09/2020 1011   EOSABS  0.1 10/09/2020 1011   BASOSABS 0.0 10/09/2020 1011    CMP     Component Value Date/Time   NA 134 (L) 10/10/2020 0324   K 4.5 10/10/2020 0324   CL 101 10/10/2020 0324   CO2 21 (L) 10/10/2020 0324   GLUCOSE 213 (H) 10/10/2020 0324   BUN 54 (H) 10/10/2020 0324   BUN 60 (A) 04/30/2020 0000   CREATININE 5.53 (H) 10/10/2020 0324   CREATININE 7.30 (H) 08/27/2020 0000   CALCIUM 8.7 (L) 10/10/2020 0324   PROT 6.0 (L) 08/27/2020 0000   ALBUMIN 3.0 (L) 10/10/2020 0324   AST 13 08/27/2020 0000   ALT 14 08/27/2020 0000   ALKPHOS 39 08/13/2020 1648   BILITOT 0.3 08/27/2020 0000   GFRNONAA 10 (L) 10/10/2020 0324   GFRAA 12 04/30/2020 0000    Imaging MRI brain ordered.   Assessment: 80 yo male admitted after dizziness and a fall with resultant right femoral fracture. POD #1. Today, prior to HD, RN states patient was his normal self, not confused. When patient returned from HD he was confused and his BP was over 200. Code stroke not called after exam due to low suspicion for acute stroke. His acute change in mental status is likely due to his high BP. Also, contributing is s/p anesthesia and s/p HD. His WBCC is up which may be inflammatory reaction, but he should be worked up for infectious causes.   Impression: -Acute change in mental status.  -Uncontrolled HTN.  -metabolic derangements.  -Leukocytosis.  -asterixis.   Recommendations/Plan:  -MRI brain without contrast.  -Lower BP with goal normotensive.  -Chemistry labs to check creatinine post HD and LFTs.  -If patient has a history of ETOH, start Thiamine high dose then 100mg  po qd.  -Check Vitamin B12 level and supplement if < 500.   -Continue to correct metabolic derangements as you are doing.  -Agree with ammonia due to asterixis. -Agree with ABG.  -Work up for leukocytosis unless believed to be inflammatory reaction.    Pt seen by Clance Boll, NP/Neuro and by MD. Note/plan to be edited by MD as needed.  Pager:  [phone]  Attending Neurohospitalist Addendum Patient  seen and examined with APP/Resident. Agree with the history and physical as documented above. Agree with the plan as documented, which I helped formulate. I have independently reviewed the chart, obtained history, review of systems and examined the patient.I have personally reviewed pertinent head/neck/spine imaging (CT/MRI). Examination suggestive of reduced attention concentration as well as asterixis and polymyoclonus-toxic metabolic versus hypertensive encephalopathy. No need for stat CT-get a routine MRI. CTH from 04/2020 - no acute abnormality. Rest of the recommendations above. Please feel free to call with any questions.  -- Amie Portland, MD Neurologist [facility] Pager: [phone]

## 2020-10-10 NOTE — Progress Notes (Signed)
PT Cancellation Note  Patient Details Name: Trevor [name] MRN: [id number] DOB: November 28, 1940   Cancelled Treatment:    Reason Eval/Treat Not Completed: Other (comment).  Pt is unable to be seen, in HD, will retry as time and pt allow.   Ramond Dial 10/10/2020, 9:10 AM  Mee Hives, PT MS Acute Rehab Dept. Number: [place] [facility] [place]

## 2020-10-10 NOTE — Progress Notes (Signed)
PROGRESS NOTE    Trevor [name]  SWF:[id number] DOB: Dec 20, 1940 DOA: 10/08/2020 PCP: Celene Squibb, MD   Brief Narrative:  The patient is a 80 year old obese African-American male with a past medical history significant for but not limited to end-stage renal disease on hemodialysis Monday Wednesday Friday, controlled diabetes mellitus type 2 as well as other comorbidities who presented to ED with hip pain and inability ambulate for about a month.  He went to the [facility] to get hemodialysis port replacement for 12 dizzy and fell.  At the [facility] ED he received a knee x-ray which revealed no injury.  After port replacement discharge patient to experience severe right hip pain rendering him unable to walk for about a month.  He saw Dr. Patrick Jupiter Of orthopedic surgery who ordered a hip x-ray which revealed a displaced femoral neck fracture.  Patient was needing surgery and transferred to [facility] for complex anesthesia with dialysis and orthopedic surgery and nephrology was consulted.  Patient is currently going to go for a right hip total arthroplasty from anterior approach and this was done 10/09/20.  Today he went for dialysis and tolerated it well but when he came back he was significantly confused. He was given Narcotics in dialysis. Narcan was given and did not change the patient's Mental Status. Neurology consulted and recommending MRI given his asterixis and poly myoclonus. Ammonia and ABG were also ordered. BP was elevated so was given Hydralazine   Assessment & Plan:   Principal Problem:   Hip fracture (HCC) Active Problems:   Diabetes mellitus with stage 5 chronic kidney disease (HCC)   Anemia   Gastroesophageal reflux disease   Hyperlipidemia   Neuropathy   End stage renal disease (HCC)   Hyponatremia   Hyperglycemia   Hip pain  Right Closed Displaced Femoral Neck Fracture Acute Hip Pain -Confirmed on imaging on 10/08/20 as an outpatient  -Orthopedic Surgery was consulted and patient  to go for Right Total Hip Arthroplasty Anterior Approch  -VTE Prophylaxis per Orthopedic Surgery  -WB Status per Ortho  -PT/OT to evaluate and Treat  -Pain Control with Hydrocodone-Acetaminophen 1-2 tab\ q6hprn Severe Pain, and IV Hydromorphone 0.5 mg q3hprn Severe Pain but may need to be more Judicious given Confusion and Reduced Concentration and Twitching  -Bowel Regimen Post-Op but has Miralaz 17 g po Daily PRN   DM  in setting of ESRD  -stable, last A1c was 6.2 -POC glucose was in 300s yesterday -SSI Novolog 0-6 units TID with meals - Insulin detemir 8 units qhs -Continue to Monitor CBG's per Protocol    ESRD on HD MWF Metabolic Acidosis -Creatinine 6-7 at baseline.  -Patient's BUN/Cr went from 44/4.44 -> 51/5.05 -> 54/5.53 -Patient had a Metabolic Acidosis with a CO2 of 21, AG 12, and a Chloride of 101 -Continiue HDS as scheduled (M, W, F) -Vit D3 1000 units -Nephrology evaluated and he underwen Dialysis today after Surgery and holding Heparin today and minimizing Ultrafiltration; Pateint to go undergo Dialysis today   Hypertension  -Patient took home meds this AM (coreg, clonidine, statin). BP systolic 220U in ED in setting of severe pain. Improved with hydralazine 10mg  IV -C/w Carvedilol 6.25 mg bid PO -C/w Hydralazine 50 mg bid PO -C/w Clonidine 0.2 mg TID -C/w PRN Hydralazine   Confusion/Encephalopathy and Polymyoclonus -Likely Medication induced -Patient could not follow commands and would not communicate with the Nurse after Dialysis -Spoke with Neurology who recommended calling a CODE Stroke initially but then cancelled -Given  Narcan and obtained ABG -ABG    Component Value Date/Time   PHART 7.428 10/10/2020 1250   PCO2ART 36.6 10/10/2020 1250   PO2ART 107 10/10/2020 1250   HCO3 23.0 10/10/2020 1250   TCO2 23 11/30/2019 0619   ACIDBASEDEF 0.3 10/10/2020 1250   O2SAT 98.0 10/10/2020 1250  -Was going to get a Stat Head CT but Neuro recommended changing to MRI  of Brain -Ammonia Level was 21  HLD -Rosuvastatin 10 mg   Hyponatremia  -In setting of ESRD and volume overload -Na+ 132 on Admission and today was 134 -Continue hemodialysis as scheduled  HLD -C/w Rosuvastatin 10 mg po Daily   Anemia of chronic disease  -Baseline Hgb ~8.5-9. Admission was 9.9 -Hgb/Hct went from 9.9/30.2 -> 10.0/31.0 -> 9.7/30.5 -Expect to Drop Post Operatively -Check Anemia Panel in the AM  -Continue HDS as scheduled   Latent TB -C/w Isoniazide 300 mg po Daily and Pyridoxine 50 mg   Obesity -Complicates overall Prognosis and Care -Estimated body mass index is 29.83 kg/m as calculated from the following:   Height as of this encounter: 5\' 11"  (1.803 m).   Weight as of this encounter: 97 kg. -Weight Loss and Dietary Counseling given -Dietary evaluated and recommending 30 mL of Prosource plus 3 times daily as well as renal multivitamin daily  Leukocytosis -Likely in the setting of Surgical Intervention -Patient's WBC went from 7.9 -> 11.7 -Continue to Monitor and Trend   DVT prophylaxis: Heparin 5,000 units sq q8h Code Status: FULL CODE  Family Communication: Discussed with Wife  Disposition Plan: Pending further clearance by Ortho and Nephrology and Evaluation by PT/OT   Status is: Inpatient  Remains inpatient appropriate because:Unsafe d/c plan, IV treatments appropriate due to intensity of illness or inability to take PO, and Inpatient level of care appropriate due to severity of illness  Dispo: The patient is from: Home              Anticipated d/c is to: SNF              Patient currently is not medically stable to d/c.   Difficult to place patient No  Consultants:  Orthopedic Surgery Nephrology   Procedures: Right Total Hip Arthroplasty Anterior Approach    Antimicrobials:  Anti-infectives (From admission, onward)    Start     Dose/Rate Route Frequency Ordered Stop   10/10/20 0600  ceFAZolin (ANCEF) IVPB 2g/100 mL premix  Status:   Discontinued        2 g 200 mL/hr over 30 Minutes Intravenous On call to O.R. 10/09/20 2106 10/09/20 2130   10/09/20 2200  ceFAZolin (ANCEF) IVPB 2g/100 mL premix  Status:  Discontinued        2 g 200 mL/hr over 30 Minutes Intravenous Every 6 hours 10/09/20 2109 10/10/20 0036   10/09/20 1000  isoniazid (NYDRAZID) tablet 300 mg        300 mg Oral Daily 10/08/20 1629     10/09/20 0830  ceFAZolin (ANCEF) IVPB 2g/100 mL premix        2 g 200 mL/hr over 30 Minutes Intravenous On call to O.R. 10/09/20 7858 10/09/20 1502        Subjective: Seen and examined and he was little more confused after dialysis.  Wife is at bedside.  Patient would answer questions but had lack of concentration did have some twitching of his extremities.  He was very frustrated.  No nausea or vomiting.  Blood pressure was elevated.  No other  concerns or compaints at this time.  Objective: Vitals:   10/10/20 1224 10/10/20 1232 10/10/20 1605 10/10/20 1735  BP: (!) 197/71 (!) 174/69 (!) 190/71 (!) 164/72  Pulse: (!) 107 (!) 103 100 93  Resp: 20 20 20 20   Temp: 99 F (37.2 C)  99.3 F (37.4 C)   TempSrc: Oral Oral Oral Oral  SpO2: 100%  100%   Weight:      Height:        Intake/Output Summary (Last 24 hours) at 10/10/2020 1837 Last data filed at 10/10/2020 1313 Gross per 24 hour  Intake --  Output 1325 ml  Net -1325 ml    Filed Weights   10/08/20 2300 10/10/20 0740 10/10/20 1123  Weight: 109.8 kg 98.3 kg 97 kg   Examination: Physical Exam:  Constitutional: WN/WD overweight AAM in NAD and appears calm and comfortable Eyes: Lids and conjunctivae normal, sclerae anicteric  ENMT: External Ears, Nose appear normal. Grossly normal hearing.  Neck: Appears normal, supple, no cervical masses, normal ROM, no appreciable thyromegaly; no JVD Respiratory: Diminished to auscultation bilaterally, no wheezing, rales, rhonchi or crackles. Normal respiratory effort and patient is not tachypenic. No accessory muscle use.  Unlabored breathing  Cardiovascular: RRR, no murmurs / rubs / gallops. S1 and S2 auscultated. Mild Edema  Abdomen: Soft, non-tender, Distended 2/2 body habitus. Bowel sounds positive.  GU: Deferred. Musculoskeletal: No clubbing / cyanosis of digits/nails. No joint deformity upper and lower extremities. Diminished strength in RLE   Skin: No rashes, lesions, ulcers on a limited skin evaluation. No induration; Warm and dry.  Neurologic: Slower to respond to questions. Has some jerking of extremities  Psychiatric: Impaired judgment and insight. Alert and oriented x 2. Frustrated and agitated mood and appropriate affect.   Data Reviewed: I have personally reviewed following labs and imaging studies  CBC: Recent Labs  Lab 10/08/20 1224 10/09/20 0558 10/09/20 1011 10/10/20 0324  WBC 7.9 8.0 7.9 11.7*  NEUTROABS 6.3  --  6.3  --   HGB 9.9* 9.9* 10.0* 9.7*  HCT 31.4* 30.2* 31.0* 30.5*  MCV 97.5 93.8 94.5 96.5  PLT 231 224 247 607    Basic Metabolic Panel: Recent Labs  Lab 10/08/20 1224 10/09/20 0558 10/09/20 1011 10/10/20 0324  NA 132* 133*  --  134*  K 3.7 3.7  --  4.5  CL 97* 99  --  101  CO2 27 23  --  21*  GLUCOSE 318* 219*  --  213*  BUN 44* 51*  --  54*  CREATININE 4.44* 5.05*  --  5.53*  CALCIUM 8.5* 8.9  --  8.7*  MG  --  1.6* 1.8  --   PHOS  --  4.7* 4.4 5.1*    GFR: Estimated Creatinine Clearance: 12.9 mL/min (A) (by C-G formula based on SCr of 5.53 mg/dL (H)). Liver Function Tests: Recent Labs  Lab 10/09/20 0558 10/10/20 0324  ALBUMIN 2.8* 3.0*    No results for input(s): LIPASE, AMYLASE in the last 168 hours. Recent Labs  Lab 10/10/20 1236  AMMONIA 21   Coagulation Profile: Recent Labs  Lab 10/08/20 1224  INR 1.1    Cardiac Enzymes: No results for input(s): CKTOTAL, CKMB, CKMBINDEX, TROPONINI in the last 168 hours. BNP (last 3 results) No results for input(s): PROBNP in the last 8760 hours. HbA1C: Recent Labs    10/08/20 1115  HGBA1C 6.9*    CBG: Recent Labs  Lab 10/09/20 2254 10/10/20 0225 10/10/20 0650 10/10/20 1153 10/10/20 1603  GLUCAP 215* 191* 190* 137* 205*    Lipid Profile: No results for input(s): CHOL, HDL, LDLCALC, TRIG, CHOLHDL, LDLDIRECT in the last 72 hours. Thyroid Function Tests: No results for input(s): TSH, T4TOTAL, FREET4, T3FREE, THYROIDAB in the last 72 hours. Anemia Panel: No results for input(s): VITAMINB12, FOLATE, FERRITIN, TIBC, IRON, RETICCTPCT in the last 72 hours. Sepsis Labs: No results for input(s): PROCALCITON, LATICACIDVEN in the last 168 hours.  Recent Results (from the past 240 hour(s))  Resp Panel by RT-PCR (Flu A&B, Covid) Nasopharyngeal Swab     Status: None   Collection Time: 10/08/20  1:44 PM   Specimen: Nasopharyngeal Swab; Nasopharyngeal(NP) swabs in vial transport medium  Result Value Ref Range Status   SARS Coronavirus 2 by RT PCR NEGATIVE NEGATIVE Final    Comment: (NOTE) SARS-CoV-2 target nucleic acids are NOT DETECTED.  The SARS-CoV-2 RNA is generally detectable in upper respiratory specimens during the acute phase of infection. The lowest concentration of SARS-CoV-2 viral copies this assay can detect is 138 copies/mL. A negative result does not preclude SARS-Cov-2 infection and should not be used as the sole basis for treatment or other patient management decisions. A negative result may occur with  improper specimen collection/handling, submission of specimen other than nasopharyngeal swab, presence of viral mutation(s) within the areas targeted by this assay, and inadequate number of viral copies(<138 copies/mL). A negative result must be combined with clinical observations, patient history, and epidemiological information. The expected result is Negative.  Fact Sheet for Patients:  EntrepreneurPulse.com.au  Fact Sheet for Healthcare Providers:  IncredibleEmployment.be  This test is no t yet approved or cleared by the  [place] FDA and  has been authorized for detection and/or diagnosis of SARS-CoV-2 by FDA under an Emergency Use Authorization (EUA). This EUA will remain  in effect (meaning this test can be used) for the duration of the COVID-19 declaration under Section 564(b)(1) of the Act, 21 U.S.C.section 360bbb-3(b)(1), unless the authorization is terminated  or revoked sooner.       Influenza A by PCR NEGATIVE NEGATIVE Final   Influenza B by PCR NEGATIVE NEGATIVE Final    Comment: (NOTE) The Xpert Xpress SARS-CoV-2/FLU/RSV plus assay is intended as an aid in the diagnosis of influenza from Nasopharyngeal swab specimens and should not be used as a sole basis for treatment. Nasal washings and aspirates are unacceptable for Xpert Xpress SARS-CoV-2/FLU/RSV testing.  Fact Sheet for Patients: EntrepreneurPulse.com.au  Fact Sheet for Healthcare Providers: IncredibleEmployment.be  This test is not yet approved or cleared by the [place] FDA and has been authorized for detection and/or diagnosis of SARS-CoV-2 by FDA under an Emergency Use Authorization (EUA). This EUA will remain in effect (meaning this test can be used) for the duration of the COVID-19 declaration under Section 564(b)(1) of the Act, 21 U.S.C. section 360bbb-3(b)(1), unless the authorization is terminated or revoked.  Performed at [facility], [address] [place], [place] [address]   Surgical pcr screen     Status: None   Collection Time: 10/09/20  3:58 AM   Specimen: Nasal Mucosa; Nasal Swab  Result Value Ref Range Status   MRSA, PCR NEGATIVE NEGATIVE Final   Staphylococcus aureus NEGATIVE NEGATIVE Final    Comment: (NOTE) The Xpert SA Assay (FDA approved for NASAL specimens in patients 18 years of age and older), is one component of a comprehensive surveillance program. It is not intended to diagnose infection nor to guide or monitor treatment. Performed at [facility],  [address], [place], [place] [address]       RN Pressure Injury Documentation:     Estimated body mass index is 29.83 kg/m as calculated from the following:   Height as of this encounter: 5\' 11"  (1.803 m).   Weight as of this encounter: 97 kg.  Malnutrition Type:  Malnutrition Characteristics: Signs/Symptoms: estimated needs Nutrition Interventions: Interventions: MVI, Prostat   Radiology Studies: Pelvis Portable  Result Date: 10/09/2020 CLINICAL DATA:  Postop EXAM: PORTABLE PELVIS 1-2 VIEWS COMPARISON:  10/09/2020, 10/08/2020 FINDINGS: Interval right hip replacement with intact hardware and normal alignment. Gas in the soft tissues consistent with recent surgery. IMPRESSION: Interval right hip replacement with expected postsurgical changes Electronically Signed   By: Donavan Foil M.D.   On: 10/09/2020 19:53   DG C-Arm 1-60 Min  Result Date: 10/09/2020 CLINICAL DATA:  Total hip arthroplasty. EXAM: DG C-ARM 1-60 MIN; OPERATIVE RIGHT HIP WITH PELVIS FLUOROSCOPY TIME:  Fluoroscopy Time:  11 seconds. Radiation Exposure Index (if provided by the fluoroscopic device): 1.31 mGy. Number of Acquired Spot Images: 7 COMPARISON:  October 08, 2020. FINDINGS: Seven C-arm fluoroscopic images were obtained intraoperatively and submitted for post operative interpretation. These images demonstrate sequential changes associated with right total hip arthroplasty. No unexpected findings. No unexpected radiopaque foreign bodies. Please see the performing provider's procedural report for further detail. IMPRESSION: Right total hip arthroplasty. Electronically Signed   By: Margaretha Sheffield MD   On: 10/09/2020 17:40   DG HIP OPERATIVE UNILAT WITH PELVIS RIGHT  Result Date: 10/09/2020 CLINICAL DATA:  Total hip arthroplasty. EXAM: DG C-ARM 1-60 MIN; OPERATIVE RIGHT HIP WITH PELVIS FLUOROSCOPY TIME:  Fluoroscopy Time:  11 seconds. Radiation Exposure Index (if provided by the fluoroscopic device):  1.31 mGy. Number of Acquired Spot Images: 7 COMPARISON:  October 08, 2020. FINDINGS: Seven C-arm fluoroscopic images were obtained intraoperatively and submitted for post operative interpretation. These images demonstrate sequential changes associated with right total hip arthroplasty. No unexpected findings. No unexpected radiopaque foreign bodies. Please see the performing provider's procedural report for further detail. IMPRESSION: Right total hip arthroplasty. Electronically Signed   By: Margaretha Sheffield MD   On: 10/09/2020 17:40    Scheduled Meds:  (feeding supplement) PROSource Plus  30 mL Oral TID BM   aspirin EC  325 mg Oral Q breakfast   carvedilol  6.25 mg Oral BID WC   chlorhexidine  60 mL Topical Once   Chlorhexidine Gluconate Cloth  6 each Topical Q0600   cholecalciferol  1,000 Units Oral Daily   cloNIDine  0.2 mg Oral TID   docusate sodium  100 mg Oral BID   hydrALAZINE  50 mg Oral BID   HYDROcodone-acetaminophen       insulin aspart  0-6 Units Subcutaneous TID WC   insulin detemir  8 Units Subcutaneous QHS   isoniazid  300 mg Oral Daily   multivitamin  1 tablet Oral QHS   povidone-iodine  2 application Topical Once   povidone-iodine  2 application Topical Once   pyridOXINE  50 mg Oral Daily   rosuvastatin  10 mg Oral Daily   Continuous Infusions:    LOS: 2 days   Kerney Elbe, DO [facility] PAGER is on AMION  If 7PM-7AM, please contact night-coverage [email or web address]

## 2020-10-10 NOTE — Evaluation (Addendum)
Physical Therapy Evaluation Patient Details Name: Trevor [name] MRN: [id number] DOB: Jul 21, 1940 Today's Date: 10/10/2020   History of Present Illness  80 yo male sustained a fall coming to hosp for placement of HD catheter, sustained a fall on 09/07/20.  Did not get imaging on his hip until later, found R femoral neck fracture.  Now has R THA with direct anterior approach from 6/29, referred to PT.  PMHx:  ESRD on HD, anemia, CKD, DM, HLD, HTN, kidney stones  Clinical Impression  Pt was seen to initiate movement after being in HD this AM, with pt demonstrating somewhat lethargic appearance.  He is attended by wife, who reports a six month history of pt being unable to walk.  He has been assisted for a length of time to get into bed, and now is more dependent than previously.  Follow along to promote better active control of balance, transition to sit and LE strength, to facilitate transfers and standing balance control.  Pt will need to get follow up rehab for the recovery of his movement and safety, to increase best effort to get home with wife.    Follow Up Recommendations Follow surgeon's recommendation for DC plan and follow-up therapies;SNF    Equipment Recommendations  Rolling walker with 5" wheels    Recommendations for Other Services       Precautions / Restrictions Precautions Precautions: Fall Restrictions Weight Bearing Restrictions: Yes RLE Weight Bearing: Weight bearing as tolerated      Mobility  Bed Mobility Overal bed mobility: Needs Assistance Bed Mobility: Rolling;Sidelying to Sit;Sit to Sidelying Rolling: Mod assist Sidelying to sit: Mod assist;Max assist     Sit to sidelying: Mod assist;Max assist General bed mobility comments: mod to lift legs and max for moving trunk    Transfers Overall transfer level: Needs assistance Equipment used: 1 person hand held assist;Rolling walker (2 wheeled) Transfers: Sit to/from Stand;Lateral/Scoot Transfers Sit to  Stand: Total assist        Lateral/Scoot Transfers: Mod assist General transfer comment: cannot stand up and slides with mod assist using bed pad  Ambulation/Gait             General Gait Details: not able to stand  Stairs            Wheelchair Mobility    Modified Rankin (Stroke Patients Only)       Balance Overall balance assessment: Needs assistance;History of Falls Sitting-balance support: Feet supported Sitting balance-Leahy Scale: Fair         Standing balance comment: unable to stand                             Pertinent Vitals/Pain Pain Assessment: Faces Faces Pain Scale: Hurts little more Pain Location: R hip Pain Descriptors / Indicators: Operative site guarding;Guarding;Grimacing Pain Intervention(s): Limited activity within patient's tolerance;Monitored during session;Premedicated before session;Repositioned    Home Living Family/patient expects to be discharged to:: Private residence Living Arrangements: Spouse/significant other Available Help at Discharge: Family;Available 24 hours/day Type of Home: House Home Access: Level entry     Home Layout: One level Home Equipment: Cane - single point;Wheelchair - manual Additional Comments: has not walked in six months    Prior Function Level of Independence: Needs assistance   Gait / Transfers Assistance Needed: wheelchair to bed transfers  ADL's / [place] Needed: has been getting assisted by wife        Hand Dominance  Dominant Hand: Right    Extremity/Trunk Assessment   Upper Extremity Assessment Upper Extremity Assessment: Generalized weakness    Lower Extremity Assessment Lower Extremity Assessment: Generalized weakness;RLE deficits/detail RLE Deficits / Details: hip is weak, but pt required help to get legs onto bed per wife RLE: Unable to fully assess due to pain RLE Coordination: decreased gross motor    Cervical / Trunk Assessment Cervical  / Trunk Assessment: Kyphotic (mild)  Communication   Communication: No difficulties  Cognition Arousal/Alertness: Awake/alert;Lethargic Behavior During Therapy: Flat affect Overall Cognitive Status: Difficult to assess                                 General Comments: pt is slow to respond but wife is elderly and not reacting to his presentation      General Comments General comments (skin integrity, edema, etc.): Pt was assisted to sit up and can assist to scoot over, but pt is accustomed to having help with all mobility.  Does not initiate unless both verbal and tactile cues are repeated    Exercises     Assessment/Plan    PT Assessment Patient needs continued PT services  PT Problem List Decreased strength;Decreased range of motion;Decreased activity tolerance;Decreased balance;Decreased mobility;Decreased coordination;Decreased cognition;Decreased knowledge of use of DME;Decreased safety awareness;Decreased skin integrity;Pain       PT Treatment Interventions DME instruction;Functional mobility training;Therapeutic activities;Therapeutic exercise;Balance training;Neuromuscular re-education;Patient/family education    PT Goals (Current goals can be found in the Care Plan section)  Acute Rehab PT Goals Patient Stated Goal: None stated PT Goal Formulation: With family Time For Goal Achievement: 10/24/20 Potential to Achieve Goals: Fair    Frequency 7x per week   Barriers to discharge Decreased caregiver support home with wife who is not able to lift him    Co-evaluation               AM-PAC PT "6 Clicks" Mobility  Outcome Measure Help needed turning from your back to your side while in a flat bed without using bedrails?: A Lot Help needed moving from lying on your back to sitting on the side of a flat bed without using bedrails?: A Lot Help needed moving to and from a bed to a chair (including a wheelchair)?: A Lot Help needed standing up from a  chair using your arms (e.g., wheelchair or bedside chair)?: Total Help needed to walk in hospital room?: Total Help needed climbing 3-5 steps with a railing? : Total 6 Click Score: 9    End of Session Equipment Utilized During Treatment: Gait belt Activity Tolerance: Patient limited by fatigue;Patient limited by pain Patient left: in bed;with call bell/phone within reach;with bed alarm set;with family/visitor present Nurse Communication: Mobility status PT Visit Diagnosis: Unsteadiness on feet (R26.81);Muscle weakness (generalized) (M62.81);Pain;Difficulty in walking, not elsewhere classified (R26.2);Repeated falls (R29.6);History of falling (Z91.81) Pain - Right/Left: Right Pain - part of body: Hip    Time: 7846-9629 PT Time Calculation (min) (ACUTE ONLY): 31 min   Charges:   PT Evaluation $PT Eval Moderate Complexity: 1 Mod PT Treatments $Therapeutic Activity: 8-22 mins       Ramond Dial 10/10/2020, 3:23 PM Mee Hives, PT MS Acute Rehab Dept. Number: [place] and [place]

## 2020-10-10 NOTE — NC FL2 (Signed)
[place] MEDICAID FL2 LEVEL OF CARE SCREENING TOOL     IDENTIFICATION  Patient Name: Trevor [name] Birthdate: [birth year] Sex: male Admission Date (Current Location): 10/08/2020  [facility] and [place] Number:  Herbalist and Address:  [facility], [place] [address], [place], [place] [address]      Provider Number: [id number]  Attending Physician Name and Address:  Kerney Elbe, DO  Relative Name and Phone Number:  Haris, Baack [phone]    Current Level of Care: Hospital Recommended Level of Care: [place] Prior Approval Number:    Date Approved/Denied:   PASRR Number: [id number]  Discharge Plan: SNF    Current Diagnoses: Patient Active Problem List   Diagnosis Date Noted   Hip fracture ([place]) 10/08/2020   End stage renal disease ([place]) 10/08/2020   Hyponatremia 10/08/2020   Hyperglycemia 10/08/2020   Hip pain 10/08/2020   History of renal dialysis 09/13/2020   Anemia 09/13/2020   Edema 09/13/2020   Essential hypertension 09/13/2020   Gastroesophageal reflux disease 09/13/2020   Hyperlipidemia 09/13/2020   Neuropathy 09/13/2020   Polyp of colon 09/13/2020   Proteinuria 09/13/2020   Latent tuberculosis 08/27/2020   Medication monitoring encounter 08/27/2020   Immunization counseling 08/27/2020   TB lung, latent 07/25/2020   Diabetes mellitus with stage 5 chronic kidney disease ([place]) 05/02/2020   Thyroid nodule 05/02/2020   Special screening for malignant neoplasms, colon     Orientation RESPIRATION BLADDER Height & Weight     Place, Situation, Time, Self  Normal Continent Weight: 213 lb 13.5 oz (97 kg) Height:  5\' 11"  (180.3 cm)  BEHAVIORAL SYMPTOMS/MOOD NEUROLOGICAL BOWEL NUTRITION STATUS      Continent Diet (see d/c summary)  AMBULATORY STATUS COMMUNICATION OF NEEDS Skin   Extensive Assist Verbally Surgical wounds                       Personal Care Assistance Level of  Assistance  Bathing, Dressing, Feeding Bathing Assistance: Limited assistance Feeding assistance: Independent Dressing Assistance: Limited assistance     Functional Limitations Info  Speech, Hearing, Sight Sight Info: Adequate Hearing Info: Adequate Speech Info: Adequate    SPECIAL CARE FACTORS FREQUENCY  PT (By licensed PT), OT (By licensed OT)     PT Frequency: 5x/ week OT Frequency: 5x/ week            Contractures Contractures Info: Not present    Additional Factors Info  Code Status, Allergies, Insulin Sliding Scale Code Status Info: Full Allergies Info: NKA   Insulin Sliding Scale Info: see d/c med list       Current Medications (10/10/2020):  This is the current hospital active medication list Current Facility-Administered Medications  Medication Dose Route Frequency Provider Last Rate Last Admin   (feeding supplement) PROSource Plus liquid 30 mL  30 mL Oral TID BM Swinteck, Aaron Edelman, MD   30 mL at 10/10/20 1346   aspirin EC tablet 325 mg  325 mg Oral Q breakfast Swinteck, Aaron Edelman, MD   325 mg at 10/10/20 1144   carvedilol (COREG) tablet 6.25 mg  6.25 mg Oral BID WC Swinteck, Aaron Edelman, MD   6.25 mg at 10/10/20 1625   chlorhexidine (HIBICLENS) 4 % liquid 4 application  60 mL Topical Once Rod Can, MD       Chlorhexidine Gluconate Cloth 2 % PADS 6 each  6 each Topical Q0600 Rosita Fire, MD   6 each at 10/10/20 1145  cholecalciferol (VITAMIN D3) tablet 1,000 Units  1,000 Units Oral Daily Swinteck, Aaron Edelman, MD   1,000 Units at 10/10/20 1146   cloNIDine (CATAPRES) tablet 0.2 mg  0.2 mg Oral TID Rod Can, MD   0.2 mg at 10/10/20 1625   docusate sodium (COLACE) capsule 100 mg  100 mg Oral BID Rod Can, MD   100 mg at 10/10/20 1144   hydrALAZINE (APRESOLINE) injection 10 mg  10 mg Intravenous Q4H PRN Rod Can, MD   10 mg at 10/10/20 1206   hydrALAZINE (APRESOLINE) tablet 50 mg  50 mg Oral BID Rod Can, MD   50 mg at 10/10/20 1030    HYDROcodone-acetaminophen (NORCO/VICODIN) 5-325 MG per tablet 1-2 tablet  1-2 tablet Oral Q6H PRN Rod Can, MD   2 tablet at 10/10/20 1037   HYDROcodone-acetaminophen (NORCO/VICODIN) 5-325 MG per tablet            HYDROmorphone (DILAUDID) injection 0.5 mg  0.5 mg Intravenous Q3H PRN Rod Can, MD   0.5 mg at 10/09/20 2217   insulin aspart (novoLOG) injection 0-6 Units  0-6 Units Subcutaneous TID WC Rod Can, MD   2 Units at 10/08/20 1736   insulin detemir (LEVEMIR) injection 8 Units  8 Units Subcutaneous QHS Rod Can, MD   8 Units at 10/09/20 2213   isoniazid (NYDRAZID) tablet 300 mg  300 mg Oral Daily Swinteck, Aaron Edelman, MD   300 mg at 10/10/20 1146   menthol-cetylpyridinium (CEPACOL) lozenge 3 mg  1 lozenge Oral PRN Swinteck, Aaron Edelman, MD       Or   phenol (CHLORASEPTIC) mouth spray 1 spray  1 spray Mouth/Throat PRN Swinteck, Aaron Edelman, MD       metoCLOPramide (REGLAN) tablet 5 mg  5 mg Oral Q8H PRN Swinteck, Aaron Edelman, MD       Or   metoCLOPramide (REGLAN) injection 5 mg  5 mg Intravenous Q8H PRN Swinteck, Aaron Edelman, MD       multivitamin (RENA-VIT) tablet 1 tablet  1 tablet Oral QHS Swinteck, Aaron Edelman, MD       naloxone [facility]) injection 0.4 mg  0.4 mg Intravenous PRN Alfredia Ferguson, Omair Latif, DO   0.4 mg at 10/10/20 1212   ondansetron (ZOFRAN) tablet 4 mg  4 mg Oral Q6H PRN Swinteck, Aaron Edelman, MD       Or   ondansetron [facility]) injection 4 mg  4 mg Intravenous Q6H PRN Swinteck, Aaron Edelman, MD   4 mg at 10/09/20 2239   polyethylene glycol (MIRALAX / GLYCOLAX) packet 17 g  17 g Oral Daily PRN Swinteck, Aaron Edelman, MD       povidone-iodine 10 % swab 2 application  2 application Topical Once Swinteck, Aaron Edelman, MD       povidone-iodine 10 % swab 2 application  2 application Topical Once Swinteck, Aaron Edelman, MD       pyridOXINE (VITAMIN B-6) tablet 50 mg  50 mg Oral Daily Swinteck, Aaron Edelman, MD   50 mg at 10/10/20 1145   rosuvastatin (CRESTOR) tablet 10 mg  10 mg Oral Daily Rod Can, MD   10 mg at 10/10/20  1144     Discharge Medications: Please see discharge summary for a list of discharge medications.  Relevant Imaging Results:  Relevant Lab Results:   Additional Information SSN: [id number]  Paulene Floor Azelea Seguin, LCSWA

## 2020-10-10 NOTE — TOC Progression Note (Addendum)
Transition of Care [facility]) - Progression Note    Patient Details  Name: Trevor [name] MRN: [id number] Date of Birth: 1940/08/19  Transition of Care [facility]) CM/SW Contact  Milinda Antis, [place] Phone Number: 10/10/2020, 3:46 PM  Clinical Narrative:    15:46-  CSW contacted [employer] to inquire about whether the patient's insurance is managed through this company.  CSW was informed that the agency would have to create a ticket so that the case could be assessed to see if [employer] manages.  This process could take a few hours.    16:47-  CSW received a returned call from [place].  The company does not manage the patient's insurance.  SNF will start auth when bed offer is secured.          Expected Discharge Plan and Services                                                 Social Determinants of Health (SDOH) Interventions    Readmission Risk Interventions No flowsheet data found.

## 2020-10-10 NOTE — Procedures (Addendum)
Patient was seen on dialysis and the procedure was supervised.  BFR 400  Via TDC BP is  166/90. Patient appears to be tolerating treatment well. Plan for next HD tomorrow as per MWF schedule.   Trevor [name] 10/10/2020

## 2020-10-10 NOTE — TOC Initial Note (Signed)
Transition of Care [facility]) - Initial/Assessment Note    Patient Details  Name: Trevor [name] MRN: [id number] Date of Birth: 1940-08-13  Transition of Care [facility] CM/SW Contact:    Milinda Antis, [place] Phone Number: 10/10/2020, 4:04 PM  Clinical Narrative:                 CSW received consult for possible SNF placement at time of discharge. CSW spoke with patient and the patient's wife. Patient expressed understanding of PT recommendation and is agreeable to SNF placement at time of discharge. Patient reports preference for [facility]. CSW discussed insurance authorization process. Patient expressed being hopeful for rehab and to feel better soon. No further questions reported at this time.     Expected Discharge Plan: Skilled Nursing Facility Barriers to Discharge: Insurance Authorization, SNF Pending bed offer   Patient Goals and CMS Choice Patient states their goals for this hospitalization and ongoing recovery are:: Get better CMS Medicare.gov Compare Post Acute Care list provided to:: Patient Choice offered to / list presented to : Patient  Expected Discharge Plan and Services Expected Discharge Plan: [place]       Living arrangements for the past 2 months: Single Family Home                                      Prior Living Arrangements/Services Living arrangements for the past 2 months: Single Family Home Lives with:: Spouse Patient language and need for interpreter reviewed:: Yes Do you feel safe going back to the place where you live?: Yes      Need for Family Participation in Patient Care: Yes (Comment) Care giver support system in place?: Yes (comment)   Criminal Activity/Legal Involvement Pertinent to Current Situation/Hospitalization: No - Comment as needed  Activities of Daily Living   ADL Screening (condition at time of admission) Patient's cognitive ability adequate to safely complete daily activities?: Yes Is the patient deaf  or have difficulty hearing?: No Does the patient have difficulty seeing, even when wearing glasses/contacts?: No Does the patient have difficulty concentrating, remembering, or making decisions?: No Patient able to express need for assistance with ADLs?: Yes Does the patient have difficulty dressing or bathing?: No Independently performs ADLs?: Yes (appropriate for developmental age) (prior to admission) Does the patient have difficulty walking or climbing stairs?: Yes Weakness of Legs: Right Weakness of Arms/Hands: None  Permission Sought/Granted   Permission granted to share information with : Yes, Verbal Permission Granted     Permission granted to share info w AGENCY: SNF        Emotional Assessment   Attitude/Demeanor/Rapport: Other (comment) (slow to respond)   Orientation: : Oriented to Self, Oriented to Place, Oriented to Situation Alcohol / Substance Use: Not Applicable Psych Involvement: No (comment)  Admission diagnosis:  Hip fracture ([place]) [S72.009A] Bilateral leg pain [M79.604, M79.605] Bilateral leg edema [R60.0] Immobility [Z74.09] Closed right hip fracture, initial encounter ([place]) [S72.001A] Patient Active Problem List   Diagnosis Date Noted   Hip fracture ([place]) 10/08/2020   End stage renal disease ([place]) 10/08/2020   Hyponatremia 10/08/2020   Hyperglycemia 10/08/2020   Hip pain 10/08/2020   History of renal dialysis 09/13/2020   Anemia 09/13/2020   Edema 09/13/2020   Essential hypertension 09/13/2020   Gastroesophageal reflux disease 09/13/2020   Hyperlipidemia 09/13/2020   Neuropathy 09/13/2020   Polyp of colon 09/13/2020   Proteinuria 09/13/2020  Latent tuberculosis 08/27/2020   Medication monitoring encounter 08/27/2020   Immunization counseling 08/27/2020   TB lung, latent 07/25/2020   Diabetes mellitus with stage 5 chronic kidney disease ([place]) 05/02/2020   Thyroid nodule 05/02/2020   Special screening for malignant neoplasms, colon    PCP:   Celene Squibb, MD Pharmacy:   [place], [place] - [address] [id number] [address] [place] [id number] Phone: [phone] Fax: [phone]     Social Determinants of Health (SDOH) Interventions    Readmission Risk Interventions No flowsheet data found.

## 2020-10-10 NOTE — Significant Event (Signed)
Rapid Response Event Note   Reason for Call :  AMS, confusion, not following commands  Initial Focused Assessment:  Patient has  recently returned from his dialysis treatment.  He is alert but confused and has generalized myoclonus.   He is warm and dry.   Lung sounds are decreased base, heart tones regular.  BP 211/91  HR 107  RR 20  O2 sat 100% on RA  Temp 99 Manual BP 210/70  Dr Alfredia Ferguson and Dr Rory Percy at bedside to assess patient.  Patient has difficulty answering some questions but able to recognize objects and follow simple commands.  He is unable to state his name or month.  R leg painful, but no focal weakness noted.  Sensory intact, no visual loss, smile symmetrical, no neglect.   Interventions:  10mg  Hydralazine given IV x2 Narcan given IV, no change in mental status. Labs drawn ABG done  Plan of Care:  MRI    Event Summary:   MD Notified: Alfredia Ferguson Call Time: [place] Time: 1205 End Time: [place]  Raliegh Ip, RN

## 2020-10-11 ENCOUNTER — Inpatient Hospital Stay (HOSPITAL_COMMUNITY): Payer: Medicare HMO

## 2020-10-11 DIAGNOSIS — G9341 Metabolic encephalopathy: Secondary | ICD-10-CM

## 2020-10-11 LAB — GLUCOSE, CAPILLARY
Glucose-Capillary: 182 mg/dL — ABNORMAL HIGH (ref 70–99)
Glucose-Capillary: 197 mg/dL — ABNORMAL HIGH (ref 70–99)
Glucose-Capillary: 213 mg/dL — ABNORMAL HIGH (ref 70–99)
Glucose-Capillary: 227 mg/dL — ABNORMAL HIGH (ref 70–99)

## 2020-10-11 LAB — CBC WITH DIFFERENTIAL/PLATELET
Abs Immature Granulocytes: 0.05 10*3/uL (ref 0.00–0.07)
Basophils Absolute: 0 10*3/uL (ref 0.0–0.1)
Basophils Relative: 0 %
Eosinophils Absolute: 0.1 10*3/uL (ref 0.0–0.5)
Eosinophils Relative: 1 %
HCT: 27.9 % — ABNORMAL LOW (ref 39.0–52.0)
Hemoglobin: 9.1 g/dL — ABNORMAL LOW (ref 13.0–17.0)
Immature Granulocytes: 0 %
Lymphocytes Relative: 6 %
Lymphs Abs: 0.7 10*3/uL (ref 0.7–4.0)
MCH: 30.8 pg (ref 26.0–34.0)
MCHC: 32.6 g/dL (ref 30.0–36.0)
MCV: 94.6 fL (ref 80.0–100.0)
Monocytes Absolute: 1.7 10*3/uL — ABNORMAL HIGH (ref 0.1–1.0)
Monocytes Relative: 15 %
Neutro Abs: 8.9 10*3/uL — ABNORMAL HIGH (ref 1.7–7.7)
Neutrophils Relative %: 78 %
Platelets: 211 10*3/uL (ref 150–400)
RBC: 2.95 MIL/uL — ABNORMAL LOW (ref 4.22–5.81)
RDW: 16 % — ABNORMAL HIGH (ref 11.5–15.5)
WBC: 11.4 10*3/uL — ABNORMAL HIGH (ref 4.0–10.5)
nRBC: 0 % (ref 0.0–0.2)

## 2020-10-11 LAB — MAGNESIUM: Magnesium: 1.7 mg/dL (ref 1.7–2.4)

## 2020-10-11 LAB — COMPREHENSIVE METABOLIC PANEL
ALT: 10 U/L (ref 0–44)
AST: 18 U/L (ref 15–41)
Albumin: 2.6 g/dL — ABNORMAL LOW (ref 3.5–5.0)
Alkaline Phosphatase: 35 U/L — ABNORMAL LOW (ref 38–126)
Anion gap: 9 (ref 5–15)
BUN: 31 mg/dL — ABNORMAL HIGH (ref 8–23)
CO2: 27 mmol/L (ref 22–32)
Calcium: 8.4 mg/dL — ABNORMAL LOW (ref 8.9–10.3)
Chloride: 97 mmol/L — ABNORMAL LOW (ref 98–111)
Creatinine, Ser: 4.45 mg/dL — ABNORMAL HIGH (ref 0.61–1.24)
GFR, Estimated: 13 mL/min — ABNORMAL LOW (ref >60)
Glucose, Bld: 195 mg/dL — ABNORMAL HIGH (ref 70–99)
Potassium: 3.6 mmol/L (ref 3.5–5.1)
Sodium: 133 mmol/L — ABNORMAL LOW (ref 135–145)
Total Bilirubin: 0.8 mg/dL (ref 0.3–1.2)
Total Protein: 6 g/dL — ABNORMAL LOW (ref 6.5–8.1)

## 2020-10-11 LAB — PHOSPHORUS: Phosphorus: 4.7 mg/dL — ABNORMAL HIGH (ref 2.5–4.6)

## 2020-10-11 IMAGING — MR MR HEAD W/O CM
6 of 11 series · 24 of 48 positions shown · non-contrast
Comparison: None.

CLINICAL DATA: Mental status change

EXAM:
MRI HEAD WITHOUT CONTRAST
TECHNIQUE: Multiplanar, multiecho pulse sequences of the brain and surrounding
structures were obtained without intravenous contrast.

[Series 2: DWI · axial · 3.0mm · 0.94mm/px · z∈[-75,+71]mm · 7 of 100 slices shown (1 of 2)]
[im 1/100]
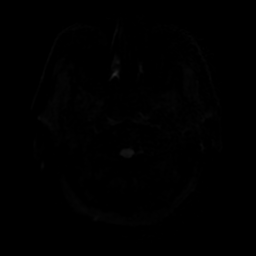
[im 17/100]
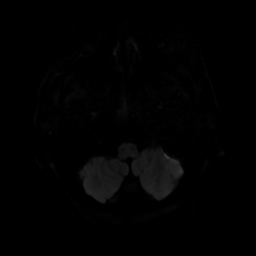
[im 34/100]
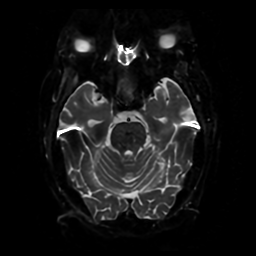
[im 50/100]
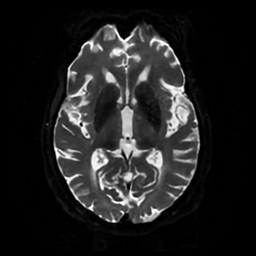
[im 67/100]
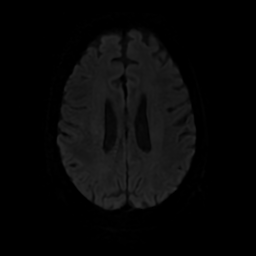
[im 83/100]
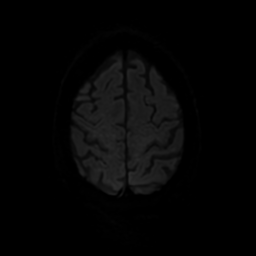
[im 100/100]
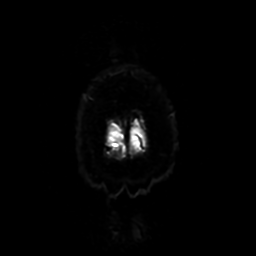

[Series 3: DWI · coronal · 4.0mm · 0.94mm/px · 5 of 76 slices shown (2 of 2)]
[im 1/76]
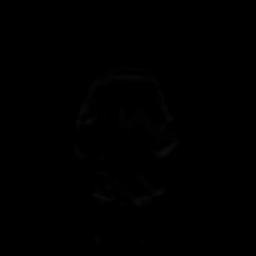
[im 19/76]
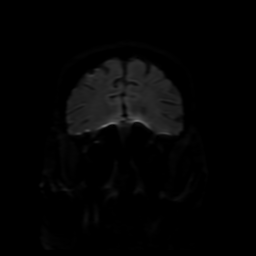
[im 38/76]
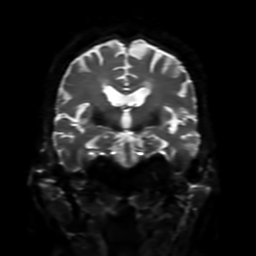
[im 57/76]
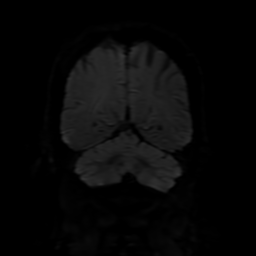
[im 76/76]
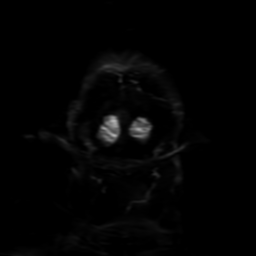

[Series 4: FLAIR · sagittal · 5.0mm · 0.23mm/px · 2 of 25 slices shown (1 of 2)]
[im 1/25]
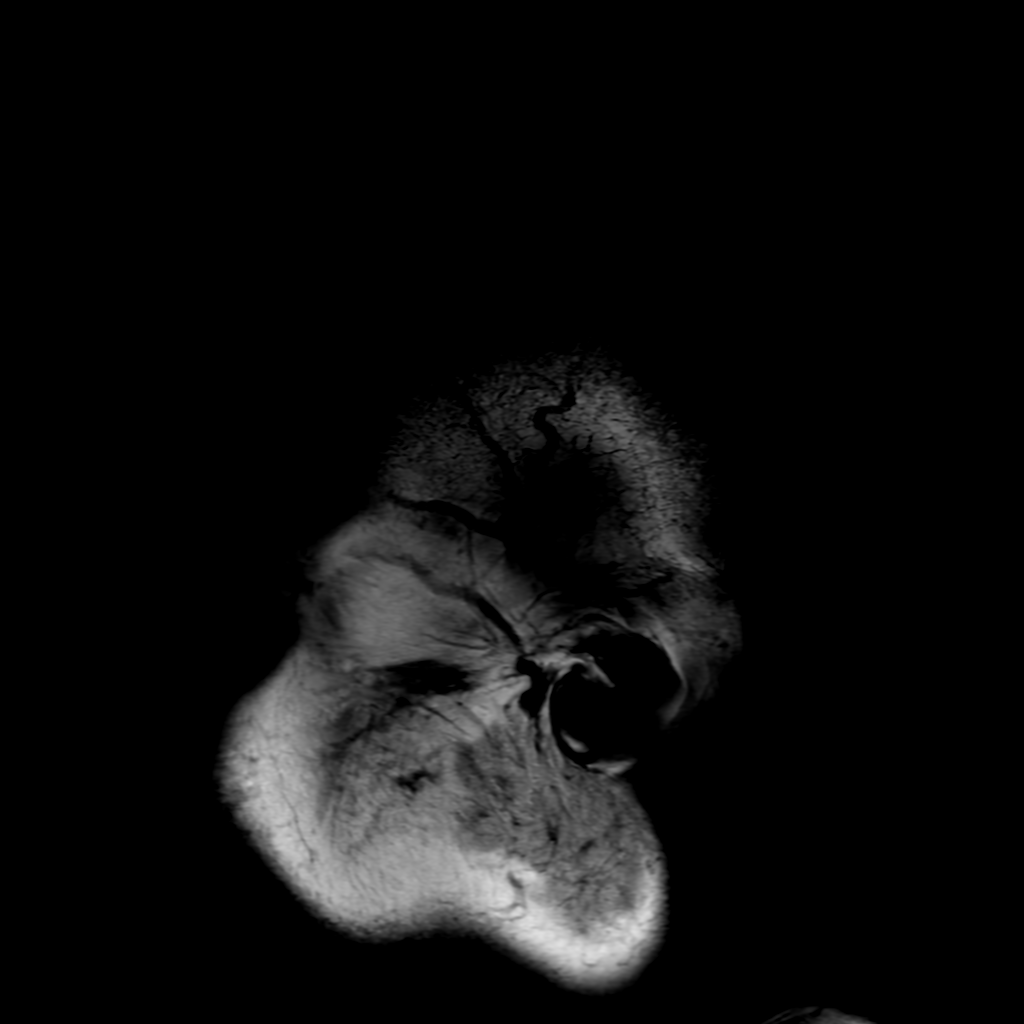
[im 25/25]
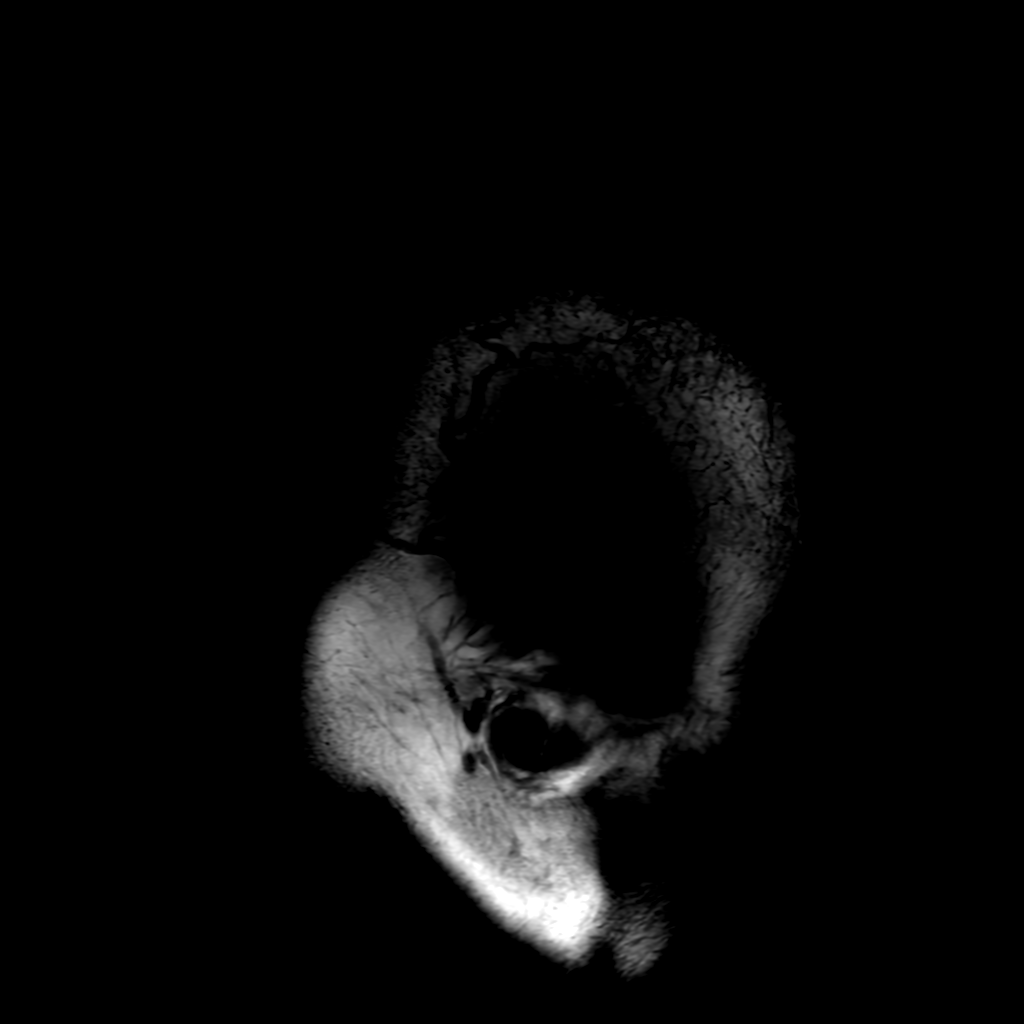

[Series 6: FLAIR · axial · 4.0mm · 0.45mm/px · z∈[-76,+73]mm · 3 of 35 slices shown (2 of 2)]
[im 1/35]
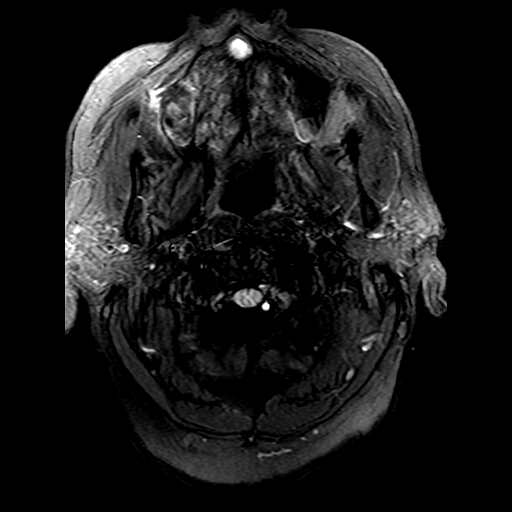
[im 18/35]
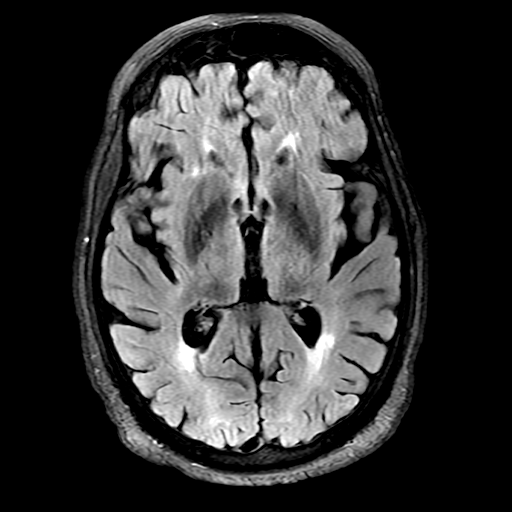
[im 35/35]
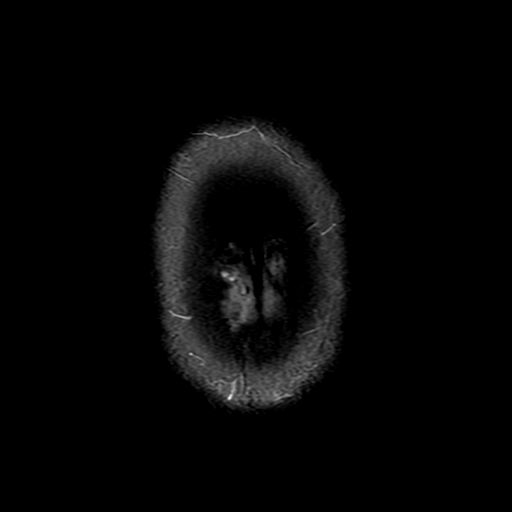

[Series 250: ADC · axial · 3.0mm · 0.94mm/px · z∈[-75,+71]mm · 4 of 50 slices shown (1 of 2)]
[im 1/50]
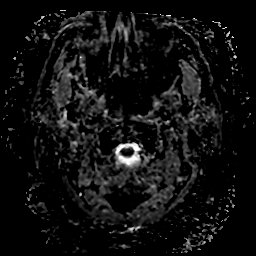
[im 17/50]
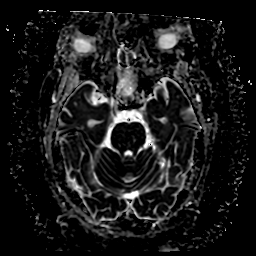
[im 33/50]
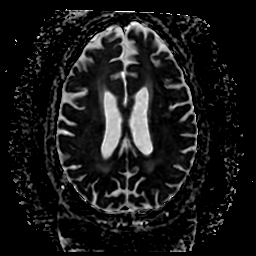
[im 50/50]
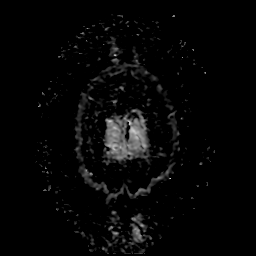

[Series 350: ADC · coronal · 4.0mm · 0.94mm/px · 3 of 37 slices shown (2 of 2)]
[im 1/37]
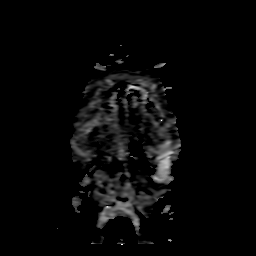
[im 19/37]
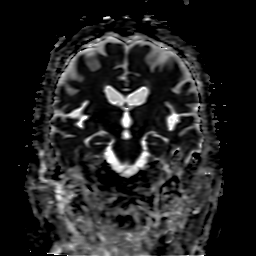
[im 37/37]
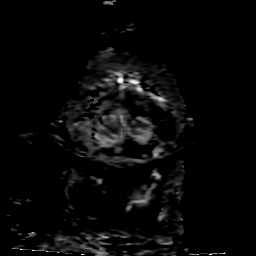

[24 of 48 positions shown; findings below may reference images not displayed]

FINDINGS: Motion artifact is present.

Brain: There is no acute infarction or intracranial hemorrhage.
There is no intracranial mass, mass effect, or edema. There is no
hydrocephalus or extra-axial fluid collection. Prominence of the
ventricles and sulci reflects generalized parenchymal volume loss.
Patchy and confluent areas of T2 hyperintensity in the
supratentorial and pontine white matter nonspecific but may reflect
mild to moderate chronic microvascular ischemic changes. There is a
chronic infarct of the left parasagittal pons.

Vascular: Major vessel flow voids at the skull base are preserved.

Skull and upper cervical spine: Normal marrow signal is preserved.

Sinuses/Orbits: Paranasal sinuses are aerated. Orbits are
unremarkable.

Other: Sella is unremarkable.  Mastoid air cells are clear.
IMPRESSION: No evidence of recent infarction, hemorrhage, or mass.

Chronic microvascular ischemic changes.  Chronic pontine infarct.

## 2020-10-11 MED ORDER — ASPIRIN 81 MG PO CHEW: 81.0000 mg | CHEWABLE_TABLET | Freq: Two times a day (BID) | ORAL | 0 refills | Status: AC

## 2020-10-11 MED ORDER — HYDROCODONE-ACETAMINOPHEN 5-325 MG PO TABS: 1.0000 | ORAL_TABLET | Freq: Four times a day (QID) | ORAL | 0 refills | Status: DC | PRN

## 2020-10-11 NOTE — Progress Notes (Signed)
PT Cancellation Note  Patient Details Name: Trevor [name] MRN: [id number] DOB: 12-07-1940   Cancelled Treatment:    Reason Eval/Treat Not Completed: Patient at procedure or test/unavailable.  Pt wanted to wait for pain meds this AM, but when PT returned he was gone to MRI.  Has HD as well today.  Retry as time and pt allow.   Ramond Dial 10/11/2020, 12:39 PM  Mee Hives, PT MS Acute Rehab Dept. Number: [place]

## 2020-10-11 NOTE — Plan of Care (Signed)

## 2020-10-11 NOTE — TOC Progression Note (Addendum)
Transition of Care [facility]) - Progression Note    Patient Details  Name: CARTEZ MOGLE MRN: [id number] Date of Birth: 08/29/1940  Transition [facility]) CM/SW Contact  Milinda Antis, [place] Phone Number: 10/11/2020, 8:29 AM  Clinical Narrative:    08:29-  CSW called [facility] to inquire about the agencies availability to accept the patient.  CSW spoke with Morey Hummingbird who informed CSW that she will have more information about accepting new patients after their morning meeting.    12:45-  CSW met with patient at beside and explained that [facility] denied.  The patient then stated that he was willing to go to [place].  CSW faxed referral to [facility] for review.   13:25-  CSW called Debbie with [facility] and inquired about the facilities ability to accept the patient.  CSW was informed that Jackelyn Poling would review the referral by tomorrow.  CSW informed Jackelyn Poling that the facility would have to begin insurance auth because the patient is not managed through [facility].     Expected Discharge Plan: Skilled Nursing Facility Barriers to Discharge: Ship broker, SNF Pending bed offer  Expected Discharge Plan and Services Expected Discharge Plan: [place] arrangements for the past 2 months: Single Family Home                                       Social Determinants of Health (SDOH) Interventions    Readmission Risk Interventions No flowsheet data found.

## 2020-10-11 NOTE — Progress Notes (Signed)
Trevor [name]  Assessment/ Plan: Pt is a 80 y.o. yo male  with history of hypertension, DM, HLD, nephrolithiasis, ESRD on HD MWF at [facility] followed by Dr. Theador Hawthorne, transferred from [facility] for displaced femoral neck fracture, seen as a consultation for the management of ESRD.  #Right femoral neck fracture: Seen by orthopedics and underwent right total hip arthroplasty on 6/29.  PT OT and rehab per primary and orthopedics team.    # ESRD: MWF at [facility].  He received dialysis yesterday off schedule.  Plan for dialysis today as per his regular schedule.  Volume status acceptable therefore we will lower UF goal to only 2.5 L.  Right IJ TDC for the access.   # Hypertension: On antihypertensive.  Volume management by ultrafiltration.   # Anemia of ESRD: Hemoglobin stable.  Need outpatient record about ESA treatment.  Monitor hemoglobin.   # Metabolic Bone Disease: Calcium and phosphorus level at goal.  #Altered mental status: Seen by neurology.  Plan for MRI today.  He looks alert awake and seems to be around his baseline.  Subjective: Seen and examined at bedside.  He was alert awake and looks comfortable.  He had dialysis yesterday where only 1 L ultrafiltration.  Today he denies headache, dizziness, nausea, vomiting, chest pain, shortness of breath.  Plan for short dialysis today to resume HD schedule.  Patient agreed. Objective Vital signs in last 24 hours: Vitals:   10/10/20 1605 10/10/20 1735 10/10/20 2004 10/11/20 0940  BP: (!) 190/71 (!) 164/72 (!) 169/59 (!) 155/67  Pulse: 100 93 90 79  Resp: 20 20 16 17   Temp: 99.3 F (37.4 C)  99.3 F (37.4 C) 98.7 F (37.1 C)  TempSrc: Oral Oral Oral Oral  SpO2: 100%  98% 97%  Weight:      Height:       Weight change:   Intake/Output Summary (Last 24 hours) at 10/11/2020 1119 Last data filed at 10/10/2020 1313 Gross per 24 hour  Intake --  Output 1200 ml  Net -1200 ml        Labs: Basic Metabolic Panel: Recent Labs  Lab 10/09/20 0558 10/09/20 1011 10/10/20 0324 10/11/20 0442  NA 133*  --  134* 133*  K 3.7  --  4.5 3.6  CL 99  --  101 97*  CO2 23  --  21* 27  GLUCOSE 219*  --  213* 195*  BUN 51*  --  54* 31*  CREATININE 5.05*  --  5.53* 4.45*  CALCIUM 8.9  --  8.7* 8.4*  PHOS 4.7* 4.4 5.1* 4.7*   Liver Function Tests: Recent Labs  Lab 10/09/20 0558 10/10/20 0324 10/11/20 0442  AST  --   --  18  ALT  --   --  10  ALKPHOS  --   --  35*  BILITOT  --   --  0.8  PROT  --   --  6.0*  ALBUMIN 2.8* 3.0* 2.6*   No results for input(s): LIPASE, AMYLASE in the last 168 hours. Recent Labs  Lab 10/10/20 1236  AMMONIA 21   CBC: Recent Labs  Lab 10/08/20 1224 10/09/20 0558 10/09/20 1011 10/10/20 0324 10/11/20 0442  WBC 7.9 8.0 7.9 11.7* 11.4*  NEUTROABS 6.3  --  6.3  --  8.9*  HGB 9.9* 9.9* 10.0* 9.7* 9.1*  HCT 31.4* 30.2* 31.0* 30.5* 27.9*  MCV 97.5 93.8 94.5 96.5 94.6  PLT 231 224 247 196 211  Cardiac Enzymes: No results for input(s): CKTOTAL, CKMB, CKMBINDEX, TROPONINI in the last 168 hours. CBG: Recent Labs  Lab 10/10/20 1153 10/10/20 1603 10/10/20 2234 10/11/20 0304 10/11/20 0627  GLUCAP 137* 205* 229* 182* 197*    Iron Studies: No results for input(s): IRON, TIBC, TRANSFERRIN, FERRITIN in the last 72 hours. Studies/Results: Pelvis Portable  Result Date: 10/09/2020 CLINICAL DATA:  Postop EXAM: PORTABLE PELVIS 1-2 VIEWS COMPARISON:  10/09/2020, 10/08/2020 FINDINGS: Interval right hip replacement with intact hardware and normal alignment. Gas in the soft tissues consistent with recent surgery. IMPRESSION: Interval right hip replacement with expected postsurgical changes Electronically Signed   By: Donavan Foil M.D.   On: 10/09/2020 19:53   DG C-Arm 1-60 Min  Result Date: 10/09/2020 CLINICAL DATA:  Total hip arthroplasty. EXAM: DG C-ARM 1-60 MIN; OPERATIVE RIGHT HIP WITH PELVIS FLUOROSCOPY TIME:  Fluoroscopy Time:   11 seconds. Radiation Exposure Index (if provided by the fluoroscopic device): 1.31 mGy. Number of Acquired Spot Images: 7 COMPARISON:  October 08, 2020. FINDINGS: Seven C-arm fluoroscopic images were obtained intraoperatively and submitted for post operative interpretation. These images demonstrate sequential changes associated with right total hip arthroplasty. No unexpected findings. No unexpected radiopaque foreign bodies. Please see the performing provider's procedural report for further detail. IMPRESSION: Right total hip arthroplasty. Electronically Signed   By: Margaretha Sheffield MD   On: 10/09/2020 17:40   DG HIP OPERATIVE UNILAT WITH PELVIS RIGHT  Result Date: 10/09/2020 CLINICAL DATA:  Total hip arthroplasty. EXAM: DG C-ARM 1-60 MIN; OPERATIVE RIGHT HIP WITH PELVIS FLUOROSCOPY TIME:  Fluoroscopy Time:  11 seconds. Radiation Exposure Index (if provided by the fluoroscopic device): 1.31 mGy. Number of Acquired Spot Images: 7 COMPARISON:  October 08, 2020. FINDINGS: Seven C-arm fluoroscopic images were obtained intraoperatively and submitted for post operative interpretation. These images demonstrate sequential changes associated with right total hip arthroplasty. No unexpected findings. No unexpected radiopaque foreign bodies. Please see the performing provider's procedural report for further detail. IMPRESSION: Right total hip arthroplasty. Electronically Signed   By: Margaretha Sheffield MD   On: 10/09/2020 17:40    Medications: Infusions:   Scheduled Medications:  (feeding supplement) PROSource Plus  30 mL Oral TID BM   aspirin EC  325 mg Oral Q breakfast   carvedilol  6.25 mg Oral BID WC   chlorhexidine  60 mL Topical Once   Chlorhexidine Gluconate Cloth  6 each Topical Q0600   cholecalciferol  1,000 Units Oral Daily   cloNIDine  0.2 mg Oral TID   docusate sodium  100 mg Oral BID   hydrALAZINE  50 mg Oral BID   insulin aspart  0-6 Units Subcutaneous TID WC   insulin detemir  8 Units  Subcutaneous QHS   isoniazid  300 mg Oral Daily   multivitamin  1 tablet Oral QHS   povidone-iodine  2 application Topical Once   povidone-iodine  2 application Topical Once   pyridOXINE  50 mg Oral Daily   rosuvastatin  10 mg Oral Daily    have reviewed scheduled and prn medications.  Physical Exam: General:NAD, comfortable Heart:RRR, s1s2 nl Lungs:clear b/l, no crackle Abdomen:soft, Non-tender, non-distended Extremities:No edema Dialysis Access: Right IJ TDC.  Ilo Beamon Prasad Latausha Flamm 10/11/2020,11:19 AM  LOS: 3 days

## 2020-10-11 NOTE — Progress Notes (Signed)
    Subjective:  Patient reports pain as mild.  Denies N/V/CP/SOB.   Objective:   VITALS:   Vitals:   10/10/20 1232 10/10/20 1605 10/10/20 1735 10/10/20 2004  BP: (!) 174/69 (!) 190/71 (!) 164/72 (!) 169/59  Pulse: (!) 103 100 93 90  Resp: 20 20 20 16   Temp:  99.3 F (37.4 C)  99.3 F (37.4 C)  TempSrc: Oral Oral Oral Oral  SpO2:  100%  98%  Weight:      Height:        NAD ABD soft Neurovascular intact Sensation intact distally Intact pulses distally Dorsiflexion/Plantar flexion intact Incision: dressing C/D/I   Lab Results  Component Value Date   WBC 11.4 (H) 10/11/2020   HGB 9.1 (L) 10/11/2020   HCT 27.9 (L) 10/11/2020   MCV 94.6 10/11/2020   PLT 211 10/11/2020   BMET    Component Value Date/Time   NA 133 (L) 10/11/2020 0442   K 3.6 10/11/2020 0442   CL 97 (L) 10/11/2020 0442   CO2 27 10/11/2020 0442   GLUCOSE 195 (H) 10/11/2020 0442   BUN 31 (H) 10/11/2020 0442   BUN 60 (A) 04/30/2020 0000   CREATININE 4.45 (H) 10/11/2020 0442   CREATININE 7.30 (H) 08/27/2020 0000   CALCIUM 8.4 (L) 10/11/2020 0442   GFRNONAA 13 (L) 10/11/2020 0442   GFRAA 12 04/30/2020 0000     Assessment/Plan: 2 Days Post-Op   Principal Problem:   Hip fracture (HCC) Active Problems:   Diabetes mellitus with stage 5 chronic kidney disease (HCC)   Anemia   Gastroesophageal reflux disease   Hyperlipidemia   Neuropathy   End stage renal disease (HCC)   Hyponatremia   Hyperglycemia   Hip pain   WBAT with walker DVT ppx: Aspirin, SCDs, TEDS PO pain control PT/OT ABLA: HgB 9.1 this AM, treat per hospitalist recommendations Dispo: D/C pending, follow up with Dr.Swinteck at [facility] in 2 weeks     Dorothyann Peng 10/11/2020, 7:39 AM  [facility] is now [employer] [address], [place], [place] [address] Phone: [phone] [email or web address] Facebook  [employer]

## 2020-10-11 NOTE — Progress Notes (Signed)
-Neurology Progress Note   S:// Seen and examined.  Awake alert eating breakfast in bed.   O:// Current vital signs: BP (!) 155/67 (BP Location: Right Arm)   Pulse 79   Temp 98.7 F (37.1 C) (Oral)   Resp 17   Ht 5\' 11"  (1.803 m)   Wt 97 kg   SpO2 97%   BMI 29.83 kg/m  Vital signs in last 24 hours: Temp:  [98.7 F (37.1 C)-99.6 F (37.6 C)] 98.7 F (37.1 C) (07/01 0940) Pulse Rate:  [79-107] 79 (07/01 0940) Resp:  [16-20] 17 (07/01 0940) BP: (143-211)/(57-91) 155/67 (07/01 0940) SpO2:  [97 %-100 %] 97 % (07/01 0940) Weight:  [97 kg] 97 kg (06/30 1123) General: Awake alert in no distress HEENT: Normocephalic/atraumatic Lungs: Clear Cardiovascular: Regular rhythm Abdomen nondistended nontender Extremities with pain and restricted movement of the right hip otherwise unremarkable. Neurological examination awake alert oriented x3 Noticed no dysarthria or aphasia today. Cranial nerves II to XII intact Motor examination with right hip weakness due to recent hip fracture and surgery otherwise unremarkable. Sensory exam intact No dysmetria  Medications  Current Facility-Administered Medications:    (feeding supplement) PROSource Plus liquid 30 mL, 30 mL, Oral, TID BM, Swinteck, Aaron Edelman, MD, 30 mL at 10/11/20 0945   aspirin EC tablet 325 mg, 325 mg, Oral, Q breakfast, Swinteck, Aaron Edelman, MD, 325 mg at 10/11/20 0945   carvedilol (COREG) tablet 6.25 mg, 6.25 mg, Oral, BID WC, Swinteck, Aaron Edelman, MD, 6.25 mg at 10/11/20 0945   chlorhexidine (HIBICLENS) 4 % liquid 4 application, 60 mL, Topical, Once, Swinteck, Aaron Edelman, MD   Chlorhexidine Gluconate Cloth 2 % PADS 6 each, 6 each, Topical, Q0600, Rosita Fire, MD, 6 each at 10/10/20 1145   cholecalciferol (VITAMIN D3) tablet 1,000 Units, 1,000 Units, Oral, Daily, Swinteck, Aaron Edelman, MD, 1,000 Units at 10/11/20 0946   cloNIDine (CATAPRES) tablet 0.2 mg, 0.2 mg, Oral, TID, Swinteck, Aaron Edelman, MD, 0.2 mg at 10/11/20 0945   docusate sodium  (COLACE) capsule 100 mg, 100 mg, Oral, BID, Swinteck, Aaron Edelman, MD, 100 mg at 10/11/20 0946   hydrALAZINE (APRESOLINE) injection 10 mg, 10 mg, Intravenous, Q4H PRN, Rod Can, MD, 10 mg at 10/10/20 1206   hydrALAZINE (APRESOLINE) tablet 50 mg, 50 mg, Oral, BID, Swinteck, Aaron Edelman, MD, 50 mg at 10/11/20 0946   HYDROcodone-acetaminophen (NORCO/VICODIN) 5-325 MG per tablet 1-2 tablet, 1-2 tablet, Oral, Q6H PRN, Rod Can, MD, 1 tablet at 10/11/20 1025   HYDROmorphone (DILAUDID) injection 0.5 mg, 0.5 mg, Intravenous, Q3H PRN, Rod Can, MD, 0.5 mg at 10/09/20 2217   insulin aspart (novoLOG) injection 0-6 Units, 0-6 Units, Subcutaneous, TID WC, Swinteck, Aaron Edelman, MD, 1 Units at 10/11/20 0954   insulin detemir (LEVEMIR) injection 8 Units, 8 Units, Subcutaneous, QHS, Swinteck, Aaron Edelman, MD, 8 Units at 10/10/20 2238   isoniazid (NYDRAZID) tablet 300 mg, 300 mg, Oral, Daily, Swinteck, Aaron Edelman, MD, 300 mg at 10/11/20 0946   menthol-cetylpyridinium (CEPACOL) lozenge 3 mg, 1 lozenge, Oral, PRN **OR** phenol (CHLORASEPTIC) mouth spray 1 spray, 1 spray, Mouth/Throat, PRN, Swinteck, Aaron Edelman, MD   metoCLOPramide (REGLAN) tablet 5 mg, 5 mg, Oral, Q8H PRN **OR** metoCLOPramide (REGLAN) injection 5 mg, 5 mg, Intravenous, Q8H PRN, Swinteck, Aaron Edelman, MD   multivitamin (RENA-VIT) tablet 1 tablet, 1 tablet, Oral, QHS, Swinteck, Brian, MD, 1 tablet at 10/10/20 2238   naloxone [facility]) injection 0.4 mg, 0.4 mg, Intravenous, PRN, Alfredia Ferguson, Omair Latif, DO, 0.4 mg at 10/10/20 1212   ondansetron (ZOFRAN) tablet 4 mg, 4 mg, Oral, Q6H  PRN **OR** ondansetron (ZOFRAN) injection 4 mg, 4 mg, Intravenous, Q6H PRN, Rod Can, MD, 4 mg at 10/09/20 2239   polyethylene glycol (MIRALAX / GLYCOLAX) packet 17 g, 17 g, Oral, Daily PRN, Swinteck, Aaron Edelman, MD   povidone-iodine 10 % swab 2 application, 2 application, Topical, Once, Swinteck, Aaron Edelman, MD   povidone-iodine 10 % swab 2 application, 2 application, Topical, Once, Swinteck, Aaron Edelman,  MD   pyridOXINE (VITAMIN B-6) tablet 50 mg, 50 mg, Oral, Daily, Swinteck, Aaron Edelman, MD, 50 mg at 10/11/20 0946   rosuvastatin (CRESTOR) tablet 10 mg, 10 mg, Oral, Daily, Swinteck, Aaron Edelman, MD, 10 mg at 10/11/20 0947 Labs CBC    Component Value Date/Time   WBC 11.4 (H) 10/11/2020 0442   RBC 2.95 (L) 10/11/2020 0442   HGB 9.1 (L) 10/11/2020 0442   HCT 27.9 (L) 10/11/2020 0442   PLT 211 10/11/2020 0442   MCV 94.6 10/11/2020 0442   MCH 30.8 10/11/2020 0442   MCHC 32.6 10/11/2020 0442   RDW 16.0 (H) 10/11/2020 0442   LYMPHSABS 0.7 10/11/2020 0442   MONOABS 1.7 (H) 10/11/2020 0442   EOSABS 0.1 10/11/2020 0442   BASOSABS 0.0 10/11/2020 0442    CMP     Component Value Date/Time   NA 133 (L) 10/11/2020 0442   K 3.6 10/11/2020 0442   CL 97 (L) 10/11/2020 0442   CO2 27 10/11/2020 0442   GLUCOSE 195 (H) 10/11/2020 0442   BUN 31 (H) 10/11/2020 0442   BUN 60 (A) 04/30/2020 0000   CREATININE 4.45 (H) 10/11/2020 0442   CREATININE 7.30 (H) 08/27/2020 0000   CALCIUM 8.4 (L) 10/11/2020 0442   PROT 6.0 (L) 10/11/2020 0442   ALBUMIN 2.6 (L) 10/11/2020 0442   AST 18 10/11/2020 0442   ALT 10 10/11/2020 0442   ALKPHOS 35 (L) 10/11/2020 0442   BILITOT 0.8 10/11/2020 0442   GFRNONAA 13 (L) 10/11/2020 0442   GFRAA 12 04/30/2020 0000    glycosylated hemoglobin  Lipid Panel     Component Value Date/Time   CHOL 130 02/15/2020 0000   TRIG 46 02/15/2020 0000   HDL 46 02/15/2020 0000   LDLCALC 73 02/15/2020 0000     Imaging I have reviewed images in epic and the results pertinent to this consultation are: MRI brain pending  Assessment: 80 year old admitted for dizziness after fall resultant right femoral fracture postoperative day 2. Yesterday prior to HD, patient was in normal state of health but upon return from HD he was confused and blood pressure was over 200.  Code stroke was considered but not called due to low suspicion for acute stroke. Suspect his altered mental status was  secondary to dialysis disequilibrium versus toxic metabolic encephalopathy from medications. Patient reports that he was very hungry, had not fed at that time and probably felt that he was hypoglycemic. Today he seems to be back to his baseline.  He did have some asterixis yesterday which has resolved today.  Impression: Toxic metabolic encephalopathy Rule out stroke  Recommendations: MRI brain without contrast-I have personally called MRI again today to expedite.  Yesterday, they were reportedly told that patient is not alert and oriented so MRI has been delayed. Blood pressure goal management to normotension Check B12 TSH-management per primary team if abnormal. Leukocytosis likely reactive in the setting of surgery. Will follow imaging  -- Amie Portland, MD Neurologist [facility] Pager: [phone]   Addendum MRI brain reviewed personally No acute stroke, chronic pontine infarct, chronic white matter disease Recommendations as above Please  call us back if we can be of any further assistance. Plan discussed with Dr. Shelton Silvas.  -- Amie Portland, MD Neurologist [facility] Pager: [phone]

## 2020-10-11 NOTE — Progress Notes (Signed)
PT Cancellation Note  Patient Details Name: Trevor [name] MRN: [id number] DOB: [birth year]   Cancelled Treatment:    Reason Eval/Treat Not Completed: Patient at procedure or test/unavailable.  Pt was unable to be seen, had testing today and was still unavailable when PT attempted.  Follow up as time and pt allow.   Ramond Dial 10/11/2020, 4:05 PM  Mee Hives, PT MS Acute Rehab Dept. Number: [place] [facility] [place]

## 2020-10-11 NOTE — Progress Notes (Signed)
PROGRESS NOTE    Trevor [name]  WJX:[id number] DOB: 07-20-1940 DOA: 10/08/2020 PCP: Celene Squibb, MD   Brief Narrative:  The patient is a 80 year old obese African-American male with a past medical history significant for but not limited to end-stage renal disease on hemodialysis Monday Wednesday Friday, controlled diabetes mellitus type 2 as well as other comorbidities who presented to ED with hip pain and inability ambulate for about a month.  He went to the [facility] to get hemodialysis port replacement for 12 dizzy and fell.  At the [facility] ED he received a knee x-ray which revealed no injury.  After port replacement discharge patient to experience severe right hip pain rendering him unable to walk for about a month.  He saw Dr. Patrick Jupiter Of orthopedic surgery who ordered a hip x-ray which revealed a displaced femoral neck fracture.  Patient was needing surgery and transferred to [facility] for complex anesthesia with dialysis and orthopedic surgery and nephrology was consulted.  Patient is currently going to go for a right hip total arthroplasty from anterior approach and this was done 10/09/20.  Today he went for dialysis and tolerated it well but when he came back he was significantly confused. He was given Narcotics in dialysis. Narcan was given and did not change the patient's Mental Status. Neurology consulted and recommending MRI given his asterixis and poly myoclonus. Ammonia and ABG were also ordered. BP was elevated so was given Hydralazine   MRI Done and showed no acute CVA but did show chronic Pontine Infarct and Chronic White Matter Disease.  Neurology recommending checking a B12 and a TSH and treat if abnormal.  Assessment & Plan:   Principal Problem:   Hip fracture ([place]) Active Problems:   Diabetes mellitus with stage 5 chronic kidney disease (HCC)   Anemia   Gastroesophageal reflux disease   Hyperlipidemia   Neuropathy   End stage renal disease (HCC)   Hyponatremia    Hyperglycemia   Hip pain  Right Closed Displaced Femoral Neck Fracture Acute Hip Pain -Confirmed on imaging on 10/08/20 as an outpatient  -Orthopedic Surgery was consulted and patient to go for Right Total Hip Arthroplasty Anterior Approch  -VTE Prophylaxis per Orthopedic Surgery and they are recommending aspirin, SCDs and teds -WB Status per Ortho and they are recommending weightbearing as tolerated with walker -PT/OT to evaluate and Treat  -Pain Control with Hydrocodone-Acetaminophen 1-2 tab\ q6hprn Severe Pain, and IV Hydromorphone 0.5 mg q3hprn Severe Pain but may need to be more Judicious given Confusion and Reduced Concentration and Twitching  -Bowel Regimen Post-Op but has Miralaz 17 g po Daily PRN   DM  in setting of ESRD  -stable, last A1c was 6.2 -POC glucose was in 300s yesterday -SSI Novolog 0-6 units TID with meals - Insulin detemir 8 units qhs -Continue to Monitor CBG's per Protocol; CBGs ranging from 182-229   ESRD on HD MWF Metabolic Acidosis Hyperphosphatemia -Creatinine 6-7 at baseline.  -Patient's BUN/Cr went from 44/4.44 -> 51/5.05 -> 54/5.53 -.> 31/4.45 -Patient's Phos Level is 4.7 -Patient had a Metabolic Acidosis with a CO2 of 21, AG 12, and a Chloride of 101 -Continiue HDS as scheduled (M, W, F) -Vit D3 1000 units -Nephrology evaluated and he underwen Dialysis today after Surgery and holding Heparin today and minimizing Ultrafiltration; Pateint to go undergo Dialysis again today to get back on Schedule    Hypertension  -Patient took home meds this AM (coreg, clonidine, statin). BP systolic 213Y in ED in  setting of severe pain. Improved with hydralazine 10mg  IV -C/w Carvedilol 6.25 mg bid PO -C/w Hydralazine 50 mg bid PO -C/w Clonidine 0.2 mg TID -C/w PRN Hydralazine   Confusion/Encephalopathy and Polymyoclonus, improved and back to baseline -Likely Medication induced -Patient could not follow commands and would not communicate with the Nurse after  Dialysis -Spoke with Neurology who recommended calling a CODE Stroke initially but then cancelled -Given Narcan and obtained ABG -ABG    Component Value Date/Time   PHART 7.428 10/10/2020 1250   PCO2ART 36.6 10/10/2020 1250   PO2ART 107 10/10/2020 1250   HCO3 23.0 10/10/2020 1250   TCO2 23 11/30/2019 0619   ACIDBASEDEF 0.3 10/10/2020 1250   O2SAT 98.0 10/10/2020 1250  -Was going to get a Stat Head CT but Neuro recommended changing to MRI of Brain; MRI Brain showed "No evidence of recent infarction, hemorrhage, or mass. Chronic microvascular ischemic changes.  Chronic pontine infarct." -Ammonia Level was 21 -Neurology formally consulted and now recommending checking B12 and TSH   HLD -Rosuvastatin 10 mg   Hyponatremia  -In setting of ESRD and volume overload -Na+ 132 on Admission and today was 133 -Continue hemodialysis as scheduled  HLD -C/w Rosuvastatin 10 mg po Daily   Anemia of chronic disease  -Baseline Hgb ~8.5-9. Admission was 9.9 -Hgb/Hct went from 9.9/30.2 -> 10.0/31.0 -> 9.7/30.5 -> 9.1/27.9 -Expect to Drop Post Operatively -Check Anemia Panel in the AM  -Continue HDS as scheduled   Latent TB -C/w Isoniazide 300 mg po Daily and Pyridoxine 50 mg   Obesity -Complicates overall Prognosis and Care -Estimated body mass index is 29.83 kg/m as calculated from the following:   Height as of this encounter: 5\' 11"  (1.803 m).   Weight as of this encounter: 97 kg. -Weight Loss and Dietary Counseling given -Dietary evaluated and recommending 30 mL of Prosource plus 3 times daily as well as renal multivitamin daily  Leukocytosis -Likely in the setting of Surgical Intervention -Patient's WBC went from 7.9 -> 11.7 -> 11.4 -Continue to Monitor and Trend   DVT prophylaxis: Heparin 5,000 units sq q8h Code Status: FULL CODE  Family Communication: No Family present at bedside Disposition Plan: Pending further clearance by Ortho and Nephrology and Evaluation by PT/OT    Status is: Inpatient  Remains inpatient appropriate because:Unsafe d/c plan, IV treatments appropriate due to intensity of illness or inability to take PO, and Inpatient level of care appropriate due to severity of illness  Dispo: The patient is from: Home              Anticipated d/c is to: SNF              Patient currently is not medically stable to d/c.   Difficult to place patient No  Consultants:  Orthopedic Surgery Nephrology  Neurology   Procedures: Right Total Hip Arthroplasty Anterior Approach    Antimicrobials:  Anti-infectives (From admission, onward)    Start     Dose/Rate Route Frequency Ordered Stop   10/10/20 0600  ceFAZolin (ANCEF) IVPB 2g/100 mL premix  Status:  Discontinued        2 g 200 mL/hr over 30 Minutes Intravenous On call to O.R. 10/09/20 2106 10/09/20 2130   10/09/20 2200  ceFAZolin (ANCEF) IVPB 2g/100 mL premix  Status:  Discontinued        2 g 200 mL/hr over 30 Minutes Intravenous Every 6 hours 10/09/20 2109 10/10/20 0036   10/09/20 1000  isoniazid (NYDRAZID) tablet 300 mg  300 mg Oral Daily 10/08/20 1629     10/09/20 0830  ceFAZolin (ANCEF) IVPB 2g/100 mL premix        2 g 200 mL/hr over 30 Minutes Intravenous On call to O.R. 10/09/20 4268 10/09/20 1502        Subjective: Seen and examined and he was more awake and he was at his baseline.  Denies any nausea or vomiting.  Thinks that he was hypoglycemic yesterday he was confused.  Denies any chest pain or shortness of breath.  No other concerns or plans at this time and wanting pain medication before he works with therapy today.  Objective: Vitals:   10/10/20 1735 10/10/20 2004 10/11/20 0940 10/11/20 1259  BP: (!) 164/72 (!) 169/59 (!) 155/67 (!) 149/50  Pulse: 93 90 79 72  Resp: 20 16 17 18   Temp:  99.3 F (37.4 C) 98.7 F (37.1 C)   TempSrc: Oral Oral Oral   SpO2:  98% 97% 100%  Weight:      Height:       No intake or output data in the 24 hours ending 10/11/20  1656  Filed Weights   10/08/20 2300 10/10/20 0740 10/10/20 1123  Weight: 109.8 kg 98.3 kg 97 kg   Examination: Physical Exam:  Constitutional: WN/WD overweight African-American male currently in no acute distress Eyes: Lids and conjunctivae normal, sclerae anicteric  ENMT: External Ears, Nose appear normal. Grossly normal hearing. Neck: Appears normal, supple, no cervical masses, normal ROM, no appreciable thyromegaly; no JVD Respiratory: Diminished to auscultation bilaterally, no wheezing, rales, rhonchi or crackles. Normal respiratory effort and patient is not tachypenic. No accessory muscle use.  On labored breathing Cardiovascular: RRR, no murmurs / rubs / gallops. S1 and S2 auscultated.  Trace edema Abdomen: Soft, non-tender, distended secondary body habitus. Bowel sounds positive.  GU: Deferred. Musculoskeletal: No clubbing / cyanosis of digits/nails. No joint deformity upper and lower extremities.  Has a right chest wall TDC Skin: No rashes, lesions, ulcers on limited skin evaluation. No induration; Warm and dry.  Neurologic: CN 2-12 grossly intact with no focal deficits. Romberg sign and cerebellar reflexes not assessed.  Psychiatric: Normal judgment and insight. Alert and oriented x 3. Normal mood and appropriate affect.   Data Reviewed: I have personally reviewed following labs and imaging studies  CBC: Recent Labs  Lab 10/08/20 1224 10/09/20 0558 10/09/20 1011 10/10/20 0324 10/11/20 0442  WBC 7.9 8.0 7.9 11.7* 11.4*  NEUTROABS 6.3  --  6.3  --  8.9*  HGB 9.9* 9.9* 10.0* 9.7* 9.1*  HCT 31.4* 30.2* 31.0* 30.5* 27.9*  MCV 97.5 93.8 94.5 96.5 94.6  PLT 231 224 247 196 341    Basic Metabolic Panel: Recent Labs  Lab 10/08/20 1224 10/09/20 0558 10/09/20 1011 10/10/20 0324 10/11/20 0442  NA 132* 133*  --  134* 133*  K 3.7 3.7  --  4.5 3.6  CL 97* 99  --  101 97*  CO2 27 23  --  21* 27  GLUCOSE 318* 219*  --  213* 195*  BUN 44* 51*  --  54* 31*  CREATININE  4.44* 5.05*  --  5.53* 4.45*  CALCIUM 8.5* 8.9  --  8.7* 8.4*  MG  --  1.6* 1.8  --  1.7  PHOS  --  4.7* 4.4 5.1* 4.7*    GFR: Estimated Creatinine Clearance: 16 mL/min (A) (by C-G formula based on SCr of 4.45 mg/dL (H)). Liver Function Tests: Recent Labs  Lab 10/09/20 [phone]  10/10/20 0324 10/11/20 0442  AST  --   --  18  ALT  --   --  10  ALKPHOS  --   --  35*  BILITOT  --   --  0.8  PROT  --   --  6.0*  ALBUMIN 2.8* 3.0* 2.6*    No results for input(s): LIPASE, AMYLASE in the last 168 hours. Recent Labs  Lab 10/10/20 1236  AMMONIA 21    Coagulation Profile: Recent Labs  Lab 10/08/20 1224  INR 1.1    Cardiac Enzymes: No results for input(s): CKTOTAL, CKMB, CKMBINDEX, TROPONINI in the last 168 hours. BNP (last 3 results) No results for input(s): PROBNP in the last 8760 hours. HbA1C: No results for input(s): HGBA1C in the last 72 hours.  CBG: Recent Labs  Lab 10/10/20 1603 10/10/20 2234 10/11/20 0304 10/11/20 0627 10/11/20 1243  GLUCAP 205* 229* 182* 197* 213*    Lipid Profile: No results for input(s): CHOL, HDL, LDLCALC, TRIG, CHOLHDL, LDLDIRECT in the last 72 hours. Thyroid Function Tests: No results for input(s): TSH, T4TOTAL, FREET4, T3FREE, THYROIDAB in the last 72 hours. Anemia Panel: No results for input(s): VITAMINB12, FOLATE, FERRITIN, TIBC, IRON, RETICCTPCT in the last 72 hours. Sepsis Labs: No results for input(s): PROCALCITON, LATICACIDVEN in the last 168 hours.  Recent Results (from the past 240 hour(s))  Resp Panel by RT-PCR (Flu A&B, Covid) Nasopharyngeal Swab     Status: None   Collection Time: 10/08/20  1:44 PM   Specimen: Nasopharyngeal Swab; Nasopharyngeal(NP) swabs in vial transport medium  Result Value Ref Range Status   SARS Coronavirus 2 by RT PCR NEGATIVE NEGATIVE Final    Comment: (NOTE) SARS-CoV-2 target nucleic acids are NOT DETECTED.  The SARS-CoV-2 RNA is generally detectable in upper respiratory specimens during the  acute phase of infection. The lowest concentration of SARS-CoV-2 viral copies this assay can detect is 138 copies/mL. A negative result does not preclude SARS-Cov-2 infection and should not be used as the sole basis for treatment or other patient management decisions. A negative result may occur with  improper specimen collection/handling, submission of specimen other than nasopharyngeal swab, presence of viral mutation(s) within the areas targeted by this assay, and inadequate number of viral copies(<138 copies/mL). A negative result must be combined with clinical observations, patient history, and epidemiological information. The expected result is Negative.  Fact Sheet for Patients:  EntrepreneurPulse.com.au  Fact Sheet for Healthcare Providers:  IncredibleEmployment.be  This test is no t yet approved or cleared by the [place] FDA and  has been authorized for detection and/or diagnosis of SARS-CoV-2 by FDA under an Emergency Use Authorization (EUA). This EUA will remain  in effect (meaning this test can be used) for the duration of the COVID-19 declaration under Section 564(b)(1) of the Act, 21 U.S.C.section 360bbb-3(b)(1), unless the authorization is terminated  or revoked sooner.       Influenza A by PCR NEGATIVE NEGATIVE Final   Influenza B by PCR NEGATIVE NEGATIVE Final    Comment: (NOTE) The Xpert Xpress SARS-CoV-2/FLU/RSV plus assay is intended as an aid in the diagnosis of influenza from Nasopharyngeal swab specimens and should not be used as a sole basis for treatment. Nasal washings and aspirates are unacceptable for Xpert Xpress SARS-CoV-2/FLU/RSV testing.  Fact Sheet for Patients: EntrepreneurPulse.com.au  Fact Sheet for Healthcare Providers: IncredibleEmployment.be  This test is not yet approved or cleared by the [place] FDA and has been authorized for detection and/or  diagnosis of SARS-CoV-2  by FDA under an Emergency Use Authorization (EUA). This EUA will remain in effect (meaning this test can be used) for the duration of the COVID-19 declaration under Section 564(b)(1) of the Act, 21 U.S.C. section 360bbb-3(b)(1), unless the authorization is terminated or revoked.  Performed at [facility], [address], [place], [place] [address]   Surgical pcr screen     Status: None   Collection Time: 10/09/20  3:58 AM   Specimen: Nasal Mucosa; Nasal Swab  Result Value Ref Range Status   MRSA, PCR NEGATIVE NEGATIVE Final   Staphylococcus aureus NEGATIVE NEGATIVE Final    Comment: (NOTE) The Xpert SA Assay (FDA approved for NASAL specimens in patients 27 years of age and older), is one component of a comprehensive surveillance program. It is not intended to diagnose infection nor to guide or monitor treatment. Performed at [facility], [place] [address], [place], [place] [address]       RN Pressure Injury Documentation:     Estimated body mass index is 29.83 kg/m as calculated from the following:   Height as of this encounter: 5\' 11"  (1.803 m).   Weight as of this encounter: 97 kg.  Malnutrition Type:  Malnutrition Characteristics: Signs/Symptoms: estimated needs Nutrition Interventions: Interventions: MVI, Prostat   Radiology Studies: MR BRAIN WO CONTRAST  Result Date: 10/11/2020 CLINICAL DATA:  Mental status change EXAM: MRI HEAD WITHOUT CONTRAST TECHNIQUE: Multiplanar, multiecho pulse sequences of the brain and surrounding structures were obtained without intravenous contrast. COMPARISON:  None. FINDINGS: Motion artifact is present. Brain: There is no acute infarction or intracranial hemorrhage. There is no intracranial mass, mass effect, or edema. There is no hydrocephalus or extra-axial fluid collection. Prominence of the ventricles and sulci reflects generalized parenchymal volume loss. Patchy and confluent areas of T2 hyperintensity in  the supratentorial and pontine white matter nonspecific but may reflect mild to moderate chronic microvascular ischemic changes. There is a chronic infarct of the left parasagittal pons. Vascular: Major vessel flow voids at the skull base are preserved. Skull and upper cervical spine: Normal marrow signal is preserved. Sinuses/Orbits: Paranasal sinuses are aerated. Orbits are unremarkable. Other: Sella is unremarkable.  Mastoid air cells are clear. IMPRESSION: No evidence of recent infarction, hemorrhage, or mass. Chronic microvascular ischemic changes.  Chronic pontine infarct. Electronically Signed   By: Macy Mis M.D.   On: 10/11/2020 13:29   Pelvis Portable  Result Date: 10/09/2020 CLINICAL DATA:  Postop EXAM: PORTABLE PELVIS 1-2 VIEWS COMPARISON:  10/09/2020, 10/08/2020 FINDINGS: Interval right hip replacement with intact hardware and normal alignment. Gas in the soft tissues consistent with recent surgery. IMPRESSION: Interval right hip replacement with expected postsurgical changes Electronically Signed   By: Donavan Foil M.D.   On: 10/09/2020 19:53    Scheduled Meds:  (feeding supplement) PROSource Plus  30 mL Oral TID BM   aspirin EC  325 mg Oral Q breakfast   carvedilol  6.25 mg Oral BID WC   chlorhexidine  60 mL Topical Once   Chlorhexidine Gluconate Cloth  6 each Topical Q0600   cholecalciferol  1,000 Units Oral Daily   cloNIDine  0.2 mg Oral TID   docusate sodium  100 mg Oral BID   hydrALAZINE  50 mg Oral BID   insulin aspart  0-6 Units Subcutaneous TID WC   insulin detemir  8 Units Subcutaneous QHS   isoniazid  300 mg Oral Daily   multivitamin  1 tablet Oral QHS   povidone-iodine  2 application Topical  Once   povidone-iodine  2 application Topical Once   pyridOXINE  50 mg Oral Daily   rosuvastatin  10 mg Oral Daily   Continuous Infusions:    LOS: 3 days   Kerney Elbe, DO [facility] PAGER is on [place]  If 7PM-7AM, please contact  night-coverage [email or web address]

## 2020-10-12 LAB — CBC WITH DIFFERENTIAL/PLATELET
Abs Immature Granulocytes: 0.04 10*3/uL (ref 0.00–0.07)
Basophils Absolute: 0 10*3/uL (ref 0.0–0.1)
Basophils Relative: 0 %
Eosinophils Absolute: 0.1 10*3/uL (ref 0.0–0.5)
Eosinophils Relative: 1 %
HCT: 26 % — ABNORMAL LOW (ref 39.0–52.0)
Hemoglobin: 8.4 g/dL — ABNORMAL LOW (ref 13.0–17.0)
Immature Granulocytes: 0 %
Lymphocytes Relative: 8 %
Lymphs Abs: 0.8 10*3/uL (ref 0.7–4.0)
MCH: 30.7 pg (ref 26.0–34.0)
MCHC: 32.3 g/dL (ref 30.0–36.0)
MCV: 94.9 fL (ref 80.0–100.0)
Monocytes Absolute: 1.3 10*3/uL — ABNORMAL HIGH (ref 0.1–1.0)
Monocytes Relative: 12 %
Neutro Abs: 8.1 10*3/uL — ABNORMAL HIGH (ref 1.7–7.7)
Neutrophils Relative %: 79 %
Platelets: 201 10*3/uL (ref 150–400)
RBC: 2.74 MIL/uL — ABNORMAL LOW (ref 4.22–5.81)
RDW: 15.6 % — ABNORMAL HIGH (ref 11.5–15.5)
WBC: 10.4 10*3/uL (ref 4.0–10.5)
nRBC: 0 % (ref 0.0–0.2)

## 2020-10-12 LAB — GLUCOSE, CAPILLARY
Glucose-Capillary: 136 mg/dL — ABNORMAL HIGH (ref 70–99)
Glucose-Capillary: 175 mg/dL — ABNORMAL HIGH (ref 70–99)
Glucose-Capillary: 200 mg/dL — ABNORMAL HIGH (ref 70–99)
Glucose-Capillary: 226 mg/dL — ABNORMAL HIGH (ref 70–99)
Glucose-Capillary: 283 mg/dL — ABNORMAL HIGH (ref 70–99)

## 2020-10-12 LAB — COMPREHENSIVE METABOLIC PANEL
ALT: 9 U/L (ref 0–44)
AST: 18 U/L (ref 15–41)
Albumin: 2.3 g/dL — ABNORMAL LOW (ref 3.5–5.0)
Alkaline Phosphatase: 32 U/L — ABNORMAL LOW (ref 38–126)
Anion gap: 8 (ref 5–15)
BUN: 20 mg/dL (ref 8–23)
CO2: 27 mmol/L (ref 22–32)
Calcium: 8 mg/dL — ABNORMAL LOW (ref 8.9–10.3)
Chloride: 96 mmol/L — ABNORMAL LOW (ref 98–111)
Creatinine, Ser: 3.24 mg/dL — ABNORMAL HIGH (ref 0.61–1.24)
GFR, Estimated: 19 mL/min — ABNORMAL LOW (ref >60)
Glucose, Bld: 202 mg/dL — ABNORMAL HIGH (ref 70–99)
Potassium: 3.3 mmol/L — ABNORMAL LOW (ref 3.5–5.1)
Sodium: 131 mmol/L — ABNORMAL LOW (ref 135–145)
Total Bilirubin: 0.7 mg/dL (ref 0.3–1.2)
Total Protein: 5.4 g/dL — ABNORMAL LOW (ref 6.5–8.1)

## 2020-10-12 LAB — MAGNESIUM: Magnesium: 1.7 mg/dL (ref 1.7–2.4)

## 2020-10-12 LAB — TSH: TSH: 2.373 u[IU]/mL (ref 0.350–4.500)

## 2020-10-12 LAB — VITAMIN B12: Vitamin B-12: 229 pg/mL (ref 180–914)

## 2020-10-12 LAB — PHOSPHORUS: Phosphorus: 3.5 mg/dL (ref 2.5–4.6)

## 2020-10-12 MED ORDER — POTASSIUM CHLORIDE CRYS ER 20 MEQ PO TBCR
20.0000 meq | EXTENDED_RELEASE_TABLET | Freq: Once | ORAL | Status: AC
  Administered 2020-10-12: 20 meq via ORAL
  Filled 2020-10-12: qty 1

## 2020-10-12 NOTE — Progress Notes (Signed)
[facility] NEPHROLOGY PROGRESS NOTE  Assessment/ Plan: Pt is a 80 y.o. yo male  with history of hypertension, DM, HLD, nephrolithiasis, ESRD on HD MWF at [facility] followed by Dr. Theador Hawthorne, transferred from [facility] for displaced femoral neck fracture, seen as a consultation for the management of ESRD.  #Right femoral neck fracture: Seen by orthopedics and underwent right total hip arthroplasty on 6/29.  PT OT and rehab per primary and orthopedics team.    # ESRD: MWF at [facility].  Dialysis yesterday with 1 L ultrafiltration, tolerated well without any issues.  Plan for next dialysis on 7/4.  Right IJ TDC for the access.   # Hypertension: On antihypertensive.  Volume management by ultrafiltration.   # Anemia of ESRD: Hemoglobin stable.  Need outpatient record about ESA treatment.  Monitor hemoglobin.   # Metabolic Bone Disease: Calcium and phosphorus level at goal.  #Altered mental status: Seen by neurology.  MRI brain with chronic microvascular ischemic changes and chronic pontine infarct without evidence of acute finding.   He looks alert awake and around his baseline.  #Hyponatremia/hypokalemia: Managed by dialysis.   Subjective: Seen and examined at bedside.  He had dialysis yesterday and tolerated well.  He is alert awake and mental status is around his baseline.  Denies nausea vomiting chest pain shortness of breath.  Objective Vital signs in last 24 hours: Vitals:   10/11/20 2052 10/11/20 2122 10/11/20 2335 10/12/20 0848  BP: 139/69 (!) 136/52 (!) 159/66 (!) 151/66  Pulse:   78 71  Resp: 16 15 18 17   Temp:   99.1 F (37.3 C) 98.7 F (37.1 C)  TempSrc:   Oral Oral  SpO2:   100% 100%  Weight:      Height:       Weight change:  No intake or output data in the 24 hours ending 10/12/20 0941      Labs: Basic Metabolic Panel: Recent Labs  Lab 10/10/20 0324 10/11/20 0442 10/12/20 0200  NA 134* 133* 131*  K 4.5 3.6 3.3*  CL  101 97* 96*  CO2 21* 27 27  GLUCOSE 213* 195* 202*  BUN 54* 31* 20  CREATININE 5.53* 4.45* 3.24*  CALCIUM 8.7* 8.4* 8.0*  PHOS 5.1* 4.7* 3.5    Liver Function Tests: Recent Labs  Lab 10/10/20 0324 10/11/20 0442 10/12/20 0200  AST  --  18 18  ALT  --  10 9  ALKPHOS  --  35* 32*  BILITOT  --  0.8 0.7  PROT  --  6.0* 5.4*  ALBUMIN 3.0* 2.6* 2.3*    No results for input(s): LIPASE, AMYLASE in the last 168 hours. Recent Labs  Lab 10/10/20 1236  AMMONIA 21    CBC: Recent Labs  Lab 10/09/20 0558 10/09/20 1011 10/10/20 0324 10/11/20 0442 10/12/20 0200  WBC 8.0 7.9 11.7* 11.4* 10.4  NEUTROABS  --  6.3  --  8.9* 8.1*  HGB 9.9* 10.0* 9.7* 9.1* 8.4*  HCT 30.2* 31.0* 30.5* 27.9* 26.0*  MCV 93.8 94.5 96.5 94.6 94.9  PLT 224 247 196 211 201    Cardiac Enzymes: No results for input(s): CKTOTAL, CKMB, CKMBINDEX, TROPONINI in the last 168 hours. CBG: Recent Labs  Lab 10/11/20 0627 10/11/20 1243 10/11/20 1714 10/11/20 2341 10/12/20 0643  GLUCAP 197* 213* 227* 136* 175*     Iron Studies: No results for input(s): IRON, TIBC, TRANSFERRIN, FERRITIN in the last 72 hours. Studies/Results: MR BRAIN WO CONTRAST  Result Date:  10/11/2020 CLINICAL DATA:  Mental status change EXAM: MRI HEAD WITHOUT CONTRAST TECHNIQUE: Multiplanar, multiecho pulse sequences of the brain and surrounding structures were obtained without intravenous contrast. COMPARISON:  None. FINDINGS: Motion artifact is present. Brain: There is no acute infarction or intracranial hemorrhage. There is no intracranial mass, mass effect, or edema. There is no hydrocephalus or extra-axial fluid collection. Prominence of the ventricles and sulci reflects generalized parenchymal volume loss. Patchy and confluent areas of T2 hyperintensity in the supratentorial and pontine white matter nonspecific but may reflect mild to moderate chronic microvascular ischemic changes. There is a chronic infarct of the left parasagittal pons.  Vascular: Major vessel flow voids at the skull base are preserved. Skull and upper cervical spine: Normal marrow signal is preserved. Sinuses/Orbits: Paranasal sinuses are aerated. Orbits are unremarkable. Other: Sella is unremarkable.  Mastoid air cells are clear. IMPRESSION: No evidence of recent infarction, hemorrhage, or mass. Chronic microvascular ischemic changes.  Chronic pontine infarct. Electronically Signed   By: Macy Mis M.D.   On: 10/11/2020 13:29    Medications: Infusions:   Scheduled Medications:  (feeding supplement) PROSource Plus  30 mL Oral TID BM   aspirin EC  325 mg Oral Q breakfast   carvedilol  6.25 mg Oral BID WC   chlorhexidine  60 mL Topical Once   Chlorhexidine Gluconate Cloth  6 each Topical Q0600   cholecalciferol  1,000 Units Oral Daily   cloNIDine  0.2 mg Oral TID   docusate sodium  100 mg Oral BID   hydrALAZINE  50 mg Oral BID   insulin aspart  0-6 Units Subcutaneous TID WC   insulin detemir  8 Units Subcutaneous QHS   isoniazid  300 mg Oral Daily   multivitamin  1 tablet Oral QHS   potassium chloride  20 mEq Oral Once   povidone-iodine  2 application Topical Once   povidone-iodine  2 application Topical Once   pyridOXINE  50 mg Oral Daily   rosuvastatin  10 mg Oral Daily    have reviewed scheduled and prn medications.  Physical Exam: General: Lying on bed comfortable, not in distress Heart:RRR, s1s2 nl Lungs: Clear b/l, no crackle Abdomen:soft, Non-tender, non-distended Extremities:No LE edema Dialysis Access: Right IJ TDC.  Alvan Culpepper Prasad Burrel Legrand 10/12/2020,9:41 AM  LOS: 4 days

## 2020-10-12 NOTE — Progress Notes (Addendum)
Patient is back to the unit from dialysis. Patient is alert and oriented. No confusion noted.Denies any pain at this time. Will continue to monitor.

## 2020-10-12 NOTE — Progress Notes (Signed)
PROGRESS NOTE    Trevor [name]  JKK:[id number] DOB: 01-31-1941 DOA: 10/08/2020 PCP: Celene Squibb, MD   Brief Narrative:  The patient is a 80 year old obese African-American male with a past medical history significant for but not limited to end-stage renal disease on hemodialysis Monday Wednesday Friday, controlled diabetes mellitus type 2 as well as other comorbidities who presented to ED with hip pain and inability ambulate for about a month.  He went to the [facility] to get hemodialysis port replacement for 12 dizzy and fell.  At the [facility] ED he received a knee x-ray which revealed no injury.  After port replacement discharge patient to experience severe right hip pain rendering him unable to walk for about a month.  He saw Dr. Patrick Jupiter Of orthopedic surgery who ordered a hip x-ray which revealed a displaced femoral neck fracture.  Patient was needing surgery and transferred to [facility] for complex anesthesia with dialysis and orthopedic surgery and nephrology was consulted.  Patient is currently going to go for a right hip total arthroplasty from anterior approach and this was done 10/09/20.  Today he went for dialysis and tolerated it well but when he came back he was significantly confused. He was given Narcotics in dialysis. Narcan was given and did not change the patient's Mental Status. Neurology consulted and recommending MRI given his asterixis and poly myoclonus. Ammonia and ABG were also ordered. BP was elevated so was given Hydralazine   MRI Done and showed no acute CVA but did show chronic Pontine Infarct and Chronic White Matter Disease.  Neurology recommending checking a B12 and a TSH and treat if abnormal but both were normal. Social Work assisting with disposition and placement.   Assessment & Plan:   Principal Problem:   Hip fracture ([place]) Active Problems:   Diabetes mellitus with stage 5 chronic kidney disease (HCC)   Anemia   Gastroesophageal reflux disease    Hyperlipidemia   Neuropathy   End stage renal disease (HCC)   Hyponatremia   Hyperglycemia   Hip pain  Right Closed Displaced Femoral Neck Fracture Acute Hip Pain -Confirmed on imaging on 10/08/20 as an outpatient  -Orthopedic Surgery was consulted and patient to go for Right Total Hip Arthroplasty Anterior Approch  -VTE Prophylaxis per Orthopedic Surgery and they are recommending aspirin, SCDs and teds -WB Status per Ortho and they are recommending weightbearing as tolerated with walker -PT/OT to evaluate and Treat  -Pain Control with Hydrocodone-Acetaminophen 1-2 tab\ q6hprn Severe Pain, and IV Hydromorphone 0.5 mg q3hprn Severe Pain but may need to be more Judicious given Confusion and Reduced Concentration and Twitching  -Bowel Regimen Post-Op but has Miralax 17 g po Daily PRN -Ortho Recommending Advancing Diet and Up with Therapy    DM  in setting of ESRD  -stable, last A1c was 6.2 -POC glucose was in 300s yesterday -SSI Novolog 0-6 units TID with meals - Insulin detemir 8 units qhs -Continue to Monitor CBG's per Protocol; CBGs ranging from 136-227   ESRD on HD MWF Metabolic Acidosis Hyperphosphatemia -Creatinine 6-7 at baseline.  -Patient's BUN/Cr went from 44/4.44 -> 51/5.05 -> 54/5.53 -> 31/4.45 -> 20/3.24 -Patient's Phos Level was 4.7 and is now 3.5 -Patient had a Metabolic Acidosis with a CO2 of 21, AG 12, and a Chloride of 101 -Continiue HDS as scheduled (M, W, F) -Vit D3 1000 units -Nephrology evaluated and he underwent twice (Last session yesterdy with 1 Liter Ultrafiltration) with next session scheduled for 10/14/20  Hypokalemia -Mild at 3.3 -Replete with po KCL 20 mEQ x1 -Further management per Dialysis session -Continue to Monitor and Replete as Necessary -Repeat CMP in the AM    Hypertension  -Patient took home meds this AM (coreg, clonidine, statin). BP systolic 250N in ED in setting of severe pain. Improved with hydralazine 10mg  IV -C/w Carvedilol 6.25 mg  bid PO -C/w Hydralazine 50 mg bid PO -C/w Clonidine 0.2 mg TID -C/w PRN Hydralazine  -Continue to Monitor BP per Protocols; Last BP was 151/66  Confusion/Encephalopathy and Polymyoclonus, improved and back to baseline -Likely Medication induced -Patient could not follow commands and would not communicate with the Nurse after Dialysis -Spoke with Neurology who recommended calling a CODE Stroke initially but then cancelled -Given Narcan and obtained ABG -ABG    Component Value Date/Time   PHART 7.428 10/10/2020 1250   PCO2ART 36.6 10/10/2020 1250   PO2ART 107 10/10/2020 1250   HCO3 23.0 10/10/2020 1250   TCO2 23 11/30/2019 0619   ACIDBASEDEF 0.3 10/10/2020 1250   O2SAT 98.0 10/10/2020 1250  -Was going to get a Stat Head CT but Neuro recommended changing to MRI of Brain; MRI Brain showed "No evidence of recent infarction, hemorrhage, or mass. Chronic microvascular ischemic changes.  Chronic pontine infarct." -Ammonia Level was 21 -Neurology formally consulted and now recommending checking B12 and TSH both of which were normal. -Patient's Mental Status has improved to Baseline   HLD -Rosuvastatin 10 mg   Hyponatremia  -In setting of ESRD and volume overload -Na+ 132 on Admission and today was 131 -Continue hemodialysis as scheduled and likley to be correct in Dialysis -Repeat CMP in the AM   HLD -C/w Rosuvastatin 10 mg po Daily   Anemia of chronic disease  -Baseline Hgb ~8.5-9. Admission was 9.9 -Hgb/Hct went from 9.9/30.2 -> 10.0/31.0 -> 9.7/30.5 -> 9.1/27.9 -> 8.4/26.0 -Expect to Drop Post Operatively -Check Anemia Panel in the AM  -Continue HDS as scheduled   Latent TB -C/w Isoniazide 300 mg po Daily and Pyridoxine 50 mg   Obesity -Complicates overall Prognosis and Care -Estimated body mass index is 29.83 kg/m as calculated from the following:   Height as of this encounter: 5\' 11"  (1.803 m).   Weight as of this encounter: 97 kg. -Weight Loss and Dietary Counseling  given -Dietary evaluated and recommending 30 mL of Prosource plus 3 times daily as well as renal multivitamin daily  Leukocytosis -Likely in the setting of Surgical Intervention -Patient's WBC went from 7.9 -> 11.7 -> 11.4 -> 10.4 -Continue to Monitor and Trend   DVT prophylaxis: Heparin 5,000 units sq q8h Code Status: FULL CODE  Family Communication: No Family present at bedside Disposition Plan: Pending further clearance by Ortho and Nephrology and Evaluation by PT/OT   Status is: Inpatient  Remains inpatient appropriate because:Unsafe d/c plan, IV treatments appropriate due to intensity of illness or inability to take PO, and Inpatient level of care appropriate due to severity of illness  Dispo: The patient is from: Home              Anticipated d/c is to: SNF              Patient currently is not medically stable to d/c.   Difficult to place patient No  Consultants:  Orthopedic Surgery Nephrology  Neurology   Procedures: Right Total Hip Arthroplasty Anterior Approach    Antimicrobials:  Anti-infectives (From admission, onward)    Start     Dose/Rate Route Frequency Ordered  Stop   10/10/20 0600  ceFAZolin (ANCEF) IVPB 2g/100 mL premix  Status:  Discontinued        2 g 200 mL/hr over 30 Minutes Intravenous On call to O.R. 10/09/20 2106 10/09/20 2130   10/09/20 2200  ceFAZolin (ANCEF) IVPB 2g/100 mL premix  Status:  Discontinued        2 g 200 mL/hr over 30 Minutes Intravenous Every 6 hours 10/09/20 2109 10/10/20 0036   10/09/20 1000  isoniazid (NYDRAZID) tablet 300 mg        300 mg Oral Daily 10/08/20 1629     10/09/20 0830  ceFAZolin (ANCEF) IVPB 2g/100 mL premix        2 g 200 mL/hr over 30 Minutes Intravenous On call to O.R. 10/09/20 0743 10/09/20 1502        Subjective: Seen and examined and he is feeling a little depressed today as he is alone and not with his family.  Wanting to go to the [facility] however they have a [place] outbreak and now considering  other skilled nursing facilities.  No chest pain, shortness of breath, lightheadedness or dizziness.  No other concerns or complaints at this time.  Objective: Vitals:   10/11/20 2052 10/11/20 2122 10/11/20 2335 10/12/20 0848  BP: 139/69 (!) 136/52 (!) 159/66 (!) 151/66  Pulse:   78 71  Resp: 16 15 18 17   Temp:   99.1 F (37.3 C) 98.7 F (37.1 C)  TempSrc:   Oral Oral  SpO2:   100% 100%  Weight:      Height:       No intake or output data in the 24 hours ending 10/12/20 1213  Filed Weights   10/08/20 2300 10/10/20 0740 10/10/20 1123  Weight: 109.8 kg 98.3 kg 97 kg   Examination: Physical Exam:  Constitutional: WN/WD overweight African-American male currently in no acute distress appears a little frustrated and mildly depressed Eyes: Lids and conjunctivae normal, sclerae anicteric  ENMT: External Ears, Nose appear normal. Grossly normal hearing. Neck: Appears normal, supple, no cervical masses, normal ROM, no appreciable thyromegaly; no JVD Respiratory: Diminished to auscultation bilaterally, no wheezing, rales, rhonchi or crackles. Normal respiratory effort and patient is not tachypenic. No accessory muscle use.  Unlabored breathing Cardiovascular: RRR, no murmurs / rubs / gallops. S1 and S2 auscultated.  Trace extremity edema Abdomen: Soft, non-tender, distended secondary body habitus. Bowel sounds positive.  GU: Deferred. Musculoskeletal: No clubbing / cyanosis of digits/nails. No joint deformity upper and lower extremities. Has right chest wall TDC Skin: No rashes, lesions, ulcers on a limited skin evaluation. No induration; Warm and dry.  Neurologic: CN 2-12 grossly intact with no focal deficits. Romberg sign cerebellar reflexes not assessed.  Psychiatric: Normal judgment and insight. Alert and oriented x 3. Depressed appearing mood and appropriate affect.   Data Reviewed: I have personally reviewed following labs and imaging studies  CBC: Recent Labs  Lab 10/08/20 1224  10/09/20 0558 10/09/20 1011 10/10/20 0324 10/11/20 0442 10/12/20 0200  WBC 7.9 8.0 7.9 11.7* 11.4* 10.4  NEUTROABS 6.3  --  6.3  --  8.9* 8.1*  HGB 9.9* 9.9* 10.0* 9.7* 9.1* 8.4*  HCT 31.4* 30.2* 31.0* 30.5* 27.9* 26.0*  MCV 97.5 93.8 94.5 96.5 94.6 94.9  PLT 231 224 247 196 211 094    Basic Metabolic Panel: Recent Labs  Lab 10/08/20 1224 10/09/20 0558 10/09/20 1011 10/10/20 0324 10/11/20 0442 10/12/20 0200  NA 132* 133*  --  134* 133* 131*  K 3.7 3.7  --  4.5 3.6 3.3*  CL 97* 99  --  101 97* 96*  CO2 27 23  --  21* 27 27  GLUCOSE 318* 219*  --  213* 195* 202*  BUN 44* 51*  --  54* 31* 20  CREATININE 4.44* 5.05*  --  5.53* 4.45* 3.24*  CALCIUM 8.5* 8.9  --  8.7* 8.4* 8.0*  MG  --  1.6* 1.8  --  1.7 1.7  PHOS  --  4.7* 4.4 5.1* 4.7* 3.5    GFR: Estimated Creatinine Clearance: 22 mL/min (A) (by C-G formula based on SCr of 3.24 mg/dL (H)). Liver Function Tests: Recent Labs  Lab 10/09/20 0558 10/10/20 0324 10/11/20 0442 10/12/20 0200  AST  --   --  18 18  ALT  --   --  10 9  ALKPHOS  --   --  35* 32*  BILITOT  --   --  0.8 0.7  PROT  --   --  6.0* 5.4*  ALBUMIN 2.8* 3.0* 2.6* 2.3*    No results for input(s): LIPASE, AMYLASE in the last 168 hours. Recent Labs  Lab 10/10/20 1236  AMMONIA 21    Coagulation Profile: Recent Labs  Lab 10/08/20 1224  INR 1.1    Cardiac Enzymes: No results for input(s): CKTOTAL, CKMB, CKMBINDEX, TROPONINI in the last 168 hours. BNP (last 3 results) No results for input(s): PROBNP in the last 8760 hours. HbA1C: No results for input(s): HGBA1C in the last 72 hours.  CBG: Recent Labs  Lab 10/11/20 0627 10/11/20 1243 10/11/20 1714 10/11/20 2341 10/12/20 0643  GLUCAP 197* 213* 227* 136* 175*    Lipid Profile: No results for input(s): CHOL, HDL, LDLCALC, TRIG, CHOLHDL, LDLDIRECT in the last 72 hours. Thyroid Function Tests: Recent Labs    10/12/20 0200  TSH 2.373   Anemia Panel: Recent Labs    10/12/20 0200   VITAMINB12 229   Sepsis Labs: No results for input(s): PROCALCITON, LATICACIDVEN in the last 168 hours.  Recent Results (from the past 240 hour(s))  Resp Panel by RT-PCR (Flu A&B, Covid) Nasopharyngeal Swab     Status: None   Collection Time: 10/08/20  1:44 PM   Specimen: Nasopharyngeal Swab; Nasopharyngeal(NP) swabs in vial transport medium  Result Value Ref Range Status   SARS Coronavirus 2 by RT PCR NEGATIVE NEGATIVE Final    Comment: (NOTE) SARS-CoV-2 target nucleic acids are NOT DETECTED.  The SARS-CoV-2 RNA is generally detectable in upper respiratory specimens during the acute phase of infection. The lowest concentration of SARS-CoV-2 viral copies this assay can detect is 138 copies/mL. A negative result does not preclude SARS-Cov-2 infection and should not be used as the sole basis for treatment or other patient management decisions. A negative result may occur with  improper specimen collection/handling, submission of specimen other than nasopharyngeal swab, presence of viral mutation(s) within the areas targeted by this assay, and inadequate number of viral copies(<138 copies/mL). A negative result must be combined with clinical observations, patient history, and epidemiological information. The expected result is Negative.  Fact Sheet for Patients:  EntrepreneurPulse.com.au  Fact Sheet for Healthcare Providers:  IncredibleEmployment.be  This test is no t yet approved or cleared by the [place] FDA and  has been authorized for detection and/or diagnosis of SARS-CoV-2 by FDA under an Emergency Use Authorization (EUA). This EUA will remain  in effect (meaning this test can be used) for the duration of the COVID-19 declaration under Section  564(b)(1) of the Act, 21 U.S.C.section 360bbb-3(b)(1), unless the authorization is terminated  or revoked sooner.       Influenza A by PCR NEGATIVE NEGATIVE Final   Influenza B by PCR  NEGATIVE NEGATIVE Final    Comment: (NOTE) The Xpert Xpress SARS-CoV-2/FLU/RSV plus assay is intended as an aid in the diagnosis of influenza from Nasopharyngeal swab specimens and should not be used as a sole basis for treatment. Nasal washings and aspirates are unacceptable for Xpert Xpress SARS-CoV-2/FLU/RSV testing.  Fact Sheet for Patients: EntrepreneurPulse.com.au  Fact Sheet for Healthcare Providers: IncredibleEmployment.be  This test is not yet approved or cleared by the [place] FDA and has been authorized for detection and/or diagnosis of SARS-CoV-2 by FDA under an Emergency Use Authorization (EUA). This EUA will remain in effect (meaning this test can be used) for the duration of the COVID-19 declaration under Section 564(b)(1) of the Act, 21 U.S.C. section 360bbb-3(b)(1), unless the authorization is terminated or revoked.  Performed at [facility], [address] [place], [place] [address]   Surgical pcr screen     Status: None   Collection Time: 10/09/20  3:58 AM   Specimen: Nasal Mucosa; Nasal Swab  Result Value Ref Range Status   MRSA, PCR NEGATIVE NEGATIVE Final   Staphylococcus aureus NEGATIVE NEGATIVE Final    Comment: (NOTE) The Xpert SA Assay (FDA approved for NASAL specimens in patients 74 years of age and older), is one component of a comprehensive surveillance program. It is not intended to diagnose infection nor to guide or monitor treatment. Performed at [facility], [place] [address], [place],  [address]       RN Pressure Injury Documentation:     Estimated body mass index is 29.83 kg/m as calculated from the following:   Height as of this encounter: 5\' 11"  (1.803 m).   Weight as of this encounter: 97 kg.  Malnutrition Type:  Malnutrition Characteristics: Signs/Symptoms: estimated needs Nutrition Interventions: Interventions: MVI, Prostat   Radiology Studies: MR BRAIN WO  CONTRAST  Result Date: 10/11/2020 CLINICAL DATA:  Mental status change EXAM: MRI HEAD WITHOUT CONTRAST TECHNIQUE: Multiplanar, multiecho pulse sequences of the brain and surrounding structures were obtained without intravenous contrast. COMPARISON:  None. FINDINGS: Motion artifact is present. Brain: There is no acute infarction or intracranial hemorrhage. There is no intracranial mass, mass effect, or edema. There is no hydrocephalus or extra-axial fluid collection. Prominence of the ventricles and sulci reflects generalized parenchymal volume loss. Patchy and confluent areas of T2 hyperintensity in the supratentorial and pontine white matter nonspecific but may reflect mild to moderate chronic microvascular ischemic changes. There is a chronic infarct of the left parasagittal pons. Vascular: Major vessel flow voids at the skull base are preserved. Skull and upper cervical spine: Normal marrow signal is preserved. Sinuses/Orbits: Paranasal sinuses are aerated. Orbits are unremarkable. Other: Sella is unremarkable.  Mastoid air cells are clear. IMPRESSION: No evidence of recent infarction, hemorrhage, or mass. Chronic microvascular ischemic changes.  Chronic pontine infarct. Electronically Signed   By: Macy Mis M.D.   On: 10/11/2020 13:29    Scheduled Meds:  (feeding supplement) PROSource Plus  30 mL Oral TID BM   aspirin EC  325 mg Oral Q breakfast   carvedilol  6.25 mg Oral BID WC   chlorhexidine  60 mL Topical Once   Chlorhexidine Gluconate Cloth  6 each Topical Q0600   cholecalciferol  1,000 Units Oral Daily   cloNIDine  0.2 mg Oral TID  docusate sodium  100 mg Oral BID   hydrALAZINE  50 mg Oral BID   insulin aspart  0-6 Units Subcutaneous TID WC   insulin detemir  8 Units Subcutaneous QHS   isoniazid  300 mg Oral Daily   multivitamin  1 tablet Oral QHS   povidone-iodine  2 application Topical Once   povidone-iodine  2 application Topical Once   pyridOXINE  50 mg Oral Daily    rosuvastatin  10 mg Oral Daily   Continuous Infusions:    LOS: 4 days   Kerney Elbe, DO [facility] PAGER is on AMION  If 7PM-7AM, please contact night-coverage [email or web address]

## 2020-10-12 NOTE — Progress Notes (Signed)
    Subjective: 3 Days Post-Op Procedure(s) (LRB): TOTAL HIP ARTHROPLASTY ANTERIOR APPROACH (Right) Patient reports pain as 2 on 0-10 scale.   Denies CP or SOB.  Voiding without difficulty. Positive flatus. Objective: Vital signs in last 24 hours: Temp:  [98 F (36.7 C)-99.1 F (37.3 C)] 99.1 F (37.3 C) (07/01 2335) Pulse Rate:  [72-79] 78 (07/01 2335) Resp:  [15-22] 18 (07/01 2335) BP: (119-159)/(50-69) 159/66 (07/01 2335) SpO2:  [97 %-100 %] 100 % (07/01 2335)  Intake/Output from previous day: No intake/output data recorded. Intake/Output this shift: No intake/output data recorded.  Labs: Recent Labs    10/09/20 1011 10/10/20 0324 10/11/20 0442 10/12/20 0200  HGB 10.0* 9.7* 9.1* 8.4*   Recent Labs    10/11/20 0442 10/12/20 0200  WBC 11.4* 10.4  RBC 2.95* 2.74*  HCT 27.9* 26.0*  PLT 211 201   Recent Labs    10/11/20 0442 10/12/20 0200  NA 133* 131*  K 3.6 3.3*  CL 97* 96*  CO2 27 27  BUN 31* 20  CREATININE 4.45* 3.24*  GLUCOSE 195* 202*  CALCIUM 8.4* 8.0*   No results for input(s): LABPT, INR in the last 72 hours.  Physical Exam: Neurologically intact ABD soft Intact pulses distally Dorsiflexion/Plantar flexion intact Incision: dressing C/D/I and no drainage Compartment soft Body mass index is 29.83 kg/m.   Assessment/Plan: 3 Days Post-Op Procedure(s) (LRB): TOTAL HIP ARTHROPLASTY ANTERIOR APPROACH (Right) Advance diet Up with therapy  Dahlia Bailiff for Dr. Melina Schools [facility] [phone] 10/12/2020, 8:25 AM

## 2020-10-12 NOTE — Plan of Care (Signed)
  Problem: Pain Managment: Goal: General experience of comfort will improve Outcome: Progressing   Problem: Safety: Goal: Ability to remain free from injury will improve Outcome: Progressing   Problem: Skin Integrity: Goal: Risk for impaired skin integrity will decrease Outcome: Progressing   

## 2020-10-12 NOTE — Progress Notes (Signed)
Physical Therapy Treatment Patient Details Name: Trevor [name] MRN: [id number] DOB: July 07, 1940 Today's Date: 10/12/2020    History of Present Illness 80 yo male sustained a fall coming to hosp for placement of HD catheter, sustained a fall on 09/07/20.  Did not get imaging on his hip until later, found R femoral neck fracture.  Now has R THA with direct anterior approach from 6/29, referred to PT.  PMHx:  ESRD on HD, anemia, CKD, DM, HLD, HTN, kidney stones    PT Comments    Patient progressing with mobility.  Was able to take a few steps today, but limited by generalized weakness and decreased balance.  Feel patient would benefit from CIR to reach max potential and return to ambulating, which I feel is feasible goal.  Added ambulation goals.  Will benefit from continued PT to progress mobility and increase independence.      Follow Up Recommendations  CIR     Equipment Recommendations  Rolling walker with 5" wheels    Recommendations for Other Services Rehab consult     Precautions / Restrictions Precautions Precautions: Fall Restrictions RLE Weight Bearing: Weight bearing as tolerated    Mobility  Bed Mobility Overal bed mobility: Needs Assistance Bed Mobility: Supine to Sit     Supine to sit: Min assist     General bed mobility comments: assist to move legs to eob and to raise shoulders off bed    Transfers Overall transfer level: Needs assistance Equipment used: Rolling walker (2 wheeled) Transfers: Sit to/from [facility] Sit to Stand: Mod assist Stand pivot transfers: Mod assist       General transfer comment: assist to power up and maintain balance while taking steps to pivot to chair  Ambulation/Gait                 Stairs             Wheelchair Mobility    Modified Rankin (Stroke Patients Only)       Balance Overall balance assessment: Needs assistance;History of Falls Sitting-balance support: No upper extremity  supported;Feet supported Sitting balance-Leahy Scale: Good       Standing balance-Leahy Scale: Poor Standing balance comment: required min-mod assistance in standing                            Cognition Arousal/Alertness: Awake/alert Behavior During Therapy: WFL for tasks assessed/performed Overall Cognitive Status: Within Functional Limits for tasks assessed                                        Exercises Total Joint Exercises Ankle Circles/Pumps: AROM;Both;10 reps Quad Sets: AROM;Both;10 reps    General Comments        Pertinent Vitals/Pain Pain Assessment: 0-10 Pain Score: 3  Pain Location: R hip Pain Descriptors / Indicators: Operative site guarding;Discomfort;Sore Pain Intervention(s): Limited activity within patient's tolerance;Monitored during session    Home Living                      Prior Function            PT Goals (current goals can now be found in the care plan section) Progress towards PT goals: Progressing toward goals    Frequency    Min 4X/week      PT Plan Discharge plan needs  to be updated    Co-evaluation              AM-PAC PT "6 Clicks" Mobility   Outcome Measure  Help needed turning from your back to your side while in a flat bed without using bedrails?: A Little Help needed moving from lying on your back to sitting on the side of a flat bed without using bedrails?: A Little Help needed moving to and from a bed to a chair (including a wheelchair)?: A Lot Help needed standing up from a chair using your arms (e.g., wheelchair or bedside chair)?: A Lot Help needed to walk in hospital room?: Total Help needed climbing 3-5 steps with a railing? : Total 6 Click Score: 12    End of Session Equipment Utilized During Treatment: Gait belt Activity Tolerance: Patient tolerated treatment well Patient left: in chair;with family/visitor present;with call bell/phone within reach Nurse  Communication: Mobility status PT Visit Diagnosis: Unsteadiness on feet (R26.81);Muscle weakness (generalized) (M62.81);Pain;Difficulty in walking, not elsewhere classified (R26.2);Repeated falls (R29.6);History of falling (Z91.81) Pain - Right/Left: Right Pain - part of body: Hip     Time: 4098-1191 PT Time Calculation (min) (ACUTE ONLY): 25 min  Charges:  $Gait Training: 23-37 mins                     10/12/2020 Margie, PT Acute Rehabilitation Services Pager:  [phone] Office:  [phone]    Shanna Cisco 10/12/2020, 4:41 PM

## 2020-10-13 DIAGNOSIS — K5900 Constipation, unspecified: Secondary | ICD-10-CM

## 2020-10-13 LAB — COMPREHENSIVE METABOLIC PANEL
ALT: 9 U/L (ref 0–44)
AST: 16 U/L (ref 15–41)
Albumin: 2.3 g/dL — ABNORMAL LOW (ref 3.5–5.0)
Alkaline Phosphatase: 32 U/L — ABNORMAL LOW (ref 38–126)
Anion gap: 9 (ref 5–15)
BUN: 45 mg/dL — ABNORMAL HIGH (ref 8–23)
CO2: 24 mmol/L (ref 22–32)
Calcium: 8 mg/dL — ABNORMAL LOW (ref 8.9–10.3)
Chloride: 96 mmol/L — ABNORMAL LOW (ref 98–111)
Creatinine, Ser: 5.1 mg/dL — ABNORMAL HIGH (ref 0.61–1.24)
GFR, Estimated: 11 mL/min — ABNORMAL LOW (ref >60)
Glucose, Bld: 244 mg/dL — ABNORMAL HIGH (ref 70–99)
Potassium: 3.9 mmol/L (ref 3.5–5.1)
Sodium: 129 mmol/L — ABNORMAL LOW (ref 135–145)
Total Bilirubin: 0.7 mg/dL (ref 0.3–1.2)
Total Protein: 5.6 g/dL — ABNORMAL LOW (ref 6.5–8.1)

## 2020-10-13 LAB — CBC WITH DIFFERENTIAL/PLATELET
Abs Immature Granulocytes: 0.07 10*3/uL (ref 0.00–0.07)
Basophils Absolute: 0 10*3/uL (ref 0.0–0.1)
Basophils Relative: 0 %
Eosinophils Absolute: 0.2 10*3/uL (ref 0.0–0.5)
Eosinophils Relative: 2 %
HCT: 25.7 % — ABNORMAL LOW (ref 39.0–52.0)
Hemoglobin: 8.4 g/dL — ABNORMAL LOW (ref 13.0–17.0)
Immature Granulocytes: 1 %
Lymphocytes Relative: 10 %
Lymphs Abs: 1 10*3/uL (ref 0.7–4.0)
MCH: 30.8 pg (ref 26.0–34.0)
MCHC: 32.7 g/dL (ref 30.0–36.0)
MCV: 94.1 fL (ref 80.0–100.0)
Monocytes Absolute: 0.9 10*3/uL (ref 0.1–1.0)
Monocytes Relative: 9 %
Neutro Abs: 7.7 10*3/uL (ref 1.7–7.7)
Neutrophils Relative %: 78 %
Platelets: 207 10*3/uL (ref 150–400)
RBC: 2.73 MIL/uL — ABNORMAL LOW (ref 4.22–5.81)
RDW: 14.9 % (ref 11.5–15.5)
WBC: 9.9 10*3/uL (ref 4.0–10.5)
nRBC: 0 % (ref 0.0–0.2)

## 2020-10-13 LAB — GLUCOSE, CAPILLARY
Glucose-Capillary: 220 mg/dL — ABNORMAL HIGH (ref 70–99)
Glucose-Capillary: 220 mg/dL — ABNORMAL HIGH (ref 70–99)
Glucose-Capillary: 212 mg/dL — ABNORMAL HIGH (ref 70–99)

## 2020-10-13 LAB — IRON AND TIBC
Iron: 14 ug/dL — ABNORMAL LOW (ref 45–182)
Saturation Ratios: 10 % — ABNORMAL LOW (ref 17.9–39.5)
TIBC: 147 ug/dL — ABNORMAL LOW (ref 250–450)
UIBC: 133 ug/dL

## 2020-10-13 LAB — RETICULOCYTES
Immature Retic Fract: 8.4 % (ref 2.3–15.9)
RBC.: 2.79 MIL/uL — ABNORMAL LOW (ref 4.22–5.81)
Retic Count, Absolute: 61.7 10*3/uL (ref 19.0–186.0)
Retic Ct Pct: 2.2 % (ref 0.4–3.1)

## 2020-10-13 LAB — FOLATE: Folate: 12.3 ng/mL (ref >5.9)

## 2020-10-13 LAB — FERRITIN: Ferritin: 327 ng/mL (ref 24–336)

## 2020-10-13 LAB — PHOSPHORUS: Phosphorus: 5.1 mg/dL — ABNORMAL HIGH (ref 2.5–4.6)

## 2020-10-13 LAB — MAGNESIUM: Magnesium: 1.8 mg/dL (ref 1.7–2.4)

## 2020-10-13 LAB — VITAMIN B12: Vitamin B-12: 256 pg/mL (ref 180–914)

## 2020-10-13 MED ORDER — SENNOSIDES-DOCUSATE SODIUM 8.6-50 MG PO TABS
1.0000 | ORAL_TABLET | Freq: Two times a day (BID) | ORAL | Status: DC
  Administered 2020-10-13 – 2020-10-15 (×5): 1 via ORAL
  Filled 2020-10-13 (×5): qty 1

## 2020-10-13 MED ORDER — CHLORHEXIDINE GLUCONATE CLOTH 2 % EX PADS
6.0000 | MEDICATED_PAD | Freq: Every day | CUTANEOUS | Status: DC
  Administered 2020-10-13 – 2020-10-19 (×7): 6 via TOPICAL

## 2020-10-13 MED ORDER — DARBEPOETIN ALFA 60 MCG/0.3ML IJ SOSY
60.0000 ug | PREFILLED_SYRINGE | INTRAMUSCULAR | Status: DC
  Filled 2020-10-13: qty 0.3

## 2020-10-13 MED ORDER — BISACODYL 10 MG RE SUPP: 10.0000 mg | Freq: Every day | RECTAL | Status: DC | PRN

## 2020-10-13 MED ORDER — SODIUM CHLORIDE 0.9 % IV SOLN
125.0000 mg | INTRAVENOUS | Status: DC
  Administered 2020-10-14 – 2020-10-18 (×3): 125 mg via INTRAVENOUS
  Filled 2020-10-13 (×6): qty 10

## 2020-10-13 MED ORDER — HEPARIN SODIUM (PORCINE) 5000 UNIT/ML IJ SOLN
5000.0000 [IU] | Freq: Three times a day (TID) | INTRAMUSCULAR | Status: DC
  Administered 2020-10-13 – 2020-10-19 (×14): 5000 [IU] via SUBCUTANEOUS
  Filled 2020-10-13 (×15): qty 1

## 2020-10-13 MED ORDER — POLYETHYLENE GLYCOL 3350 17 G PO PACK
17.0000 g | PACK | Freq: Two times a day (BID) | ORAL | Status: DC
  Administered 2020-10-13 – 2020-10-15 (×5): 17 g via ORAL
  Filled 2020-10-13 (×7): qty 1

## 2020-10-13 NOTE — Progress Notes (Signed)
PROGRESS NOTE    Trevor [name]  AUQ:[id number] DOB: 1940/05/11 DOA: 10/08/2020 PCP: Celene Squibb, MD   Brief Narrative:  The patient is a 80 year old obese African-American male with a past medical history significant for but not limited to end-stage renal disease on hemodialysis Monday Wednesday Friday, controlled diabetes mellitus type 2 as well as other comorbidities who presented to ED with hip pain and inability ambulate for about a month.  He went to the [facility] to get hemodialysis port replacement for 12 dizzy and fell.  At the [facility] ED he received a knee x-ray which revealed no injury.  After port replacement discharge patient to experience severe right hip pain rendering him unable to walk for about a month.  He saw Dr. Patrick Jupiter Of orthopedic surgery who ordered a hip x-ray which revealed a displaced femoral neck fracture.  Patient was needing surgery and transferred to [facility] for complex anesthesia with dialysis and orthopedic surgery and nephrology was consulted.  Patient is currently going to go for a right hip total arthroplasty from anterior approach and this was done 10/09/20.  Today he went for dialysis and tolerated it well but when he came back he was significantly confused. He was given Narcotics in dialysis. Narcan was given and did not change the patient's Mental Status. Neurology consulted and recommending MRI given his asterixis and poly myoclonus. Ammonia and ABG were also ordered. BP was elevated so was given Hydralazine   MRI Done and showed no acute CVA but did show chronic Pontine Infarct and Chronic White Matter Disease.  Neurology recommending checking a B12 and a TSH and treat if abnormal but both were normal. Social Work assisting with disposition and placement and now PT Recommending CIR so will place Rehab consult for assessment and evaluation.   Assessment & Plan:   Principal Problem:   Hip fracture ([place]) Active Problems:   Diabetes mellitus with stage 5  chronic kidney disease (HCC)   Anemia   Gastroesophageal reflux disease   Hyperlipidemia   Neuropathy   End stage renal disease (HCC)   Hyponatremia   Hyperglycemia   Hip pain  Right Closed Displaced Femoral Neck Fracture Acute Hip Pain -Confirmed on imaging on 10/08/20 as an outpatient  -Orthopedic Surgery was consulted and underwent Right Total Hip Arthroplasty Anterior Approch on 10/09/20 -VTE Prophylaxis per Orthopedic Surgery and they are recommending aspirin, SCDs and teds -WB Status per Ortho and they are recommending weightbearing as tolerated with walker -PT/OT to evaluate and Treat and now recommending CIR; Initially recommended SNF -Pain Control with Hydrocodone-Acetaminophen 1-2 tab\ q6hprn Severe Pain, and IV Hydromorphone 0.5 mg q3hprn Severe Pain but may need to be more Judicious given Confusion and Reduced Concentration and Twitching  -Bowel Regimen Post-Op but has Miralax 17 g po Daily PRN and Docusate 100 mg po BID but will change given Constipation -Ortho Recommending Advancing Diet and Up with Therapy   Constipation -Bowel Regimen Post-Op but has Miralax 17 g po Daily PRN and Docusate 100 mg po BID but will change given Constipation -Will change Miralax to 17 grams po BID and Change Docusate to Senna-Docusate 1 tab po BID -Also will add Bisacodyl Suppository 10 mg RC -IF necessary will give Enema    DM  in setting of ESRD  -stable, last A1c was 6.2 -POC glucose was in 300s initially  -SSI Novolog 0-6 units TID with meals -Continue insulin detemir 8 units qhs but may adjust further -Continue to Monitor CBG's per Protocol;  CBGs now ranging from 175-283 -Consult diabetes education coordinator for further assistance with blood sugar management -Blood sugar on this morning's CMP was 244   ESRD on HD MWF Metabolic Acidosis Hyperphosphatemia -Creatinine 6-7 at baseline.  -Patient's BUN/Cr went from 44/4.44 -> 51/5.05 -> 54/5.53 -> 31/4.45 -> 20/3.24 ->  45/5/10 -Patient's Phos Level went from 4.7 -> 3.5 -> 5.1 -Patient had a Metabolic Acidosis this this is improved as CO2 is 24, AG is 9, and Chloride Level is 96 -Continiue HDS as scheduled (M, W, F) -Vit D3 1000 units -Nephrology evaluated and he underwent twice (Last session yesterdy with 1 Liter Ultrafiltration) with next session scheduled for tomorrow 10/14/20   Hypokalemia -Mild at 3.3 and improved to 3.9 -Further management per Dialysis session -Continue to Monitor and Replete as Necessary -Repeat CMP in the AM    Hypertension  -Patient took home meds this AM (coreg, clonidine, statin). BP systolic 158X in ED in setting of severe pain. Improved with hydralazine 10mg  IV -C/w Carvedilol 6.25 mg bid PO -C/w Hydralazine 50 mg bid PO -C/w Clonidine 0.2 mg TID -C/w PRN Hydralazine  -Continue to Monitor BP per Protocols; Last BP was 156/58  Confusion/Encephalopathy and Polymyoclonus, improved and back to baseline -Likely Medication induced -Patient could not follow commands and would not communicate with the Nurse after Dialysis -Spoke with Neurology who recommended calling a CODE Stroke initially but then cancelled -Given Narcan and obtained ABG -ABG    Component Value Date/Time   PHART 7.428 10/10/2020 1250   PCO2ART 36.6 10/10/2020 1250   PO2ART 107 10/10/2020 1250   HCO3 23.0 10/10/2020 1250   TCO2 23 11/30/2019 0619   ACIDBASEDEF 0.3 10/10/2020 1250   O2SAT 98.0 10/10/2020 1250  -Was going to get a Stat Head CT but Neuro recommended changing to MRI of Brain; MRI Brain showed "No evidence of recent infarction, hemorrhage, or mass. Chronic microvascular ischemic changes.  Chronic pontine infarct." -Ammonia Level was 21 -Neurology formally consulted and now recommending checking B12 and TSH both of which were normal. -Patient's Mental Status has improved to Baseline   HLD -Rosuvastatin 10 mg   Hyponatremia  -In setting of ESRD and volume overload -Na+ 132 on Admission and  today was 129 -Continue hemodialysis as scheduled and likley to be correct in Dialysis -Repeat CMP in the AM   HLD -C/w Rosuvastatin 10 mg po Daily   Anemia of chronic disease  -Baseline Hgb ~8.5-9. Admission was 9.9 -Hgb/Hct went from 9.9/30.2 -> 10.0/31.0 -> 9.7/30.5 -> 9.1/27.9 -> 8.4/26.0 -> 8.4/25.7 -As Expected had a Drop Post Operatively -Nephrology planning on ordering IV Iron with Dialysis  -Checked Anemia Panel and showed iron level of 13, U IBC of 133, TIBC 147, saturation ratios of 10%, ferritin of 327, folate level 12.3, vitamin B12 256 -Continue to Monitor For S/Sx of Bleeding; No overt Bleeding noted -Repeat CBC in the AM   Latent TB -C/w Isoniazide 300 mg po Daily and Pyridoxine 50 mg   Obesity -Complicates overall Prognosis and Care -Estimated body mass index is 29.83 kg/m as calculated from the following:   Height as of this encounter: 5\' 11"  (1.803 m).   Weight as of this encounter: 97 kg. -Weight Loss and Dietary Counseling given -Dietary evaluated and recommending 30 mL of Prosource plus 3 times daily as well as renal multivitamin daily  Leukocytosis -Likely in the setting of Surgical Intervention -Patient's WBC went from 7.9 -> 11.7 -> 11.4 -> 10.4 -> 9.9 -Continue to Monitor and  Trend  -Repeat CBC in the AM   DVT prophylaxis: Heparin 5,000 units sq q8h Code Status: FULL CODE  Family Communication: No Family present at bedside Disposition Plan: Pending further clearance by Ortho and Nephrology and Evaluation by PT/OT; Now PT/OT Recommending CIR  Status is: Inpatient  Remains inpatient appropriate because:Unsafe d/c plan, IV treatments appropriate due to intensity of illness or inability to take PO, and Inpatient level of care appropriate due to severity of illness  Dispo: The patient is from: Home              Anticipated d/c is to: SNF              Patient currently is not medically stable to d/c.   Difficult to place patient No  Consultants:   Orthopedic Surgery Nephrology  Neurology   Procedures: Right Total Hip Arthroplasty Anterior Approach on 10/09/20 by Dr. Lyla Glassing   Antimicrobials:  Anti-infectives (From admission, onward)    Start     Dose/Rate Route Frequency Ordered Stop   10/10/20 0600  ceFAZolin (ANCEF) IVPB 2g/100 mL premix  Status:  Discontinued        2 g 200 mL/hr over 30 Minutes Intravenous On call to O.R. 10/09/20 2106 10/09/20 2130   10/09/20 2200  ceFAZolin (ANCEF) IVPB 2g/100 mL premix  Status:  Discontinued        2 g 200 mL/hr over 30 Minutes Intravenous Every 6 hours 10/09/20 2109 10/10/20 0036   10/09/20 1000  isoniazid (NYDRAZID) tablet 300 mg        300 mg Oral Daily 10/08/20 1629     10/09/20 0830  ceFAZolin (ANCEF) IVPB 2g/100 mL premix        2 g 200 mL/hr over 30 Minutes Intravenous On call to O.R. 10/09/20 0962 10/09/20 1502        Subjective: Seen and examined and still a little down. States he had disturbed sleep last night because they woke him up "too many times last night." No pain currently. Still has not had a bowel movement since the day of admission. No CP or SOB. No other concerns of complaints at this time.   Objective: Vitals:   10/12/20 0848 10/12/20 1615 10/12/20 2009 10/13/20 0759  BP: (!) 151/66 (!) 155/68 (!) 146/58 (!) 156/58  Pulse: 71 80 (!) 57 61  Resp: 17 18 16 17   Temp: 98.7 F (37.1 C) 98.5 F (36.9 C) 98.1 F (36.7 C) 98.7 F (37.1 C)  TempSrc: Oral Oral Oral Oral  SpO2: 100% 100% 100% 100%  Weight:      Height:       No intake or output data in the 24 hours ending 10/13/20 0832  Filed Weights   10/08/20 2300 10/10/20 0740 10/10/20 1123  Weight: 109.8 kg 98.3 kg 97 kg   Examination: Physical Exam:  Constitutional: WN/WD overweight AAM in NAD appears depressed slightly and withdrawn  Eyes: Lids and conjunctivae normal, sclerae anicteric  ENMT: External Ears, Nose appear normal. Grossly normal hearing.  Neck: Appears normal, supple, no cervical  masses, normal ROM, no appreciable thyromegaly; no JVD Respiratory: Diminished to auscultation bilaterally, no wheezing, rales, rhonchi or crackles. Normal respiratory effort and patient is not tachypenic. No accessory muscle use. Unlabored breathing  Cardiovascular: RRR, no murmurs / rubs / gallops. S1 and S2 auscultated. Mild edema Abdomen: Soft, non-tender, Distended 2/2 body habitus. Bowel sounds positive.  GU: Deferred. Musculoskeletal: No clubbing / cyanosis of digits/nails. No joint deformity upper and lower  extremities. Has a Right chest wall TDC Skin: No rashes, lesions, ulcers on a limited skin evaluation. No induration; Warm and dry.  Neurologic: CN 2-12 grossly intact with no focal deficits. Romberg sign and cerebellar reflexes not assessed.  Psychiatric: Normal judgment and insight. Alert and oriented x 3. Withdrawn and slightly depressed mood and appropriate affect.   Data Reviewed: I have personally reviewed following labs and imaging studies  CBC: Recent Labs  Lab 10/08/20 1224 10/09/20 0558 10/09/20 1011 10/10/20 0324 10/11/20 0442 10/12/20 0200 10/13/20 0215  WBC 7.9   < > 7.9 11.7* 11.4* 10.4 9.9  NEUTROABS 6.3  --  6.3  --  8.9* 8.1* 7.7  HGB 9.9*   < > 10.0* 9.7* 9.1* 8.4* 8.4*  HCT 31.4*   < > 31.0* 30.5* 27.9* 26.0* 25.7*  MCV 97.5   < > 94.5 96.5 94.6 94.9 94.1  PLT 231   < > 247 196 211 201 207   < > = values in this interval not displayed.    Basic Metabolic Panel: Recent Labs  Lab 10/09/20 0558 10/09/20 1011 10/10/20 0324 10/11/20 0442 10/12/20 0200 10/13/20 0215  NA 133*  --  134* 133* 131* 129*  K 3.7  --  4.5 3.6 3.3* 3.9  CL 99  --  101 97* 96* 96*  CO2 23  --  21* 27 27 24   GLUCOSE 219*  --  213* 195* 202* 244*  BUN 51*  --  54* 31* 20 45*  CREATININE 5.05*  --  5.53* 4.45* 3.24* 5.10*  CALCIUM 8.9  --  8.7* 8.4* 8.0* 8.0*  MG 1.6* 1.8  --  1.7 1.7 1.8  PHOS 4.7* 4.4 5.1* 4.7* 3.5 5.1*    GFR: Estimated Creatinine Clearance: 14  mL/min (A) (by C-G formula based on SCr of 5.1 mg/dL (H)). Liver Function Tests: Recent Labs  Lab 10/09/20 0558 10/10/20 0324 10/11/20 0442 10/12/20 0200 10/13/20 0215  AST  --   --  18 18 16   ALT  --   --  10 9 9   ALKPHOS  --   --  35* 32* 32*  BILITOT  --   --  0.8 0.7 0.7  PROT  --   --  6.0* 5.4* 5.6*  ALBUMIN 2.8* 3.0* 2.6* 2.3* 2.3*    No results for input(s): LIPASE, AMYLASE in the last 168 hours. Recent Labs  Lab 10/10/20 1236  AMMONIA 21    Coagulation Profile: Recent Labs  Lab 10/08/20 1224  INR 1.1    Cardiac Enzymes: No results for input(s): CKTOTAL, CKMB, CKMBINDEX, TROPONINI in the last 168 hours. BNP (last 3 results) No results for input(s): PROBNP in the last 8760 hours. HbA1C: No results for input(s): HGBA1C in the last 72 hours.  CBG: Recent Labs  Lab 10/12/20 0643 10/12/20 1245 10/12/20 1727 10/12/20 2221 10/13/20 0659  GLUCAP 175* 283* 200* 226* 220*    Lipid Profile: No results for input(s): CHOL, HDL, LDLCALC, TRIG, CHOLHDL, LDLDIRECT in the last 72 hours. Thyroid Function Tests: Recent Labs    10/12/20 0200  TSH 2.373    Anemia Panel: Recent Labs    10/12/20 0200 10/13/20 0215  VITAMINB12 229 256  FOLATE  --  12.3  FERRITIN  --  327  TIBC  --  147*  IRON  --  14*  RETICCTPCT  --  2.2    Sepsis Labs: No results for input(s): PROCALCITON, LATICACIDVEN in the last 168 hours.  Recent Results (from the  past 240 hour(s))  Resp Panel by RT-PCR (Flu A&B, Covid) Nasopharyngeal Swab     Status: None   Collection Time: 10/08/20  1:44 PM   Specimen: Nasopharyngeal Swab; Nasopharyngeal(NP) swabs in vial transport medium  Result Value Ref Range Status   SARS Coronavirus 2 by RT PCR NEGATIVE NEGATIVE Final    Comment: (NOTE) SARS-CoV-2 target nucleic acids are NOT DETECTED.  The SARS-CoV-2 RNA is generally detectable in upper respiratory specimens during the acute phase of infection. The lowest concentration of SARS-CoV-2  viral copies this assay can detect is 138 copies/mL. A negative result does not preclude SARS-Cov-2 infection and should not be used as the sole basis for treatment or other patient management decisions. A negative result may occur with  improper specimen collection/handling, submission of specimen other than nasopharyngeal swab, presence of viral mutation(s) within the areas targeted by this assay, and inadequate number of viral copies(<138 copies/mL). A negative result must be combined with clinical observations, patient history, and epidemiological information. The expected result is Negative.  Fact Sheet for Patients:  EntrepreneurPulse.com.au  Fact Sheet for Healthcare Providers:  IncredibleEmployment.be  This test is no t yet approved or cleared by the [place] FDA and  has been authorized for detection and/or diagnosis of SARS-CoV-2 by FDA under an Emergency Use Authorization (EUA). This EUA will remain  in effect (meaning this test can be used) for the duration of the COVID-19 declaration under Section 564(b)(1) of the Act, 21 U.S.C.section 360bbb-3(b)(1), unless the authorization is terminated  or revoked sooner.       Influenza A by PCR NEGATIVE NEGATIVE Final   Influenza B by PCR NEGATIVE NEGATIVE Final    Comment: (NOTE) The Xpert Xpress SARS-CoV-2/FLU/RSV plus assay is intended as an aid in the diagnosis of influenza from Nasopharyngeal swab specimens and should not be used as a sole basis for treatment. Nasal washings and aspirates are unacceptable for Xpert Xpress SARS-CoV-2/FLU/RSV testing.  Fact Sheet for Patients: EntrepreneurPulse.com.au  Fact Sheet for Healthcare Providers: IncredibleEmployment.be  This test is not yet approved or cleared by the [place] FDA and has been authorized for detection and/or diagnosis of SARS-CoV-2 by FDA under an Emergency Use Authorization  (EUA). This EUA will remain in effect (meaning this test can be used) for the duration of the COVID-19 declaration under Section 564(b)(1) of the Act, 21 U.S.C. section 360bbb-3(b)(1), unless the authorization is terminated or revoked.  Performed at [facility], [address], [place], [place] [address]   Surgical pcr screen     Status: None   Collection Time: 10/09/20  3:58 AM   Specimen: Nasal Mucosa; Nasal Swab  Result Value Ref Range Status   MRSA, PCR NEGATIVE NEGATIVE Final   Staphylococcus aureus NEGATIVE NEGATIVE Final    Comment: (NOTE) The Xpert SA Assay (FDA approved for NASAL specimens in patients 76 years of age and older), is one component of a comprehensive surveillance program. It is not intended to diagnose infection nor to guide or monitor treatment. Performed at [facility], [place] [address] [place], [place] [address]       RN Pressure Injury Documentation:     Estimated body mass index is 29.83 kg/m as calculated from the following:   Height as of this encounter: 5\' 11"  (1.803 m).   Weight as of this encounter: 97 kg.  Malnutrition Type:  Malnutrition Characteristics: Signs/Symptoms: estimated needs Nutrition Interventions: Interventions: MVI, Prostat   Radiology Studies: MR BRAIN WO CONTRAST  Result Date:  10/11/2020 CLINICAL DATA:  Mental status change EXAM: MRI HEAD WITHOUT CONTRAST TECHNIQUE: Multiplanar, multiecho pulse sequences of the brain and surrounding structures were obtained without intravenous contrast. COMPARISON:  None. FINDINGS: Motion artifact is present. Brain: There is no acute infarction or intracranial hemorrhage. There is no intracranial mass, mass effect, or edema. There is no hydrocephalus or extra-axial fluid collection. Prominence of the ventricles and sulci reflects generalized parenchymal volume loss. Patchy and confluent areas of T2 hyperintensity in the supratentorial and pontine white matter nonspecific but may  reflect mild to moderate chronic microvascular ischemic changes. There is a chronic infarct of the left parasagittal pons. Vascular: Major vessel flow voids at the skull base are preserved. Skull and upper cervical spine: Normal marrow signal is preserved. Sinuses/Orbits: Paranasal sinuses are aerated. Orbits are unremarkable. Other: Sella is unremarkable.  Mastoid air cells are clear. IMPRESSION: No evidence of recent infarction, hemorrhage, or mass. Chronic microvascular ischemic changes.  Chronic pontine infarct. Electronically Signed   By: Macy Mis M.D.   On: 10/11/2020 13:29    Scheduled Meds:  (feeding supplement) PROSource Plus  30 mL Oral TID BM   aspirin EC  325 mg Oral Q breakfast   carvedilol  6.25 mg Oral BID WC   chlorhexidine  60 mL Topical Once   Chlorhexidine Gluconate Cloth  6 each Topical Q0600   cholecalciferol  1,000 Units Oral Daily   cloNIDine  0.2 mg Oral TID   docusate sodium  100 mg Oral BID   hydrALAZINE  50 mg Oral BID   insulin aspart  0-6 Units Subcutaneous TID WC   insulin detemir  8 Units Subcutaneous QHS   isoniazid  300 mg Oral Daily   multivitamin  1 tablet Oral QHS   povidone-iodine  2 application Topical Once   povidone-iodine  2 application Topical Once   pyridOXINE  50 mg Oral Daily   rosuvastatin  10 mg Oral Daily   Continuous Infusions:    LOS: 5 days   Kerney Elbe, DO [facility] PAGER is on AMION  If 7PM-7AM, please contact night-coverage [email or web address]

## 2020-10-13 NOTE — Progress Notes (Signed)
Trevor [name]  MRN: [id number] DOB/Age: Aug 16, 1940 80 y.o. Physician: Ander Slade, M.D. 4 Days Post-Op Procedure(s) (LRB): TOTAL HIP ARTHROPLASTY ANTERIOR APPROACH (Right)  Subjective: Patient resting comfortably in bed.  Denies hip pain.  Overall slept well last night except complains of having been woken at 3 AM for vital sign check and lab draw. Vital Signs Temp:  [98.1 F (36.7 C)-98.7 F (37.1 C)] 98.7 F (37.1 C) (07/03 0759) Pulse Rate:  [57-80] 61 (07/03 0759) Resp:  [16-18] 17 (07/03 0759) BP: (146-156)/(58-68) 156/58 (07/03 0759) SpO2:  [100 %] 100 % (07/03 0759)  Lab Results Recent Labs    10/12/20 0200 10/13/20 0215  WBC 10.4 9.9  HGB 8.4* 8.4*  HCT 26.0* 25.7*  PLT 201 207   BMET Recent Labs    10/12/20 0200 10/13/20 0215  NA 131* 129*  K 3.3* 3.9  CL 96* 96*  CO2 27 24  GLUCOSE 202* 244*  BUN 20 45*  CREATININE 3.24* 5.10*  CALCIUM 8.0* 8.0*   INR  Date Value Ref Range Status  10/08/2020 1.1 0.8 - 1.2 Final    Comment:    (NOTE) INR goal varies based on device and disease states. Performed at [facility], [address], [place], [place] [address]      Exam  Right hip dressing clean and dry.  Compartments are soft.  Grossly neurovascular intact right lower extremity.  Plan Continue to mobilize with physical therapy following anterior total hip protocol.  Ongoing medical management per internal medicine/nephrology/neurology. Taia Bramlett M Maisen Schmit 10/13/2020, 9:32 AM   Contact # [phone]

## 2020-10-13 NOTE — Progress Notes (Signed)
Inpatient Rehab Admissions:  Inpatient Rehab Consult received.  I met with patient at the bedside for rehabilitation assessment and to discuss goals and expectations of an inpatient rehab admission.  Pt acknowledged understanding of goals and expectations. Pt interested in pursuing CIR. Pt gave permission to contact wife, Elvin So. Wife expressed understanding of CIR goals and expectations. Wife also interested in pt pursuing CIR.  Will continue to follow.  Signed: Gayland Curry, [place], [place] Admissions Coordinator [phone]

## 2020-10-13 NOTE — Progress Notes (Signed)
[facility] NEPHROLOGY PROGRESS NOTE  Assessment/ Plan: Pt is a 80 y.o. yo male  with history of hypertension, DM, HLD, nephrolithiasis, ESRD on HD MWF at [facility] followed by Dr. Theador Hawthorne, transferred from [facility] for displaced femoral neck fracture, seen as a consultation for the management of ESRD.  #Right femoral neck fracture: Seen by orthopedics and underwent right total hip arthroplasty on 6/29.  PT OT and rehab per primary and orthopedics team.    # ESRD: MWF at [facility]  Last HD on 7/1 with 1 L UF, tolerated well without any issue. Plan for next dialysis on 7/4.  Right IJ TDC for the access.   # Hypertension: On antihypertensive.  Volume management by ultrafiltration.   # Anemia of ESRD: Hemoglobin stable.  Need outpatient record about ESA treatment.  Iron saturation 10% therefore I will order IV iron with dialysis. Monitor hemoglobin.   # Metabolic Bone Disease: Calcium and phosphorus level at goal.  #Altered mental status: Seen by neurology.  MRI brain with chronic microvascular ischemic changes and chronic pontine infarct without evidence of acute finding.   He looks alert awake and around his baseline.  #Hyponatremia/hypokalemia: Managed by dialysis.   Subjective: Seen and examined at bedside.  No new issue.  Denies pain, dizziness, nausea, vomiting, chest pain, shortness of breath. Objective Vital signs in last 24 hours: Vitals:   10/12/20 0848 10/12/20 1615 10/12/20 2009 10/13/20 0759  BP: (!) 151/66 (!) 155/68 (!) 146/58 (!) 156/58  Pulse: 71 80 (!) 57 61  Resp: 17 18 16 17   Temp: 98.7 F (37.1 C) 98.5 F (36.9 C) 98.1 F (36.7 C) 98.7 F (37.1 C)  TempSrc: Oral Oral Oral Oral  SpO2: 100% 100% 100% 100%  Weight:      Height:       Weight change:   Intake/Output Summary (Last 24 hours) at 10/13/2020 1017 Last data filed at 10/13/2020 0900 Gross per 24 hour  Intake 120 ml  Output --  Net 120 ml         Labs: Basic Metabolic Panel: Recent Labs  Lab 10/11/20 0442 10/12/20 0200 10/13/20 0215  NA 133* 131* 129*  K 3.6 3.3* 3.9  CL 97* 96* 96*  CO2 27 27 24   GLUCOSE 195* 202* 244*  BUN 31* 20 45*  CREATININE 4.45* 3.24* 5.10*  CALCIUM 8.4* 8.0* 8.0*  PHOS 4.7* 3.5 5.1*    Liver Function Tests: Recent Labs  Lab 10/11/20 0442 10/12/20 0200 10/13/20 0215  AST 18 18 16   ALT 10 9 9   ALKPHOS 35* 32* 32*  BILITOT 0.8 0.7 0.7  PROT 6.0* 5.4* 5.6*  ALBUMIN 2.6* 2.3* 2.3*    No results for input(s): LIPASE, AMYLASE in the last 168 hours. Recent Labs  Lab 10/10/20 1236  AMMONIA 21    CBC: Recent Labs  Lab 10/09/20 1011 10/10/20 0324 10/11/20 0442 10/12/20 0200 10/13/20 0215  WBC 7.9 11.7* 11.4* 10.4 9.9  NEUTROABS 6.3  --  8.9* 8.1* 7.7  HGB 10.0* 9.7* 9.1* 8.4* 8.4*  HCT 31.0* 30.5* 27.9* 26.0* 25.7*  MCV 94.5 96.5 94.6 94.9 94.1  PLT 247 196 211 201 207    Cardiac Enzymes: No results for input(s): CKTOTAL, CKMB, CKMBINDEX, TROPONINI in the last 168 hours. CBG: Recent Labs  Lab 10/12/20 0643 10/12/20 1245 10/12/20 1727 10/12/20 2221 10/13/20 0659  GLUCAP 175* 283* 200* 226* 220*     Iron Studies:  Recent Labs    10/13/20 0215  IRON 14*  TIBC 147*  FERRITIN 327   Studies/Results: MR BRAIN WO CONTRAST  Result Date: 10/11/2020 CLINICAL DATA:  Mental status change EXAM: MRI HEAD WITHOUT CONTRAST TECHNIQUE: Multiplanar, multiecho pulse sequences of the brain and surrounding structures were obtained without intravenous contrast. COMPARISON:  None. FINDINGS: Motion artifact is present. Brain: There is no acute infarction or intracranial hemorrhage. There is no intracranial mass, mass effect, or edema. There is no hydrocephalus or extra-axial fluid collection. Prominence of the ventricles and sulci reflects generalized parenchymal volume loss. Patchy and confluent areas of T2 hyperintensity in the supratentorial and pontine white matter  nonspecific but may reflect mild to moderate chronic microvascular ischemic changes. There is a chronic infarct of the left parasagittal pons. Vascular: Major vessel flow voids at the skull base are preserved. Skull and upper cervical spine: Normal marrow signal is preserved. Sinuses/Orbits: Paranasal sinuses are aerated. Orbits are unremarkable. Other: Sella is unremarkable.  Mastoid air cells are clear. IMPRESSION: No evidence of recent infarction, hemorrhage, or mass. Chronic microvascular ischemic changes.  Chronic pontine infarct. Electronically Signed   By: Macy Mis M.D.   On: 10/11/2020 13:29    Medications: Infusions:   Scheduled Medications:  (feeding supplement) PROSource Plus  30 mL Oral TID BM   aspirin EC  325 mg Oral Q breakfast   carvedilol  6.25 mg Oral BID WC   chlorhexidine  60 mL Topical Once   Chlorhexidine Gluconate Cloth  6 each Topical Q0600   cholecalciferol  1,000 Units Oral Daily   cloNIDine  0.2 mg Oral TID   docusate sodium  100 mg Oral BID   hydrALAZINE  50 mg Oral BID   insulin aspart  0-6 Units Subcutaneous TID WC   insulin detemir  8 Units Subcutaneous QHS   isoniazid  300 mg Oral Daily   multivitamin  1 tablet Oral QHS   povidone-iodine  2 application Topical Once   povidone-iodine  2 application Topical Once   pyridOXINE  50 mg Oral Daily   rosuvastatin  10 mg Oral Daily    have reviewed scheduled and prn medications.  Physical Exam: General: Looks comfortable, not in distress Heart:RRR, s1s2 nl Lungs: Clear b/l, no crackle Abdomen:soft, Non-tender, non-distended Extremities:No LE edema Dialysis Access: Right IJ TDC.  Rheanne Cortopassi Prasad Sherrine Salberg 10/13/2020,10:17 AM  LOS: 5 days

## 2020-10-14 LAB — CBC
HCT: 26.6 % — ABNORMAL LOW (ref 39.0–52.0)
HCT: 28.7 % — ABNORMAL LOW (ref 39.0–52.0)
Hemoglobin: 8.6 g/dL — ABNORMAL LOW (ref 13.0–17.0)
Hemoglobin: 9.1 g/dL — ABNORMAL LOW (ref 13.0–17.0)
MCH: 29.9 pg (ref 26.0–34.0)
MCH: 29.3 pg (ref 26.0–34.0)
MCHC: 32.3 g/dL (ref 30.0–36.0)
MCHC: 31.7 g/dL (ref 30.0–36.0)
MCV: 92.4 fL (ref 80.0–100.0)
MCV: 92.3 fL (ref 80.0–100.0)
Platelets: 299 10*3/uL (ref 150–400)
Platelets: 312 10*3/uL (ref 150–400)
RBC: 2.88 MIL/uL — ABNORMAL LOW (ref 4.22–5.81)
RBC: 3.11 MIL/uL — ABNORMAL LOW (ref 4.22–5.81)
RDW: 14.6 % (ref 11.5–15.5)
RDW: 14.6 % (ref 11.5–15.5)
WBC: 10.6 10*3/uL — ABNORMAL HIGH (ref 4.0–10.5)
WBC: 11.8 10*3/uL — ABNORMAL HIGH (ref 4.0–10.5)
nRBC: 0 % (ref 0.0–0.2)
nRBC: 0 % (ref 0.0–0.2)

## 2020-10-14 LAB — COMPREHENSIVE METABOLIC PANEL
ALT: 7 U/L (ref 0–44)
ALT: 9 U/L (ref 0–44)
AST: 12 U/L — ABNORMAL LOW (ref 15–41)
AST: 17 U/L (ref 15–41)
Albumin: 2.3 g/dL — ABNORMAL LOW (ref 3.5–5.0)
Albumin: 2.6 g/dL — ABNORMAL LOW (ref 3.5–5.0)
Alkaline Phosphatase: 31 U/L — ABNORMAL LOW (ref 38–126)
Alkaline Phosphatase: 37 U/L — ABNORMAL LOW (ref 38–126)
Anion gap: 7 (ref 5–15)
Anion gap: 9 (ref 5–15)
BUN: 63 mg/dL — ABNORMAL HIGH (ref 8–23)
BUN: 29 mg/dL — ABNORMAL HIGH (ref 8–23)
CO2: 25 mmol/L (ref 22–32)
CO2: 27 mmol/L (ref 22–32)
Calcium: 8.1 mg/dL — ABNORMAL LOW (ref 8.9–10.3)
Calcium: 8.2 mg/dL — ABNORMAL LOW (ref 8.9–10.3)
Chloride: 97 mmol/L — ABNORMAL LOW (ref 98–111)
Chloride: 98 mmol/L (ref 98–111)
Creatinine, Ser: 6.2 mg/dL — ABNORMAL HIGH (ref 0.61–1.24)
Creatinine, Ser: 3.4 mg/dL — ABNORMAL HIGH (ref 0.61–1.24)
GFR, Estimated: 9 mL/min — ABNORMAL LOW (ref >60)
GFR, Estimated: 18 mL/min — ABNORMAL LOW (ref >60)
Glucose, Bld: 232 mg/dL — ABNORMAL HIGH (ref 70–99)
Glucose, Bld: 159 mg/dL — ABNORMAL HIGH (ref 70–99)
Potassium: 4.4 mmol/L (ref 3.5–5.1)
Potassium: 3.6 mmol/L (ref 3.5–5.1)
Sodium: 129 mmol/L — ABNORMAL LOW (ref 135–145)
Sodium: 134 mmol/L — ABNORMAL LOW (ref 135–145)
Total Bilirubin: 0.5 mg/dL (ref 0.3–1.2)
Total Bilirubin: 0.9 mg/dL (ref 0.3–1.2)
Total Protein: 5.6 g/dL — ABNORMAL LOW (ref 6.5–8.1)
Total Protein: 6.6 g/dL (ref 6.5–8.1)

## 2020-10-14 LAB — CBC WITH DIFFERENTIAL/PLATELET
Abs Immature Granulocytes: 0.04 10*3/uL (ref 0.00–0.07)
Basophils Absolute: 0 10*3/uL (ref 0.0–0.1)
Basophils Relative: 0 %
Eosinophils Absolute: 0.2 10*3/uL (ref 0.0–0.5)
Eosinophils Relative: 2 %
HCT: 24.8 % — ABNORMAL LOW (ref 39.0–52.0)
Hemoglobin: 8.3 g/dL — ABNORMAL LOW (ref 13.0–17.0)
Immature Granulocytes: 0 %
Lymphocytes Relative: 11 %
Lymphs Abs: 1 10*3/uL (ref 0.7–4.0)
MCH: 31.2 pg (ref 26.0–34.0)
MCHC: 33.5 g/dL (ref 30.0–36.0)
MCV: 93.2 fL (ref 80.0–100.0)
Monocytes Absolute: 0.8 10*3/uL (ref 0.1–1.0)
Monocytes Relative: 8 %
Neutro Abs: 7.4 10*3/uL (ref 1.7–7.7)
Neutrophils Relative %: 79 %
Platelets: 275 10*3/uL (ref 150–400)
RBC: 2.66 MIL/uL — ABNORMAL LOW (ref 4.22–5.81)
RDW: 14.6 % (ref 11.5–15.5)
WBC: 9.5 10*3/uL (ref 4.0–10.5)
nRBC: 0 % (ref 0.0–0.2)

## 2020-10-14 LAB — GLUCOSE, CAPILLARY
Glucose-Capillary: 182 mg/dL — ABNORMAL HIGH (ref 70–99)
Glucose-Capillary: 219 mg/dL — ABNORMAL HIGH (ref 70–99)
Glucose-Capillary: 169 mg/dL — ABNORMAL HIGH (ref 70–99)
Glucose-Capillary: 166 mg/dL — ABNORMAL HIGH (ref 70–99)
Glucose-Capillary: 181 mg/dL — ABNORMAL HIGH (ref 70–99)

## 2020-10-14 LAB — HEPATITIS B SURFACE ANTIGEN: Hepatitis B Surface Ag: NONREACTIVE

## 2020-10-14 LAB — HEPATITIS B CORE ANTIBODY, TOTAL: Hep B Core Total Ab: NONREACTIVE

## 2020-10-14 LAB — MAGNESIUM
Magnesium: 1.7 mg/dL (ref 1.7–2.4)
Magnesium: 1.8 mg/dL (ref 1.7–2.4)

## 2020-10-14 LAB — PHOSPHORUS
Phosphorus: 5.2 mg/dL — ABNORMAL HIGH (ref 2.5–4.6)
Phosphorus: 2.9 mg/dL (ref 2.5–4.6)

## 2020-10-14 LAB — HEPATITIS B SURFACE ANTIBODY,QUALITATIVE: Hep B S Ab: REACTIVE — AB

## 2020-10-14 MED ORDER — HEPARIN SODIUM (PORCINE) 1000 UNIT/ML DIALYSIS
20.0000 [IU]/kg | INTRAMUSCULAR | Status: DC | PRN
  Filled 2020-10-14: qty 2

## 2020-10-14 MED ORDER — ALTEPLASE 2 MG IJ SOLR
2.0000 mg | Freq: Once | INTRAMUSCULAR | Status: DC | PRN
  Filled 2020-10-14: qty 2

## 2020-10-14 MED ORDER — LIDOCAINE-PRILOCAINE 2.5-2.5 % EX CREA
1.0000 "application " | TOPICAL_CREAM | CUTANEOUS | Status: DC | PRN
  Filled 2020-10-14: qty 5

## 2020-10-14 MED ORDER — HEPARIN SODIUM (PORCINE) 1000 UNIT/ML DIALYSIS
1000.0000 [IU] | INTRAMUSCULAR | Status: DC | PRN
  Filled 2020-10-14: qty 1

## 2020-10-14 MED ORDER — SODIUM CHLORIDE 0.9 % IV SOLN: 100.0000 mL | INTRAVENOUS | Status: DC | PRN

## 2020-10-14 MED ORDER — DARBEPOETIN ALFA 60 MCG/0.3ML IJ SOSY
PREFILLED_SYRINGE | INTRAMUSCULAR | Status: AC
  Administered 2020-10-14: 60 ug via INTRAVENOUS
  Filled 2020-10-14: qty 0.3

## 2020-10-14 MED ORDER — HYDROMORPHONE HCL 1 MG/ML IJ SOLN
INTRAMUSCULAR | Status: AC
  Administered 2020-10-14: 0.5 mg via INTRAVENOUS
  Filled 2020-10-14: qty 0.5

## 2020-10-14 MED ORDER — HEPARIN SODIUM (PORCINE) 1000 UNIT/ML IJ SOLN
INTRAMUSCULAR | Status: AC
  Filled 2020-10-14: qty 1

## 2020-10-14 MED ORDER — PENTAFLUOROPROP-TETRAFLUOROETH EX AERO: 1.0000 "application " | INHALATION_SPRAY | CUTANEOUS | Status: DC | PRN

## 2020-10-14 MED ORDER — LIDOCAINE HCL (PF) 1 % IJ SOLN
5.0000 mL | INTRAMUSCULAR | Status: DC | PRN
  Filled 2020-10-14: qty 5

## 2020-10-14 MED ORDER — HYDRALAZINE HCL 20 MG/ML IJ SOLN
INTRAMUSCULAR | Status: AC
  Filled 2020-10-14: qty 1

## 2020-10-14 NOTE — Plan of Care (Signed)

## 2020-10-14 NOTE — Progress Notes (Signed)
[facility] NEPHROLOGY PROGRESS NOTE  Assessment/ Plan: Pt is a 80 y.o. yo male  with history of hypertension, DM, HLD, nephrolithiasis, ESRD on HD MWF at [facility] followed by Dr. Theador Hawthorne, transferred from [facility] for displaced femoral neck fracture, seen as a consultation for the management of ESRD.  #Right femoral neck fracture: Seen by orthopedics and underwent right total hip arthroplasty on 6/29.  PT OT and rehab per primary and orthopedics team.    # ESRD: MWF at [facility].  Plan for next dialysis today on 7/4.  Right IJ TDC for the access.   # Hypertension: On antihypertensive.  Volume management by ultrafiltration.   # Anemia of ESRD: Hemoglobin stable.  Need outpatient record about ESA treatment.  Iron saturation 10%--iv iron and esa ordered. Monitor hemoglobin, transfused for hgb <7   # Metabolic Bone Disease: Calcium and phosphorus level at goal.  #Altered mental status: Seen by neurology.  MRI brain with chronic microvascular ischemic changes and chronic pontine infarct without evidence of acute finding.   He looks alert awake and around his baseline.  #Hyponatremia/hypokalemia: Managed by dialysis.   Subjective: Seen and examined at bedside.  No acute events/complaints. Able to walk more. Objective Vital signs in last 24 hours: Vitals:   10/13/20 0759 10/13/20 1604 10/13/20 2315 10/14/20 0732  BP: (!) 156/58 (!) 172/75 (!) 150/52 (!) 145/62  Pulse: 61 66 60 60  Resp: 17  18 17   Temp: 98.7 F (37.1 C) 99.5 F (37.5 C) 98.6 F (37 C) 99.6 F (37.6 C)  TempSrc: Oral Oral Oral Oral  SpO2: 100% 100% 100% 100%  Weight:      Height:       Weight change:   Intake/Output Summary (Last 24 hours) at 10/14/2020 1003 Last data filed at 10/14/2020 0800 Gross per 24 hour  Intake 240 ml  Output 475 ml  Net -235 ml       Labs: Basic Metabolic Panel: Recent Labs  Lab 10/12/20 0200 10/13/20 0215 10/14/20 0133  NA 131* 129*  129*  K 3.3* 3.9 4.4  CL 96* 96* 97*  CO2 27 24 25   GLUCOSE 202* 244* 232*  BUN 20 45* 63*  CREATININE 3.24* 5.10* 6.20*  CALCIUM 8.0* 8.0* 8.1*  PHOS 3.5 5.1* 5.2*   Liver Function Tests: Recent Labs  Lab 10/12/20 0200 10/13/20 0215 10/14/20 0133  AST 18 16 12*  ALT 9 9 7   ALKPHOS 32* 32* 31*  BILITOT 0.7 0.7 0.5  PROT 5.4* 5.6* 5.6*  ALBUMIN 2.3* 2.3* 2.3*   No results for input(s): LIPASE, AMYLASE in the last 168 hours. Recent Labs  Lab 10/10/20 1236  AMMONIA 21   CBC: Recent Labs  Lab 10/10/20 0324 10/11/20 0442 10/12/20 0200 10/13/20 0215 10/14/20 0133  WBC 11.7* 11.4* 10.4 9.9 9.5  NEUTROABS  --  8.9* 8.1* 7.7 7.4  HGB 9.7* 9.1* 8.4* 8.4* 8.3*  HCT 30.5* 27.9* 26.0* 25.7* 24.8*  MCV 96.5 94.6 94.9 94.1 93.2  PLT 196 211 201 207 275   Cardiac Enzymes: No results for input(s): CKTOTAL, CKMB, CKMBINDEX, TROPONINI in the last 168 hours. CBG: Recent Labs  Lab 10/13/20 0659 10/13/20 1136 10/13/20 1641 10/13/20 2307 10/14/20 0629  GLUCAP 220* 220* 212* 182* 219*    Iron Studies:  Recent Labs    10/13/20 0215  IRON 14*  TIBC 147*  FERRITIN 327   Studies/Results: No results found.  Medications: Infusions:  ferric gluconate (FERRLECIT) IVPB  Scheduled Medications:  (feeding supplement) PROSource Plus  30 mL Oral TID BM   aspirin EC  325 mg Oral Q breakfast   carvedilol  6.25 mg Oral BID WC   chlorhexidine  60 mL Topical Once   Chlorhexidine Gluconate Cloth  6 each Topical Q0600   Chlorhexidine Gluconate Cloth  6 each Topical Q0600   cholecalciferol  1,000 Units Oral Daily   cloNIDine  0.2 mg Oral TID   darbepoetin (ARANESP) injection - DIALYSIS  60 mcg Intravenous Q Mon-HD   heparin injection (subcutaneous)  5,000 Units Subcutaneous Q8H   hydrALAZINE  50 mg Oral BID   insulin aspart  0-6 Units Subcutaneous TID WC   insulin detemir  8 Units Subcutaneous QHS   isoniazid  300 mg Oral Daily   multivitamin  1 tablet Oral QHS    polyethylene glycol  17 g Oral BID   povidone-iodine  2 application Topical Once   povidone-iodine  2 application Topical Once   pyridOXINE  50 mg Oral Daily   rosuvastatin  10 mg Oral Daily   senna-docusate  1 tablet Oral BID    have reviewed scheduled and prn medications.  Physical Exam: General: Looks comfortable, not in distress Heart:RRR, s1s2 nl Lungs: Clear b/l, no crackle Abdomen:soft, Non-tender, non-distended Extremities:No LE edema Dialysis Access: Right IJ TDC.  Trevor [name] 10/14/2020,10:03 AM  LOS: 6 days

## 2020-10-14 NOTE — Progress Notes (Signed)
PROGRESS NOTE    Trevor [name]  POE:[id number] DOB: 1940-05-05 DOA: 10/08/2020 PCP: Celene Squibb, MD   Brief Narrative:  The patient is a 80 year old obese African-American male with a past medical history significant for but not limited to end-stage renal disease on hemodialysis Monday Wednesday Friday, controlled diabetes mellitus type 2 as well as other comorbidities who presented to ED with hip pain and inability ambulate for about a month.  He went to the [facility] to get hemodialysis port replacement for 12 dizzy and fell.  At the [facility] ED he received a knee x-ray which revealed no injury.  After port replacement discharge patient to experience severe right hip pain rendering him unable to walk for about a month.  He saw Dr. Patrick Jupiter Of orthopedic surgery who ordered a hip x-ray which revealed a displaced femoral neck fracture.  Patient was needing surgery and transferred to [facility] for complex anesthesia with dialysis and orthopedic surgery and nephrology was consulted.  Patient is currently going to go for a right hip total arthroplasty from anterior approach and this was done 10/09/20.  Today he went for dialysis and tolerated it well but when he came back he was significantly confused. He was given Narcotics in dialysis. Narcan was given and did not change the patient's Mental Status. Neurology consulted and recommending MRI given his asterixis and poly myoclonus. Ammonia and ABG were also ordered. BP was elevated so was given Hydralazine   MRI Done and showed no acute CVA but did show chronic Pontine Infarct and Chronic White Matter Disease.  Neurology recommending checking a B12 and a TSH and treat if abnormal but both were normal. Social Work assisting with disposition and placement and now PT Recommending CIR so will place Rehab consult for assessment and evaluation.   Assessment & Plan:   Principal Problem:   Hip fracture ([place]) Active Problems:   Diabetes mellitus with stage 5  chronic kidney disease (HCC)   Anemia   Gastroesophageal reflux disease   Hyperlipidemia   Neuropathy   End stage renal disease (HCC)   Hyponatremia   Hyperglycemia   Hip pain  Right Closed Displaced Femoral Neck Fracture Acute Hip Pain -Confirmed on imaging on 10/08/20 as an outpatient  -Orthopedic Surgery was consulted and underwent Right Total Hip Arthroplasty Anterior Approch on 10/09/20 -VTE Prophylaxis per Orthopedic Surgery and they are recommending aspirin, SCDs and teds -WB Status per Ortho and they are recommending weightbearing as tolerated with walker -PT/OT to evaluate and Treat and now recommending CIR; Initially recommended SNF -Pain Control with Hydrocodone-Acetaminophen 1-2 tab\ q6hprn Severe Pain, and IV Hydromorphone 0.5 mg q3hprn Severe Pain but may need to be more Judicious given Confusion and Reduced Concentration and Twitching  -Bowel Regimen Post-Op but has Miralax 17 g po Daily PRN and Docusate 100 mg po BID but will change given Constipation -Ortho Recommending Advancing Diet and Up with Therapy   Constipation -Bowel Regimen Post-Op but has Miralax 17 g po Daily PRN and Docusate 100 mg po BID but will change given Constipation -Will change Miralax to 17 grams po BID and Change Docusate to Senna-Docusate 1 tab po BID -Also will add Bisacodyl Suppository 10 mg RC -IF necessary will give Enema    DM  in setting of ESRD  -stable, last A1c was 6.2 -POC glucose was in 300s initially  -SSI Novolog 0-6 units TID with meals -Continue insulin detemir 8 units qhs but may adjust further -Continue to Monitor CBG's per Protocol;  CBGs now ranging from 182-226 -Consult diabetes education coordinator for further assistance with blood sugar management -Blood sugar on this morning's CMP was 232   ESRD on HD MWF Metabolic Acidosis Hyperphosphatemia -Creatinine 6-7 at baseline.  -Patient's BUN/Cr went from 44/4.44 -> 51/5.05 -> 54/5.53 -> 31/4.45 -> 20/3.24 -> 45/5.10 ->  63/6.20 -Patient's Phos Level went from 4.7 -> 3.5 -> 5.1 -> 5.2 -Patient had a Metabolic Acidosis this this is improved as CO2 is 24, AG is 9, and Chloride Level is 96 -Continiue HDS as scheduled (M, W, F) -Vit D3 1000 units -Nephrology evaluated and he underwent twice (Last session yesterdy with 1 Liter Ultrafiltration) with next session scheduled for today 10/14/20   Hypokalemia -Mild at 3.3 and improved to 4.4 -Further management per Dialysis session -Continue to Monitor and Replete as Necessary -Repeat CMP in the AM    Hypertension  -Patient took home meds this AM (coreg, clonidine, statin). BP systolic 532D in ED in setting of severe pain. Improved with hydralazine 10mg  IV -C/w Carvedilol 6.25 mg bid PO -C/w Hydralazine 50 mg bid PO -C/w Clonidine 0.2 mg TID -C/w PRN Hydralazine  -Continue to Monitor BP per Protocols; Last BP was 145/62  Confusion/Encephalopathy and Polymyoclonus, improved and back to baseline -Likely Medication induced -Patient could not follow commands and would not communicate with the Nurse after Dialysis -Spoke with Neurology who recommended calling a CODE Stroke initially but then cancelled -Given Narcan and obtained ABG -ABG    Component Value Date/Time   PHART 7.428 10/10/2020 1250   PCO2ART 36.6 10/10/2020 1250   PO2ART 107 10/10/2020 1250   HCO3 23.0 10/10/2020 1250   TCO2 23 11/30/2019 0619   ACIDBASEDEF 0.3 10/10/2020 1250   O2SAT 98.0 10/10/2020 1250  -Was going to get a Stat Head CT but Neuro recommended changing to MRI of Brain; MRI Brain showed "No evidence of recent infarction, hemorrhage, or mass. Chronic microvascular ischemic changes.  Chronic pontine infarct." -Ammonia Level was 21 -Neurology formally consulted and now recommending checking B12 and TSH both of which were normal. -Patient's Mental Status has improved to Baseline   HLD -Rosuvastatin 10 mg   Hyponatremia  -In setting of ESRD and volume overload -Na+ 132 on Admission  and today was 129 again -Continue hemodialysis as scheduled and likley to be correct in Dialysis -Repeat CMP in the AM   HLD -C/w Rosuvastatin 10 mg po Daily   Anemia of chronic disease  -Baseline Hgb ~8.5-9. Admission was 9.9 -Hgb/Hct went from 9.9/30.2 -> 10.0/31.0 -> 9.7/30.5 -> 9.1/27.9 -> 8.4/26.0 -> 8.4/25.7 -> 8.3/24.8 -As Expected had a Drop Post Operatively -Nephrology planning on ordering IV Iron with Dialysis  -Checked Anemia Panel and showed iron level of 13, U IBC of 133, TIBC 147, saturation ratios of 10%, ferritin of 327, folate level 12.3, vitamin B12 256 -Continue to Monitor For S/Sx of Bleeding; No overt Bleeding noted -Repeat CBC in the AM   Latent TB -C/w Isoniazide 300 mg po Daily and Pyridoxine 50 mg   Obesity -Complicates overall Prognosis and Care -Estimated body mass index is 29.83 kg/m as calculated from the following:   Height as of this encounter: 5\' 11"  (1.803 m).   Weight as of this encounter: 97 kg. -Weight Loss and Dietary Counseling given -Dietary evaluated and recommending 30 mL of Prosource plus 3 times daily as well as renal multivitamin daily  Leukocytosis -Likely in the setting of Surgical Intervention -Patient's WBC went from 7.9 -> 11.7 -> 11.4 ->  10.4 -> 9.9 -> 9.5 -Continue to Monitor and Trend  -Repeat CBC in the AM   DVT prophylaxis: Heparin 5,000 units sq q8h Code Status: FULL CODE  Family Communication: No Family present at bedside Disposition Plan: Pending further clearance by Ortho and Nephrology and Evaluation by PT/OT; Now PT/OT Recommending CIR and will D/C pending Insurance Authorization and bed availability   Status is: Inpatient  Remains inpatient appropriate because:Unsafe d/c plan, IV treatments appropriate due to intensity of illness or inability to take PO, and Inpatient level of care appropriate due to severity of illness  Dispo: The patient is from: Home              Anticipated d/c is to: SNF               Patient currently is not medically stable to d/c.   Difficult to place patient No  Consultants:  Orthopedic Surgery Nephrology  Neurology   Procedures: Right Total Hip Arthroplasty Anterior Approach on 10/09/20 by Dr. Lyla Glassing   Antimicrobials:  Anti-infectives (From admission, onward)    Start     Dose/Rate Route Frequency Ordered Stop   10/10/20 0600  ceFAZolin (ANCEF) IVPB 2g/100 mL premix  Status:  Discontinued        2 g 200 mL/hr over 30 Minutes Intravenous On call to O.R. 10/09/20 2106 10/09/20 2130   10/09/20 2200  ceFAZolin (ANCEF) IVPB 2g/100 mL premix  Status:  Discontinued        2 g 200 mL/hr over 30 Minutes Intravenous Every 6 hours 10/09/20 2109 10/10/20 0036   10/09/20 1000  isoniazid (NYDRAZID) tablet 300 mg        300 mg Oral Daily 10/08/20 1629     10/09/20 0830  ceFAZolin (ANCEF) IVPB 2g/100 mL premix        2 g 200 mL/hr over 30 Minutes Intravenous On call to O.R. 10/09/20 1448 10/09/20 1502        Subjective: Seen and examined at bedside and he was doing ok. States his pain level was a 5/10 but he took a Pain pill earlier this AM. Ambulating and states he walked from the bathroom door to the Room Door. Does not know when he will go for dialysis. Still has not had a bowel movement yet but took Miralax this AM. No Nausea or vomiting. No other concerns or complaints at this time.   Objective: Vitals:   10/13/20 0759 10/13/20 1604 10/13/20 2315 10/14/20 0732  BP: (!) 156/58 (!) 172/75 (!) 150/52 (!) 145/62  Pulse: 61 66 60 60  Resp: 17  18 17   Temp: 98.7 F (37.1 C) 99.5 F (37.5 C) 98.6 F (37 C) 99.6 F (37.6 C)  TempSrc: Oral Oral Oral Oral  SpO2: 100% 100% 100% 100%  Weight:      Height:        Intake/Output Summary (Last 24 hours) at 10/14/2020 1856 Last data filed at 10/14/2020 0800 Gross per 24 hour  Intake 360 ml  Output 475 ml  Net -115 ml    Filed Weights   10/08/20 2300 10/10/20 0740 10/10/20 1123  Weight: 109.8 kg 98.3 kg 97 kg    Examination:  Physical Exam:  Constitutional: WN/WD overweight AAM in NAD and appears calm and comfortable Eyes: Lids and conjunctivae normal, sclerae anicteric  ENMT: External Ears, Nose appear normal. Grossly normal hearing. Neck: Appears normal, supple, no cervical masses, normal ROM, no appreciable thyromegaly; no JVD Respiratory: Diminished to auscultation bilaterally, no wheezing,  rales, rhonchi or crackles. Normal respiratory effort and patient is not tachypenic. No accessory muscle use. Unlabored breathing  Cardiovascular: RRR, no murmurs / rubs / gallops. S1 and S2 auscultated. Trace LE edema Abdomen: Soft, non-tender, Non-distended. Bowel sounds positive.  GU: Deferred. Musculoskeletal: No clubbing / cyanosis of digits/nails. No joint deformity upper and lower extremities.  Skin: No rashes, lesions, ulcers on a limited skin evaluation. No induration; Warm and dry.  Neurologic: CN 2-12 grossly intact with no focal deficits. Romberg sign and cerebellar reflexes not assessed.  Psychiatric: Normal judgment and insight. Alert and oriented x 3. Not as withdrawn today and had a normal mood and appropriate affect.   Data Reviewed: I have personally reviewed following labs and imaging studies  CBC: Recent Labs  Lab 10/09/20 1011 10/10/20 0324 10/11/20 0442 10/12/20 0200 10/13/20 0215 10/14/20 0133  WBC 7.9 11.7* 11.4* 10.4 9.9 9.5  NEUTROABS 6.3  --  8.9* 8.1* 7.7 7.4  HGB 10.0* 9.7* 9.1* 8.4* 8.4* 8.3*  HCT 31.0* 30.5* 27.9* 26.0* 25.7* 24.8*  MCV 94.5 96.5 94.6 94.9 94.1 93.2  PLT 247 196 211 201 207 048    Basic Metabolic Panel: Recent Labs  Lab 10/09/20 1011 10/10/20 0324 10/11/20 0442 10/12/20 0200 10/13/20 0215 10/14/20 0133  NA  --  134* 133* 131* 129* 129*  K  --  4.5 3.6 3.3* 3.9 4.4  CL  --  101 97* 96* 96* 97*  CO2  --  21* 27 27 24 25   GLUCOSE  --  213* 195* 202* 244* 232*  BUN  --  54* 31* 20 45* 63*  CREATININE  --  5.53* 4.45* 3.24* 5.10* 6.20*   CALCIUM  --  8.7* 8.4* 8.0* 8.0* 8.1*  MG 1.8  --  1.7 1.7 1.8 1.7  PHOS 4.4 5.1* 4.7* 3.5 5.1* 5.2*    GFR: Estimated Creatinine Clearance: 11.5 mL/min (A) (by C-G formula based on SCr of 6.2 mg/dL (H)). Liver Function Tests: Recent Labs  Lab 10/10/20 0324 10/11/20 0442 10/12/20 0200 10/13/20 0215 10/14/20 0133  AST  --  18 18 16  12*  ALT  --  10 9 9 7   ALKPHOS  --  35* 32* 32* 31*  BILITOT  --  0.8 0.7 0.7 0.5  PROT  --  6.0* 5.4* 5.6* 5.6*  ALBUMIN 3.0* 2.6* 2.3* 2.3* 2.3*    No results for input(s): LIPASE, AMYLASE in the last 168 hours. Recent Labs  Lab 10/10/20 1236  AMMONIA 21    Coagulation Profile: Recent Labs  Lab 10/08/20 1224  INR 1.1    Cardiac Enzymes: No results for input(s): CKTOTAL, CKMB, CKMBINDEX, TROPONINI in the last 168 hours. BNP (last 3 results) No results for input(s): PROBNP in the last 8760 hours. HbA1C: No results for input(s): HGBA1C in the last 72 hours.  CBG: Recent Labs  Lab 10/13/20 0659 10/13/20 1136 10/13/20 1641 10/13/20 2307 10/14/20 0629  GLUCAP 220* 220* 212* 182* 219*    Lipid Profile: No results for input(s): CHOL, HDL, LDLCALC, TRIG, CHOLHDL, LDLDIRECT in the last 72 hours. Thyroid Function Tests: Recent Labs    10/12/20 0200  TSH 2.373    Anemia Panel: Recent Labs    10/12/20 0200 10/13/20 0215  VITAMINB12 229 256  FOLATE  --  12.3  FERRITIN  --  327  TIBC  --  147*  IRON  --  14*  RETICCTPCT  --  2.2    Sepsis Labs: No results for input(s): PROCALCITON, LATICACIDVEN  in the last 168 hours.  Recent Results (from the past 240 hour(s))  Resp Panel by RT-PCR (Flu A&B, Covid) Nasopharyngeal Swab     Status: None   Collection Time: 10/08/20  1:44 PM   Specimen: Nasopharyngeal Swab; Nasopharyngeal(NP) swabs in vial transport medium  Result Value Ref Range Status   SARS Coronavirus 2 by RT PCR NEGATIVE NEGATIVE Final    Comment: (NOTE) SARS-CoV-2 target nucleic acids are NOT DETECTED.  The  SARS-CoV-2 RNA is generally detectable in upper respiratory specimens during the acute phase of infection. The lowest concentration of SARS-CoV-2 viral copies this assay can detect is 138 copies/mL. A negative result does not preclude SARS-Cov-2 infection and should not be used as the sole basis for treatment or other patient management decisions. A negative result may occur with  improper specimen collection/handling, submission of specimen other than nasopharyngeal swab, presence of viral mutation(s) within the areas targeted by this assay, and inadequate number of viral copies(<138 copies/mL). A negative result must be combined with clinical observations, patient history, and epidemiological information. The expected result is Negative.  Fact Sheet for Patients:  EntrepreneurPulse.com.au  Fact Sheet for Healthcare Providers:  IncredibleEmployment.be  This test is no t yet approved or cleared by the [place] FDA and  has been authorized for detection and/or diagnosis of SARS-CoV-2 by FDA under an Emergency Use Authorization (EUA). This EUA will remain  in effect (meaning this test can be used) for the duration of the COVID-19 declaration under Section 564(b)(1) of the Act, 21 U.S.C.section 360bbb-3(b)(1), unless the authorization is terminated  or revoked sooner.       Influenza A by PCR NEGATIVE NEGATIVE Final   Influenza B by PCR NEGATIVE NEGATIVE Final    Comment: (NOTE) The Xpert Xpress SARS-CoV-2/FLU/RSV plus assay is intended as an aid in the diagnosis of influenza from Nasopharyngeal swab specimens and should not be used as a sole basis for treatment. Nasal washings and aspirates are unacceptable for Xpert Xpress SARS-CoV-2/FLU/RSV testing.  Fact Sheet for Patients: EntrepreneurPulse.com.au  Fact Sheet for Healthcare Providers: IncredibleEmployment.be  This test is not yet approved or  cleared by the [place] FDA and has been authorized for detection and/or diagnosis of SARS-CoV-2 by FDA under an Emergency Use Authorization (EUA). This EUA will remain in effect (meaning this test can be used) for the duration of the COVID-19 declaration under Section 564(b)(1) of the Act, 21 U.S.C. section 360bbb-3(b)(1), unless the authorization is terminated or revoked.  Performed at [facility], [address] [place], [place] [address]   Surgical pcr screen     Status: None   Collection Time: 10/09/20  3:58 AM   Specimen: Nasal Mucosa; Nasal Swab  Result Value Ref Range Status   MRSA, PCR NEGATIVE NEGATIVE Final   Staphylococcus aureus NEGATIVE NEGATIVE Final    Comment: (NOTE) The Xpert SA Assay (FDA approved for NASAL specimens in patients 92 years of age and older), is one component of a comprehensive surveillance program. It is not intended to diagnose infection nor to guide or monitor treatment. Performed at [facility], [place] [address], [place], [place] [address]       RN Pressure Injury Documentation:     Estimated body mass index is 29.83 kg/m as calculated from the following:   Height as of this encounter: 5\' 11"  (1.803 m).   Weight as of this encounter: 97 kg.  Malnutrition Type:  Malnutrition Characteristics: Signs/Symptoms: estimated needs Nutrition Interventions: Interventions: MVI, Prostat  Radiology Studies: No results found.  Scheduled Meds:  (feeding supplement) PROSource Plus  30 mL Oral TID BM   aspirin EC  325 mg Oral Q breakfast   carvedilol  6.25 mg Oral BID WC   chlorhexidine  60 mL Topical Once   Chlorhexidine Gluconate Cloth  6 each Topical Q0600   Chlorhexidine Gluconate Cloth  6 each Topical Q0600   cholecalciferol  1,000 Units Oral Daily   cloNIDine  0.2 mg Oral TID   darbepoetin (ARANESP) injection - DIALYSIS  60 mcg Intravenous Q Mon-HD   heparin injection (subcutaneous)  5,000 Units Subcutaneous Q8H    hydrALAZINE  50 mg Oral BID   insulin aspart  0-6 Units Subcutaneous TID WC   insulin detemir  8 Units Subcutaneous QHS   isoniazid  300 mg Oral Daily   multivitamin  1 tablet Oral QHS   polyethylene glycol  17 g Oral BID   povidone-iodine  2 application Topical Once   povidone-iodine  2 application Topical Once   pyridOXINE  50 mg Oral Daily   rosuvastatin  10 mg Oral Daily   senna-docusate  1 tablet Oral BID   Continuous Infusions:  ferric gluconate (FERRLECIT) IVPB       LOS: 6 days   Kerney Elbe, DO [facility] PAGER is on AMION  If 7PM-7AM, please contact night-coverage [email or web address]

## 2020-10-14 NOTE — Progress Notes (Signed)
OT Cancellation Note  Patient Details Name: Trevor [name] MRN: [id number] DOB: 1940-10-22   Cancelled Treatment:    Reason Eval/Treat Not Completed: Patient at procedure or test/ unavailable (Pt at HD at this time. OT to attempt later today or tomorrow as schedule allows.)  Jefferey Pica, OTR/L [place] Pager: [phone] Office: [phone]   Audrielle Vankuren C 10/14/2020, 1:47 PM

## 2020-10-14 NOTE — Progress Notes (Signed)
Physical Therapy Treatment Patient Details Name: Trevor [name] MRN: [id number] DOB: 07-Nov-1940 Today's Date: 10/14/2020    History of Present Illness 80 yo male sustained a fall coming to hosp for placement of HD catheter, sustained a fall on 09/07/20.  Did not get imaging on his hip until later, found R femoral neck fracture.  Now has R THA with direct anterior approach from 6/29, referred to PT.  PMHx:  ESRD on HD, anemia, CKD, DM, HLD, HTN, kidney stones    PT Comments    Pt reports he has been mobilizing more with RN staff over the last day, reports mild to moderate post-operative pain. Pt ambulatory for room distance, overall requiring min assist and max cuing for safe mobility. Pt tolerated THA exercises well, request return to bed in preparation for HD later this am. Will continue to follow.   Follow Up Recommendations  CIR     Equipment Recommendations  Rolling walker with 5" wheels    Recommendations for Other Services Rehab consult     Precautions / Restrictions Precautions Precautions: Fall Restrictions Weight Bearing Restrictions: No RLE Weight Bearing: Weight bearing as tolerated    Mobility  Bed Mobility Overal bed mobility: Needs Assistance Bed Mobility: Sit to Supine       Sit to supine: Min assist;HOB elevated   General bed mobility comments: min assist for RLE lifting into bed, pt requesting HOB elevated prior to laying down.    Transfers Overall transfer level: Needs assistance Equipment used: Rolling walker (2 wheeled) Transfers: Sit to/from Stand Sit to Stand: Min assist         General transfer comment: min assist for initial power up and steadying upon standing, VC for hand placement when rising/sitting.  Ambulation/Gait Ambulation/Gait assistance: Min guard Gait Distance (Feet): 35 Feet Assistive device: Rolling walker (2 wheeled) Gait Pattern/deviations: Step-through pattern;Decreased stride length;Trunk flexed;Antalgic;Decreased  weight shift to right;Decreased dorsiflexion - right Gait velocity: decr   General Gait Details: close guard for safety, verbal cuing for upright posture, placement in RW, sequencing with RLE leading to offweight as needed, increasing RLE clearance during swing phase.   Stairs             Wheelchair Mobility    Modified Rankin (Stroke Patients Only)       Balance Overall balance assessment: Needs assistance;History of Falls Sitting-balance support: No upper extremity supported;Feet supported Sitting balance-Leahy Scale: Good       Standing balance-Leahy Scale: Fair                              Cognition Arousal/Alertness: Awake/alert Behavior During Therapy: WFL for tasks assessed/performed Overall Cognitive Status: Within Functional Limits for tasks assessed                                        Exercises Total Joint Exercises Ankle Circles/Pumps: AROM;Both;10 reps;Supine Short Arc Quad: AROM;Right;10 reps;Supine Heel Slides: AROM;Right;10 reps;Supine Hip ABduction/ADduction: Right;10 reps;AAROM;Supine    General Comments        Pertinent Vitals/Pain Pain Assessment: Faces Faces Pain Scale: Hurts little more Pain Location: R hip Pain Descriptors / Indicators: Operative site guarding;Sore Pain Intervention(s): Limited activity within patient's tolerance;Monitored during session;Repositioned    Home Living  Prior Function            PT Goals (current goals can now be found in the care plan section) Acute Rehab PT Goals Patient Stated Goal: None stated PT Goal Formulation: With patient Time For Goal Achievement: 10/24/20 Potential to Achieve Goals: Good Progress towards PT goals: Progressing toward goals    Frequency    Min 4X/week      PT Plan Current plan remains appropriate    Co-evaluation              AM-PAC PT "6 Clicks" Mobility   Outcome Measure  Help needed  turning from your back to your side while in a flat bed without using bedrails?: A Little Help needed moving from lying on your back to sitting on the side of a flat bed without using bedrails?: A Little Help needed moving to and from a bed to a chair (including a wheelchair)?: A Little Help needed standing up from a chair using your arms (e.g., wheelchair or bedside chair)?: A Little Help needed to walk in hospital room?: A Little Help needed climbing 3-5 steps with a railing? : A Lot 6 Click Score: 17    End of Session Equipment Utilized During Treatment: Gait belt Activity Tolerance: Patient tolerated treatment well Patient left: with call bell/phone within reach;in bed;with bed alarm set Nurse Communication: Mobility status PT Visit Diagnosis: Unsteadiness on feet (R26.81);Muscle weakness (generalized) (M62.81);Pain;Difficulty in walking, not elsewhere classified (R26.2);Repeated falls (R29.6);History of falling (Z91.81) Pain - Right/Left: Right Pain - part of body: Hip     Time: 1001-1016 PT Time Calculation (min) (ACUTE ONLY): 15 min  Charges:  $Gait Training: 8-22 mins                     Stacie Glaze, PT DPT Acute Rehabilitation Services Pager [phone]  Office [phone]    [place] 10/14/2020, 12:33 PM

## 2020-10-15 LAB — COMPREHENSIVE METABOLIC PANEL
ALT: 8 U/L (ref 0–44)
AST: 15 U/L (ref 15–41)
Albumin: 2.3 g/dL — ABNORMAL LOW (ref 3.5–5.0)
Alkaline Phosphatase: 29 U/L — ABNORMAL LOW (ref 38–126)
Anion gap: 10 (ref 5–15)
BUN: 40 mg/dL — ABNORMAL HIGH (ref 8–23)
CO2: 26 mmol/L (ref 22–32)
Calcium: 8.1 mg/dL — ABNORMAL LOW (ref 8.9–10.3)
Chloride: 95 mmol/L — ABNORMAL LOW (ref 98–111)
Creatinine, Ser: 4.48 mg/dL — ABNORMAL HIGH (ref 0.61–1.24)
GFR, Estimated: 13 mL/min — ABNORMAL LOW (ref >60)
Glucose, Bld: 236 mg/dL — ABNORMAL HIGH (ref 70–99)
Potassium: 4.1 mmol/L (ref 3.5–5.1)
Sodium: 131 mmol/L — ABNORMAL LOW (ref 135–145)
Total Bilirubin: 0.4 mg/dL (ref 0.3–1.2)
Total Protein: 5.5 g/dL — ABNORMAL LOW (ref 6.5–8.1)

## 2020-10-15 LAB — CBC WITH DIFFERENTIAL/PLATELET
Abs Immature Granulocytes: 0.03 10*3/uL (ref 0.00–0.07)
Basophils Absolute: 0.1 10*3/uL (ref 0.0–0.1)
Basophils Relative: 1 %
Eosinophils Absolute: 0.1 10*3/uL (ref 0.0–0.5)
Eosinophils Relative: 2 %
HCT: 24.4 % — ABNORMAL LOW (ref 39.0–52.0)
Hemoglobin: 7.8 g/dL — ABNORMAL LOW (ref 13.0–17.0)
Immature Granulocytes: 0 %
Lymphocytes Relative: 11 %
Lymphs Abs: 0.8 10*3/uL (ref 0.7–4.0)
MCH: 30 pg (ref 26.0–34.0)
MCHC: 32 g/dL (ref 30.0–36.0)
MCV: 93.8 fL (ref 80.0–100.0)
Monocytes Absolute: 0.8 10*3/uL (ref 0.1–1.0)
Monocytes Relative: 11 %
Neutro Abs: 5.6 10*3/uL (ref 1.7–7.7)
Neutrophils Relative %: 75 %
Platelets: 254 10*3/uL (ref 150–400)
RBC: 2.6 MIL/uL — ABNORMAL LOW (ref 4.22–5.81)
RDW: 14.8 % (ref 11.5–15.5)
WBC: 7.4 10*3/uL (ref 4.0–10.5)
nRBC: 0 % (ref 0.0–0.2)

## 2020-10-15 LAB — GLUCOSE, CAPILLARY
Glucose-Capillary: 160 mg/dL — ABNORMAL HIGH (ref 70–99)
Glucose-Capillary: 186 mg/dL — ABNORMAL HIGH (ref 70–99)
Glucose-Capillary: 179 mg/dL — ABNORMAL HIGH (ref 70–99)
Glucose-Capillary: 178 mg/dL — ABNORMAL HIGH (ref 70–99)

## 2020-10-15 LAB — MAGNESIUM: Magnesium: 1.9 mg/dL (ref 1.7–2.4)

## 2020-10-15 LAB — PHOSPHORUS: Phosphorus: 4.1 mg/dL (ref 2.5–4.6)

## 2020-10-15 NOTE — Progress Notes (Signed)
Nutrition Follow-up  DOCUMENTATION CODES:   Obesity unspecified  INTERVENTION:   -D/c Prosource Plus  -Renal MVI daily -Double protein portions with meals -Recommend bowel regimen (last BM 10/09/20); discussed with MD  NUTRITION DIAGNOSIS:   Increased nutrient needs related to post-op healing as evidenced by estimated needs.  Ongoing  GOAL:   Patient will meet greater than or equal to 90% of their needs  Progressing  MONITOR:   PO intake, Supplement acceptance, Diet advancement, Labs, Weight trends, Skin, I & O's  REASON FOR ASSESSMENT:   Consult Assessment of nutrition requirement/status, Hip fracture protocol  ASSESSMENT:   Trevor [name] is a 80 yo male with pertinent medical history of ESRD on dialysis and controlled DM presenting to the ED with hip pain and inability to ambulate x1 month. Patient went to [facility] to get hemodialysis port replacement but felt dizzy and fell. At [facility] ED patient received a knee xray which revealed no injury. After port replacement and discharge, patient continued to experience severe R hip pain rendering him unable to walk for a month. He saw Dr. Sanjuana Kava of orthopedic surgery today who ordered a hip XR which revealed displaced femoral neck fracture. Patient needs surgery and will be transferred to [facility] for complex anesthesia with dialysis. Patient received dialysis three times a week.  6/29- s/p Right total hip arthroplasty, anterior approach.  Reviewed I/O's: -1.5 L x 24 hours and -3.2 L since admission  UOP: 500 ml x 24 hours   Pt receiving nursing care at time of visit.   Pt with good appetite. Noted meal completion 100%. Pt is intermittently accepting of Prosource supplements..   Per TOC notes, awaiting insurance authorization for SNF.  Medications reviewed and include vitamin D3, aranesp, miralax, vitamin B-6, and senokot.   Labs reviewed: Na: 131, CBGS: 160-186 (inpatient orders for glycemic control are 0-6 units insulin aspart  TID with meals and 8 units insulin detemir daily).    Diet Order:   Diet Order             Diet Carb Modified Fluid consistency: Thin; Room service appropriate? Yes  Diet effective now                   EDUCATION NEEDS:   Education needs have been addressed  Skin:  Skin Assessment: Skin Integrity Issues: Skin Integrity Issues:: Incisions Incisions: closed rt hip  Last BM:  10/09/20  Height:   Ht Readings from Last 1 Encounters:  10/08/20 5\' 11"  (1.803 m)    Weight:   Wt Readings from Last 1 Encounters:  10/14/20 98.8 kg    Ideal Body Weight:  78.2 kg  BMI:  Body mass index is 30.38 kg/m.  Estimated Nutritional Needs:   Kcal:  2150-2350  Protein:  115-130 grams  Fluid:  1000 ml + UOP    Loistine Chance, RD, LDN, [place] Registered Dietitian II Certified Diabetes Care and Education Specialist Please refer to [facility] for RD and/or RD on-call/weekend/after hours pager

## 2020-10-15 NOTE — Plan of Care (Signed)

## 2020-10-15 NOTE — Progress Notes (Signed)
Inpatient Rehab Admissions Coordinator:   I met with pt. And wife to let them know that I have submitted a case to insurance for CIR, but have not received a response yet. I do not have a bed on CIR for him at this time but will follow for potential admit pending insurance auth.   Clemens Catholic, [place], [place] Admissions Coordinator  [phone] ([place]) [phone] (office)

## 2020-10-15 NOTE — Progress Notes (Signed)
Physical Therapy Treatment Patient Details Name: Trevor [name] MRN: [id number] DOB: [birth year] Today's Date: 10/15/2020    History of Present Illness 80 yo male sustained a fall coming to hosp for placement of HD catheter, sustained a fall on 09/07/20.  Did not get imaging on his hip until later, found R femoral neck fracture.  Now has R THA with direct anterior approach from 6/29, referred to PT.  PMHx:  ESRD on HD, anemia, CKD, DM, HLD, HTN, kidney stones    PT Comments    Pt making excellent progress. He is very motivated and has good pain control.  Pt has support at home for after rehab.  Currently needs cues for safety and min A for transfers and stability.  Continue to recommend CIR. Continue plan of care.     Follow Up Recommendations  CIR     Equipment Recommendations  Rolling walker with 5" wheels    Recommendations for Other Services Rehab consult     Precautions / Restrictions Precautions Precautions: Fall Restrictions Weight Bearing Restrictions: Yes RLE Weight Bearing: Weight bearing as tolerated    Mobility  Bed Mobility Overal bed mobility: Needs Assistance Bed Mobility: Sit to Supine       Sit to supine: Min assist;HOB elevated   General bed mobility comments: in chair at arrival    Transfers Overall transfer level: Needs assistance Equipment used: Rolling walker (2 wheeled) Transfers: Sit to/from Stand Sit to Stand: Min assist         General transfer comment: Performed x 2 with min A to steady.  Cued for hand placement with return to sitting  Ambulation/Gait Ambulation/Gait assistance: Min guard Gait Distance (Feet): 50 Feet Assistive device: Rolling walker (2 wheeled) Gait Pattern/deviations: Step-through pattern;Decreased weight shift to right;Antalgic Gait velocity: decr   General Gait Details: Min cues for RW proximity and posture   Stairs             Wheelchair Mobility    Modified Rankin (Stroke Patients Only)        Balance Overall balance assessment: Needs assistance;History of Falls Sitting-balance support: No upper extremity supported;Feet supported Sitting balance-Leahy Scale: Good     Standing balance support: Bilateral upper extremity supported Standing balance-Leahy Scale: Poor Standing balance comment: using RW and min guard                            Cognition Arousal/Alertness: Awake/alert Behavior During Therapy: WFL for tasks assessed/performed Overall Cognitive Status: Within Functional Limits for tasks assessed                                 General Comments: motivated      Exercises Total Joint Exercises Towel Squeeze: AROM;Both;10 reps;Seated General Exercises - Lower Extremity Ankle Circles/Pumps: AROM;Both;10 reps;Seated Quad Sets: AROM;Seated;Both;10 reps Long Arc Quad: AROM;Both;10 reps;Seated Heel Slides: AAROM;Right;10 reps (reclined position) Hip ABduction/ADduction: AAROM;Right;10 reps (reclined position) Other Exercises Other Exercises: Cues for correct full ROM as able for all; cues for controlled speed    General Comments General comments (skin integrity, edema, etc.): VSS      Pertinent Vitals/Pain Pain Assessment: 0-10 Faces Pain Scale: Hurts a little bit Pain Location: R hip Pain Descriptors / Indicators: Discomfort;Sore Pain Intervention(s): Limited activity within patient's tolerance;Monitored during session;Repositioned    Home Living Family/patient expects to be discharged to:: Private residence Living Arrangements: Spouse/significant other;Children Available Help  at Discharge: Family;Available 24 hours/day Type of Home: House Home Access: Ramped entrance   Home Layout: Two level;Able to live on main level with bedroom/bathroom Home Equipment: Kasandra Knudsen - single point;Wheelchair - manual Additional Comments: has not walked in six months    Prior Function Level of Independence: Needs assistance  Gait / Transfers  Assistance Needed: wheelchair to bed transfers ADL's / [place] Needed: has been getting assisted by wife Comments: Assist levels reflect post-fall; he was indep prior   PT Goals (current goals can now be found in the care plan section) Acute Rehab PT Goals Patient Stated Goal: home in 1-2 weeks PT Goal Formulation: With patient Time For Goal Achievement: 10/24/20 Potential to Achieve Goals: Good Progress towards PT goals: Progressing toward goals    Frequency    Min 4X/week      PT Plan Current plan remains appropriate    Co-evaluation              AM-PAC PT "6 Clicks" Mobility   Outcome Measure  Help needed turning from your back to your side while in a flat bed without using bedrails?: A Little Help needed moving from lying on your back to sitting on the side of a flat bed without using bedrails?: A Little Help needed moving to and from a bed to a chair (including a wheelchair)?: A Little Help needed standing up from a chair using your arms (e.g., wheelchair or bedside chair)?: A Little Help needed to walk in hospital room?: A Little Help needed climbing 3-5 steps with a railing? : A Little 6 Click Score: 18    End of Session Equipment Utilized During Treatment: Gait belt Activity Tolerance: Patient tolerated treatment well Patient left: with call bell/phone within reach;in chair Nurse Communication: Mobility status PT Visit Diagnosis: Unsteadiness on feet (R26.81);Muscle weakness (generalized) (M62.81);Pain;Difficulty in walking, not elsewhere classified (R26.2);Repeated falls (R29.6);History of falling (Z91.81) Pain - Right/Left: Right Pain - part of body: Hip     Time: 1032-1050 PT Time Calculation (min) (ACUTE ONLY): 18 min  Charges:  $Gait Training: 8-22 mins                     Abran Richard, PT Acute Rehab Services Pager [phone] [facility] [place] [facility] 10/15/2020, 10:58 AM

## 2020-10-15 NOTE — Progress Notes (Signed)
Occupational Therapy Evaluation Patient Details Name: Trevor [name] MRN: [id number] DOB: 08/31/1940 Today's Date: 10/15/2020    History of Present Illness 80 yo male sustained a fall coming to hosp for placement of HD catheter, sustained a fall on 09/07/20.  Did not get imaging on his hip until later, found R femoral neck fracture.  Now has R THA with direct anterior approach from 6/29, referred to PT.  PMHx:  ESRD on HD, anemia, CKD, DM, HLD, HTN, kidney stones   Clinical Impression   Trevor [name] was evaluated s/p the above R THA. PTA pt was completing surface tranfers and receiving max A for most ADLs; however, prior to his fall he was indep. Pt is min A overall for functional room distance ambulation with RW with 1 apparent LOB after sit<>stand. Pt is mod A overall for ADLs. Pt benefits from continued OT acutely to progress ADLs and mobility. Recommend CIR level therapies at d/c to maximize indep prior to return home.     Follow Up Recommendations  CIR    Equipment Recommendations  Other (comment) (defer to next venue)    Recommendations for Other Services Rehab consult     Precautions / Restrictions Precautions Precautions: Fall Restrictions Weight Bearing Restrictions: Yes RLE Weight Bearing: Weight bearing as tolerated      Mobility Bed Mobility Overal bed mobility: Needs Assistance Bed Mobility: Sit to Supine       Sit to supine: Min assist;HOB elevated   General bed mobility comments: heavy use of rails    Transfers Overall transfer level: Needs assistance Equipment used: Rolling walker (2 wheeled) Transfers: Sit to/from Stand Sit to Stand: Min assist         General transfer comment: pt with 1 LOB upon sit>stand and required min A to sit back onto bed - vc for hand placemnet and safety. pt attempting to pull to stand wtih rw adn ignored vc for hand positionint    Balance Overall balance assessment: Needs assistance;History of Falls Sitting-balance support:  No upper extremity supported;Feet supported Sitting balance-Leahy Scale: Good     Standing balance support: Bilateral upper extremity supported Standing balance-Leahy Scale: Poor         ADL either performed or assessed with clinical judgement   ADL Overall ADL's : Needs assistance/impaired Eating/Feeding: Independent;Sitting   Grooming: Wash/dry hands;Wash/dry face;Oral care;Applying deodorant;Set up;Sitting   Upper Body Bathing: Set up;Sitting   Lower Body Bathing: Moderate assistance;Sit to/from stand   Upper Body Dressing : Set up;Sitting   Lower Body Dressing: Moderate assistance;Sit to/from stand Lower Body Dressing Details (indicate cue type and reason): pt able to reach bilat socks Toilet Transfer: Minimal assistance;RW;Comfort height toilet;Grab bars Toilet Transfer Details (indicate cue type and reason): simulated to chair Toileting- Clothing Manipulation and Hygiene: Supervision/safety;Sitting/lateral lean       Functional mobility during ADLs: Minimal assistance;Rolling walker;Cueing for safety General ADL Comments: pt requires incrased assist in standing due to necessary BUE on RW for balance and pain management     Vision Patient Visual Report: No change from baseline Vision Assessment?: No apparent visual deficits            Pertinent Vitals/Pain Pain Assessment: Faces Faces Pain Scale: Hurts little more Pain Location: R hip Pain Descriptors / Indicators: Operative site guarding;Sore Pain Intervention(s): Monitored during session;Limited activity within patient's tolerance     Hand Dominance Right   Extremity/Trunk Assessment Upper Extremity Assessment Upper Extremity Assessment: Overall WFL for tasks assessed   Lower Extremity Assessment Lower  Extremity Assessment: Defer to PT evaluation   Cervical / Trunk Assessment Cervical / Trunk Assessment: Kyphotic (mild)   Communication Communication Communication: No difficulties   Cognition  Arousal/Alertness: Awake/alert Behavior During Therapy: WFL for tasks assessed/performed Overall Cognitive Status: Within Functional Limits for tasks assessed               General Comments  VSS on RA     Home Living Family/patient expects to be discharged to:: Private residence Living Arrangements: Spouse/significant other;Children Available Help at Discharge: Family;Available 24 hours/day Type of Home: House Home Access: Ramped entrance     Home Layout: Two level;Able to live on main level with bedroom/bathroom     Bathroom Shower/Tub: Teacher, early years/pre: Handicapped height Bathroom Accessibility: Yes How Accessible: Accessible via walker Home Equipment: [place] - single point;Wheelchair - manual   Additional Comments: has not walked in six months  Lives With: Spouse;Son    Prior Functioning/Environment Level of Independence: Needs assistance  Gait / Transfers Assistance Needed: wheelchair to bed transfers ADL's / Homemaking Assistance Needed: has been getting assisted by wife Communication / Swallowing Assistance Needed: WFL Comments: Assist levels reflect post-fall; he was indep prior        OT Problem List: Decreased strength;Decreased range of motion;Impaired balance (sitting and/or standing);Decreased activity tolerance;Decreased safety awareness;Decreased knowledge of precautions;Decreased knowledge of use of DME or AE;Pain      OT Treatment/Interventions: Self-care/ADL training;Therapeutic exercise;Neuromuscular education;DME and/or AE instruction;Therapeutic activities;Balance training;Patient/family education    OT Goals(Current goals can be found in the care plan section) Acute Rehab OT Goals Patient Stated Goal: None stated OT Goal Formulation: With patient Time For Goal Achievement: 10/29/20 Potential to Achieve Goals: Good ADL Goals Pt Will Perform Grooming: standing;with supervision Pt Will Perform Lower Body Bathing: with  supervision;sit to/from stand Pt Will Perform Lower Body Dressing: with supervision;sit to/from stand Pt Will Transfer to Toilet: with supervision;ambulating Pt Will Perform Toileting - Clothing Manipulation and hygiene: Independently  OT Frequency: Min 2X/week    AM-PAC OT "6 Clicks" Daily Activity     Outcome Measure Help from another person eating meals?: None Help from another person taking care of personal grooming?: A Little Help from another person toileting, which includes using toliet, bedpan, or urinal?: A Little Help from another person bathing (including washing, rinsing, drying)?: A Lot Help from another person to put on and taking off regular upper body clothing?: A Little Help from another person to put on and taking off regular lower body clothing?: A Lot 6 Click Score: 17   End of Session Equipment Utilized During Treatment: Gait belt;Rolling walker Nurse Communication: Mobility status  Activity Tolerance: Patient tolerated treatment well Patient left: in chair;with call bell/phone within reach  OT Visit Diagnosis: Unsteadiness on feet (R26.81);Other abnormalities of gait and mobility (R26.89);History of falling (Z91.81);Muscle weakness (generalized) (M62.81);Pain                Time: 3710-6269 OT Time Calculation (min): 14 min Charges:  OT General Charges $OT Visit: 1 Visit OT Evaluation $OT Eval Moderate Complexity: 1 Mod    Arrick Dutton A Talal Fritchman 10/15/2020, 9:42 AM

## 2020-10-15 NOTE — Progress Notes (Signed)
[facility] NEPHROLOGY PROGRESS NOTE  Assessment/ Plan: Pt is a 80 y.o. yo male  with history of hypertension, DM, HLD, nephrolithiasis, ESRD on HD MWF at [facility] followed by Dr. Theador Hawthorne, transferred from [facility] for displaced femoral neck fracture, seen as a consultation for the management of ESRD.  #Right femoral neck fracture: Seen by orthopedics and underwent right total hip arthroplasty on 6/29.  PT OT and rehab per primary and orthopedics team.    # ESRD: MWF at [facility].  Plan for next dialysis today on 7/6.  Right IJ TDC for access.   # Hypertension: On antihypertensive.  Volume management by ultrafiltration.   # Anemia of ESRD: Hemoglobin stable.  Need outpatient record about ESA treatment.  Iron saturation 10%--iv iron and esa ordered. Monitor hemoglobin, transfused for hgb <7   # Metabolic Bone Disease: Calcium and phosphorus level at goal.  #Altered mental status: Seen by neurology.  MRI brain with chronic microvascular ischemic changes and chronic pontine infarct without evidence of acute finding.   He looks alert awake and around his baseline.  #Hyponatremia/hypokalemia: Managed by dialysis.   Subjective: Seen and examined at bedside.  No acute events/complaints. Tolerated hd yesterday well, net uf 2L. Objective Vital signs in last 24 hours: Vitals:   10/14/20 1731 10/14/20 2136 10/15/20 0543 10/15/20 0700  BP: (!) 183/78 139/65 138/68 (!) 161/75  Pulse: 100 92 63 67  Resp: 17 18 18 18   Temp: 98.8 F (37.1 C) 99.7 F (37.6 C) 98.3 F (36.8 C) 98.6 F (37 C)  TempSrc: Oral Oral Oral Oral  SpO2: 99% 99% 98% 100%  Weight:      Height:       Weight change:   Intake/Output Summary (Last 24 hours) at 10/15/2020 0936 Last data filed at 10/15/2020 0700 Gross per 24 hour  Intake 720 ml  Output 2500 ml  Net -1780 ml       Labs: Basic Metabolic Panel: Recent Labs  Lab 10/14/20 0133 10/14/20 1753 10/15/20 0321  NA  129* 134* 131*  K 4.4 3.6 4.1  CL 97* 98 95*  CO2 25 27 26   GLUCOSE 232* 159* 236*  BUN 63* 29* 40*  CREATININE 6.20* 3.40* 4.48*  CALCIUM 8.1* 8.2* 8.1*  PHOS 5.2* 2.9 4.1   Liver Function Tests: Recent Labs  Lab 10/14/20 0133 10/14/20 1753 10/15/20 0321  AST 12* 17 15  ALT 7 9 8   ALKPHOS 31* 37* 29*  BILITOT 0.5 0.9 0.4  PROT 5.6* 6.6 5.5*  ALBUMIN 2.3* 2.6* 2.3*   No results for input(s): LIPASE, AMYLASE in the last 168 hours. Recent Labs  Lab 10/10/20 1236  AMMONIA 21   CBC: Recent Labs  Lab 10/12/20 0200 10/13/20 0215 10/14/20 0133 10/14/20 1301 10/15/20 0321  WBC 10.4 9.9 9.5 10.6* 7.4  NEUTROABS 8.1* 7.7 7.4  --  5.6  HGB 8.4* 8.4* 8.3* 8.6* 7.8*  HCT 26.0* 25.7* 24.8* 26.6* 24.4*  MCV 94.9 94.1 93.2 92.4 93.8  PLT 201 207 275 299 254   Cardiac Enzymes: No results for input(s): CKTOTAL, CKMB, CKMBINDEX, TROPONINI in the last 168 hours. CBG: Recent Labs  Lab 10/14/20 0629 10/14/20 1118 10/14/20 1747 10/14/20 2144 10/15/20 0656  GLUCAP 219* 169* 166* 181* 160*    Iron Studies:  Recent Labs    10/13/20 0215  IRON 14*  TIBC 147*  FERRITIN 327   Studies/Results: No results found.  Medications: Infusions:  ferric gluconate (FERRLECIT) IVPB 125 mg (  10/14/20 1645)    Scheduled Medications:  (feeding supplement) PROSource Plus  30 mL Oral TID BM   aspirin EC  325 mg Oral Q breakfast   carvedilol  6.25 mg Oral BID WC   chlorhexidine  60 mL Topical Once   Chlorhexidine Gluconate Cloth  6 each Topical Q0600   Chlorhexidine Gluconate Cloth  6 each Topical Q0600   cholecalciferol  1,000 Units Oral Daily   cloNIDine  0.2 mg Oral TID   darbepoetin (ARANESP) injection - DIALYSIS  60 mcg Intravenous Q Mon-HD   heparin injection (subcutaneous)  5,000 Units Subcutaneous Q8H   hydrALAZINE  50 mg Oral BID   insulin aspart  0-6 Units Subcutaneous TID WC   insulin detemir  8 Units Subcutaneous QHS   isoniazid  300 mg Oral Daily   multivitamin  1  tablet Oral QHS   polyethylene glycol  17 g Oral BID   povidone-iodine  2 application Topical Once   povidone-iodine  2 application Topical Once   pyridOXINE  50 mg Oral Daily   rosuvastatin  10 mg Oral Daily   senna-docusate  1 tablet Oral BID    have reviewed scheduled and prn medications.  Physical Exam: General: Looks comfortable, not in distress Heart:RRR, s1s2 nl Lungs: Clear b/l, no crackle Abdomen:soft, Non-tender, non-distended Extremities:No LE edema Dialysis Access: Right IJ TDC.  Khloi Rawl 10/15/2020,9:36 AM  LOS: 7 days

## 2020-10-15 NOTE — Progress Notes (Signed)
PROGRESS NOTE    Trevor [name]  FWY:[id number] DOB: 12-28-1940 DOA: 10/08/2020 PCP: Celene Squibb, MD   Brief Narrative:  The patient is a 80 year old obese African-American male with a past medical history significant for but not limited to end-stage renal disease on hemodialysis Monday Wednesday Friday, controlled diabetes mellitus type 2 as well as other comorbidities who presented to ED with hip pain and inability ambulate for about a month.  He went to the [facility] to get hemodialysis port replacement for 12 dizzy and fell.  At the [facility] ED he received a knee x-ray which revealed no injury.  After port replacement discharge patient to experience severe right hip pain rendering him unable to walk for about a month.  He saw Dr. Patrick Jupiter Of orthopedic surgery who ordered a hip x-ray which revealed a displaced femoral neck fracture.  Patient was needing surgery and transferred to [facility] for complex anesthesia with dialysis and orthopedic surgery and nephrology was consulted.  Patient is currently going to go for a right hip total arthroplasty from anterior approach and this was done 10/09/20.  Today he went for dialysis and tolerated it well but when he came back he was significantly confused. He was given Narcotics in dialysis. Narcan was given and did not change the patient's Mental Status. Neurology consulted and recommending MRI given his asterixis and poly myoclonus. Ammonia and ABG were also ordered. BP was elevated so was given Hydralazine   MRI Done and showed no acute CVA but did show chronic Pontine Infarct and Chronic White Matter Disease.  Neurology recommending checking a B12 and a TSH and treat if abnormal but both were normal. Social Work assisting with disposition and placement and now PT and OT Recommending CIR so will place Rehab consult for assessment and evaluation.   Assessment & Plan:   Principal Problem:   Hip fracture ([place]) Active Problems:   Diabetes mellitus with stage  5 chronic kidney disease (HCC)   Anemia   Gastroesophageal reflux disease   Hyperlipidemia   Neuropathy   End stage renal disease (HCC)   Hyponatremia   Hyperglycemia   Hip pain  Right Closed Displaced Femoral Neck Fracture Acute Hip Pain -Confirmed on imaging on 10/08/20 as an outpatient  -Orthopedic Surgery was consulted and underwent Right Total Hip Arthroplasty Anterior Approch on 10/09/20 -VTE Prophylaxis per Orthopedic Surgery and they are recommending aspirin, SCDs and teds -WB Status per Ortho and they are recommending weightbearing as tolerated with walker -PT/OT to evaluate and Treat and now recommending CIR and pending; Initially recommended SNF -Pain Control with Hydrocodone-Acetaminophen 1-2 tab\ q6hprn Severe Pain, and IV Hydromorphone 0.5 mg q3hprn Severe Pain but may need to be more Judicious given Confusion and Reduced Concentration and Twitching  -Bowel Regimen Post-Op but has Miralax 17 g po Daily PRN and Docusate 100 mg po BID but will change given Constipation -Ortho Recommending Advancing Diet and Up with Therapy    Constipation -Bowel Regimen Post-Op but has Miralax 17 g po Daily PRN and Docusate 100 mg po BID but will change given Constipation -Will change Miralax to 17 grams po BID and Change Docusate to Senna-Docusate 1 tab po BID -Also will add Bisacodyl Suppository 10 mg RC -Has a lot of Gas but no bowel movement yet -IF necessary will give Enema    DM  in setting of ESRD  -stable, last A1c was 6.2 -POC glucose was in 300s initially  -SSI Novolog 0-6 units TID with meals -Continue  insulin detemir 8 units qhs but may adjust further -Continue to Monitor CBG's per Protocol; CBGs now ranging from 160-181 -Consult diabetes education coordinator for further assistance with blood sugar management -Blood sugar on this morning's CMP was 236  ESRD on HD MWF Metabolic Acidosis Hyperphosphatemia -Creatinine 6-7 at baseline.  -Patient's BUN/Cr went from 44/4.44  -> 51/5.05 -> 54/5.53 -> 31/4.45 -> 20/3.24 -> 45/5.10 -> 63/6.20 -> 29/3.40 -> 40/4.48   -Patient's Phos Level went from 4.7 -> 3.5 -> 5.1 -> 5.2 -> 2.9 -> 4.1 -Patient had a Metabolic Acidosis this this is improved as CO2 is 24, AG is 9, and Chloride Level is 96 -Continiue HDS as scheduled (M, W, F) -Vit D3 1000 units -Nephrology evaluated and he underwent twice (Last session yesterdy with 1 Liter Ultrafiltration) with next session scheduled for today 10/16/20  Hypokalemia -K+ is now 4.1 -Further management per Dialysis session -Continue to Monitor and Replete as Necessary -Repeat CMP in the AM    Hypertension  -Patient took home meds this AM (coreg, clonidine, statin). BP systolic 277O in ED in setting of severe pain. Improved with hydralazine 10mg  IV -C/w Carvedilol 6.25 mg bid PO -C/w Hydralazine 50 mg bid PO -C/w Clonidine 0.2 mg TID -C/w PRN Hydralazine  -Continue to Monitor BP per Protocols; Last BP was 161/75  Confusion/Encephalopathy and Polymyoclonus, improved and back to baseline -Likely Medication induced -Patient could not follow commands and would not communicate with the Nurse after Dialysis -Spoke with Neurology who recommended calling a CODE Stroke initially but then cancelled -Given Narcan and obtained ABG -ABG    Component Value Date/Time   PHART 7.428 10/10/2020 1250   PCO2ART 36.6 10/10/2020 1250   PO2ART 107 10/10/2020 1250   HCO3 23.0 10/10/2020 1250   TCO2 23 11/30/2019 0619   ACIDBASEDEF 0.3 10/10/2020 1250   O2SAT 98.0 10/10/2020 1250  -Was going to get a Stat Head CT but Neuro recommended changing to MRI of Brain; MRI Brain showed "No evidence of recent infarction, hemorrhage, or mass. Chronic microvascular ischemic changes.  Chronic pontine infarct." -Ammonia Level was 21 -Neurology formally consulted and now recommending checking B12 and TSH both of which were normal. -Patient's Mental Status has improved to Baseline   HLD -Rosuvastatin 10 mg    Hyponatremia  -In setting of ESRD and volume overload -Na+ 132 on Admission and improved to 134 yesterday and today is 131 -Continue hemodialysis as scheduled and likley to be correct in Dialysis -Repeat CMP in the AM   HLD -C/w Rosuvastatin 10 mg po Daily   Anemia of chronic disease  -Baseline Hgb ~8.5-9. Admission was 9.9 -Hgb/Hct relatively stable at 7.8/24.4 -As Expected had a Drop Post Operatively -Nephrology planning on ordering IV Iron with Dialysis  -Checked Anemia Panel and showed iron level of 13, U IBC of 133, TIBC 147, saturation ratios of 10%, ferritin of 327, folate level 12.3, vitamin B12 256 -Continue to Monitor For S/Sx of Bleeding; No overt Bleeding noted -Repeat CBC in the AM   Latent TB -C/w Isoniazide 300 mg po Daily and Pyridoxine 50 mg   Obesity -Complicates overall Prognosis and Care -Estimated body mass index is 30.38 kg/m as calculated from the following:   Height as of this encounter: 5\' 11"  (1.803 m).   Weight as of this encounter: 98.8 kg. -Weight Loss and Dietary Counseling given -Dietary evaluated and recommending 30 mL of Prosource plus 3 times daily as well as renal multivitamin daily  Leukocytosis -Likely in the setting  of Surgical Intervention -Patient's WBC went from 7.9 -> 11.7 -> 11.4 -> 10.4 -> 9.9 -> 9.5 -> 10.6 -> 11.8 -> 7.4 -Continue to Monitor and Trend  -Repeat CBC in the AM   DVT prophylaxis: Heparin 5,000 units sq q8h Code Status: FULL CODE  Family Communication: No Family present at bedside Disposition Plan: Pending further clearance by Ortho and Nephrology and Evaluation by PT/OT; Now PT/OT Recommending CIR and will D/C pending Insurance Authorization and bed availability   Status is: Inpatient  Remains inpatient appropriate because:Unsafe d/c plan, IV treatments appropriate due to intensity of illness or inability to take PO, and Inpatient level of care appropriate due to severity of illness  Dispo: The patient is from:  Home              Anticipated d/c is to: SNF              Patient currently is not medically stable to d/c.   Difficult to place patient No  Consultants:  Orthopedic Surgery Nephrology  Neurology   Procedures: Right Total Hip Arthroplasty Anterior Approach on 10/09/20 by Dr. Lyla Glassing   Antimicrobials:  Anti-infectives (From admission, onward)    Start     Dose/Rate Route Frequency Ordered Stop   10/10/20 0600  ceFAZolin (ANCEF) IVPB 2g/100 mL premix  Status:  Discontinued        2 g 200 mL/hr over 30 Minutes Intravenous On call to O.R. 10/09/20 2106 10/09/20 2130   10/09/20 2200  ceFAZolin (ANCEF) IVPB 2g/100 mL premix  Status:  Discontinued        2 g 200 mL/hr over 30 Minutes Intravenous Every 6 hours 10/09/20 2109 10/10/20 0036   10/09/20 1000  isoniazid (NYDRAZID) tablet 300 mg        300 mg Oral Daily 10/08/20 1629     10/09/20 0830  ceFAZolin (ANCEF) IVPB 2g/100 mL premix        2 g 200 mL/hr over 30 Minutes Intravenous On call to O.R. 10/09/20 0743 10/09/20 1502        Subjective: Seen and examined at bedside and sitting in the chair at bedside and still has not had a bowel movement yet but having a lot of gas. Did not sleep very well last night. Tolerated dialysis but states his BP went up a bit in it yesterday. No CP or SOB. No Nausea or vomiting. No other concerns or complaints at this time.   Objective: Vitals:   10/14/20 1731 10/14/20 2136 10/15/20 0543 10/15/20 0700  BP: (!) 183/78 139/65 138/68 (!) 161/75  Pulse: 100 92 63 67  Resp: 17 18 18 18   Temp: 98.8 F (37.1 C) 99.7 F (37.6 C) 98.3 F (36.8 C) 98.6 F (37 C)  TempSrc: Oral Oral Oral Oral  SpO2: 99% 99% 98% 100%  Weight:      Height:        Intake/Output Summary (Last 24 hours) at 10/15/2020 0272 Last data filed at 10/15/2020 0700 Gross per 24 hour  Intake 480 ml  Output 2500 ml  Net -2020 ml    Filed Weights   10/10/20 1123 10/14/20 1330 10/14/20 1713  Weight: 97 kg 100.8 kg 98.8 kg    Examination: Physical Exam:  Constitutional: WN/WD overweight AAM in NAD and appears calm and comfortable Eyes: Lids and conjunctivae normal, sclerae anicteric  ENMT: External Ears, Nose appear normal. Grossly normal hearing.  Neck: Appears normal, supple, no cervical masses, normal ROM, no appreciable thyromegaly; no appreciable  JVD Respiratory: Mildly diminished to auscultation bilaterally, no wheezing, rales, rhonchi or crackles. Normal respiratory effort and patient is not tachypenic. No accessory muscle use. Unlabored breathing  Cardiovascular: RRR, no murmurs / rubs / gallops. S1 and S2 auscultated. No appreciable LE Edema Abdomen: Soft, non-tender, Distended due to body habitus. Bowel sounds positive.  GU: Deferred. Musculoskeletal: No clubbing / cyanosis of digits/nails. No joint deformity upper and lower extremities. Has a right Chest wall TDC Skin: No rashes, lesions, ulcers on a limited skin evaluation. No induration; Warm and dry.  Neurologic: CN 2-12 grossly intact with no focal deficits. Romberg sign and cerebellar reflexes not assessed.  Psychiatric: Normal judgment and insight. Alert and oriented x 3. Normal and pleasant mood and appropriate affect.   Data Reviewed: I have personally reviewed following labs and imaging studies  CBC: Recent Labs  Lab 10/11/20 0442 10/12/20 0200 10/13/20 0215 10/14/20 0133 10/14/20 1301 10/15/20 0321  WBC 11.4* 10.4 9.9 9.5 10.6* 7.4  NEUTROABS 8.9* 8.1* 7.7 7.4  --  5.6  HGB 9.1* 8.4* 8.4* 8.3* 8.6* 7.8*  HCT 27.9* 26.0* 25.7* 24.8* 26.6* 24.4*  MCV 94.6 94.9 94.1 93.2 92.4 93.8  PLT 211 201 207 275 299 026    Basic Metabolic Panel: Recent Labs  Lab 10/12/20 0200 10/13/20 0215 10/14/20 0133 10/14/20 1753 10/15/20 0321  NA 131* 129* 129* 134* 131*  K 3.3* 3.9 4.4 3.6 4.1  CL 96* 96* 97* 98 95*  CO2 27 24 25 27 26   GLUCOSE 202* 244* 232* 159* 236*  BUN 20 45* 63* 29* 40*  CREATININE 3.24* 5.10* 6.20* 3.40* 4.48*   CALCIUM 8.0* 8.0* 8.1* 8.2* 8.1*  MG 1.7 1.8 1.7 1.8 1.9  PHOS 3.5 5.1* 5.2* 2.9 4.1    GFR: Estimated Creatinine Clearance: 16 mL/min (A) (by C-G formula based on SCr of 4.48 mg/dL (H)). Liver Function Tests: Recent Labs  Lab 10/12/20 0200 10/13/20 0215 10/14/20 0133 10/14/20 1753 10/15/20 0321  AST 18 16 12* 17 15  ALT 9 9 7 9 8   ALKPHOS 32* 32* 31* 37* 29*  BILITOT 0.7 0.7 0.5 0.9 0.4  PROT 5.4* 5.6* 5.6* 6.6 5.5*  ALBUMIN 2.3* 2.3* 2.3* 2.6* 2.3*    No results for input(s): LIPASE, AMYLASE in the last 168 hours. Recent Labs  Lab 10/10/20 1236  AMMONIA 21    Coagulation Profile: Recent Labs  Lab 10/08/20 1224  INR 1.1    Cardiac Enzymes: No results for input(s): CKTOTAL, CKMB, CKMBINDEX, TROPONINI in the last 168 hours. BNP (last 3 results) No results for input(s): PROBNP in the last 8760 hours. HbA1C: No results for input(s): HGBA1C in the last 72 hours.  CBG: Recent Labs  Lab 10/14/20 0629 10/14/20 1118 10/14/20 1747 10/14/20 2144 10/15/20 0656  GLUCAP 219* 169* 166* 181* 160*    Lipid Profile: No results for input(s): CHOL, HDL, LDLCALC, TRIG, CHOLHDL, LDLDIRECT in the last 72 hours. Thyroid Function Tests: No results for input(s): TSH, T4TOTAL, FREET4, T3FREE, THYROIDAB in the last 72 hours.  Anemia Panel: Recent Labs    10/13/20 0215  VITAMINB12 256  FOLATE 12.3  FERRITIN 327  TIBC 147*  IRON 14*  RETICCTPCT 2.2    Sepsis Labs: No results for input(s): PROCALCITON, LATICACIDVEN in the last 168 hours.  Recent Results (from the past 240 hour(s))  Resp Panel by RT-PCR (Flu A&B, Covid) Nasopharyngeal Swab     Status: None   Collection Time: 10/08/20  1:44 PM   Specimen: Nasopharyngeal Swab;  Nasopharyngeal(NP) swabs in vial transport medium  Result Value Ref Range Status   SARS Coronavirus 2 by RT PCR NEGATIVE NEGATIVE Final    Comment: (NOTE) SARS-CoV-2 target nucleic acids are NOT DETECTED.  The SARS-CoV-2 RNA is generally  detectable in upper respiratory specimens during the acute phase of infection. The lowest concentration of SARS-CoV-2 viral copies this assay can detect is 138 copies/mL. A negative result does not preclude SARS-Cov-2 infection and should not be used as the sole basis for treatment or other patient management decisions. A negative result may occur with  improper specimen collection/handling, submission of specimen other than nasopharyngeal swab, presence of viral mutation(s) within the areas targeted by this assay, and inadequate number of viral copies(<138 copies/mL). A negative result must be combined with clinical observations, patient history, and epidemiological information. The expected result is Negative.  Fact Sheet for Patients:  EntrepreneurPulse.com.au  Fact Sheet for Healthcare Providers:  IncredibleEmployment.be  This test is no t yet approved or cleared by the [place] FDA and  has been authorized for detection and/or diagnosis of SARS-CoV-2 by FDA under an Emergency Use Authorization (EUA). This EUA will remain  in effect (meaning this test can be used) for the duration of the COVID-19 declaration under Section 564(b)(1) of the Act, 21 U.S.C.section 360bbb-3(b)(1), unless the authorization is terminated  or revoked sooner.       Influenza A by PCR NEGATIVE NEGATIVE Final   Influenza B by PCR NEGATIVE NEGATIVE Final    Comment: (NOTE) The Xpert Xpress SARS-CoV-2/FLU/RSV plus assay is intended as an aid in the diagnosis of influenza from Nasopharyngeal swab specimens and should not be used as a sole basis for treatment. Nasal washings and aspirates are unacceptable for Xpert Xpress SARS-CoV-2/FLU/RSV testing.  Fact Sheet for Patients: EntrepreneurPulse.com.au  Fact Sheet for Healthcare Providers: IncredibleEmployment.be  This test is not yet approved or cleared by the [place] FDA  and has been authorized for detection and/or diagnosis of SARS-CoV-2 by FDA under an Emergency Use Authorization (EUA). This EUA will remain in effect (meaning this test can be used) for the duration of the COVID-19 declaration under Section 564(b)(1) of the Act, 21 U.S.C. section 360bbb-3(b)(1), unless the authorization is terminated or revoked.  Performed at [facility], [address] [place], [place] [address]   Surgical pcr screen     Status: None   Collection Time: 10/09/20  3:58 AM   Specimen: Nasal Mucosa; Nasal Swab  Result Value Ref Range Status   MRSA, PCR NEGATIVE NEGATIVE Final   Staphylococcus aureus NEGATIVE NEGATIVE Final    Comment: (NOTE) The Xpert SA Assay (FDA approved for NASAL specimens in patients 70 years of age and older), is one component of a comprehensive surveillance program. It is not intended to diagnose infection nor to guide or monitor treatment. Performed at [facility], [place] [address] [place], [place] [address]       RN Pressure Injury Documentation:     Estimated body mass index is 30.38 kg/m as calculated from the following:   Height as of this encounter: 5\' 11"  (1.803 m).   Weight as of this encounter: 98.8 kg.  Malnutrition Type:  Malnutrition Characteristics: Signs/Symptoms: estimated needs Nutrition Interventions: Interventions: MVI, Prostat   Radiology Studies: No results found.  Scheduled Meds:  (feeding supplement) PROSource Plus  30 mL Oral TID BM   aspirin EC  325 mg Oral Q breakfast   carvedilol  6.25 mg Oral BID WC   chlorhexidine  60 mL Topical Once   Chlorhexidine Gluconate Cloth  6 each Topical Q0600   Chlorhexidine Gluconate Cloth  6 each Topical Q0600   cholecalciferol  1,000 Units Oral Daily   cloNIDine  0.2 mg Oral TID   darbepoetin (ARANESP) injection - DIALYSIS  60 mcg Intravenous Q Mon-HD   heparin injection (subcutaneous)  5,000 Units Subcutaneous Q8H   hydrALAZINE  50 mg Oral BID   insulin  aspart  0-6 Units Subcutaneous TID WC   insulin detemir  8 Units Subcutaneous QHS   isoniazid  300 mg Oral Daily   multivitamin  1 tablet Oral QHS   polyethylene glycol  17 g Oral BID   povidone-iodine  2 application Topical Once   povidone-iodine  2 application Topical Once   pyridOXINE  50 mg Oral Daily   rosuvastatin  10 mg Oral Daily   senna-docusate  1 tablet Oral BID   Continuous Infusions:  ferric gluconate (FERRLECIT) IVPB 125 mg (10/14/20 1645)    LOS: 7 days   Kerney Elbe, DO [facility] PAGER is on AMION  If 7PM-7AM, please contact night-coverage [email or web address]

## 2020-10-15 NOTE — Care Management Important Message (Signed)
Important Message  Patient Details  Name: Trevor [name] MRN: [id number] Date of Birth: [birth year]   Medicare Important Message Given:  Yes     Venisa Frampton P Xoe Hoe 10/15/2020, 3:34 PM

## 2020-10-16 DIAGNOSIS — M79605 Pain in left leg: Secondary | ICD-10-CM

## 2020-10-16 DIAGNOSIS — M79604 Pain in right leg: Secondary | ICD-10-CM

## 2020-10-16 LAB — COMPREHENSIVE METABOLIC PANEL
ALT: 8 U/L (ref 0–44)
AST: 13 U/L — ABNORMAL LOW (ref 15–41)
Albumin: 2.4 g/dL — ABNORMAL LOW (ref 3.5–5.0)
Alkaline Phosphatase: 31 U/L — ABNORMAL LOW (ref 38–126)
Anion gap: 7 (ref 5–15)
BUN: 50 mg/dL — ABNORMAL HIGH (ref 8–23)
CO2: 28 mmol/L (ref 22–32)
Calcium: 8.2 mg/dL — ABNORMAL LOW (ref 8.9–10.3)
Chloride: 98 mmol/L (ref 98–111)
Creatinine, Ser: 5.87 mg/dL — ABNORMAL HIGH (ref 0.61–1.24)
GFR, Estimated: 9 mL/min — ABNORMAL LOW (ref >60)
Glucose, Bld: 182 mg/dL — ABNORMAL HIGH (ref 70–99)
Potassium: 4.3 mmol/L (ref 3.5–5.1)
Sodium: 133 mmol/L — ABNORMAL LOW (ref 135–145)
Total Bilirubin: 0.6 mg/dL (ref 0.3–1.2)
Total Protein: 5.9 g/dL — ABNORMAL LOW (ref 6.5–8.1)

## 2020-10-16 LAB — CBC WITH DIFFERENTIAL/PLATELET
Abs Immature Granulocytes: 0.03 10*3/uL (ref 0.00–0.07)
Basophils Absolute: 0.1 10*3/uL (ref 0.0–0.1)
Basophils Relative: 1 %
Eosinophils Absolute: 0.2 10*3/uL (ref 0.0–0.5)
Eosinophils Relative: 3 %
HCT: 24.5 % — ABNORMAL LOW (ref 39.0–52.0)
Hemoglobin: 7.8 g/dL — ABNORMAL LOW (ref 13.0–17.0)
Immature Granulocytes: 0 %
Lymphocytes Relative: 13 %
Lymphs Abs: 1.1 10*3/uL (ref 0.7–4.0)
MCH: 30.1 pg (ref 26.0–34.0)
MCHC: 31.8 g/dL (ref 30.0–36.0)
MCV: 94.6 fL (ref 80.0–100.0)
Monocytes Absolute: 0.8 10*3/uL (ref 0.1–1.0)
Monocytes Relative: 10 %
Neutro Abs: 6.2 10*3/uL (ref 1.7–7.7)
Neutrophils Relative %: 73 %
Platelets: 270 10*3/uL (ref 150–400)
RBC: 2.59 MIL/uL — ABNORMAL LOW (ref 4.22–5.81)
RDW: 14.7 % (ref 11.5–15.5)
WBC: 8.4 10*3/uL (ref 4.0–10.5)
nRBC: 0 % (ref 0.0–0.2)

## 2020-10-16 LAB — GLUCOSE, CAPILLARY
Glucose-Capillary: 156 mg/dL — ABNORMAL HIGH (ref 70–99)
Glucose-Capillary: 171 mg/dL — ABNORMAL HIGH (ref 70–99)
Glucose-Capillary: 167 mg/dL — ABNORMAL HIGH (ref 70–99)
Glucose-Capillary: 219 mg/dL — ABNORMAL HIGH (ref 70–99)

## 2020-10-16 LAB — MAGNESIUM: Magnesium: 2 mg/dL (ref 1.7–2.4)

## 2020-10-16 LAB — PHOSPHORUS: Phosphorus: 5.1 mg/dL — ABNORMAL HIGH (ref 2.5–4.6)

## 2020-10-16 MED ORDER — HEPARIN SODIUM (PORCINE) 1000 UNIT/ML IJ SOLN
INTRAMUSCULAR | Status: AC
  Filled 2020-10-16: qty 4

## 2020-10-16 MED ORDER — SODIUM CHLORIDE 0.9 % IV SOLN: 100.0000 mL | INTRAVENOUS | Status: DC | PRN

## 2020-10-16 MED ORDER — SENNOSIDES-DOCUSATE SODIUM 8.6-50 MG PO TABS
2.0000 | ORAL_TABLET | Freq: Two times a day (BID) | ORAL | Status: DC
  Filled 2020-10-16 (×2): qty 2

## 2020-10-16 MED ORDER — ALTEPLASE 2 MG IJ SOLR: 2.0000 mg | Freq: Once | INTRAMUSCULAR | Status: DC | PRN

## 2020-10-16 MED ORDER — HEPARIN SODIUM (PORCINE) 1000 UNIT/ML DIALYSIS: 20.0000 [IU]/kg | INTRAMUSCULAR | Status: DC | PRN

## 2020-10-16 MED ORDER — PENTAFLUOROPROP-TETRAFLUOROETH EX AERO: 1.0000 "application " | INHALATION_SPRAY | CUTANEOUS | Status: DC | PRN

## 2020-10-16 MED ORDER — LIDOCAINE HCL (PF) 1 % IJ SOLN: 5.0000 mL | INTRAMUSCULAR | Status: DC | PRN

## 2020-10-16 MED ORDER — LIDOCAINE-PRILOCAINE 2.5-2.5 % EX CREA: 1.0000 "application " | TOPICAL_CREAM | CUTANEOUS | Status: DC | PRN

## 2020-10-16 MED ORDER — HEPARIN SODIUM (PORCINE) 1000 UNIT/ML DIALYSIS: 1000.0000 [IU] | INTRAMUSCULAR | Status: DC | PRN

## 2020-10-16 NOTE — Progress Notes (Signed)
Hemodialysis- Tolerated well. UF goal met without issue. 3L removed. Patient currently is without complaints. Reported off to primary RN on 5N.

## 2020-10-16 NOTE — Progress Notes (Signed)
PROGRESS NOTE  Trevor [name] GMW:[id number] DOB: December 12, 1940 DOA: 10/08/2020 PCP: Celene Squibb, MD   LOS: 8 days   Brief Narrative / Interim history: 80 year old male with history of ESRD on HD, DM 2, comes into the hospital with hip pain and inability to ambulate.  Patient went to [facility] on 5/28 to get HD port replacement but felt dizzy and fell.  Initial imaging was negative for injury however he had persistent severe right hip pain, saw orthopedics as an outpatient and a hip x-ray showed displaced femoral neck fracture.  He was admitted to the hospital is now status post repair.  Subjective / 24h Interval events: He is doing well today, looking forward to rehab.  Assessment & Plan: Principal Problem Right closed displaced femoral neck fracture-orthopedic surgery consulted, underwent right total hip arthroplasty on 10/09/2020.  PT recommends CIR however insurance denied, will appeal  Active Problems Acute metabolic encephalopathy /polymyoclonus -patient had an event during this hospital stay when he returned from dialysis he was confused.  Neurology consulted, underwent an MRI of the brain which showed no acute findings.  He was believed to be narcotic induced as he responded to Narcan.  He is now back to baseline  ESRD on HD-nephrology following, continue HD as scheduled  Hypokalemia, hyponatremia-managed with dialysis  Essential hypertension-continue home regimen  Hyperlipidemia-continue statin  Anemia of chronic kidney disease-hemoglobin stable  DM2-A1c 6.2.  Continue sliding scale  CBG (last 3)  Recent Labs    10/15/20 2156 10/16/20 0624 10/16/20 1120  GLUCAP 178* 156* 171*   Latent TB -C/w Isoniazide 300 mg po Daily and Pyridoxine 50 mg  Obesity -he would benefit from weight loss    Scheduled Meds:  aspirin EC  325 mg Oral Q breakfast   carvedilol  6.25 mg Oral BID WC   chlorhexidine  60 mL Topical Once   Chlorhexidine Gluconate Cloth  6 each Topical Q0600    Chlorhexidine Gluconate Cloth  6 each Topical Q0600   cholecalciferol  1,000 Units Oral Daily   cloNIDine  0.2 mg Oral TID   darbepoetin (ARANESP) injection - DIALYSIS  60 mcg Intravenous Q Mon-HD   heparin injection (subcutaneous)  5,000 Units Subcutaneous Q8H   hydrALAZINE  50 mg Oral BID   insulin aspart  0-6 Units Subcutaneous TID WC   insulin detemir  8 Units Subcutaneous QHS   isoniazid  300 mg Oral Daily   multivitamin  1 tablet Oral QHS   polyethylene glycol  17 g Oral BID   povidone-iodine  2 application Topical Once   povidone-iodine  2 application Topical Once   pyridOXINE  50 mg Oral Daily   rosuvastatin  10 mg Oral Daily   senna-docusate  2 tablet Oral BID   Continuous Infusions:  sodium chloride     sodium chloride     ferric gluconate (FERRLECIT) IVPB 125 mg (10/14/20 1645)   PRN Meds:.sodium chloride, sodium chloride, alteplase, bisacodyl, heparin, heparin, hydrALAZINE, HYDROcodone-acetaminophen, HYDROmorphone (DILAUDID) injection, lidocaine (PF), lidocaine-prilocaine, menthol-cetylpyridinium **OR** phenol, metoCLOPramide **OR** metoCLOPramide (REGLAN) injection, naLOXone (NARCAN)  injection, ondansetron **OR** ondansetron (ZOFRAN) IV, pentafluoroprop-tetrafluoroeth  Diet Orders (From admission, onward)     Start     Ordered   10/09/20 2110  Diet Carb Modified Fluid consistency: Thin; Room service appropriate? Yes  Diet effective now       Question Answer Comment  Calorie Level Medium 1600-2000   Fluid consistency: Thin   Room service appropriate? Yes      10/09/20  2109            DVT prophylaxis: heparin injection 5,000 Units Start: 10/13/20 1415 SCDs Start: 10/09/20 2110     Code Status: Full Code  Family Communication: no family at bedside   Status is: Inpatient  Remains inpatient appropriate because:Unsafe d/c plan  Dispo: The patient is from: Home              Anticipated d/c is to: CIR              Patient currently is not medically  stable to d/c.   Difficult to place patient No  Level of care: Med-Surg  Consultants:  Orthopedic surgery  Procedures:  Hip surgery  Microbiology  None  Antimicrobials: None   Objective: Vitals:   10/15/20 0700 10/15/20 1700 10/15/20 2156 10/16/20 0700  BP: (!) 161/75 (!) 162/67 (!) 148/65 (!) 158/60  Pulse: 67 68 68 62  Resp: 18 17 16 18   Temp: 98.6 F (37 C) 99.7 F (37.6 C) 98.6 F (37 C) 99 F (37.2 C)  TempSrc: Oral Oral Oral Oral  SpO2: 100% 96% 100% 100%  Weight:      Height:        Intake/Output Summary (Last 24 hours) at 10/16/2020 1413 Last data filed at 10/16/2020 1404 Gross per 24 hour  Intake 680 ml  Output 780 ml  Net -100 ml   Filed Weights   10/10/20 1123 10/14/20 1330 10/14/20 1713  Weight: 97 kg 100.8 kg 98.8 kg    Examination:  Constitutional: NAD Eyes: no scleral icterus ENMT: mmm Neck: normal, supple Respiratory: clear to auscultation bilaterally, no wheezing, no crackles. Normal respiratory effort. No accessory muscle use.  Cardiovascular: Regular rate and rhythm, no murmurs / rubs / gallops. No LE edema.  Abdomen: non distended, no tenderness. Bowel sounds positive.  Musculoskeletal: no clubbing / cyanosis.  Skin: no rashes Neurologic: non focal   Data Reviewed: I have independently reviewed following labs and imaging studies   CBC: Recent Labs  Lab 10/12/20 0200 10/13/20 0215 10/14/20 0133 10/14/20 1301 10/14/20 1753 10/15/20 0321 10/16/20 0222  WBC 10.4 9.9 9.5 10.6* 11.8* 7.4 8.4  NEUTROABS 8.1* 7.7 7.4  --   --  5.6 6.2  HGB 8.4* 8.4* 8.3* 8.6* 9.1* 7.8* 7.8*  HCT 26.0* 25.7* 24.8* 26.6* 28.7* 24.4* 24.5*  MCV 94.9 94.1 93.2 92.4 92.3 93.8 94.6  PLT 201 207 275 299 312 254 413   Basic Metabolic Panel: Recent Labs  Lab 10/13/20 0215 10/14/20 0133 10/14/20 1753 10/15/20 0321 10/16/20 0222  NA 129* 129* 134* 131* 133*  K 3.9 4.4 3.6 4.1 4.3  CL 96* 97* 98 95* 98  CO2 24 25 27 26 28   GLUCOSE 244* 232* 159*  236* 182*  BUN 45* 63* 29* 40* 50*  CREATININE 5.10* 6.20* 3.40* 4.48* 5.87*  CALCIUM 8.0* 8.1* 8.2* 8.1* 8.2*  MG 1.8 1.7 1.8 1.9 2.0  PHOS 5.1* 5.2* 2.9 4.1 5.1*   Liver Function Tests: Recent Labs  Lab 10/13/20 0215 10/14/20 0133 10/14/20 1753 10/15/20 0321 10/16/20 0222  AST 16 12* 17 15 13*  ALT 9 7 9 8 8   ALKPHOS 32* 31* 37* 29* 31*  BILITOT 0.7 0.5 0.9 0.4 0.6  PROT 5.6* 5.6* 6.6 5.5* 5.9*  ALBUMIN 2.3* 2.3* 2.6* 2.3* 2.4*   Coagulation Profile: No results for input(s): INR, PROTIME in the last 168 hours. HbA1C: No results for input(s): HGBA1C in the last 72 hours. CBG: Recent Labs  Lab 10/15/20 1130 10/15/20 1706 10/15/20 2156 10/16/20 0624 10/16/20 1120  GLUCAP 186* 179* 178* 156* 171*    Recent Results (from the past 240 hour(s))  Resp Panel by RT-PCR (Flu A&B, Covid) Nasopharyngeal Swab     Status: None   Collection Time: 10/08/20  1:44 PM   Specimen: Nasopharyngeal Swab; Nasopharyngeal(NP) swabs in vial transport medium  Result Value Ref Range Status   SARS Coronavirus 2 by RT PCR NEGATIVE NEGATIVE Final    Comment: (NOTE) SARS-CoV-2 target nucleic acids are NOT DETECTED.  The SARS-CoV-2 RNA is generally detectable in upper respiratory specimens during the acute phase of infection. The lowest concentration of SARS-CoV-2 viral copies this assay can detect is 138 copies/mL. A negative result does not preclude SARS-Cov-2 infection and should not be used as the sole basis for treatment or other patient management decisions. A negative result may occur with  improper specimen collection/handling, submission of specimen other than nasopharyngeal swab, presence of viral mutation(s) within the areas targeted by this assay, and inadequate number of viral copies(<138 copies/mL). A negative result must be combined with clinical observations, patient history, and epidemiological information. The expected result is Negative.  Fact Sheet for Patients:   EntrepreneurPulse.com.au  Fact Sheet for Healthcare Providers:  IncredibleEmployment.be  This test is no t yet approved or cleared by the [place] FDA and  has been authorized for detection and/or diagnosis of SARS-CoV-2 by FDA under an Emergency Use Authorization (EUA). This EUA will remain  in effect (meaning this test can be used) for the duration of the COVID-19 declaration under Section 564(b)(1) of the Act, 21 U.S.C.section 360bbb-3(b)(1), unless the authorization is terminated  or revoked sooner.       Influenza A by PCR NEGATIVE NEGATIVE Final   Influenza B by PCR NEGATIVE NEGATIVE Final    Comment: (NOTE) The Xpert Xpress SARS-CoV-2/FLU/RSV plus assay is intended as an aid in the diagnosis of influenza from Nasopharyngeal swab specimens and should not be used as a sole basis for treatment. Nasal washings and aspirates are unacceptable for Xpert Xpress SARS-CoV-2/FLU/RSV testing.  Fact Sheet for Patients: EntrepreneurPulse.com.au  Fact Sheet for Healthcare Providers: IncredibleEmployment.be  This test is not yet approved or cleared by the [place] FDA and has been authorized for detection and/or diagnosis of SARS-CoV-2 by FDA under an Emergency Use Authorization (EUA). This EUA will remain in effect (meaning this test can be used) for the duration of the COVID-19 declaration under Section 564(b)(1) of the Act, 21 U.S.C. section 360bbb-3(b)(1), unless the authorization is terminated or revoked.  Performed at [facility], [address] [place], [place] [address]   Surgical pcr screen     Status: None   Collection Time: 10/09/20  3:58 AM   Specimen: Nasal Mucosa; Nasal Swab  Result Value Ref Range Status   MRSA, PCR NEGATIVE NEGATIVE Final   Staphylococcus aureus NEGATIVE NEGATIVE Final    Comment: (NOTE) The Xpert SA Assay (FDA approved for NASAL specimens in patients 23 years of  age and older), is one component of a comprehensive surveillance program. It is not intended to diagnose infection nor to guide or monitor treatment. Performed at [facility], [place] [address], [place], [place] [address]      Radiology Studies: No results found.  Marzetta Board, MD, PhD [facility]  Between 7 am - 7 pm I am available, please contact me via Amion (for emergencies) or Securechat (non urgent messages)  Between 7 pm - 7 am  I am not available, please contact night coverage MD/APP via Amion

## 2020-10-16 NOTE — Progress Notes (Signed)
Inpatient Rehab Admissions Coordinator:   Inpatient rehab denied CIR admit following peer to peer with MD. Pt. Currently in HD, but [facility] will notify pt. In the am.   Clemens Catholic, [place], [place] Admissions Coordinator  [phone] ([place]) [phone] (office)

## 2020-10-16 NOTE — Progress Notes (Signed)
PT Cancellation Note  Patient Details Name: Trevor [name] MRN: [id number] DOB: [birth year]   Cancelled Treatment:    Reason Eval/Treat Not Completed: (P) Patient at procedure or test/unavailable (Pt off unit for HD.  Will f/u per POC.)   Mckinsley Koelzer Eli Hose 10/16/2020, 2:38 PM  Erasmo Leventhal , PTA Acute Rehabilitation Services Pager [phone] Office [phone]

## 2020-10-16 NOTE — Progress Notes (Signed)
Inpatient Rehab Admissions Coordinator:   Insurance has requested a peer to peer related to authorization for CIR. [employer] to call Md after 1pm today or tomorrow. I will follow for potential CIR admit pending bed availability and insurance auth.   Clemens Catholic, [place], [place] Admissions Coordinator  [phone] ([place]) [phone] (office)

## 2020-10-16 NOTE — Plan of Care (Signed)
RN called HD and confirmed that Pt is on the list to have HD today around mid day. Pt updated.  Problem: Health Behavior/Discharge Planning: Goal: Ability to manage health-related needs will improve Outcome: Progressing   Problem: Clinical Measurements: Goal: Ability to maintain clinical measurements within normal limits will improve Outcome: Progressing   Problem: Activity: Goal: Risk for activity intolerance will decrease Outcome: Progressing   Problem: Nutrition: Goal: Adequate nutrition will be maintained Outcome: Progressing   Problem: Coping: Goal: Level of anxiety will decrease Outcome: Progressing   Problem: Elimination: Goal: Will not experience complications related to bowel motility Outcome: Progressing   Problem: Pain Managment: Goal: General experience of comfort will improve Outcome: Progressing   Problem: Safety: Goal: Ability to remain free from injury will improve Outcome: Progressing   Problem: Skin Integrity: Goal: Risk for impaired skin integrity will decrease Outcome: Progressing

## 2020-10-16 NOTE — Plan of Care (Signed)
  Problem: Clinical Measurements: Goal: Ability to maintain clinical measurements within normal limits will improve Outcome: Progressing Goal: Will remain free from infection Outcome: Progressing   Problem: Nutrition: Goal: Adequate nutrition will be maintained Outcome: Progressing   

## 2020-10-16 NOTE — Progress Notes (Signed)
   10/16/20 1858  Assess: MEWS Score  Temp 100.1 F (37.8 C)  BP (!) 216/83  Pulse Rate 90  Resp 19  Level of Consciousness Alert  SpO2 99 %  O2 Device Room Air  Assess: MEWS Score  MEWS Temp 0  MEWS Systolic 2  MEWS Pulse 0  MEWS RR 0  MEWS LOC 0  MEWS Score 2  MEWS Score Color Yellow  Assess: if the MEWS score is Yellow or Red  Were vital signs taken at a resting state? Yes  Focused Assessment No change from prior assessment  Early Detection of Sepsis Score *See Row Information* Low  MEWS guidelines implemented *See Row Information* Yes  Treat  MEWS Interventions Administered scheduled meds/treatments  Pain Scale 0-10  Pain Score 0  Take Vital Signs  Increase Vital Sign Frequency  Yellow: Q 2hr X 2 then Q 4hr X 2, if remains yellow, continue Q 4hrs  Escalate  MEWS: Escalate Yellow: discuss with charge nurse/RN and consider discussing with provider and RRT  Notify: Charge Nurse/RN  Name of Charge Nurse/RN Notified Blanch Media, RN  Date Charge Nurse/RN Notified 10/16/20  Time Charge Nurse/RN Notified 1922  Notify: Provider  Provider Name/Title J. Olena Heckle  Date Provider Notified 10/16/20  Time Provider Notified 1929  Notification Type Page  Notification Reason Other (Comment) (BP elevated upon returning to floor from HD)  Notify: Rapid Response  Name of Rapid Response RN Notified n/a  Document  Patient Outcome Other (Comment) (RN will recheck BP)  Progress note created (see row info) Yes    Received Pt on bed from HD. Pt not in acute distress, denies chest pain and SOB, no pain to surgical site. Agricultural consultant, J. Daniels-APP paged, awaiting for orders. Endorsed Pt accordingly to [employer], Therapist, sports. Pt eating dinner at this time with no complaints.

## 2020-10-16 NOTE — TOC Progression Note (Signed)
Transition of Care [facility] [place]) - Progression Note    Patient Details  Name: Trevor [name] MRN: [id number] Date of Birth: 29-Nov-1940  Transition of Care [facility]) CM/SW [place], [place] Phone Number: 10/16/2020, 2:49 PM  Clinical Narrative:     Yates Decamp with [facility]; she will review pt and call CSW back.     Expected Discharge Plan: Skilled Nursing Facility Barriers to Discharge: Ship broker, SNF Pending bed offer  Expected Discharge Plan and Services Expected Discharge Plan: [place] arrangements for the past 2 months: Single Family Home                                       Social Determinants of Health (SDOH) Interventions    Readmission Risk Interventions No flowsheet data found.

## 2020-10-16 NOTE — Progress Notes (Signed)
[facility] NEPHROLOGY PROGRESS NOTE  Assessment/ Plan: Pt is a 80 y.o. yo male  with history of hypertension, DM, HLD, nephrolithiasis, ESRD on HD MWF at [facility] followed by Dr. Theador Hawthorne, transferred from [facility] for displaced femoral neck fracture, seen as a consultation for the management of ESRD.  #Right femoral neck fracture: Seen by orthopedics and underwent right total hip arthroplasty on 6/29.  PT OT and rehab per primary and orthopedics team.    # ESRD: MWF at [facility].  Right IJ TDC for access. HD today, next tx 7/8   # Hypertension: On antihypertensive.  Volume management by ultrafiltration.   # Anemia of ESRD: Hemoglobin stable.  Need outpatient record about ESA treatment.  Iron saturation 10%--iv iron and esa ordered. Monitor hemoglobin, transfused for hgb <7   # Metabolic Bone Disease: Calcium and phosphorus level at goal.  #Altered mental status: Seen by neurology.  MRI brain with chronic microvascular ischemic changes and chronic pontine infarct without evidence of acute finding.   He looks alert awake and around his baseline.  #Hyponatremia/hypokalemia: Managed by dialysis.   Subjective: Seen and examined at bedside.  No acute events/complaints. Open for hd today. Objective Vital signs in last 24 hours: Vitals:   10/15/20 0700 10/15/20 1700 10/15/20 2156 10/16/20 0700  BP: (!) 161/75 (!) 162/67 (!) 148/65 (!) 158/60  Pulse: 67 68 68 62  Resp: 18 17 16 18   Temp: 98.6 F (37 C) 99.7 F (37.6 C) 98.6 F (37 C) 99 F (37.2 C)  TempSrc: Oral Oral Oral Oral  SpO2: 100% 96% 100% 100%  Weight:      Height:       Weight change:   Intake/Output Summary (Last 24 hours) at 10/16/2020 1025 Last data filed at 10/16/2020 0836 Gross per 24 hour  Intake 720 ml  Output 550 ml  Net 170 ml       Labs: Basic Metabolic Panel: Recent Labs  Lab 10/14/20 1753 10/15/20 0321 10/16/20 0222  NA 134* 131* 133*  K 3.6 4.1 4.3  CL  98 95* 98  CO2 27 26 28   GLUCOSE 159* 236* 182*  BUN 29* 40* 50*  CREATININE 3.40* 4.48* 5.87*  CALCIUM 8.2* 8.1* 8.2*  PHOS 2.9 4.1 5.1*   Liver Function Tests: Recent Labs  Lab 10/14/20 1753 10/15/20 0321 10/16/20 0222  AST 17 15 13*  ALT 9 8 8   ALKPHOS 37* 29* 31*  BILITOT 0.9 0.4 0.6  PROT 6.6 5.5* 5.9*  ALBUMIN 2.6* 2.3* 2.4*   No results for input(s): LIPASE, AMYLASE in the last 168 hours. Recent Labs  Lab 10/10/20 1236  AMMONIA 21   CBC: Recent Labs  Lab 10/14/20 0133 10/14/20 1301 10/14/20 1753 10/15/20 0321 10/16/20 0222  WBC 9.5 10.6* 11.8* 7.4 8.4  NEUTROABS 7.4  --   --  5.6 6.2  HGB 8.3* 8.6* 9.1* 7.8* 7.8*  HCT 24.8* 26.6* 28.7* 24.4* 24.5*  MCV 93.2 92.4 92.3 93.8 94.6  PLT 275 299 312 254 270   Cardiac Enzymes: No results for input(s): CKTOTAL, CKMB, CKMBINDEX, TROPONINI in the last 168 hours. CBG: Recent Labs  Lab 10/15/20 0656 10/15/20 1130 10/15/20 1706 10/15/20 2156 10/16/20 0624  GLUCAP 160* 186* 179* 178* 156*    Iron Studies:  No results for input(s): IRON, TIBC, TRANSFERRIN, FERRITIN in the last 72 hours.  Studies/Results: No results found.  Medications: Infusions:  ferric gluconate (FERRLECIT) IVPB 125 mg (10/14/20 1645)    Scheduled  Medications:  aspirin EC  325 mg Oral Q breakfast   carvedilol  6.25 mg Oral BID WC   chlorhexidine  60 mL Topical Once   Chlorhexidine Gluconate Cloth  6 each Topical Q0600   Chlorhexidine Gluconate Cloth  6 each Topical Q0600   cholecalciferol  1,000 Units Oral Daily   cloNIDine  0.2 mg Oral TID   darbepoetin (ARANESP) injection - DIALYSIS  60 mcg Intravenous Q Mon-HD   heparin injection (subcutaneous)  5,000 Units Subcutaneous Q8H   hydrALAZINE  50 mg Oral BID   insulin aspart  0-6 Units Subcutaneous TID WC   insulin detemir  8 Units Subcutaneous QHS   isoniazid  300 mg Oral Daily   multivitamin  1 tablet Oral QHS   polyethylene glycol  17 g Oral BID   povidone-iodine  2  application Topical Once   povidone-iodine  2 application Topical Once   pyridOXINE  50 mg Oral Daily   rosuvastatin  10 mg Oral Daily   senna-docusate  2 tablet Oral BID    have reviewed scheduled and prn medications.  Physical Exam: General: nad Heart:RRR, s1s2 nl Lungs: cta b/l Abdomen:soft, Non-tender, non-distended Extremities:No LE edema Dialysis Access: Right IJ TDC.  Khyleigh Furney 10/16/2020,10:25 AM  LOS: 8 days

## 2020-10-17 LAB — RESP PANEL BY RT-PCR (FLU A&B, COVID) ARPGX2
Influenza A by PCR: NEGATIVE
Influenza B by PCR: NEGATIVE
SARS Coronavirus 2 by RT PCR: NEGATIVE

## 2020-10-17 LAB — GLUCOSE, CAPILLARY
Glucose-Capillary: 204 mg/dL — ABNORMAL HIGH (ref 70–99)
Glucose-Capillary: 149 mg/dL — ABNORMAL HIGH (ref 70–99)
Glucose-Capillary: 180 mg/dL — ABNORMAL HIGH (ref 70–99)
Glucose-Capillary: 285 mg/dL — ABNORMAL HIGH (ref 70–99)
Glucose-Capillary: 150 mg/dL — ABNORMAL HIGH (ref 70–99)

## 2020-10-17 MED ORDER — POLYETHYLENE GLYCOL 3350 17 G PO PACK: 17.0000 g | PACK | Freq: Two times a day (BID) | ORAL | Status: DC | PRN

## 2020-10-17 MED ORDER — SENNOSIDES-DOCUSATE SODIUM 8.6-50 MG PO TABS
2.0000 | ORAL_TABLET | Freq: Every day | ORAL | Status: DC
  Administered 2020-10-18: 2 via ORAL
  Filled 2020-10-17: qty 2

## 2020-10-17 NOTE — Progress Notes (Signed)
Inpatient Rehabilitation Admissions Coordinator   I met with patient at bedside and he is aware that [facility] has denied Cir admit. He is working with SW on SNF. We will sign off at this time.  Danne Baxter, RN, MSN Rehab Admissions Coordinator [phone] 10/17/2020 11:43 AM

## 2020-10-17 NOTE — Progress Notes (Signed)
Physical Therapy Treatment Patient Details Name: Trevor [name] MRN: [id number] DOB: 11-Aug-1940 Today's Date: 10/17/2020    History of Present Illness 80 yo male sustained a fall coming to hosp for placement of HD catheter, sustained a fall on 09/07/20.  Did not get imaging on his hip until later, found R femoral neck fracture.  Now has R THA with direct anterior approach from 6/29, referred to PT.  PMHx:  ESRD on HD, anemia, CKD, DM, HLD, HTN, kidney stones    PT Comments    Pt supine in bed on arrival this session.  Pt required assistance to move OOB and into standing but continues to improve.  He was denied CIR placement so will update recommendations to SNF based on his history of falls to improve strength, function and safety before returning home.     Follow Up Recommendations  SNF     Equipment Recommendations  Rolling walker with 5" wheels    Recommendations for Other Services       Precautions / Restrictions Precautions Precautions: Fall Restrictions Weight Bearing Restrictions: Yes RLE Weight Bearing: Weight bearing as tolerated    Mobility  Bed Mobility Overal bed mobility: Needs Assistance Bed Mobility: Supine to Sit     Supine to sit: Min guard     General bed mobility comments: Min guard for safety    Transfers Overall transfer level: Needs assistance Equipment used: Rolling walker (2 wheeled) Transfers: Sit to/from Stand Sit to Stand: Min guard         General transfer comment: Cues for hand placement and safety.  Ambulation/Gait Ambulation/Gait assistance: Min guard Gait Distance (Feet): 81 Feet Assistive device: Rolling walker (2 wheeled) Gait Pattern/deviations: Step-through pattern;Decreased weight shift to right;Antalgic;Trunk flexed Gait velocity: decr   General Gait Details: Min cues for RW proximity and posture, pain limited further distance   Stairs             Wheelchair Mobility    Modified Rankin (Stroke Patients  Only)       Balance Overall balance assessment: Needs assistance;History of Falls Sitting-balance support: No upper extremity supported;Feet supported Sitting balance-Leahy Scale: Good       Standing balance-Leahy Scale: Poor Standing balance comment: using RW and min guard                            Cognition Arousal/Alertness: Awake/alert Behavior During Therapy: WFL for tasks assessed/performed Overall Cognitive Status: Within Functional Limits for tasks assessed                                 General Comments: motivated      Exercises General Exercises - Lower Extremity Ankle Circles/Pumps: AROM;Both;10 reps;Supine Quad Sets: AROM;Right;10 reps;Supine Long Arc Quad: AROM;Right;10 reps;Seated Heel Slides: AAROM;Right;10 reps;Supine Hip ABduction/ADduction: AAROM;Right;10 reps;Supine    General Comments        Pertinent Vitals/Pain Pain Assessment: 0-10 Pain Score: 3  Pain Location: R hip Pain Descriptors / Indicators: Discomfort;Sore Pain Intervention(s): Monitored during session;Repositioned    Home Living                      Prior Function            PT Goals (current goals can now be found in the care plan section) Acute Rehab PT Goals Patient Stated Goal: home in 1-2 weeks Potential to Achieve  Goals: Good Progress towards PT goals: Progressing toward goals    Frequency    Min 4X/week      PT Plan Discharge plan needs to be updated    Co-evaluation              AM-PAC PT "6 Clicks" Mobility   Outcome Measure  Help needed turning from your back to your side while in a flat bed without using bedrails?: A Little Help needed moving from lying on your back to sitting on the side of a flat bed without using bedrails?: A Little Help needed moving to and from a bed to a chair (including a wheelchair)?: A Little Help needed standing up from a chair using your arms (e.g., wheelchair or bedside chair)?: A  Little Help needed to walk in hospital room?: A Little Help needed climbing 3-5 steps with a railing? : A Little 6 Click Score: 18    End of Session Equipment Utilized During Treatment: Gait belt Activity Tolerance: Patient tolerated treatment well Patient left: with call bell/phone within reach;in chair;with chair alarm set Nurse Communication: Mobility status PT Visit Diagnosis: Unsteadiness on feet (R26.81);Muscle weakness (generalized) (M62.81);Pain;Difficulty in walking, not elsewhere classified (R26.2);Repeated falls (R29.6);History of falling (Z91.81) Pain - Right/Left: Right Pain - part of body: Hip     Time: 1962-2297 PT Time Calculation (min) (ACUTE ONLY): 15 min  Charges:  $Gait Training: 8-22 mins                     Erasmo Leventhal , PTA Acute Rehabilitation Services Pager [phone] Office [phone]    Trevor [name] 10/17/2020, 6:06 PM

## 2020-10-17 NOTE — Progress Notes (Signed)
[facility] NEPHROLOGY PROGRESS NOTE  Assessment/ Plan: Pt is a 80 y.o. yo male  with history of hypertension, DM, HLD, nephrolithiasis, ESRD on HD MWF at [facility] followed by Dr. Theador Hawthorne, transferred from [facility] for displaced femoral neck fracture, seen as a consultation for the management of ESRD.  #Right femoral neck fracture: Seen by orthopedics and underwent right total hip arthroplasty on 6/29.  PT OT and rehab per primary and orthopedics team.    # ESRD: MWF at [facility].  Right IJ TDC for access. next tx 7/8   # Hypertension: On antihypertensive.  Volume management by ultrafiltration. If needed, can inc hydralazine to 100mg  tid   # Anemia of ESRD: Hemoglobin stable.  Need outpatient record about ESA treatment.  Iron saturation 10%--iv iron and esa ordered. Monitor hemoglobin, transfused for hgb <7   # Metabolic Bone Disease: Calcium and phosphorus level at goal.  #Altered mental status: Seen by neurology.  MRI brain with chronic microvascular ischemic changes and chronic pontine infarct without evidence of acute finding.   He looks alert awake and around his baseline.  #Hyponatremia/hypokalemia: Managed by dialysis.   Subjective: Seen and examined at bedside.  No acute events/complaints. Tolerated hd yesterday but then was hypertensive which was odd. Bp improved after HD. Net uf 3L. Very eager to go home. Objective Vital signs in last 24 hours: Vitals:   10/16/20 1947 10/16/20 2158 10/17/20 0000 10/17/20 0822  BP: (!) 153/69 (!) 154/54 (!) 145/48 (!) 147/60  Pulse: 84 70 65 63  Resp: 17 18 18    Temp: 99.5 F (37.5 C) 99.4 F (37.4 C) 98.9 F (37.2 C) 98.4 F (36.9 C)  TempSrc: Oral Oral Oral Oral  SpO2: 97% 100% 100% 100%  Weight:      Height:       Weight change:   Intake/Output Summary (Last 24 hours) at 10/17/2020 1132 Last data filed at 10/17/2020 7619 Gross per 24 hour  Intake 660 ml  Output 3230 ml  Net -2570 ml        Labs: Basic Metabolic Panel: Recent Labs  Lab 10/14/20 1753 10/15/20 0321 10/16/20 0222  NA 134* 131* 133*  K 3.6 4.1 4.3  CL 98 95* 98  CO2 27 26 28   GLUCOSE 159* 236* 182*  BUN 29* 40* 50*  CREATININE 3.40* 4.48* 5.87*  CALCIUM 8.2* 8.1* 8.2*  PHOS 2.9 4.1 5.1*   Liver Function Tests: Recent Labs  Lab 10/14/20 1753 10/15/20 0321 10/16/20 0222  AST 17 15 13*  ALT 9 8 8   ALKPHOS 37* 29* 31*  BILITOT 0.9 0.4 0.6  PROT 6.6 5.5* 5.9*  ALBUMIN 2.6* 2.3* 2.4*   No results for input(s): LIPASE, AMYLASE in the last 168 hours. Recent Labs  Lab 10/10/20 1236  AMMONIA 21   CBC: Recent Labs  Lab 10/14/20 0133 10/14/20 1301 10/14/20 1753 10/15/20 0321 10/16/20 0222  WBC 9.5 10.6* 11.8* 7.4 8.4  NEUTROABS 7.4  --   --  5.6 6.2  HGB 8.3* 8.6* 9.1* 7.8* 7.8*  HCT 24.8* 26.6* 28.7* 24.4* 24.5*  MCV 93.2 92.4 92.3 93.8 94.6  PLT 275 299 312 254 270   Cardiac Enzymes: No results for input(s): CKTOTAL, CKMB, CKMBINDEX, TROPONINI in the last 168 hours. CBG: Recent Labs  Lab 10/16/20 1120 10/16/20 1853 10/16/20 2111 10/17/20 0337 10/17/20 0624  GLUCAP 171* 167* 219* 204* 149*    Iron Studies:  No results for input(s): IRON, TIBC, TRANSFERRIN, FERRITIN in the  last 72 hours.  Studies/Results: No results found.  Medications: Infusions:  ferric gluconate (FERRLECIT) IVPB 125 mg (10/16/20 1755)    Scheduled Medications:  aspirin EC  325 mg Oral Q breakfast   carvedilol  6.25 mg Oral BID WC   chlorhexidine  60 mL Topical Once   Chlorhexidine Gluconate Cloth  6 each Topical Q0600   Chlorhexidine Gluconate Cloth  6 each Topical Q0600   cholecalciferol  1,000 Units Oral Daily   cloNIDine  0.2 mg Oral TID   darbepoetin (ARANESP) injection - DIALYSIS  60 mcg Intravenous Q Mon-HD   heparin injection (subcutaneous)  5,000 Units Subcutaneous Q8H   hydrALAZINE  50 mg Oral BID   insulin aspart  0-6 Units Subcutaneous TID WC   insulin detemir  8 Units  Subcutaneous QHS   isoniazid  300 mg Oral Daily   multivitamin  1 tablet Oral QHS   polyethylene glycol  17 g Oral BID   povidone-iodine  2 application Topical Once   povidone-iodine  2 application Topical Once   pyridOXINE  50 mg Oral Daily   rosuvastatin  10 mg Oral Daily   senna-docusate  2 tablet Oral BID    have reviewed scheduled and prn medications.  Physical Exam: General: nad Heart:RRR, s1s2 nl Lungs: cta b/l Abdomen:soft, Non-tender, non-distended Extremities:No LE edema Dialysis Access: Right IJ TDC.  Alaylah Heatherington 10/17/2020,11:32 AM  LOS: 9 days

## 2020-10-17 NOTE — TOC Progression Note (Signed)
Transition of Care [facility]) - Progression Note    Patient Details  Name: Trevor [name] MRN: [id number] Date of Birth: 1940/06/03  Transition of Care [facility]) CM/SW [place], [place] Phone Number: 10/17/2020, 10:44 AM  Clinical Narrative:     Jackelyn Poling with [facility] confirmed they can take pt tomorrow pending insurance auth. CSW confirmed plan with pt. He has 2 covid vaccines and no boosters. Debbie with [facility] has started insurance auth. Planning for pt to dc tomorrow after HD pending insurance auth.   Expected Discharge Plan: Skilled Nursing Facility Barriers to Discharge: Ship broker, SNF Pending bed offer  Expected Discharge Plan and Services Expected Discharge Plan: [place] arrangements for the past 2 months: Single Family Home                                       Social Determinants of Health (SDOH) Interventions    Readmission Risk Interventions No flowsheet data found.

## 2020-10-17 NOTE — Plan of Care (Signed)

## 2020-10-17 NOTE — Progress Notes (Signed)
PROGRESS NOTE  Trevor [name] YSA:[id number] DOB: Jun 04, 1940 DOA: 10/08/2020 PCP: Celene Squibb, MD   LOS: 9 days   Brief Narrative / Interim history: 80 year old male with history of ESRD on HD, DM 2, comes into the hospital with hip pain and inability to ambulate.  Patient went to [facility] on 5/28 to get HD port replacement but felt dizzy and fell.  Initial imaging was negative for injury however he had persistent severe right hip pain, saw orthopedics as an outpatient and a hip x-ray showed displaced femoral neck fracture.  He was admitted to the hospital is now status post repair.  Subjective / 24h Interval events: No complaints  Assessment & Plan: Principal Problem Right closed displaced femoral neck fracture-orthopedic surgery consulted, underwent right total hip arthroplasty on 10/09/2020.  Insurance denied CIR, pursue SNF  Active Problems Acute metabolic encephalopathy /polymyoclonus -patient had an event during this hospital stay when he returned from dialysis he was confused.  Neurology consulted, underwent an MRI of the brain which showed no acute findings.  He was believed to be narcotic induced as he responded to Narcan.  He is now back to baseline  ESRD on HD-nephrology following, continue HD as scheduled  Hypokalemia, hyponatremia-managed with dialysis  Essential hypertension-continue home regimen  Hyperlipidemia-continue statin  Anemia of chronic kidney disease-hemoglobin stable  DM2-A1c 6.2.  Continue sliding scale  CBG (last 3)  Recent Labs    10/17/20 0337 10/17/20 0624 10/17/20 1152  GLUCAP 204* 149* 180*    Latent TB -C/w Isoniazide 300 mg po Daily and Pyridoxine 50 mg  Obesity -he would benefit from weight loss    Scheduled Meds:  aspirin EC  325 mg Oral Q breakfast   carvedilol  6.25 mg Oral BID WC   chlorhexidine  60 mL Topical Once   Chlorhexidine Gluconate Cloth  6 each Topical Q0600   Chlorhexidine Gluconate Cloth  6 each Topical Q0600    cholecalciferol  1,000 Units Oral Daily   cloNIDine  0.2 mg Oral TID   darbepoetin (ARANESP) injection - DIALYSIS  60 mcg Intravenous Q Mon-HD   heparin injection (subcutaneous)  5,000 Units Subcutaneous Q8H   hydrALAZINE  50 mg Oral BID   insulin aspart  0-6 Units Subcutaneous TID WC   insulin detemir  8 Units Subcutaneous QHS   isoniazid  300 mg Oral Daily   multivitamin  1 tablet Oral QHS   polyethylene glycol  17 g Oral BID   povidone-iodine  2 application Topical Once   povidone-iodine  2 application Topical Once   pyridOXINE  50 mg Oral Daily   rosuvastatin  10 mg Oral Daily   senna-docusate  2 tablet Oral BID   Continuous Infusions:  ferric gluconate (FERRLECIT) IVPB 125 mg (10/16/20 1755)   PRN Meds:.bisacodyl, hydrALAZINE, HYDROcodone-acetaminophen, HYDROmorphone (DILAUDID) injection, menthol-cetylpyridinium **OR** phenol, metoCLOPramide **OR** metoCLOPramide (REGLAN) injection, naLOXone (NARCAN)  injection, ondansetron **OR** ondansetron (ZOFRAN) IV  Diet Orders (From admission, onward)     Start     Ordered   10/09/20 2110  Diet Carb Modified Fluid consistency: Thin; Room service appropriate? Yes  Diet effective now       Question Answer Comment  Calorie Level Medium 1600-2000   Fluid consistency: Thin   Room service appropriate? Yes      10/09/20 2109            DVT prophylaxis: heparin injection 5,000 Units Start: 10/13/20 1415 SCDs Start: 10/09/20 2110     Code Status: Full  Code  Family Communication: no family at bedside   Status is: Inpatient  Remains inpatient appropriate because:Unsafe d/c plan  Dispo: The patient is from: Home              Anticipated d/c is to: CIR              Patient currently is not medically stable to d/c.   Difficult to place patient No  Level of care: Med-Surg  Consultants:  Orthopedic surgery  Procedures:  Hip surgery  Microbiology  None  Antimicrobials: None   Objective: Vitals:   10/16/20 1947  10/16/20 2158 10/17/20 0000 10/17/20 0822  BP: (!) 153/69 (!) 154/54 (!) 145/48 (!) 147/60  Pulse: 84 70 65 63  Resp: 17 18 18    Temp: 99.5 F (37.5 C) 99.4 F (37.4 C) 98.9 F (37.2 C) 98.4 F (36.9 C)  TempSrc: Oral Oral Oral Oral  SpO2: 97% 100% 100% 100%  Weight:      Height:        Intake/Output Summary (Last 24 hours) at 10/17/2020 1259 Last data filed at 10/17/2020 0258 Gross per 24 hour  Intake 460 ml  Output 3110 ml  Net -2650 ml    Filed Weights   10/14/20 1713 10/16/20 1420 10/16/20 1812  Weight: 98.8 kg 100.3 kg 97.3 kg    Examination:  Constitutional: NAD Respiratory: Clear without wheezing or crackles Cardiovascular: Regular rate and rhythm, no edema  Data Reviewed: I have independently reviewed following labs and imaging studies   CBC: Recent Labs  Lab 10/12/20 0200 10/13/20 0215 10/14/20 0133 10/14/20 1301 10/14/20 1753 10/15/20 0321 10/16/20 0222  WBC 10.4 9.9 9.5 10.6* 11.8* 7.4 8.4  NEUTROABS 8.1* 7.7 7.4  --   --  5.6 6.2  HGB 8.4* 8.4* 8.3* 8.6* 9.1* 7.8* 7.8*  HCT 26.0* 25.7* 24.8* 26.6* 28.7* 24.4* 24.5*  MCV 94.9 94.1 93.2 92.4 92.3 93.8 94.6  PLT 201 207 275 299 312 254 527    Basic Metabolic Panel: Recent Labs  Lab 10/13/20 0215 10/14/20 0133 10/14/20 1753 10/15/20 0321 10/16/20 0222  NA 129* 129* 134* 131* 133*  K 3.9 4.4 3.6 4.1 4.3  CL 96* 97* 98 95* 98  CO2 24 25 27 26 28   GLUCOSE 244* 232* 159* 236* 182*  BUN 45* 63* 29* 40* 50*  CREATININE 5.10* 6.20* 3.40* 4.48* 5.87*  CALCIUM 8.0* 8.1* 8.2* 8.1* 8.2*  MG 1.8 1.7 1.8 1.9 2.0  PHOS 5.1* 5.2* 2.9 4.1 5.1*    Liver Function Tests: Recent Labs  Lab 10/13/20 0215 10/14/20 0133 10/14/20 1753 10/15/20 0321 10/16/20 0222  AST 16 12* 17 15 13*  ALT 9 7 9 8 8   ALKPHOS 32* 31* 37* 29* 31*  BILITOT 0.7 0.5 0.9 0.4 0.6  PROT 5.6* 5.6* 6.6 5.5* 5.9*  ALBUMIN 2.3* 2.3* 2.6* 2.3* 2.4*    Coagulation Profile: No results for input(s): INR, PROTIME in the last 168  hours. HbA1C: No results for input(s): HGBA1C in the last 72 hours. CBG: Recent Labs  Lab 10/16/20 1853 10/16/20 2111 10/17/20 0337 10/17/20 0624 10/17/20 1152  GLUCAP 167* 219* 204* 149* 180*     Recent Results (from the past 240 hour(s))  Resp Panel by RT-PCR (Flu A&B, Covid) Nasopharyngeal Swab     Status: None   Collection Time: 10/08/20  1:44 PM   Specimen: Nasopharyngeal Swab; Nasopharyngeal(NP) swabs in vial transport medium  Result Value Ref Range Status   SARS Coronavirus 2 by RT  PCR NEGATIVE NEGATIVE Final    Comment: (NOTE) SARS-CoV-2 target nucleic acids are NOT DETECTED.  The SARS-CoV-2 RNA is generally detectable in upper respiratory specimens during the acute phase of infection. The lowest concentration of SARS-CoV-2 viral copies this assay can detect is 138 copies/mL. A negative result does not preclude SARS-Cov-2 infection and should not be used as the sole basis for treatment or other patient management decisions. A negative result may occur with  improper specimen collection/handling, submission of specimen other than nasopharyngeal swab, presence of viral mutation(s) within the areas targeted by this assay, and inadequate number of viral copies(<138 copies/mL). A negative result must be combined with clinical observations, patient history, and epidemiological information. The expected result is Negative.  Fact Sheet for Patients:  EntrepreneurPulse.com.au  Fact Sheet for Healthcare Providers:  IncredibleEmployment.be  This test is no t yet approved or cleared by the [place] FDA and  has been authorized for detection and/or diagnosis of SARS-CoV-2 by FDA under an Emergency Use Authorization (EUA). This EUA will remain  in effect (meaning this test can be used) for the duration of the COVID-19 declaration under Section 564(b)(1) of the Act, 21 U.S.C.section 360bbb-3(b)(1), unless the authorization is terminated   or revoked sooner.       Influenza A by PCR NEGATIVE NEGATIVE Final   Influenza B by PCR NEGATIVE NEGATIVE Final    Comment: (NOTE) The Xpert Xpress SARS-CoV-2/FLU/RSV plus assay is intended as an aid in the diagnosis of influenza from Nasopharyngeal swab specimens and should not be used as a sole basis for treatment. Nasal washings and aspirates are unacceptable for Xpert Xpress SARS-CoV-2/FLU/RSV testing.  Fact Sheet for Patients: EntrepreneurPulse.com.au  Fact Sheet for Healthcare Providers: IncredibleEmployment.be  This test is not yet approved or cleared by the [place] FDA and has been authorized for detection and/or diagnosis of SARS-CoV-2 by FDA under an Emergency Use Authorization (EUA). This EUA will remain in effect (meaning this test can be used) for the duration of the COVID-19 declaration under Section 564(b)(1) of the Act, 21 U.S.C. section 360bbb-3(b)(1), unless the authorization is terminated or revoked.  Performed at [facility], [address], [place], [place] [address]   Surgical pcr screen     Status: None   Collection Time: 10/09/20  3:58 AM   Specimen: Nasal Mucosa; Nasal Swab  Result Value Ref Range Status   MRSA, PCR NEGATIVE NEGATIVE Final   Staphylococcus aureus NEGATIVE NEGATIVE Final    Comment: (NOTE) The Xpert SA Assay (FDA approved for NASAL specimens in patients 14 years of age and older), is one component of a comprehensive surveillance program. It is not intended to diagnose infection nor to guide or monitor treatment. Performed at [facility], [place] [address] [place], [place] [address]       Radiology Studies: No results found.  Marzetta Board, MD, PhD [facility]  Between 7 am - 7 pm I am available, please contact me via Amion (for emergencies) or Securechat (non urgent messages)  Between 7 pm - 7 am I am not available, please contact night coverage MD/APP via Amion

## 2020-10-18 DIAGNOSIS — S72001D Fracture of unspecified part of neck of right femur, subsequent encounter for closed fracture with routine healing: Secondary | ICD-10-CM

## 2020-10-18 DIAGNOSIS — R600 Localized edema: Secondary | ICD-10-CM

## 2020-10-18 LAB — RENAL FUNCTION PANEL
Albumin: 2.7 g/dL — ABNORMAL LOW (ref 3.5–5.0)
Anion gap: 9 (ref 5–15)
BUN: 48 mg/dL — ABNORMAL HIGH (ref 8–23)
CO2: 26 mmol/L (ref 22–32)
Calcium: 8.4 mg/dL — ABNORMAL LOW (ref 8.9–10.3)
Chloride: 94 mmol/L — ABNORMAL LOW (ref 98–111)
Creatinine, Ser: 6.03 mg/dL — ABNORMAL HIGH (ref 0.61–1.24)
GFR, Estimated: 9 mL/min — ABNORMAL LOW (ref >60)
Glucose, Bld: 187 mg/dL — ABNORMAL HIGH (ref 70–99)
Phosphorus: 4.3 mg/dL (ref 2.5–4.6)
Potassium: 4.4 mmol/L (ref 3.5–5.1)
Sodium: 129 mmol/L — ABNORMAL LOW (ref 135–145)

## 2020-10-18 LAB — GLUCOSE, CAPILLARY
Glucose-Capillary: 174 mg/dL — ABNORMAL HIGH (ref 70–99)
Glucose-Capillary: 234 mg/dL — ABNORMAL HIGH (ref 70–99)
Glucose-Capillary: 219 mg/dL — ABNORMAL HIGH (ref 70–99)
Glucose-Capillary: 189 mg/dL — ABNORMAL HIGH (ref 70–99)
Glucose-Capillary: 215 mg/dL — ABNORMAL HIGH (ref 70–99)

## 2020-10-18 LAB — CBC
HCT: 27.7 % — ABNORMAL LOW (ref 39.0–52.0)
Hemoglobin: 8.9 g/dL — ABNORMAL LOW (ref 13.0–17.0)
MCH: 30.6 pg (ref 26.0–34.0)
MCHC: 32.1 g/dL (ref 30.0–36.0)
MCV: 95.2 fL (ref 80.0–100.0)
Platelets: 404 10*3/uL — ABNORMAL HIGH (ref 150–400)
RBC: 2.91 MIL/uL — ABNORMAL LOW (ref 4.22–5.81)
RDW: 14.9 % (ref 11.5–15.5)
WBC: 8.8 10*3/uL (ref 4.0–10.5)
nRBC: 0 % (ref 0.0–0.2)

## 2020-10-18 MED ORDER — HEPARIN SODIUM (PORCINE) 1000 UNIT/ML IJ SOLN
INTRAMUSCULAR | Status: AC
  Administered 2020-10-18: 3800 [IU]
  Filled 2020-10-18: qty 4

## 2020-10-18 NOTE — Discharge Summary (Addendum)
Physician Discharge Summary  Trevor [name] XTG:[id number] DOB: 05/17/1940 DOA: 10/08/2020  PCP: Trevor Squibb, MD  Admit date: 10/08/2020 Discharge date: 10/19/2020  Admitted From: home Disposition:  SNF  Recommendations for Outpatient Follow-up:  Follow up with PCP in 1-2 weeks Follow-up with orthopedic surgery as scheduled  Home Health: none Equipment/Devices: none  Discharge Condition: stable CODE STATUS: Full code Diet recommendation: renal   HPI: Per admitting MD, Trevor [name] is a 80 yo male with pertinent medical history of ESRD on dialysis and controlled DM presenting to the ED with hip pain and inability to ambulate x1 month. Patient went to [facility] to get hemodialysis port replacement but felt dizzy and fell. At [facility] ED patient received a knee xray which revealed no injury. After port replacement and discharge, patient continued to experience severe R hip pain rendering him unable to walk for a month. He saw Trevor [name] of orthopedic surgery today who ordered a hip XR which revealed displaced femoral neck fracture. Patient needs surgery and will be transferred to [facility] for complex anesthesia with dialysis. Patient received dialysis three times a week.   Hospital Course / Discharge diagnoses: Principal Problem Right closed displaced femoral neck fracture-orthopedic surgery consulted, underwent right total hip arthroplasty on 10/09/2020.  He recovered well postoperatively, initially PT recommended CIR however insurance denied CIR, will discharge to SNF   Active Problems Acute metabolic encephalopathy /polymyoclonus -patient had an event during this hospital stay when he returned from dialysis he was confused.  Neurology consulted, underwent an MRI of the brain which showed no acute findings.  He was believed to be narcotic induced as he responded to Narcan.  He is now back to baseline ESRD on HD-nephrology following, continue HD as scheduled Hypokalemia, hyponatremia-managed with  dialysis Essential hypertension-continue home regimen Hyperlipidemia-continue statin Anemia of chronic kidney disease-hemoglobin stable DM2-A1c 6.2.  Continue home regimen  Latent TB -C/w Isoniazide 300 mg po Daily and Pyridoxine 50 mg Obesity -he would benefit from weight loss  Sepsis ruled out   Discharge Instructions   Allergies as of 10/19/2020   No Known Allergies      Medication List     TAKE these medications    Accu-Chek Softclix Lancets lancets   acetaminophen 500 MG tablet Commonly known as: TYLENOL Take 1,000 mg by mouth every 6 (six) hours as needed for moderate pain.   aspirin 81 MG chewable tablet Commonly known as: Aspirin Childrens Chew 1 tablet (81 mg total) by mouth 2 (two) times daily with a meal.   B-D SINGLE USE SWABS REGULAR Pads   carvedilol 3.125 MG tablet Commonly known as: COREG Take 6.25 mg by mouth 2 (two) times daily with a meal.   Cholecalciferol 25 MCG (1000 UT) tablet Take 1,000 Units by mouth daily.   cloNIDine 0.2 MG tablet Commonly known as: CATAPRES Take 0.2 mg by mouth 3 (three) times daily.   furosemide 40 MG tablet Commonly known as: LASIX Take 40 mg by mouth 2 (two) times daily as needed.   hydrALAZINE 25 MG tablet Commonly known as: APRESOLINE Take 2 tablets by mouth 2 (two) times daily.   HYDROcodone-acetaminophen 5-325 MG tablet Commonly known as: NORCO/VICODIN Take 1-2 tablets by mouth every 6 (six) hours as needed for moderate pain. What changed:  how much to take when to take this reasons to take this   isoniazid 300 MG tablet Commonly known as: NYDRAZID Take 1 tablet (300 mg total) by mouth daily.   OneTouch Verio  test strip Generic drug: glucose blood Use as instructed   OneTouch Verio w/Device Kit 1 each by Does not apply route as needed.   pyridOXINE 50 MG tablet Commonly known as: VITAMIN B-6 Take 1 tablet (50 mg total) by mouth daily.   rosuvastatin 10 MG tablet Commonly known as:  CRESTOR Take 10 mg by mouth daily.   sodium bicarbonate 650 MG tablet Take 650 mg by mouth 3 (three) times daily.   Tyler Aas FlexTouch 200 UNIT/ML FlexTouch Pen Generic drug: insulin degludec Inject 10 Units into the skin at bedtime. If sugar is 100 or over take 10 units        Follow-up Information     [name], Trevor Edelman, MD. Schedule an appointment as soon as possible for a visit in 2 week(s).   Specialty: Orthopedic Surgery Why: For wound re-check Contact information: [address] [place] [address] [phone]                 Consultations: Orthopedic surgery Nephrology   Procedures/Studies:  DG Ankle Complete Right  Result Date: 10/01/2020 Clinical:  Pain right ankle, fall 09-06-20 X-rays were done of the right ankle, three views. There is diffuse soft tissue swelling of the right ankle.  No fracture is noted.  Ankle mortise is normal.  Bone quality is good. Impression:  Soft tissue swelling of the right ankle, no fracture noted. Electronically Signed Trevor Kava, MD 6/21/20229:21 AM   MR BRAIN WO CONTRAST  Result Date: 10/11/2020 CLINICAL DATA:  Mental status change EXAM: MRI HEAD WITHOUT CONTRAST TECHNIQUE: Multiplanar, multiecho pulse sequences of the brain and surrounding structures were obtained without intravenous contrast. COMPARISON:  None. FINDINGS: Motion artifact is present. Brain: There is no acute infarction or intracranial hemorrhage. There is no intracranial mass, mass effect, or edema. There is no hydrocephalus or extra-axial fluid collection. Prominence of the ventricles and sulci reflects generalized parenchymal volume loss. Patchy and confluent areas of T2 hyperintensity in the supratentorial and pontine white matter nonspecific but may reflect mild to moderate chronic microvascular ischemic changes. There is a chronic infarct of the left parasagittal pons. Vascular: Major vessel flow voids at the skull base are preserved. Skull and  upper cervical spine: Normal marrow signal is preserved. Sinuses/Orbits: Paranasal sinuses are aerated. Orbits are unremarkable. Other: Sella is unremarkable.  Mastoid air cells are clear. IMPRESSION: No evidence of recent infarction, hemorrhage, or mass. Chronic microvascular ischemic changes.  Chronic pontine infarct. Electronically Signed   By: Macy Mis M.D.   On: 10/11/2020 13:29   Pelvis Portable  Result Date: 10/09/2020 CLINICAL DATA:  Postop EXAM: PORTABLE PELVIS 1-2 VIEWS COMPARISON:  10/09/2020, 10/08/2020 FINDINGS: Interval right hip replacement with intact hardware and normal alignment. Gas in the soft tissues consistent with recent surgery. IMPRESSION: Interval right hip replacement with expected postsurgical changes Electronically Signed   By: Donavan Foil M.D.   On: 10/09/2020 19:53   US Venous Img Lower Bilateral (DVT)  Result Date: 10/08/2020 CLINICAL DATA:  80 year old male with a history of edema EXAM: BILATERAL LOWER EXTREMITY VENOUS DOPPLER ULTRASOUND TECHNIQUE: Gray-scale sonography with graded compression, as well as color Doppler and duplex ultrasound were performed to evaluate the lower extremity deep venous systems from the level of the common femoral vein and including the common femoral, femoral, profunda femoral, popliteal and calf veins including the posterior tibial, peroneal and gastrocnemius veins when visible. The superficial great saphenous vein was also interrogated. Spectral Doppler was utilized to evaluate flow at rest and  with distal augmentation maneuvers in the common femoral, femoral and popliteal veins. COMPARISON:  None. FINDINGS: RIGHT LOWER EXTREMITY Common Femoral Vein: No evidence of thrombus. Normal compressibility, respiratory phasicity and response to augmentation. Saphenofemoral Junction: No evidence of thrombus. Normal compressibility and flow on color Doppler imaging. Profunda Femoral Vein: No evidence of thrombus. Normal compressibility and flow on  color Doppler imaging. Femoral Vein: No evidence of thrombus. Normal compressibility, respiratory phasicity and response to augmentation. Popliteal Vein: No evidence of thrombus. Normal compressibility, respiratory phasicity and response to augmentation. Calf Veins: No evidence of thrombus. Normal compressibility and flow on color Doppler imaging. Superficial Great Saphenous Vein: No evidence of thrombus. Normal compressibility and flow on color Doppler imaging. Other Findings:  None. LEFT LOWER EXTREMITY Common Femoral Vein: No evidence of thrombus. Normal compressibility, respiratory phasicity and response to augmentation. Saphenofemoral Junction: No evidence of thrombus. Normal compressibility and flow on color Doppler imaging. Profunda Femoral Vein: No evidence of thrombus. Normal compressibility and flow on color Doppler imaging. Femoral Vein: No evidence of thrombus. Normal compressibility, respiratory phasicity and response to augmentation. Popliteal Vein: No evidence of thrombus. Normal compressibility, respiratory phasicity and response to augmentation. Calf Veins: No evidence of thrombus. Normal compressibility and flow on color Doppler imaging. Superficial Great Saphenous Vein: No evidence of thrombus. Normal compressibility and flow on color Doppler imaging Other Findings:  None. IMPRESSION: Sonographic survey of the left lower extremity negative for DVT Electronically Signed   By: Corrie Mckusick D.O.   On: 10/08/2020 14:55   DG Chest Port 1 View  Result Date: 10/08/2020 CLINICAL DATA:  Fall. EXAM: PORTABLE CHEST 1 VIEW COMPARISON:  08/13/2020. FINDINGS: Dual-lumen catheter noted with tip over right atrium. Cardiomegaly. No pulmonary venous congestion. Low lung volumes with mild bibasilar atelectasis. No pleural effusion or pneumothorax. No acute bony abnormality. IMPRESSION: 1. Dual-lumen catheter noted with tip over right atrium. No pulmonary venous congestion. 2.  Low lung volumes with mild  bibasilar atelectasis. Electronically Signed   By: Marcello Moores  Register   On: 10/08/2020 13:18   DG C-Arm 1-60 Min  Result Date: 10/09/2020 CLINICAL DATA:  Total hip arthroplasty. EXAM: DG C-ARM 1-60 MIN; OPERATIVE RIGHT HIP WITH PELVIS FLUOROSCOPY TIME:  Fluoroscopy Time:  11 seconds. Radiation Exposure Index (if provided by the fluoroscopic device): 1.31 mGy. Number of Acquired Spot Images: 7 COMPARISON:  October 08, 2020. FINDINGS: Seven C-arm fluoroscopic images were obtained intraoperatively and submitted for post operative interpretation. These images demonstrate sequential changes associated with right total hip arthroplasty. No unexpected findings. No unexpected radiopaque foreign bodies. Please see the performing provider's procedural report for further detail. IMPRESSION: Right total hip arthroplasty. Electronically Signed   By: Margaretha Sheffield MD   On: 10/09/2020 17:40   DG HIP OPERATIVE UNILAT WITH PELVIS RIGHT  Result Date: 10/09/2020 CLINICAL DATA:  Total hip arthroplasty. EXAM: DG C-ARM 1-60 MIN; OPERATIVE RIGHT HIP WITH PELVIS FLUOROSCOPY TIME:  Fluoroscopy Time:  11 seconds. Radiation Exposure Index (if provided by the fluoroscopic device): 1.31 mGy. Number of Acquired Spot Images: 7 COMPARISON:  October 08, 2020. FINDINGS: Seven C-arm fluoroscopic images were obtained intraoperatively and submitted for post operative interpretation. These images demonstrate sequential changes associated with right total hip arthroplasty. No unexpected findings. No unexpected radiopaque foreign bodies. Please see the performing provider's procedural report for further detail. IMPRESSION: Right total hip arthroplasty. Electronically Signed   By: Margaretha Sheffield MD   On: 10/09/2020 17:40   DG HIP UNILAT W OR W/O PELVIS 2-3 VIEWS  RIGHT  Result Date: 10/08/2020 Clinical:  Fall May 27, hip pain X-rays were done of the right hip and pelvis. There is a displaced femoral neck fracture of the right hip with some  foreshortening.  Bone quality is good.  Femoral head well located within acetabulum. Impression:  Displaced femoral neck fracture of the right hip. Electronically Signed Trevor Kava, MD 6/28/20229:40 AM     Subjective: - no chest pain, shortness of breath, no abdominal pain, nausea or vomiting.   Discharge Exam: BP (!) 153/98 (BP Location: Right Arm)   Pulse 63   Temp 98.6 F (37 C) (Oral)   Resp 17   Ht _0  (1.803 m)   Wt 96 kg   SpO2 100%   BMI 29.52 kg/m   General: Pt is alert, awake, not in acute distress Cardiovascular: RRR, S1/S2 +, no rubs, no gallops Respiratory: CTA bilaterally, no wheezing, no rhonchi Abdominal: Soft, NT, ND, bowel sounds + Extremities: no edema, no cyanosis    The results of significant diagnostics from this hospitalization (including imaging, microbiology, ancillary and laboratory) are listed below for reference.     Microbiology: Recent Results (from the past 240 hour(s))  Resp Panel by RT-PCR (Flu A&B, Covid) Nasopharyngeal Swab     Status: None   Collection Time: 10/17/20  1:12 PM   Specimen: Nasopharyngeal Swab; Nasopharyngeal(NP) swabs in vial transport medium  Result Value Ref Range Status   SARS Coronavirus 2 by RT PCR NEGATIVE NEGATIVE Final    Comment: (NOTE) SARS-CoV-2 target nucleic acids are NOT DETECTED.  The SARS-CoV-2 RNA is generally detectable in upper respiratory specimens during the acute phase of infection. The lowest concentration of SARS-CoV-2 viral copies this assay can detect is 138 copies/mL. A negative result does not preclude SARS-Cov-2 infection and should not be used as the sole basis for treatment or other patient management decisions. A negative result may occur with  improper specimen collection/handling, submission of specimen other than nasopharyngeal swab, presence of viral mutation(s) within the areas targeted by this assay, and inadequate number of viral copies(<138 copies/mL). A negative result  must be combined with clinical observations, patient history, and epidemiological information. The expected result is Negative.  Fact Sheet for Patients:  EntrepreneurPulse.com.au  Fact Sheet for Healthcare Providers:  IncredibleEmployment.be  This test is no t yet approved or cleared by the [place] FDA and  has been authorized for detection and/or diagnosis of SARS-CoV-2 by FDA under an Emergency Use Authorization (EUA). This EUA will remain  in effect (meaning this test can be used) for the duration of the COVID-19 declaration under Section 564(b)(1) of the Act, 21 U.S.C.section 360bbb-3(b)(1), unless the authorization is terminated  or revoked sooner.       Influenza A by PCR NEGATIVE NEGATIVE Final   Influenza B by PCR NEGATIVE NEGATIVE Final    Comment: (NOTE) The Xpert Xpress SARS-CoV-2/FLU/RSV plus assay is intended as an aid in the diagnosis of influenza from Nasopharyngeal swab specimens and should not be used as a sole basis for treatment. Nasal washings and aspirates are unacceptable for Xpert Xpress SARS-CoV-2/FLU/RSV testing.  Fact Sheet for Patients: EntrepreneurPulse.com.au  Fact Sheet for Healthcare Providers: IncredibleEmployment.be  This test is not yet approved or cleared by the [place] FDA and has been authorized for detection and/or diagnosis of SARS-CoV-2 by FDA under an Emergency Use Authorization (EUA). This EUA will remain in effect (meaning this test can be used) for the duration of the COVID-19 declaration under Section 564(b)(1)  of the Act, 21 U.S.C. section 360bbb-3(b)(1), unless the authorization is terminated or revoked.  Performed at [facility], [place] [address] [place], [place] [address]      Labs: Basic Metabolic Panel: Recent Labs  Lab 10/13/20 0215 10/14/20 0133 10/14/20 1753 10/15/20 0321 10/16/20 0222 10/18/20 1210  NA 129* 129* 134*  131* 133* 129*  K 3.9 4.4 3.6 4.1 4.3 4.4  CL 96* 97* 98 95* 98 94*  CO2 _0 GLUCOSE 244* 232* 159* 236* 182* 187*  BUN 45* 63* 29* 40* 50* 48*  CREATININE 5.10* 6.20* 3.40* 4.48* 5.87* 6.03*  CALCIUM 8.0* 8.1* 8.2* 8.1* 8.2* 8.4*  MG 1.8 1.7 1.8 1.9 2.0  --   PHOS 5.1* 5.2* 2.9 4.1 5.1* 4.3   Liver Function Tests: Recent Labs  Lab 10/13/20 0215 10/14/20 0133 10/14/20 1753 10/15/20 0321 10/16/20 0222 10/18/20 1210  AST 16 12* 17 15 13*  --   ALT _1 --   ALKPHOS 32* 31* 37* 29* 31*  --   BILITOT 0.7 0.5 0.9 0.4 0.6  --   PROT 5.6* 5.6* 6.6 5.5* 5.9*  --   ALBUMIN 2.3* 2.3* 2.6* 2.3* 2.4* 2.7*   CBC: Recent Labs  Lab 10/13/20 0215 10/14/20 0133 10/14/20 1301 10/14/20 1753 10/15/20 0321 10/16/20 0222 10/18/20 1210  WBC 9.9 9.5 10.6* 11.8* 7.4 8.4 8.8  NEUTROABS 7.7 7.4  --   --  5.6 6.2  --   HGB 8.4* 8.3* 8.6* 9.1* 7.8* 7.8* 8.9*  HCT 25.7* 24.8* 26.6* 28.7* 24.4* 24.5* 27.7*  MCV 94.1 93.2 92.4 92.3 93.8 94.6 95.2  PLT 207 275 299 312 254 270 404*   CBG: Recent Labs  Lab 10/18/20 1206 10/18/20 1656 10/18/20 2135 10/19/20 0315 10/19/20 0632  GLUCAP 219* 189* 215* 226* 168*   Hgb A1c No results for input(s): HGBA1C in the last 72 hours. Lipid Profile No results for input(s): CHOL, HDL, LDLCALC, TRIG, CHOLHDL, LDLDIRECT in the last 72 hours. Thyroid function studies No results for input(s): TSH, T4TOTAL, T3FREE, THYROIDAB in the last 72 hours.  Invalid input(s): FREET3 Urinalysis    Component Value Date/Time   COLORURINE YELLOW 09/06/2020 1003   APPEARANCEUR CLEAR 09/06/2020 1003   LABSPEC 1.011 09/06/2020 1003   PHURINE 7.0 09/06/2020 1003   GLUCOSEU NEGATIVE 09/06/2020 1003   HGBUR NEGATIVE 09/06/2020 1003   [place] 09/06/2020 1003   [place] 09/06/2020 1003   PROTEINUR >=300 (A) 09/06/2020 1003   NITRITE NEGATIVE 09/06/2020 1003   LEUKOCYTESUR TRACE (A) 09/06/2020 1003    FURTHER DISCHARGE  INSTRUCTIONS:   Get Medicines reviewed and adjusted: Please take all your medications with you for your next visit with your Primary MD   Laboratory/radiological data: Please request your Primary MD to go over all hospital tests and procedure/radiological results at the follow up, please ask your Primary MD to get all Hospital records sent to his/her office.   In some cases, they will be blood work, cultures and biopsy results pending at the time of your discharge. Please request that your primary care M.D. goes through all the records of your hospital data and follows up on these results.   Also Note the following: If you experience worsening of your admission symptoms, develop shortness of breath, life threatening emergency, suicidal or homicidal thoughts you must seek medical attention immediately by calling 911 or calling your MD immediately  if symptoms less severe.  You must read complete instructions/literature along with all the possible adverse reactions/side effects for all the Medicines you take and that have been prescribed to you. Take any new Medicines after you have completely understood and accpet all the possible adverse reactions/side effects.    Do not drive when taking Pain medications or sleeping medications (Benzodaizepines)   Do not take more than prescribed Pain, Sleep and Anxiety Medications. It is not advisable to combine anxiety,sleep and pain medications without talking with your primary care practitioner   Special Instructions: If you have smoked or chewed Tobacco  in the last 2 yrs please stop smoking, stop any regular Alcohol  and or any Recreational drug use.   Wear Seat belts while driving.   Please note: You were cared for by a hospitalist during your hospital stay. Once you are discharged, your primary care physician will handle any further medical issues. Please note that NO REFILLS for any discharge medications will be authorized once you are discharged,  as it is imperative that you return to your primary care physician (or establish a relationship with a primary care physician if you do not have one) for your post hospital discharge needs so that they can reassess your need for medications and monitor your lab values.  Time coordinating discharge: 40 minutes  SIGNED:  Marzetta Board, MD, PhD 10/19/2020, 9:52 AM

## 2020-10-18 NOTE — Progress Notes (Signed)
[facility] NEPHROLOGY PROGRESS NOTE  Assessment/ Plan: Pt is a 80 y.o. yo male  with history of hypertension, DM, HLD, nephrolithiasis, ESRD on HD MWF at [facility] followed by Dr. Theador Hawthorne, transferred from [facility] for displaced femoral neck fracture, seen as a consultation for the management of ESRD.  #Right femoral neck fracture: Seen by orthopedics and underwent right total hip arthroplasty on 6/29.  PT OT and rehab per primary and orthopedics team.  Going to snf  # ESRD: MWF at [facility].  Right IJ TDC for access. Hd later today   # Hypertension: On antihypertensive.  Volume management by ultrafiltration. If needed, can inc hydralazine to 100mg  tid   # Anemia of ESRD:  Need outpatient record about ESA treatment.  Iron saturation 10%--iv iron and esa ordered. Monitor hemoglobin, transfused for hgb <7   # Metabolic Bone Disease: Calcium and phosphorus level at goal.  #Altered mental status: Seen by neurology.  MRI brain with chronic microvascular ischemic changes and chronic pontine infarct without evidence of acute finding.   He looks alert awake and around his baseline.  #Hyponatremia/hypokalemia: Managed by dialysis.   Subjective: Seen and examined at bedside.  No acute events/complaints. Possibly being d/c'ed today Objective Vital signs in last 24 hours: Vitals:   10/17/20 1710 10/17/20 2022 10/18/20 0825 10/18/20 1038  BP: 135/81 (!) 132/53 (!) 135/51 (!) 133/53  Pulse: 64 (!) 55 64 60  Resp: 18 16 16    Temp: 98.1 F (36.7 C) 98.1 F (36.7 C) 98.3 F (36.8 C)   TempSrc: Oral Oral Oral   SpO2: 100% 100% 100% 96%  Weight:      Height:       Weight change:   Intake/Output Summary (Last 24 hours) at 10/18/2020 1114 Last data filed at 10/18/2020 0837 Gross per 24 hour  Intake 360 ml  Output 200 ml  Net 160 ml       Labs: Basic Metabolic Panel: Recent Labs  Lab 10/14/20 1753 10/15/20 0321 10/16/20 0222  NA 134* 131* 133*   K 3.6 4.1 4.3  CL 98 95* 98  CO2 27 26 28   GLUCOSE 159* 236* 182*  BUN 29* 40* 50*  CREATININE 3.40* 4.48* 5.87*  CALCIUM 8.2* 8.1* 8.2*  PHOS 2.9 4.1 5.1*   Liver Function Tests: Recent Labs  Lab 10/14/20 1753 10/15/20 0321 10/16/20 0222  AST 17 15 13*  ALT 9 8 8   ALKPHOS 37* 29* 31*  BILITOT 0.9 0.4 0.6  PROT 6.6 5.5* 5.9*  ALBUMIN 2.6* 2.3* 2.4*   No results for input(s): LIPASE, AMYLASE in the last 168 hours. No results for input(s): AMMONIA in the last 168 hours.  CBC: Recent Labs  Lab 10/14/20 0133 10/14/20 1301 10/14/20 1753 10/15/20 0321 10/16/20 0222  WBC 9.5 10.6* 11.8* 7.4 8.4  NEUTROABS 7.4  --   --  5.6 6.2  HGB 8.3* 8.6* 9.1* 7.8* 7.8*  HCT 24.8* 26.6* 28.7* 24.4* 24.5*  MCV 93.2 92.4 92.3 93.8 94.6  PLT 275 299 312 254 270   Cardiac Enzymes: No results for input(s): CKTOTAL, CKMB, CKMBINDEX, TROPONINI in the last 168 hours. CBG: Recent Labs  Lab 10/17/20 1152 10/17/20 1649 10/17/20 2216 10/18/20 0433 10/18/20 0824  GLUCAP 180* 285* 150* 174* 234*    Iron Studies:  No results for input(s): IRON, TIBC, TRANSFERRIN, FERRITIN in the last 72 hours.  Studies/Results: No results found.  Medications: Infusions:  ferric gluconate (FERRLECIT) IVPB 125 mg (10/16/20 1755)  Scheduled Medications:  aspirin EC  325 mg Oral Q breakfast   carvedilol  6.25 mg Oral BID WC   chlorhexidine  60 mL Topical Once   Chlorhexidine Gluconate Cloth  6 each Topical Q0600   Chlorhexidine Gluconate Cloth  6 each Topical Q0600   cholecalciferol  1,000 Units Oral Daily   cloNIDine  0.2 mg Oral TID   darbepoetin (ARANESP) injection - DIALYSIS  60 mcg Intravenous Q Mon-HD   heparin injection (subcutaneous)  5,000 Units Subcutaneous Q8H   hydrALAZINE  50 mg Oral BID   insulin aspart  0-6 Units Subcutaneous TID WC   insulin detemir  8 Units Subcutaneous QHS   isoniazid  300 mg Oral Daily   multivitamin  1 tablet Oral QHS   povidone-iodine  2 application  Topical Once   povidone-iodine  2 application Topical Once   pyridOXINE  50 mg Oral Daily   rosuvastatin  10 mg Oral Daily   senna-docusate  2 tablet Oral QHS    have reviewed scheduled and prn medications.  Physical Exam: General: nad Heart:RRR, s1s2 nl Lungs: cta b/l Abdomen:soft, Non-tender, non-distended Extremities:No LE edema Dialysis Access: Right IJ TDC.  [name] 10/18/2020,11:14 AM  LOS: 10 days

## 2020-10-18 NOTE — Progress Notes (Signed)
Physical Therapy Treatment Patient Details Name: Trevor [name] MRN: [id number] DOB: [birth year] Today's Date: 10/18/2020    History of Present Illness 80 yo male sustained a fall coming to hosp for placement of HD catheter, sustained a fall on 09/07/20.  Did not get imaging on his hip until later, found R femoral neck fracture.  Now has R THA with direct anterior approach from 6/29, referred to PT.  PMHx:  ESRD on HD, anemia, CKD, DM, HLD, HTN, kidney stones    PT Comments    Pt supine in bed.  He is just recently back from HD and presents with increased fatigue and required increased assistance.  After HD activity tolerance is decreased.  He continues to benefit from skilled rehab in a post acute setting to improve strength and function before returning home.     Follow Up Recommendations  SNF     Equipment Recommendations  Rolling walker with 5" wheels    Recommendations for Other Services       Precautions / Restrictions Precautions Precautions: Fall Restrictions Weight Bearing Restrictions: Yes RLE Weight Bearing: Weight bearing as tolerated    Mobility  Bed Mobility Overal bed mobility: Needs Assistance Bed Mobility: Supine to Sit;Sit to Supine     Supine to sit: Supervision Sit to supine: Min assist   General bed mobility comments: Min assistance to lift RLE back to bed against gravity.    Transfers Overall transfer level: Needs assistance Equipment used: Rolling walker (2 wheeled) Transfers: Sit to/from Stand Sit to Stand: Min guard         General transfer comment: Cues for hand placement and safety.  Pt very tired from dialysis sesssion so performed standing trial at side of bed.  Ambulation/Gait Ambulation/Gait assistance: Min guard Gait Distance (Feet): 4 Feet (sidesteps to [facility].) Assistive device: Rolling walker (2 wheeled) Gait Pattern/deviations: Decreased weight shift to right;Antalgic;Trunk flexed;Step-to pattern Gait velocity: decr   General  Gait Details: Pt very fatigue performing only shuffling steps from side of bed.   Stairs             Wheelchair Mobility    Modified Rankin (Stroke Patients Only)       Balance Overall balance assessment: Needs assistance;History of Falls Sitting-balance support: No upper extremity supported;Feet supported Sitting balance-Leahy Scale: Good       Standing balance-Leahy Scale: Poor                              Cognition Arousal/Alertness: Awake/alert Behavior During Therapy: WFL for tasks assessed/performed Overall Cognitive Status: Within Functional Limits for tasks assessed                                 General Comments: motivated      Exercises General Exercises - Lower Extremity Ankle Circles/Pumps: AROM;Both;10 reps;Supine Quad Sets: AROM;Right;10 reps;Supine Long Arc Quad: AROM;Right;10 reps;Seated Heel Slides: AROM;Right;10 reps;Supine Hip Flexion/Marching: AROM;Right;10 reps;Standing    General Comments        Pertinent Vitals/Pain Pain Assessment: Faces Faces Pain Scale: Hurts a little bit Pain Location: R hip Pain Descriptors / Indicators: Discomfort;Sore Pain Intervention(s): Monitored during session;Repositioned    Home Living                      Prior Function            PT Goals (  current goals can now be found in the care plan section) Acute Rehab PT Goals Patient Stated Goal: home in 1-2 weeks Potential to Achieve Goals: Good Progress towards PT goals: Progressing toward goals    Frequency    Min 4X/week      PT Plan Current plan remains appropriate    Co-evaluation              AM-PAC PT "6 Clicks" Mobility   Outcome Measure  Help needed turning from your back to your side while in a flat bed without using bedrails?: A Little Help needed moving from lying on your back to sitting on the side of a flat bed without using bedrails?: A Little Help needed moving to and from a bed  to a chair (including a wheelchair)?: A Little Help needed standing up from a chair using your arms (e.g., wheelchair or bedside chair)?: A Little Help needed to walk in hospital room?: A Little Help needed climbing 3-5 steps with a railing? : A Little 6 Click Score: 18    End of Session Equipment Utilized During Treatment: Gait belt Activity Tolerance: Patient tolerated treatment well Patient left: with call bell/phone within reach;in chair;with chair alarm set Nurse Communication: Mobility status PT Visit Diagnosis: Unsteadiness on feet (R26.81);Muscle weakness (generalized) (M62.81);Pain;Difficulty in walking, not elsewhere classified (R26.2);Repeated falls (R29.6);History of falling (Z91.81) Pain - Right/Left: Right Pain - part of body: Hip     Time: 1740-1750 PT Time Calculation (min) (ACUTE ONLY): 10 min  Charges:  $Therapeutic Activity: 8-22 mins                     Erasmo Leventhal , PTA Acute Rehabilitation Services Pager [phone] Office [phone]    Jo Booze Eli Hose 10/18/2020, 6:05 PM

## 2020-10-18 NOTE — TOC Progression Note (Signed)
Transition of Care [facility]) - Progression Note    Patient Details  Name: Trevor [name] MRN: [id number] Date of Birth: Jan 26, 1941  Transition of Care [facility]) CM/SW [place],  Phone Number: 10/18/2020, 4:30 PM  Clinical Narrative:     Spoke with Debbie at [facility]; as of 7793, Josem Kaufmann has still not been received. Will plan DC for tomorrow. Jackelyn Poling will be working Saturday and can be reached at [phone]   Expected Discharge Plan: [place] Barriers to Discharge: Ship broker, SNF Pending bed offer  Expected Discharge Plan and Services Expected Discharge Plan: [place] arrangements for the past 2 months: Single Family Home                                       Social Determinants of Health (SDOH) Interventions    Readmission Risk Interventions No flowsheet data found.

## 2020-10-18 NOTE — Progress Notes (Signed)
PT Cancellation Note  Patient Details Name: Trevor [name] MRN: [id number] DOB: 1940-07-23   Cancelled Treatment:    Reason Eval/Treat Not Completed: (P) Patient at procedure or test/unavailable (Pt off unit for HD, will contine efforts per POC.)   Wendel Homeyer Eli Hose 10/18/2020, 1:32 PM  Erasmo Leventhal , PTA Acute Rehabilitation Services Pager [phone] Office [phone]

## 2020-10-18 NOTE — Progress Notes (Signed)
Occupational Therapy Treatment Patient Details Name: Trevor [name] MRN: [id number] DOB: 11-26-1940 Today's Date: 10/18/2020    History of present illness 80 yo male sustained a fall coming to hosp for placement of HD catheter, sustained a fall on 09/07/20.  Did not get imaging on his hip until later, found R femoral neck fracture.  Now has R THA with direct anterior approach from 6/29, referred to PT.  PMHx:  ESRD on HD, anemia, CKD, DM, HLD, HTN, kidney stones   OT comments  Pt making good progress with functional goals. OT will continue to follow acutely to maximize level of function and safety  Follow Up Recommendations  SNF (CIR denied pt)    Equipment Recommendations  Other (comment) (TBD at next venue of care)    Recommendations for Other Services      Precautions / Restrictions Precautions Precautions: Fall Restrictions Weight Bearing Restrictions: Yes RLE Weight Bearing: Weight bearing as tolerated       Mobility Bed Mobility Overal bed mobility: Needs Assistance Bed Mobility: Sit to Supine       Sit to supine: Min guard   General bed mobility comments: Min guard for safety    Transfers Overall transfer level: Needs assistance Equipment used: Rolling walker (2 wheeled) Transfers: Sit to/from Stand Sit to Stand: Min guard        Lateral/Scoot Transfers: Min assist;Min guard      Balance Overall balance assessment: Needs assistance;History of Falls Sitting-balance support: No upper extremity supported;Feet supported Sitting balance-Leahy Scale: Good     Standing balance support: Bilateral upper extremity supported;During functional activity Standing balance-Leahy Scale: Poor                             ADL either performed or assessed with clinical judgement   ADL Overall ADL's : Needs assistance/impaired     Grooming: Wash/dry hands;Wash/dry face;Standing;Min guard       Lower Body Bathing: Moderate assistance;Sitting/lateral  leans Lower Body Bathing Details (indicate cue type and reason): simulated seated in recliner         Toilet Transfer: Minimal assistance;Min guard;Ambulation;RW   Toileting- Water quality scientist and Hygiene: Min guard;Sit to/from stand       Functional mobility during ADLs: Minimal assistance;Min guard;Rolling walker;Cueing for safety       Vision Patient Visual Report: No change from baseline     Perception     Praxis      Cognition Arousal/Alertness: Awake/alert Behavior During Therapy: WFL for tasks assessed/performed Overall Cognitive Status: Within Functional Limits for tasks assessed                                          Exercises     Shoulder Instructions       General Comments      Pertinent Vitals/ Pain       Pain Assessment: Faces Faces Pain Scale: Hurts a little bit Pain Location: R hip Pain Descriptors / Indicators: Discomfort;Sore Pain Intervention(s): Repositioned;Monitored during session  Home Living                                          Prior Functioning/Environment              Frequency  Min 2X/week        Progress Toward Goals  OT Goals(current goals can now be found in the care plan section)  Progress towards OT goals: Progressing toward goals     Plan Discharge plan remains appropriate    Co-evaluation                 AM-PAC OT "6 Clicks" Daily Activity     Outcome Measure   Help from another person eating meals?: None Help from another person taking care of personal grooming?: A Little Help from another person toileting, which includes using toliet, bedpan, or urinal?: A Little Help from another person bathing (including washing, rinsing, drying)?: A Lot Help from another person to put on and taking off regular upper body clothing?: None Help from another person to put on and taking off regular lower body clothing?: A Lot 6 Click Score: 18    End of Session  Equipment Utilized During Treatment: Gait belt;Rolling walker  OT Visit Diagnosis: Unsteadiness on feet (R26.81);Other abnormalities of gait and mobility (R26.89);History of falling (Z91.81);Muscle weakness (generalized) (M62.81);Pain Pain - Right/Left: Right Pain - part of body: Hip;Leg   Activity Tolerance Patient tolerated treatment well   Patient Left in bed;with call bell/phone within reach   Nurse Communication          Time: 1135-1200 OT Time Calculation (min): 25 min  Charges: OT General Charges $OT Visit: 1 Visit OT Treatments $Self Care/Home Management : 8-22 mins $Therapeutic Activity: 8-22 mins     Britt Bottom 10/18/2020, 1:47 PM

## 2020-10-19 DIAGNOSIS — B9689 Other specified bacterial agents as the cause of diseases classified elsewhere: Secondary | ICD-10-CM | POA: Diagnosis not present

## 2020-10-19 DIAGNOSIS — N186 End stage renal disease: Secondary | ICD-10-CM | POA: Diagnosis not present

## 2020-10-19 DIAGNOSIS — I120 Hypertensive chronic kidney disease with stage 5 chronic kidney disease or end stage renal disease: Secondary | ICD-10-CM | POA: Diagnosis not present

## 2020-10-19 DIAGNOSIS — D631 Anemia in chronic kidney disease: Secondary | ICD-10-CM | POA: Diagnosis not present

## 2020-10-19 DIAGNOSIS — R7881 Bacteremia: Secondary | ICD-10-CM | POA: Diagnosis not present

## 2020-10-19 DIAGNOSIS — I10 Essential (primary) hypertension: Secondary | ICD-10-CM | POA: Diagnosis not present

## 2020-10-19 DIAGNOSIS — R52 Pain, unspecified: Secondary | ICD-10-CM | POA: Diagnosis not present

## 2020-10-19 DIAGNOSIS — R030 Elevated blood-pressure reading, without diagnosis of hypertension: Secondary | ICD-10-CM | POA: Diagnosis present

## 2020-10-19 DIAGNOSIS — R000 Tachycardia, unspecified: Secondary | ICD-10-CM | POA: Diagnosis not present

## 2020-10-19 DIAGNOSIS — E785 Hyperlipidemia, unspecified: Secondary | ICD-10-CM | POA: Diagnosis not present

## 2020-10-19 DIAGNOSIS — E1122 Type 2 diabetes mellitus with diabetic chronic kidney disease: Secondary | ICD-10-CM | POA: Diagnosis not present

## 2020-10-19 DIAGNOSIS — E876 Hypokalemia: Secondary | ICD-10-CM | POA: Diagnosis not present

## 2020-10-19 DIAGNOSIS — Z794 Long term (current) use of insulin: Secondary | ICD-10-CM | POA: Diagnosis not present

## 2020-10-19 DIAGNOSIS — E669 Obesity, unspecified: Secondary | ICD-10-CM | POA: Diagnosis not present

## 2020-10-19 DIAGNOSIS — M6281 Muscle weakness (generalized): Secondary | ICD-10-CM | POA: Diagnosis not present

## 2020-10-19 DIAGNOSIS — N250 Renal osteodystrophy: Secondary | ICD-10-CM | POA: Diagnosis not present

## 2020-10-19 DIAGNOSIS — Z227 Latent tuberculosis: Secondary | ICD-10-CM | POA: Diagnosis not present

## 2020-10-19 DIAGNOSIS — Z96641 Presence of right artificial hip joint: Secondary | ICD-10-CM | POA: Diagnosis not present

## 2020-10-19 DIAGNOSIS — R2689 Other abnormalities of gait and mobility: Secondary | ICD-10-CM | POA: Diagnosis not present

## 2020-10-19 DIAGNOSIS — R6883 Chills (without fever): Secondary | ICD-10-CM | POA: Diagnosis not present

## 2020-10-19 DIAGNOSIS — U071 COVID-19: Secondary | ICD-10-CM | POA: Diagnosis not present

## 2020-10-19 DIAGNOSIS — S72001D Fracture of unspecified part of neck of right femur, subsequent encounter for closed fracture with routine healing: Secondary | ICD-10-CM | POA: Diagnosis not present

## 2020-10-19 DIAGNOSIS — R9431 Abnormal electrocardiogram [ECG] [EKG]: Secondary | ICD-10-CM | POA: Diagnosis not present

## 2020-10-19 DIAGNOSIS — I700 Atherosclerosis of aorta: Secondary | ICD-10-CM | POA: Diagnosis not present

## 2020-10-19 DIAGNOSIS — S72009A Fracture of unspecified part of neck of unspecified femur, initial encounter for closed fracture: Secondary | ICD-10-CM | POA: Diagnosis not present

## 2020-10-19 DIAGNOSIS — Z992 Dependence on renal dialysis: Secondary | ICD-10-CM | POA: Diagnosis not present

## 2020-10-19 DIAGNOSIS — E871 Hypo-osmolality and hyponatremia: Secondary | ICD-10-CM | POA: Diagnosis not present

## 2020-10-19 DIAGNOSIS — E119 Type 2 diabetes mellitus without complications: Secondary | ICD-10-CM | POA: Diagnosis not present

## 2020-10-19 LAB — GLUCOSE, CAPILLARY
Glucose-Capillary: 226 mg/dL — ABNORMAL HIGH (ref 70–99)
Glucose-Capillary: 168 mg/dL — ABNORMAL HIGH (ref 70–99)
Glucose-Capillary: 177 mg/dL — ABNORMAL HIGH (ref 70–99)

## 2020-10-19 NOTE — TOC Transition Note (Signed)
Transition of Care [facility]) - CM/SW Discharge Note   Patient Details  Name: Trevor [name] MRN: [id number] Date of Birth: 01-12-1941  [facility] of Care [facility]) CM/SW Contact:  Gabrielle Dare Phone Number: 10/19/2020, 11:05 AM   Clinical Narrative Patient will Discharge To: [facility] Anticipated DC Date:10/19/20 Family Notified:yes, Hildred Alamin, sister [phone] Transport HU:DJSH   Per MD patient ready for DC to  [facility]. RN, patient, patient's family, and facility notified of DC. Assessment, Fl2/Pasrr, and Discharge Summary sent to facility. RN given number for report [phone], Room # B24 Bed 2). DC packet on chart. Ambulance transport requested for patient for 12:00pm  CSW signing off.  [facility] [phone]     Final next level of care: Skilled Nursing Facility Barriers to Discharge: No Barriers Identified   Patient Goals and CMS Choice Patient states their goals for this hospitalization and ongoing recovery are:: Get better CMS Medicare.gov Compare Post Acute Care list provided to:: Patient Choice offered to / list presented to : Patient  Discharge Placement              Patient chooses bed at:  ([place], [place]) Patient to be transferred to facility by: [place] Name of family member notified: Mary Cobb Patient and family notified of of transfer: 10/19/20  Discharge Plan and Services                                     Social Determinants of Health (SDOH) Interventions     Readmission Risk Interventions No flowsheet data found.

## 2020-10-19 NOTE — Progress Notes (Signed)
[facility] NEPHROLOGY PROGRESS NOTE  Assessment/ Plan: Pt is a 80 y.o. yo male  with history of hypertension, DM, HLD, nephrolithiasis, ESRD on HD MWF at [facility] followed by Dr. Theador Hawthorne, transferred from [facility] for displaced femoral neck fracture, seen as a consultation for the management of ESRD.  #Right femoral neck fracture: Seen by orthopedics and underwent right total hip arthroplasty on 6/29.  PT OT and rehab per primary and orthopedics team.  Going to snf  # ESRD: MWF at [facility].  Right IJ TDC for access. Maintain mwf schedule here, next hd Monday 7/11   # Hypertension: On antihypertensive.  Volume management by ultrafiltration. If needed, can inc hydralazine to 100mg  tid   # Anemia of ESRD:   Iron saturation 10%--iv iron and esa ordered. Monitor hemoglobin, transfuse for hgb <7   # Metabolic Bone Disease: Calcium and phosphorus level at goal.  #Altered mental status-resolved: Seen by neurology.  MRI brain with chronic microvascular ischemic changes and chronic pontine infarct without evidence of acute finding.   He has been awake/alert  #Hyponatremia/hypokalemia: Managed by dialysis.   Subjective: Seen and examined at bedside.  Tolerated hd yesterday, net uf 3.5L. bp had been stable throughout HD. No complaints. Objective Vital signs in last 24 hours: Vitals:   10/18/20 1651 10/18/20 2138 10/19/20 0758 10/19/20 0801  BP: (!) 157/61 (!) 157/79 (!) 137/51 (!) 153/98  Pulse: 88 76 62 63  Resp: 17 15  17   Temp: 98.7 F (37.1 C) 98.7 F (37.1 C)  98.6 F (37 C)  TempSrc: Oral Oral  Oral  SpO2: 99% 99% 97% 100%  Weight:      Height:       Weight change:   Intake/Output Summary (Last 24 hours) at 10/19/2020 1055 Last data filed at 10/19/2020 0636 Gross per 24 hour  Intake --  Output 3600 ml  Net -3600 ml       Labs: Basic Metabolic Panel: Recent Labs  Lab 10/15/20 0321 10/16/20 0222 10/18/20 1210  NA 131* 133* 129*   K 4.1 4.3 4.4  CL 95* 98 94*  CO2 26 28 26   GLUCOSE 236* 182* 187*  BUN 40* 50* 48*  CREATININE 4.48* 5.87* 6.03*  CALCIUM 8.1* 8.2* 8.4*  PHOS 4.1 5.1* 4.3   Liver Function Tests: Recent Labs  Lab 10/14/20 1753 10/15/20 0321 10/16/20 0222 10/18/20 1210  AST 17 15 13*  --   ALT 9 8 8   --   ALKPHOS 37* 29* 31*  --   BILITOT 0.9 0.4 0.6  --   PROT 6.6 5.5* 5.9*  --   ALBUMIN 2.6* 2.3* 2.4* 2.7*   No results for input(s): LIPASE, AMYLASE in the last 168 hours. No results for input(s): AMMONIA in the last 168 hours.  CBC: Recent Labs  Lab 10/14/20 0133 10/14/20 1301 10/14/20 1753 10/15/20 0321 10/16/20 0222 10/18/20 1210  WBC 9.5 10.6* 11.8* 7.4 8.4 8.8  NEUTROABS 7.4  --   --  5.6 6.2  --   HGB 8.3* 8.6* 9.1* 7.8* 7.8* 8.9*  HCT 24.8* 26.6* 28.7* 24.4* 24.5* 27.7*  MCV 93.2 92.4 92.3 93.8 94.6 95.2  PLT 275 299 312 254 270 404*   Cardiac Enzymes: No results for input(s): CKTOTAL, CKMB, CKMBINDEX, TROPONINI in the last 168 hours. CBG: Recent Labs  Lab 10/18/20 1206 10/18/20 1656 10/18/20 2135 10/19/20 0315 10/19/20 0632  GLUCAP 219* 189* 215* 226* 168*    Iron Studies:  No results  for input(s): IRON, TIBC, TRANSFERRIN, FERRITIN in the last 72 hours.  Studies/Results: No results found.  Medications: Infusions:  ferric gluconate (FERRLECIT) IVPB Stopped (10/18/20 1555)    Scheduled Medications:  aspirin EC  325 mg Oral Q breakfast   carvedilol  6.25 mg Oral BID WC   chlorhexidine  60 mL Topical Once   Chlorhexidine Gluconate Cloth  6 each Topical Q0600   Chlorhexidine Gluconate Cloth  6 each Topical Q0600   cholecalciferol  1,000 Units Oral Daily   cloNIDine  0.2 mg Oral TID   darbepoetin (ARANESP) injection - DIALYSIS  60 mcg Intravenous Q Mon-HD   heparin injection (subcutaneous)  5,000 Units Subcutaneous Q8H   hydrALAZINE  50 mg Oral BID   insulin aspart  0-6 Units Subcutaneous TID WC   insulin detemir  8 Units Subcutaneous QHS   isoniazid   300 mg Oral Daily   multivitamin  1 tablet Oral QHS   povidone-iodine  2 application Topical Once   povidone-iodine  2 application Topical Once   pyridOXINE  50 mg Oral Daily   rosuvastatin  10 mg Oral Daily   senna-docusate  2 tablet Oral QHS    have reviewed scheduled and prn medications.  Physical Exam: General: nad Heart:RRR, s1s2 nl Lungs: cta b/l Abdomen:soft, Non-tender, non-distended Extremities:No LE edema Dialysis Access: Right IJ TDC.  Trevor [name] 10/19/2020,10:55 AM  LOS: 11 days

## 2020-10-19 NOTE — Progress Notes (Signed)
Pt discharged to [facility] in [place]. Left unit via ambulance transfer. Left in stable condition. Copy of AVS given to patient.  VWilliams,RN.

## 2020-10-19 NOTE — Plan of Care (Signed)
  Problem: Pain Managment: Goal: General experience of comfort will improve Outcome: Progressing   Problem: Education: Goal: Knowledge of General Education information will improve Description: Including pain rating scale, medication(s)/side effects and non-pharmacologic comfort measures Outcome: Progressing   Problem: Safety: Goal: Ability to remain free from injury will improve Outcome: Progressing   Problem: Skin Integrity: Goal: Risk for impaired skin integrity will decrease Outcome: Progressing

## 2020-10-21 DIAGNOSIS — B9689 Other specified bacterial agents as the cause of diseases classified elsewhere: Secondary | ICD-10-CM | POA: Diagnosis not present

## 2020-10-21 DIAGNOSIS — Z794 Long term (current) use of insulin: Secondary | ICD-10-CM | POA: Diagnosis not present

## 2020-10-21 DIAGNOSIS — Z992 Dependence on renal dialysis: Secondary | ICD-10-CM | POA: Diagnosis not present

## 2020-10-21 DIAGNOSIS — R52 Pain, unspecified: Secondary | ICD-10-CM | POA: Diagnosis not present

## 2020-10-21 DIAGNOSIS — Z227 Latent tuberculosis: Secondary | ICD-10-CM | POA: Diagnosis not present

## 2020-10-21 DIAGNOSIS — R7881 Bacteremia: Secondary | ICD-10-CM | POA: Diagnosis not present

## 2020-10-21 DIAGNOSIS — S72001D Fracture of unspecified part of neck of right femur, subsequent encounter for closed fracture with routine healing: Secondary | ICD-10-CM | POA: Diagnosis not present

## 2020-10-21 DIAGNOSIS — N186 End stage renal disease: Secondary | ICD-10-CM | POA: Diagnosis not present

## 2020-10-21 DIAGNOSIS — E785 Hyperlipidemia, unspecified: Secondary | ICD-10-CM | POA: Diagnosis not present

## 2020-10-21 DIAGNOSIS — I10 Essential (primary) hypertension: Secondary | ICD-10-CM | POA: Diagnosis not present

## 2020-10-21 DIAGNOSIS — R6883 Chills (without fever): Secondary | ICD-10-CM | POA: Diagnosis not present

## 2020-10-21 DIAGNOSIS — E119 Type 2 diabetes mellitus without complications: Secondary | ICD-10-CM | POA: Diagnosis not present

## 2020-10-23 ENCOUNTER — Encounter (HOSPITAL_COMMUNITY): Payer: Self-pay

## 2020-10-23 ENCOUNTER — Emergency Department (HOSPITAL_COMMUNITY): Payer: Medicare HMO

## 2020-10-23 ENCOUNTER — Emergency Department (HOSPITAL_COMMUNITY)
Admission: EM | Admit: 2020-10-23 | Discharge: 2020-10-24 | Disposition: A | Discharge status: Home / Self Care | Payer: Medicare HMO | Attending: Emergency Medicine | Admitting: Emergency Medicine

## 2020-10-23 ENCOUNTER — Other Ambulatory Visit: Payer: Self-pay

## 2020-10-23 DIAGNOSIS — D631 Anemia in chronic kidney disease: Secondary | ICD-10-CM | POA: Insufficient documentation

## 2020-10-23 DIAGNOSIS — R7881 Bacteremia: Secondary | ICD-10-CM | POA: Diagnosis not present

## 2020-10-23 DIAGNOSIS — N186 End stage renal disease: Secondary | ICD-10-CM | POA: Diagnosis not present

## 2020-10-23 DIAGNOSIS — R9431 Abnormal electrocardiogram [ECG] [EKG]: Secondary | ICD-10-CM | POA: Diagnosis not present

## 2020-10-23 DIAGNOSIS — I700 Atherosclerosis of aorta: Secondary | ICD-10-CM | POA: Diagnosis not present

## 2020-10-23 DIAGNOSIS — R000 Tachycardia, unspecified: Secondary | ICD-10-CM | POA: Diagnosis not present

## 2020-10-23 DIAGNOSIS — B9689 Other specified bacterial agents as the cause of diseases classified elsewhere: Secondary | ICD-10-CM | POA: Diagnosis not present

## 2020-10-23 DIAGNOSIS — Z992 Dependence on renal dialysis: Secondary | ICD-10-CM | POA: Insufficient documentation

## 2020-10-23 DIAGNOSIS — E1122 Type 2 diabetes mellitus with diabetic chronic kidney disease: Secondary | ICD-10-CM | POA: Insufficient documentation

## 2020-10-23 DIAGNOSIS — U071 COVID-19: Secondary | ICD-10-CM

## 2020-10-23 DIAGNOSIS — R030 Elevated blood-pressure reading, without diagnosis of hypertension: Secondary | ICD-10-CM | POA: Diagnosis present

## 2020-10-23 DIAGNOSIS — Z96641 Presence of right artificial hip joint: Secondary | ICD-10-CM | POA: Diagnosis not present

## 2020-10-23 DIAGNOSIS — R6883 Chills (without fever): Secondary | ICD-10-CM | POA: Diagnosis not present

## 2020-10-23 DIAGNOSIS — I120 Hypertensive chronic kidney disease with stage 5 chronic kidney disease or end stage renal disease: Secondary | ICD-10-CM | POA: Diagnosis not present

## 2020-10-23 LAB — COMPREHENSIVE METABOLIC PANEL
ALT: 13 U/L (ref 0–44)
AST: 20 U/L (ref 15–41)
Albumin: 3.5 g/dL (ref 3.5–5.0)
Alkaline Phosphatase: 44 U/L (ref 38–126)
Anion gap: 13 (ref 5–15)
BUN: 17 mg/dL (ref 8–23)
CO2: 24 mmol/L (ref 22–32)
Calcium: 8.2 mg/dL — ABNORMAL LOW (ref 8.9–10.3)
Chloride: 96 mmol/L — ABNORMAL LOW (ref 98–111)
Creatinine, Ser: 2.95 mg/dL — ABNORMAL HIGH (ref 0.61–1.24)
GFR, Estimated: 21 mL/min — ABNORMAL LOW (ref >60)
Glucose, Bld: 172 mg/dL — ABNORMAL HIGH (ref 70–99)
Potassium: 3.7 mmol/L (ref 3.5–5.1)
Sodium: 133 mmol/L — ABNORMAL LOW (ref 135–145)
Total Bilirubin: 0.7 mg/dL (ref 0.3–1.2)
Total Protein: 7.4 g/dL (ref 6.5–8.1)

## 2020-10-23 LAB — CBC WITH DIFFERENTIAL/PLATELET
Abs Immature Granulocytes: 0.09 10*3/uL — ABNORMAL HIGH (ref 0.00–0.07)
Basophils Absolute: 0.1 10*3/uL (ref 0.0–0.1)
Basophils Relative: 0 %
Eosinophils Absolute: 0.1 10*3/uL (ref 0.0–0.5)
Eosinophils Relative: 1 %
HCT: 28.7 % — ABNORMAL LOW (ref 39.0–52.0)
Hemoglobin: 9 g/dL — ABNORMAL LOW (ref 13.0–17.0)
Immature Granulocytes: 1 %
Lymphocytes Relative: 2 %
Lymphs Abs: 0.3 10*3/uL — ABNORMAL LOW (ref 0.7–4.0)
MCH: 30.6 pg (ref 26.0–34.0)
MCHC: 31.4 g/dL (ref 30.0–36.0)
MCV: 97.6 fL (ref 80.0–100.0)
Monocytes Absolute: 1.2 10*3/uL — ABNORMAL HIGH (ref 0.1–1.0)
Monocytes Relative: 8 %
Neutro Abs: 14.4 10*3/uL — ABNORMAL HIGH (ref 1.7–7.7)
Neutrophils Relative %: 88 %
Platelets: 387 10*3/uL (ref 150–400)
RBC: 2.94 MIL/uL — ABNORMAL LOW (ref 4.22–5.81)
RDW: 15.3 % (ref 11.5–15.5)
WBC: 16.2 10*3/uL — ABNORMAL HIGH (ref 4.0–10.5)
nRBC: 0 % (ref 0.0–0.2)

## 2020-10-23 LAB — RESP PANEL BY RT-PCR (FLU A&B, COVID) ARPGX2
Influenza A by PCR: NEGATIVE
Influenza B by PCR: NEGATIVE
SARS Coronavirus 2 by RT PCR: POSITIVE — AB

## 2020-10-23 LAB — PROTIME-INR
INR: 1.1 (ref 0.8–1.2)
Prothrombin Time: 14.5 seconds (ref 11.4–15.2)

## 2020-10-23 LAB — LACTIC ACID, PLASMA
Lactic Acid, Venous: 1.2 mmol/L (ref 0.5–1.9)
Lactic Acid, Venous: 0.9 mmol/L (ref 0.5–1.9)

## 2020-10-23 LAB — APTT: aPTT: 29 seconds (ref 24–36)

## 2020-10-23 LAB — CBG MONITORING, ED: Glucose-Capillary: 168 mg/dL — ABNORMAL HIGH (ref 70–99)

## 2020-10-23 IMAGING — DX DG CHEST 1V PORT
1 series · 1 of 1 positions shown · non-contrast
Comparison: [DATE]

CLINICAL DATA: Questionable sepsis

EXAM:
PORTABLE CHEST 1 VIEW

[chest ap]
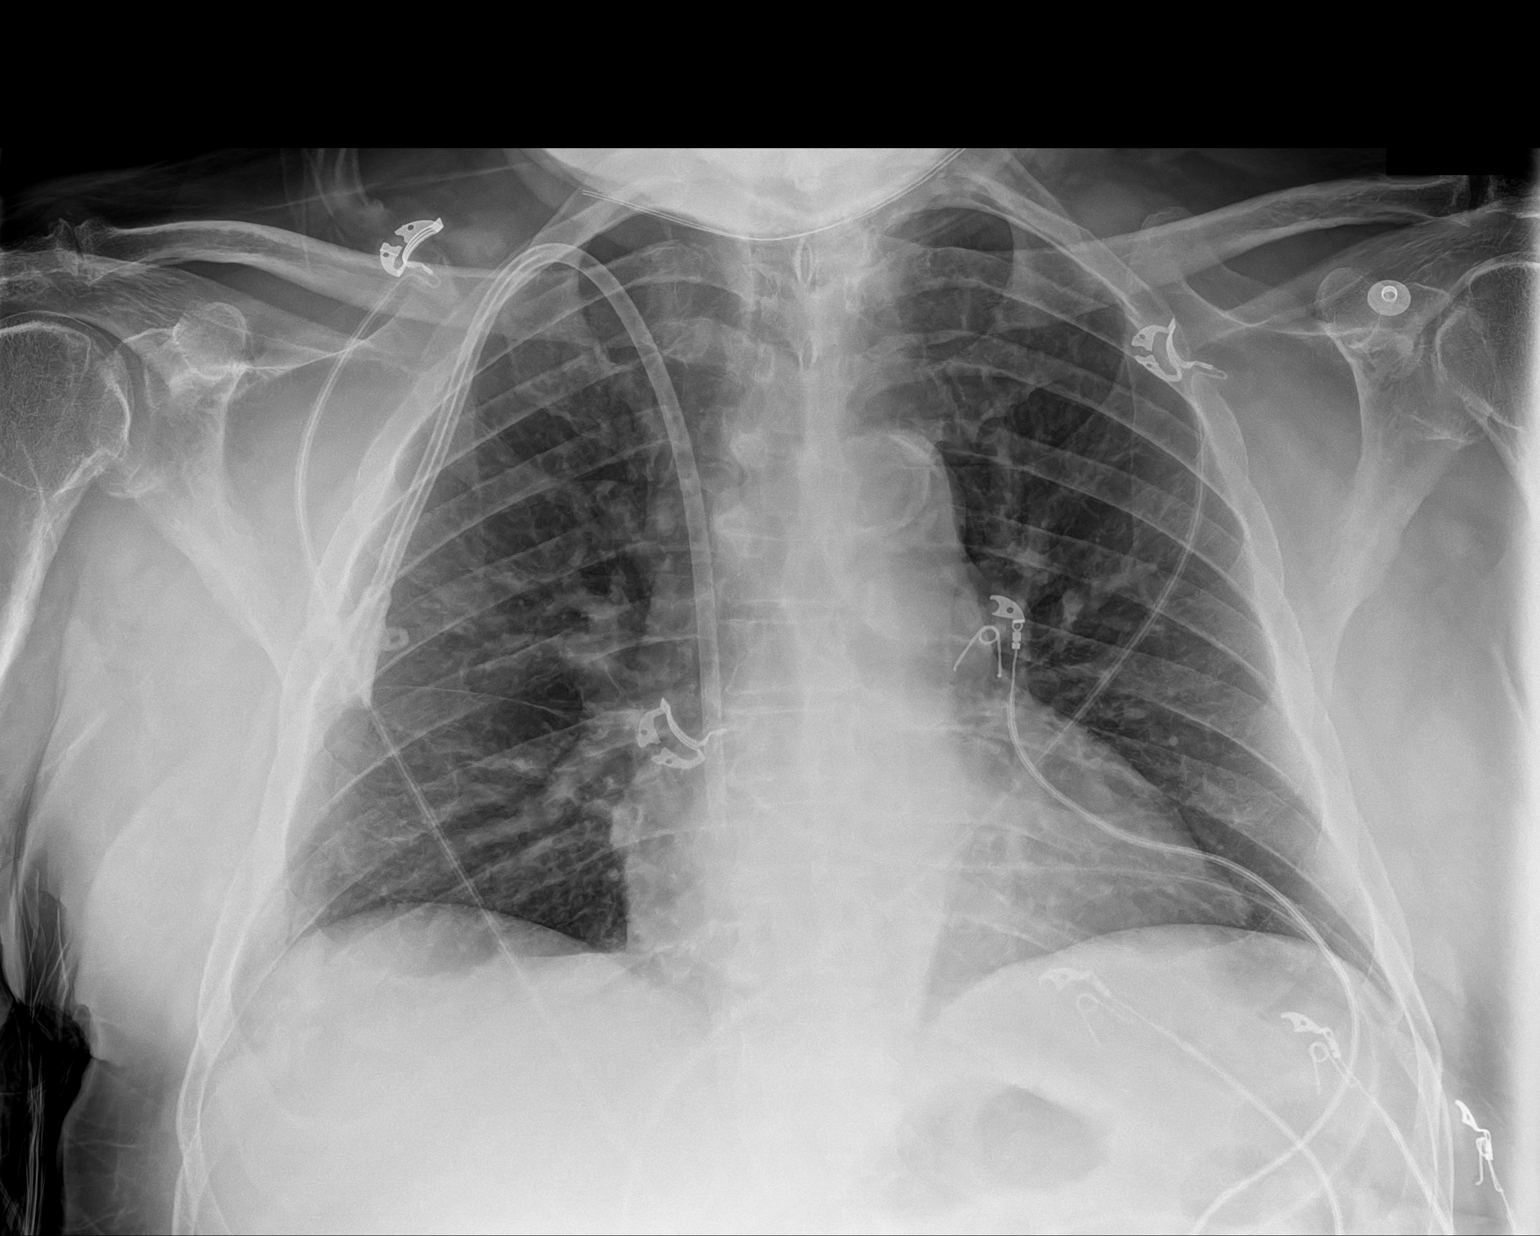

[1 of 1 positions shown; findings below may reference images not displayed]

FINDINGS: Cardiac shadow is stable. Aortic calcifications are noted. Right
jugular dialysis catheter is again seen and stable. Lungs are well
aerated and clear. No bony abnormality is seen.
IMPRESSION: No acute abnormality noted.

## 2020-10-23 MED ORDER — METRONIDAZOLE 500 MG/100ML IV SOLN: 500.0000 mg | Freq: Once | INTRAVENOUS | Status: DC

## 2020-10-23 MED ORDER — ACETAMINOPHEN 325 MG PO TABS
650.0000 mg | ORAL_TABLET | Freq: Once | ORAL | Status: AC
  Administered 2020-10-23: 650 mg via ORAL
  Filled 2020-10-23: qty 2

## 2020-10-23 MED ORDER — VANCOMYCIN HCL IN DEXTROSE 1-5 GM/200ML-% IV SOLN: 1000.0000 mg | INTRAVENOUS | Status: DC

## 2020-10-23 MED ORDER — SODIUM CHLORIDE 0.9 % IV SOLN
1.0000 g | INTRAVENOUS | Status: DC
  Filled 2020-10-23 (×2): qty 1

## 2020-10-23 MED ORDER — MOLNUPIRAVIR EUA 200MG CAPSULE: 4.0000 | ORAL_CAPSULE | Freq: Two times a day (BID) | ORAL | 0 refills | Status: AC

## 2020-10-23 NOTE — ED Notes (Signed)
Ultrasound was attempted without success. Dr. Laverta Baltimore notified.

## 2020-10-23 NOTE — ED Provider Notes (Signed)
[facility] EMERGENCY DEPARTMENT Provider Note   CSN: [id number] Arrival date & time: 10/23/20  1651     History Chief Complaint  Patient presents with   Hypertension    Trevor [name] is a 80 y.o. male with a history of end-stage renal disease on dialysis presenting today from dialysis for further evaluation of persistent high blood pressure despite undergoing a full dialysis treatment.  He has no complaints of any pain including chest pain, shortness of breath, headache or vision changes.  His only complaint is being hungry, stating they do not feed him enough at the rehab center.  He states he is currently in rehab secondary to a right total hip arthroplasty surgery secondary to fracture with surgical repair on June 29 in [place].  He denies any complaints regarding the hip surgery including pain or swelling at the surgery site.  He is noted to be febrile here with a temperature of 101.  He was not aware of feeling feverish and has no other complaints including cough, sore throat, headache, shortness of breath, also denies nausea, vomiting or abdominal pain.  He does make some urine and denies dysuria.   The history is provided by the patient.      Past Medical History:  Diagnosis Date   Anemia    Chronic kidney disease    Stage 4?    Diabetes mellitus without complication ([place])    Hepatitis    not sure what type - over 20 years ago   History of kidney stones    Hyperlipidemia    Hypertension     Patient Active Problem List   Diagnosis Date Noted   Hip fracture ([place]) 10/08/2020   End stage renal disease ([place]) 10/08/2020   Hyponatremia 10/08/2020   Hyperglycemia 10/08/2020   Hip pain 10/08/2020   History of renal dialysis 09/13/2020   Anemia 09/13/2020   Edema 09/13/2020   Essential hypertension 09/13/2020   Gastroesophageal reflux disease 09/13/2020   Hyperlipidemia 09/13/2020   Neuropathy 09/13/2020   Polyp of colon 09/13/2020   Proteinuria 09/13/2020    Latent tuberculosis 08/27/2020   Medication monitoring encounter 08/27/2020   Immunization counseling 08/27/2020   TB lung, latent 07/25/2020   Diabetes mellitus with stage 5 chronic kidney disease ([place]) 05/02/2020   Thyroid nodule 05/02/2020   Special screening for malignant neoplasms, colon     Past Surgical History:  Procedure Laterality Date   AV FISTULA PLACEMENT Left 08/09/2019   Procedure: LEFT ARM Basilic  ARTERIOVENOUS  FISTULA CREATION;  Surgeon: Waynetta Sandy, MD;  Location: [facility];  Service: Vascular;  Laterality: Left;   [place] Left 11/30/2019   Procedure: LEFT SECOND STAGE [place];  Surgeon: Waynetta Sandy, MD;  Location: [facility];  Service: Vascular;  Laterality: Left;   COLONOSCOPY N/A 07/07/2017   Procedure: COLONOSCOPY;  Surgeon: Danie Binder, MD;  [facility];  Service: Endoscopy;  Laterality: N/A;  8:30   IR FLUORO GUIDE CV LINE RIGHT  09/06/2020   IR US GUIDE VASC ACCESS RIGHT  09/06/2020   TOTAL HIP ARTHROPLASTY Right 10/09/2020   Procedure: TOTAL HIP ARTHROPLASTY ANTERIOR APPROACH;  Surgeon: Rod Can, MD;  Location: [facility];  Service: Orthopedics;  Laterality: Right;       Family History  Problem Relation Age of Onset   Diabetes Mother    Diabetes Brother    Heart attack Brother    Colon cancer Son    Lung cancer Son  Social History   Tobacco Use   Smoking status: Never   Smokeless tobacco: Never  Vaping Use   Vaping Use: Never used  Substance Use Topics   Alcohol use: Never   Drug use: Never    Home Medications Prior to Admission medications   Medication Sig Start Date End Date Taking? Authorizing Provider  Accu-Chek Softclix Lancets lancets  09/13/19   [provider]  acetaminophen (TYLENOL) 500 MG tablet Take 1,000 mg by mouth every 6 (six) hours as needed for moderate pain.    [provider]  Alcohol Swabs (B-D SINGLE USE SWABS REGULAR) PADS   08/11/19   [provider]  aspirin (ASPIRIN CHILDRENS) 81 MG chewable tablet Chew 1 tablet (81 mg total) by mouth 2 (two) times daily with a meal. 10/11/20 11/25/20  Cherlynn June B, PA  Blood Glucose Monitoring Suppl (ONETOUCH VERIO) w/Device KIT 1 each by Does not apply route as needed. 05/03/20   Cassandria Anger, MD  carvedilol (COREG) 3.125 MG tablet Take 6.25 mg by mouth 2 (two) times daily with a meal.  10/18/19 10/17/20  [provider]  Cholecalciferol 25 MCG (1000 UT) tablet Take 1,000 Units by mouth daily.  12/23/09   [provider]  cloNIDine (CATAPRES) 0.2 MG tablet Take 0.2 mg by mouth 3 (three) times daily.  04/12/17   [provider]  furosemide (LASIX) 40 MG tablet Take 40 mg by mouth 2 (two) times daily as needed. Patient not taking: Reported on 10/08/2020 07/14/19 11/20/19  [provider]  glucose blood (ONETOUCH VERIO) test strip Use as instructed 05/03/20   Cassandria Anger, MD  hydrALAZINE (APRESOLINE) 25 MG tablet Take 2 tablets by mouth 2 (two) times daily. 07/03/20   [provider]  HYDROcodone-acetaminophen (NORCO/VICODIN) 5-325 MG tablet Take 1-2 tablets by mouth every 6 (six) hours as needed for moderate pain. 10/11/20   Dorothyann Peng, PA  isoniazid (NYDRAZID) 300 MG tablet Take 1 tablet (300 mg total) by mouth daily. 07/25/20   Rosiland Oz, MD  pyridOXINE (VITAMIN B-6) 50 MG tablet Take 1 tablet (50 mg total) by mouth daily. 07/25/20   Rosiland Oz, MD  rosuvastatin (CRESTOR) 10 MG tablet Take 10 mg by mouth daily.  01/26/17   [provider]  sodium bicarbonate 650 MG tablet Take 650 mg by mouth 3 (three) times daily.  07/24/19   [provider]  TRESIBA FLEXTOUCH 200 UNIT/ML SOPN Inject 10 Units into the skin at bedtime. If sugar is 100 or over take 10 units 04/15/17   [provider]    Allergies    Patient has no known allergies.  Review of Systems   Review of Systems   Constitutional:  Positive for fever.  HENT:  Negative for congestion and sore throat.   Eyes: Negative.  Negative for visual disturbance.  Respiratory:  Negative for chest tightness and shortness of breath.   Cardiovascular:  Negative for chest pain.  Gastrointestinal:  Negative for abdominal pain, nausea and vomiting.  Genitourinary: Negative.   Musculoskeletal:  Negative for arthralgias, joint swelling and neck pain.  Skin: Negative.  Negative for rash and wound.  Neurological:  Negative for dizziness, weakness, light-headedness, numbness and headaches.  Psychiatric/Behavioral: Negative.    All other systems reviewed and are negative.  Physical Exam Updated Vital Signs BP (!) 160/58   Pulse (!) 106   Temp (!) 101 F (38.3 C) (Oral)   Resp (!) 30   Ht '5\' 11"'  (1.803  m)   Wt 96 kg   SpO2 97%   BMI 29.52 kg/m   Physical Exam Vitals and nursing note reviewed.  Constitutional:      Appearance: He is well-developed. He is not ill-appearing.  HENT:     Head: Normocephalic and atraumatic.  Eyes:     Conjunctiva/sclera: Conjunctivae normal.  Cardiovascular:     Rate and Rhythm: Regular rhythm. Tachycardia present.     Heart sounds: Normal heart sounds.  Pulmonary:     Effort: Pulmonary effort is normal.     Breath sounds: Normal breath sounds. No wheezing.  Abdominal:     General: Bowel sounds are normal.     Palpations: Abdomen is soft.     Tenderness: There is no abdominal tenderness.  Musculoskeletal:        General: Normal range of motion.     Cervical back: Normal range of motion.  Skin:    General: Skin is warm and dry.     Comments: Staples in place right hip, no erythema or edema or other evidence of surgical site infection.   Neurological:     Mental Status: He is alert.    ED Results / Procedures / Treatments   Labs (all labs ordered are listed, but only abnormal results are displayed) Labs Reviewed  CBC WITH DIFFERENTIAL/PLATELET - Abnormal; Notable for  the following components:      Result Value   WBC 16.2 (*)    RBC 2.94 (*)    Hemoglobin 9.0 (*)    HCT 28.7 (*)    Neutro Abs 14.4 (*)    Lymphs Abs 0.3 (*)    Monocytes Absolute 1.2 (*)    Abs Immature Granulocytes 0.09 (*)    All other components within normal limits  CBG MONITORING, ED - Abnormal; Notable for the following components:   Glucose-Capillary 168 (*)    All other components within normal limits  RESP PANEL BY RT-PCR (FLU A&B, COVID) ARPGX2  CULTURE, BLOOD (ROUTINE X 2)  CULTURE, BLOOD (ROUTINE X 2)  URINE CULTURE  LACTIC ACID, PLASMA  LACTIC ACID, PLASMA  COMPREHENSIVE METABOLIC PANEL  PROTIME-INR  APTT  URINALYSIS, ROUTINE W REFLEX MICROSCOPIC    EKG None  Radiology DG Chest Port 1 View  Result Date: 10/23/2020 CLINICAL DATA:  Questionable sepsis EXAM: PORTABLE CHEST 1 VIEW COMPARISON:  10/08/2020 FINDINGS: Cardiac shadow is stable. Aortic calcifications are noted. Right jugular dialysis catheter is again seen and stable. Lungs are well aerated and clear. No bony abnormality is seen. IMPRESSION: No acute abnormality noted. Electronically Signed   By: Inez Catalina M.D.   On: 10/23/2020 18:10    Procedures Procedures   Medications Ordered in ED Medications  metroNIDAZOLE (FLAGYL) IVPB 500 mg (has no administration in time range)  ceFEPIme (MAXIPIME) 1 g in sodium chloride 0.9 % 100 mL IVPB (has no administration in time range)  vancomycin (VANCOCIN) IVPB 1000 mg/200 mL premix (has no administration in time range)    ED Course  I have reviewed the triage vital signs and the nursing notes.  Pertinent labs & imaging results that were available during my care of the patient were reviewed by me and considered in my medical decision making (see chart for details).    MDM Rules/Calculators/A&P                          Febrile illness of unclear source, sepsis workup. No obvious surgical site infecition.  Pending  labs , undifferentiated sepsis abx ordered.     Discussed with Dr. Laverta Baltimore who assumes care of pt.  Final Clinical Impression(s) / ED Diagnoses Final diagnoses:  None    Rx / DC Orders ED Discharge Orders     None        Landis Martins 10/23/20 Duanne Limerick, MD 10/28/20 2213

## 2020-10-23 NOTE — ED Notes (Signed)
[facility] [employer] called to set up transportation at this time. 

## 2020-10-23 NOTE — ED Notes (Signed)
Notified Dr. Laverta Baltimore that pt. Is Covid positive. This nurse has been instructed to hold antibiotics.

## 2020-10-23 NOTE — ED Notes (Signed)
Attempted to call report x2 to facility.

## 2020-10-23 NOTE — Progress Notes (Signed)
Pharmacy Antibiotic Note  Trevor [name] is a 80 y.o. male admitted on 10/23/2020 with sepsis.  Pharmacy has been consulted for cefepime/vancomycin dosing. Patient presented to ED today from dialysis after a full session.   Plan: Vancomycin 2000mg  loading dose with 1000mg  IV after every dialysis session. Goal trough 15-25 mcg/ml Cefepime 1g IV q24h based on renal function (I.e. HD)  Height: 5\' 11"  (180.3 cm) Weight: 96 kg (211 lb 10.3 oz) IBW/kg (Calculated) : 75.3  Temp (24hrs), Avg:101 F (38.3 C), Min:101 F (38.3 C), Max:101 F (38.3 C)  Recent Labs  Lab 10/18/20 1210  WBC 8.8  CREATININE 6.03*    Estimated Creatinine Clearance: 11.7 mL/min (A) (by C-G formula based on SCr of 6.03 mg/dL (H)).    No Known Allergies  Antimicrobials this admission: Metronidazole 500mg  IV x1 7/14 >>   Dose adjustments this admission: N/a  Microbiology results: 7/14 BCx: pending 7/14 UCx: ordered   Thank you for allowing pharmacy to be a part of this patient's care.  Jordan Hawks Maalle Starrett 10/23/2020 5:49 PM

## 2020-10-23 NOTE — ED Notes (Signed)
Two nurses have attempted to place an IV. Another nurse is attempting.

## 2020-10-23 NOTE — ED Notes (Signed)
Attempted report x3 now to the facility.

## 2020-10-23 NOTE — Sepsis Progress Note (Signed)
Verified with primary RN that antibiotics were discontinued because patient tested positive for covid & he is being dishcarged.

## 2020-10-23 NOTE — ED Triage Notes (Signed)
BIB [facility] from dialysis after being hypertensive. HD nurse gave antihypertensive meds & got a full treatment. Pt has no complaints besides being hungry.

## 2020-10-23 NOTE — Discharge Instructions (Addendum)
You were seen in the emergency department today with fever.  Your COVID test has come back positive.  Your x-ray is not showing any pneumonia and you are not needing oxygen.  I am discharging you home with a prescription for an antiviral medication which she take for 5 days.  Please continue your dialysis sessions and remain in quarantine for at least the next 5 days.  If you develop chest pain, trouble breathing, confusion, or other severe symptoms please return to the emergency department.

## 2020-10-23 NOTE — Sepsis Progress Note (Signed)
Monitoring for code sepsis protocol. 

## 2020-10-25 DIAGNOSIS — N186 End stage renal disease: Secondary | ICD-10-CM | POA: Diagnosis not present

## 2020-10-25 DIAGNOSIS — B9689 Other specified bacterial agents as the cause of diseases classified elsewhere: Secondary | ICD-10-CM | POA: Diagnosis not present

## 2020-10-25 DIAGNOSIS — R7881 Bacteremia: Secondary | ICD-10-CM | POA: Diagnosis not present

## 2020-10-25 DIAGNOSIS — R6883 Chills (without fever): Secondary | ICD-10-CM | POA: Diagnosis not present

## 2020-10-25 DIAGNOSIS — Z992 Dependence on renal dialysis: Secondary | ICD-10-CM | POA: Diagnosis not present

## 2020-10-28 DIAGNOSIS — N186 End stage renal disease: Secondary | ICD-10-CM | POA: Diagnosis not present

## 2020-10-28 DIAGNOSIS — B9689 Other specified bacterial agents as the cause of diseases classified elsewhere: Secondary | ICD-10-CM | POA: Diagnosis not present

## 2020-10-28 DIAGNOSIS — Z992 Dependence on renal dialysis: Secondary | ICD-10-CM | POA: Diagnosis not present

## 2020-10-28 DIAGNOSIS — R7881 Bacteremia: Secondary | ICD-10-CM | POA: Diagnosis not present

## 2020-10-28 DIAGNOSIS — R6883 Chills (without fever): Secondary | ICD-10-CM | POA: Diagnosis not present

## 2020-10-28 LAB — CULTURE, BLOOD (ROUTINE X 2)
Culture: NO GROWTH
Culture: NO GROWTH
Special Requests: ADEQUATE

## 2020-10-30 DIAGNOSIS — Z992 Dependence on renal dialysis: Secondary | ICD-10-CM | POA: Diagnosis not present

## 2020-10-30 DIAGNOSIS — R6883 Chills (without fever): Secondary | ICD-10-CM | POA: Diagnosis not present

## 2020-10-30 DIAGNOSIS — N186 End stage renal disease: Secondary | ICD-10-CM | POA: Diagnosis not present

## 2020-10-30 DIAGNOSIS — B9689 Other specified bacterial agents as the cause of diseases classified elsewhere: Secondary | ICD-10-CM | POA: Diagnosis not present

## 2020-10-30 DIAGNOSIS — R7881 Bacteremia: Secondary | ICD-10-CM | POA: Diagnosis not present

## 2020-11-01 DIAGNOSIS — Z992 Dependence on renal dialysis: Secondary | ICD-10-CM | POA: Diagnosis not present

## 2020-11-01 DIAGNOSIS — R6883 Chills (without fever): Secondary | ICD-10-CM | POA: Diagnosis not present

## 2020-11-01 DIAGNOSIS — N186 End stage renal disease: Secondary | ICD-10-CM | POA: Diagnosis not present

## 2020-11-01 DIAGNOSIS — R7881 Bacteremia: Secondary | ICD-10-CM | POA: Diagnosis not present

## 2020-11-01 DIAGNOSIS — B9689 Other specified bacterial agents as the cause of diseases classified elsewhere: Secondary | ICD-10-CM | POA: Diagnosis not present

## 2020-11-04 DIAGNOSIS — B9689 Other specified bacterial agents as the cause of diseases classified elsewhere: Secondary | ICD-10-CM | POA: Diagnosis not present

## 2020-11-04 DIAGNOSIS — R6883 Chills (without fever): Secondary | ICD-10-CM | POA: Diagnosis not present

## 2020-11-04 DIAGNOSIS — N186 End stage renal disease: Secondary | ICD-10-CM | POA: Diagnosis not present

## 2020-11-04 DIAGNOSIS — R7881 Bacteremia: Secondary | ICD-10-CM | POA: Diagnosis not present

## 2020-11-04 DIAGNOSIS — Z992 Dependence on renal dialysis: Secondary | ICD-10-CM | POA: Diagnosis not present

## 2020-11-06 DIAGNOSIS — Z992 Dependence on renal dialysis: Secondary | ICD-10-CM | POA: Diagnosis not present

## 2020-11-06 DIAGNOSIS — N186 End stage renal disease: Secondary | ICD-10-CM | POA: Diagnosis not present

## 2020-11-06 DIAGNOSIS — B9689 Other specified bacterial agents as the cause of diseases classified elsewhere: Secondary | ICD-10-CM | POA: Diagnosis not present

## 2020-11-06 DIAGNOSIS — R6883 Chills (without fever): Secondary | ICD-10-CM | POA: Diagnosis not present

## 2020-11-06 DIAGNOSIS — R7881 Bacteremia: Secondary | ICD-10-CM | POA: Diagnosis not present

## 2020-11-08 DIAGNOSIS — R6883 Chills (without fever): Secondary | ICD-10-CM | POA: Diagnosis not present

## 2020-11-08 DIAGNOSIS — Z992 Dependence on renal dialysis: Secondary | ICD-10-CM | POA: Diagnosis not present

## 2020-11-08 DIAGNOSIS — N186 End stage renal disease: Secondary | ICD-10-CM | POA: Diagnosis not present

## 2020-11-08 DIAGNOSIS — S72009A Fracture of unspecified part of neck of unspecified femur, initial encounter for closed fracture: Secondary | ICD-10-CM | POA: Insufficient documentation

## 2020-11-08 DIAGNOSIS — R7881 Bacteremia: Secondary | ICD-10-CM | POA: Diagnosis not present

## 2020-11-08 DIAGNOSIS — S72031D Displaced midcervical fracture of right femur, subsequent encounter for closed fracture with routine healing: Secondary | ICD-10-CM | POA: Diagnosis not present

## 2020-11-08 DIAGNOSIS — Z4789 Encounter for other orthopedic aftercare: Secondary | ICD-10-CM | POA: Diagnosis not present

## 2020-11-08 DIAGNOSIS — B9689 Other specified bacterial agents as the cause of diseases classified elsewhere: Secondary | ICD-10-CM | POA: Diagnosis not present

## 2020-11-10 DIAGNOSIS — Z992 Dependence on renal dialysis: Secondary | ICD-10-CM | POA: Diagnosis not present

## 2020-11-10 DIAGNOSIS — N186 End stage renal disease: Secondary | ICD-10-CM | POA: Diagnosis not present

## 2020-11-11 DIAGNOSIS — R7881 Bacteremia: Secondary | ICD-10-CM | POA: Diagnosis not present

## 2020-11-11 DIAGNOSIS — N186 End stage renal disease: Secondary | ICD-10-CM | POA: Diagnosis not present

## 2020-11-11 DIAGNOSIS — Z992 Dependence on renal dialysis: Secondary | ICD-10-CM | POA: Diagnosis not present

## 2020-11-11 DIAGNOSIS — B9689 Other specified bacterial agents as the cause of diseases classified elsewhere: Secondary | ICD-10-CM | POA: Diagnosis not present

## 2020-11-13 DIAGNOSIS — N186 End stage renal disease: Secondary | ICD-10-CM | POA: Diagnosis not present

## 2020-11-13 DIAGNOSIS — B9689 Other specified bacterial agents as the cause of diseases classified elsewhere: Secondary | ICD-10-CM | POA: Diagnosis not present

## 2020-11-13 DIAGNOSIS — R7881 Bacteremia: Secondary | ICD-10-CM | POA: Diagnosis not present

## 2020-11-13 DIAGNOSIS — Z992 Dependence on renal dialysis: Secondary | ICD-10-CM | POA: Diagnosis not present

## 2020-11-15 ENCOUNTER — Emergency Department (HOSPITAL_COMMUNITY)
Admission: EM | Admit: 2020-11-15 | Discharge: 2020-11-15 | Disposition: A | Discharge status: Home / Self Care | Payer: Medicare HMO | Attending: Emergency Medicine | Admitting: Emergency Medicine

## 2020-11-15 ENCOUNTER — Other Ambulatory Visit: Payer: Self-pay

## 2020-11-15 DIAGNOSIS — Z96641 Presence of right artificial hip joint: Secondary | ICD-10-CM | POA: Diagnosis not present

## 2020-11-15 DIAGNOSIS — I440 Atrioventricular block, first degree: Secondary | ICD-10-CM | POA: Diagnosis not present

## 2020-11-15 DIAGNOSIS — I120 Hypertensive chronic kidney disease with stage 5 chronic kidney disease or end stage renal disease: Secondary | ICD-10-CM | POA: Insufficient documentation

## 2020-11-15 DIAGNOSIS — Z992 Dependence on renal dialysis: Secondary | ICD-10-CM | POA: Insufficient documentation

## 2020-11-15 DIAGNOSIS — B9689 Other specified bacterial agents as the cause of diseases classified elsewhere: Secondary | ICD-10-CM | POA: Diagnosis not present

## 2020-11-15 DIAGNOSIS — R7881 Bacteremia: Secondary | ICD-10-CM | POA: Diagnosis not present

## 2020-11-15 DIAGNOSIS — Z7982 Long term (current) use of aspirin: Secondary | ICD-10-CM | POA: Insufficient documentation

## 2020-11-15 DIAGNOSIS — D631 Anemia in chronic kidney disease: Secondary | ICD-10-CM | POA: Insufficient documentation

## 2020-11-15 DIAGNOSIS — I160 Hypertensive urgency: Secondary | ICD-10-CM | POA: Diagnosis not present

## 2020-11-15 DIAGNOSIS — R030 Elevated blood-pressure reading, without diagnosis of hypertension: Secondary | ICD-10-CM | POA: Diagnosis present

## 2020-11-15 DIAGNOSIS — N186 End stage renal disease: Secondary | ICD-10-CM | POA: Diagnosis not present

## 2020-11-15 DIAGNOSIS — Z79899 Other long term (current) drug therapy: Secondary | ICD-10-CM | POA: Diagnosis not present

## 2020-11-15 DIAGNOSIS — E1122 Type 2 diabetes mellitus with diabetic chronic kidney disease: Secondary | ICD-10-CM | POA: Diagnosis not present

## 2020-11-15 DIAGNOSIS — I10 Essential (primary) hypertension: Secondary | ICD-10-CM | POA: Diagnosis not present

## 2020-11-15 MED ORDER — CLONIDINE HCL 0.2 MG PO TABS
0.2000 mg | ORAL_TABLET | Freq: Once | ORAL | Status: AC
  Administered 2020-11-15: 0.2 mg via ORAL
  Filled 2020-11-15: qty 1

## 2020-11-15 MED ORDER — HYDRALAZINE HCL 25 MG PO TABS
100.0000 mg | ORAL_TABLET | Freq: Once | ORAL | Status: AC
  Administered 2020-11-15: 100 mg via ORAL
  Filled 2020-11-15: qty 4

## 2020-11-15 NOTE — ED Triage Notes (Signed)
Sent from dialysis BP elevated.  Pt wanting to go home and take his medicaiton.  Pt has no complains. BP per EMS 192/92

## 2020-11-15 NOTE — ED Provider Notes (Signed)
[facility] EMERGENCY DEPARTMENT Provider Note   CSN: [id number] Arrival date & time: 11/15/20  1745     History Chief Complaint  Patient presents with   Hypertension    Trevor [name] is a 80 y.o. male.  HPI 80 year old male presents with hypertension.  When he was leaving dialysis today it was 210/96.  He states it was normal throughout the dialysis.  Recently his blood pressure has been creeping up and it was 165/67 yesterday.  Sometime last week he reports it was 270/170.  He has been taking hydralazine 100 mg twice daily as instructed though he is only taking clonidine 0.2 mg twice per day as opposed to what he was instructed to do which is 3 times a day.  He denies any types of symptoms including no headache, vision changes, shortness of breath, chest pain.  No meds since this morning.   Past Medical History:  Diagnosis Date   Anemia    Chronic kidney disease    Stage 4?    Diabetes mellitus without complication ([place])    Hepatitis    not sure what type - over 20 years ago   History of kidney stones    Hyperlipidemia    Hypertension     Patient Active Problem List   Diagnosis Date Noted   Hip fracture ([place]) 10/08/2020   End stage renal disease ([place]) 10/08/2020   Hyponatremia 10/08/2020   Hyperglycemia 10/08/2020   Hip pain 10/08/2020   History of renal dialysis 09/13/2020   Anemia 09/13/2020   Edema 09/13/2020   Essential hypertension 09/13/2020   Gastroesophageal reflux disease 09/13/2020   Hyperlipidemia 09/13/2020   Neuropathy 09/13/2020   Polyp of colon 09/13/2020   Proteinuria 09/13/2020   Latent tuberculosis 08/27/2020   Medication monitoring encounter 08/27/2020   Immunization counseling 08/27/2020   TB lung, latent 07/25/2020   Diabetes mellitus with stage 5 chronic kidney disease ([place]) 05/02/2020   Thyroid nodule 05/02/2020   Special screening for malignant neoplasms, colon     Past Surgical History:  Procedure Laterality Date   AV FISTULA  PLACEMENT Left 08/09/2019   Procedure: LEFT ARM Basilic  ARTERIOVENOUS  FISTULA CREATION;  Surgeon: Waynetta Sandy, MD;  Location: [facility];  Service: Vascular;  Laterality: Left;   [place] Left 11/30/2019   Procedure: LEFT SECOND STAGE [place];  Surgeon: Waynetta Sandy, MD;  Location: [facility];  Service: Vascular;  Laterality: Left;   COLONOSCOPY N/A 07/07/2017   Procedure: COLONOSCOPY;  Surgeon: Danie Binder, MD;  [facility];  Service: Endoscopy;  Laterality: N/A;  8:30   IR FLUORO GUIDE CV LINE RIGHT  09/06/2020   IR US GUIDE VASC ACCESS RIGHT  09/06/2020   TOTAL HIP ARTHROPLASTY Right 10/09/2020   Procedure: TOTAL HIP ARTHROPLASTY ANTERIOR APPROACH;  Surgeon: Rod Can, MD;  Location: [facility];  Service: Orthopedics;  Laterality: Right;       Family History  Problem Relation Age of Onset   Diabetes Mother    Diabetes Brother    Heart attack Brother    Colon cancer Son    Lung cancer Son     Social History   Tobacco Use   Smoking status: Never   Smokeless tobacco: Never  Vaping Use   Vaping Use: Never used  Substance Use Topics   Alcohol use: Never   Drug use: Never    Home Medications Prior to Admission medications   Medication Sig Start Date End Date  Taking? Authorizing Provider  Accu-Chek Softclix Lancets lancets  09/13/19   [provider]  acetaminophen (TYLENOL) 500 MG tablet Take 1,000 mg by mouth every 6 (six) hours as needed for moderate pain.    [provider]  Alcohol Swabs (B-D SINGLE USE SWABS REGULAR) PADS  08/11/19   [provider]  aspirin (ASPIRIN CHILDRENS) 81 MG chewable tablet Chew 1 tablet (81 mg total) by mouth 2 (two) times daily with a meal. 10/11/20 11/25/20  Cherlynn June B, PA  Blood Glucose Monitoring Suppl (ONETOUCH VERIO) w/Device KIT 1 each by Does not apply route as needed. 05/03/20   Cassandria Anger, MD  calcitRIOL (ROCALTROL) 0.25 MCG  capsule Take 1 capsule by mouth daily. 09/06/20   [provider]  carvedilol (COREG) 3.125 MG tablet Take 6.25 mg by mouth 2 (two) times daily with a meal.  10/18/19   [provider]  Cholecalciferol 25 MCG (1000 UT) tablet Take 1,000 Units by mouth daily.  12/23/09   [provider]  cloNIDine (CATAPRES) 0.2 MG tablet Take 0.2 mg by mouth 3 (three) times daily.  04/12/17   [provider]  glucose blood (ONETOUCH VERIO) test strip Use as instructed 05/03/20   Cassandria Anger, MD  hydrALAZINE (APRESOLINE) 25 MG tablet Take 2 tablets by mouth 2 (two) times daily. 07/03/20   [provider]  HYDROcodone-acetaminophen (NORCO/VICODIN) 5-325 MG tablet Take 1-2 tablets by mouth every 6 (six) hours as needed for moderate pain. 10/11/20   Dorothyann Peng, PA  isoniazid (NYDRAZID) 300 MG tablet Take 1 tablet (300 mg total) by mouth daily. 07/25/20   Rosiland Oz, MD  pyridOXINE (VITAMIN B-6) 50 MG tablet Take 1 tablet (50 mg total) by mouth daily. 07/25/20   Rosiland Oz, MD  rosuvastatin (CRESTOR) 10 MG tablet Take 10 mg by mouth daily.  01/26/17   [provider]    Allergies    Patient has no known allergies.  Review of Systems   Review of Systems  Eyes:  Negative for visual disturbance.  Respiratory:  Negative for shortness of breath.   Cardiovascular:  Negative for chest pain.  Neurological:  Negative for dizziness and headaches.  All other systems reviewed and are negative.  Physical Exam Updated Vital Signs BP (!) 181/82   Pulse 70   Temp 98.4 F (36.9 C) (Oral)   Resp 20   Ht _0  (1.803 m)   Wt 108.9 kg   SpO2 100%   BMI 33.47 kg/m   Physical Exam Vitals and nursing note reviewed.  Constitutional:      Appearance: He is well-developed.  HENT:     Head: Normocephalic and atraumatic.     Right Ear: External ear normal.     Left Ear: External ear normal.     Nose: Nose normal.  Eyes:     General:         Right eye: No discharge.        Left eye: No discharge.  Cardiovascular:     Rate and Rhythm: Normal rate and regular rhythm.     Heart sounds: Normal heart sounds.  Pulmonary:     Effort: Pulmonary effort is normal.     Breath sounds: Normal breath sounds.  Abdominal:     Palpations: Abdomen is soft.     Tenderness: There is no abdominal tenderness.  Musculoskeletal:     Cervical back: Neck supple.  Skin:    General: Skin is warm and dry.  Neurological:     Mental Status: He is alert.     Comments: CN 3-12 grossly intact. 5/5 strength in all 4 extremities (though limited due to pain in RLE due to prior hip surgery). Grossly normal sensation. Normal finger to nose.   Psychiatric:        Mood and Affect: Mood is not anxious.    ED Results / Procedures / Treatments   Labs (all labs ordered are listed, but only abnormal results are displayed) Labs Reviewed - No data to display  EKG None  Radiology No results found.  Procedures Procedures   Medications Ordered in ED Medications  hydrALAZINE (APRESOLINE) tablet 100 mg (100 mg Oral Given 11/15/20 1934)  cloNIDine (CATAPRES) tablet 0.2 mg (0.2 mg Oral Given 11/15/20 1934)    ED Course  I have reviewed the triage vital signs and the nursing notes.  Pertinent labs & imaging results that were available during my care of the patient were reviewed by me and considered in my medical decision making (see chart for details).    MDM Rules/Calculators/A&P                           Patient presents with asymptomatic hypertensive urgency.  It seems like he is not quite taking his meds as he is supposed to.  I counseled on the importance of taking these, such as the clonidine 3 times daily as opposed to twice daily.  He was given clonidine and hydralazine with some improvement in blood pressure.  Given no signs of endorgan damage I think is reasonable to discharge home for outpatient management of his blood pressure.  We discussed return  precautions. Final Clinical Impression(s) / ED Diagnoses Final diagnoses:  Hypertensive urgency    Rx / DC Orders ED Discharge Orders     None        Sherwood Gambler, MD 11/15/20 2353

## 2020-11-15 NOTE — Discharge Instructions (Addendum)
Be sure to take your medicines as instructed including the clonidine 3 times a day instead of 2 times per day.  Call your doctor on Monday, 8/8.

## 2020-11-18 DIAGNOSIS — I10 Essential (primary) hypertension: Secondary | ICD-10-CM | POA: Diagnosis not present

## 2020-11-18 DIAGNOSIS — Z992 Dependence on renal dialysis: Secondary | ICD-10-CM | POA: Diagnosis not present

## 2020-11-18 DIAGNOSIS — N186 End stage renal disease: Secondary | ICD-10-CM | POA: Diagnosis not present

## 2020-11-18 DIAGNOSIS — R7881 Bacteremia: Secondary | ICD-10-CM | POA: Diagnosis not present

## 2020-11-18 DIAGNOSIS — B9689 Other specified bacterial agents as the cause of diseases classified elsewhere: Secondary | ICD-10-CM | POA: Diagnosis not present

## 2020-11-20 DIAGNOSIS — N186 End stage renal disease: Secondary | ICD-10-CM | POA: Diagnosis not present

## 2020-11-20 DIAGNOSIS — R7881 Bacteremia: Secondary | ICD-10-CM | POA: Diagnosis not present

## 2020-11-20 DIAGNOSIS — B9689 Other specified bacterial agents as the cause of diseases classified elsewhere: Secondary | ICD-10-CM | POA: Diagnosis not present

## 2020-11-20 DIAGNOSIS — Z992 Dependence on renal dialysis: Secondary | ICD-10-CM | POA: Diagnosis not present

## 2020-11-21 ENCOUNTER — Other Ambulatory Visit: Payer: Self-pay

## 2020-11-21 ENCOUNTER — Telehealth: Payer: Self-pay

## 2020-11-21 DIAGNOSIS — N185 Chronic kidney disease, stage 5: Secondary | ICD-10-CM

## 2020-11-21 NOTE — Telephone Encounter (Signed)
[facility] HD PA calls today to report that patient's second stage AVF does not have a thrill or bruit. Unclear how long this has been going on - but "fistula hasn't worked since May." HD was initiated in May 2022, and they have been utilizing a TDC the entire time. Placed patient on the schedule for a dialysis access duplex and follow up visit.

## 2020-11-22 DIAGNOSIS — Z992 Dependence on renal dialysis: Secondary | ICD-10-CM | POA: Diagnosis not present

## 2020-11-22 DIAGNOSIS — B9689 Other specified bacterial agents as the cause of diseases classified elsewhere: Secondary | ICD-10-CM | POA: Diagnosis not present

## 2020-11-22 DIAGNOSIS — R7881 Bacteremia: Secondary | ICD-10-CM | POA: Diagnosis not present

## 2020-11-22 DIAGNOSIS — N186 End stage renal disease: Secondary | ICD-10-CM | POA: Diagnosis not present

## 2020-11-25 DIAGNOSIS — B9689 Other specified bacterial agents as the cause of diseases classified elsewhere: Secondary | ICD-10-CM | POA: Diagnosis not present

## 2020-11-25 DIAGNOSIS — N186 End stage renal disease: Secondary | ICD-10-CM | POA: Diagnosis not present

## 2020-11-25 DIAGNOSIS — R7881 Bacteremia: Secondary | ICD-10-CM | POA: Diagnosis not present

## 2020-11-25 DIAGNOSIS — Z992 Dependence on renal dialysis: Secondary | ICD-10-CM | POA: Diagnosis not present

## 2020-11-26 DIAGNOSIS — I10 Essential (primary) hypertension: Secondary | ICD-10-CM | POA: Diagnosis not present

## 2020-11-27 DIAGNOSIS — B9689 Other specified bacterial agents as the cause of diseases classified elsewhere: Secondary | ICD-10-CM | POA: Diagnosis not present

## 2020-11-27 DIAGNOSIS — R7881 Bacteremia: Secondary | ICD-10-CM | POA: Diagnosis not present

## 2020-11-27 DIAGNOSIS — N186 End stage renal disease: Secondary | ICD-10-CM | POA: Diagnosis not present

## 2020-11-27 DIAGNOSIS — Z992 Dependence on renal dialysis: Secondary | ICD-10-CM | POA: Diagnosis not present

## 2020-11-29 DIAGNOSIS — N186 End stage renal disease: Secondary | ICD-10-CM | POA: Diagnosis not present

## 2020-11-29 DIAGNOSIS — R7881 Bacteremia: Secondary | ICD-10-CM | POA: Diagnosis not present

## 2020-11-29 DIAGNOSIS — Z992 Dependence on renal dialysis: Secondary | ICD-10-CM | POA: Diagnosis not present

## 2020-11-29 DIAGNOSIS — B9689 Other specified bacterial agents as the cause of diseases classified elsewhere: Secondary | ICD-10-CM | POA: Diagnosis not present

## 2020-12-02 DIAGNOSIS — Z992 Dependence on renal dialysis: Secondary | ICD-10-CM | POA: Diagnosis not present

## 2020-12-02 DIAGNOSIS — N186 End stage renal disease: Secondary | ICD-10-CM | POA: Diagnosis not present

## 2020-12-02 DIAGNOSIS — B9689 Other specified bacterial agents as the cause of diseases classified elsewhere: Secondary | ICD-10-CM | POA: Diagnosis not present

## 2020-12-02 DIAGNOSIS — R7881 Bacteremia: Secondary | ICD-10-CM | POA: Diagnosis not present

## 2020-12-04 ENCOUNTER — Other Ambulatory Visit: Payer: Self-pay

## 2020-12-04 DIAGNOSIS — B9689 Other specified bacterial agents as the cause of diseases classified elsewhere: Secondary | ICD-10-CM | POA: Diagnosis not present

## 2020-12-04 DIAGNOSIS — Z992 Dependence on renal dialysis: Secondary | ICD-10-CM | POA: Diagnosis not present

## 2020-12-04 DIAGNOSIS — R7881 Bacteremia: Secondary | ICD-10-CM | POA: Diagnosis not present

## 2020-12-04 DIAGNOSIS — N186 End stage renal disease: Secondary | ICD-10-CM | POA: Diagnosis not present

## 2020-12-04 DIAGNOSIS — N185 Chronic kidney disease, stage 5: Secondary | ICD-10-CM

## 2020-12-04 NOTE — Telephone Encounter (Signed)
Per guidelines, changed HD duplex to new UE vein mapping

## 2020-12-06 ENCOUNTER — Ambulatory Visit (INDEPENDENT_AMBULATORY_CARE_PROVIDER_SITE_OTHER): Payer: Medicare HMO | Admitting: Vascular Surgery

## 2020-12-06 ENCOUNTER — Ambulatory Visit (HOSPITAL_COMMUNITY)
Admission: RE | Admit: 2020-12-06 | Discharge: 2020-12-06 | Disposition: A | Discharge status: Home / Self Care | Payer: Medicare HMO | Source: Ambulatory Visit | Attending: Vascular Surgery | Admitting: Vascular Surgery

## 2020-12-06 ENCOUNTER — Encounter: Payer: Self-pay | Admitting: Vascular Surgery

## 2020-12-06 ENCOUNTER — Other Ambulatory Visit: Payer: Self-pay

## 2020-12-06 ENCOUNTER — Ambulatory Visit (INDEPENDENT_AMBULATORY_CARE_PROVIDER_SITE_OTHER)
Admission: RE | Admit: 2020-12-06 | Discharge: 2020-12-06 | Disposition: A | Discharge status: Home / Self Care | Payer: Medicare HMO | Source: Ambulatory Visit | Attending: Vascular Surgery | Admitting: Vascular Surgery

## 2020-12-06 VITALS — BP 185/91 | HR 97 | Temp 97.2°F | Resp 18 | Ht 71.0 in | Wt 195.0 lb

## 2020-12-06 DIAGNOSIS — N186 End stage renal disease: Secondary | ICD-10-CM

## 2020-12-06 DIAGNOSIS — N185 Chronic kidney disease, stage 5: Secondary | ICD-10-CM | POA: Insufficient documentation

## 2020-12-06 DIAGNOSIS — R7881 Bacteremia: Secondary | ICD-10-CM | POA: Diagnosis not present

## 2020-12-06 DIAGNOSIS — B9689 Other specified bacterial agents as the cause of diseases classified elsewhere: Secondary | ICD-10-CM | POA: Diagnosis not present

## 2020-12-06 DIAGNOSIS — Z992 Dependence on renal dialysis: Secondary | ICD-10-CM | POA: Diagnosis not present

## 2020-12-06 NOTE — H&P (View-Only) (Signed)
Patient ID: Trevor [name], male   DOB: 1940-04-18, 80 y.o.   MRN: [id number]  Reason for Consult: Follow-up (No thrill or bruit)   Referred by Celene Squibb, MD  Subjective:     HPI:  Trevor [name] is a 80 y.o. male history of end-stage renal disease currently on dialysis Mondays, Wednesdays and Fridays via right IJ tunneled dialysis catheter.  We will place a two-stage left arm basilic vein fistula which unfortunately is thrombosed.  Chief complaint today is actually right lower extremity pain status post right hip replacement.  He is not having any issues with his catheter.  Patient is right-hand dominant would prefer to have access remain in his left arm.  He does not take any blood thinners.  He has no problems with his hand after previous dialysis access surgery on the left  Past Medical History:  Diagnosis Date   Anemia    Chronic kidney disease    Stage 4?    Diabetes mellitus without complication (HCC)    Hepatitis    not sure what type - over 20 years ago   History of kidney stones    Hyperlipidemia    Hypertension    Family History  Problem Relation Age of Onset   Diabetes Mother    Diabetes Brother    Heart attack Brother    Colon cancer Son    Lung cancer Son    Past Surgical History:  Procedure Laterality Date   AV FISTULA PLACEMENT Left 08/09/2019   Procedure: LEFT ARM Basilic  ARTERIOVENOUS  FISTULA CREATION;  Surgeon: Waynetta Sandy, MD;  Location: [place];  Service: Vascular;  Laterality: Left;   [place] Left 11/30/2019   Procedure: LEFT SECOND STAGE [place];  Surgeon: Waynetta Sandy, MD;  Location: [place];  Service: Vascular;  Laterality: Left;   COLONOSCOPY N/A 07/07/2017   Procedure: COLONOSCOPY;  Surgeon: Danie Binder, MD;  [facility];  Service: Endoscopy;  Laterality: N/A;  8:30   IR FLUORO GUIDE CV LINE RIGHT  09/06/2020   IR US GUIDE VASC ACCESS RIGHT  09/06/2020   TOTAL HIP  ARTHROPLASTY Right 10/09/2020   Procedure: TOTAL HIP ARTHROPLASTY ANTERIOR APPROACH;  Surgeon: Rod Can, MD;  Location: [place];  Service: Orthopedics;  Laterality: Right;    Short Social History:  Social History   Tobacco Use   Smoking status: Never   Smokeless tobacco: Never  Substance Use Topics   Alcohol use: Never    No Known Allergies  Current Outpatient Medications  Medication Sig Dispense Refill   Accu-Chek Softclix Lancets lancets      acetaminophen (TYLENOL) 500 MG tablet Take 1,000 mg by mouth every 6 (six) hours as needed for moderate pain.     Alcohol Swabs (B-D SINGLE USE SWABS REGULAR) PADS      Blood Glucose Monitoring Suppl (ONETOUCH VERIO) w/Device KIT 1 each by Does not apply route as needed. 1 kit 0   calcitRIOL (ROCALTROL) 0.25 MCG capsule Take 1 capsule by mouth daily.     carvedilol (COREG) 3.125 MG tablet Take 6.25 mg by mouth 2 (two) times daily with a meal.      Cholecalciferol 25 MCG (1000 UT) tablet Take 1,000 Units by mouth daily.      cloNIDine (CATAPRES) 0.2 MG tablet Take 0.2 mg by mouth 3 (three) times daily.      glucose blood (ONETOUCH VERIO) test strip Use as instructed 300 each 2  hydrALAZINE (APRESOLINE) 25 MG tablet Take 2 tablets by mouth 2 (two) times daily.     HYDROcodone-acetaminophen (NORCO/VICODIN) 5-325 MG tablet Take 1-2 tablets by mouth every 6 (six) hours as needed for moderate pain. 30 tablet 0   isoniazid (NYDRAZID) 300 MG tablet Take 1 tablet (300 mg total) by mouth daily. 30 tablet 3   pyridOXINE (VITAMIN B-6) 50 MG tablet Take 1 tablet (50 mg total) by mouth daily. 30 tablet 3   rosuvastatin (CRESTOR) 10 MG tablet Take 10 mg by mouth daily.      No current facility-administered medications for this visit.    Review of Systems  Constitutional:  Constitutional negative. HENT: HENT negative.  Eyes: Eyes negative.  Respiratory: Respiratory negative.  Cardiovascular: Cardiovascular negative.  GI: Gastrointestinal  negative.  Musculoskeletal: Positive for leg pain and joint pain.  Neurological: Neurological negative. Hematologic: Hematologic/lymphatic negative.  Psychiatric: Psychiatric negative.       Objective:  Objective   Vitals:   12/06/20 0845  BP: (!) 185/91  Pulse: 97  Resp: 18  Temp: (!) 97.2 F (36.2 C)  TempSrc: Temporal  SpO2: 98%  Weight: 195 lb (88.5 kg)  Height: _0  (1.803 m)   Body mass index is 27.2 kg/m.  Physical Exam HENT:     Head: Normocephalic.     Nose:     Comments: Wearing a mask Eyes:     Pupils: Pupils are equal, round, and reactive to light.  Cardiovascular:     Rate and Rhythm: Normal rate.  Pulmonary:     Effort: Pulmonary effort is normal.  Abdominal:     General: Abdomen is flat.     Palpations: Abdomen is soft.  Musculoskeletal:        General: Normal range of motion.  Skin:    Capillary Refill: Capillary refill takes less than 2 seconds.  Neurological:     General: No focal deficit present.     Mental Status: He is alert.  Psychiatric:        Mood and Affect: Mood normal.        Behavior: Behavior normal.        Thought Content: Thought content normal.        Judgment: Judgment normal.    Data: +-----------------+-------------+----------+--------+  Right Cephalic   Diameter (cm)Depth (cm)Findings  +-----------------+-------------+----------+--------+  Shoulder             0.14                         +-----------------+-------------+----------+--------+  Mid upper arm        0.17                         +-----------------+-------------+----------+--------+  Dist upper arm       0.17                         +-----------------+-------------+----------+--------+  Antecubital fossa    0.31                         +-----------------+-------------+----------+--------+  Prox forearm         0.19                         +-----------------+-------------+----------+--------+  Mid forearm          0.25                          +-----------------+-------------+----------+--------+  Wrist                0.23                         +-----------------+-------------+----------+--------+   +-----------------+-------------+----------+--------+  Right Basilic    Diameter (cm)Depth (cm)Findings  +-----------------+-------------+----------+--------+  Mid upper arm        0.23                         +-----------------+-------------+----------+--------+  Dist upper arm       0.27                         +-----------------+-------------+----------+--------+  Antecubital fossa    0.34                         +-----------------+-------------+----------+--------+   +-----------------+-------------+----------+--------+  Left Cephalic    Diameter (cm)Depth (cm)Findings  +-----------------+-------------+----------+--------+  Shoulder             0.17                         +-----------------+-------------+----------+--------+  Mid upper arm        0.20                         +-----------------+-------------+----------+--------+  Dist upper arm       0.18                         +-----------------+-------------+----------+--------+  Antecubital fossa    0.21                         +-----------------+-------------+----------+--------+  Prox forearm         0.27                         +-----------------+-------------+----------+--------+  Mid forearm          0.23                         +-----------------+-------------+----------+--------+  Wrist                0.24                         +-----------------+-------------+----------+--------+    Right Pre-Dialysis Findings:  +-----------------------+----------+--------------------+---------+--------  +  Location               PSV (cm/s)Intralum. Diam. (cm)Waveform  Comments  +-----------------------+----------+--------------------+---------+--------  +  Brachial  Antecub. [id number]       0.45                triphasic            +-----------------------+----------+--------------------+---------+--------  +  Radial Art at Wrist    123       0.29                triphasic            +-----------------------+----------+--------------------+---------+--------  +  Ulnar Art at Wrist     107       0.27                triphasic            +-----------------------+----------+--------------------+---------+--------  +  Left Pre-Dialysis Findings:  +-----------------------+----------+--------------------+---------+--------  +  Location               PSV (cm/s)Intralum. Diam. (cm)Waveform  Comments  +-----------------------+----------+--------------------+---------+--------  +  Brachial Antecub. fossa140       0.58                triphasic            +-----------------------+----------+--------------------+---------+--------  +  Radial Art at Wrist    159       0.29                triphasic            +-----------------------+----------+--------------------+---------+--------  +  Ulnar Art at Wrist     86        0.25                triphasic            +-----------------------+----------+--------------------+---------+--------       Assessment/Plan:     80 year old male with history of left arm two-stage basilic vein AV fistula which has now thrombosed.  He does not appear to have suitable vein in either arm for access creation.  He desires to keep access in the left upper extremity.  Plan is left upper arm AV graft on a nondialysis day in the near future.  I discussed the risk benefits and alternatives that he demonstrates good understanding.     Waynetta Sandy MD [facility]

## 2020-12-06 NOTE — Progress Notes (Signed)
Patient ID: Trevor [name], male   DOB: 1940-04-18, 80 y.o.   MRN: [id number]  Reason for Consult: Follow-up (No thrill or bruit)   Referred by Celene Squibb, MD  Subjective:     HPI:  Trevor [name] is a 80 y.o. male history of end-stage renal disease currently on dialysis Mondays, Wednesdays and Fridays via right IJ tunneled dialysis catheter.  We will place a two-stage left arm basilic vein fistula which unfortunately is thrombosed.  Chief complaint today is actually right lower extremity pain status post right hip replacement.  He is not having any issues with his catheter.  Patient is right-hand dominant would prefer to have access remain in his left arm.  He does not take any blood thinners.  He has no problems with his hand after previous dialysis access surgery on the left  Past Medical History:  Diagnosis Date   Anemia    Chronic kidney disease    Stage 4?    Diabetes mellitus without complication (HCC)    Hepatitis    not sure what type - over 20 years ago   History of kidney stones    Hyperlipidemia    Hypertension    Family History  Problem Relation Age of Onset   Diabetes Mother    Diabetes Brother    Heart attack Brother    Colon cancer Son    Lung cancer Son    Past Surgical History:  Procedure Laterality Date   AV FISTULA PLACEMENT Left 08/09/2019   Procedure: LEFT ARM Basilic  ARTERIOVENOUS  FISTULA CREATION;  Surgeon: Waynetta Sandy, MD;  Location: [place];  Service: Vascular;  Laterality: Left;   [place] Left 11/30/2019   Procedure: LEFT SECOND STAGE [place];  Surgeon: Waynetta Sandy, MD;  Location: [place];  Service: Vascular;  Laterality: Left;   COLONOSCOPY N/A 07/07/2017   Procedure: COLONOSCOPY;  Surgeon: Danie Binder, MD;  [facility];  Service: Endoscopy;  Laterality: N/A;  8:30   IR FLUORO GUIDE CV LINE RIGHT  09/06/2020   IR US GUIDE VASC ACCESS RIGHT  09/06/2020   TOTAL HIP  ARTHROPLASTY Right 10/09/2020   Procedure: TOTAL HIP ARTHROPLASTY ANTERIOR APPROACH;  Surgeon: Rod Can, MD;  Location: [place];  Service: Orthopedics;  Laterality: Right;    Short Social History:  Social History   Tobacco Use   Smoking status: Never   Smokeless tobacco: Never  Substance Use Topics   Alcohol use: Never    No Known Allergies  Current Outpatient Medications  Medication Sig Dispense Refill   Accu-Chek Softclix Lancets lancets      acetaminophen (TYLENOL) 500 MG tablet Take 1,000 mg by mouth every 6 (six) hours as needed for moderate pain.     Alcohol Swabs (B-D SINGLE USE SWABS REGULAR) PADS      Blood Glucose Monitoring Suppl (ONETOUCH VERIO) w/Device KIT 1 each by Does not apply route as needed. 1 kit 0   calcitRIOL (ROCALTROL) 0.25 MCG capsule Take 1 capsule by mouth daily.     carvedilol (COREG) 3.125 MG tablet Take 6.25 mg by mouth 2 (two) times daily with a meal.      Cholecalciferol 25 MCG (1000 UT) tablet Take 1,000 Units by mouth daily.      cloNIDine (CATAPRES) 0.2 MG tablet Take 0.2 mg by mouth 3 (three) times daily.      glucose blood (ONETOUCH VERIO) test strip Use as instructed 300 each 2  hydrALAZINE (APRESOLINE) 25 MG tablet Take 2 tablets by mouth 2 (two) times daily.     HYDROcodone-acetaminophen (NORCO/VICODIN) 5-325 MG tablet Take 1-2 tablets by mouth every 6 (six) hours as needed for moderate pain. 30 tablet 0   isoniazid (NYDRAZID) 300 MG tablet Take 1 tablet (300 mg total) by mouth daily. 30 tablet 3   pyridOXINE (VITAMIN B-6) 50 MG tablet Take 1 tablet (50 mg total) by mouth daily. 30 tablet 3   rosuvastatin (CRESTOR) 10 MG tablet Take 10 mg by mouth daily.      No current facility-administered medications for this visit.    Review of Systems  Constitutional:  Constitutional negative. HENT: HENT negative.  Eyes: Eyes negative.  Respiratory: Respiratory negative.  Cardiovascular: Cardiovascular negative.  GI: Gastrointestinal  negative.  Musculoskeletal: Positive for leg pain and joint pain.  Neurological: Neurological negative. Hematologic: Hematologic/lymphatic negative.  Psychiatric: Psychiatric negative.       Objective:  Objective   Vitals:   12/06/20 0845  BP: (!) 185/91  Pulse: 97  Resp: 18  Temp: (!) 97.2 F (36.2 C)  TempSrc: Temporal  SpO2: 98%  Weight: 195 lb (88.5 kg)  Height: _0  (1.803 m)   Body mass index is 27.2 kg/m.  Physical Exam HENT:     Head: Normocephalic.     Nose:     Comments: Wearing a mask Eyes:     Pupils: Pupils are equal, round, and reactive to light.  Cardiovascular:     Rate and Rhythm: Normal rate.  Pulmonary:     Effort: Pulmonary effort is normal.  Abdominal:     General: Abdomen is flat.     Palpations: Abdomen is soft.  Musculoskeletal:        General: Normal range of motion.  Skin:    Capillary Refill: Capillary refill takes less than 2 seconds.  Neurological:     General: No focal deficit present.     Mental Status: He is alert.  Psychiatric:        Mood and Affect: Mood normal.        Behavior: Behavior normal.        Thought Content: Thought content normal.        Judgment: Judgment normal.    Data: +-----------------+-------------+----------+--------+  Right Cephalic   Diameter (cm)Depth (cm)Findings  +-----------------+-------------+----------+--------+  Shoulder             0.14                         +-----------------+-------------+----------+--------+  Mid upper arm        0.17                         +-----------------+-------------+----------+--------+  Dist upper arm       0.17                         +-----------------+-------------+----------+--------+  Antecubital fossa    0.31                         +-----------------+-------------+----------+--------+  Prox forearm         0.19                         +-----------------+-------------+----------+--------+  Mid forearm          0.25                          +-----------------+-------------+----------+--------+  Wrist                0.23                         +-----------------+-------------+----------+--------+   +-----------------+-------------+----------+--------+  Right Basilic    Diameter (cm)Depth (cm)Findings  +-----------------+-------------+----------+--------+  Mid upper arm        0.23                         +-----------------+-------------+----------+--------+  Dist upper arm       0.27                         +-----------------+-------------+----------+--------+  Antecubital fossa    0.34                         +-----------------+-------------+----------+--------+   +-----------------+-------------+----------+--------+  Left Cephalic    Diameter (cm)Depth (cm)Findings  +-----------------+-------------+----------+--------+  Shoulder             0.17                         +-----------------+-------------+----------+--------+  Mid upper arm        0.20                         +-----------------+-------------+----------+--------+  Dist upper arm       0.18                         +-----------------+-------------+----------+--------+  Antecubital fossa    0.21                         +-----------------+-------------+----------+--------+  Prox forearm         0.27                         +-----------------+-------------+----------+--------+  Mid forearm          0.23                         +-----------------+-------------+----------+--------+  Wrist                0.24                         +-----------------+-------------+----------+--------+    Right Pre-Dialysis Findings:  +-----------------------+----------+--------------------+---------+--------  +  Location               PSV (cm/s)Intralum. Diam. (cm)Waveform  Comments  +-----------------------+----------+--------------------+---------+--------  +  Brachial  Antecub. fossa130       0.45                triphasic            +-----------------------+----------+--------------------+---------+--------  +  Radial Art at Wrist    123       0.29                triphasic            +-----------------------+----------+--------------------+---------+--------  +  Ulnar Art at Wrist     107       0.27                triphasic            +-----------------------+----------+--------------------+---------+--------  +  Left Pre-Dialysis Findings:  +-----------------------+----------+--------------------+---------+--------  +  Location               PSV (cm/s)Intralum. Diam. (cm)Waveform  Comments  +-----------------------+----------+--------------------+---------+--------  +  Brachial Antecub. fossa140       0.58                triphasic            +-----------------------+----------+--------------------+---------+--------  +  Radial Art at Wrist    159       0.29                triphasic            +-----------------------+----------+--------------------+---------+--------  +  Ulnar Art at Wrist     86        0.25                triphasic            +-----------------------+----------+--------------------+---------+--------       Assessment/Plan:     80 year old male with history of left arm two-stage basilic vein AV fistula which has now thrombosed.  He does not appear to have suitable vein in either arm for access creation.  He desires to keep access in the left upper extremity.  Plan is left upper arm AV graft on a nondialysis day in the near future.  I discussed the risk benefits and alternatives that he demonstrates good understanding.     Waynetta Sandy MD [facility]

## 2020-12-09 DIAGNOSIS — N186 End stage renal disease: Secondary | ICD-10-CM | POA: Diagnosis not present

## 2020-12-09 DIAGNOSIS — D8481 Immunodeficiency due to conditions classified elsewhere: Secondary | ICD-10-CM | POA: Diagnosis not present

## 2020-12-09 DIAGNOSIS — N2581 Secondary hyperparathyroidism of renal origin: Secondary | ICD-10-CM | POA: Diagnosis not present

## 2020-12-09 DIAGNOSIS — E1140 Type 2 diabetes mellitus with diabetic neuropathy, unspecified: Secondary | ICD-10-CM | POA: Diagnosis not present

## 2020-12-09 DIAGNOSIS — D631 Anemia in chronic kidney disease: Secondary | ICD-10-CM | POA: Diagnosis not present

## 2020-12-09 DIAGNOSIS — E1122 Type 2 diabetes mellitus with diabetic chronic kidney disease: Secondary | ICD-10-CM | POA: Diagnosis not present

## 2020-12-09 DIAGNOSIS — Z794 Long term (current) use of insulin: Secondary | ICD-10-CM | POA: Diagnosis not present

## 2020-12-09 DIAGNOSIS — R7881 Bacteremia: Secondary | ICD-10-CM | POA: Diagnosis not present

## 2020-12-09 DIAGNOSIS — B9689 Other specified bacterial agents as the cause of diseases classified elsewhere: Secondary | ICD-10-CM | POA: Diagnosis not present

## 2020-12-09 DIAGNOSIS — Z227 Latent tuberculosis: Secondary | ICD-10-CM | POA: Diagnosis not present

## 2020-12-09 DIAGNOSIS — Z992 Dependence on renal dialysis: Secondary | ICD-10-CM | POA: Diagnosis not present

## 2020-12-09 DIAGNOSIS — I120 Hypertensive chronic kidney disease with stage 5 chronic kidney disease or end stage renal disease: Secondary | ICD-10-CM | POA: Diagnosis not present

## 2020-12-09 DIAGNOSIS — Z96641 Presence of right artificial hip joint: Secondary | ICD-10-CM | POA: Diagnosis not present

## 2020-12-09 DIAGNOSIS — Z9181 History of falling: Secondary | ICD-10-CM | POA: Diagnosis not present

## 2020-12-10 ENCOUNTER — Other Ambulatory Visit: Payer: Self-pay

## 2020-12-11 DIAGNOSIS — R7881 Bacteremia: Secondary | ICD-10-CM | POA: Diagnosis not present

## 2020-12-11 DIAGNOSIS — B9689 Other specified bacterial agents as the cause of diseases classified elsewhere: Secondary | ICD-10-CM | POA: Diagnosis not present

## 2020-12-11 DIAGNOSIS — Z992 Dependence on renal dialysis: Secondary | ICD-10-CM | POA: Diagnosis not present

## 2020-12-11 DIAGNOSIS — N186 End stage renal disease: Secondary | ICD-10-CM | POA: Diagnosis not present

## 2020-12-12 ENCOUNTER — Encounter (HOSPITAL_COMMUNITY): Payer: Self-pay | Admitting: Vascular Surgery

## 2020-12-12 NOTE — Progress Notes (Signed)
PCP - Wende Neighbors MD Cardiologist -   PPM/ICD - denies Device Orders -  Rep Notified -   Chest x-ray - 10/23/20 EKG - 10/23/20 Stress Test -  ECHO -  Cardiac Cath -   Sleep Study - no  CPAP - no  Fasting Blood Sugar -100-130 Checks Blood Sugar once times a day  Blood Thinner Instructions:n/a Aspirin Instructions:n/a  ERAS Protcol -no PRE-SURGERY Ensure or G2-   COVID TEST- n/a   Anesthesia review: no    All instructions explained to the patient, with a verbal understanding of the material. Patient agrees to go over the instructions while at home for a better understanding. Patient also instructed to self quarantine after being tested for COVID-19. The opportunity to ask questions was provided.

## 2020-12-13 ENCOUNTER — Telehealth: Payer: Self-pay | Admitting: Orthopaedic Surgery

## 2020-12-13 DIAGNOSIS — Z992 Dependence on renal dialysis: Secondary | ICD-10-CM | POA: Diagnosis not present

## 2020-12-13 DIAGNOSIS — N186 End stage renal disease: Secondary | ICD-10-CM | POA: Diagnosis not present

## 2020-12-13 NOTE — Telephone Encounter (Signed)
Patient called and request an injection in his shoulder it has been hurting him for awhile.   I offered him next Tuesday 12/17/20 he said he could not do that day, I offered him Thursday 12/19/20 and Tuesday 12/24/20.  He could not do any of those dates.    Then he asked me if I had anything sooner?   I advised him again the dates I offered, he said he will try to get it when he goes to [place] next week, if they don't do it he will call us back.

## 2020-12-16 DIAGNOSIS — Z992 Dependence on renal dialysis: Secondary | ICD-10-CM | POA: Diagnosis not present

## 2020-12-16 DIAGNOSIS — N186 End stage renal disease: Secondary | ICD-10-CM | POA: Diagnosis not present

## 2020-12-17 ENCOUNTER — Ambulatory Visit (HOSPITAL_COMMUNITY)
Admission: RE | Admit: 2020-12-17 | Discharge: 2020-12-17 | Disposition: A | Discharge status: Home / Self Care | Payer: Medicare HMO | Attending: Vascular Surgery | Admitting: Vascular Surgery

## 2020-12-17 ENCOUNTER — Ambulatory Visit (HOSPITAL_COMMUNITY): Payer: Medicare HMO

## 2020-12-17 ENCOUNTER — Encounter (HOSPITAL_COMMUNITY): Payer: Self-pay | Admitting: Vascular Surgery

## 2020-12-17 ENCOUNTER — Encounter (HOSPITAL_COMMUNITY)
Admission: RE | Disposition: A | Discharge status: Home / Self Care | Payer: Self-pay | Source: Home / Self Care | Attending: Vascular Surgery

## 2020-12-17 DIAGNOSIS — I120 Hypertensive chronic kidney disease with stage 5 chronic kidney disease or end stage renal disease: Secondary | ICD-10-CM | POA: Diagnosis not present

## 2020-12-17 DIAGNOSIS — Z79899 Other long term (current) drug therapy: Secondary | ICD-10-CM | POA: Insufficient documentation

## 2020-12-17 DIAGNOSIS — N186 End stage renal disease: Secondary | ICD-10-CM | POA: Diagnosis not present

## 2020-12-17 DIAGNOSIS — Z992 Dependence on renal dialysis: Secondary | ICD-10-CM | POA: Insufficient documentation

## 2020-12-17 DIAGNOSIS — E1122 Type 2 diabetes mellitus with diabetic chronic kidney disease: Secondary | ICD-10-CM | POA: Diagnosis not present

## 2020-12-17 DIAGNOSIS — D631 Anemia in chronic kidney disease: Secondary | ICD-10-CM | POA: Diagnosis not present

## 2020-12-17 HISTORY — PX: AV FISTULA PLACEMENT: SHX1204

## 2020-12-17 LAB — POCT I-STAT, CHEM 8
BUN: 53 mg/dL — ABNORMAL HIGH (ref 8–23)
Calcium, Ion: 1.02 mmol/L — ABNORMAL LOW (ref 1.15–1.40)
Chloride: 99 mmol/L (ref 98–111)
Creatinine, Ser: 5.3 mg/dL — ABNORMAL HIGH (ref 0.61–1.24)
Glucose, Bld: 121 mg/dL — ABNORMAL HIGH (ref 70–99)
HCT: 39 % (ref 39.0–52.0)
Hemoglobin: 13.3 g/dL (ref 13.0–17.0)
Potassium: 5.2 mmol/L — ABNORMAL HIGH (ref 3.5–5.1)
Sodium: 134 mmol/L — ABNORMAL LOW (ref 135–145)
TCO2: 27 mmol/L (ref 22–32)

## 2020-12-17 LAB — GLUCOSE, CAPILLARY
Glucose-Capillary: 132 mg/dL — ABNORMAL HIGH (ref 70–99)
Glucose-Capillary: 79 mg/dL (ref 70–99)

## 2020-12-17 SURGERY — INSERTION OF ARTERIOVENOUS (AV) GORE-TEX GRAFT ARM
Anesthesia: General | Site: Arm Lower | Laterality: Left

## 2020-12-17 MED ORDER — OXYCODONE HCL 5 MG PO TABS: 5.0000 mg | ORAL_TABLET | Freq: Four times a day (QID) | ORAL | 0 refills | Status: DC | PRN

## 2020-12-17 MED ORDER — LIDOCAINE 2% (20 MG/ML) 5 ML SYRINGE
INTRAMUSCULAR | Status: AC
  Filled 2020-12-17: qty 5

## 2020-12-17 MED ORDER — CHLORHEXIDINE GLUCONATE 4 % EX LIQD: 60.0000 mL | Freq: Once | CUTANEOUS | Status: DC

## 2020-12-17 MED ORDER — DEXAMETHASONE SODIUM PHOSPHATE 10 MG/ML IJ SOLN
INTRAMUSCULAR | Status: AC
  Filled 2020-12-17: qty 1

## 2020-12-17 MED ORDER — FENTANYL CITRATE (PF) 250 MCG/5ML IJ SOLN
INTRAMUSCULAR | Status: DC | PRN
  Administered 2020-12-17 (×3): 25 ug via INTRAVENOUS

## 2020-12-17 MED ORDER — PHENYLEPHRINE HCL-NACL 20-0.9 MG/250ML-% IV SOLN
INTRAVENOUS | Status: DC | PRN
  Administered 2020-12-17: 40 ug/min via INTRAVENOUS

## 2020-12-17 MED ORDER — LIDOCAINE-EPINEPHRINE (PF) 1 %-1:200000 IJ SOLN
INTRAMUSCULAR | Status: AC
  Filled 2020-12-17: qty 30

## 2020-12-17 MED ORDER — HEPARIN 6000 UNIT IRRIGATION SOLUTION
Status: DC | PRN
  Administered 2020-12-17: 1

## 2020-12-17 MED ORDER — CHLORHEXIDINE GLUCONATE 0.12 % MT SOLN: 15.0000 mL | OROMUCOSAL | Status: AC

## 2020-12-17 MED ORDER — ONDANSETRON HCL 4 MG/2ML IJ SOLN
INTRAMUSCULAR | Status: AC
  Filled 2020-12-17: qty 2

## 2020-12-17 MED ORDER — EPHEDRINE 5 MG/ML INJ
INTRAVENOUS | Status: AC
  Filled 2020-12-17: qty 5

## 2020-12-17 MED ORDER — LIDOCAINE 2% (20 MG/ML) 5 ML SYRINGE
INTRAMUSCULAR | Status: DC | PRN
  Administered 2020-12-17: 60 mg via INTRAVENOUS

## 2020-12-17 MED ORDER — DEXAMETHASONE SODIUM PHOSPHATE 10 MG/ML IJ SOLN
INTRAMUSCULAR | Status: DC | PRN
Start: 2020-12-17 — End: 2020-12-17
  Administered 2020-12-17: 4 mg via INTRAVENOUS

## 2020-12-17 MED ORDER — ONDANSETRON HCL 4 MG/2ML IJ SOLN
INTRAMUSCULAR | Status: DC | PRN
  Administered 2020-12-17: 4 mg via INTRAVENOUS

## 2020-12-17 MED ORDER — FENTANYL CITRATE (PF) 250 MCG/5ML IJ SOLN
INTRAMUSCULAR | Status: AC
  Filled 2020-12-17: qty 5

## 2020-12-17 MED ORDER — EPHEDRINE SULFATE-NACL 50-0.9 MG/10ML-% IV SOSY
PREFILLED_SYRINGE | INTRAVENOUS | Status: DC | PRN
Start: 2020-12-17 — End: 2020-12-17
  Administered 2020-12-17: 15 mg via INTRAVENOUS
  Administered 2020-12-17: 10 mg via INTRAVENOUS
  Administered 2020-12-17: 5 mg via INTRAVENOUS
  Administered 2020-12-17: 10 mg via INTRAVENOUS
  Administered 2020-12-17 (×2): 5 mg via INTRAVENOUS

## 2020-12-17 MED ORDER — PROPOFOL 10 MG/ML IV BOLUS
INTRAVENOUS | Status: DC | PRN
  Administered 2020-12-17: 150 mg via INTRAVENOUS

## 2020-12-17 MED ORDER — HEPARIN 6000 UNIT IRRIGATION SOLUTION
Status: AC
  Filled 2020-12-17: qty 500

## 2020-12-17 MED ORDER — SODIUM CHLORIDE 0.9 % IV SOLN: INTRAVENOUS | Status: DC

## 2020-12-17 MED ORDER — CHLORHEXIDINE GLUCONATE 0.12 % MT SOLN
OROMUCOSAL | Status: AC
  Administered 2020-12-17: 15 mL via OROMUCOSAL
  Filled 2020-12-17: qty 15

## 2020-12-17 MED ORDER — CEFAZOLIN SODIUM-DEXTROSE 2-4 GM/100ML-% IV SOLN
2.0000 g | INTRAVENOUS | Status: AC
  Administered 2020-12-17: 2 g via INTRAVENOUS
  Filled 2020-12-17: qty 100

## 2020-12-17 MED ORDER — SODIUM CHLORIDE 0.9 % IV SOLN: INTRAVENOUS | Status: DC | PRN

## 2020-12-17 MED ORDER — 0.9 % SODIUM CHLORIDE (POUR BTL) OPTIME
TOPICAL | Status: DC | PRN
  Administered 2020-12-17: 1000 mL

## 2020-12-17 SURGICAL SUPPLY — 35 items
ARMBAND PINK RESTRICT EXTREMIT (MISCELLANEOUS) ×4 IMPLANT
BAG COUNTER SPONGE SURGICOUNT (BAG) ×2 IMPLANT
CANISTER SUCT 3000ML PPV (MISCELLANEOUS) ×2 IMPLANT
CLIP LIGATING EXTRA MED SLVR (CLIP) ×2 IMPLANT
CLIP LIGATING EXTRA SM BLUE (MISCELLANEOUS) ×2 IMPLANT
CLIP VESOCCLUDE MED 6/CT (CLIP) ×2 IMPLANT
CLIP VESOCCLUDE SM WIDE 6/CT (CLIP) ×2 IMPLANT
DERMABOND ADVANCED (GAUZE/BANDAGES/DRESSINGS) ×1
DERMABOND ADVANCED .7 DNX12 (GAUZE/BANDAGES/DRESSINGS) ×1 IMPLANT
ELECT REM PT RETURN 9FT ADLT (ELECTROSURGICAL) ×2
ELECTRODE REM PT RTRN 9FT ADLT (ELECTROSURGICAL) ×1 IMPLANT
GAUZE 4X4 16PLY ~~LOC~~+RFID DBL (SPONGE) ×2 IMPLANT
GLOVE SURG ENC MOIS LTX SZ7.5 (GLOVE) ×2 IMPLANT
GOWN STRL REUS W/ TWL LRG LVL3 (GOWN DISPOSABLE) ×2 IMPLANT
GOWN STRL REUS W/ TWL XL LVL3 (GOWN DISPOSABLE) ×1 IMPLANT
GOWN STRL REUS W/TWL LRG LVL3 (GOWN DISPOSABLE) ×2
GOWN STRL REUS W/TWL XL LVL3 (GOWN DISPOSABLE) ×1
GRAFT GORETEX STRT 4-7X45 (Vascular Products) ×2 IMPLANT
HEMOSTAT SNOW SURGICEL 2X4 (HEMOSTASIS) IMPLANT
INSERT FOGARTY SM (MISCELLANEOUS) IMPLANT
KIT BASIN OR (CUSTOM PROCEDURE TRAY) ×2 IMPLANT
KIT TURNOVER KIT B (KITS) ×2 IMPLANT
NS IRRIG 1000ML POUR BTL (IV SOLUTION) ×2 IMPLANT
PACK CV ACCESS (CUSTOM PROCEDURE TRAY) ×2 IMPLANT
PAD ARMBOARD 7.5X6 YLW CONV (MISCELLANEOUS) ×4 IMPLANT
SPONGE T-LAP 18X18 ~~LOC~~+RFID (SPONGE) ×2 IMPLANT
SUT MNCRL AB 4-0 PS2 18 (SUTURE) ×4 IMPLANT
SUT PROLENE 6 0 BV (SUTURE) ×4 IMPLANT
SUT SILK 2 0 SH (SUTURE) IMPLANT
SUT VIC AB 3-0 SH 27 (SUTURE) ×2
SUT VIC AB 3-0 SH 27X BRD (SUTURE) ×2 IMPLANT
SYR TOOMEY 50ML (SYRINGE) IMPLANT
TOWEL GREEN STERILE (TOWEL DISPOSABLE) ×2 IMPLANT
UNDERPAD 30X36 HEAVY ABSORB (UNDERPADS AND DIAPERS) ×2 IMPLANT
WATER STERILE IRR 1000ML POUR (IV SOLUTION) ×2 IMPLANT

## 2020-12-17 NOTE — Interval H&P Note (Signed)
History and Physical Interval Note:  12/17/2020 10:22 AM  Trevor [name]  has presented today for surgery, with the diagnosis of End Stage Renal Disease.  The various methods of treatment have been discussed with the patient and family. After consideration of risks, benefits and other options for treatment, the patient has consented to  Procedure(s): INSERTION OF ARTERIOVENOUS (AV) GORE-TEX GRAFT LEFT ARM (Left) as a surgical intervention.  The patient's history has been reviewed, patient examined, no change in status, stable for surgery.  I have reviewed the patient's chart and labs.  Questions were answered to the patient's satisfaction.     Servando Snare

## 2020-12-17 NOTE — Anesthesia Procedure Notes (Signed)
Procedure Name: LMA Insertion Date/Time: 12/17/2020 10:41 AM Performed by: Janene Harvey, CRNA Pre-anesthesia Checklist: Patient identified, Emergency Drugs available, Suction available and Patient being monitored Patient Re-evaluated:Patient Re-evaluated prior to induction Oxygen Delivery Method: Circle system utilized Preoxygenation: Pre-oxygenation with 100% oxygen Induction Type: IV induction LMA: LMA inserted LMA Size: 5.0 Placement Confirmation: positive ETCO2 Dental Injury: Teeth and Oropharynx as per pre-operative assessment

## 2020-12-17 NOTE — Discharge Instructions (Signed)
   [facility]  Discharge Instructions  AV Fistula or Graft Surgery for Dialysis Access  Please refer to the following instructions for your post-procedure care. Your surgeon or physician assistant will discuss any changes with you.  Activity  You may drive the day following your surgery, if you are comfortable and no longer taking prescription pain medication. Resume full activity as the soreness in your incision resolves.  Bathing/Showering  You may shower after you go home. Keep your incision dry for 48 hours. Do not soak in a bathtub, hot tub, or swim until the incision heals completely. You may not shower if you have a hemodialysis catheter.  Incision Care  Clean your incision with mild soap and water after 48 hours. Pat the area dry with a clean towel. You do not need a bandage unless otherwise instructed. Do not apply any ointments or creams to your incision. You may have skin glue on your incision. Do not peel it off. It will come off on its own in about one week. Your arm may swell a bit after surgery. To reduce swelling use pillows to elevate your arm so it is above your heart. Your doctor will tell you if you need to lightly wrap your arm with an ACE bandage.  Diet  Resume your normal diet. There are not special food restrictions following this procedure. In order to heal from your surgery, it is CRITICAL to get adequate nutrition. Your body requires vitamins, minerals, and protein. Vegetables are the best source of vitamins and minerals. Vegetables also provide the perfect balance of protein. Processed food has little nutritional value, so try to avoid this.  Medications  Resume taking all of your medications. If your incision is causing pain, you may take over-the counter pain relievers such as acetaminophen (Tylenol). If you were prescribed a stronger pain medication, please be aware these medications can cause nausea and constipation. Prevent  nausea by taking the medication with a snack or meal. Avoid constipation by drinking plenty of fluids and eating foods with high amount of fiber, such as fruits, vegetables, and grains.  Do not take Tylenol if you are taking prescription pain medications.  Follow up Your surgeon may want to see you in the office following your access surgery. If so, this will be arranged at the time of your surgery.  Please call us immediately for any of the following conditions:  Increased pain, redness, drainage (pus) from your incision site Fever of 101 degrees or higher Severe or worsening pain at your incision site Hand pain or numbness.  Reduce your risk of vascular disease:  Stop smoking. If you would like help, call [facility] at 1-800-QUIT-NOW 820-744-3512) or Secretary at [place] your cholesterol Maintain a desired weight Control your diabetes Keep your blood pressure down  Dialysis  It will take several weeks to several months for your new dialysis access to be ready for use. Your surgeon will determine when it is okay to use it. Your nephrologist will continue to direct your dialysis. You can continue to use your Permcath until your new access is ready for use.   12/17/2020 Trevor [name] [id number] 1941/03/02  Surgeon(s): Waynetta Sandy, MD  Procedure(s): INSERTION OF ARTERIOVENOUS (AV) GORE-TEX GRAFT LEFT ARM  x Do not stick graft for 4 weeks    If you have any questions, please call the office at [phone].

## 2020-12-17 NOTE — Transfer of Care (Signed)
Immediate Anesthesia Transfer of Care Note  Patient: Trevor [name]  Procedure(s) Performed: INSERTION OF ARTERIOVENOUS (AV) GORE-TEX GRAFT LEFT ARM (Left: Arm Lower)  Patient Location: PACU  Anesthesia Type:General  Level of Consciousness: drowsy and patient cooperative  Airway & Oxygen Therapy: Patient Spontanous Breathing and Patient connected to face mask oxygen  Post-op Assessment: Report given to RN and Post -op Vital signs reviewed and stable  Post vital signs: Reviewed  Last Vitals:  Vitals Value Taken Time  BP 147/55 12/17/20 1153  Temp    Pulse 55 12/17/20 1156  Resp 17 12/17/20 1156  SpO2 100 % 12/17/20 1156  Vitals shown include unvalidated device data.  Last Pain:  Vitals:   12/17/20 0909  TempSrc:   PainSc: 0-No pain      Patients Stated Pain Goal: 0 (53/64/68 0321)  Complications: No notable events documented.

## 2020-12-17 NOTE — Op Note (Signed)
    Patient name: Trevor [name] MRN: [id number] DOB: 03-Nov-1940 Sex: male  12/17/2020 Pre-operative Diagnosis: End-stage renal disease Post-operative diagnosis:  Same Surgeon:  Erlene Quan C. Donzetta Matters, MD Assistant: Leontine Locket, PA Procedure Performed: Left arm brachial artery to basilic vein AV graft creation with 4-7 mm stretch PTFE  Indications: 80 year old male with history of end-stage renal disease currently dialyzing via right IJ catheter.  He has a previous two-stage basilic vein fistula in the left which has thrombosed.  He is now indicated for left upper extremity AV graft.  An assistant was necessary to facilitate exposure and expedite the case.  Findings: I evaluated the upper arm with ultrasound the cephalic vein was quite diminutive.  The basilic vein was thrombosed proximally.  Towards the axilla the basilic vein was patent and had matured quite nicely.  The artery appeared healthy by ultrasound.  We placed the graft from the brachial artery just above the antecubitum to the basilic vein in the axilla.  At completion there was a strong thrill in the graft.  This graft will be ready for use in 3 to 4 weeks.   Procedure:  The patient was identified in the holding area and taken to the operating where he was placed supine operative table and LMA anesthesia was induced.  He was sterilely prepped draped in the left upper extremity usual fashion, antibiotics were administered and a timeout was called.  Ultrasound was used to identify suitable basilic vein for graft outflow and suitable brachial artery above the antecubitum.  There was no other suitable vein for fistula creation with a thrombosed basilic vein and a diminutive cephalic vein.  We made incisions overlying both of these areas dissected down first of the basilic vein in the axilla marked this orientation placed a vessel loop around it.  We opened the previous incision above the antecubitum we dissected out identified the sclerotic  thrombosed fistula and distally this identified the brachial artery placed Vesseloops around this.  For the submillimeter graft was tunneled between the 2 incisions.  We clamped the basilic vein transected and tied it off flushed with heparinized saline and spatulated.  The graft was trimmed to size and sewn end-to-end with 6-0 Prolene suture.  We then flushed through the graft with heparinized saline and reclamped it.  We clamped the brachial artery distal and proximal open longitudinally flushed heparinized saline distally only given the large size and then trimmed the graft to size and sewed the 4 mm and in an end-to-side configuration to the brachial artery.  Prior completion of flushing directions.  Upon completion there was very good flow with palpable thrill in the graft.  We confirmed this with Doppler in the basilic vein and also the radial artery was palpable also confirmed with Doppler.  Satisfied with this we irrigated the wounds obtain pneumostasis and closed in layers with Vicryl and Monocryl.  He was then awakened from anesthesia having tolerated procedure without any complication.  All counts were correct at completion.  EBL: 50 cc   Ranie Chinchilla C. Donzetta Matters, MD [facility] [place] Office: [phone] Pager: [phone]

## 2020-12-17 NOTE — Anesthesia Preprocedure Evaluation (Addendum)
Anesthesia Evaluation  Patient identified by MRN, date of birth, ID band Patient awake    Reviewed: Allergy & Precautions, H&P , NPO status , Patient's Chart, lab work & pertinent test results  Airway Mallampati: II   Neck ROM: full    Dental   Pulmonary neg pulmonary ROS,    breath sounds clear to auscultation       Cardiovascular hypertension, Pt. on home beta blockers and Pt. on medications  Rhythm:regular Rate:Normal     Neuro/Psych negative neurological ROS     GI/Hepatic GERD  ,(+) Hepatitis -  Endo/Other  diabetes, Type 2  Renal/GU ESRFRenal disease     Musculoskeletal negative musculoskeletal ROS (+)   Abdominal   Peds  Hematology HLD   Anesthesia Other Findings   Reproductive/Obstetrics                            Anesthesia Physical Anesthesia Plan  ASA: 3  Anesthesia Plan: General   Post-op Pain Management:    Induction: Intravenous  PONV Risk Score and Plan: 2 and Ondansetron and Treatment may vary due to age or medical condition  Airway Management Planned: LMA  Additional Equipment:   Intra-op Plan:   Post-operative Plan: Extubation in OR  Informed Consent: I have reviewed the patients History and Physical, chart, labs and discussed the procedure including the risks, benefits and alternatives for the proposed anesthesia with the patient or authorized representative who has indicated his/her understanding and acceptance.     Dental advisory given  Plan Discussed with: CRNA, Anesthesiologist and Surgeon  Anesthesia Plan Comments:         Anesthesia Quick Evaluation

## 2020-12-18 ENCOUNTER — Encounter (HOSPITAL_COMMUNITY): Payer: Self-pay | Admitting: Vascular Surgery

## 2020-12-18 DIAGNOSIS — N186 End stage renal disease: Secondary | ICD-10-CM | POA: Diagnosis not present

## 2020-12-18 DIAGNOSIS — Z992 Dependence on renal dialysis: Secondary | ICD-10-CM | POA: Diagnosis not present

## 2020-12-19 NOTE — Anesthesia Postprocedure Evaluation (Signed)
Anesthesia Post Note  Patient: Trevor [name]  Procedure(s) Performed: INSERTION OF ARTERIOVENOUS (AV) GORE-TEX GRAFT LEFT ARM (Left: Arm Lower)     Patient location during evaluation: PACU Anesthesia Type: General Level of consciousness: awake and alert Pain management: pain level controlled Vital Signs Assessment: post-procedure vital signs reviewed and stable Respiratory status: spontaneous breathing, nonlabored ventilation, respiratory function stable and patient connected to nasal cannula oxygen Cardiovascular status: blood pressure returned to baseline and stable Postop Assessment: no apparent nausea or vomiting Anesthetic complications: no   No notable events documented.  Last Vitals:  Vitals:   12/17/20 1215 12/17/20 1230  BP: (!) 135/56 (!) 145/82  Pulse: (!) 56 60  Resp:  18  Temp:  36.4 C  SpO2:  99%    Last Pain:  Vitals:   12/17/20 1230  TempSrc:   PainSc: 0-No pain                 Loral Campi S

## 2020-12-20 DIAGNOSIS — N186 End stage renal disease: Secondary | ICD-10-CM | POA: Diagnosis not present

## 2020-12-20 DIAGNOSIS — Z992 Dependence on renal dialysis: Secondary | ICD-10-CM | POA: Diagnosis not present

## 2020-12-23 DIAGNOSIS — Z992 Dependence on renal dialysis: Secondary | ICD-10-CM | POA: Diagnosis not present

## 2020-12-23 DIAGNOSIS — N186 End stage renal disease: Secondary | ICD-10-CM | POA: Diagnosis not present

## 2020-12-25 DIAGNOSIS — Z992 Dependence on renal dialysis: Secondary | ICD-10-CM | POA: Diagnosis not present

## 2020-12-25 DIAGNOSIS — N186 End stage renal disease: Secondary | ICD-10-CM | POA: Diagnosis not present

## 2020-12-27 DIAGNOSIS — Z992 Dependence on renal dialysis: Secondary | ICD-10-CM | POA: Diagnosis not present

## 2020-12-27 DIAGNOSIS — N186 End stage renal disease: Secondary | ICD-10-CM | POA: Diagnosis not present

## 2020-12-30 DIAGNOSIS — N186 End stage renal disease: Secondary | ICD-10-CM | POA: Diagnosis not present

## 2020-12-30 DIAGNOSIS — Z992 Dependence on renal dialysis: Secondary | ICD-10-CM | POA: Diagnosis not present

## 2021-01-01 DIAGNOSIS — Z992 Dependence on renal dialysis: Secondary | ICD-10-CM | POA: Diagnosis not present

## 2021-01-01 DIAGNOSIS — N186 End stage renal disease: Secondary | ICD-10-CM | POA: Diagnosis not present

## 2021-01-03 DIAGNOSIS — Z992 Dependence on renal dialysis: Secondary | ICD-10-CM | POA: Diagnosis not present

## 2021-01-03 DIAGNOSIS — N186 End stage renal disease: Secondary | ICD-10-CM | POA: Diagnosis not present

## 2021-01-06 DIAGNOSIS — Z992 Dependence on renal dialysis: Secondary | ICD-10-CM | POA: Diagnosis not present

## 2021-01-06 DIAGNOSIS — N186 End stage renal disease: Secondary | ICD-10-CM | POA: Diagnosis not present

## 2021-01-07 ENCOUNTER — Encounter: Payer: Self-pay | Admitting: Orthopaedic Surgery

## 2021-01-07 ENCOUNTER — Ambulatory Visit: Payer: Medicare HMO

## 2021-01-07 ENCOUNTER — Ambulatory Visit (INDEPENDENT_AMBULATORY_CARE_PROVIDER_SITE_OTHER): Payer: Medicare HMO | Admitting: Orthopaedic Surgery

## 2021-01-07 ENCOUNTER — Other Ambulatory Visit: Payer: Self-pay

## 2021-01-07 VITALS — BP 153/83 | HR 99 | Ht 71.0 in | Wt 200.0 lb

## 2021-01-07 DIAGNOSIS — G8929 Other chronic pain: Secondary | ICD-10-CM

## 2021-01-07 DIAGNOSIS — N184 Chronic kidney disease, stage 4 (severe): Secondary | ICD-10-CM | POA: Diagnosis not present

## 2021-01-07 DIAGNOSIS — N2581 Secondary hyperparathyroidism of renal origin: Secondary | ICD-10-CM | POA: Insufficient documentation

## 2021-01-07 DIAGNOSIS — M25511 Pain in right shoulder: Secondary | ICD-10-CM | POA: Diagnosis not present

## 2021-01-07 DIAGNOSIS — Z452 Encounter for adjustment and management of vascular access device: Secondary | ICD-10-CM | POA: Insufficient documentation

## 2021-01-07 DIAGNOSIS — K759 Inflammatory liver disease, unspecified: Secondary | ICD-10-CM | POA: Insufficient documentation

## 2021-01-07 DIAGNOSIS — I1311 Hypertensive heart and chronic kidney disease without heart failure, with stage 5 chronic kidney disease, or end stage renal disease: Secondary | ICD-10-CM | POA: Insufficient documentation

## 2021-01-07 DIAGNOSIS — N12 Tubulo-interstitial nephritis, not specified as acute or chronic: Secondary | ICD-10-CM | POA: Insufficient documentation

## 2021-01-07 NOTE — Progress Notes (Signed)
My right shoulder is hurting.  He has pain of the right shoulder with overhead use and laying on it at night.  He has no trauma, no weakness.  This has been going on for several weeks.  He has tried ice, heat and rubs.  He is on dialysis.  ROM of the right shoulder is full but painful and painful in the extremes.  NV intact.  X-rays were done of the right shoulder, reported separately.  Encounter Diagnoses  Name Primary?   Chronic pain in right shoulder Yes   Chronic kidney disease, stage IV (severe) ([place])    PROCEDURE NOTE:  The patient request injection, verbal consent was obtained.  The right shoulder was prepped appropriately after time out was performed.   Sterile technique was observed and injection of 1 cc of Celestone 6 mg with several cc's of plain xylocaine. Anesthesia was provided by ethyl chloride and a 20-gauge needle was used to inject the shoulder area. A posterior approach was used.  The injection was tolerated well.  A band aid dressing was applied.  The patient was advised to apply ice later today and tomorrow to the injection sight as needed.  Return in one month.  Call if any problem.  Precautions discussed.  Electronically Signed Sanjuana Kava, MD 9/27/20229:17 AM

## 2021-01-08 DIAGNOSIS — Z992 Dependence on renal dialysis: Secondary | ICD-10-CM | POA: Diagnosis not present

## 2021-01-08 DIAGNOSIS — N186 End stage renal disease: Secondary | ICD-10-CM | POA: Diagnosis not present

## 2021-01-10 DIAGNOSIS — E1165 Type 2 diabetes mellitus with hyperglycemia: Secondary | ICD-10-CM | POA: Diagnosis not present

## 2021-01-10 DIAGNOSIS — Z992 Dependence on renal dialysis: Secondary | ICD-10-CM | POA: Diagnosis not present

## 2021-01-10 DIAGNOSIS — N186 End stage renal disease: Secondary | ICD-10-CM | POA: Diagnosis not present

## 2021-01-10 DIAGNOSIS — I10 Essential (primary) hypertension: Secondary | ICD-10-CM | POA: Diagnosis not present

## 2021-01-13 DIAGNOSIS — N186 End stage renal disease: Secondary | ICD-10-CM | POA: Diagnosis not present

## 2021-01-13 DIAGNOSIS — Z992 Dependence on renal dialysis: Secondary | ICD-10-CM | POA: Diagnosis not present

## 2021-01-15 DIAGNOSIS — Z992 Dependence on renal dialysis: Secondary | ICD-10-CM | POA: Diagnosis not present

## 2021-01-15 DIAGNOSIS — N186 End stage renal disease: Secondary | ICD-10-CM | POA: Diagnosis not present

## 2021-01-17 DIAGNOSIS — N186 End stage renal disease: Secondary | ICD-10-CM | POA: Diagnosis not present

## 2021-01-17 DIAGNOSIS — Z992 Dependence on renal dialysis: Secondary | ICD-10-CM | POA: Diagnosis not present

## 2021-01-20 DIAGNOSIS — Z992 Dependence on renal dialysis: Secondary | ICD-10-CM | POA: Diagnosis not present

## 2021-01-20 DIAGNOSIS — N186 End stage renal disease: Secondary | ICD-10-CM | POA: Diagnosis not present

## 2021-01-22 DIAGNOSIS — N186 End stage renal disease: Secondary | ICD-10-CM | POA: Diagnosis not present

## 2021-01-22 DIAGNOSIS — Z992 Dependence on renal dialysis: Secondary | ICD-10-CM | POA: Diagnosis not present

## 2021-01-24 DIAGNOSIS — Z992 Dependence on renal dialysis: Secondary | ICD-10-CM | POA: Diagnosis not present

## 2021-01-24 DIAGNOSIS — N186 End stage renal disease: Secondary | ICD-10-CM | POA: Diagnosis not present

## 2021-01-27 DIAGNOSIS — Z794 Long term (current) use of insulin: Secondary | ICD-10-CM | POA: Diagnosis not present

## 2021-01-27 DIAGNOSIS — E119 Type 2 diabetes mellitus without complications: Secondary | ICD-10-CM | POA: Diagnosis not present

## 2021-01-27 DIAGNOSIS — Z992 Dependence on renal dialysis: Secondary | ICD-10-CM | POA: Diagnosis not present

## 2021-01-27 DIAGNOSIS — N186 End stage renal disease: Secondary | ICD-10-CM | POA: Diagnosis not present

## 2021-01-29 DIAGNOSIS — N186 End stage renal disease: Secondary | ICD-10-CM | POA: Diagnosis not present

## 2021-01-29 DIAGNOSIS — Z992 Dependence on renal dialysis: Secondary | ICD-10-CM | POA: Diagnosis not present

## 2021-01-30 ENCOUNTER — Other Ambulatory Visit: Payer: Self-pay

## 2021-01-30 ENCOUNTER — Encounter: Payer: Self-pay | Admitting: Orthopaedic Surgery

## 2021-01-30 ENCOUNTER — Ambulatory Visit (INDEPENDENT_AMBULATORY_CARE_PROVIDER_SITE_OTHER): Payer: Medicare HMO | Admitting: Orthopaedic Surgery

## 2021-01-30 VITALS — BP 155/72 | HR 93 | Ht 71.0 in | Wt 203.0 lb

## 2021-01-30 DIAGNOSIS — N184 Chronic kidney disease, stage 4 (severe): Secondary | ICD-10-CM

## 2021-01-30 DIAGNOSIS — G8929 Other chronic pain: Secondary | ICD-10-CM

## 2021-01-30 DIAGNOSIS — M25511 Pain in right shoulder: Secondary | ICD-10-CM

## 2021-01-30 NOTE — Progress Notes (Signed)
PROCEDURE NOTE:  The patient request injection, verbal consent was obtained.  The right shoulder was prepped appropriately after time out was performed.   Sterile technique was observed and injection of 1 cc of Celestone 6 mg with several cc's of plain xylocaine. Anesthesia was provided by ethyl chloride and a 20-gauge needle was used to inject the shoulder area. A posterior approach was used.  The injection was tolerated well.  A band aid dressing was applied.  The patient was advised to apply ice later today and tomorrow to the injection sight as needed.  Encounter Diagnoses  Name Primary?   Chronic pain in right shoulder Yes   Chronic kidney disease, stage IV (severe) ([place])    Return in one month.  Electronically Signed Sanjuana Kava, MD 10/20/20229:37 AM

## 2021-01-31 DIAGNOSIS — Z992 Dependence on renal dialysis: Secondary | ICD-10-CM | POA: Diagnosis not present

## 2021-01-31 DIAGNOSIS — N186 End stage renal disease: Secondary | ICD-10-CM | POA: Diagnosis not present

## 2021-02-03 DIAGNOSIS — N186 End stage renal disease: Secondary | ICD-10-CM | POA: Diagnosis not present

## 2021-02-03 DIAGNOSIS — Z992 Dependence on renal dialysis: Secondary | ICD-10-CM | POA: Diagnosis not present

## 2021-02-04 ENCOUNTER — Ambulatory Visit: Payer: Medicare HMO | Admitting: Orthopaedic Surgery

## 2021-02-05 DIAGNOSIS — Z992 Dependence on renal dialysis: Secondary | ICD-10-CM | POA: Diagnosis not present

## 2021-02-05 DIAGNOSIS — N186 End stage renal disease: Secondary | ICD-10-CM | POA: Diagnosis not present

## 2021-02-07 DIAGNOSIS — N186 End stage renal disease: Secondary | ICD-10-CM | POA: Diagnosis not present

## 2021-02-07 DIAGNOSIS — Z992 Dependence on renal dialysis: Secondary | ICD-10-CM | POA: Diagnosis not present

## 2021-02-10 DIAGNOSIS — Z992 Dependence on renal dialysis: Secondary | ICD-10-CM | POA: Diagnosis not present

## 2021-02-10 DIAGNOSIS — N186 End stage renal disease: Secondary | ICD-10-CM | POA: Diagnosis not present

## 2021-02-12 DIAGNOSIS — N186 End stage renal disease: Secondary | ICD-10-CM | POA: Diagnosis not present

## 2021-02-12 DIAGNOSIS — Z992 Dependence on renal dialysis: Secondary | ICD-10-CM | POA: Diagnosis not present

## 2021-02-14 DIAGNOSIS — N186 End stage renal disease: Secondary | ICD-10-CM | POA: Diagnosis not present

## 2021-02-14 DIAGNOSIS — Z992 Dependence on renal dialysis: Secondary | ICD-10-CM | POA: Diagnosis not present

## 2021-02-17 DIAGNOSIS — N186 End stage renal disease: Secondary | ICD-10-CM | POA: Diagnosis not present

## 2021-02-17 DIAGNOSIS — Z992 Dependence on renal dialysis: Secondary | ICD-10-CM | POA: Diagnosis not present

## 2021-02-19 DIAGNOSIS — N186 End stage renal disease: Secondary | ICD-10-CM | POA: Diagnosis not present

## 2021-02-19 DIAGNOSIS — Z992 Dependence on renal dialysis: Secondary | ICD-10-CM | POA: Diagnosis not present

## 2021-02-21 DIAGNOSIS — Z992 Dependence on renal dialysis: Secondary | ICD-10-CM | POA: Diagnosis not present

## 2021-02-21 DIAGNOSIS — N186 End stage renal disease: Secondary | ICD-10-CM | POA: Diagnosis not present

## 2021-02-24 DIAGNOSIS — Z992 Dependence on renal dialysis: Secondary | ICD-10-CM | POA: Diagnosis not present

## 2021-02-24 DIAGNOSIS — N186 End stage renal disease: Secondary | ICD-10-CM | POA: Diagnosis not present

## 2021-02-26 DIAGNOSIS — Z992 Dependence on renal dialysis: Secondary | ICD-10-CM | POA: Diagnosis not present

## 2021-02-26 DIAGNOSIS — N186 End stage renal disease: Secondary | ICD-10-CM | POA: Diagnosis not present

## 2021-02-27 ENCOUNTER — Ambulatory Visit: Payer: Medicare HMO | Admitting: Orthopaedic Surgery

## 2021-02-28 DIAGNOSIS — Z992 Dependence on renal dialysis: Secondary | ICD-10-CM | POA: Diagnosis not present

## 2021-02-28 DIAGNOSIS — T82868A Thrombosis of vascular prosthetic devices, implants and grafts, initial encounter: Secondary | ICD-10-CM | POA: Diagnosis not present

## 2021-02-28 DIAGNOSIS — N186 End stage renal disease: Secondary | ICD-10-CM | POA: Diagnosis not present

## 2021-02-28 DIAGNOSIS — I871 Compression of vein: Secondary | ICD-10-CM | POA: Diagnosis not present

## 2021-03-03 DIAGNOSIS — N186 End stage renal disease: Secondary | ICD-10-CM | POA: Diagnosis not present

## 2021-03-03 DIAGNOSIS — Z992 Dependence on renal dialysis: Secondary | ICD-10-CM | POA: Diagnosis not present

## 2021-03-05 DIAGNOSIS — N186 End stage renal disease: Secondary | ICD-10-CM | POA: Diagnosis not present

## 2021-03-05 DIAGNOSIS — Z992 Dependence on renal dialysis: Secondary | ICD-10-CM | POA: Diagnosis not present

## 2021-03-07 DIAGNOSIS — Z992 Dependence on renal dialysis: Secondary | ICD-10-CM | POA: Diagnosis not present

## 2021-03-07 DIAGNOSIS — N186 End stage renal disease: Secondary | ICD-10-CM | POA: Diagnosis not present

## 2021-03-10 DIAGNOSIS — Z992 Dependence on renal dialysis: Secondary | ICD-10-CM | POA: Diagnosis not present

## 2021-03-10 DIAGNOSIS — N186 End stage renal disease: Secondary | ICD-10-CM | POA: Diagnosis not present

## 2021-03-12 DIAGNOSIS — Z992 Dependence on renal dialysis: Secondary | ICD-10-CM | POA: Diagnosis not present

## 2021-03-12 DIAGNOSIS — N186 End stage renal disease: Secondary | ICD-10-CM | POA: Diagnosis not present

## 2021-03-13 ENCOUNTER — Encounter: Payer: Self-pay | Admitting: Orthopaedic Surgery

## 2021-03-13 ENCOUNTER — Other Ambulatory Visit: Payer: Self-pay

## 2021-03-13 ENCOUNTER — Ambulatory Visit (INDEPENDENT_AMBULATORY_CARE_PROVIDER_SITE_OTHER): Payer: Medicare HMO | Admitting: Orthopaedic Surgery

## 2021-03-13 DIAGNOSIS — G8929 Other chronic pain: Secondary | ICD-10-CM | POA: Diagnosis not present

## 2021-03-13 DIAGNOSIS — M25511 Pain in right shoulder: Secondary | ICD-10-CM

## 2021-03-13 DIAGNOSIS — N184 Chronic kidney disease, stage 4 (severe): Secondary | ICD-10-CM

## 2021-03-13 NOTE — Progress Notes (Signed)
PROCEDURE NOTE:  The patient request injection, verbal consent was obtained.  The right shoulder was prepped appropriately after time out was performed.   Sterile technique was observed and injection of 1 cc of DepoMedrol 40mg  with several cc's of plain xylocaine. Anesthesia was provided by ethyl chloride and a 20-gauge needle was used to inject the shoulder area. A posterior approach was used.  The injection was tolerated well.  A band aid dressing was applied.  The patient was advised to apply ice later today and tomorrow to the injection sight as needed.   Encounter Diagnoses  Name Primary?   Chronic pain in right shoulder Yes   Chronic kidney disease, stage IV (severe) ([place])    Return in one month.  Call if any problem.  Precautions discussed.  Electronically Signed Sanjuana Kava, MD 12/1/20228:50 AM

## 2021-03-14 DIAGNOSIS — Z992 Dependence on renal dialysis: Secondary | ICD-10-CM | POA: Diagnosis not present

## 2021-03-14 DIAGNOSIS — N2581 Secondary hyperparathyroidism of renal origin: Secondary | ICD-10-CM | POA: Diagnosis not present

## 2021-03-14 DIAGNOSIS — N186 End stage renal disease: Secondary | ICD-10-CM | POA: Diagnosis not present

## 2021-03-17 DIAGNOSIS — N186 End stage renal disease: Secondary | ICD-10-CM | POA: Diagnosis not present

## 2021-03-17 DIAGNOSIS — Z992 Dependence on renal dialysis: Secondary | ICD-10-CM | POA: Diagnosis not present

## 2021-03-17 DIAGNOSIS — N2581 Secondary hyperparathyroidism of renal origin: Secondary | ICD-10-CM | POA: Diagnosis not present

## 2021-03-19 DIAGNOSIS — N186 End stage renal disease: Secondary | ICD-10-CM | POA: Diagnosis not present

## 2021-03-19 DIAGNOSIS — Z992 Dependence on renal dialysis: Secondary | ICD-10-CM | POA: Diagnosis not present

## 2021-03-19 DIAGNOSIS — N2581 Secondary hyperparathyroidism of renal origin: Secondary | ICD-10-CM | POA: Diagnosis not present

## 2021-03-21 DIAGNOSIS — N2581 Secondary hyperparathyroidism of renal origin: Secondary | ICD-10-CM | POA: Diagnosis not present

## 2021-03-21 DIAGNOSIS — Z992 Dependence on renal dialysis: Secondary | ICD-10-CM | POA: Diagnosis not present

## 2021-03-21 DIAGNOSIS — N186 End stage renal disease: Secondary | ICD-10-CM | POA: Diagnosis not present

## 2021-03-24 DIAGNOSIS — Z992 Dependence on renal dialysis: Secondary | ICD-10-CM | POA: Diagnosis not present

## 2021-03-24 DIAGNOSIS — N186 End stage renal disease: Secondary | ICD-10-CM | POA: Diagnosis not present

## 2021-03-24 DIAGNOSIS — N2581 Secondary hyperparathyroidism of renal origin: Secondary | ICD-10-CM | POA: Diagnosis not present

## 2021-03-25 DIAGNOSIS — Z992 Dependence on renal dialysis: Secondary | ICD-10-CM | POA: Diagnosis not present

## 2021-03-25 DIAGNOSIS — Z452 Encounter for adjustment and management of vascular access device: Secondary | ICD-10-CM | POA: Diagnosis not present

## 2021-03-25 DIAGNOSIS — N186 End stage renal disease: Secondary | ICD-10-CM | POA: Diagnosis not present

## 2021-03-26 DIAGNOSIS — N186 End stage renal disease: Secondary | ICD-10-CM | POA: Diagnosis not present

## 2021-03-26 DIAGNOSIS — Z992 Dependence on renal dialysis: Secondary | ICD-10-CM | POA: Diagnosis not present

## 2021-03-26 DIAGNOSIS — N2581 Secondary hyperparathyroidism of renal origin: Secondary | ICD-10-CM | POA: Diagnosis not present

## 2021-03-28 DIAGNOSIS — N2581 Secondary hyperparathyroidism of renal origin: Secondary | ICD-10-CM | POA: Diagnosis not present

## 2021-03-28 DIAGNOSIS — Z992 Dependence on renal dialysis: Secondary | ICD-10-CM | POA: Diagnosis not present

## 2021-03-28 DIAGNOSIS — N186 End stage renal disease: Secondary | ICD-10-CM | POA: Diagnosis not present

## 2021-03-31 DIAGNOSIS — N2581 Secondary hyperparathyroidism of renal origin: Secondary | ICD-10-CM | POA: Diagnosis not present

## 2021-03-31 DIAGNOSIS — N186 End stage renal disease: Secondary | ICD-10-CM | POA: Diagnosis not present

## 2021-03-31 DIAGNOSIS — Z992 Dependence on renal dialysis: Secondary | ICD-10-CM | POA: Diagnosis not present

## 2021-04-02 DIAGNOSIS — N186 End stage renal disease: Secondary | ICD-10-CM | POA: Diagnosis not present

## 2021-04-02 DIAGNOSIS — N2581 Secondary hyperparathyroidism of renal origin: Secondary | ICD-10-CM | POA: Diagnosis not present

## 2021-04-02 DIAGNOSIS — Z992 Dependence on renal dialysis: Secondary | ICD-10-CM | POA: Diagnosis not present

## 2021-04-04 DIAGNOSIS — Z992 Dependence on renal dialysis: Secondary | ICD-10-CM | POA: Diagnosis not present

## 2021-04-04 DIAGNOSIS — N2581 Secondary hyperparathyroidism of renal origin: Secondary | ICD-10-CM | POA: Diagnosis not present

## 2021-04-04 DIAGNOSIS — N186 End stage renal disease: Secondary | ICD-10-CM | POA: Diagnosis not present

## 2021-04-07 DIAGNOSIS — N186 End stage renal disease: Secondary | ICD-10-CM | POA: Diagnosis not present

## 2021-04-07 DIAGNOSIS — N2581 Secondary hyperparathyroidism of renal origin: Secondary | ICD-10-CM | POA: Diagnosis not present

## 2021-04-07 DIAGNOSIS — Z992 Dependence on renal dialysis: Secondary | ICD-10-CM | POA: Diagnosis not present

## 2021-04-08 DIAGNOSIS — E669 Obesity, unspecified: Secondary | ICD-10-CM | POA: Diagnosis not present

## 2021-04-08 DIAGNOSIS — R300 Dysuria: Secondary | ICD-10-CM | POA: Diagnosis not present

## 2021-04-08 DIAGNOSIS — Z992 Dependence on renal dialysis: Secondary | ICD-10-CM | POA: Diagnosis not present

## 2021-04-09 DIAGNOSIS — N186 End stage renal disease: Secondary | ICD-10-CM | POA: Diagnosis not present

## 2021-04-09 DIAGNOSIS — N2581 Secondary hyperparathyroidism of renal origin: Secondary | ICD-10-CM | POA: Diagnosis not present

## 2021-04-09 DIAGNOSIS — Z992 Dependence on renal dialysis: Secondary | ICD-10-CM | POA: Diagnosis not present

## 2021-04-11 DIAGNOSIS — Z992 Dependence on renal dialysis: Secondary | ICD-10-CM | POA: Diagnosis not present

## 2021-04-11 DIAGNOSIS — N186 End stage renal disease: Secondary | ICD-10-CM | POA: Diagnosis not present

## 2021-04-11 DIAGNOSIS — N2581 Secondary hyperparathyroidism of renal origin: Secondary | ICD-10-CM | POA: Diagnosis not present

## 2021-04-12 DIAGNOSIS — Z992 Dependence on renal dialysis: Secondary | ICD-10-CM | POA: Diagnosis not present

## 2021-04-12 DIAGNOSIS — N186 End stage renal disease: Secondary | ICD-10-CM | POA: Diagnosis not present

## 2021-04-14 DIAGNOSIS — Z992 Dependence on renal dialysis: Secondary | ICD-10-CM | POA: Diagnosis not present

## 2021-04-14 DIAGNOSIS — N186 End stage renal disease: Secondary | ICD-10-CM | POA: Diagnosis not present

## 2021-04-14 DIAGNOSIS — N2581 Secondary hyperparathyroidism of renal origin: Secondary | ICD-10-CM | POA: Diagnosis not present

## 2021-04-15 ENCOUNTER — Encounter: Payer: Self-pay | Admitting: Orthopaedic Surgery

## 2021-04-15 ENCOUNTER — Other Ambulatory Visit: Payer: Self-pay

## 2021-04-15 ENCOUNTER — Ambulatory Visit (INDEPENDENT_AMBULATORY_CARE_PROVIDER_SITE_OTHER): Payer: Medicare HMO | Admitting: Orthopaedic Surgery

## 2021-04-15 DIAGNOSIS — M25511 Pain in right shoulder: Secondary | ICD-10-CM | POA: Diagnosis not present

## 2021-04-15 DIAGNOSIS — G8929 Other chronic pain: Secondary | ICD-10-CM

## 2021-04-15 NOTE — Progress Notes (Signed)
My shoulder is worse.  He has more pain in the right shoulder.  He has pain with overhead motion.  He has pain rolling on it at night.  He has no new trauma, no paresthesias.  ROM of the right shoulder is painful past 120 abduction and 130 forward motion with limited extension.  NV intact. Grips are good.  There is no effusion.  Encounter Diagnosis  Name Primary?   Chronic pain in right shoulder Yes   I am concerned about rotator cuff tear.  He has not improved and is getting worse.  I will get MRI.  PROCEDURE NOTE:  The patient request injection, verbal consent was obtained.  The right shoulder was prepped appropriately after time out was performed.   Sterile technique was observed and injection of 1 cc of DepoMedrol 40mg  with several cc's of plain xylocaine. Anesthesia was provided by ethyl chloride and a 20-gauge needle was used to inject the shoulder area. A posterior approach was used.  The injection was tolerated well.  A band aid dressing was applied.  The patient was advised to apply ice later today and tomorrow to the injection sight as needed.   Return in one month.  Call if any problem.  Precautions discussed.  Electronically Signed Sanjuana Kava, MD 1/3/20238:42 AM

## 2021-04-15 NOTE — Patient Instructions (Signed)
Please call [phone] to schedule MRI.

## 2021-04-16 DIAGNOSIS — N186 End stage renal disease: Secondary | ICD-10-CM | POA: Diagnosis not present

## 2021-04-16 DIAGNOSIS — N2581 Secondary hyperparathyroidism of renal origin: Secondary | ICD-10-CM | POA: Diagnosis not present

## 2021-04-16 DIAGNOSIS — Z992 Dependence on renal dialysis: Secondary | ICD-10-CM | POA: Diagnosis not present

## 2021-04-18 DIAGNOSIS — N186 End stage renal disease: Secondary | ICD-10-CM | POA: Diagnosis not present

## 2021-04-18 DIAGNOSIS — Z992 Dependence on renal dialysis: Secondary | ICD-10-CM | POA: Diagnosis not present

## 2021-04-18 DIAGNOSIS — N2581 Secondary hyperparathyroidism of renal origin: Secondary | ICD-10-CM | POA: Diagnosis not present

## 2021-04-21 DIAGNOSIS — N2581 Secondary hyperparathyroidism of renal origin: Secondary | ICD-10-CM | POA: Diagnosis not present

## 2021-04-21 DIAGNOSIS — N186 End stage renal disease: Secondary | ICD-10-CM | POA: Diagnosis not present

## 2021-04-21 DIAGNOSIS — Z992 Dependence on renal dialysis: Secondary | ICD-10-CM | POA: Diagnosis not present

## 2021-04-23 DIAGNOSIS — N186 End stage renal disease: Secondary | ICD-10-CM | POA: Diagnosis not present

## 2021-04-23 DIAGNOSIS — Z992 Dependence on renal dialysis: Secondary | ICD-10-CM | POA: Diagnosis not present

## 2021-04-23 DIAGNOSIS — N2581 Secondary hyperparathyroidism of renal origin: Secondary | ICD-10-CM | POA: Diagnosis not present

## 2021-04-25 DIAGNOSIS — Z992 Dependence on renal dialysis: Secondary | ICD-10-CM | POA: Diagnosis not present

## 2021-04-25 DIAGNOSIS — N186 End stage renal disease: Secondary | ICD-10-CM | POA: Diagnosis not present

## 2021-04-25 DIAGNOSIS — N2581 Secondary hyperparathyroidism of renal origin: Secondary | ICD-10-CM | POA: Diagnosis not present

## 2021-04-28 DIAGNOSIS — Z992 Dependence on renal dialysis: Secondary | ICD-10-CM | POA: Diagnosis not present

## 2021-04-28 DIAGNOSIS — N186 End stage renal disease: Secondary | ICD-10-CM | POA: Diagnosis not present

## 2021-04-28 DIAGNOSIS — N2581 Secondary hyperparathyroidism of renal origin: Secondary | ICD-10-CM | POA: Diagnosis not present

## 2021-04-30 DIAGNOSIS — N2581 Secondary hyperparathyroidism of renal origin: Secondary | ICD-10-CM | POA: Diagnosis not present

## 2021-04-30 DIAGNOSIS — N186 End stage renal disease: Secondary | ICD-10-CM | POA: Diagnosis not present

## 2021-04-30 DIAGNOSIS — Z992 Dependence on renal dialysis: Secondary | ICD-10-CM | POA: Diagnosis not present

## 2021-05-01 ENCOUNTER — Ambulatory Visit (HOSPITAL_COMMUNITY)
Admission: RE | Admit: 2021-05-01 | Discharge: 2021-05-01 | Disposition: A | Discharge status: Home / Self Care | Payer: Medicare HMO | Source: Ambulatory Visit | Attending: Orthopaedic Surgery | Admitting: Orthopaedic Surgery

## 2021-05-01 ENCOUNTER — Other Ambulatory Visit: Payer: Self-pay

## 2021-05-01 DIAGNOSIS — G8929 Other chronic pain: Secondary | ICD-10-CM | POA: Diagnosis not present

## 2021-05-01 DIAGNOSIS — M25511 Pain in right shoulder: Secondary | ICD-10-CM | POA: Diagnosis not present

## 2021-05-01 IMAGING — MR MR SHOULDER*R* W/O CM
8 series · 40 of 40 positions shown · non-contrast
Comparison: Right shoulder x-rays dated [DATE].

CLINICAL DATA: Chronic right shoulder pain.

EXAM:
MRI OF THE RIGHT SHOULDER WITHOUT CONTRAST
TECHNIQUE: Multiplanar, multisequence MR imaging of the shoulder was performed.
No intravenous contrast was administered.

[Series 5: T2 fat-sat · axial · right · 4.0mm · 0.47mm/px · z∈[-54,+64]mm · 5 of 26 slices shown (1 of 4)]
[im 1/26]
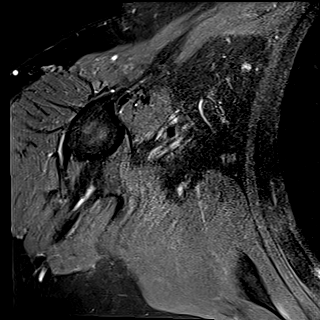
[im 7/26]
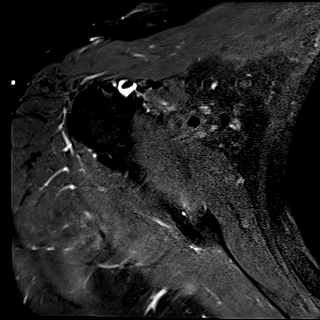
[im 13/26]
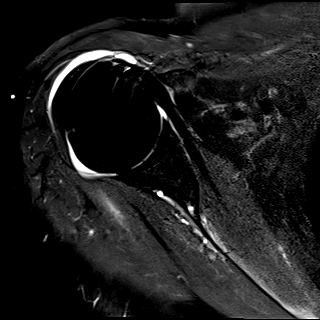
[im 19/26]
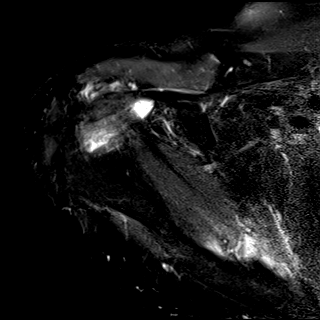
[im 26/26]
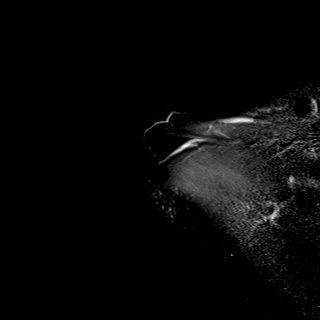

[Series 6: T2 fat-sat · oblique · right · 4.0mm · 0.47mm/px · 5 of 26 slices shown (2 of 4)]
[im 1/26]
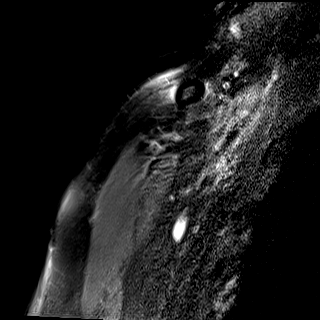
[im 7/26]
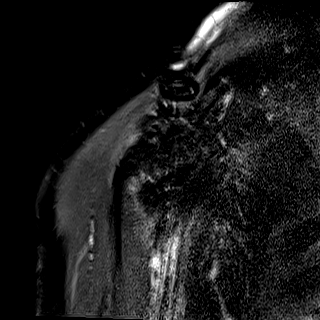
[im 13/26]
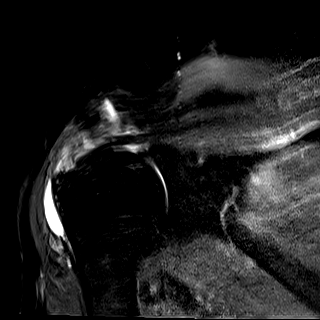
[im 19/26]
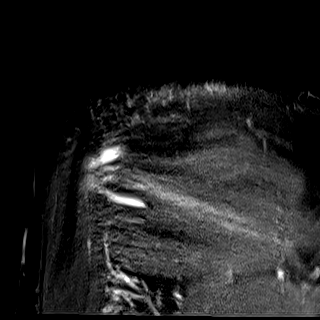
[im 26/26]
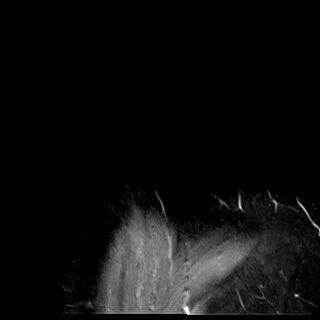

[Series 7: T2 fat-sat · oblique · right · 4.0mm · 0.47mm/px · 5 of 26 slices shown (3 of 4)]
[im 1/26]
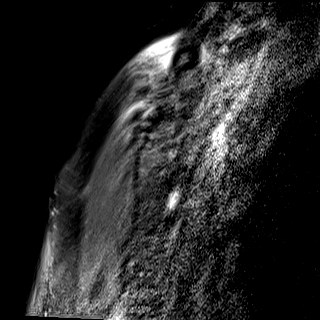
[im 7/26]
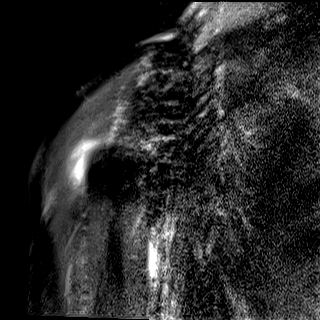
[im 13/26]
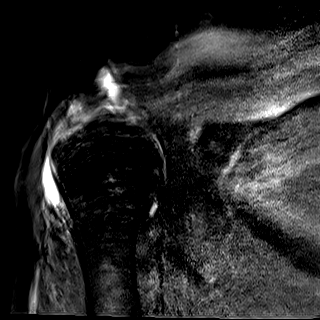
[im 19/26]
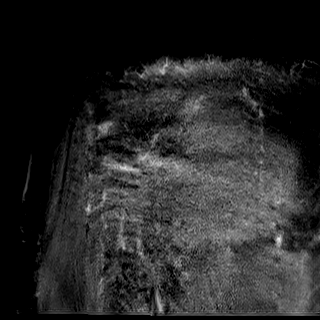
[im 26/26]
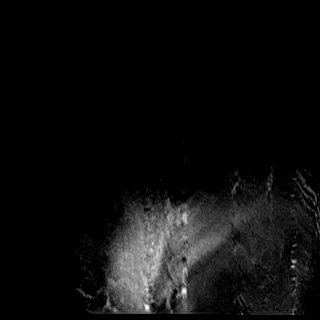

[Series 8: PD · oblique · right · 4.0mm · 0.47mm/px · 5 of 26 slices shown]
[im 1/26]
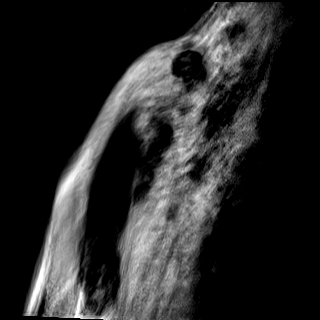
[im 7/26]
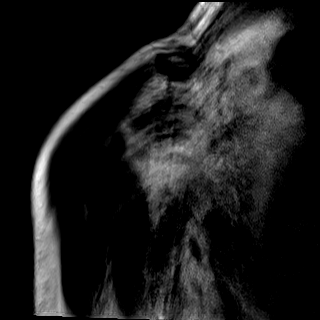
[im 13/26]
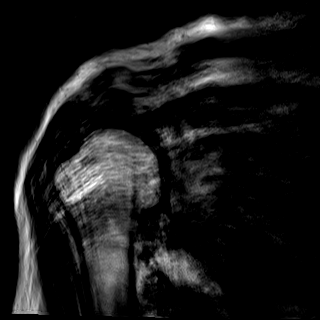
[im 19/26]
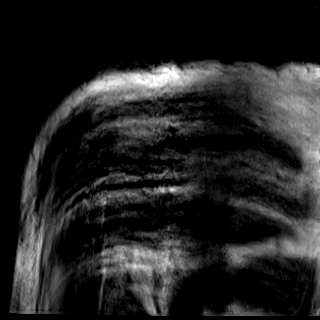
[im 26/26]
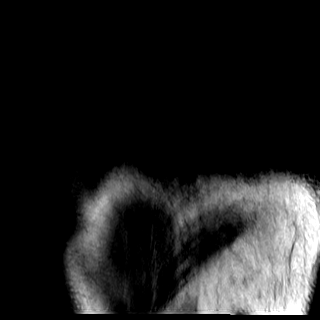

[Series 9: t2_blade_fs_sag · oblique · right · 4.0mm · 0.46mm/px · 5 of 25 slices shown]
[im 1/25]
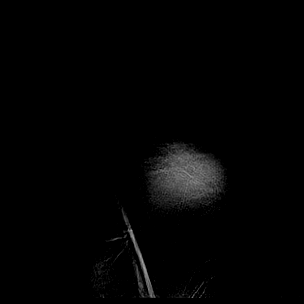
[im 7/25]
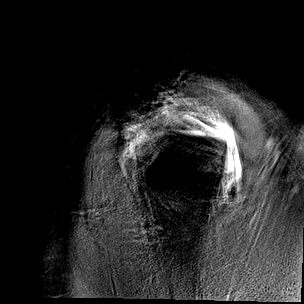
[im 13/25]
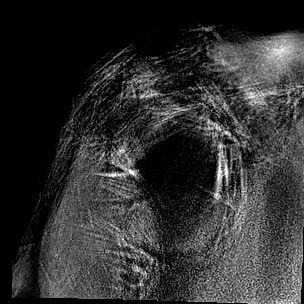
[im 19/25]
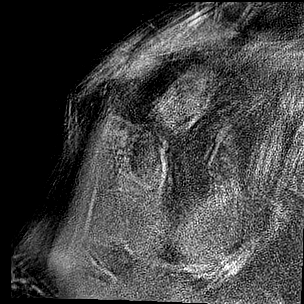
[im 25/25]
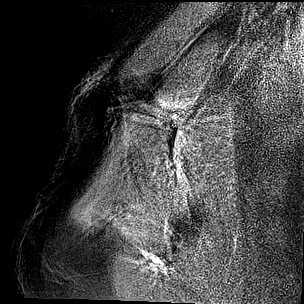

[Series 10: T1 · oblique · right · 4.0mm · 0.47mm/px · 5 of 25 slices shown]
[im 1/25]
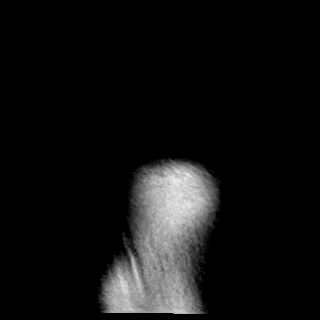
[im 7/25]
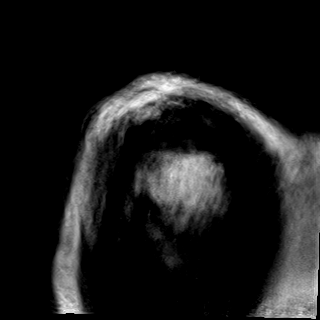
[im 13/25]
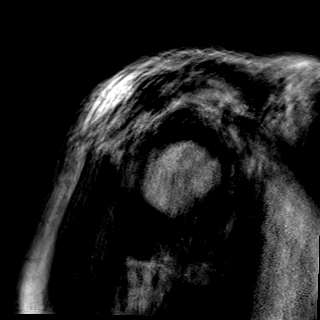
[im 19/25]
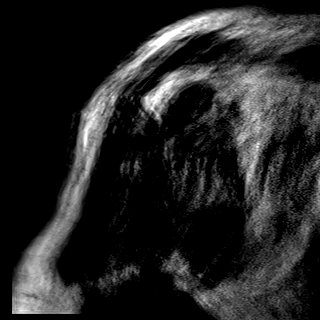
[im 25/25]
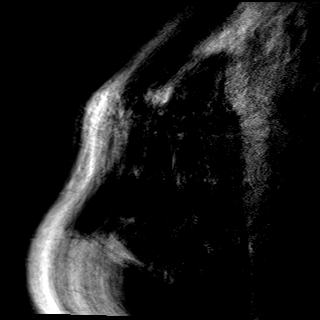

[Series 11: T2 fat-sat · oblique · right · 4.0mm · 0.59mm/px · 5 of 26 slices shown (4 of 4)]
[im 1/26]
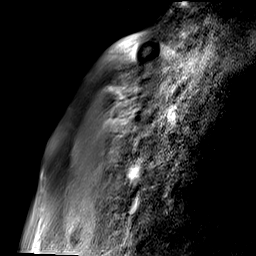
[im 7/26]
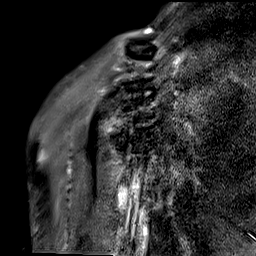
[im 13/26]
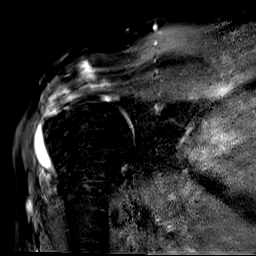
[im 19/26]
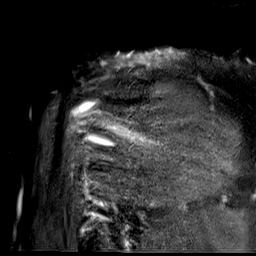
[im 26/26]
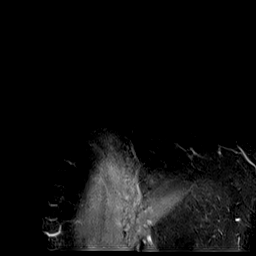

[Series 12: t2_blade_fs_tra_256 · axial · right · 4.0mm · 0.59mm/px · z∈[-52,+61]mm · 5 of 25 slices shown]
[im 1/25]
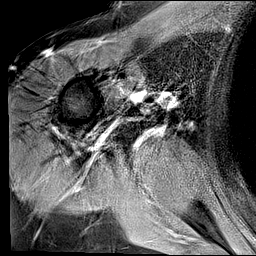
[im 7/25]
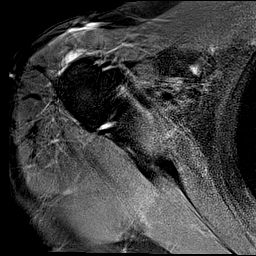
[im 13/25]
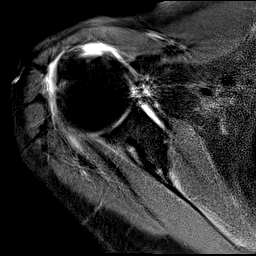
[im 19/25]
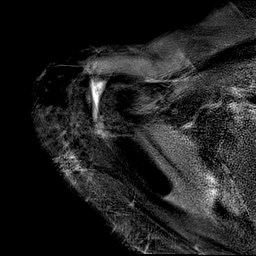
[im 25/25]
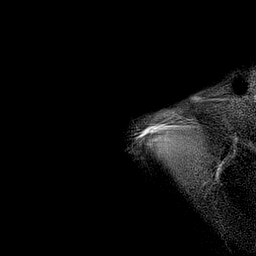

[40 of 40 positions shown; findings below may reference images not displayed]

FINDINGS: Severely limited by motion artifact.

Rotator cuff: High-grade partial-thickness bursal surface tear of
the supraspinatus tendon at the insertion with intrasubstance
delamination proximally. Distal infraspinatus tendinosis. The
subscapularis and teres minor tendons are unremarkable.

Muscles: No atrophy or abnormal signal of the muscles of the rotator
cuff.

Biceps long head:  Intact and normally positioned.

Acromioclavicular Joint: Moderate arthropathy of the
acromioclavicular joint. Type II acromion. Small amount of fluid in
the subacromial/subdeltoid bursa.

Glenohumeral Joint: No joint effusion. No chondral defect.

Labrum: Grossly intact, but evaluation is limited by lack of
intraarticular fluid.

Bones:  No marrow abnormality, fracture or dislocation.

Other: None.
IMPRESSION: 1. Study severely limited by motion artifact.
2. High-grade partial-thickness bursal surface tear of the
supraspinatus tendon at the insertion with intrasubstance
delamination proximally.
3. Mild subacromial/subdeltoid bursitis.

## 2021-05-02 DIAGNOSIS — N186 End stage renal disease: Secondary | ICD-10-CM | POA: Diagnosis not present

## 2021-05-02 DIAGNOSIS — N2581 Secondary hyperparathyroidism of renal origin: Secondary | ICD-10-CM | POA: Diagnosis not present

## 2021-05-02 DIAGNOSIS — Z992 Dependence on renal dialysis: Secondary | ICD-10-CM | POA: Diagnosis not present

## 2021-05-05 DIAGNOSIS — N2581 Secondary hyperparathyroidism of renal origin: Secondary | ICD-10-CM | POA: Diagnosis not present

## 2021-05-05 DIAGNOSIS — Z992 Dependence on renal dialysis: Secondary | ICD-10-CM | POA: Diagnosis not present

## 2021-05-05 DIAGNOSIS — N186 End stage renal disease: Secondary | ICD-10-CM | POA: Diagnosis not present

## 2021-05-07 DIAGNOSIS — N186 End stage renal disease: Secondary | ICD-10-CM | POA: Diagnosis not present

## 2021-05-07 DIAGNOSIS — Z992 Dependence on renal dialysis: Secondary | ICD-10-CM | POA: Diagnosis not present

## 2021-05-07 DIAGNOSIS — N2581 Secondary hyperparathyroidism of renal origin: Secondary | ICD-10-CM | POA: Diagnosis not present

## 2021-05-09 DIAGNOSIS — N186 End stage renal disease: Secondary | ICD-10-CM | POA: Diagnosis not present

## 2021-05-09 DIAGNOSIS — N2581 Secondary hyperparathyroidism of renal origin: Secondary | ICD-10-CM | POA: Diagnosis not present

## 2021-05-09 DIAGNOSIS — Z992 Dependence on renal dialysis: Secondary | ICD-10-CM | POA: Diagnosis not present

## 2021-05-12 ENCOUNTER — Other Ambulatory Visit: Payer: Self-pay | Admitting: "Endocrinology

## 2021-05-12 DIAGNOSIS — N186 End stage renal disease: Secondary | ICD-10-CM | POA: Diagnosis not present

## 2021-05-12 DIAGNOSIS — E041 Nontoxic single thyroid nodule: Secondary | ICD-10-CM

## 2021-05-12 DIAGNOSIS — N2581 Secondary hyperparathyroidism of renal origin: Secondary | ICD-10-CM | POA: Diagnosis not present

## 2021-05-12 DIAGNOSIS — Z992 Dependence on renal dialysis: Secondary | ICD-10-CM | POA: Diagnosis not present

## 2021-05-13 DIAGNOSIS — E782 Mixed hyperlipidemia: Secondary | ICD-10-CM | POA: Diagnosis not present

## 2021-05-13 DIAGNOSIS — I10 Essential (primary) hypertension: Secondary | ICD-10-CM | POA: Diagnosis not present

## 2021-05-13 DIAGNOSIS — N186 End stage renal disease: Secondary | ICD-10-CM | POA: Diagnosis not present

## 2021-05-13 DIAGNOSIS — Z992 Dependence on renal dialysis: Secondary | ICD-10-CM | POA: Diagnosis not present

## 2021-05-14 DIAGNOSIS — N186 End stage renal disease: Secondary | ICD-10-CM | POA: Diagnosis not present

## 2021-05-14 DIAGNOSIS — N2581 Secondary hyperparathyroidism of renal origin: Secondary | ICD-10-CM | POA: Diagnosis not present

## 2021-05-14 DIAGNOSIS — Z992 Dependence on renal dialysis: Secondary | ICD-10-CM | POA: Diagnosis not present

## 2021-05-15 ENCOUNTER — Other Ambulatory Visit: Payer: Self-pay

## 2021-05-15 ENCOUNTER — Ambulatory Visit (INDEPENDENT_AMBULATORY_CARE_PROVIDER_SITE_OTHER): Payer: Medicare HMO | Admitting: Orthopaedic Surgery

## 2021-05-15 ENCOUNTER — Encounter: Payer: Self-pay | Admitting: Orthopaedic Surgery

## 2021-05-15 DIAGNOSIS — Z992 Dependence on renal dialysis: Secondary | ICD-10-CM | POA: Diagnosis not present

## 2021-05-15 DIAGNOSIS — N184 Chronic kidney disease, stage 4 (severe): Secondary | ICD-10-CM | POA: Diagnosis not present

## 2021-05-15 DIAGNOSIS — N186 End stage renal disease: Secondary | ICD-10-CM

## 2021-05-15 DIAGNOSIS — G8929 Other chronic pain: Secondary | ICD-10-CM | POA: Diagnosis not present

## 2021-05-15 DIAGNOSIS — M25511 Pain in right shoulder: Secondary | ICD-10-CM | POA: Diagnosis not present

## 2021-05-15 NOTE — Patient Instructions (Signed)
Return to clinic to see Dr. Amedeo Kinsman to discuss possible surgery

## 2021-05-15 NOTE — Progress Notes (Signed)
My shoulder hurts more  He has had more pain in the right shoulder with the cold rainy weather.  He has no new trauma.  MRI of the right shoulder was done showing: IMPRESSION: 1. Study severely limited by motion artifact. 2. High-grade partial-thickness bursal surface tear of the supraspinatus tendon at the insertion with intrasubstance delamination proximally. 3. Mild subacromial/subdeltoid bursitis.  I have explained the findings to him.  I will have Dr. Amedeo Kinsman see him.  The injection helps only about two days.    I have independently reviewed the MRI.    Right shoulder has good ROM with pain in the extremes.  NV intact.  Encounter Diagnoses  Name Primary?   Chronic pain in right shoulder Yes   Chronic kidney disease, stage IV (severe) ([facility])    End-stage renal disease needing dialysis [facility])    To see Dr. Amedeo Kinsman.  Call if any problem.  Precautions discussed.  Electronically Signed Sanjuana Kava, MD 2/2/20238:26 AM

## 2021-05-16 DIAGNOSIS — N186 End stage renal disease: Secondary | ICD-10-CM | POA: Diagnosis not present

## 2021-05-16 DIAGNOSIS — Z992 Dependence on renal dialysis: Secondary | ICD-10-CM | POA: Diagnosis not present

## 2021-05-16 DIAGNOSIS — N2581 Secondary hyperparathyroidism of renal origin: Secondary | ICD-10-CM | POA: Diagnosis not present

## 2021-05-19 ENCOUNTER — Ambulatory Visit: Payer: Medicare HMO | Admitting: "Endocrinology

## 2021-05-19 DIAGNOSIS — N2581 Secondary hyperparathyroidism of renal origin: Secondary | ICD-10-CM | POA: Diagnosis not present

## 2021-05-19 DIAGNOSIS — N186 End stage renal disease: Secondary | ICD-10-CM | POA: Diagnosis not present

## 2021-05-19 DIAGNOSIS — Z992 Dependence on renal dialysis: Secondary | ICD-10-CM | POA: Diagnosis not present

## 2021-05-21 DIAGNOSIS — N186 End stage renal disease: Secondary | ICD-10-CM | POA: Diagnosis not present

## 2021-05-21 DIAGNOSIS — Z992 Dependence on renal dialysis: Secondary | ICD-10-CM | POA: Diagnosis not present

## 2021-05-21 DIAGNOSIS — N2581 Secondary hyperparathyroidism of renal origin: Secondary | ICD-10-CM | POA: Diagnosis not present

## 2021-05-23 DIAGNOSIS — N186 End stage renal disease: Secondary | ICD-10-CM | POA: Diagnosis not present

## 2021-05-23 DIAGNOSIS — N2581 Secondary hyperparathyroidism of renal origin: Secondary | ICD-10-CM | POA: Diagnosis not present

## 2021-05-23 DIAGNOSIS — Z992 Dependence on renal dialysis: Secondary | ICD-10-CM | POA: Diagnosis not present

## 2021-05-26 ENCOUNTER — Other Ambulatory Visit: Payer: Self-pay | Admitting: "Endocrinology

## 2021-05-26 DIAGNOSIS — N186 End stage renal disease: Secondary | ICD-10-CM | POA: Diagnosis not present

## 2021-05-26 DIAGNOSIS — N2581 Secondary hyperparathyroidism of renal origin: Secondary | ICD-10-CM | POA: Diagnosis not present

## 2021-05-26 DIAGNOSIS — Z992 Dependence on renal dialysis: Secondary | ICD-10-CM | POA: Diagnosis not present

## 2021-05-27 ENCOUNTER — Ambulatory Visit: Payer: Medicare HMO | Admitting: Orthopedic Surgery

## 2021-05-27 ENCOUNTER — Encounter: Payer: Self-pay | Admitting: Orthopedic Surgery

## 2021-05-27 ENCOUNTER — Other Ambulatory Visit: Payer: Self-pay

## 2021-05-27 VITALS — BP 121/79 | HR 89 | Ht 71.0 in | Wt 206.0 lb

## 2021-05-27 DIAGNOSIS — S46011A Strain of muscle(s) and tendon(s) of the rotator cuff of right shoulder, initial encounter: Secondary | ICD-10-CM

## 2021-05-27 NOTE — Progress Notes (Signed)
New Patient Visit  Assessment: Trevor [name] is a 81 y.o. male with the following: 1. Traumatic incomplete tear of right rotator cuff, initial encounter  Plan: Mr. Vale has a partial-thickness rotator cuff tear in the right shoulder.  MRI quality is poor, and it is difficult to discern the severity.  In addition, the report suggest there is delamination of the tissue.  Based on his age and end-stage renal disease on dialysis, I am concerned about the tissue quality.  As a result, I am reluctant to consider surgery at this time.  We also discussed that any potential surgery is high risk given his dialysis status.  He states his understanding.  At this point, I recommended an image guided glenohumeral joint injection.  This will be scheduled.  I have urged him to pay attention to how well this affects his pain.  Consider tramadol or Flexeril, especially at nighttime to help with his pain.  We will see him back on an as-needed basis.   Follow-up: Return if symptoms worsen or fail to improve.  Subjective:  Chief Complaint  Patient presents with   Shoulder Pain    Rt shoulder     History of Present Illness: Trevor [name] is a 81 y.o. male who presents for evaluation of right shoulder pain.  He has previously been evaluated by Dr. Luna Glasgow.  He states he fell, last June, and since then he has been having some right shoulder pain.  Pain gets worse at night.  He notes pain radiating distally into his biceps.  He has difficulty with overhead motion.  He is taking Tylenol, which helps with some of the pain.  He has had multiple injections in his right shoulder, which only helped for couple of days.  He states he has difficulty sleeping on his right shoulder at nighttime.  Pain gets worse at night.  He has not worked with physical therapy.  He has done some exercises on his own.   Review of Systems: No fevers or chills No numbness or tingling No chest pain No shortness of breath No bowel  or bladder dysfunction No GI distress No headaches   Medical History:  Past Medical History:  Diagnosis Date   Anemia    Chronic kidney disease    Stage 4?    Diabetes mellitus without complication ([place])    Hepatitis    not sure what type - over 20 years ago   History of kidney stones    Hyperlipidemia    Hypertension     Past Surgical History:  Procedure Laterality Date   AV FISTULA PLACEMENT Left 08/09/2019   Procedure: LEFT ARM Basilic  ARTERIOVENOUS  FISTULA CREATION;  Surgeon: Waynetta Sandy, MD;  Location: [place];  Service: Vascular;  Laterality: Left;   AV FISTULA PLACEMENT Left 12/17/2020   Procedure: INSERTION OF ARTERIOVENOUS (AV) GORE-TEX GRAFT LEFT ARM;  Surgeon: Waynetta Sandy, MD;  Location: [place];  Service: Vascular;  Laterality: Left;   [place] Left 11/30/2019   Procedure: LEFT SECOND STAGE [place];  Surgeon: Waynetta Sandy, MD;  Location: [place];  Service: Vascular;  Laterality: Left;   COLONOSCOPY N/A 07/07/2017   Procedure: COLONOSCOPY;  Surgeon: Danie Binder, MD;  [facility];  Service: Endoscopy;  Laterality: N/A;  8:30   IR FLUORO GUIDE CV LINE RIGHT  09/06/2020   IR US GUIDE VASC ACCESS RIGHT  09/06/2020   TOTAL HIP ARTHROPLASTY Right 10/09/2020  Procedure: TOTAL HIP ARTHROPLASTY ANTERIOR APPROACH;  Surgeon: Rod Can, MD;  Location: [place];  Service: Orthopedics;  Laterality: Right;    Family History  Problem Relation Age of Onset   Diabetes Mother    Diabetes Brother    Heart attack Brother    Colon cancer Son    Lung cancer Son    Social History   Tobacco Use   Smoking status: Never   Smokeless tobacco: Never  Vaping Use   Vaping Use: Never used  Substance Use Topics   Alcohol use: Never   Drug use: Never    No Known Allergies  Current Meds  Medication Sig   carvedilol (COREG) 12.5 MG tablet Take 12.5 mg by mouth 2 (two) times daily with a meal.    cloNIDine (CATAPRES - DOSED IN MG/24 HR) 0.2 mg/24hr patch Place 0.2 mg onto the skin once a week.   hydrALAZINE (APRESOLINE) 100 MG tablet Take 100 mg by mouth 3 (three) times daily.    Objective: BP 121/79    Pulse 89    Ht 5\' 11"  (1.803 m)    Wt 206 lb (93.4 kg)    BMI 28.73 kg/m   Physical Exam:  General: Alert and oriented. and No acute distress. Gait: Normal gait.  Evaluation of the right shoulder demonstrates no deformity.  He has mild tenderness to palpation at the [facility] joint.  Pain with cross body adduction.  Forward flexion to 140 degrees, before it is uncomfortable.  Internal rotation to the lumbar spine.  Strength in the supraspinatus, infraspinatus and subscapularis is all 4/5.  Pain in the empty can testing position.  IMAGING: I personally reviewed images previously obtained in clinic  Right shoulder x-ray with [facility] joint arthritis.  Available imaging suggests maintenance of glenohumeral joint space.  Right shoulder MRI  Impression: 1. Study severely limited by motion artifact. 2. High-grade partial-thickness bursal surface tear of the supraspinatus tendon at the insertion with intrasubstance delamination proximally. 3. Mild subacromial/subdeltoid bursitis.   New Medications:  No orders of the defined types were placed in this encounter.     Mordecai Rasmussen, MD  05/27/2021 9:05 AM

## 2021-05-27 NOTE — Patient Instructions (Addendum)
Central Scheduling [phone]     Consider flexeril or tramadol for pain, especially at night  We will schedule an image guided right shoulder injection, to be scheduled at [place] attention to how much relief you get from this injection  Contact the clinic with issues  Follow up as needed

## 2021-05-28 DIAGNOSIS — N2581 Secondary hyperparathyroidism of renal origin: Secondary | ICD-10-CM | POA: Diagnosis not present

## 2021-05-28 DIAGNOSIS — Z992 Dependence on renal dialysis: Secondary | ICD-10-CM | POA: Diagnosis not present

## 2021-05-28 DIAGNOSIS — N186 End stage renal disease: Secondary | ICD-10-CM | POA: Diagnosis not present

## 2021-05-30 DIAGNOSIS — N186 End stage renal disease: Secondary | ICD-10-CM | POA: Diagnosis not present

## 2021-05-30 DIAGNOSIS — N2581 Secondary hyperparathyroidism of renal origin: Secondary | ICD-10-CM | POA: Diagnosis not present

## 2021-05-30 DIAGNOSIS — Z992 Dependence on renal dialysis: Secondary | ICD-10-CM | POA: Diagnosis not present

## 2021-06-02 DIAGNOSIS — Z992 Dependence on renal dialysis: Secondary | ICD-10-CM | POA: Diagnosis not present

## 2021-06-02 DIAGNOSIS — N186 End stage renal disease: Secondary | ICD-10-CM | POA: Diagnosis not present

## 2021-06-02 DIAGNOSIS — N2581 Secondary hyperparathyroidism of renal origin: Secondary | ICD-10-CM | POA: Diagnosis not present

## 2021-06-04 DIAGNOSIS — Z992 Dependence on renal dialysis: Secondary | ICD-10-CM | POA: Diagnosis not present

## 2021-06-04 DIAGNOSIS — N186 End stage renal disease: Secondary | ICD-10-CM | POA: Diagnosis not present

## 2021-06-04 DIAGNOSIS — N2581 Secondary hyperparathyroidism of renal origin: Secondary | ICD-10-CM | POA: Diagnosis not present

## 2021-06-06 DIAGNOSIS — N186 End stage renal disease: Secondary | ICD-10-CM | POA: Diagnosis not present

## 2021-06-06 DIAGNOSIS — Z992 Dependence on renal dialysis: Secondary | ICD-10-CM | POA: Diagnosis not present

## 2021-06-06 DIAGNOSIS — N2581 Secondary hyperparathyroidism of renal origin: Secondary | ICD-10-CM | POA: Diagnosis not present

## 2021-06-09 DIAGNOSIS — Z992 Dependence on renal dialysis: Secondary | ICD-10-CM | POA: Diagnosis not present

## 2021-06-09 DIAGNOSIS — N186 End stage renal disease: Secondary | ICD-10-CM | POA: Diagnosis not present

## 2021-06-09 DIAGNOSIS — N2581 Secondary hyperparathyroidism of renal origin: Secondary | ICD-10-CM | POA: Diagnosis not present

## 2021-06-10 DIAGNOSIS — Z992 Dependence on renal dialysis: Secondary | ICD-10-CM | POA: Diagnosis not present

## 2021-06-10 DIAGNOSIS — N186 End stage renal disease: Secondary | ICD-10-CM | POA: Diagnosis not present

## 2021-06-11 DIAGNOSIS — N2581 Secondary hyperparathyroidism of renal origin: Secondary | ICD-10-CM | POA: Diagnosis not present

## 2021-06-11 DIAGNOSIS — Z992 Dependence on renal dialysis: Secondary | ICD-10-CM | POA: Diagnosis not present

## 2021-06-11 DIAGNOSIS — N186 End stage renal disease: Secondary | ICD-10-CM | POA: Diagnosis not present

## 2021-06-12 ENCOUNTER — Other Ambulatory Visit: Payer: Self-pay

## 2021-06-12 ENCOUNTER — Encounter (HOSPITAL_COMMUNITY): Payer: Self-pay

## 2021-06-12 ENCOUNTER — Ambulatory Visit (HOSPITAL_COMMUNITY)
Admission: RE | Admit: 2021-06-12 | Discharge: 2021-06-12 | Disposition: A | Discharge status: Home / Self Care | Payer: Medicare HMO | Source: Ambulatory Visit | Attending: Orthopedic Surgery | Admitting: Orthopedic Surgery

## 2021-06-12 DIAGNOSIS — S46011A Strain of muscle(s) and tendon(s) of the rotator cuff of right shoulder, initial encounter: Secondary | ICD-10-CM | POA: Diagnosis not present

## 2021-06-12 DIAGNOSIS — M19011 Primary osteoarthritis, right shoulder: Secondary | ICD-10-CM | POA: Diagnosis not present

## 2021-06-12 IMAGING — RF DG FLUORO GUIDE NDL PLC/BX
1 series · 1 of 1 positions shown · non-contrast
Comparison: none

CLINICAL DATA: Arthritis, chronic RIGHT shoulder pain, traumatic
incomplete tear of RIGHT rotator cuff

EXAM:
RIGHT SHOULDER INJECTION UNDER FLUOROSCOPY
TECHNIQUE: Procedure, risks, benefits and alternatives explained to the
patient.

[Series 1: cp_standard · 0.18mm/px · 1 of 1 slices shown]
[im 1/1]
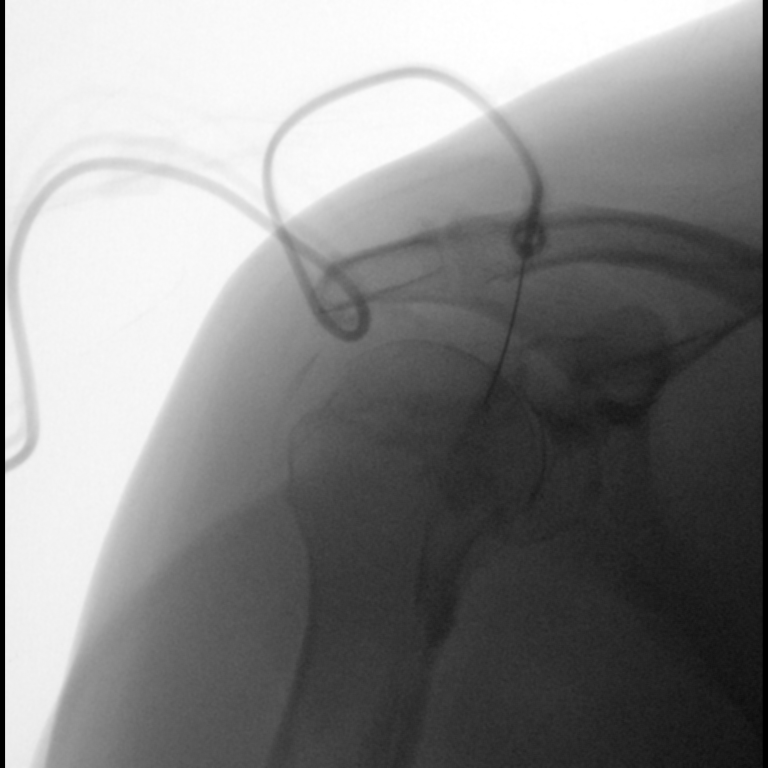

[1 of 1 positions shown; findings below may reference images not displayed]

Patient's questions answered.

Written informed consent obtained.

Timeout protocol followed.

RIGHT shoulder joint localized by fluoroscopy.

Skin prepped and draped in usual sterile fashion.

Skin and soft tissues anesthetized with 4 mL of 1% lidocaine.

Under fluoroscopic guidance, 22 gauge spinal needle was advanced
into RIGHT shoulder joint.

Intra-articular position of needle tip confirmed with 3 mL of
Omnipaque 180.

Patient then injected with requested 40 mg of Depo-Medrol and 3 mL
of Sensorcaine 0.5%.

Procedure tolerated well by patient without immediate complication.

Patient reported symptomatic improvement following the procedure.

FLUOROSCOPY:
Radiation Exposure Index (as provided by the fluoroscopic device):
2.9 mGy Kerma
FINDINGS: As above
IMPRESSION: Technically successful therapeutic RIGHT shoulder injection.

## 2021-06-12 MED ORDER — IOHEXOL 180 MG/ML  SOLN
20.0000 mL | Freq: Once | INTRAMUSCULAR | Status: AC | PRN
  Administered 2021-06-12: 3 mL

## 2021-06-12 MED ORDER — LIDOCAINE HCL (PF) 1 % IJ SOLN
INTRAMUSCULAR | Status: AC
  Administered 2021-06-12: 5 mL
  Filled 2021-06-12: qty 5

## 2021-06-12 MED ORDER — POVIDONE-IODINE 10 % EX SOLN
CUTANEOUS | Status: AC
  Administered 2021-06-12: 1
  Filled 2021-06-12: qty 14.8

## 2021-06-12 MED ORDER — BUPIVACAINE HCL (PF) 0.5 % IJ SOLN
INTRAMUSCULAR | Status: AC
  Administered 2021-06-12: 5 mL
  Filled 2021-06-12: qty 30

## 2021-06-12 MED ORDER — METHYLPREDNISOLONE ACETATE 40 MG/ML IJ SUSP
INTRAMUSCULAR | Status: AC
  Administered 2021-06-12: 40 mg
  Filled 2021-06-12: qty 1

## 2021-06-12 NOTE — Procedures (Signed)
Preprocedure Dx: Chronic right shoulder pain ?Postprocedure Dx: Chronic right shoulder pain ?Procedure  Fluoroscopically guided  therapeutic RT joint injection ?Radiologist:  Thornton Papas ?Anesthesia:  4 ml of 1% lidocaine ?Injectate:  40mg  depomedrol, 3 ml sensorcaine 0.5% ?Fluoro time:  0 minutes 48 seconds ?EBL:   None ?Complications: None  ?

## 2021-06-13 DIAGNOSIS — N186 End stage renal disease: Secondary | ICD-10-CM | POA: Diagnosis not present

## 2021-06-13 DIAGNOSIS — N2581 Secondary hyperparathyroidism of renal origin: Secondary | ICD-10-CM | POA: Diagnosis not present

## 2021-06-13 DIAGNOSIS — Z992 Dependence on renal dialysis: Secondary | ICD-10-CM | POA: Diagnosis not present

## 2021-06-16 DIAGNOSIS — N2581 Secondary hyperparathyroidism of renal origin: Secondary | ICD-10-CM | POA: Diagnosis not present

## 2021-06-16 DIAGNOSIS — N186 End stage renal disease: Secondary | ICD-10-CM | POA: Diagnosis not present

## 2021-06-16 DIAGNOSIS — Z992 Dependence on renal dialysis: Secondary | ICD-10-CM | POA: Diagnosis not present

## 2021-06-18 DIAGNOSIS — Z992 Dependence on renal dialysis: Secondary | ICD-10-CM | POA: Diagnosis not present

## 2021-06-18 DIAGNOSIS — N186 End stage renal disease: Secondary | ICD-10-CM | POA: Diagnosis not present

## 2021-06-18 DIAGNOSIS — N2581 Secondary hyperparathyroidism of renal origin: Secondary | ICD-10-CM | POA: Diagnosis not present

## 2021-06-20 DIAGNOSIS — N2581 Secondary hyperparathyroidism of renal origin: Secondary | ICD-10-CM | POA: Diagnosis not present

## 2021-06-20 DIAGNOSIS — Z992 Dependence on renal dialysis: Secondary | ICD-10-CM | POA: Diagnosis not present

## 2021-06-20 DIAGNOSIS — N186 End stage renal disease: Secondary | ICD-10-CM | POA: Diagnosis not present

## 2021-06-23 DIAGNOSIS — Z992 Dependence on renal dialysis: Secondary | ICD-10-CM | POA: Diagnosis not present

## 2021-06-23 DIAGNOSIS — N186 End stage renal disease: Secondary | ICD-10-CM | POA: Diagnosis not present

## 2021-06-23 DIAGNOSIS — N2581 Secondary hyperparathyroidism of renal origin: Secondary | ICD-10-CM | POA: Diagnosis not present

## 2021-06-25 DIAGNOSIS — Z992 Dependence on renal dialysis: Secondary | ICD-10-CM | POA: Diagnosis not present

## 2021-06-25 DIAGNOSIS — N186 End stage renal disease: Secondary | ICD-10-CM | POA: Diagnosis not present

## 2021-06-25 DIAGNOSIS — N2581 Secondary hyperparathyroidism of renal origin: Secondary | ICD-10-CM | POA: Diagnosis not present

## 2021-06-27 DIAGNOSIS — Z992 Dependence on renal dialysis: Secondary | ICD-10-CM | POA: Diagnosis not present

## 2021-06-27 DIAGNOSIS — N186 End stage renal disease: Secondary | ICD-10-CM | POA: Diagnosis not present

## 2021-06-27 DIAGNOSIS — N2581 Secondary hyperparathyroidism of renal origin: Secondary | ICD-10-CM | POA: Diagnosis not present

## 2021-06-30 DIAGNOSIS — N2581 Secondary hyperparathyroidism of renal origin: Secondary | ICD-10-CM | POA: Diagnosis not present

## 2021-06-30 DIAGNOSIS — Z992 Dependence on renal dialysis: Secondary | ICD-10-CM | POA: Diagnosis not present

## 2021-06-30 DIAGNOSIS — N186 End stage renal disease: Secondary | ICD-10-CM | POA: Diagnosis not present

## 2021-07-02 DIAGNOSIS — N2581 Secondary hyperparathyroidism of renal origin: Secondary | ICD-10-CM | POA: Diagnosis not present

## 2021-07-02 DIAGNOSIS — Z992 Dependence on renal dialysis: Secondary | ICD-10-CM | POA: Diagnosis not present

## 2021-07-02 DIAGNOSIS — N186 End stage renal disease: Secondary | ICD-10-CM | POA: Diagnosis not present

## 2021-07-04 DIAGNOSIS — Z992 Dependence on renal dialysis: Secondary | ICD-10-CM | POA: Diagnosis not present

## 2021-07-04 DIAGNOSIS — N2581 Secondary hyperparathyroidism of renal origin: Secondary | ICD-10-CM | POA: Diagnosis not present

## 2021-07-04 DIAGNOSIS — N186 End stage renal disease: Secondary | ICD-10-CM | POA: Diagnosis not present

## 2021-07-07 DIAGNOSIS — N186 End stage renal disease: Secondary | ICD-10-CM | POA: Diagnosis not present

## 2021-07-07 DIAGNOSIS — Z992 Dependence on renal dialysis: Secondary | ICD-10-CM | POA: Diagnosis not present

## 2021-07-07 DIAGNOSIS — N2581 Secondary hyperparathyroidism of renal origin: Secondary | ICD-10-CM | POA: Diagnosis not present

## 2021-07-09 DIAGNOSIS — N2581 Secondary hyperparathyroidism of renal origin: Secondary | ICD-10-CM | POA: Diagnosis not present

## 2021-07-09 DIAGNOSIS — Z992 Dependence on renal dialysis: Secondary | ICD-10-CM | POA: Diagnosis not present

## 2021-07-09 DIAGNOSIS — N186 End stage renal disease: Secondary | ICD-10-CM | POA: Diagnosis not present

## 2021-07-11 DIAGNOSIS — Z992 Dependence on renal dialysis: Secondary | ICD-10-CM | POA: Diagnosis not present

## 2021-07-11 DIAGNOSIS — N2581 Secondary hyperparathyroidism of renal origin: Secondary | ICD-10-CM | POA: Diagnosis not present

## 2021-07-11 DIAGNOSIS — N186 End stage renal disease: Secondary | ICD-10-CM | POA: Diagnosis not present

## 2021-07-14 DIAGNOSIS — Z992 Dependence on renal dialysis: Secondary | ICD-10-CM | POA: Diagnosis not present

## 2021-07-14 DIAGNOSIS — N186 End stage renal disease: Secondary | ICD-10-CM | POA: Diagnosis not present

## 2021-07-14 DIAGNOSIS — N2581 Secondary hyperparathyroidism of renal origin: Secondary | ICD-10-CM | POA: Diagnosis not present

## 2021-07-16 DIAGNOSIS — N2581 Secondary hyperparathyroidism of renal origin: Secondary | ICD-10-CM | POA: Diagnosis not present

## 2021-07-16 DIAGNOSIS — N186 End stage renal disease: Secondary | ICD-10-CM | POA: Diagnosis not present

## 2021-07-16 DIAGNOSIS — Z992 Dependence on renal dialysis: Secondary | ICD-10-CM | POA: Diagnosis not present

## 2021-07-18 DIAGNOSIS — N2581 Secondary hyperparathyroidism of renal origin: Secondary | ICD-10-CM | POA: Diagnosis not present

## 2021-07-18 DIAGNOSIS — N186 End stage renal disease: Secondary | ICD-10-CM | POA: Diagnosis not present

## 2021-07-18 DIAGNOSIS — Z992 Dependence on renal dialysis: Secondary | ICD-10-CM | POA: Diagnosis not present

## 2021-07-21 DIAGNOSIS — Z992 Dependence on renal dialysis: Secondary | ICD-10-CM | POA: Diagnosis not present

## 2021-07-21 DIAGNOSIS — N2581 Secondary hyperparathyroidism of renal origin: Secondary | ICD-10-CM | POA: Diagnosis not present

## 2021-07-21 DIAGNOSIS — N186 End stage renal disease: Secondary | ICD-10-CM | POA: Diagnosis not present

## 2021-07-23 DIAGNOSIS — E119 Type 2 diabetes mellitus without complications: Secondary | ICD-10-CM | POA: Diagnosis not present

## 2021-07-23 DIAGNOSIS — N2581 Secondary hyperparathyroidism of renal origin: Secondary | ICD-10-CM | POA: Diagnosis not present

## 2021-07-23 DIAGNOSIS — I10 Essential (primary) hypertension: Secondary | ICD-10-CM | POA: Diagnosis not present

## 2021-07-23 DIAGNOSIS — Z992 Dependence on renal dialysis: Secondary | ICD-10-CM | POA: Diagnosis not present

## 2021-07-23 DIAGNOSIS — N186 End stage renal disease: Secondary | ICD-10-CM | POA: Diagnosis not present

## 2021-07-25 DIAGNOSIS — Z992 Dependence on renal dialysis: Secondary | ICD-10-CM | POA: Diagnosis not present

## 2021-07-25 DIAGNOSIS — N2581 Secondary hyperparathyroidism of renal origin: Secondary | ICD-10-CM | POA: Diagnosis not present

## 2021-07-25 DIAGNOSIS — N186 End stage renal disease: Secondary | ICD-10-CM | POA: Diagnosis not present

## 2021-07-28 DIAGNOSIS — N2581 Secondary hyperparathyroidism of renal origin: Secondary | ICD-10-CM | POA: Diagnosis not present

## 2021-07-28 DIAGNOSIS — Z992 Dependence on renal dialysis: Secondary | ICD-10-CM | POA: Diagnosis not present

## 2021-07-28 DIAGNOSIS — N186 End stage renal disease: Secondary | ICD-10-CM | POA: Diagnosis not present

## 2021-07-29 DIAGNOSIS — E1122 Type 2 diabetes mellitus with diabetic chronic kidney disease: Secondary | ICD-10-CM | POA: Diagnosis not present

## 2021-07-29 DIAGNOSIS — D649 Anemia, unspecified: Secondary | ICD-10-CM | POA: Diagnosis not present

## 2021-07-29 DIAGNOSIS — Z992 Dependence on renal dialysis: Secondary | ICD-10-CM | POA: Diagnosis not present

## 2021-07-29 DIAGNOSIS — I10 Essential (primary) hypertension: Secondary | ICD-10-CM | POA: Diagnosis not present

## 2021-07-29 DIAGNOSIS — E785 Hyperlipidemia, unspecified: Secondary | ICD-10-CM | POA: Diagnosis not present

## 2021-07-29 DIAGNOSIS — N184 Chronic kidney disease, stage 4 (severe): Secondary | ICD-10-CM | POA: Diagnosis not present

## 2021-07-30 DIAGNOSIS — N186 End stage renal disease: Secondary | ICD-10-CM | POA: Diagnosis not present

## 2021-07-30 DIAGNOSIS — N2581 Secondary hyperparathyroidism of renal origin: Secondary | ICD-10-CM | POA: Diagnosis not present

## 2021-07-30 DIAGNOSIS — Z992 Dependence on renal dialysis: Secondary | ICD-10-CM | POA: Diagnosis not present

## 2021-08-01 DIAGNOSIS — N2581 Secondary hyperparathyroidism of renal origin: Secondary | ICD-10-CM | POA: Diagnosis not present

## 2021-08-01 DIAGNOSIS — N186 End stage renal disease: Secondary | ICD-10-CM | POA: Diagnosis not present

## 2021-08-01 DIAGNOSIS — Z992 Dependence on renal dialysis: Secondary | ICD-10-CM | POA: Diagnosis not present

## 2021-08-04 DIAGNOSIS — N2581 Secondary hyperparathyroidism of renal origin: Secondary | ICD-10-CM | POA: Diagnosis not present

## 2021-08-04 DIAGNOSIS — Z794 Long term (current) use of insulin: Secondary | ICD-10-CM | POA: Diagnosis not present

## 2021-08-04 DIAGNOSIS — Z992 Dependence on renal dialysis: Secondary | ICD-10-CM | POA: Diagnosis not present

## 2021-08-04 DIAGNOSIS — N186 End stage renal disease: Secondary | ICD-10-CM | POA: Diagnosis not present

## 2021-08-04 DIAGNOSIS — E1122 Type 2 diabetes mellitus with diabetic chronic kidney disease: Secondary | ICD-10-CM | POA: Diagnosis not present

## 2021-08-06 DIAGNOSIS — Z992 Dependence on renal dialysis: Secondary | ICD-10-CM | POA: Diagnosis not present

## 2021-08-06 DIAGNOSIS — N2581 Secondary hyperparathyroidism of renal origin: Secondary | ICD-10-CM | POA: Diagnosis not present

## 2021-08-06 DIAGNOSIS — N186 End stage renal disease: Secondary | ICD-10-CM | POA: Diagnosis not present

## 2021-08-08 DIAGNOSIS — Z992 Dependence on renal dialysis: Secondary | ICD-10-CM | POA: Diagnosis not present

## 2021-08-08 DIAGNOSIS — N186 End stage renal disease: Secondary | ICD-10-CM | POA: Diagnosis not present

## 2021-08-08 DIAGNOSIS — N2581 Secondary hyperparathyroidism of renal origin: Secondary | ICD-10-CM | POA: Diagnosis not present

## 2021-08-10 DIAGNOSIS — Z992 Dependence on renal dialysis: Secondary | ICD-10-CM | POA: Diagnosis not present

## 2021-08-10 DIAGNOSIS — N186 End stage renal disease: Secondary | ICD-10-CM | POA: Diagnosis not present

## 2021-08-11 DIAGNOSIS — N186 End stage renal disease: Secondary | ICD-10-CM | POA: Diagnosis not present

## 2021-08-11 DIAGNOSIS — Z992 Dependence on renal dialysis: Secondary | ICD-10-CM | POA: Diagnosis not present

## 2021-08-11 DIAGNOSIS — N2581 Secondary hyperparathyroidism of renal origin: Secondary | ICD-10-CM | POA: Diagnosis not present

## 2021-08-13 DIAGNOSIS — N2581 Secondary hyperparathyroidism of renal origin: Secondary | ICD-10-CM | POA: Diagnosis not present

## 2021-08-13 DIAGNOSIS — Z992 Dependence on renal dialysis: Secondary | ICD-10-CM | POA: Diagnosis not present

## 2021-08-13 DIAGNOSIS — N186 End stage renal disease: Secondary | ICD-10-CM | POA: Diagnosis not present

## 2021-08-15 DIAGNOSIS — N2581 Secondary hyperparathyroidism of renal origin: Secondary | ICD-10-CM | POA: Diagnosis not present

## 2021-08-15 DIAGNOSIS — N186 End stage renal disease: Secondary | ICD-10-CM | POA: Diagnosis not present

## 2021-08-15 DIAGNOSIS — Z992 Dependence on renal dialysis: Secondary | ICD-10-CM | POA: Diagnosis not present

## 2021-08-18 DIAGNOSIS — N186 End stage renal disease: Secondary | ICD-10-CM | POA: Diagnosis not present

## 2021-08-18 DIAGNOSIS — Z992 Dependence on renal dialysis: Secondary | ICD-10-CM | POA: Diagnosis not present

## 2021-08-18 DIAGNOSIS — N2581 Secondary hyperparathyroidism of renal origin: Secondary | ICD-10-CM | POA: Diagnosis not present

## 2021-08-20 DIAGNOSIS — N2581 Secondary hyperparathyroidism of renal origin: Secondary | ICD-10-CM | POA: Diagnosis not present

## 2021-08-20 DIAGNOSIS — N186 End stage renal disease: Secondary | ICD-10-CM | POA: Diagnosis not present

## 2021-08-20 DIAGNOSIS — Z992 Dependence on renal dialysis: Secondary | ICD-10-CM | POA: Diagnosis not present

## 2021-08-22 DIAGNOSIS — N2581 Secondary hyperparathyroidism of renal origin: Secondary | ICD-10-CM | POA: Diagnosis not present

## 2021-08-22 DIAGNOSIS — N186 End stage renal disease: Secondary | ICD-10-CM | POA: Diagnosis not present

## 2021-08-22 DIAGNOSIS — Z992 Dependence on renal dialysis: Secondary | ICD-10-CM | POA: Diagnosis not present

## 2021-08-25 DIAGNOSIS — Z992 Dependence on renal dialysis: Secondary | ICD-10-CM | POA: Diagnosis not present

## 2021-08-25 DIAGNOSIS — N186 End stage renal disease: Secondary | ICD-10-CM | POA: Diagnosis not present

## 2021-08-25 DIAGNOSIS — N2581 Secondary hyperparathyroidism of renal origin: Secondary | ICD-10-CM | POA: Diagnosis not present

## 2021-08-27 DIAGNOSIS — N2581 Secondary hyperparathyroidism of renal origin: Secondary | ICD-10-CM | POA: Diagnosis not present

## 2021-08-27 DIAGNOSIS — Z992 Dependence on renal dialysis: Secondary | ICD-10-CM | POA: Diagnosis not present

## 2021-08-27 DIAGNOSIS — N186 End stage renal disease: Secondary | ICD-10-CM | POA: Diagnosis not present

## 2021-08-29 DIAGNOSIS — N186 End stage renal disease: Secondary | ICD-10-CM | POA: Diagnosis not present

## 2021-08-29 DIAGNOSIS — Z992 Dependence on renal dialysis: Secondary | ICD-10-CM | POA: Diagnosis not present

## 2021-08-29 DIAGNOSIS — N2581 Secondary hyperparathyroidism of renal origin: Secondary | ICD-10-CM | POA: Diagnosis not present

## 2021-09-01 DIAGNOSIS — N186 End stage renal disease: Secondary | ICD-10-CM | POA: Diagnosis not present

## 2021-09-01 DIAGNOSIS — N2581 Secondary hyperparathyroidism of renal origin: Secondary | ICD-10-CM | POA: Diagnosis not present

## 2021-09-01 DIAGNOSIS — Z992 Dependence on renal dialysis: Secondary | ICD-10-CM | POA: Diagnosis not present

## 2021-09-03 DIAGNOSIS — Z992 Dependence on renal dialysis: Secondary | ICD-10-CM | POA: Diagnosis not present

## 2021-09-03 DIAGNOSIS — N186 End stage renal disease: Secondary | ICD-10-CM | POA: Diagnosis not present

## 2021-09-03 DIAGNOSIS — N2581 Secondary hyperparathyroidism of renal origin: Secondary | ICD-10-CM | POA: Diagnosis not present

## 2021-09-05 DIAGNOSIS — Z992 Dependence on renal dialysis: Secondary | ICD-10-CM | POA: Diagnosis not present

## 2021-09-05 DIAGNOSIS — N186 End stage renal disease: Secondary | ICD-10-CM | POA: Diagnosis not present

## 2021-09-05 DIAGNOSIS — N2581 Secondary hyperparathyroidism of renal origin: Secondary | ICD-10-CM | POA: Diagnosis not present

## 2021-09-08 DIAGNOSIS — Z992 Dependence on renal dialysis: Secondary | ICD-10-CM | POA: Diagnosis not present

## 2021-09-08 DIAGNOSIS — N186 End stage renal disease: Secondary | ICD-10-CM | POA: Diagnosis not present

## 2021-09-08 DIAGNOSIS — N2581 Secondary hyperparathyroidism of renal origin: Secondary | ICD-10-CM | POA: Diagnosis not present

## 2021-09-10 DIAGNOSIS — N186 End stage renal disease: Secondary | ICD-10-CM | POA: Diagnosis not present

## 2021-09-10 DIAGNOSIS — Z992 Dependence on renal dialysis: Secondary | ICD-10-CM | POA: Diagnosis not present

## 2021-09-10 DIAGNOSIS — N2581 Secondary hyperparathyroidism of renal origin: Secondary | ICD-10-CM | POA: Diagnosis not present

## 2021-09-12 DIAGNOSIS — N2581 Secondary hyperparathyroidism of renal origin: Secondary | ICD-10-CM | POA: Diagnosis not present

## 2021-09-12 DIAGNOSIS — Z992 Dependence on renal dialysis: Secondary | ICD-10-CM | POA: Diagnosis not present

## 2021-09-12 DIAGNOSIS — N186 End stage renal disease: Secondary | ICD-10-CM | POA: Diagnosis not present

## 2021-09-12 IMAGING — CR DG KNEE COMPLETE 4+V*R*
4 series · 4 of 4 positions shown · non-contrast
Comparison: None.

CLINICAL DATA: Knee pain after fall

EXAM:
RIGHT KNEE - COMPLETE 4+ VIEW

[knee ap]
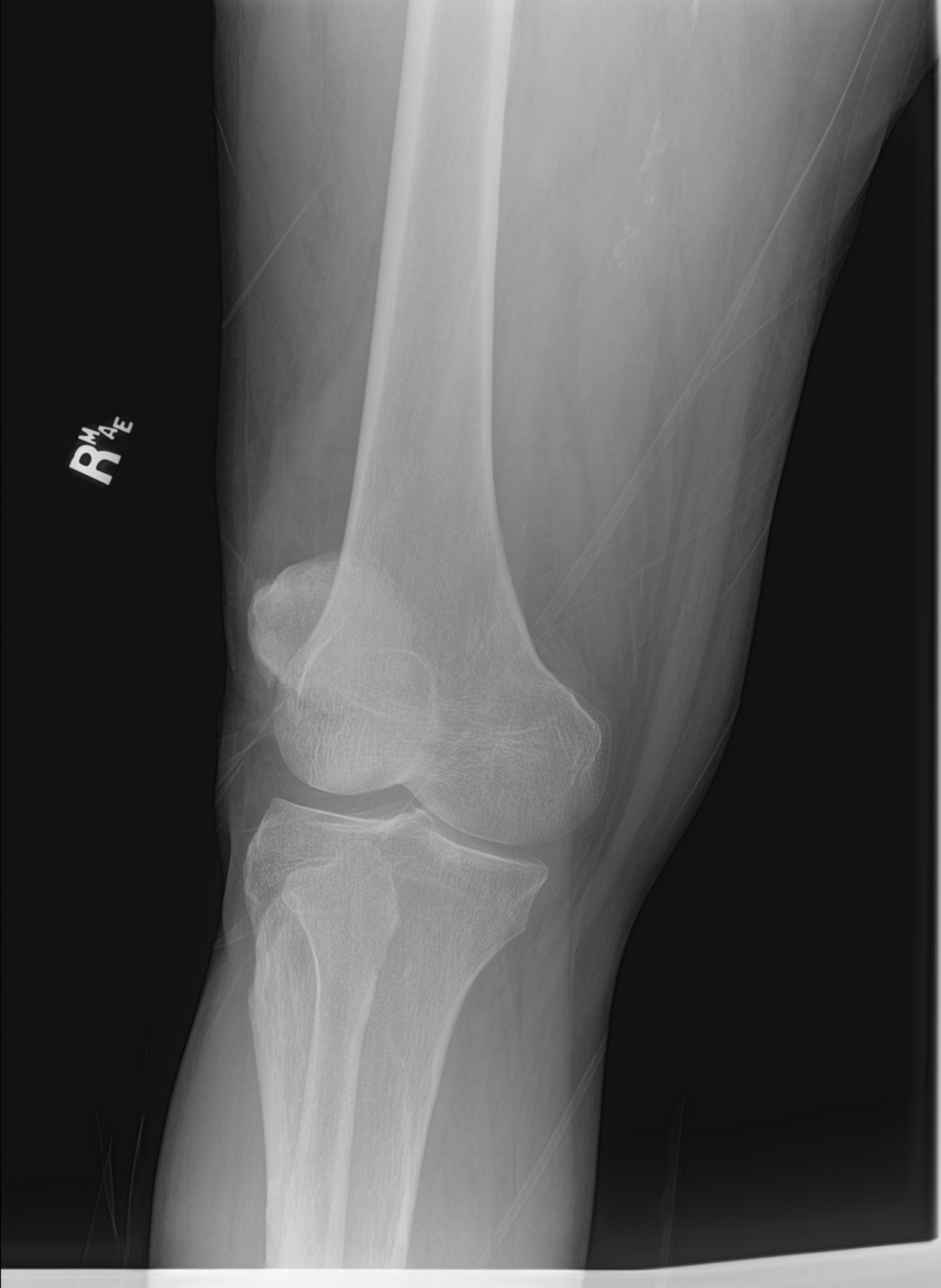

[knee lat]
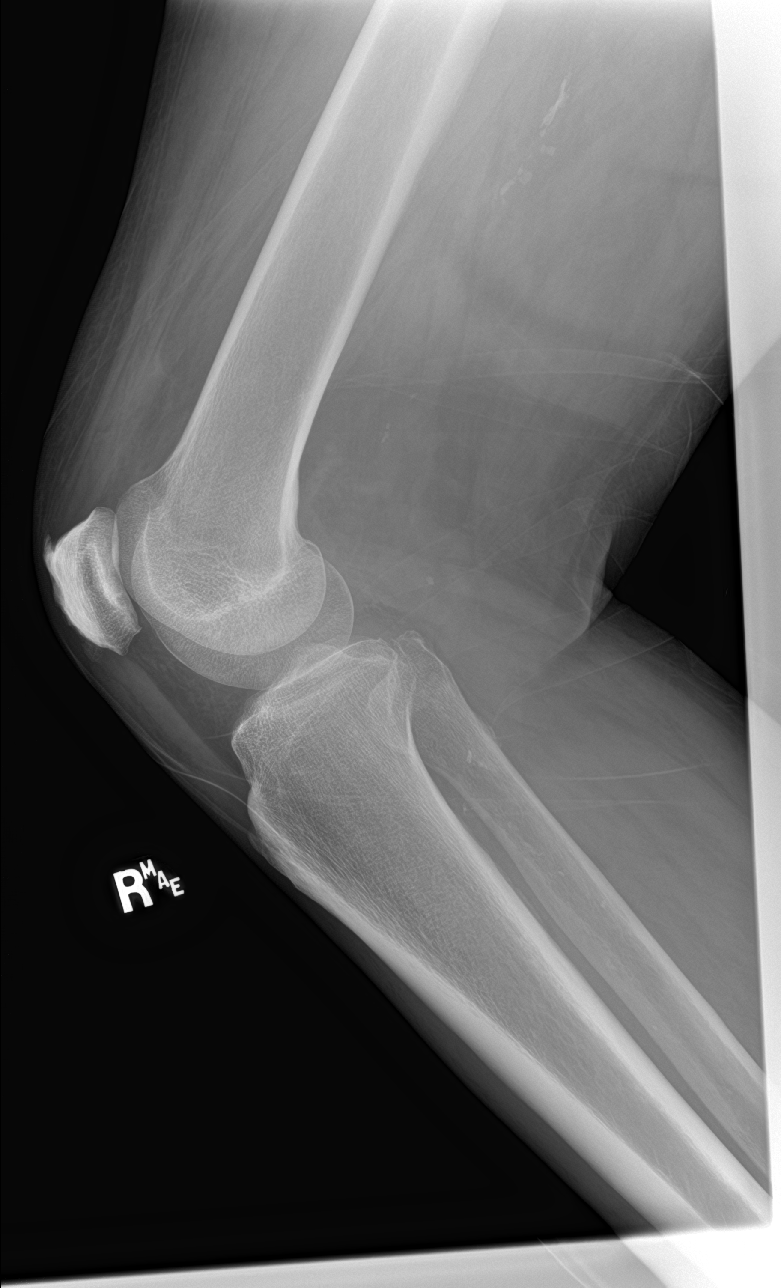

[knee obl (1 of 2)]
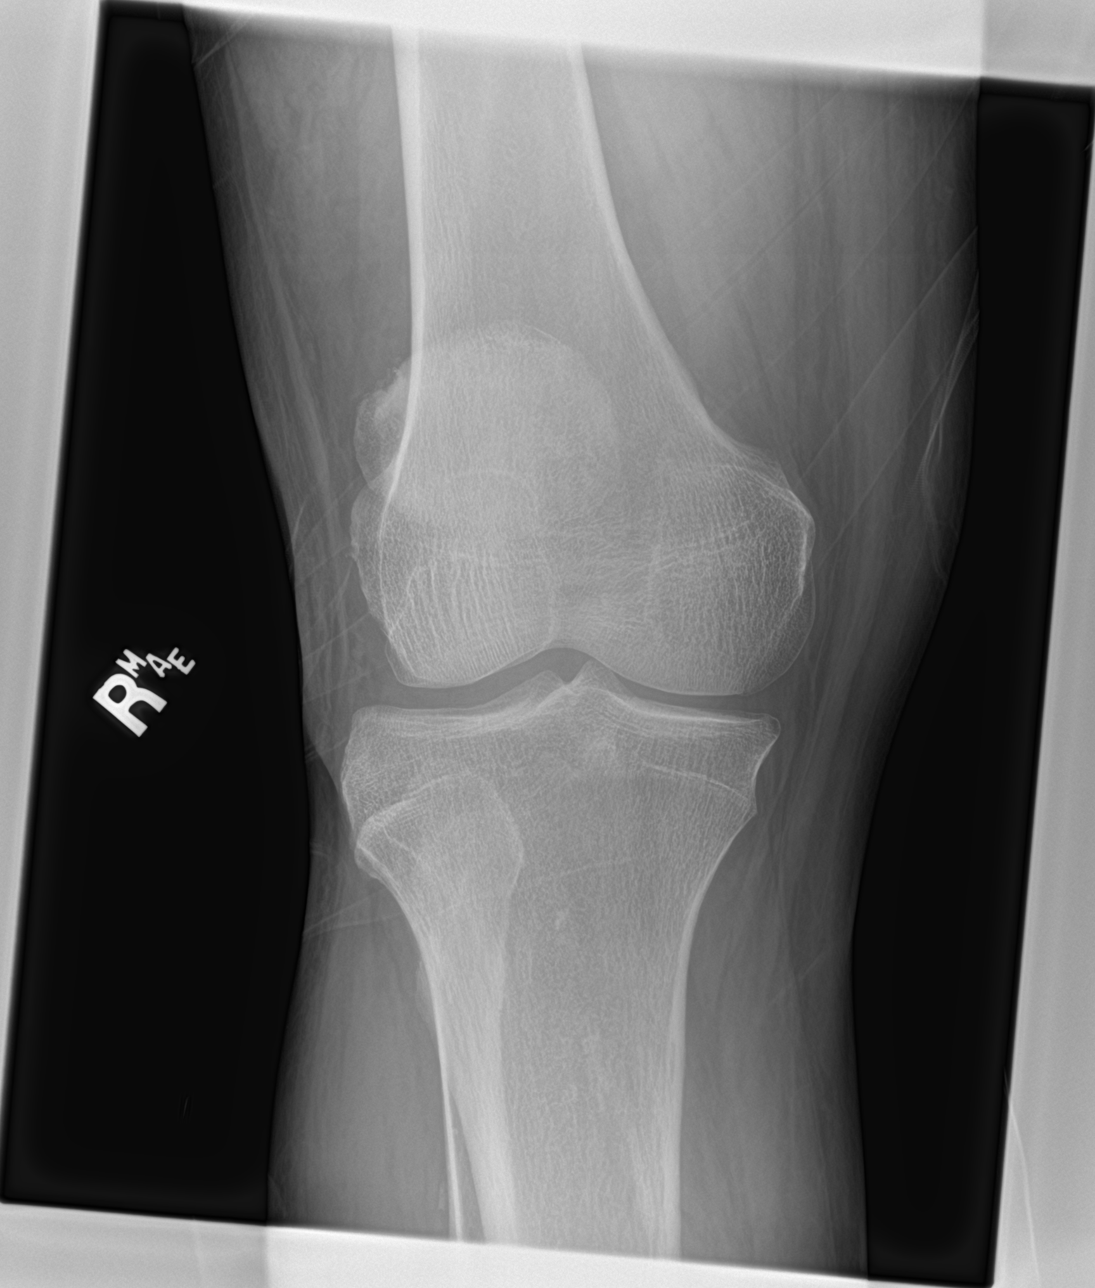

[knee obl (2 of 2)]
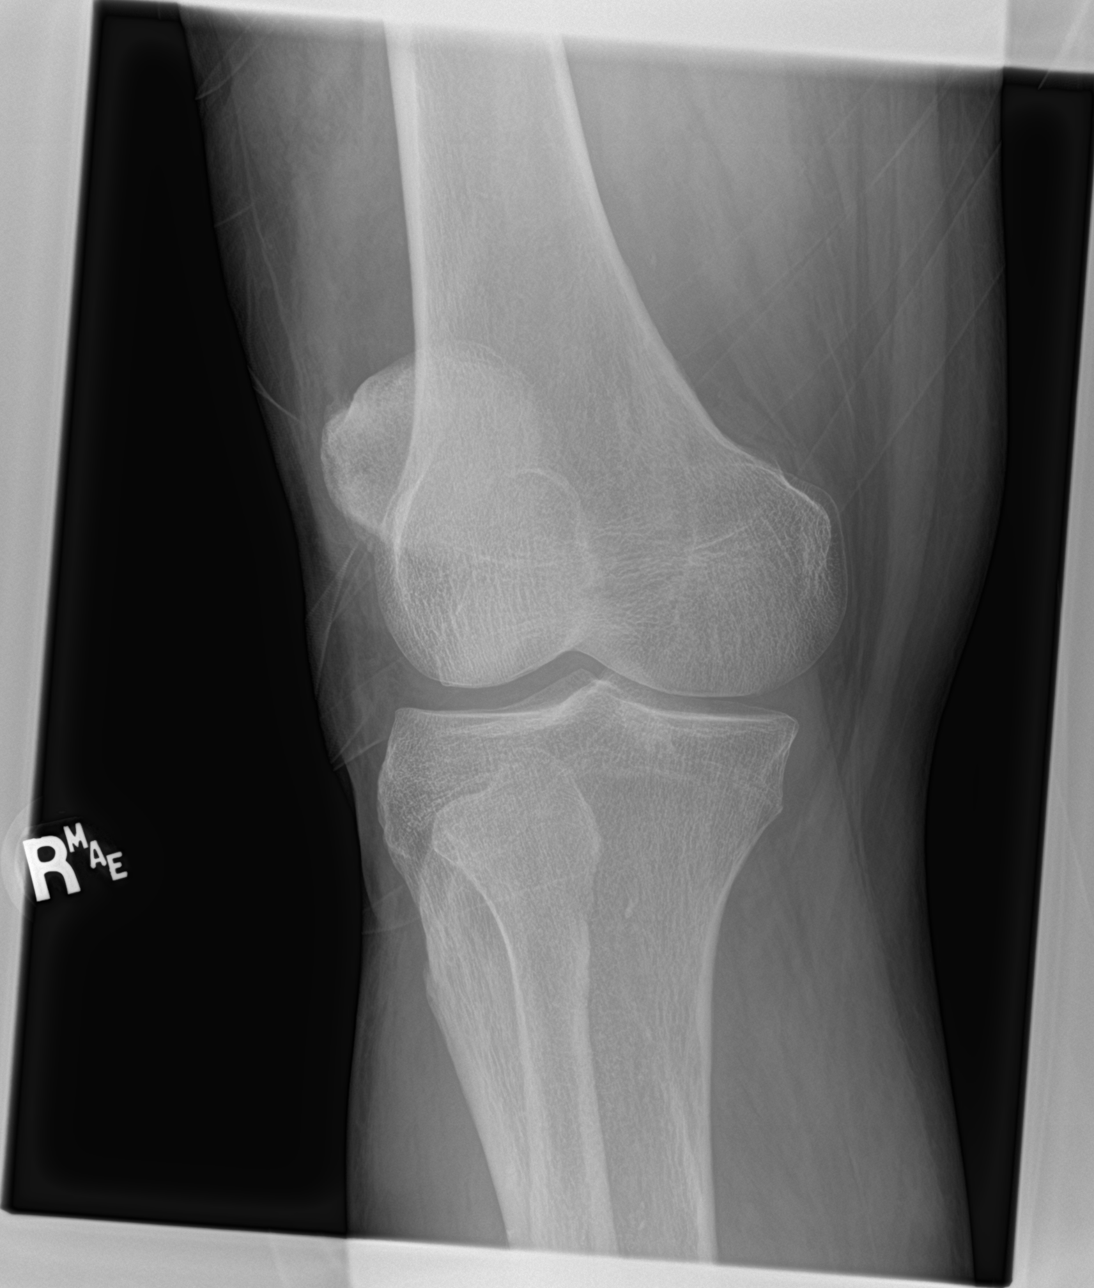

[4 of 4 positions shown; findings below may reference images not displayed]

FINDINGS: No evidence of fracture, dislocation, or joint effusion. No evidence
of significant arthropathy. Superior patellar enthesophyte. Vascular
calcifications.
IMPRESSION: No acute osseous abnormality.

## 2021-09-15 DIAGNOSIS — N186 End stage renal disease: Secondary | ICD-10-CM | POA: Diagnosis not present

## 2021-09-15 DIAGNOSIS — N2581 Secondary hyperparathyroidism of renal origin: Secondary | ICD-10-CM | POA: Diagnosis not present

## 2021-09-15 DIAGNOSIS — Z992 Dependence on renal dialysis: Secondary | ICD-10-CM | POA: Diagnosis not present

## 2021-09-17 DIAGNOSIS — N2581 Secondary hyperparathyroidism of renal origin: Secondary | ICD-10-CM | POA: Diagnosis not present

## 2021-09-17 DIAGNOSIS — Z992 Dependence on renal dialysis: Secondary | ICD-10-CM | POA: Diagnosis not present

## 2021-09-17 DIAGNOSIS — N186 End stage renal disease: Secondary | ICD-10-CM | POA: Diagnosis not present

## 2021-09-19 DIAGNOSIS — Z992 Dependence on renal dialysis: Secondary | ICD-10-CM | POA: Diagnosis not present

## 2021-09-19 DIAGNOSIS — N186 End stage renal disease: Secondary | ICD-10-CM | POA: Diagnosis not present

## 2021-09-19 DIAGNOSIS — N2581 Secondary hyperparathyroidism of renal origin: Secondary | ICD-10-CM | POA: Diagnosis not present

## 2021-09-22 DIAGNOSIS — N2581 Secondary hyperparathyroidism of renal origin: Secondary | ICD-10-CM | POA: Diagnosis not present

## 2021-09-22 DIAGNOSIS — N186 End stage renal disease: Secondary | ICD-10-CM | POA: Diagnosis not present

## 2021-09-22 DIAGNOSIS — Z992 Dependence on renal dialysis: Secondary | ICD-10-CM | POA: Diagnosis not present

## 2021-09-24 DIAGNOSIS — N2581 Secondary hyperparathyroidism of renal origin: Secondary | ICD-10-CM | POA: Diagnosis not present

## 2021-09-24 DIAGNOSIS — Z992 Dependence on renal dialysis: Secondary | ICD-10-CM | POA: Diagnosis not present

## 2021-09-24 DIAGNOSIS — N186 End stage renal disease: Secondary | ICD-10-CM | POA: Diagnosis not present

## 2021-09-26 DIAGNOSIS — N2581 Secondary hyperparathyroidism of renal origin: Secondary | ICD-10-CM | POA: Diagnosis not present

## 2021-09-26 DIAGNOSIS — N186 End stage renal disease: Secondary | ICD-10-CM | POA: Diagnosis not present

## 2021-09-26 DIAGNOSIS — Z992 Dependence on renal dialysis: Secondary | ICD-10-CM | POA: Diagnosis not present

## 2021-09-29 DIAGNOSIS — Z992 Dependence on renal dialysis: Secondary | ICD-10-CM | POA: Diagnosis not present

## 2021-09-29 DIAGNOSIS — N186 End stage renal disease: Secondary | ICD-10-CM | POA: Diagnosis not present

## 2021-09-29 DIAGNOSIS — N2581 Secondary hyperparathyroidism of renal origin: Secondary | ICD-10-CM | POA: Diagnosis not present

## 2021-10-01 DIAGNOSIS — Z992 Dependence on renal dialysis: Secondary | ICD-10-CM | POA: Diagnosis not present

## 2021-10-01 DIAGNOSIS — N186 End stage renal disease: Secondary | ICD-10-CM | POA: Diagnosis not present

## 2021-10-01 DIAGNOSIS — N2581 Secondary hyperparathyroidism of renal origin: Secondary | ICD-10-CM | POA: Diagnosis not present

## 2021-10-03 DIAGNOSIS — N186 End stage renal disease: Secondary | ICD-10-CM | POA: Diagnosis not present

## 2021-10-03 DIAGNOSIS — N2581 Secondary hyperparathyroidism of renal origin: Secondary | ICD-10-CM | POA: Diagnosis not present

## 2021-10-03 DIAGNOSIS — Z992 Dependence on renal dialysis: Secondary | ICD-10-CM | POA: Diagnosis not present

## 2021-10-06 DIAGNOSIS — N186 End stage renal disease: Secondary | ICD-10-CM | POA: Diagnosis not present

## 2021-10-06 DIAGNOSIS — Z992 Dependence on renal dialysis: Secondary | ICD-10-CM | POA: Diagnosis not present

## 2021-10-06 DIAGNOSIS — N2581 Secondary hyperparathyroidism of renal origin: Secondary | ICD-10-CM | POA: Diagnosis not present

## 2021-10-08 DIAGNOSIS — Z992 Dependence on renal dialysis: Secondary | ICD-10-CM | POA: Diagnosis not present

## 2021-10-08 DIAGNOSIS — N186 End stage renal disease: Secondary | ICD-10-CM | POA: Diagnosis not present

## 2021-10-08 DIAGNOSIS — N2581 Secondary hyperparathyroidism of renal origin: Secondary | ICD-10-CM | POA: Diagnosis not present

## 2021-10-10 DIAGNOSIS — Z992 Dependence on renal dialysis: Secondary | ICD-10-CM | POA: Diagnosis not present

## 2021-10-10 DIAGNOSIS — N2581 Secondary hyperparathyroidism of renal origin: Secondary | ICD-10-CM | POA: Diagnosis not present

## 2021-10-10 DIAGNOSIS — N186 End stage renal disease: Secondary | ICD-10-CM | POA: Diagnosis not present

## 2021-10-13 DIAGNOSIS — Z992 Dependence on renal dialysis: Secondary | ICD-10-CM | POA: Diagnosis not present

## 2021-10-13 DIAGNOSIS — N186 End stage renal disease: Secondary | ICD-10-CM | POA: Diagnosis not present

## 2021-10-13 DIAGNOSIS — N2581 Secondary hyperparathyroidism of renal origin: Secondary | ICD-10-CM | POA: Diagnosis not present

## 2021-10-15 DIAGNOSIS — Z992 Dependence on renal dialysis: Secondary | ICD-10-CM | POA: Diagnosis not present

## 2021-10-15 DIAGNOSIS — N186 End stage renal disease: Secondary | ICD-10-CM | POA: Diagnosis not present

## 2021-10-15 DIAGNOSIS — N2581 Secondary hyperparathyroidism of renal origin: Secondary | ICD-10-CM | POA: Diagnosis not present

## 2021-10-17 DIAGNOSIS — N186 End stage renal disease: Secondary | ICD-10-CM | POA: Diagnosis not present

## 2021-10-17 DIAGNOSIS — N2581 Secondary hyperparathyroidism of renal origin: Secondary | ICD-10-CM | POA: Diagnosis not present

## 2021-10-17 DIAGNOSIS — Z992 Dependence on renal dialysis: Secondary | ICD-10-CM | POA: Diagnosis not present

## 2021-10-20 DIAGNOSIS — N186 End stage renal disease: Secondary | ICD-10-CM | POA: Diagnosis not present

## 2021-10-20 DIAGNOSIS — Z992 Dependence on renal dialysis: Secondary | ICD-10-CM | POA: Diagnosis not present

## 2021-10-20 DIAGNOSIS — N2581 Secondary hyperparathyroidism of renal origin: Secondary | ICD-10-CM | POA: Diagnosis not present

## 2021-10-22 DIAGNOSIS — Z992 Dependence on renal dialysis: Secondary | ICD-10-CM | POA: Diagnosis not present

## 2021-10-22 DIAGNOSIS — N2581 Secondary hyperparathyroidism of renal origin: Secondary | ICD-10-CM | POA: Diagnosis not present

## 2021-10-22 DIAGNOSIS — N186 End stage renal disease: Secondary | ICD-10-CM | POA: Diagnosis not present

## 2021-10-24 DIAGNOSIS — N186 End stage renal disease: Secondary | ICD-10-CM | POA: Diagnosis not present

## 2021-10-24 DIAGNOSIS — N2581 Secondary hyperparathyroidism of renal origin: Secondary | ICD-10-CM | POA: Diagnosis not present

## 2021-10-24 DIAGNOSIS — Z992 Dependence on renal dialysis: Secondary | ICD-10-CM | POA: Diagnosis not present

## 2021-10-27 DIAGNOSIS — Z992 Dependence on renal dialysis: Secondary | ICD-10-CM | POA: Diagnosis not present

## 2021-10-27 DIAGNOSIS — N186 End stage renal disease: Secondary | ICD-10-CM | POA: Diagnosis not present

## 2021-10-27 DIAGNOSIS — N2581 Secondary hyperparathyroidism of renal origin: Secondary | ICD-10-CM | POA: Diagnosis not present

## 2021-10-29 DIAGNOSIS — N186 End stage renal disease: Secondary | ICD-10-CM | POA: Diagnosis not present

## 2021-10-29 DIAGNOSIS — Z992 Dependence on renal dialysis: Secondary | ICD-10-CM | POA: Diagnosis not present

## 2021-10-29 DIAGNOSIS — N2581 Secondary hyperparathyroidism of renal origin: Secondary | ICD-10-CM | POA: Diagnosis not present

## 2021-10-31 DIAGNOSIS — Z992 Dependence on renal dialysis: Secondary | ICD-10-CM | POA: Diagnosis not present

## 2021-10-31 DIAGNOSIS — N186 End stage renal disease: Secondary | ICD-10-CM | POA: Diagnosis not present

## 2021-10-31 DIAGNOSIS — N2581 Secondary hyperparathyroidism of renal origin: Secondary | ICD-10-CM | POA: Diagnosis not present

## 2021-11-03 DIAGNOSIS — Z992 Dependence on renal dialysis: Secondary | ICD-10-CM | POA: Diagnosis not present

## 2021-11-03 DIAGNOSIS — N186 End stage renal disease: Secondary | ICD-10-CM | POA: Diagnosis not present

## 2021-11-03 DIAGNOSIS — N2581 Secondary hyperparathyroidism of renal origin: Secondary | ICD-10-CM | POA: Diagnosis not present

## 2021-11-05 DIAGNOSIS — N186 End stage renal disease: Secondary | ICD-10-CM | POA: Diagnosis not present

## 2021-11-05 DIAGNOSIS — N2581 Secondary hyperparathyroidism of renal origin: Secondary | ICD-10-CM | POA: Diagnosis not present

## 2021-11-05 DIAGNOSIS — Z992 Dependence on renal dialysis: Secondary | ICD-10-CM | POA: Diagnosis not present

## 2021-11-07 DIAGNOSIS — Z992 Dependence on renal dialysis: Secondary | ICD-10-CM | POA: Diagnosis not present

## 2021-11-07 DIAGNOSIS — N2581 Secondary hyperparathyroidism of renal origin: Secondary | ICD-10-CM | POA: Diagnosis not present

## 2021-11-07 DIAGNOSIS — N186 End stage renal disease: Secondary | ICD-10-CM | POA: Diagnosis not present

## 2021-11-10 DIAGNOSIS — N2581 Secondary hyperparathyroidism of renal origin: Secondary | ICD-10-CM | POA: Diagnosis not present

## 2021-11-10 DIAGNOSIS — N186 End stage renal disease: Secondary | ICD-10-CM | POA: Diagnosis not present

## 2021-11-10 DIAGNOSIS — Z992 Dependence on renal dialysis: Secondary | ICD-10-CM | POA: Diagnosis not present

## 2021-11-12 DIAGNOSIS — Z794 Long term (current) use of insulin: Secondary | ICD-10-CM | POA: Diagnosis not present

## 2021-11-12 DIAGNOSIS — N2581 Secondary hyperparathyroidism of renal origin: Secondary | ICD-10-CM | POA: Diagnosis not present

## 2021-11-12 DIAGNOSIS — E1122 Type 2 diabetes mellitus with diabetic chronic kidney disease: Secondary | ICD-10-CM | POA: Diagnosis not present

## 2021-11-12 DIAGNOSIS — N186 End stage renal disease: Secondary | ICD-10-CM | POA: Diagnosis not present

## 2021-11-12 DIAGNOSIS — Z992 Dependence on renal dialysis: Secondary | ICD-10-CM | POA: Diagnosis not present

## 2021-11-14 DIAGNOSIS — N2581 Secondary hyperparathyroidism of renal origin: Secondary | ICD-10-CM | POA: Diagnosis not present

## 2021-11-14 DIAGNOSIS — N186 End stage renal disease: Secondary | ICD-10-CM | POA: Diagnosis not present

## 2021-11-14 DIAGNOSIS — Z992 Dependence on renal dialysis: Secondary | ICD-10-CM | POA: Diagnosis not present

## 2021-11-17 DIAGNOSIS — N2581 Secondary hyperparathyroidism of renal origin: Secondary | ICD-10-CM | POA: Diagnosis not present

## 2021-11-17 DIAGNOSIS — N186 End stage renal disease: Secondary | ICD-10-CM | POA: Diagnosis not present

## 2021-11-17 DIAGNOSIS — Z992 Dependence on renal dialysis: Secondary | ICD-10-CM | POA: Diagnosis not present

## 2021-11-19 DIAGNOSIS — N2581 Secondary hyperparathyroidism of renal origin: Secondary | ICD-10-CM | POA: Diagnosis not present

## 2021-11-19 DIAGNOSIS — N186 End stage renal disease: Secondary | ICD-10-CM | POA: Diagnosis not present

## 2021-11-19 DIAGNOSIS — Z992 Dependence on renal dialysis: Secondary | ICD-10-CM | POA: Diagnosis not present

## 2021-11-21 DIAGNOSIS — N2581 Secondary hyperparathyroidism of renal origin: Secondary | ICD-10-CM | POA: Diagnosis not present

## 2021-11-21 DIAGNOSIS — N186 End stage renal disease: Secondary | ICD-10-CM | POA: Diagnosis not present

## 2021-11-21 DIAGNOSIS — Z992 Dependence on renal dialysis: Secondary | ICD-10-CM | POA: Diagnosis not present

## 2021-11-24 DIAGNOSIS — Z992 Dependence on renal dialysis: Secondary | ICD-10-CM | POA: Diagnosis not present

## 2021-11-24 DIAGNOSIS — N186 End stage renal disease: Secondary | ICD-10-CM | POA: Diagnosis not present

## 2021-11-24 DIAGNOSIS — N2581 Secondary hyperparathyroidism of renal origin: Secondary | ICD-10-CM | POA: Diagnosis not present

## 2021-11-25 DIAGNOSIS — T82858A Stenosis of vascular prosthetic devices, implants and grafts, initial encounter: Secondary | ICD-10-CM | POA: Diagnosis not present

## 2021-11-25 DIAGNOSIS — Z992 Dependence on renal dialysis: Secondary | ICD-10-CM | POA: Diagnosis not present

## 2021-11-25 DIAGNOSIS — N186 End stage renal disease: Secondary | ICD-10-CM | POA: Diagnosis not present

## 2021-11-26 DIAGNOSIS — N2581 Secondary hyperparathyroidism of renal origin: Secondary | ICD-10-CM | POA: Diagnosis not present

## 2021-11-26 DIAGNOSIS — Z992 Dependence on renal dialysis: Secondary | ICD-10-CM | POA: Diagnosis not present

## 2021-11-26 DIAGNOSIS — N186 End stage renal disease: Secondary | ICD-10-CM | POA: Diagnosis not present

## 2021-11-28 DIAGNOSIS — N2581 Secondary hyperparathyroidism of renal origin: Secondary | ICD-10-CM | POA: Diagnosis not present

## 2021-11-28 DIAGNOSIS — Z992 Dependence on renal dialysis: Secondary | ICD-10-CM | POA: Diagnosis not present

## 2021-11-28 DIAGNOSIS — N186 End stage renal disease: Secondary | ICD-10-CM | POA: Diagnosis not present

## 2021-12-01 DIAGNOSIS — N2581 Secondary hyperparathyroidism of renal origin: Secondary | ICD-10-CM | POA: Diagnosis not present

## 2021-12-01 DIAGNOSIS — N186 End stage renal disease: Secondary | ICD-10-CM | POA: Diagnosis not present

## 2021-12-01 DIAGNOSIS — Z992 Dependence on renal dialysis: Secondary | ICD-10-CM | POA: Diagnosis not present

## 2021-12-03 DIAGNOSIS — N186 End stage renal disease: Secondary | ICD-10-CM | POA: Diagnosis not present

## 2021-12-03 DIAGNOSIS — Z992 Dependence on renal dialysis: Secondary | ICD-10-CM | POA: Diagnosis not present

## 2021-12-03 DIAGNOSIS — N2581 Secondary hyperparathyroidism of renal origin: Secondary | ICD-10-CM | POA: Diagnosis not present

## 2021-12-05 DIAGNOSIS — N2581 Secondary hyperparathyroidism of renal origin: Secondary | ICD-10-CM | POA: Diagnosis not present

## 2021-12-05 DIAGNOSIS — N186 End stage renal disease: Secondary | ICD-10-CM | POA: Diagnosis not present

## 2021-12-05 DIAGNOSIS — Z992 Dependence on renal dialysis: Secondary | ICD-10-CM | POA: Diagnosis not present

## 2021-12-08 DIAGNOSIS — N2581 Secondary hyperparathyroidism of renal origin: Secondary | ICD-10-CM | POA: Diagnosis not present

## 2021-12-08 DIAGNOSIS — N186 End stage renal disease: Secondary | ICD-10-CM | POA: Diagnosis not present

## 2021-12-08 DIAGNOSIS — Z992 Dependence on renal dialysis: Secondary | ICD-10-CM | POA: Diagnosis not present

## 2021-12-10 DIAGNOSIS — N2581 Secondary hyperparathyroidism of renal origin: Secondary | ICD-10-CM | POA: Diagnosis not present

## 2021-12-10 DIAGNOSIS — N186 End stage renal disease: Secondary | ICD-10-CM | POA: Diagnosis not present

## 2021-12-10 DIAGNOSIS — Z992 Dependence on renal dialysis: Secondary | ICD-10-CM | POA: Diagnosis not present

## 2021-12-11 DIAGNOSIS — Z992 Dependence on renal dialysis: Secondary | ICD-10-CM | POA: Diagnosis not present

## 2021-12-11 DIAGNOSIS — N186 End stage renal disease: Secondary | ICD-10-CM | POA: Diagnosis not present

## 2021-12-12 DIAGNOSIS — N2581 Secondary hyperparathyroidism of renal origin: Secondary | ICD-10-CM | POA: Diagnosis not present

## 2021-12-12 DIAGNOSIS — N186 End stage renal disease: Secondary | ICD-10-CM | POA: Diagnosis not present

## 2021-12-12 DIAGNOSIS — Z992 Dependence on renal dialysis: Secondary | ICD-10-CM | POA: Diagnosis not present

## 2021-12-15 DIAGNOSIS — N2581 Secondary hyperparathyroidism of renal origin: Secondary | ICD-10-CM | POA: Diagnosis not present

## 2021-12-15 DIAGNOSIS — Z992 Dependence on renal dialysis: Secondary | ICD-10-CM | POA: Diagnosis not present

## 2021-12-15 DIAGNOSIS — N186 End stage renal disease: Secondary | ICD-10-CM | POA: Diagnosis not present

## 2021-12-17 DIAGNOSIS — N186 End stage renal disease: Secondary | ICD-10-CM | POA: Diagnosis not present

## 2021-12-17 DIAGNOSIS — Z992 Dependence on renal dialysis: Secondary | ICD-10-CM | POA: Diagnosis not present

## 2021-12-17 DIAGNOSIS — N2581 Secondary hyperparathyroidism of renal origin: Secondary | ICD-10-CM | POA: Diagnosis not present

## 2021-12-19 DIAGNOSIS — N186 End stage renal disease: Secondary | ICD-10-CM | POA: Diagnosis not present

## 2021-12-19 DIAGNOSIS — N2581 Secondary hyperparathyroidism of renal origin: Secondary | ICD-10-CM | POA: Diagnosis not present

## 2021-12-19 DIAGNOSIS — Z992 Dependence on renal dialysis: Secondary | ICD-10-CM | POA: Diagnosis not present

## 2021-12-22 DIAGNOSIS — Z992 Dependence on renal dialysis: Secondary | ICD-10-CM | POA: Diagnosis not present

## 2021-12-22 DIAGNOSIS — N186 End stage renal disease: Secondary | ICD-10-CM | POA: Diagnosis not present

## 2021-12-22 DIAGNOSIS — N2581 Secondary hyperparathyroidism of renal origin: Secondary | ICD-10-CM | POA: Diagnosis not present

## 2021-12-24 DIAGNOSIS — Z992 Dependence on renal dialysis: Secondary | ICD-10-CM | POA: Diagnosis not present

## 2021-12-24 DIAGNOSIS — N186 End stage renal disease: Secondary | ICD-10-CM | POA: Diagnosis not present

## 2021-12-24 DIAGNOSIS — N2581 Secondary hyperparathyroidism of renal origin: Secondary | ICD-10-CM | POA: Diagnosis not present

## 2021-12-26 DIAGNOSIS — N2581 Secondary hyperparathyroidism of renal origin: Secondary | ICD-10-CM | POA: Diagnosis not present

## 2021-12-26 DIAGNOSIS — Z992 Dependence on renal dialysis: Secondary | ICD-10-CM | POA: Diagnosis not present

## 2021-12-26 DIAGNOSIS — N186 End stage renal disease: Secondary | ICD-10-CM | POA: Diagnosis not present

## 2021-12-29 DIAGNOSIS — N2581 Secondary hyperparathyroidism of renal origin: Secondary | ICD-10-CM | POA: Diagnosis not present

## 2021-12-29 DIAGNOSIS — N186 End stage renal disease: Secondary | ICD-10-CM | POA: Diagnosis not present

## 2021-12-29 DIAGNOSIS — Z992 Dependence on renal dialysis: Secondary | ICD-10-CM | POA: Diagnosis not present

## 2021-12-31 DIAGNOSIS — N186 End stage renal disease: Secondary | ICD-10-CM | POA: Diagnosis not present

## 2021-12-31 DIAGNOSIS — Z992 Dependence on renal dialysis: Secondary | ICD-10-CM | POA: Diagnosis not present

## 2021-12-31 DIAGNOSIS — N2581 Secondary hyperparathyroidism of renal origin: Secondary | ICD-10-CM | POA: Diagnosis not present

## 2022-01-02 DIAGNOSIS — N2581 Secondary hyperparathyroidism of renal origin: Secondary | ICD-10-CM | POA: Diagnosis not present

## 2022-01-02 DIAGNOSIS — Z992 Dependence on renal dialysis: Secondary | ICD-10-CM | POA: Diagnosis not present

## 2022-01-02 DIAGNOSIS — N186 End stage renal disease: Secondary | ICD-10-CM | POA: Diagnosis not present

## 2022-01-05 DIAGNOSIS — N2581 Secondary hyperparathyroidism of renal origin: Secondary | ICD-10-CM | POA: Diagnosis not present

## 2022-01-05 DIAGNOSIS — Z992 Dependence on renal dialysis: Secondary | ICD-10-CM | POA: Diagnosis not present

## 2022-01-05 DIAGNOSIS — N186 End stage renal disease: Secondary | ICD-10-CM | POA: Diagnosis not present

## 2022-01-07 DIAGNOSIS — N186 End stage renal disease: Secondary | ICD-10-CM | POA: Diagnosis not present

## 2022-01-07 DIAGNOSIS — N2581 Secondary hyperparathyroidism of renal origin: Secondary | ICD-10-CM | POA: Diagnosis not present

## 2022-01-07 DIAGNOSIS — Z992 Dependence on renal dialysis: Secondary | ICD-10-CM | POA: Diagnosis not present

## 2022-01-09 DIAGNOSIS — Z992 Dependence on renal dialysis: Secondary | ICD-10-CM | POA: Diagnosis not present

## 2022-01-09 DIAGNOSIS — N186 End stage renal disease: Secondary | ICD-10-CM | POA: Diagnosis not present

## 2022-01-09 DIAGNOSIS — N2581 Secondary hyperparathyroidism of renal origin: Secondary | ICD-10-CM | POA: Diagnosis not present

## 2022-01-10 DIAGNOSIS — N186 End stage renal disease: Secondary | ICD-10-CM | POA: Diagnosis not present

## 2022-01-10 DIAGNOSIS — Z992 Dependence on renal dialysis: Secondary | ICD-10-CM | POA: Diagnosis not present

## 2022-01-12 DIAGNOSIS — N2581 Secondary hyperparathyroidism of renal origin: Secondary | ICD-10-CM | POA: Diagnosis not present

## 2022-01-12 DIAGNOSIS — Z992 Dependence on renal dialysis: Secondary | ICD-10-CM | POA: Diagnosis not present

## 2022-01-12 DIAGNOSIS — N186 End stage renal disease: Secondary | ICD-10-CM | POA: Diagnosis not present

## 2022-01-14 DIAGNOSIS — Z992 Dependence on renal dialysis: Secondary | ICD-10-CM | POA: Diagnosis not present

## 2022-01-14 DIAGNOSIS — N186 End stage renal disease: Secondary | ICD-10-CM | POA: Diagnosis not present

## 2022-01-14 DIAGNOSIS — N2581 Secondary hyperparathyroidism of renal origin: Secondary | ICD-10-CM | POA: Diagnosis not present

## 2022-01-16 DIAGNOSIS — Z992 Dependence on renal dialysis: Secondary | ICD-10-CM | POA: Diagnosis not present

## 2022-01-16 DIAGNOSIS — N186 End stage renal disease: Secondary | ICD-10-CM | POA: Diagnosis not present

## 2022-01-16 DIAGNOSIS — N2581 Secondary hyperparathyroidism of renal origin: Secondary | ICD-10-CM | POA: Diagnosis not present

## 2022-01-19 DIAGNOSIS — E1122 Type 2 diabetes mellitus with diabetic chronic kidney disease: Secondary | ICD-10-CM | POA: Diagnosis not present

## 2022-01-19 DIAGNOSIS — Z794 Long term (current) use of insulin: Secondary | ICD-10-CM | POA: Diagnosis not present

## 2022-01-19 DIAGNOSIS — Z992 Dependence on renal dialysis: Secondary | ICD-10-CM | POA: Diagnosis not present

## 2022-01-19 DIAGNOSIS — N2581 Secondary hyperparathyroidism of renal origin: Secondary | ICD-10-CM | POA: Diagnosis not present

## 2022-01-19 DIAGNOSIS — N186 End stage renal disease: Secondary | ICD-10-CM | POA: Diagnosis not present

## 2022-01-21 DIAGNOSIS — N2581 Secondary hyperparathyroidism of renal origin: Secondary | ICD-10-CM | POA: Diagnosis not present

## 2022-01-21 DIAGNOSIS — N186 End stage renal disease: Secondary | ICD-10-CM | POA: Diagnosis not present

## 2022-01-21 DIAGNOSIS — Z992 Dependence on renal dialysis: Secondary | ICD-10-CM | POA: Diagnosis not present

## 2022-01-22 DIAGNOSIS — I10 Essential (primary) hypertension: Secondary | ICD-10-CM | POA: Diagnosis not present

## 2022-01-22 DIAGNOSIS — E1122 Type 2 diabetes mellitus with diabetic chronic kidney disease: Secondary | ICD-10-CM | POA: Diagnosis not present

## 2022-01-23 DIAGNOSIS — Z992 Dependence on renal dialysis: Secondary | ICD-10-CM | POA: Diagnosis not present

## 2022-01-23 DIAGNOSIS — N186 End stage renal disease: Secondary | ICD-10-CM | POA: Diagnosis not present

## 2022-01-23 DIAGNOSIS — N2581 Secondary hyperparathyroidism of renal origin: Secondary | ICD-10-CM | POA: Diagnosis not present

## 2022-01-26 DIAGNOSIS — N2581 Secondary hyperparathyroidism of renal origin: Secondary | ICD-10-CM | POA: Diagnosis not present

## 2022-01-26 DIAGNOSIS — N186 End stage renal disease: Secondary | ICD-10-CM | POA: Diagnosis not present

## 2022-01-26 DIAGNOSIS — Z992 Dependence on renal dialysis: Secondary | ICD-10-CM | POA: Diagnosis not present

## 2022-01-28 DIAGNOSIS — N186 End stage renal disease: Secondary | ICD-10-CM | POA: Diagnosis not present

## 2022-01-28 DIAGNOSIS — N2581 Secondary hyperparathyroidism of renal origin: Secondary | ICD-10-CM | POA: Diagnosis not present

## 2022-01-28 DIAGNOSIS — Z992 Dependence on renal dialysis: Secondary | ICD-10-CM | POA: Diagnosis not present

## 2022-01-30 DIAGNOSIS — N186 End stage renal disease: Secondary | ICD-10-CM | POA: Diagnosis not present

## 2022-01-30 DIAGNOSIS — Z992 Dependence on renal dialysis: Secondary | ICD-10-CM | POA: Diagnosis not present

## 2022-01-30 DIAGNOSIS — N2581 Secondary hyperparathyroidism of renal origin: Secondary | ICD-10-CM | POA: Diagnosis not present

## 2022-02-02 DIAGNOSIS — Z992 Dependence on renal dialysis: Secondary | ICD-10-CM | POA: Diagnosis not present

## 2022-02-02 DIAGNOSIS — N186 End stage renal disease: Secondary | ICD-10-CM | POA: Diagnosis not present

## 2022-02-02 DIAGNOSIS — N2581 Secondary hyperparathyroidism of renal origin: Secondary | ICD-10-CM | POA: Diagnosis not present

## 2022-02-03 DIAGNOSIS — E785 Hyperlipidemia, unspecified: Secondary | ICD-10-CM | POA: Diagnosis not present

## 2022-02-03 DIAGNOSIS — D649 Anemia, unspecified: Secondary | ICD-10-CM | POA: Diagnosis not present

## 2022-02-03 DIAGNOSIS — Z992 Dependence on renal dialysis: Secondary | ICD-10-CM | POA: Diagnosis not present

## 2022-02-03 DIAGNOSIS — R143 Flatulence: Secondary | ICD-10-CM | POA: Diagnosis not present

## 2022-02-03 DIAGNOSIS — E1165 Type 2 diabetes mellitus with hyperglycemia: Secondary | ICD-10-CM | POA: Diagnosis not present

## 2022-02-03 DIAGNOSIS — N184 Chronic kidney disease, stage 4 (severe): Secondary | ICD-10-CM | POA: Diagnosis not present

## 2022-02-03 DIAGNOSIS — I10 Essential (primary) hypertension: Secondary | ICD-10-CM | POA: Diagnosis not present

## 2022-02-04 DIAGNOSIS — N186 End stage renal disease: Secondary | ICD-10-CM | POA: Diagnosis not present

## 2022-02-04 DIAGNOSIS — Z992 Dependence on renal dialysis: Secondary | ICD-10-CM | POA: Diagnosis not present

## 2022-02-04 DIAGNOSIS — N2581 Secondary hyperparathyroidism of renal origin: Secondary | ICD-10-CM | POA: Diagnosis not present

## 2022-02-06 DIAGNOSIS — N2581 Secondary hyperparathyroidism of renal origin: Secondary | ICD-10-CM | POA: Diagnosis not present

## 2022-02-06 DIAGNOSIS — Z992 Dependence on renal dialysis: Secondary | ICD-10-CM | POA: Diagnosis not present

## 2022-02-06 DIAGNOSIS — N186 End stage renal disease: Secondary | ICD-10-CM | POA: Diagnosis not present

## 2022-02-09 DIAGNOSIS — N2581 Secondary hyperparathyroidism of renal origin: Secondary | ICD-10-CM | POA: Diagnosis not present

## 2022-02-09 DIAGNOSIS — Z992 Dependence on renal dialysis: Secondary | ICD-10-CM | POA: Diagnosis not present

## 2022-02-09 DIAGNOSIS — N186 End stage renal disease: Secondary | ICD-10-CM | POA: Diagnosis not present

## 2022-02-10 DIAGNOSIS — Z992 Dependence on renal dialysis: Secondary | ICD-10-CM | POA: Diagnosis not present

## 2022-02-10 DIAGNOSIS — N186 End stage renal disease: Secondary | ICD-10-CM | POA: Diagnosis not present

## 2022-02-11 DIAGNOSIS — Z992 Dependence on renal dialysis: Secondary | ICD-10-CM | POA: Diagnosis not present

## 2022-02-11 DIAGNOSIS — N186 End stage renal disease: Secondary | ICD-10-CM | POA: Diagnosis not present

## 2022-02-11 DIAGNOSIS — N2581 Secondary hyperparathyroidism of renal origin: Secondary | ICD-10-CM | POA: Diagnosis not present

## 2022-02-13 DIAGNOSIS — N186 End stage renal disease: Secondary | ICD-10-CM | POA: Diagnosis not present

## 2022-02-13 DIAGNOSIS — Z992 Dependence on renal dialysis: Secondary | ICD-10-CM | POA: Diagnosis not present

## 2022-02-13 DIAGNOSIS — N2581 Secondary hyperparathyroidism of renal origin: Secondary | ICD-10-CM | POA: Diagnosis not present

## 2022-02-16 DIAGNOSIS — Z992 Dependence on renal dialysis: Secondary | ICD-10-CM | POA: Diagnosis not present

## 2022-02-16 DIAGNOSIS — N2581 Secondary hyperparathyroidism of renal origin: Secondary | ICD-10-CM | POA: Diagnosis not present

## 2022-02-16 DIAGNOSIS — N186 End stage renal disease: Secondary | ICD-10-CM | POA: Diagnosis not present

## 2022-02-18 DIAGNOSIS — N2581 Secondary hyperparathyroidism of renal origin: Secondary | ICD-10-CM | POA: Diagnosis not present

## 2022-02-18 DIAGNOSIS — N186 End stage renal disease: Secondary | ICD-10-CM | POA: Diagnosis not present

## 2022-02-18 DIAGNOSIS — Z992 Dependence on renal dialysis: Secondary | ICD-10-CM | POA: Diagnosis not present

## 2022-02-20 DIAGNOSIS — Z992 Dependence on renal dialysis: Secondary | ICD-10-CM | POA: Diagnosis not present

## 2022-02-20 DIAGNOSIS — N186 End stage renal disease: Secondary | ICD-10-CM | POA: Diagnosis not present

## 2022-02-20 DIAGNOSIS — N2581 Secondary hyperparathyroidism of renal origin: Secondary | ICD-10-CM | POA: Diagnosis not present

## 2022-02-23 DIAGNOSIS — N186 End stage renal disease: Secondary | ICD-10-CM | POA: Diagnosis not present

## 2022-02-23 DIAGNOSIS — N2581 Secondary hyperparathyroidism of renal origin: Secondary | ICD-10-CM | POA: Diagnosis not present

## 2022-02-23 DIAGNOSIS — Z992 Dependence on renal dialysis: Secondary | ICD-10-CM | POA: Diagnosis not present

## 2022-02-24 DIAGNOSIS — N390 Urinary tract infection, site not specified: Secondary | ICD-10-CM | POA: Diagnosis not present

## 2022-02-25 DIAGNOSIS — Z992 Dependence on renal dialysis: Secondary | ICD-10-CM | POA: Diagnosis not present

## 2022-02-25 DIAGNOSIS — N2581 Secondary hyperparathyroidism of renal origin: Secondary | ICD-10-CM | POA: Diagnosis not present

## 2022-02-25 DIAGNOSIS — N186 End stage renal disease: Secondary | ICD-10-CM | POA: Diagnosis not present

## 2022-02-27 DIAGNOSIS — Z992 Dependence on renal dialysis: Secondary | ICD-10-CM | POA: Diagnosis not present

## 2022-02-27 DIAGNOSIS — N2581 Secondary hyperparathyroidism of renal origin: Secondary | ICD-10-CM | POA: Diagnosis not present

## 2022-02-27 DIAGNOSIS — N186 End stage renal disease: Secondary | ICD-10-CM | POA: Diagnosis not present

## 2022-03-02 DIAGNOSIS — N186 End stage renal disease: Secondary | ICD-10-CM | POA: Diagnosis not present

## 2022-03-02 DIAGNOSIS — Z992 Dependence on renal dialysis: Secondary | ICD-10-CM | POA: Diagnosis not present

## 2022-03-02 DIAGNOSIS — N2581 Secondary hyperparathyroidism of renal origin: Secondary | ICD-10-CM | POA: Diagnosis not present

## 2022-03-04 DIAGNOSIS — Z992 Dependence on renal dialysis: Secondary | ICD-10-CM | POA: Diagnosis not present

## 2022-03-04 DIAGNOSIS — N2581 Secondary hyperparathyroidism of renal origin: Secondary | ICD-10-CM | POA: Diagnosis not present

## 2022-03-04 DIAGNOSIS — N186 End stage renal disease: Secondary | ICD-10-CM | POA: Diagnosis not present

## 2022-03-06 DIAGNOSIS — Z992 Dependence on renal dialysis: Secondary | ICD-10-CM | POA: Diagnosis not present

## 2022-03-06 DIAGNOSIS — N186 End stage renal disease: Secondary | ICD-10-CM | POA: Diagnosis not present

## 2022-03-06 DIAGNOSIS — N2581 Secondary hyperparathyroidism of renal origin: Secondary | ICD-10-CM | POA: Diagnosis not present

## 2022-03-09 DIAGNOSIS — T82858A Stenosis of vascular prosthetic devices, implants and grafts, initial encounter: Secondary | ICD-10-CM | POA: Diagnosis not present

## 2022-03-09 DIAGNOSIS — N186 End stage renal disease: Secondary | ICD-10-CM | POA: Diagnosis not present

## 2022-03-09 DIAGNOSIS — Z992 Dependence on renal dialysis: Secondary | ICD-10-CM | POA: Diagnosis not present

## 2022-03-09 DIAGNOSIS — T82868A Thrombosis of vascular prosthetic devices, implants and grafts, initial encounter: Secondary | ICD-10-CM | POA: Diagnosis not present

## 2022-03-10 DIAGNOSIS — Z992 Dependence on renal dialysis: Secondary | ICD-10-CM | POA: Diagnosis not present

## 2022-03-10 DIAGNOSIS — N186 End stage renal disease: Secondary | ICD-10-CM | POA: Diagnosis not present

## 2022-03-10 DIAGNOSIS — N2581 Secondary hyperparathyroidism of renal origin: Secondary | ICD-10-CM | POA: Diagnosis not present

## 2022-03-11 DIAGNOSIS — N186 End stage renal disease: Secondary | ICD-10-CM | POA: Diagnosis not present

## 2022-03-11 DIAGNOSIS — Z992 Dependence on renal dialysis: Secondary | ICD-10-CM | POA: Diagnosis not present

## 2022-03-11 DIAGNOSIS — N2581 Secondary hyperparathyroidism of renal origin: Secondary | ICD-10-CM | POA: Diagnosis not present

## 2022-03-12 DIAGNOSIS — N186 End stage renal disease: Secondary | ICD-10-CM | POA: Diagnosis not present

## 2022-03-12 DIAGNOSIS — Z992 Dependence on renal dialysis: Secondary | ICD-10-CM | POA: Diagnosis not present

## 2022-03-13 DIAGNOSIS — N186 End stage renal disease: Secondary | ICD-10-CM | POA: Diagnosis not present

## 2022-03-13 DIAGNOSIS — N2581 Secondary hyperparathyroidism of renal origin: Secondary | ICD-10-CM | POA: Diagnosis not present

## 2022-03-13 DIAGNOSIS — Z992 Dependence on renal dialysis: Secondary | ICD-10-CM | POA: Diagnosis not present

## 2022-03-16 DIAGNOSIS — N2581 Secondary hyperparathyroidism of renal origin: Secondary | ICD-10-CM | POA: Diagnosis not present

## 2022-03-16 DIAGNOSIS — N186 End stage renal disease: Secondary | ICD-10-CM | POA: Diagnosis not present

## 2022-03-16 DIAGNOSIS — Z992 Dependence on renal dialysis: Secondary | ICD-10-CM | POA: Diagnosis not present

## 2022-03-18 DIAGNOSIS — Z992 Dependence on renal dialysis: Secondary | ICD-10-CM | POA: Diagnosis not present

## 2022-03-18 DIAGNOSIS — N186 End stage renal disease: Secondary | ICD-10-CM | POA: Diagnosis not present

## 2022-03-18 DIAGNOSIS — N2581 Secondary hyperparathyroidism of renal origin: Secondary | ICD-10-CM | POA: Diagnosis not present

## 2022-03-20 DIAGNOSIS — N186 End stage renal disease: Secondary | ICD-10-CM | POA: Diagnosis not present

## 2022-03-20 DIAGNOSIS — N2581 Secondary hyperparathyroidism of renal origin: Secondary | ICD-10-CM | POA: Diagnosis not present

## 2022-03-20 DIAGNOSIS — Z992 Dependence on renal dialysis: Secondary | ICD-10-CM | POA: Diagnosis not present

## 2022-03-23 DIAGNOSIS — N2581 Secondary hyperparathyroidism of renal origin: Secondary | ICD-10-CM | POA: Diagnosis not present

## 2022-03-23 DIAGNOSIS — Z992 Dependence on renal dialysis: Secondary | ICD-10-CM | POA: Diagnosis not present

## 2022-03-23 DIAGNOSIS — N186 End stage renal disease: Secondary | ICD-10-CM | POA: Diagnosis not present

## 2022-03-25 DIAGNOSIS — N2581 Secondary hyperparathyroidism of renal origin: Secondary | ICD-10-CM | POA: Diagnosis not present

## 2022-03-25 DIAGNOSIS — Z992 Dependence on renal dialysis: Secondary | ICD-10-CM | POA: Diagnosis not present

## 2022-03-25 DIAGNOSIS — N186 End stage renal disease: Secondary | ICD-10-CM | POA: Diagnosis not present

## 2022-03-27 DIAGNOSIS — Z992 Dependence on renal dialysis: Secondary | ICD-10-CM | POA: Diagnosis not present

## 2022-03-27 DIAGNOSIS — N2581 Secondary hyperparathyroidism of renal origin: Secondary | ICD-10-CM | POA: Diagnosis not present

## 2022-03-27 DIAGNOSIS — N186 End stage renal disease: Secondary | ICD-10-CM | POA: Diagnosis not present

## 2022-03-30 DIAGNOSIS — N186 End stage renal disease: Secondary | ICD-10-CM | POA: Diagnosis not present

## 2022-03-30 DIAGNOSIS — N2581 Secondary hyperparathyroidism of renal origin: Secondary | ICD-10-CM | POA: Diagnosis not present

## 2022-03-30 DIAGNOSIS — Z992 Dependence on renal dialysis: Secondary | ICD-10-CM | POA: Diagnosis not present

## 2022-04-01 DIAGNOSIS — N186 End stage renal disease: Secondary | ICD-10-CM | POA: Diagnosis not present

## 2022-04-01 DIAGNOSIS — N2581 Secondary hyperparathyroidism of renal origin: Secondary | ICD-10-CM | POA: Diagnosis not present

## 2022-04-01 DIAGNOSIS — Z992 Dependence on renal dialysis: Secondary | ICD-10-CM | POA: Diagnosis not present

## 2022-04-04 DIAGNOSIS — N186 End stage renal disease: Secondary | ICD-10-CM | POA: Diagnosis not present

## 2022-04-04 DIAGNOSIS — N2581 Secondary hyperparathyroidism of renal origin: Secondary | ICD-10-CM | POA: Diagnosis not present

## 2022-04-04 DIAGNOSIS — Z992 Dependence on renal dialysis: Secondary | ICD-10-CM | POA: Diagnosis not present

## 2022-04-07 DIAGNOSIS — Z992 Dependence on renal dialysis: Secondary | ICD-10-CM | POA: Diagnosis not present

## 2022-04-07 DIAGNOSIS — N2581 Secondary hyperparathyroidism of renal origin: Secondary | ICD-10-CM | POA: Diagnosis not present

## 2022-04-07 DIAGNOSIS — N186 End stage renal disease: Secondary | ICD-10-CM | POA: Diagnosis not present

## 2022-04-08 DIAGNOSIS — Z992 Dependence on renal dialysis: Secondary | ICD-10-CM | POA: Diagnosis not present

## 2022-04-08 DIAGNOSIS — N2581 Secondary hyperparathyroidism of renal origin: Secondary | ICD-10-CM | POA: Diagnosis not present

## 2022-04-08 DIAGNOSIS — N186 End stage renal disease: Secondary | ICD-10-CM | POA: Diagnosis not present

## 2022-04-10 DIAGNOSIS — N186 End stage renal disease: Secondary | ICD-10-CM | POA: Diagnosis not present

## 2022-04-10 DIAGNOSIS — N2581 Secondary hyperparathyroidism of renal origin: Secondary | ICD-10-CM | POA: Diagnosis not present

## 2022-04-10 DIAGNOSIS — Z992 Dependence on renal dialysis: Secondary | ICD-10-CM | POA: Diagnosis not present

## 2022-04-12 DIAGNOSIS — N186 End stage renal disease: Secondary | ICD-10-CM | POA: Diagnosis not present

## 2022-04-12 DIAGNOSIS — Z992 Dependence on renal dialysis: Secondary | ICD-10-CM | POA: Diagnosis not present

## 2022-04-13 DIAGNOSIS — N2581 Secondary hyperparathyroidism of renal origin: Secondary | ICD-10-CM | POA: Diagnosis not present

## 2022-04-13 DIAGNOSIS — Z992 Dependence on renal dialysis: Secondary | ICD-10-CM | POA: Diagnosis not present

## 2022-04-13 DIAGNOSIS — N186 End stage renal disease: Secondary | ICD-10-CM | POA: Diagnosis not present

## 2022-04-15 DIAGNOSIS — Z992 Dependence on renal dialysis: Secondary | ICD-10-CM | POA: Diagnosis not present

## 2022-04-15 DIAGNOSIS — N186 End stage renal disease: Secondary | ICD-10-CM | POA: Diagnosis not present

## 2022-04-15 DIAGNOSIS — N2581 Secondary hyperparathyroidism of renal origin: Secondary | ICD-10-CM | POA: Diagnosis not present

## 2022-04-16 DIAGNOSIS — N390 Urinary tract infection, site not specified: Secondary | ICD-10-CM | POA: Diagnosis not present

## 2022-04-17 DIAGNOSIS — N186 End stage renal disease: Secondary | ICD-10-CM | POA: Diagnosis not present

## 2022-04-17 DIAGNOSIS — Z992 Dependence on renal dialysis: Secondary | ICD-10-CM | POA: Diagnosis not present

## 2022-04-17 DIAGNOSIS — N2581 Secondary hyperparathyroidism of renal origin: Secondary | ICD-10-CM | POA: Diagnosis not present

## 2022-04-20 DIAGNOSIS — N2581 Secondary hyperparathyroidism of renal origin: Secondary | ICD-10-CM | POA: Diagnosis not present

## 2022-04-20 DIAGNOSIS — Z794 Long term (current) use of insulin: Secondary | ICD-10-CM | POA: Diagnosis not present

## 2022-04-20 DIAGNOSIS — E1122 Type 2 diabetes mellitus with diabetic chronic kidney disease: Secondary | ICD-10-CM | POA: Diagnosis not present

## 2022-04-20 DIAGNOSIS — N186 End stage renal disease: Secondary | ICD-10-CM | POA: Diagnosis not present

## 2022-04-20 DIAGNOSIS — Z992 Dependence on renal dialysis: Secondary | ICD-10-CM | POA: Diagnosis not present

## 2022-04-22 DIAGNOSIS — N186 End stage renal disease: Secondary | ICD-10-CM | POA: Diagnosis not present

## 2022-04-22 DIAGNOSIS — Z992 Dependence on renal dialysis: Secondary | ICD-10-CM | POA: Diagnosis not present

## 2022-04-22 DIAGNOSIS — N2581 Secondary hyperparathyroidism of renal origin: Secondary | ICD-10-CM | POA: Diagnosis not present

## 2022-04-24 DIAGNOSIS — N2581 Secondary hyperparathyroidism of renal origin: Secondary | ICD-10-CM | POA: Diagnosis not present

## 2022-04-24 DIAGNOSIS — N186 End stage renal disease: Secondary | ICD-10-CM | POA: Diagnosis not present

## 2022-04-24 DIAGNOSIS — Z992 Dependence on renal dialysis: Secondary | ICD-10-CM | POA: Diagnosis not present

## 2022-04-27 DIAGNOSIS — N186 End stage renal disease: Secondary | ICD-10-CM | POA: Diagnosis not present

## 2022-04-27 DIAGNOSIS — N2581 Secondary hyperparathyroidism of renal origin: Secondary | ICD-10-CM | POA: Diagnosis not present

## 2022-04-27 DIAGNOSIS — Z992 Dependence on renal dialysis: Secondary | ICD-10-CM | POA: Diagnosis not present

## 2022-04-29 DIAGNOSIS — Z992 Dependence on renal dialysis: Secondary | ICD-10-CM | POA: Diagnosis not present

## 2022-04-29 DIAGNOSIS — N186 End stage renal disease: Secondary | ICD-10-CM | POA: Diagnosis not present

## 2022-04-29 DIAGNOSIS — N2581 Secondary hyperparathyroidism of renal origin: Secondary | ICD-10-CM | POA: Diagnosis not present

## 2022-05-01 DIAGNOSIS — Z992 Dependence on renal dialysis: Secondary | ICD-10-CM | POA: Diagnosis not present

## 2022-05-01 DIAGNOSIS — N186 End stage renal disease: Secondary | ICD-10-CM | POA: Diagnosis not present

## 2022-05-01 DIAGNOSIS — N2581 Secondary hyperparathyroidism of renal origin: Secondary | ICD-10-CM | POA: Diagnosis not present

## 2022-05-04 DIAGNOSIS — N2581 Secondary hyperparathyroidism of renal origin: Secondary | ICD-10-CM | POA: Diagnosis not present

## 2022-05-04 DIAGNOSIS — Z992 Dependence on renal dialysis: Secondary | ICD-10-CM | POA: Diagnosis not present

## 2022-05-04 DIAGNOSIS — N186 End stage renal disease: Secondary | ICD-10-CM | POA: Diagnosis not present

## 2022-05-06 DIAGNOSIS — Z992 Dependence on renal dialysis: Secondary | ICD-10-CM | POA: Diagnosis not present

## 2022-05-06 DIAGNOSIS — N2581 Secondary hyperparathyroidism of renal origin: Secondary | ICD-10-CM | POA: Diagnosis not present

## 2022-05-06 DIAGNOSIS — N186 End stage renal disease: Secondary | ICD-10-CM | POA: Diagnosis not present

## 2022-05-08 DIAGNOSIS — Z992 Dependence on renal dialysis: Secondary | ICD-10-CM | POA: Diagnosis not present

## 2022-05-08 DIAGNOSIS — N2581 Secondary hyperparathyroidism of renal origin: Secondary | ICD-10-CM | POA: Diagnosis not present

## 2022-05-08 DIAGNOSIS — N186 End stage renal disease: Secondary | ICD-10-CM | POA: Diagnosis not present

## 2022-05-11 DIAGNOSIS — Z992 Dependence on renal dialysis: Secondary | ICD-10-CM | POA: Diagnosis not present

## 2022-05-11 DIAGNOSIS — N186 End stage renal disease: Secondary | ICD-10-CM | POA: Diagnosis not present

## 2022-05-11 DIAGNOSIS — N2581 Secondary hyperparathyroidism of renal origin: Secondary | ICD-10-CM | POA: Diagnosis not present

## 2022-05-13 DIAGNOSIS — Z992 Dependence on renal dialysis: Secondary | ICD-10-CM | POA: Diagnosis not present

## 2022-05-13 DIAGNOSIS — N186 End stage renal disease: Secondary | ICD-10-CM | POA: Diagnosis not present

## 2022-05-13 DIAGNOSIS — N2581 Secondary hyperparathyroidism of renal origin: Secondary | ICD-10-CM | POA: Diagnosis not present

## 2022-05-15 DIAGNOSIS — N2581 Secondary hyperparathyroidism of renal origin: Secondary | ICD-10-CM | POA: Diagnosis not present

## 2022-05-15 DIAGNOSIS — Z992 Dependence on renal dialysis: Secondary | ICD-10-CM | POA: Diagnosis not present

## 2022-05-15 DIAGNOSIS — N186 End stage renal disease: Secondary | ICD-10-CM | POA: Diagnosis not present

## 2022-05-18 DIAGNOSIS — N186 End stage renal disease: Secondary | ICD-10-CM | POA: Diagnosis not present

## 2022-05-18 DIAGNOSIS — N2581 Secondary hyperparathyroidism of renal origin: Secondary | ICD-10-CM | POA: Diagnosis not present

## 2022-05-18 DIAGNOSIS — Z992 Dependence on renal dialysis: Secondary | ICD-10-CM | POA: Diagnosis not present

## 2022-05-20 DIAGNOSIS — Z992 Dependence on renal dialysis: Secondary | ICD-10-CM | POA: Diagnosis not present

## 2022-05-20 DIAGNOSIS — N186 End stage renal disease: Secondary | ICD-10-CM | POA: Diagnosis not present

## 2022-05-20 DIAGNOSIS — N2581 Secondary hyperparathyroidism of renal origin: Secondary | ICD-10-CM | POA: Diagnosis not present

## 2022-05-22 DIAGNOSIS — N186 End stage renal disease: Secondary | ICD-10-CM | POA: Diagnosis not present

## 2022-05-22 DIAGNOSIS — N2581 Secondary hyperparathyroidism of renal origin: Secondary | ICD-10-CM | POA: Diagnosis not present

## 2022-05-22 DIAGNOSIS — Z992 Dependence on renal dialysis: Secondary | ICD-10-CM | POA: Diagnosis not present

## 2022-05-25 DIAGNOSIS — N2581 Secondary hyperparathyroidism of renal origin: Secondary | ICD-10-CM | POA: Diagnosis not present

## 2022-05-25 DIAGNOSIS — N186 End stage renal disease: Secondary | ICD-10-CM | POA: Diagnosis not present

## 2022-05-25 DIAGNOSIS — Z992 Dependence on renal dialysis: Secondary | ICD-10-CM | POA: Diagnosis not present

## 2022-05-27 DIAGNOSIS — N2581 Secondary hyperparathyroidism of renal origin: Secondary | ICD-10-CM | POA: Diagnosis not present

## 2022-05-27 DIAGNOSIS — Z992 Dependence on renal dialysis: Secondary | ICD-10-CM | POA: Diagnosis not present

## 2022-05-27 DIAGNOSIS — N186 End stage renal disease: Secondary | ICD-10-CM | POA: Diagnosis not present

## 2022-05-29 ENCOUNTER — Telehealth: Payer: Self-pay | Admitting: *Deleted

## 2022-05-29 ENCOUNTER — Telehealth: Payer: Self-pay

## 2022-05-29 DIAGNOSIS — I10 Essential (primary) hypertension: Secondary | ICD-10-CM

## 2022-05-29 DIAGNOSIS — N186 End stage renal disease: Secondary | ICD-10-CM | POA: Diagnosis not present

## 2022-05-29 DIAGNOSIS — Z992 Dependence on renal dialysis: Secondary | ICD-10-CM | POA: Diagnosis not present

## 2022-05-29 DIAGNOSIS — N2581 Secondary hyperparathyroidism of renal origin: Secondary | ICD-10-CM | POA: Diagnosis not present

## 2022-05-29 DIAGNOSIS — E1122 Type 2 diabetes mellitus with diabetic chronic kidney disease: Secondary | ICD-10-CM

## 2022-05-29 NOTE — Patient Outreach (Signed)
Received a referral from [facility]. The Primary Care Physician is using [facility] services.   I have sent a referral to [facility] to follow up and sent a referral to [facility].  [facility], [place], [place] [facility] [phone]

## 2022-05-29 NOTE — Progress Notes (Signed)
  Care Coordination  Outreach Note  05/29/2022 Name: Trevor [name] MRN: [id number] DOB: Jun 28, 1940   Care Coordination Outreach Attempts: An unsuccessful telephone outreach was attempted today to offer the patient information about available care coordination services as a benefit of their health plan.   Follow Up Plan:  Additional outreach attempts will be made to offer the patient care coordination information and services.   Encounter Outcome:  No Answer  [place]  Direct Dial: [phone]

## 2022-06-01 DIAGNOSIS — Z992 Dependence on renal dialysis: Secondary | ICD-10-CM | POA: Diagnosis not present

## 2022-06-01 DIAGNOSIS — N2581 Secondary hyperparathyroidism of renal origin: Secondary | ICD-10-CM | POA: Diagnosis not present

## 2022-06-01 DIAGNOSIS — N186 End stage renal disease: Secondary | ICD-10-CM | POA: Diagnosis not present

## 2022-06-01 NOTE — Progress Notes (Signed)
  Care Coordination  Outreach Note  06/01/2022 Name: ANTRON GLAVIANO MRN: [id number] DOB: 07/02/1940   Care Coordination Outreach Attempts: A second unsuccessful outreach was attempted today to offer the patient with information about available care coordination services as a benefit of their health plan.     Follow Up Plan:  Additional outreach attempts will be made to offer the patient care coordination information and services.   Encounter Outcome:  No Answer  [place]  Direct Dial: [phone]

## 2022-06-03 DIAGNOSIS — Z992 Dependence on renal dialysis: Secondary | ICD-10-CM | POA: Diagnosis not present

## 2022-06-03 DIAGNOSIS — N2581 Secondary hyperparathyroidism of renal origin: Secondary | ICD-10-CM | POA: Diagnosis not present

## 2022-06-03 DIAGNOSIS — N186 End stage renal disease: Secondary | ICD-10-CM | POA: Diagnosis not present

## 2022-06-05 DIAGNOSIS — Z992 Dependence on renal dialysis: Secondary | ICD-10-CM | POA: Diagnosis not present

## 2022-06-05 DIAGNOSIS — N186 End stage renal disease: Secondary | ICD-10-CM | POA: Diagnosis not present

## 2022-06-05 DIAGNOSIS — N2581 Secondary hyperparathyroidism of renal origin: Secondary | ICD-10-CM | POA: Diagnosis not present

## 2022-06-05 NOTE — Progress Notes (Unsigned)
  Care Coordination  Outreach Note  06/05/2022 Name: Trevor [name] MRN: [id number] DOB: 1940-09-29   Care Coordination Outreach Attempts: An unsuccessful telephone outreach was attempted today to offer the patient information about available care coordination services as a benefit of their health plan.   Follow Up Plan:  Additional outreach attempts will be made to offer the patient care coordination information and services.   Encounter Outcome:  Pt. Request to Call [place]  Direct Dial: [phone]

## 2022-06-08 DIAGNOSIS — Z992 Dependence on renal dialysis: Secondary | ICD-10-CM | POA: Diagnosis not present

## 2022-06-08 DIAGNOSIS — N186 End stage renal disease: Secondary | ICD-10-CM | POA: Diagnosis not present

## 2022-06-08 DIAGNOSIS — N2581 Secondary hyperparathyroidism of renal origin: Secondary | ICD-10-CM | POA: Diagnosis not present

## 2022-06-08 NOTE — Progress Notes (Signed)
  Care Coordination  Outreach Note  06/08/2022 Name: DAUGHTRY DOBEK MRN: [id number] DOB: 02/24/1941   Care Coordination Outreach Attempts: A third unsuccessful outreach was attempted today to offer the patient with information about available care coordination services as a benefit of their health plan.   Follow Up Plan:  No further outreach attempts will be made at this time. We have been unable to contact the patient to offer or enroll patient in care coordination services  Encounter Outcome:  No Answer  [place]: [phone]

## 2022-06-10 DIAGNOSIS — N186 End stage renal disease: Secondary | ICD-10-CM | POA: Diagnosis not present

## 2022-06-10 DIAGNOSIS — N2581 Secondary hyperparathyroidism of renal origin: Secondary | ICD-10-CM | POA: Diagnosis not present

## 2022-06-10 DIAGNOSIS — Z992 Dependence on renal dialysis: Secondary | ICD-10-CM | POA: Diagnosis not present

## 2022-06-11 DIAGNOSIS — Z992 Dependence on renal dialysis: Secondary | ICD-10-CM | POA: Diagnosis not present

## 2022-06-11 DIAGNOSIS — N186 End stage renal disease: Secondary | ICD-10-CM | POA: Diagnosis not present

## 2022-06-12 DIAGNOSIS — Z992 Dependence on renal dialysis: Secondary | ICD-10-CM | POA: Diagnosis not present

## 2022-06-12 DIAGNOSIS — N186 End stage renal disease: Secondary | ICD-10-CM | POA: Diagnosis not present

## 2022-06-12 DIAGNOSIS — N2581 Secondary hyperparathyroidism of renal origin: Secondary | ICD-10-CM | POA: Diagnosis not present

## 2022-06-15 DIAGNOSIS — Z992 Dependence on renal dialysis: Secondary | ICD-10-CM | POA: Diagnosis not present

## 2022-06-15 DIAGNOSIS — N2581 Secondary hyperparathyroidism of renal origin: Secondary | ICD-10-CM | POA: Diagnosis not present

## 2022-06-15 DIAGNOSIS — N186 End stage renal disease: Secondary | ICD-10-CM | POA: Diagnosis not present

## 2022-06-17 DIAGNOSIS — N186 End stage renal disease: Secondary | ICD-10-CM | POA: Diagnosis not present

## 2022-06-17 DIAGNOSIS — N2581 Secondary hyperparathyroidism of renal origin: Secondary | ICD-10-CM | POA: Diagnosis not present

## 2022-06-17 DIAGNOSIS — Z992 Dependence on renal dialysis: Secondary | ICD-10-CM | POA: Diagnosis not present

## 2022-06-19 DIAGNOSIS — N186 End stage renal disease: Secondary | ICD-10-CM | POA: Diagnosis not present

## 2022-06-19 DIAGNOSIS — N2581 Secondary hyperparathyroidism of renal origin: Secondary | ICD-10-CM | POA: Diagnosis not present

## 2022-06-19 DIAGNOSIS — Z992 Dependence on renal dialysis: Secondary | ICD-10-CM | POA: Diagnosis not present

## 2022-06-22 DIAGNOSIS — Z992 Dependence on renal dialysis: Secondary | ICD-10-CM | POA: Diagnosis not present

## 2022-06-22 DIAGNOSIS — N2581 Secondary hyperparathyroidism of renal origin: Secondary | ICD-10-CM | POA: Diagnosis not present

## 2022-06-22 DIAGNOSIS — N186 End stage renal disease: Secondary | ICD-10-CM | POA: Diagnosis not present

## 2022-06-24 DIAGNOSIS — N2581 Secondary hyperparathyroidism of renal origin: Secondary | ICD-10-CM | POA: Diagnosis not present

## 2022-06-24 DIAGNOSIS — N186 End stage renal disease: Secondary | ICD-10-CM | POA: Diagnosis not present

## 2022-06-24 DIAGNOSIS — Z992 Dependence on renal dialysis: Secondary | ICD-10-CM | POA: Diagnosis not present

## 2022-06-26 DIAGNOSIS — Z992 Dependence on renal dialysis: Secondary | ICD-10-CM | POA: Diagnosis not present

## 2022-06-26 DIAGNOSIS — N2581 Secondary hyperparathyroidism of renal origin: Secondary | ICD-10-CM | POA: Diagnosis not present

## 2022-06-26 DIAGNOSIS — N186 End stage renal disease: Secondary | ICD-10-CM | POA: Diagnosis not present

## 2022-06-29 DIAGNOSIS — N2581 Secondary hyperparathyroidism of renal origin: Secondary | ICD-10-CM | POA: Diagnosis not present

## 2022-06-29 DIAGNOSIS — Z992 Dependence on renal dialysis: Secondary | ICD-10-CM | POA: Diagnosis not present

## 2022-06-29 DIAGNOSIS — N186 End stage renal disease: Secondary | ICD-10-CM | POA: Diagnosis not present

## 2022-07-01 DIAGNOSIS — Z992 Dependence on renal dialysis: Secondary | ICD-10-CM | POA: Diagnosis not present

## 2022-07-01 DIAGNOSIS — N186 End stage renal disease: Secondary | ICD-10-CM | POA: Diagnosis not present

## 2022-07-01 DIAGNOSIS — N2581 Secondary hyperparathyroidism of renal origin: Secondary | ICD-10-CM | POA: Diagnosis not present

## 2022-07-03 DIAGNOSIS — N186 End stage renal disease: Secondary | ICD-10-CM | POA: Diagnosis not present

## 2022-07-03 DIAGNOSIS — Z992 Dependence on renal dialysis: Secondary | ICD-10-CM | POA: Diagnosis not present

## 2022-07-03 DIAGNOSIS — N2581 Secondary hyperparathyroidism of renal origin: Secondary | ICD-10-CM | POA: Diagnosis not present

## 2022-07-06 DIAGNOSIS — Z992 Dependence on renal dialysis: Secondary | ICD-10-CM | POA: Diagnosis not present

## 2022-07-06 DIAGNOSIS — N2581 Secondary hyperparathyroidism of renal origin: Secondary | ICD-10-CM | POA: Diagnosis not present

## 2022-07-06 DIAGNOSIS — N186 End stage renal disease: Secondary | ICD-10-CM | POA: Diagnosis not present

## 2022-07-08 DIAGNOSIS — N2581 Secondary hyperparathyroidism of renal origin: Secondary | ICD-10-CM | POA: Diagnosis not present

## 2022-07-08 DIAGNOSIS — Z992 Dependence on renal dialysis: Secondary | ICD-10-CM | POA: Diagnosis not present

## 2022-07-08 DIAGNOSIS — N186 End stage renal disease: Secondary | ICD-10-CM | POA: Diagnosis not present

## 2022-07-10 ENCOUNTER — Encounter (HOSPITAL_COMMUNITY): Payer: Self-pay

## 2022-07-10 ENCOUNTER — Emergency Department (HOSPITAL_COMMUNITY): Payer: Medicare HMO

## 2022-07-10 ENCOUNTER — Other Ambulatory Visit: Payer: Self-pay

## 2022-07-10 ENCOUNTER — Emergency Department (HOSPITAL_COMMUNITY)
Admission: EM | Admit: 2022-07-10 | Discharge: 2022-07-10 | Disposition: A | Discharge status: Home / Self Care | Payer: Medicare HMO | Attending: Emergency Medicine | Admitting: Emergency Medicine

## 2022-07-10 DIAGNOSIS — M25562 Pain in left knee: Secondary | ICD-10-CM | POA: Diagnosis not present

## 2022-07-10 DIAGNOSIS — S8992XA Unspecified injury of left lower leg, initial encounter: Secondary | ICD-10-CM | POA: Diagnosis present

## 2022-07-10 DIAGNOSIS — R600 Localized edema: Secondary | ICD-10-CM | POA: Diagnosis not present

## 2022-07-10 DIAGNOSIS — W19XXXA Unspecified fall, initial encounter: Secondary | ICD-10-CM

## 2022-07-10 DIAGNOSIS — N2581 Secondary hyperparathyroidism of renal origin: Secondary | ICD-10-CM | POA: Diagnosis not present

## 2022-07-10 DIAGNOSIS — Y9301 Activity, walking, marching and hiking: Secondary | ICD-10-CM | POA: Insufficient documentation

## 2022-07-10 DIAGNOSIS — W108XXA Fall (on) (from) other stairs and steps, initial encounter: Secondary | ICD-10-CM | POA: Insufficient documentation

## 2022-07-10 DIAGNOSIS — N186 End stage renal disease: Secondary | ICD-10-CM | POA: Diagnosis not present

## 2022-07-10 DIAGNOSIS — S8392XA Sprain of unspecified site of left knee, initial encounter: Secondary | ICD-10-CM | POA: Diagnosis not present

## 2022-07-10 DIAGNOSIS — Z992 Dependence on renal dialysis: Secondary | ICD-10-CM | POA: Diagnosis not present

## 2022-07-10 MED ORDER — OXYCODONE-ACETAMINOPHEN 5-325 MG PO TABS: 1.0000 | ORAL_TABLET | Freq: Four times a day (QID) | ORAL | 0 refills | Status: DC | PRN

## 2022-07-10 MED ORDER — ACETAMINOPHEN 500 MG PO TABS
1000.0000 mg | ORAL_TABLET | Freq: Once | ORAL | Status: AC
  Administered 2022-07-10: 1000 mg via ORAL
  Filled 2022-07-10: qty 2

## 2022-07-10 MED ORDER — FENTANYL CITRATE (PF) 100 MCG/2ML IJ SOLN
100.0000 ug | Freq: Once | INTRAMUSCULAR | Status: AC
  Administered 2022-07-10: 100 ug via INTRAMUSCULAR
  Filled 2022-07-10: qty 2

## 2022-07-10 NOTE — ED Triage Notes (Signed)
Tripped and fell on concrete.  C/o pain in left knee  denies head strike

## 2022-07-10 NOTE — ED Notes (Signed)
Patient transported to CT 

## 2022-07-10 NOTE — ED Provider Notes (Signed)
[facility] Provider Note   CSN: [id number] Arrival date & time: 07/10/22  1557     History  Chief Complaint  Patient presents with   Trevor [name] is a 82 y.o. male.   Fall     Patient presents due to left knee pain.  This happened due to a fall, patient tripped walking down the stairs.  Was only 2 stairs, he did not hit his head or lose consciousness.  He is having pain to the left knee, worse with movement.  Unable to ambulate secondary to pain.  Not on blood thinners.  No pain elsewhere.  Home Medications Prior to Admission medications   Medication Sig Start Date End Date Taking? Authorizing Provider  oxyCODONE-acetaminophen (PERCOCET/ROXICET) 5-325 MG tablet Take 1 tablet by mouth every 6 (six) hours as needed for severe pain. 07/10/22  Yes Sherrill Raring, PA-C  ACCU-CHEK AVIVA PLUS test strip USE TO TEST BLOOD SUGAR BEFORE BREAKFAST AND AT BEDTIME 05/26/21   Cassandria Anger, MD  Accu-Chek Softclix Lancets lancets  09/13/19   [provider]  acetaminophen (TYLENOL) 500 MG tablet Take 1,000 mg by mouth every 6 (six) hours as needed for moderate pain.    [provider]  Alcohol Swabs (B-D SINGLE USE SWABS REGULAR) PADS  08/11/19   [provider]  Blood Glucose Monitoring Suppl (ONETOUCH VERIO) w/Device KIT 1 each by Does not apply route as needed. 05/03/20   Cassandria Anger, MD  carvedilol (COREG) 12.5 MG tablet Take 12.5 mg by mouth 2 (two) times daily with a meal. 10/18/19   [provider]  cloNIDine (CATAPRES - DOSED IN MG/24 HR) 0.2 mg/24hr patch Place 0.2 mg onto the skin once a week. 11/28/20   [provider]  hydrALAZINE (APRESOLINE) 100 MG tablet Take 100 mg by mouth 3 (three) times daily. 07/03/20   [provider]  Melatonin 10 MG TABS Take 10 mg by mouth at bedtime as needed (sleep).    [provider]  oxyCODONE (ROXICODONE) 5 MG immediate  release tablet Take 1 tablet (5 mg total) by mouth every 6 (six) hours as needed. Patient not taking: Reported on 05/27/2021 12/17/20   Gabriel Earing, PA-C  rosuvastatin (CRESTOR) 10 MG tablet Take 10 mg by mouth daily.  Patient not taking: Reported on 05/27/2021 01/26/17   [provider]      Allergies    Patient has no known allergies.    Review of Systems   Review of Systems  Physical Exam Updated Vital Signs BP (!) 170/85   Pulse 70   Temp 98.2 F (36.8 C) (Temporal)   Resp 18   Ht 5\' 10"  (1.778 m)   Wt 98.4 kg   SpO2 99%   BMI 31.14 kg/m  Physical Exam Vitals and nursing note reviewed. Exam conducted with a chaperone present.  Constitutional:      General: He is not in acute distress.    Appearance: Normal appearance.  HENT:     Head: Normocephalic and atraumatic.  Eyes:     General: No scleral icterus.    Extraocular Movements: Extraocular movements intact.     Pupils: Pupils are equal, round, and reactive to light.  Cardiovascular:     Pulses: Normal pulses.  Musculoskeletal:        General: Tenderness present.     Comments: Limited range secondary to pain.  Patient has tenderness over the tibial plateau,  there is no obvious deformity or dislocation appreciated over the patella.  No tenderness palpation over the ankle, hip  Skin:    Coloration: Skin is not jaundiced.  Neurological:     Mental Status: He is alert. Mental status is at baseline.     Coordination: Coordination normal.     ED Results / Procedures / Treatments   Labs (all labs ordered are listed, but only abnormal results are displayed) Labs Reviewed - No data to display  EKG None  Radiology CT Knee Left Wo Contrast  Result Date: 07/10/2022 CLINICAL DATA:  Mechanical fall with left knee pain EXAM: CT OF THE LEFT KNEE WITHOUT CONTRAST TECHNIQUE: Multidetector CT imaging of the left knee was performed according to the standard protocol. Multiplanar CT image reconstructions were also  generated. RADIATION DOSE REDUCTION: This exam was performed according to the departmental dose-optimization program which includes automated exposure control, adjustment of the mA and/or kV according to patient size and/or use of iterative reconstruction technique. COMPARISON:  Left knee radiograph dated 07/10/2018 FINDINGS: Bones/Joint/Cartilage Subtle subchondral sclerosis involving the bilateral tibial plateau without overlying cortical defect. Mild tricompartmental degenerative changes. Ligaments Suboptimally assessed by CT. Muscles and Tendons Intact. Soft tissues Mild prepatellar subcutaneous soft tissue edema. IMPRESSION: 1. Subtle subchondral sclerosis involving the bilateral tibial plateau without overlying cortical defect. Findings may represent stress-related changes. 2. Mild tricompartmental degenerative changes. 3. Mild prepatellar subcutaneous soft tissue edema. Electronically Signed   By: Darrin Nipper M.D.   On: 07/10/2022 18:55   DG Knee Complete 4 Views Left  Result Date: 07/10/2022 CLINICAL DATA:  Pain after fall EXAM: LEFT KNEE - COMPLETE 4 VIEW COMPARISON:  None Available. FINDINGS: No fracture or dislocation. Preserved joint spaces and bone mineralization. Hyperostosis along the patella. No joint effusion on lateral view. Scattered vascular calcifications. IMPRESSION: No acute osseous abnormality Electronically Signed   By: Jill Side M.D.   On: 07/10/2022 16:59    Procedures Procedures    Medications Ordered in ED Medications  fentaNYL (SUBLIMAZE) injection 100 mcg (100 mcg Intramuscular Given 07/10/22 1831)  acetaminophen (TYLENOL) tablet 1,000 mg (1,000 mg Oral Given 07/10/22 1943)    ED Course/ Medical Decision Making/ A&P                             Medical Decision Making Amount and/or Complexity of Data Reviewed Radiology: ordered.  Risk OTC drugs. Prescription drug management.   Patient presents due to left knee pain.  Differential includes fracture,  dislocation, tibial plateau fracture,.  On exam patient has palpable pulses, he is tenderness over the tibial plateau and decreased range of motion secondary to pain.    Possible ligamental sprain, x-ray ordered which is negative for fractures.    Given tenderness over the tibial plateau we will proceed with CT imaging, I also ordered fentanyl for pain.  CT is negative for any acute fractures, he does have tricompartmental degenerative changes and possible stress-related changes but no orthopedic emergency.  I will provide him knee immobilizer, walker and have him follow-up with orthopedic surgery.  Return precautions were discussed with the patient and his wife who verbalized understanding agreement the plan.        Final Clinical Impression(s) / ED Diagnoses Final diagnoses:  Fall, initial encounter  Sprain of left knee, unspecified ligament, initial encounter    Rx / DC Orders ED Discharge Orders          Ordered  oxyCODONE-acetaminophen (PERCOCET/ROXICET) 5-325 MG tablet  Every 6 hours PRN        07/10/22 1922              Sherrill Raring, [place] 07/10/22 2255    Milton Ferguson, MD 07/11/22 1209

## 2022-07-10 NOTE — Discharge Instructions (Addendum)
You are seen today in the emergency department for knee pain.  The x-ray and CT were reassuring and that there is no obvious fractures, you do have some sclerosis which is consistent with a stress injury.  This should improve on its own but you should definitely follow-up with orthopedic surgery, information above.  Please call Dr. Ruthe Mannan office to schedule follow-up in the next week.  Wear the knee immobilizer and use the walker as needed for support.  You can take the pain medicine as prescribed, try to rely more on Tylenol as able.  Return to the ED for new or concerning symptoms.

## 2022-07-12 DIAGNOSIS — N186 End stage renal disease: Secondary | ICD-10-CM | POA: Diagnosis not present

## 2022-07-12 DIAGNOSIS — Z992 Dependence on renal dialysis: Secondary | ICD-10-CM | POA: Diagnosis not present

## 2022-07-13 DIAGNOSIS — N186 End stage renal disease: Secondary | ICD-10-CM | POA: Diagnosis not present

## 2022-07-13 DIAGNOSIS — N2581 Secondary hyperparathyroidism of renal origin: Secondary | ICD-10-CM | POA: Diagnosis not present

## 2022-07-13 DIAGNOSIS — Z992 Dependence on renal dialysis: Secondary | ICD-10-CM | POA: Diagnosis not present

## 2022-07-15 ENCOUNTER — Telehealth: Payer: Self-pay

## 2022-07-15 NOTE — Telephone Encounter (Signed)
     Patient  visit on [place] Na  3/29 Have you been able to follow up with your primary care physician? Yes   The patient was or was not able to obtain any needed medicine or equipment. Yes   Are there diet recommendations that you are having difficulty following? Na   Patient expresses understanding of discharge instructions and education provided has no other needs at this time.  yes    [facility] [phone] [address], [place], [place] [address] Phone: [phone] Email: [email or web address]

## 2022-07-16 DIAGNOSIS — M25562 Pain in left knee: Secondary | ICD-10-CM | POA: Diagnosis not present

## 2022-07-17 DIAGNOSIS — N186 End stage renal disease: Secondary | ICD-10-CM | POA: Diagnosis not present

## 2022-07-17 DIAGNOSIS — Z992 Dependence on renal dialysis: Secondary | ICD-10-CM | POA: Diagnosis not present

## 2022-07-17 DIAGNOSIS — N2581 Secondary hyperparathyroidism of renal origin: Secondary | ICD-10-CM | POA: Diagnosis not present

## 2022-07-20 DIAGNOSIS — Z794 Long term (current) use of insulin: Secondary | ICD-10-CM | POA: Diagnosis not present

## 2022-07-20 DIAGNOSIS — N186 End stage renal disease: Secondary | ICD-10-CM | POA: Diagnosis not present

## 2022-07-20 DIAGNOSIS — Z992 Dependence on renal dialysis: Secondary | ICD-10-CM | POA: Diagnosis not present

## 2022-07-20 DIAGNOSIS — E1122 Type 2 diabetes mellitus with diabetic chronic kidney disease: Secondary | ICD-10-CM | POA: Diagnosis not present

## 2022-07-20 DIAGNOSIS — N2581 Secondary hyperparathyroidism of renal origin: Secondary | ICD-10-CM | POA: Diagnosis not present

## 2022-07-21 ENCOUNTER — Other Ambulatory Visit: Payer: Self-pay

## 2022-07-21 ENCOUNTER — Inpatient Hospital Stay (HOSPITAL_COMMUNITY)
Admission: EM | Admit: 2022-07-21 | Discharge: 2022-07-28 | DRG: 521 | Disposition: A | Discharge status: Skilled Nursing Facility | Payer: Medicare HMO | Attending: Family Medicine | Admitting: Family Medicine

## 2022-07-21 ENCOUNTER — Telehealth: Payer: Self-pay | Admitting: Radiology

## 2022-07-21 ENCOUNTER — Ambulatory Visit: Payer: Medicare HMO | Admitting: Orthopaedic Surgery

## 2022-07-21 ENCOUNTER — Encounter: Payer: Self-pay | Admitting: Orthopaedic Surgery

## 2022-07-21 ENCOUNTER — Other Ambulatory Visit (INDEPENDENT_AMBULATORY_CARE_PROVIDER_SITE_OTHER): Payer: Medicare HMO

## 2022-07-21 ENCOUNTER — Encounter (HOSPITAL_COMMUNITY): Payer: Self-pay

## 2022-07-21 DIAGNOSIS — R2689 Other abnormalities of gait and mobility: Secondary | ICD-10-CM

## 2022-07-21 DIAGNOSIS — S72012A Unspecified intracapsular fracture of left femur, initial encounter for closed fracture: Principal | ICD-10-CM | POA: Diagnosis present

## 2022-07-21 DIAGNOSIS — N2581 Secondary hyperparathyroidism of renal origin: Secondary | ICD-10-CM | POA: Diagnosis present

## 2022-07-21 DIAGNOSIS — Z992 Dependence on renal dialysis: Secondary | ICD-10-CM

## 2022-07-21 DIAGNOSIS — I120 Hypertensive chronic kidney disease with stage 5 chronic kidney disease or end stage renal disease: Secondary | ICD-10-CM | POA: Diagnosis present

## 2022-07-21 DIAGNOSIS — E1122 Type 2 diabetes mellitus with diabetic chronic kidney disease: Secondary | ICD-10-CM | POA: Diagnosis present

## 2022-07-21 DIAGNOSIS — Z833 Family history of diabetes mellitus: Secondary | ICD-10-CM | POA: Diagnosis not present

## 2022-07-21 DIAGNOSIS — Z7401 Bed confinement status: Secondary | ICD-10-CM | POA: Diagnosis not present

## 2022-07-21 DIAGNOSIS — E611 Iron deficiency: Secondary | ICD-10-CM | POA: Diagnosis not present

## 2022-07-21 DIAGNOSIS — S72009A Fracture of unspecified part of neck of unspecified femur, initial encounter for closed fracture: Secondary | ICD-10-CM | POA: Diagnosis present

## 2022-07-21 DIAGNOSIS — N186 End stage renal disease: Secondary | ICD-10-CM | POA: Diagnosis present

## 2022-07-21 DIAGNOSIS — Z96642 Presence of left artificial hip joint: Secondary | ICD-10-CM | POA: Diagnosis not present

## 2022-07-21 DIAGNOSIS — Z96641 Presence of right artificial hip joint: Secondary | ICD-10-CM | POA: Diagnosis present

## 2022-07-21 DIAGNOSIS — E119 Type 2 diabetes mellitus without complications: Secondary | ICD-10-CM

## 2022-07-21 DIAGNOSIS — D649 Anemia, unspecified: Secondary | ICD-10-CM | POA: Diagnosis present

## 2022-07-21 DIAGNOSIS — I10 Essential (primary) hypertension: Secondary | ICD-10-CM | POA: Diagnosis not present

## 2022-07-21 DIAGNOSIS — Z79899 Other long term (current) drug therapy: Secondary | ICD-10-CM

## 2022-07-21 DIAGNOSIS — E782 Mixed hyperlipidemia: Secondary | ICD-10-CM | POA: Diagnosis present

## 2022-07-21 DIAGNOSIS — Z993 Dependence on wheelchair: Secondary | ICD-10-CM | POA: Diagnosis not present

## 2022-07-21 DIAGNOSIS — R41841 Cognitive communication deficit: Secondary | ICD-10-CM | POA: Diagnosis not present

## 2022-07-21 DIAGNOSIS — W03XXXA Other fall on same level due to collision with another person, initial encounter: Secondary | ICD-10-CM | POA: Diagnosis present

## 2022-07-21 DIAGNOSIS — E875 Hyperkalemia: Secondary | ICD-10-CM | POA: Diagnosis not present

## 2022-07-21 DIAGNOSIS — M6281 Muscle weakness (generalized): Secondary | ICD-10-CM | POA: Diagnosis not present

## 2022-07-21 DIAGNOSIS — E785 Hyperlipidemia, unspecified: Secondary | ICD-10-CM | POA: Diagnosis not present

## 2022-07-21 DIAGNOSIS — S72002A Fracture of unspecified part of neck of left femur, initial encounter for closed fracture: Secondary | ICD-10-CM | POA: Diagnosis present

## 2022-07-21 DIAGNOSIS — M25562 Pain in left knee: Secondary | ICD-10-CM | POA: Diagnosis not present

## 2022-07-21 DIAGNOSIS — S72002D Fracture of unspecified part of neck of left femur, subsequent encounter for closed fracture with routine healing: Secondary | ICD-10-CM | POA: Diagnosis not present

## 2022-07-21 DIAGNOSIS — Z9181 History of falling: Secondary | ICD-10-CM | POA: Diagnosis not present

## 2022-07-21 DIAGNOSIS — D631 Anemia in chronic kidney disease: Secondary | ICD-10-CM | POA: Diagnosis not present

## 2022-07-21 DIAGNOSIS — Z4901 Encounter for fitting and adjustment of extracorporeal dialysis catheter: Secondary | ICD-10-CM | POA: Diagnosis not present

## 2022-07-21 DIAGNOSIS — Z471 Aftercare following joint replacement surgery: Secondary | ICD-10-CM | POA: Diagnosis not present

## 2022-07-21 DIAGNOSIS — N189 Chronic kidney disease, unspecified: Secondary | ICD-10-CM | POA: Diagnosis not present

## 2022-07-21 DIAGNOSIS — W19XXXA Unspecified fall, initial encounter: Secondary | ICD-10-CM | POA: Diagnosis not present

## 2022-07-21 LAB — BASIC METABOLIC PANEL
Anion gap: 14 (ref 5–15)
BUN: 63 mg/dL — ABNORMAL HIGH (ref 8–23)
CO2: 26 mmol/L (ref 22–32)
Calcium: 9.3 mg/dL (ref 8.9–10.3)
Chloride: 95 mmol/L — ABNORMAL LOW (ref 98–111)
Creatinine, Ser: 9.06 mg/dL — ABNORMAL HIGH (ref 0.61–1.24)
GFR, Estimated: 5 mL/min — ABNORMAL LOW (ref >60)
Glucose, Bld: 138 mg/dL — ABNORMAL HIGH (ref 70–99)
Potassium: 4.8 mmol/L (ref 3.5–5.1)
Sodium: 135 mmol/L (ref 135–145)

## 2022-07-21 LAB — CBC WITH DIFFERENTIAL/PLATELET
Abs Immature Granulocytes: 0.06 10*3/uL (ref 0.00–0.07)
Basophils Absolute: 0.1 10*3/uL (ref 0.0–0.1)
Basophils Relative: 0 %
Eosinophils Absolute: 0.2 10*3/uL (ref 0.0–0.5)
Eosinophils Relative: 2 %
HCT: 33.8 % — ABNORMAL LOW (ref 39.0–52.0)
Hemoglobin: 11.3 g/dL — ABNORMAL LOW (ref 13.0–17.0)
Immature Granulocytes: 1 %
Lymphocytes Relative: 8 %
Lymphs Abs: 1 10*3/uL (ref 0.7–4.0)
MCH: 31.4 pg (ref 26.0–34.0)
MCHC: 33.4 g/dL (ref 30.0–36.0)
MCV: 93.9 fL (ref 80.0–100.0)
Monocytes Absolute: 1.2 10*3/uL — ABNORMAL HIGH (ref 0.1–1.0)
Monocytes Relative: 10 %
Neutro Abs: 9.7 10*3/uL — ABNORMAL HIGH (ref 1.7–7.7)
Neutrophils Relative %: 79 %
Platelets: 392 10*3/uL (ref 150–400)
RBC: 3.6 MIL/uL — ABNORMAL LOW (ref 4.22–5.81)
RDW: 14 % (ref 11.5–15.5)
WBC: 12.1 10*3/uL — ABNORMAL HIGH (ref 4.0–10.5)
nRBC: 0 % (ref 0.0–0.2)

## 2022-07-21 LAB — CBG MONITORING, ED: Glucose-Capillary: 115 mg/dL — ABNORMAL HIGH (ref 70–99)

## 2022-07-21 MED ORDER — MORPHINE SULFATE (PF) 4 MG/ML IV SOLN
4.0000 mg | Freq: Once | INTRAVENOUS | Status: AC
  Administered 2022-07-21: 4 mg via INTRAVENOUS
  Filled 2022-07-21: qty 1

## 2022-07-21 MED ORDER — HYDRALAZINE HCL 20 MG/ML IJ SOLN
10.0000 mg | Freq: Four times a day (QID) | INTRAMUSCULAR | Status: DC | PRN
  Administered 2022-07-21: 10 mg via INTRAVENOUS
  Filled 2022-07-21: qty 1

## 2022-07-21 MED ORDER — ROSUVASTATIN CALCIUM 5 MG PO TABS
10.0000 mg | ORAL_TABLET | Freq: Every evening | ORAL | Status: DC
  Administered 2022-07-22 – 2022-07-27 (×5): 10 mg via ORAL
  Filled 2022-07-21 (×5): qty 2

## 2022-07-21 MED ORDER — POLYETHYLENE GLYCOL 3350 17 G PO PACK: 17.0000 g | PACK | Freq: Every day | ORAL | Status: DC | PRN

## 2022-07-21 MED ORDER — ACETAMINOPHEN 325 MG PO TABS: 650.0000 mg | ORAL_TABLET | Freq: Four times a day (QID) | ORAL | Status: DC | PRN

## 2022-07-21 MED ORDER — CARVEDILOL 12.5 MG PO TABS
12.5000 mg | ORAL_TABLET | Freq: Two times a day (BID) | ORAL | Status: DC
  Administered 2022-07-22 – 2022-07-28 (×9): 12.5 mg via ORAL
  Filled 2022-07-21 (×9): qty 1

## 2022-07-21 MED ORDER — ONDANSETRON HCL 4 MG/2ML IJ SOLN
4.0000 mg | Freq: Once | INTRAMUSCULAR | Status: AC
  Administered 2022-07-21: 4 mg via INTRAVENOUS
  Filled 2022-07-21: qty 2

## 2022-07-21 MED ORDER — ACETAMINOPHEN 650 MG RE SUPP: 650.0000 mg | Freq: Four times a day (QID) | RECTAL | Status: DC | PRN

## 2022-07-21 MED ORDER — HYDROMORPHONE HCL 1 MG/ML IJ SOLN
1.0000 mg | INTRAMUSCULAR | Status: DC | PRN
  Administered 2022-07-21 – 2022-07-27 (×7): 1 mg via INTRAVENOUS
  Filled 2022-07-21 (×7): qty 1

## 2022-07-21 NOTE — ED Notes (Signed)
ED TO INPATIENT HANDOFF REPORT  ED Nurse Name and Phone #:  Gillis EndsShellon [phone]  S Name/Age/Gender Trevor [name] 82 y.o. male Room/Bed: 046C/046C  Code Status   Code Status: Prior  Home/SNF/Other Home Patient oriented to: self, place, time, and situation Is this baseline? Yes   Triage Complete: Triage complete  Chief Complaint Closed left hip fracture [S72.002A]  Triage Note Pt arrived POV from his PCP with a left hip fx. Pt stated he fell a week ago but was told he was fine but the pain has gotten worse. Ortho called and pt was actually suppose to go to [facility]. Per Ortho pt will needed to be admitted here [facility]    Allergies No Known Allergies  Level of Care/Admitting Diagnosis ED Disposition     ED Disposition  Admit   Condition  --   Comment  [facility] [[id number]]  Level of Care: Med-Surg [16]  [facility] if equivalent level of care is available:: Yes  Covid Evaluation: Asymptomatic - no recent exposure (last 10 days) testing not required  Diagnosis: Closed left hip fracture [709190]  Admitting Physician: Katha CabalBRIMAGE, VONDRA [[id number]]  Attending Physician: Katha CabalBRIMAGE, VONDRA [[id number]]  Certification:: I certify there are rare and unusual circumstances requiring inpatient admission  Estimated Length of Stay: 3          B Medical/Surgery History Past Medical History:  Diagnosis Date   Anemia    Chronic kidney disease    Stage 4?    Diabetes mellitus without complication    Hepatitis    not sure what type - over 20 years ago   History of kidney stones    Hyperlipidemia    Hypertension    Past Surgical History:  Procedure Laterality Date   AV FISTULA PLACEMENT Left 08/09/2019   Procedure: LEFT ARM Basilic  ARTERIOVENOUS  FISTULA CREATION;  Surgeon: Maeola Harmanain, Brandon Christopher, MD;  [facility];  Service: Vascular;  Laterality: Left;   AV FISTULA PLACEMENT Left 12/17/2020    Procedure: INSERTION OF ARTERIOVENOUS (AV) GORE-TEX GRAFT LEFT ARM;  Surgeon: Maeola Harmanain, Brandon Christopher, MD;  [facility];  Service: Vascular;  Laterality: Left;   BASCILIC VEIN TRANSPOSITION Left 11/30/2019   Procedure: LEFT SECOND STAGE BASCILIC VEIN TRANSPOSITION;  Surgeon: Maeola Harmanain, Brandon Christopher, MD;  [facility];  Service: Vascular;  Laterality: Left;   COLONOSCOPY N/A 07/07/2017   Procedure: COLONOSCOPY;  Surgeon: West BaliFields, Sandi L, MD;  Location[facility];  Service: Endoscopy;  Laterality: N/A;  8:30   IR FLUORO GUIDE CV LINE RIGHT  09/06/2020   IR US GUIDE VASC ACCESS RIGHT  09/06/2020   TOTAL HIP ARTHROPLASTY Right 10/09/2020   Procedure: TOTAL HIP ARTHROPLASTY ANTERIOR APPROACH;  Surgeon: Samson FredericSwinteck, Brian, MD;  Location: [facility] OR;  Service: Orthopedics;  Laterality: Right;     A IV Location/Drains/Wounds Patient Lines/Drains/Airways Status     Active Line/Drains/Airways     Name Placement date Placement time Site Days   Peripheral IV 07/21/22 20 G 1.88" Anterior;Right Forearm 07/21/22  1626  Forearm  less than 1   Fistula / Graft Left Upper arm Arteriovenous fistula 08/09/19  0805  Upper arm  1077   Fistula / Graft Left Upper arm Arteriovenous vein graft 12/17/20  1125  Upper arm  581   Hemodialysis Catheter Right Internal jugular Double lumen Permanent (Tunneled) 09/06/20  1435  Internal jugular  683  Intake/Output Last 24 hours No intake or output data in the 24 hours ending 07/21/22 2211  Labs/Imaging Results for orders placed or performed during the hospital encounter of 07/21/22 (from the past 48 hour(s))  Basic metabolic panel     Status: Abnormal   Collection Time: 07/21/22  4:25 PM  Result Value Ref Range   Sodium 135 135 - 145 mmol/L   Potassium 4.8 3.5 - 5.1 mmol/L   Chloride 95 (L) 98 - 111 mmol/L   CO2 26 22 - 32 mmol/L   Glucose, Bld 138 (H) 70 - 99 mg/dL    Comment: Glucose reference range applies only to samples taken after fasting for  at least 8 hours.   BUN 63 (H) 8 - 23 mg/dL   Creatinine, Ser 4.85 (H) 0.61 - 1.24 mg/dL   Calcium 9.3 8.9 - 46.2 mg/dL   GFR, Estimated 5 (L) >60 mL/min    Comment: (NOTE) Calculated using the CKD-EPI Creatinine Equation (2021)    Anion gap 14 5 - 15    Comment: Performed at [facility], [address] [place], [place] [address]  CBC with Differential     Status: Abnormal   Collection Time: 07/21/22  4:25 PM  Result Value Ref Range   WBC 12.1 (H) 4.0 - 10.5 K/uL   RBC 3.60 (L) 4.22 - 5.81 MIL/uL   Hemoglobin 11.3 (L) 13.0 - 17.0 g/dL   HCT 09.3 (L) 81.8 - 29.9 %   MCV 93.9 80.0 - 100.0 fL   MCH 31.4 26.0 - 34.0 pg   MCHC 33.4 30.0 - 36.0 g/dL   RDW 37.1 69.6 - 78.9 %   Platelets 392 150 - 400 K/uL   nRBC 0.0 0.0 - 0.2 %   Neutrophils Relative % 79 %   Neutro Abs 9.7 (H) 1.7 - 7.7 K/uL   Lymphocytes Relative 8 %   Lymphs Abs 1.0 0.7 - 4.0 K/uL   Monocytes Relative 10 %   Monocytes Absolute 1.2 (H) 0.1 - 1.0 K/uL   Eosinophils Relative 2 %   Eosinophils Absolute 0.2 0.0 - 0.5 K/uL   Basophils Relative 0 %   Basophils Absolute 0.1 0.0 - 0.1 K/uL   Immature Granulocytes 1 %   Abs Immature Granulocytes 0.06 0.00 - 0.07 K/uL    Comment: Performed at [facility], [address] [place], [place] [address]  CBG monitoring, ED     Status: Abnormal   Collection Time: 07/21/22  9:31 PM  Result Value Ref Range   Glucose-Capillary 115 (H) 70 - 99 mg/dL    Comment: Glucose reference range applies only to samples taken after fasting for at least 8 hours.   DG HIP UNILAT WITH PELVIS 2-3 VIEWS LEFT  Result Date: 07/21/2022 Clinical: injury left knee and hip on 07-10-22. X-rays were done of the pelvis and left hip, three views. There is an acute subcapital fracture of the left hip displaced.  A total hip is on the right side well positioned.  Bone quality is good. Impression:  acute subcapital fracture of the left hip, displaced.  Total hip on the right. Electronically Signed  [name], MD 4/9/202411:10 AM   Pending Labs Unresulted Labs (From admission, onward)    None       Vitals/Pain Today's Vitals   07/21/22 1351 07/21/22 1407 07/21/22 1920 07/21/22 2122  BP:   (!) 173/68   Pulse:   84   Resp:   19   Temp:  98.4 F (36.9 C)  TempSrc:    Oral  SpO2:   99%   Weight: 97.1 kg     Height: 5\' 10"  (1.778 m)     PainSc:  8       Isolation Precautions No active isolations  Medications Medications  morphine (PF) 4 MG/ML injection 4 mg (4 mg Intravenous Given 07/21/22 1720)  ondansetron (ZOFRAN) injection 4 mg (4 mg Intravenous Given 07/21/22 1720)    Mobility non-ambulatory     Focused Assessments     R Recommendations: See Admitting Provider Note  Report given to:   Additional Notes:

## 2022-07-21 NOTE — Consult Note (Cosign Needed Addendum)
Reason for Consult:Left hip fx Referring Physician: Gerhard Munch Time called: 1439 Time at bedside: 1445   Trevor [name] is an 82 y.o. male.  HPI: Trevor [name] fell coming out of a laundromat about 10d ago. He c/o left leg and pelvic pain. He was seen in the ED at [facility] but according to notes only c/o knee pain (pt denies this). Knee x-rays were normal and he was sent home. He was told to see his orthopedist in a week if not better and went today. X-rays showed a left hip fx and he was directed to come to the [place] for admission.  Past Medical History:  Diagnosis Date   Anemia    Chronic kidney disease    Stage 4?    Diabetes mellitus without complication    Hepatitis    not sure what type - over 20 years ago   History of kidney stones    Hyperlipidemia    Hypertension     Past Surgical History:  Procedure Laterality Date   AV FISTULA PLACEMENT Left 08/09/2019   Procedure: LEFT ARM Basilic  ARTERIOVENOUS  FISTULA CREATION;  Surgeon: Maeola Harman, MD;  Location: [facility] OR;  Service: Vascular;  Laterality: Left;   AV FISTULA PLACEMENT Left 12/17/2020   Procedure: INSERTION OF ARTERIOVENOUS (AV) GORE-TEX GRAFT LEFT ARM;  Surgeon: Maeola Harman, MD;  Location: [facility] OR;  Service: Vascular;  Laterality: Left;   BASCILIC VEIN TRANSPOSITION Left 11/30/2019   Procedure: LEFT SECOND STAGE BASCILIC VEIN TRANSPOSITION;  Surgeon: Maeola Harman, MD;  Location: [facility] OR;  Service: Vascular;  Laterality: Left;   COLONOSCOPY N/A 07/07/2017   Procedure: COLONOSCOPY;  Surgeon: West Bali, MD;  Location: [facility] SUITE;  Service: Endoscopy;  Laterality: N/A;  8:30   IR FLUORO GUIDE CV LINE RIGHT  09/06/2020   IR US GUIDE VASC ACCESS RIGHT  09/06/2020   TOTAL HIP ARTHROPLASTY Right 10/09/2020   Procedure: TOTAL HIP ARTHROPLASTY ANTERIOR APPROACH;  Surgeon: [name], MD;  Location: [facility] OR;  Service: Orthopedics;  Laterality: Right;    Family History  Problem Relation Age of  Onset   Diabetes Mother    Diabetes Brother    Heart attack Brother    Colon cancer Son    Lung cancer Son     Social History:  reports that he has never smoked. He has never used smokeless tobacco. He reports that he does not drink alcohol and does not use drugs.  Allergies: No Known Allergies  Medications: I have reviewed the patient's current medications.  No results found for this or any previous visit (from the past 48 hour(s)).  DG HIP UNILAT WITH PELVIS 2-3 VIEWS LEFT  Result Date: 07/21/2022 Clinical: injury left knee and hip on 07-10-22. X-rays were done of the pelvis and left hip, three views. There is an acute subcapital fracture of the left hip displaced.  A total hip is on the right side well positioned.  Bone quality is good. Impression:  acute subcapital fracture of the left hip, displaced.  Total hip on the right. Electronically Signed [name], MD 4/9/202411:10 AM   Review of Systems  HENT:  Negative for ear discharge, ear pain, hearing loss and tinnitus.   Eyes:  Negative for photophobia and pain.  Respiratory:  Negative for cough and shortness of breath.   Cardiovascular:  Negative for chest pain.  Gastrointestinal:  Negative for abdominal pain, nausea and vomiting.  Genitourinary:  Negative for dysuria, flank pain, frequency and  urgency.  Musculoskeletal:  Positive for arthralgias (Left leg). Negative for back pain, myalgias and neck pain.  Neurological:  Negative for dizziness and headaches.  Hematological:  Does not bruise/bleed easily.  Psychiatric/Behavioral:  The patient is not nervous/anxious.    Blood pressure (!) 161/80, pulse 75, temperature 99.1 F (37.3 C), temperature source Oral, resp. rate 20, height 5\' 10"  (1.778 m), weight 97.1 kg, SpO2 100 %. Physical Exam Constitutional:      General: He is not in acute distress.    Appearance: He is well-developed. He is not diaphoretic.  HENT:     Head: Normocephalic and atraumatic.  Eyes:      General: No scleral icterus.       Right eye: No discharge.        Left eye: No discharge.     Conjunctiva/sclera: Conjunctivae normal.  Cardiovascular:     Rate and Rhythm: Normal rate and regular rhythm.  Pulmonary:     Effort: Pulmonary effort is normal. No respiratory distress.  Musculoskeletal:     Cervical back: Normal range of motion.     Comments: LLE No traumatic wounds, ecchymosis, or rash  Mod TTP knee and hip  No knee or ankle effusion  Knee stable to varus/ valgus and anterior/posterior stress  Sens DPN, SPN, TN intact  Motor EHL, ext, flex, evers 5/5  DP 2+, PT 1+, 1+ NP edema  Skin:    General: Skin is warm and dry.  Neurological:     Mental Status: He is alert.  Psychiatric:        Mood and Affect: Mood normal.        Behavior: Behavior normal.     Assessment/Plan: Left hip fx -- Plan THA by Dr. [name], timing to be determined. Please admit to medicine at [facility].  Agree with above note from [name]. Discussed case with both Dr. [name] and Dr. [name]. Patient is a Monday, Wednesday, Friday dialysis patient. Plan would be to transfer patient to [facility] long after dialysis on Wednesday, NPO after midnight Wednesday and plan for surgery on Thursday. Patient will need to return to [facility] for Friday dialysis. Patient may have a diet today and tomorrow.     [name], PA-C Orthopedic Surgery [phone] 07/21/2022, 2:54 PM   Concur with above assessment and plan. Have discussed this case with my partner Dr [name] who is assuming care and planning THA tomorrow. Dialysis planned for today.

## 2022-07-21 NOTE — ED Triage Notes (Signed)
Pt arrived POV from his PCP with a left hip fx. Pt stated he fell a week ago but was told he was fine but the pain has gotten worse. Ortho called and pt was actually suppose to go to [facility]. Per Ortho pt will needed to be admitted here and transferred to [facility].

## 2022-07-21 NOTE — H&P (View-Only) (Signed)
Reason for Consult:Left hip fx Referring Physician: Robert Lockwood Time called: 1439 Time at bedside: 1445   Trevor [name] is an 82 y.o. male.  HPI: Trevor [name] fell coming out of a laundromat about 10d ago. He c/o left leg and pelvic pain. He was seen in the ED at [facility] but according to notes only c/o knee pain (pt denies this). Knee x-rays were normal and he was sent home. He was told to see his orthopedist in a week if not better and went today. X-rays showed a left hip fx and he was directed to come to the [place] for admission.  Past Medical History:  Diagnosis Date   Anemia    Chronic kidney disease    Stage 4?    Diabetes mellitus without complication    Hepatitis    not sure what type - over 20 years ago   History of kidney stones    Hyperlipidemia    Hypertension     Past Surgical History:  Procedure Laterality Date   AV FISTULA PLACEMENT Left 08/09/2019   Procedure: LEFT ARM Basilic  ARTERIOVENOUS  FISTULA CREATION;  Surgeon: Cain, Brandon Christopher, MD;  Location: [facility] OR;  Service: Vascular;  Laterality: Left;   AV FISTULA PLACEMENT Left 12/17/2020   Procedure: INSERTION OF ARTERIOVENOUS (AV) GORE-TEX GRAFT LEFT ARM;  Surgeon: Cain, Brandon Christopher, MD;  Location: [facility] OR;  Service: Vascular;  Laterality: Left;   BASCILIC VEIN TRANSPOSITION Left 11/30/2019   Procedure: LEFT SECOND STAGE BASCILIC VEIN TRANSPOSITION;  Surgeon: Cain, Brandon Christopher, MD;  Location: [facility] OR;  Service: Vascular;  Laterality: Left;   COLONOSCOPY N/A 07/07/2017   Procedure: COLONOSCOPY;  Surgeon: Fields, Sandi L, MD;  [facility];  Service: Endoscopy;  Laterality: N/A;  8:30   IR FLUORO GUIDE CV LINE RIGHT  09/06/2020   IR US GUIDE VASC ACCESS RIGHT  09/06/2020   TOTAL HIP ARTHROPLASTY Right 10/09/2020   Procedure: TOTAL HIP ARTHROPLASTY ANTERIOR APPROACH;  Surgeon: Swinteck, Brian, MD;  Location: [facility] OR;  Service: Orthopedics;  Laterality: Right;    Family History  Problem Relation Age of  Onset   Diabetes Mother    Diabetes Brother    Heart attack Brother    Colon cancer Son    Lung cancer Son     Social History:  reports that he has never smoked. He has never used smokeless tobacco. He reports that he does not drink alcohol and does not use drugs.  Allergies: No Known Allergies  Medications: I have reviewed the patient's current medications.  No results found for this or any previous visit (from the past 48 hour(s)).  DG HIP UNILAT WITH PELVIS 2-3 VIEWS LEFT  Result Date: 07/21/2022 Clinical: injury left knee and hip on 07-10-22. X-rays were done of the pelvis and left hip, three views. There is an acute subcapital fracture of the left hip displaced.  A total hip is on the right side well positioned.  Bone quality is good. Impression:  acute subcapital fracture of the left hip, displaced.  Total hip on the right. Electronically Signed [name], MD 4/9/202411:10 AM   Review of Systems  HENT:  Negative for ear discharge, ear pain, hearing loss and tinnitus.   Eyes:  Negative for photophobia and pain.  Respiratory:  Negative for cough and shortness of breath.   Cardiovascular:  Negative for chest pain.  Gastrointestinal:  Negative for abdominal pain, nausea and vomiting.  Genitourinary:  Negative for dysuria, flank pain, frequency and   urgency.  Musculoskeletal:  Positive for arthralgias (Left leg). Negative for back pain, myalgias and neck pain.  Neurological:  Negative for dizziness and headaches.  Hematological:  Does not bruise/bleed easily.  Psychiatric/Behavioral:  The patient is not nervous/anxious.    Blood pressure (!) 161/80, pulse 75, temperature 99.1 F (37.3 C), temperature source Oral, resp. rate 20, height 5\' 10"  (1.778 m), weight 97.1 kg, SpO2 100 %. Physical Exam Constitutional:      General: He is not in acute distress.    Appearance: He is well-developed. He is not diaphoretic.  HENT:     Head: Normocephalic and atraumatic.  Eyes:      General: No scleral icterus.       Right eye: No discharge.        Left eye: No discharge.     Conjunctiva/sclera: Conjunctivae normal.  Cardiovascular:     Rate and Rhythm: Normal rate and regular rhythm.  Pulmonary:     Effort: Pulmonary effort is normal. No respiratory distress.  Musculoskeletal:     Cervical back: Normal range of motion.     Comments: LLE No traumatic wounds, ecchymosis, or rash  Mod TTP knee and hip  No knee or ankle effusion  Knee stable to varus/ valgus and anterior/posterior stress  Sens DPN, SPN, TN intact  Motor EHL, ext, flex, evers 5/5  DP 2+, PT 1+, 1+ NP edema  Skin:    General: Skin is warm and dry.  Neurological:     Mental Status: He is alert.  Psychiatric:        Mood and Affect: Mood normal.        Behavior: Behavior normal.     Assessment/Plan: Left hip fx -- Plan THA by Dr. [name], timing to be determined. Please admit to medicine at [facility].  Agree with above note from [name]. Discussed case with both Dr. [name] and Dr. [name]. Patient is a Monday, Wednesday, Friday dialysis patient. Plan would be to transfer patient to [facility] long after dialysis on Wednesday, NPO after midnight Wednesday and plan for surgery on Thursday. Patient will need to return to [facility] for Friday dialysis. Patient may have a diet today and tomorrow.     [name], PA-C Orthopedic Surgery [phone] 07/21/2022, 2:54 PM   Concur with above assessment and plan. Have discussed this case with my partner Dr [name] who is assuming care and planning THA tomorrow. Dialysis planned for today.

## 2022-07-21 NOTE — ED Provider Notes (Signed)
[place] EMERGENCY DEPARTMENT AT [facility] Provider Note   CSN: [id number] Arrival date & time: 07/21/22  1238     History  Chief Complaint  Patient presents with   Hip Pain    Trevor [name] is a [age] y.o. male.  With a history of ESRD, diabetes, hyperlipidemia, hypertension, hepatitis, anemia who presents to the ED for evaluation of a fall.  He initially fell approximately 1 week ago.  States he was walking out of the dry-cleaning store when a child ran in front of him causing him to fall.  He fell to his left knee and hip.  He was initially seen and had negative images of his left knee.  Did not have any images of his hip at that time.  He then followed up with orthopedics as an outpatient today and had an image of his left hip which showed a subcapital fracture with displacement.  He has been unable to bear weight on this leg since the incident.  He was initially encouraged to present to [facility] ED to be evaluated by orthopedics for surgery.  On chart review, his right THA was performed by Dr. Linna CapriceSwinteck.  Dr. Linna CapriceSwinteck is performing surgeries at [facility] tomorrow.  Will obtain basic labs, EKG and pain control and admit to hospitalist with plan to transfer to [facility].  He denies numbness, weakness, tingling, cold sensation to either foot.  He did not hit his head or lose consciousness.  Does not take blood thinners.  He dialyzes on Monday, Wednesday and Friday.  He has not missed either of his last 2 dialysis sessions.  Denies nausea, vomiting, abdominal pain.   Hip Pain       Home Medications Prior to Admission medications   Medication Sig Start Date End Date Taking? Authorizing Provider  ACCU-CHEK AVIVA PLUS test strip USE TO TEST BLOOD SUGAR BEFORE BREAKFAST AND AT BEDTIME 05/26/21   Roma KayserNida, Gebreselassie W, MD  Accu-Chek Softclix Lancets lancets  09/13/19   [provider]  acetaminophen (TYLENOL) 500 MG tablet Take 1,000 mg by mouth every 6 (six) hours  as needed for moderate pain.    [provider]  Alcohol Swabs (B-D SINGLE USE SWABS REGULAR) PADS  08/11/19   [provider]  Blood Glucose Monitoring Suppl (ONETOUCH VERIO) w/Device KIT 1 each by Does not apply route as needed. 05/03/20   Roma KayserNida, Gebreselassie W, MD  carvedilol (COREG) 12.5 MG tablet Take 12.5 mg by mouth 2 (two) times daily with a meal. 10/18/19   [provider]  hydrALAZINE (APRESOLINE) 100 MG tablet Take 50 mg by mouth 3 (three) times daily. 07/03/20   [provider]  Melatonin 10 MG TABS Take 10 mg by mouth at bedtime as needed (sleep).    [provider]  oxyCODONE (ROXICODONE) 5 MG immediate release tablet Take 1 tablet (5 mg total) by mouth every 6 (six) hours as needed. 12/17/20   Rhyne, Ames CoupeSamantha J, PA-C  oxyCODONE-acetaminophen (PERCOCET/ROXICET) 5-325 MG tablet Take 1 tablet by mouth every 6 (six) hours as needed for severe pain. 07/10/22   Theron AristaSage, Haley, PA-C  rosuvastatin (CRESTOR) 10 MG tablet Take 10 mg by mouth daily. 01/26/17   [provider]      Allergies    Patient has no known allergies.    Review of Systems   Review of Systems  Musculoskeletal:  Positive for arthralgias.  All other systems reviewed and are negative.   Physical Exam Updated Vital  Signs BP (!) 161/80 (BP Location: Right Arm)   Pulse 75   Temp 99.1 F (37.3 C) (Oral)   Resp 20   Ht 5\' 10"  (1.778 m)   Wt 97.1 kg   SpO2 100%   BMI 30.71 kg/m  Physical Exam Vitals and nursing note reviewed.  Constitutional:      General: He is not in acute distress.    Appearance: Normal appearance. He is normal weight. He is not ill-appearing.  HENT:     Head: Normocephalic and atraumatic.  Cardiovascular:     Pulses:          Dorsalis pedis pulses are 2+ on the right side and 2+ on the left side.  Pulmonary:     Effort: Pulmonary effort is normal. No respiratory distress.  Abdominal:     General: Abdomen is flat.  Musculoskeletal:         General: Normal range of motion.     Cervical back: Neck supple.     Comments: Significant TTP to the left hip.  Significant TTP with any passive ROM as well.  He is able to plantar and dorsiflex bilateral ankles without difficulty.  Skin:    General: Skin is warm and dry.     Capillary Refill: Capillary refill takes less than 2 seconds.  Neurological:     Mental Status: He is alert and oriented to person, place, and time.     Comments: Sensation intact in all toes  Psychiatric:        Mood and Affect: Mood normal.        Behavior: Behavior normal.     ED Results / Procedures / Treatments   Labs (all labs ordered are listed, but only abnormal results are displayed) Labs Reviewed  BASIC METABOLIC PANEL - Abnormal; Notable for the following components:      Result Value   Chloride 95 (*)    Glucose, Bld 138 (*)    BUN 63 (*)    Creatinine, Ser 9.06 (*)    GFR, Estimated 5 (*)    All other components within normal limits  CBC WITH DIFFERENTIAL/PLATELET - Abnormal; Notable for the following components:   WBC 12.1 (*)    RBC 3.60 (*)    Hemoglobin 11.3 (*)    HCT 33.8 (*)    Neutro Abs 9.7 (*)    Monocytes Absolute 1.2 (*)    All other components within normal limits    EKG None  Radiology DG HIP UNILAT WITH PELVIS 2-3 VIEWS LEFT  Result Date: 07/21/2022 Clinical: injury left knee and hip on 07-10-22. X-rays were done of the pelvis and left hip, three views. There is an acute subcapital fracture of the left hip displaced.  A total hip is on the right side well positioned.  Bone quality is good. Impression:  acute subcapital fracture of the left hip, displaced.  Total hip on the right. Electronically Signed [name], MD 4/9/202411:10 AM   Procedures Procedures    Medications Ordered in ED Medications  morphine (PF) 4 MG/ML injection 4 mg (4 mg Intravenous Given 07/21/22 1720)  ondansetron (ZOFRAN) injection 4 mg (4 mg Intravenous Given 07/21/22 1720)    ED Course/  Medical Decision Making/ A&P Clinical Course as of 07/21/22 1929  Tue Jul 21, 2022  1441 Spoke with Earney Hamburg with orthopedics.  He will see the patient.  States that previous hip surgery was performed by Dr. [name] who is performing surgeries at [facility] tomorrow.  Will need to be admitted to medicine and likely transfer to [facility]. [AS]  [phone] Spoke with hospitalist who will admit. [AS]    Clinical Course User Index [AS] Lula Olszewski Edsel Petrin, PA-C                             Medical Decision Making Amount and/or Complexity of Data Reviewed Labs: ordered.  Risk Prescription drug management. Decision regarding hospitalization.  This patient presents to the ED for concern of fall, left hip pain, this involves an extensive number of treatment options, and is a complaint that carries with it a high risk of complications and morbidity.  The differential diagnosis includes fracture, strain, sprain, dislocation  Co morbidities that complicate the patient evaluation  ESRD, diabetes, hyperlipidemia, hypertension, hepatitis, anemia  My initial workup includes basic labs, EKG, pain control, admission  Additional history obtained from: Nursing notes from this visit. Previous records within EMR system outpatient orthopedic office visit today which revealed an x-ray demonstrating left subcapital hip fracture  I ordered, reviewed and interpreted labs which include: BMP, CBC.  Electrolytes within normal limits.  Creatinine and BUN elevated which is to be expected given ESRD status.  Slight leukocytosis of 12.1 which is neutrophil dominant.  Anemia with a hemoglobin of 11.3  Consultations Obtained:  I requested consultation with the orthopedics Earney Hamburg,  and discussed lab and imaging findings as well as pertinent plan - they recommend: Admission to hospitalist, transfer to [facility]  Afebrile, hypertensive but otherwise hemodynamically stable.  82 year old male presenting  to the ED for evaluation of left hip fracture.  He follows with [facility].  He is an ESRD patient and dialyzes Monday, Wednesday and Friday.  He will be admitted to [facility] to undergo dialysis tomorrow with plan to transfer to [facility] to undergo left hip repair with Dr. [name].  Patient was notified of plan and is agreeable.  Stable at the time of admission.  Patient's case discussed with Dr. Lockie Mola who agrees with plan to admit.   Note: Portions of this report may have been transcribed using voice recognition software. Every effort was made to ensure accuracy; however, inadvertent computerized transcription errors may still be present.        Final Clinical Impression(s) / ED Diagnoses Final diagnoses:  Closed fracture of left hip, initial encounter    Rx / DC Orders ED Discharge Orders     None         Michelle Piper, Cordelia Poche 07/21/22 1929    Alvira Monday, MD 07/21/22 [phone]

## 2022-07-21 NOTE — Telephone Encounter (Signed)
I called to advise triage at [facility] that patient is on way with a fractured hip and he will need assistance getting out of car and on stretcher/ she voiced understanding  To you FYI

## 2022-07-21 NOTE — Progress Notes (Signed)
My knee hurts.  He hurt his left knee on 07-10-22.  He was seen in the ER and had X-rays and CT of the knee.  The CT showed: IMPRESSION: 1. Subtle subchondral sclerosis involving the bilateral tibial plateau without overlying cortical defect. Findings may represent stress-related changes. 2. Mild tricompartmental degenerative changes. 3. Mild prepatellar subcutaneous soft tissue edema.  He has continued pain of the knee and has great difficulty in standing.    Several years ago he had right knee pain but on exam I was concerned about his right hip.  X-rays showed then a fracture of the hip.  He had a total hip on the right then. He would also like his hip on the left checked out.  I have reviewed the notes from the ER and the X-rays.  I have independently reviewed and interpreted x-rays of this patient done at another site by another physician or qualified health professional.  Left hip is very painful to move.  He has great difficulty in extending the left knee secondary to pain in the left hip.  NV intact.  He appears to have some shortening on the left side as well.  With great difficulty X-rays were made of the left hip in the office today.  He has a subcapital fracture of the left hip displaced.  I have informed the patient of the results.  He had surgery prior in [place] at [facility] because of his general medical condition and renal failure.  I will contact the physician who did his prior surgery on the right side.  Encounter Diagnoses  Name Primary?   Subcapital fracture of left hip, closed, initial encounter Yes   Unable to maintain weight-bearing    Acute pain of left knee    Patient will go to [facility] ER for admission and see orthopaedics there.  Patient aware and agrees.  He was offered to go to [facility] ER across the street but said he would prefer to go straight to [facility] ER.  Total time with patient was over an hour.  Call if any problem.  Precautions  discussed.  Electronically Signed [name], MD 4/9/202411:13 AM

## 2022-07-22 ENCOUNTER — Encounter (HOSPITAL_COMMUNITY): Payer: Self-pay | Admitting: Family Medicine

## 2022-07-22 DIAGNOSIS — I10 Essential (primary) hypertension: Secondary | ICD-10-CM

## 2022-07-22 DIAGNOSIS — S72002A Fracture of unspecified part of neck of left femur, initial encounter for closed fracture: Secondary | ICD-10-CM | POA: Diagnosis not present

## 2022-07-22 DIAGNOSIS — Z992 Dependence on renal dialysis: Secondary | ICD-10-CM

## 2022-07-22 DIAGNOSIS — D631 Anemia in chronic kidney disease: Secondary | ICD-10-CM | POA: Diagnosis not present

## 2022-07-22 DIAGNOSIS — N186 End stage renal disease: Secondary | ICD-10-CM

## 2022-07-22 LAB — RENAL FUNCTION PANEL
Albumin: 2.9 g/dL — ABNORMAL LOW (ref 3.5–5.0)
Anion gap: 14 (ref 5–15)
BUN: 66 mg/dL — ABNORMAL HIGH (ref 8–23)
CO2: 20 mmol/L — ABNORMAL LOW (ref 22–32)
Calcium: 8.8 mg/dL — ABNORMAL LOW (ref 8.9–10.3)
Chloride: 99 mmol/L (ref 98–111)
Creatinine, Ser: 9.24 mg/dL — ABNORMAL HIGH (ref 0.61–1.24)
GFR, Estimated: 5 mL/min — ABNORMAL LOW (ref >60)
Glucose, Bld: 170 mg/dL — ABNORMAL HIGH (ref 70–99)
Phosphorus: 6.6 mg/dL — ABNORMAL HIGH (ref 2.5–4.6)
Potassium: 5.9 mmol/L — ABNORMAL HIGH (ref 3.5–5.1)
Sodium: 133 mmol/L — ABNORMAL LOW (ref 135–145)

## 2022-07-22 LAB — CBC
HCT: 31.4 % — ABNORMAL LOW (ref 39.0–52.0)
HCT: 30.4 % — ABNORMAL LOW (ref 39.0–52.0)
Hemoglobin: 10.4 g/dL — ABNORMAL LOW (ref 13.0–17.0)
Hemoglobin: 10 g/dL — ABNORMAL LOW (ref 13.0–17.0)
MCH: 30.7 pg (ref 26.0–34.0)
MCH: 30.5 pg (ref 26.0–34.0)
MCHC: 33.1 g/dL (ref 30.0–36.0)
MCHC: 32.9 g/dL (ref 30.0–36.0)
MCV: 92.6 fL (ref 80.0–100.0)
MCV: 92.7 fL (ref 80.0–100.0)
Platelets: 388 10*3/uL (ref 150–400)
Platelets: 370 10*3/uL (ref 150–400)
RBC: 3.39 MIL/uL — ABNORMAL LOW (ref 4.22–5.81)
RBC: 3.28 MIL/uL — ABNORMAL LOW (ref 4.22–5.81)
RDW: 14.1 % (ref 11.5–15.5)
RDW: 14.1 % (ref 11.5–15.5)
WBC: 12.7 10*3/uL — ABNORMAL HIGH (ref 4.0–10.5)
WBC: 11.2 10*3/uL — ABNORMAL HIGH (ref 4.0–10.5)
nRBC: 0 % (ref 0.0–0.2)
nRBC: 0 % (ref 0.0–0.2)

## 2022-07-22 LAB — HEPATITIS B SURFACE ANTIGEN: Hepatitis B Surface Ag: NONREACTIVE

## 2022-07-22 LAB — HEMOGLOBIN A1C
Hgb A1c MFr Bld: 5.7 % — ABNORMAL HIGH (ref 4.8–5.6)
Mean Plasma Glucose: 116.89 mg/dL

## 2022-07-22 LAB — GLUCOSE, CAPILLARY: Glucose-Capillary: 151 mg/dL — ABNORMAL HIGH (ref 70–99)

## 2022-07-22 MED ORDER — CHLORHEXIDINE GLUCONATE CLOTH 2 % EX PADS
6.0000 | MEDICATED_PAD | Freq: Every day | CUTANEOUS | Status: DC
  Administered 2022-07-22 – 2022-07-23 (×2): 6 via TOPICAL

## 2022-07-22 MED ORDER — HEPARIN SODIUM (PORCINE) 1000 UNIT/ML DIALYSIS: 1000.0000 [IU] | INTRAMUSCULAR | Status: DC | PRN

## 2022-07-22 MED ORDER — SODIUM ZIRCONIUM CYCLOSILICATE 10 G PO PACK: 10.0000 g | PACK | Freq: Once | ORAL | Status: DC

## 2022-07-22 MED ORDER — ALTEPLASE 2 MG IJ SOLR: 2.0000 mg | Freq: Once | INTRAMUSCULAR | Status: DC | PRN

## 2022-07-22 MED ORDER — ANTICOAGULANT SODIUM CITRATE 4% (200MG/5ML) IV SOLN: 5.0000 mL | Status: DC | PRN

## 2022-07-22 MED ORDER — CALCITRIOL 0.5 MCG PO CAPS
1.2500 ug | ORAL_CAPSULE | ORAL | Status: DC
  Administered 2022-07-24: 1.25 ug via ORAL
  Filled 2022-07-22 (×2): qty 1

## 2022-07-22 MED ORDER — PENTAFLUOROPROP-TETRAFLUOROETH EX AERO
INHALATION_SPRAY | CUTANEOUS | Status: AC
  Filled 2022-07-22: qty 30

## 2022-07-22 NOTE — Progress Notes (Signed)
NPO sign placed on pts door

## 2022-07-22 NOTE — Progress Notes (Signed)
Pt arrive back from HD to 6 north room 11 alert and orientated x4. Pt unable to complete HD today due to not being able to be stuck causing access issues. Lama,MD made aware. Pt is currently NPO per new order. Bed in lowest position, call light in reach. Bed alarm on. Will continue to monitor pt. All needs met at this time.

## 2022-07-22 NOTE — Progress Notes (Signed)
   07/22/22 1800  Vitals  Temp 98.2 F (36.8 C)  Pulse Rate 88  Resp 20  BP (!) 185/74  SpO2 99 %  Post Treatment  Duration of HD Treatment -hour(s) 3.25 hour(s)  Hemodialysis Intake (mL) 0 mL  Liters Processed 69.4  Fluid Removed (mL) 2000 mL  Tolerated HD Treatment Yes  AVG/AVF Arterial Site Held (minutes) 10 minutes  AVG/AVF Venous Site Held (minutes) 10 minutes   Received patient in bed to unit.  Alert and oriented.  Informed consent signed and in chart.   TX duration: 3.25hrs  Patient tolerated well.  Transported back to the room  Alert, without acute distress.  Hand-off given to patient's nurse.   Access used: LUA AVG Access issues: none  Total UF removed: 2L Medication(s) given: none    [name]

## 2022-07-22 NOTE — Progress Notes (Signed)
Patient admitted for subacute left femoral neck fracture. Currently in HD. Plan for surgery tomorrow afternoon. NPO after MN. Hold chemical DVT ppx. Type and screen ordered.

## 2022-07-22 NOTE — Procedures (Signed)
I was present at this dialysis session, have reviewed the session itself and made  appropriate changes On second cannulation attempt success --> running fine now Hemodynamically stable, tolerating UF as ordered  Estill Bakes MD [facility] pager [phone]   07/22/2022, 2:53 PM

## 2022-07-22 NOTE — Consult Note (Signed)
Chief Complaint: Dialysis access issue  Referring Physician(s): Glenna FellowsKruska  Supervising Physician: Richarda OverlieHenn, Adam  Patient Status: [facility]  History of Present Illness: Trevor [name] is a 82 y.o. male with ESRD on hemodialysis via a left upper arm graft  Per chart, it appears this was initially a brachiobasilic fistula created in 2021 then converted to a graft in 2022.  We are asked to evaluate the access because of unsuccessful treatment this morning.  Per the patient, he states "they stuck it different than they do in [place], they don't ever have any problem with it in [place]".  He is currently NPO for possible IR procedure.  Past Medical History:  Diagnosis Date   Anemia    Chronic kidney disease    Stage 4?    Diabetes mellitus without complication    Hepatitis    not sure what type - over 20 years ago   History of kidney stones    Hyperlipidemia    Hypertension     Past Surgical History:  Procedure Laterality Date   AV FISTULA PLACEMENT Left 08/09/2019   Procedure: LEFT ARM Basilic  ARTERIOVENOUS  FISTULA CREATION;  Surgeon: Maeola Harmanain, Brandon Christopher, MD;  [facility];  Service: Vascular;  Laterality: Left;   AV FISTULA PLACEMENT Left 12/17/2020   Procedure: INSERTION OF ARTERIOVENOUS (AV) GORE-TEX GRAFT LEFT ARM;  Surgeon: Maeola Harmanain, Brandon Christopher, MD;  [facility];  Service: Vascular;  Laterality: Left;   BASCILIC VEIN TRANSPOSITION Left 11/30/2019   Procedure: LEFT SECOND STAGE BASCILIC VEIN TRANSPOSITION;  Surgeon: Maeola Harmanain, Brandon Christopher, MD;  [facility];  Service: Vascular;  Laterality: Left;   COLONOSCOPY N/A 07/07/2017   Procedure: COLONOSCOPY;  Surgeon: West BaliFields, Sandi L, MD;  [facility];  Service: Endoscopy;  Laterality: N/A;  8:30   IR FLUORO GUIDE CV LINE RIGHT  09/06/2020   IR US GUIDE VASC ACCESS RIGHT  09/06/2020   TOTAL HIP ARTHROPLASTY Right 10/09/2020   Procedure: TOTAL HIP ARTHROPLASTY ANTERIOR APPROACH;  Surgeon:  Samson FredericSwinteck, Brian, MD;  Location: [facility] OR;  Service: Orthopedics;  Laterality: Right;    Allergies: Patient has no known allergies.  Medications: Prior to Admission medications   Medication Sig Start Date End Date Taking? Authorizing Provider  acetaminophen (TYLENOL) 500 MG tablet Take 1,000 mg by mouth every 6 (six) hours as needed for moderate pain.   Yes [provider]  carvedilol (COREG) 12.5 MG tablet Take 12.5 mg by mouth 2 (two) times daily with a meal. 10/18/19  Yes [provider]  hydrALAZINE (APRESOLINE) 100 MG tablet Take 50 mg by mouth 2 (two) times daily. 07/03/20  Yes [provider]  oxyCODONE-acetaminophen (PERCOCET/ROXICET) 5-325 MG tablet Take 1 tablet by mouth every 6 (six) hours as needed for severe pain. 07/10/22  Yes Theron AristaSage, Haley, PA-C  rosuvastatin (CRESTOR) 10 MG tablet Take 10 mg by mouth every evening. 01/26/17  Yes [provider]  ACCU-CHEK AVIVA PLUS test strip USE TO TEST BLOOD SUGAR BEFORE BREAKFAST AND AT BEDTIME 05/26/21   Roma KayserNida, Gebreselassie W, MD  Accu-Chek Softclix Lancets lancets  09/13/19   [provider]  Alcohol Swabs (B-D SINGLE USE SWABS REGULAR) PADS  08/11/19   [provider]  Blood Glucose Monitoring Suppl (ONETOUCH VERIO) w/Device KIT 1 each by Does not apply route as needed. 05/03/20   Roma KayserNida, Gebreselassie W, MD  oxyCODONE (ROXICODONE) 5 MG immediate release tablet Take 1 tablet (5 mg total) by mouth every 6 (six)  hours as needed. Patient not taking: Reported on 07/21/2022 12/17/20   Dara Lords, PA-C     Family History  Problem Relation Age of Onset   Diabetes Mother    Diabetes Brother    Heart attack Brother    Colon cancer Son    Lung cancer Son     Social History   Socioeconomic History   Marital status: Married    Spouse name: Not on file   Number of children: Not on file   Years of education: Not on file   Highest education level: Not on file  Occupational History   Not on file   Tobacco Use   Smoking status: Never   Smokeless tobacco: Never  Vaping Use   Vaping Use: Never used  Substance and Sexual Activity   Alcohol use: Never   Drug use: Never   Sexual activity: Not on file  Other Topics Concern   Not on file  Social History Narrative   Not on file   Social Determinants of Health   Financial Resource Strain: Not on file  Food Insecurity: Not on file  Transportation Needs: Not on file  Physical Activity: Not on file  Stress: Not on file  Social Connections: Not on file    Review of Systems  Vital Signs: BP (!) 146/58 (BP Location: Right Arm)   Pulse 67   Temp 98.5 F (36.9 C) (Oral)   Resp 17   Ht 5\' 10"  (1.778 m)   Wt 215 lb 9.8 oz (97.8 kg)   SpO2 98%   BMI 30.94 kg/m   Physical Exam Awake, alert and oriented. NAD Left arm graft with excellent bruit, good palpable thrill distal and proximal. Bandages from access this am not removed.  Imaging: DG HIP UNILAT WITH PELVIS 2-3 VIEWS LEFT  Result Date: 07/21/2022 Clinical: injury left knee and hip on 07-10-22. X-rays were done of the pelvis and left hip, three views. There is an acute subcapital fracture of the left hip displaced.  A total hip is on the right side well positioned.  Bone quality is good. Impression:  acute subcapital fracture of the left hip, displaced.  Total hip on the right. Electronically Signed [name], MD 4/9/202411:10 AM  CT Knee Left Wo Contrast  Result Date: 07/10/2022 CLINICAL DATA:  Mechanical fall with left knee pain EXAM: CT OF THE LEFT KNEE WITHOUT CONTRAST TECHNIQUE: Multidetector CT imaging of the left knee was performed according to the standard protocol. Multiplanar CT image reconstructions were also generated. RADIATION DOSE REDUCTION: This exam was performed according to the departmental dose-optimization program which includes automated exposure control, adjustment of the mA and/or kV according to patient size and/or use of iterative reconstruction  technique. COMPARISON:  Left knee radiograph dated 07/10/2018 FINDINGS: Bones/Joint/Cartilage Subtle subchondral sclerosis involving the bilateral tibial plateau without overlying cortical defect. Mild tricompartmental degenerative changes. Ligaments Suboptimally assessed by CT. Muscles and Tendons Intact. Soft tissues Mild prepatellar subcutaneous soft tissue edema. IMPRESSION: 1. Subtle subchondral sclerosis involving the bilateral tibial plateau without overlying cortical defect. Findings may represent stress-related changes. 2. Mild tricompartmental degenerative changes. 3. Mild prepatellar subcutaneous soft tissue edema. Electronically Signed   By: Agustin Cree M.D.   On: 07/10/2022 18:55   DG Knee Complete 4 Views Left  Result Date: 07/10/2022 CLINICAL DATA:  Pain after fall EXAM: LEFT KNEE - COMPLETE 4 VIEW COMPARISON:  None Available. FINDINGS: No fracture or dislocation. Preserved joint spaces and bone mineralization. Hyperostosis along the patella. No  joint effusion on lateral view. Scattered vascular calcifications. IMPRESSION: No acute osseous abnormality Electronically Signed   By: Karen Kays M.D.   On: 07/10/2022 16:59    Labs:  CBC: Recent Labs    07/21/22 1625 07/22/22 0655 07/22/22 0858  WBC 12.1* 12.7* 11.2*  HGB 11.3* 10.4* 10.0*  HCT 33.8* 31.4* 30.4*  PLT 392 388 370    COAGS: No results for input(s): "INR", "APTT" in the last 8760 hours.  BMP: Recent Labs    07/21/22 1625 07/22/22 0116  NA 135 133*  K 4.8 5.9*  CL 95* 99  CO2 26 20*  GLUCOSE 138* 170*  BUN 63* 66*  CALCIUM 9.3 8.8*  CREATININE 9.06* 9.24*  GFRNONAA 5* 5*    LIVER FUNCTION TESTS: Recent Labs    07/22/22 0116  ALBUMIN 2.9*    TUMOR MARKERS: No results for input(s): "AFPTM", "CEA", "CA199", "CHROMGRNA" in the last 8760 hours.  Assessment and Plan:  ESRD on HD via a left upper arm fistula converted to graft.  The patient states the dialysis team accessed it "different" than they  do at his outpatient dialysis clinic in [place]. He feels there is nothing wrong with it, other than "they don't know how to access it here".  Will hold off on Fistulogram and HD cath today.   Dr. Lowella Dandy recommends attempt re-access today/tomorrow by a different dialysis team member.   If unsuccessful recommend doppler study first before an invasive procedure on the fistula.  Thank you for allowing our service to participate in FRITZGERALD DECANDIA 's care.  Electronically Signed: Gwynneth Macleod, PA-C   07/22/2022, 11:17 AM      I spent a total of 20 Minutes  in face to face in clinical consultation, greater than 50% of which was counseling/coordinating care for evaluation of AV graft.

## 2022-07-22 NOTE — H&P (Addendum)
History and Physical    Patient: Trevor [name] DOB: Aug 25, 1940 DOA: 07/21/2022 DOS: the patient was seen and examined on 07/22/2022 PCP: Benita Stabile, MD  Patient coming from: Home  Chief Complaint:  Chief Complaint  Patient presents with   Hip Pain   HPI: Trevor [name] is a 82 y.o. male with medical history significant of ESRD (MWF), type 2 diabetes, hyperlipidemia, hypertension, anemia of chronic disease who presented for admission due to left hip fracture that was found at the orthopedic office today.  Patient fell at the [facility] on 07/10/2022 and was seen in the emergency department.    He did not have any imaging of his left hip at that visit.  Patient was advised to follow-up with his orthopedic provider. Patient states he typically does not have to use a cane or walker but has been in a wheelchair since his fall.  He saw orthopedic provider today who performed a left hip x-ray as he was having continued left hip pain and difficulty walking.  Left hip x-ray showed an acute fracture.    Endorses left hip and left knee pain.  Denies all other pain.  Has no other symptoms or concerns other than being hungry.  He last went to dialysis on Monday. Lives with his wife and his son.  Upon arrival to the emergency department, he was hypertensive otherwise vitals unremarkable.  Patient given morphine and Zofran in the emergency department.  ED provider consulted orthopedics who requested patient have dialysis at [facility] and transferred to [facility] for hip surgery tomorrow.  ED provider consulted the hospitalist group for admission.  Review of Systems: As mentioned in the history of present illness. All other systems reviewed and are negative. Past Medical History:  Diagnosis Date   Anemia    Chronic kidney disease    Stage 4?    Diabetes mellitus without complication    Hepatitis    not sure what type - over 20 years ago   History of kidney stones    Hyperlipidemia     Hypertension    Past Surgical History:  Procedure Laterality Date   AV FISTULA PLACEMENT Left 08/09/2019   Procedure: LEFT ARM Basilic  ARTERIOVENOUS  FISTULA CREATION;  Surgeon: Maeola Harman, MD;  Location: [facility] OR;  Service: Vascular;  Laterality: Left;   AV FISTULA PLACEMENT Left 12/17/2020   Procedure: INSERTION OF ARTERIOVENOUS (AV) GORE-TEX GRAFT LEFT ARM;  Surgeon: Maeola Harman, MD;  [facility];  Service: Vascular;  Laterality: Left;   BASCILIC VEIN TRANSPOSITION Left 11/30/2019   Procedure: LEFT SECOND STAGE BASCILIC VEIN TRANSPOSITION;  Surgeon: Maeola Harman, MD;  Location: [facility] OR;  Service: Vascular;  Laterality: Left;   COLONOSCOPY N/A 07/07/2017   Procedure: COLONOSCOPY;  Surgeon: West Bali, MD;  [facility];  Service: Endoscopy;  Laterality: N/A;  8:30   IR FLUORO GUIDE CV LINE RIGHT  09/06/2020   IR US GUIDE VASC ACCESS RIGHT  09/06/2020   TOTAL HIP ARTHROPLASTY Right 10/09/2020   Procedure: TOTAL HIP ARTHROPLASTY ANTERIOR APPROACH;  Surgeon: [name], MD;  Location: [facility] OR;  Service: Orthopedics;  Laterality: Right;   Social History:  reports that he has never smoked. He has never used smokeless tobacco. He reports that he does not drink alcohol and does not use drugs.  No Known Allergies  Family History  Problem Relation Age of Onset   Diabetes Mother    Diabetes Brother  Heart attack Brother    Colon cancer Son    Lung cancer Son     Prior to Admission medications   Medication Sig Start Date End Date Taking? Authorizing Provider  acetaminophen (TYLENOL) 500 MG tablet Take 1,000 mg by mouth every 6 (six) hours as needed for moderate pain.   Yes [provider]  carvedilol (COREG) 12.5 MG tablet Take 12.5 mg by mouth 2 (two) times daily with a meal. 10/18/19  Yes [provider]  hydrALAZINE (APRESOLINE) 100 MG tablet Take 50 mg by mouth 2 (two) times daily. 07/03/20  Yes [provider]  oxyCODONE-acetaminophen (PERCOCET/ROXICET) 5-325 MG tablet Take 1 tablet by mouth every 6 (six) hours as needed for severe pain. 07/10/22  Yes Theron Arista, PA-C  rosuvastatin (CRESTOR) 10 MG tablet Take 10 mg by mouth every evening. 01/26/17  Yes [provider]  ACCU-CHEK AVIVA PLUS test strip USE TO TEST BLOOD SUGAR BEFORE BREAKFAST AND AT BEDTIME 05/26/21   Roma Kayser, MD  Accu-Chek Softclix Lancets lancets  09/13/19   [provider]  Alcohol Swabs (B-D SINGLE USE SWABS REGULAR) PADS  08/11/19   [provider]  Blood Glucose Monitoring Suppl (ONETOUCH VERIO) w/Device KIT 1 each by Does not apply route as needed. 05/03/20   Roma Kayser, MD  oxyCODONE (ROXICODONE) 5 MG immediate release tablet Take 1 tablet (5 mg total) by mouth every 6 (six) hours as needed. Patient not taking: Reported on 07/21/2022 12/17/20   Dara Lords, PA-C    Physical Exam: Vitals:   07/21/22 2325 07/21/22 2338 07/21/22 2348 07/22/22 0018  BP: (!) 178/76 (!) 188/84 (!) 155/98 (!) 143/110  Pulse: 79  82 86  Resp:      Temp:    98.4 F (36.9 C)  TempSrc:    Oral  SpO2: 95%  99% 99%  Weight:      Height:       GEN:     alert, pleasant elderly male and no distress    HENT:  mucus membranes moist, oropharyngeal without lesions or erythema,  nares patent, no nasal discharge  EYES:   pupils equal and reactive, EOM intact NECK:  supple, normal ROM RESP:  clear to auscultation bilaterally, no increased work of breathing  CVS:   regular rate and rhythm, distal pulses intact, LUE fistula with good thrill ABD:  soft, non-tender; bowel sounds present; no palpable masses EXT:     NEURO: Difficulty moving left lower extremity with TTP and decreased ROM of left hip and knee, good range of motion bilateral ankles and right knee and hip, no edema, neurovascularly intact  SKIN:   warm and dry  Psych: Normal affect, appropriate speech and behavior    Data  Reviewed:  Relevant notes from primary care and specialist visits, past discharge summaries as available in EHR, including Care Everywhere. Prior diagnostic testing as pertinent to current admission diagnoses Updated medications and problem lists for reconciliation ED course, including vitals, labs, imaging, treatment and response to treatment Triage notes, nursing and pharmacy notes and ED provider's notes  Left hip x-ray: Acute displaced subcapital fracture of the left hip BMP: Significant for chloride 95, glucose 138, BUN 93, serum creatinine 9.06, GFR 5 CBC: WBC 12.1 with left shift, hemoglobin 11.3 CBG: 115   Assessment and Plan: Principal Problem:   Closed left hip fracture Active Problems:   Anemia   Essential hypertension   Hyperlipidemia   End stage renal disease  Left hip fracture Operation x-ray x-ray shows displaced acute subcapital fracture of the left hip.  Dr. Linna CapriceSwinteck planning for surgery for tomorrow [facility] after dialysis.  Patient will need [facility]. -Admit MedSurg -Orthopedic surgery consulted by ED -SCDs for VTE prophylaxis -Renal diet -Hydromorphone 1 mg every 4 hours as needed  Hypertension -As needed hydralazine IV -Coreg 12.5 mg twice daily  Hyperlipidemia Continue home Crestor  Anemia of chronic disease Admission hemoglobin 11.3. -Trend hemoglobin   Diet-controlled diabetes Most recent A1c appears to be 6.9 in June 2022. -AM A1c -Trend glucose with BMPs    Advance Care Planning:   Code Status: Full Code   Consults: Nephrology, orthopedic surgery  Family Communication: None  Severity of Illness: The appropriate patient status for this patient is INPATIENT. Inpatient status is judged to be reasonable and necessary in order to provide the required intensity of service to ensure the patient's safety. The patient's presenting symptoms, physical exam findings, and initial radiographic and laboratory data  in the context of their chronic comorbidities is felt to place them at high risk for further clinical deterioration. Furthermore, it is not anticipated that the patient will be medically stable for discharge from the hospital within 2 midnights of admission.   * I certify that at the point of admission it is my clinical judgment that the patient will require inpatient hospital care spanning beyond 2 midnights from the point of admission due to high intensity of service, high risk for further deterioration and high frequency of surveillance required.*  Author: Katha CabalVondra Samyra Limb, DO 07/22/2022 12:42 AM  For on call review [email or web address].

## 2022-07-22 NOTE — Progress Notes (Signed)
Pt will being going back to dialysis this afternoon per wendy Sanders,PA. Diet order also placed back in for pt to eat.

## 2022-07-22 NOTE — Progress Notes (Signed)
Pt receives out-pt HD at [facility] on MWF. Pt has a 5:45 am chair time. Will assist as needed.   Lauris Keepers Renal Navigator [phone] 

## 2022-07-22 NOTE — Progress Notes (Signed)
HD tx unable to run. Pt actually only got 13 min of run time d/t access issues. Arterial stuck fine, venous stuck twice by me where the thrill was great but got no blood return x2. Got tech to assess and she found blood but pressures were greater than 300. Turned BFR down to 300 and VP never moved. Kept alarming. Turned BFR down to 200 and VP came down to 270 but kept alarming. Discussed w/ pt if he wanted me to stick again above that  current stick, decided yes. I went to feel beside current stick and above and the pressures must have been so high that the needle dislodged. Bleeding was controlled quickly. Dr. Marisue Humble was in the unit and I made him aware of access issues and he came to assess and talk w/ pt and told him he would try to get radiology to look at it today so that he possibly can come back later today and get another try at tx. Report called to Venancio Poisson, RN

## 2022-07-22 NOTE — Progress Notes (Signed)
[facility]  PROGRESS NOTE  Trevor [name] SPQ:[id number] DOB: 10-07-1940 DOA: 07/21/2022 PCP: Benita Stabile, MD   Brief HPI:   82 year old male with medical history of ESRD, on HD MWF, diabetes mellitus type 2, hyperlipidemia, hypertension, anemia of chronic disease who presented to ED with left hip fracture that was found at orthopedic office today.  Patient fell at the [facility] on 3/29 in the ED.  At that time no imaging was obtained.  He was advised to follow-up with orthopedic surgeon.  Patient has been wheelchair since fall.  He saw orthopedic for today perform left hip x-ray as he was having continued left hip pain with difficulty walking left hip x-ray showed acute fracture. Orthopedic surgeon was consulted, recommended to get hemodialysis at [facility] and then transferred to [facility] for surgery.    Subjective   Surgery got canceled today as patient hemodialysis could not be obtained earlier due to poor HD access.  He is getting hemodialysis now.   Assessment/Plan:    Left hip fracture -X-ray showed displaced acute subcapital fracture of left hip -Plan for surgery, per Dr. [name] at [facility] -Transfer to [facility] in a.m. -Getting hemodialysis today -Keep n.p.o. after midnight, hold chemical prophylaxis  Hypertension -Continue Coreg 12.5 mg p.o. twice daily -As needed IV hydralazine  Hyperlipidemia -Continue Crestor  Hyperkalemia/ESRD -Nephrology following -Getting hemodialysis today  Anemia of chronic disease -Hemoglobin is stable at 10.0  Diet-controlled diabetes mellitus -A1c 5.7      Medications     [START ON 07/24/2022] calcitRIOL  1.25 mcg Oral Q M,W,F-HD   carvedilol  12.5 mg Oral BID WC   Chlorhexidine Gluconate Cloth  6 each Topical Daily   Chlorhexidine Gluconate Cloth  6 each Topical Q0600   pentafluoroprop-tetrafluoroeth       rosuvastatin  10 mg Oral QPM   sodium zirconium cyclosilicate  10 g Oral Once      Data Reviewed:   CBG:  Recent Labs  Lab 07/21/22 2131 07/22/22 0719  GLUCAP 115* 151*    SpO2: 96 %    Vitals:   07/22/22 1500 07/22/22 1530 07/22/22 1600 07/22/22 1630  BP: (!) 182/90 (!) 170/72 (!) 184/92 (!) 157/100  Pulse: 72 76 84 (!) 110  Resp: 18 12 14  (!) 27  Temp:      TempSrc:      SpO2: 99% 98% 100% 96%  Weight:      Height:          Data Reviewed:  Basic Metabolic Panel: Recent Labs  Lab 07/21/22 1625 07/22/22 0116  NA 135 133*  K 4.8 5.9*  CL 95* 99  CO2 26 20*  GLUCOSE 138* 170*  BUN 63* 66*  CREATININE 9.06* 9.24*  CALCIUM 9.3 8.8*  PHOS  --  6.6*    CBC: Recent Labs  Lab 07/21/22 1625 07/22/22 0655 07/22/22 0858  WBC 12.1* 12.7* 11.2*  NEUTROABS 9.7*  --   --   HGB 11.3* 10.4* 10.0*  HCT 33.8* 31.4* 30.4*  MCV 93.9 92.6 92.7  PLT 392 388 370    LFT Recent Labs  Lab 07/22/22 0116  ALBUMIN 2.9*     Antibiotics: Anti-infectives (From admission, onward)    None        DVT prophylaxis: SCDs  Code Status: Full code  Family Communication: No family at bedside   CONSULTS    Objective    Physical Examination:   General-appears in no acute distress Heart-S1-S2, regular, no  murmur auscultated Lungs-clear to auscultation bilaterally, no wheezing or crackles auscultated Abdomen-soft, nontender, no organomegaly Extremities-no edema in the lower extremities Neuro-alert, oriented x3, no focal deficit noted  Status is: Inpatient:           Meredeth Ide   [facility] If 7PM-7AM, please contact night-coverage at [email or web address], Office  [phone]   07/22/2022, 5:01 PM  LOS: 1 day

## 2022-07-22 NOTE — Consult Note (Signed)
[facility]  INPATIENT CONSULTATION  Reason for Consultation: ESRD and assoc condition co management Requesting Provider: Dr. Sharl Ma  HPI: Trevor [name] is an 82 y.o. male with ESRD on HD MWF at [facility], DM, HL, HTN currently admitted for hip fracture who is seen for evaluation and management of ESRD and assoc conditions.   Larey Seat 07/10/22 and saw ortho yesterday - X ray with hip fracture and he was directed to [facility] for ortho consultation.  Admitted to [facility] for HD at [facility] today followed by transfer to [facility] for surgery Thursday.   Seen in HD this AM -- feeling fine but issues accessing his AVF on initial attempts.  IR evaluated but feels exam ok and requests a reattempt which will be done this afternoon. Pt says no issues at HD recently.   PMH: Past Medical History:  Diagnosis Date   Anemia    Chronic kidney disease    Stage 4?    Diabetes mellitus without complication    Hepatitis    not sure what type - over 20 years ago   History of kidney stones    Hyperlipidemia    Hypertension    PSH: Past Surgical History:  Procedure Laterality Date   AV FISTULA PLACEMENT Left 08/09/2019   Procedure: LEFT ARM Basilic  ARTERIOVENOUS  FISTULA CREATION;  Surgeon: Maeola Harman, MD;  Location: [facility] OR;  Service: Vascular;  Laterality: Left;   AV FISTULA PLACEMENT Left 12/17/2020   Procedure: INSERTION OF ARTERIOVENOUS (AV) GORE-TEX GRAFT LEFT ARM;  Surgeon: Maeola Harman, MD;  Location: [facility] OR;  Service: Vascular;  Laterality: Left;   BASCILIC VEIN TRANSPOSITION Left 11/30/2019   Procedure: LEFT SECOND STAGE BASCILIC VEIN TRANSPOSITION;  Surgeon: Maeola Harman, MD;  Location: [facility] OR;  Service: Vascular;  Laterality: Left;   COLONOSCOPY N/A 07/07/2017   Procedure: COLONOSCOPY;  Surgeon: West Bali, MD;  Location: [facility] SUITE;  Service: Endoscopy;  Laterality: N/A;  8:30   IR FLUORO GUIDE CV LINE RIGHT  09/06/2020   IR US GUIDE VASC ACCESS RIGHT   09/06/2020   TOTAL HIP ARTHROPLASTY Right 10/09/2020   Procedure: TOTAL HIP ARTHROPLASTY ANTERIOR APPROACH;  Surgeon: [name], MD;  Location: [facility] OR;  Service: Orthopedics;  Laterality: Right;    Past Medical History:  Diagnosis Date   Anemia    Chronic kidney disease    Stage 4?    Diabetes mellitus without complication    Hepatitis    not sure what type - over 20 years ago   History of kidney stones    Hyperlipidemia    Hypertension     Medications:  I have reviewed the patient's current medications.  Medications Prior to Admission  Medication Sig Dispense Refill   acetaminophen (TYLENOL) 500 MG tablet Take 1,000 mg by mouth every 6 (six) hours as needed for moderate pain.     carvedilol (COREG) 12.5 MG tablet Take 12.5 mg by mouth 2 (two) times daily with a meal.     hydrALAZINE (APRESOLINE) 100 MG tablet Take 50 mg by mouth 2 (two) times daily.     oxyCODONE-acetaminophen (PERCOCET/ROXICET) 5-325 MG tablet Take 1 tablet by mouth every 6 (six) hours as needed for severe pain. 6 tablet 0   rosuvastatin (CRESTOR) 10 MG tablet Take 10 mg by mouth every evening.     ACCU-CHEK AVIVA PLUS test strip USE TO TEST BLOOD SUGAR BEFORE BREAKFAST AND AT BEDTIME 200 strip 2   Accu-Chek Softclix Lancets  lancets      Alcohol Swabs (B-D SINGLE USE SWABS REGULAR) PADS      Blood Glucose Monitoring Suppl (ONETOUCH VERIO) w/Device KIT 1 each by Does not apply route as needed. 1 kit 0   oxyCODONE (ROXICODONE) 5 MG immediate release tablet Take 1 tablet (5 mg total) by mouth every 6 (six) hours as needed. (Patient not taking: Reported on 07/21/2022) 20 tablet 0    ALLERGIES:  No Known Allergies  FAM HX: Family History  Problem Relation Age of Onset   Diabetes Mother    Diabetes Brother    Heart attack Brother    Colon cancer Son    Lung cancer Son     Social History:   reports that he has never smoked. He has never used smokeless tobacco. He reports that he does not drink alcohol and  does not use drugs.  ROS: 12 system ROS neg except per HPI   Blood pressure (!) 146/58, pulse 67, temperature 98.5 F (36.9 C), temperature source Oral, resp. rate 17, height 5\' 10"  (1.778 m), weight 97.8 kg, SpO2 98 %. PHYSICAL EXAM: Gen: lying flat comfortably  Eyes:  RRR ENT: MMM Neck: supple CV:  RRR Abd:  soft Lungs: clear ant GU: no foley Extr:  no edema, LUE AVF +t/b, dressing over top Neuro: grossly in tact   Results for orders placed or performed during the hospital encounter of 07/21/22 (from the past 48 hour(s))  Basic metabolic panel     Status: Abnormal   Collection Time: 07/21/22  4:25 PM  Result Value Ref Range   Sodium 135 135 - 145 mmol/L   Potassium 4.8 3.5 - 5.1 mmol/L   Chloride 95 (L) 98 - 111 mmol/L   CO2 26 22 - 32 mmol/L   Glucose, Bld 138 (H) 70 - 99 mg/dL    Comment: Glucose reference range applies only to samples taken after fasting for at least 8 hours.   BUN 63 (H) 8 - 23 mg/dL   Creatinine, Ser 4.82 (H) 0.61 - 1.24 mg/dL   Calcium 9.3 8.9 - 70.7 mg/dL   GFR, Estimated 5 (L) >60 mL/min    Comment: (NOTE) Calculated using the CKD-EPI Creatinine Equation (2021)    Anion gap 14 5 - 15    Comment: Performed at [facility], [address], [place], [place] [address]  CBC with Differential     Status: Abnormal   Collection Time: 07/21/22  4:25 PM  Result Value Ref Range   WBC 12.1 (H) 4.0 - 10.5 K/uL   RBC 3.60 (L) 4.22 - 5.81 MIL/uL   Hemoglobin 11.3 (L) 13.0 - 17.0 g/dL   HCT 49.2 (L) 01.0 - 07.1 %   MCV 93.9 80.0 - 100.0 fL   MCH 31.4 26.0 - 34.0 pg   MCHC 33.4 30.0 - 36.0 g/dL   RDW 21.9 75.8 - 83.2 %   Platelets 392 150 - 400 K/uL   nRBC 0.0 0.0 - 0.2 %   Neutrophils Relative % 79 %   Neutro Abs 9.7 (H) 1.7 - 7.7 K/uL   Lymphocytes Relative 8 %   Lymphs Abs 1.0 0.7 - 4.0 K/uL   Monocytes Relative 10 %   Monocytes Absolute 1.2 (H) 0.1 - 1.0 K/uL   Eosinophils Relative 2 %   Eosinophils Absolute 0.2 0.0 - 0.5 K/uL   Basophils  Relative 0 %   Basophils Absolute 0.1 0.0 - 0.1 K/uL   Immature Granulocytes 1 %  Abs Immature Granulocytes 0.06 0.00 - 0.07 K/uL    Comment: Performed at [facility], [address] [place], [place] [address]  CBG monitoring, ED     Status: Abnormal   Collection Time: 07/21/22  9:31 PM  Result Value Ref Range   Glucose-Capillary 115 (H) 70 - 99 mg/dL    Comment: Glucose reference range applies only to samples taken after fasting for at least 8 hours.  Renal function panel     Status: Abnormal   Collection Time: 07/22/22  1:16 AM  Result Value Ref Range   Sodium 133 (L) 135 - 145 mmol/L   Potassium 5.9 (H) 3.5 - 5.1 mmol/L    Comment: HEMOLYSIS AT THIS LEVEL MAY AFFECT RESULT DELTA CHECK NOTED    Chloride 99 98 - 111 mmol/L   CO2 20 (L) 22 - 32 mmol/L   Glucose, Bld 170 (H) 70 - 99 mg/dL    Comment: Glucose reference range applies only to samples taken after fasting for at least 8 hours.   BUN 66 (H) 8 - 23 mg/dL   Creatinine, Ser 6.049.24 (H) 0.61 - 1.24 mg/dL   Calcium 8.8 (L) 8.9 - 10.3 mg/dL   Phosphorus 6.6 (H) 2.5 - 4.6 mg/dL   Albumin 2.9 (L) 3.5 - 5.0 g/dL   GFR, Estimated 5 (L) >60 mL/min    Comment: (NOTE) Calculated using the CKD-EPI Creatinine Equation (2021)    Anion gap 14 5 - 15    Comment: Performed at [facility], [address] [place], [place] [address]  CBC     Status: Abnormal   Collection Time: 07/22/22  6:55 AM  Result Value Ref Range   WBC 12.7 (H) 4.0 - 10.5 K/uL   RBC 3.39 (L) 4.22 - 5.81 MIL/uL   Hemoglobin 10.4 (L) 13.0 - 17.0 g/dL   HCT 11.931.4 (L) 14.739.0 - 82.952.0 %   MCV 92.6 80.0 - 100.0 fL   MCH 30.7 26.0 - 34.0 pg   MCHC 33.1 30.0 - 36.0 g/dL   RDW 56.214.1 13.011.5 - 86.515.5 %   Platelets 388 150 - 400 K/uL   nRBC 0.0 0.0 - 0.2 %    Comment: Performed at [facility], [address] [place], [place] [address]  Hemoglobin A1c     Status: Abnormal   Collection Time: 07/22/22  6:55 AM  Result Value Ref Range   Hgb A1c MFr Bld 5.7 (H)  4.8 - 5.6 %    Comment: (NOTE) Pre diabetes:          5.7%-6.4%  Diabetes:              >6.4%  Glycemic control for   <7.0% adults with diabetes    Mean Plasma Glucose 116.89 mg/dL    Comment: Performed at [facility], [address] [place], [place] [address]  Glucose, capillary     Status: Abnormal   Collection Time: 07/22/22  7:19 AM  Result Value Ref Range   Glucose-Capillary 151 (H) 70 - 99 mg/dL    Comment: Glucose reference range applies only to samples taken after fasting for at least 8 hours.  CBC     Status: Abnormal   Collection Time: 07/22/22  8:58 AM  Result Value Ref Range   WBC 11.2 (H) 4.0 - 10.5 K/uL   RBC 3.28 (L) 4.22 - 5.81 MIL/uL   Hemoglobin 10.0 (L) 13.0 - 17.0 g/dL   HCT 84.130.4 (L) 32.439.0 -  52.0 %   MCV 92.7 80.0 - 100.0 fL   MCH 30.5 26.0 - 34.0 pg   MCHC 32.9 30.0 - 36.0 g/dL   RDW 09.8 11.9 - 14.7 %   Platelets 370 150 - 400 K/uL   nRBC 0.0 0.0 - 0.2 %    Comment: Performed at [facility], [address], [place], [place] [address]    DG HIP UNILAT WITH PELVIS 2-3 VIEWS LEFT  Result Date: 07/21/2022 Clinical: injury left knee and hip on 07-10-22. X-rays were done of the pelvis and left hip, three views. There is an acute subcapital fracture of the left hip displaced.  A total hip is on the right side well positioned.  Bone quality is good. Impression:  acute subcapital fracture of the left hip, displaced.  Total hip on the right. Electronically Signed [name], MD 4/9/202411:10 AM   4hrs, EDW 96.5kg, BFR 400, DFR 500 Heparin 500/hr Mircera 30 q2wks - last dose given 4/8; venofer 50 weekly given 4/8 Calcitriol 1.53mcg TIW  Assessment/Plan **Hip fracture: plans for OR tomorrow at [facility].  **ESRD on HD:  MWF - plans for 2nd cannulation attempt of AVF later today and if unsuccessful will get ultrasound and reconvene with IR.   Going to [facility] for surgery and will need to return to [facility] for dialysis Friday.   **Hyperkalemia: K 5.9, HD will  correct  **Anemia: very mild, had ESA earlier this week.   **Secondary hyperPTH: cont calcitriol and binder here  **HTN: cont home meds  Will follow, reach out with concerns.    Tyler Pita 07/22/2022, 1:25 PM

## 2022-07-23 ENCOUNTER — Encounter (HOSPITAL_COMMUNITY): Payer: Self-pay | Admitting: Family Medicine

## 2022-07-23 ENCOUNTER — Inpatient Hospital Stay (HOSPITAL_COMMUNITY): Payer: Medicare HMO

## 2022-07-23 ENCOUNTER — Other Ambulatory Visit: Payer: Self-pay

## 2022-07-23 ENCOUNTER — Inpatient Hospital Stay (HOSPITAL_COMMUNITY): Payer: Medicare HMO | Admitting: Certified Registered"

## 2022-07-23 ENCOUNTER — Encounter (HOSPITAL_COMMUNITY)
Admission: EM | Disposition: A | Discharge status: Skilled Nursing Facility | Payer: Self-pay | Source: Home / Self Care | Attending: Family Medicine

## 2022-07-23 DIAGNOSIS — D631 Anemia in chronic kidney disease: Secondary | ICD-10-CM | POA: Diagnosis not present

## 2022-07-23 DIAGNOSIS — S72002A Fracture of unspecified part of neck of left femur, initial encounter for closed fracture: Secondary | ICD-10-CM

## 2022-07-23 DIAGNOSIS — E1122 Type 2 diabetes mellitus with diabetic chronic kidney disease: Secondary | ICD-10-CM

## 2022-07-23 DIAGNOSIS — I120 Hypertensive chronic kidney disease with stage 5 chronic kidney disease or end stage renal disease: Secondary | ICD-10-CM

## 2022-07-23 DIAGNOSIS — N186 End stage renal disease: Secondary | ICD-10-CM

## 2022-07-23 DIAGNOSIS — I10 Essential (primary) hypertension: Secondary | ICD-10-CM | POA: Diagnosis not present

## 2022-07-23 DIAGNOSIS — Z992 Dependence on renal dialysis: Secondary | ICD-10-CM

## 2022-07-23 HISTORY — PX: TOTAL HIP ARTHROPLASTY: SHX124

## 2022-07-23 LAB — BASIC METABOLIC PANEL
Anion gap: 15 (ref 5–15)
BUN: 35 mg/dL — ABNORMAL HIGH (ref 8–23)
CO2: 26 mmol/L (ref 22–32)
Calcium: 9 mg/dL (ref 8.9–10.3)
Chloride: 94 mmol/L — ABNORMAL LOW (ref 98–111)
Creatinine, Ser: 5.89 mg/dL — ABNORMAL HIGH (ref 0.61–1.24)
GFR, Estimated: 9 mL/min — ABNORMAL LOW (ref >60)
Glucose, Bld: 217 mg/dL — ABNORMAL HIGH (ref 70–99)
Potassium: 4.4 mmol/L (ref 3.5–5.1)
Sodium: 135 mmol/L (ref 135–145)

## 2022-07-23 LAB — CBC
HCT: 34.1 % — ABNORMAL LOW (ref 39.0–52.0)
Hemoglobin: 10.9 g/dL — ABNORMAL LOW (ref 13.0–17.0)
MCH: 29.9 pg (ref 26.0–34.0)
MCHC: 32 g/dL (ref 30.0–36.0)
MCV: 93.7 fL (ref 80.0–100.0)
Platelets: 380 10*3/uL (ref 150–400)
RBC: 3.64 MIL/uL — ABNORMAL LOW (ref 4.22–5.81)
RDW: 13.9 % (ref 11.5–15.5)
WBC: 11.8 10*3/uL — ABNORMAL HIGH (ref 4.0–10.5)
nRBC: 0 % (ref 0.0–0.2)

## 2022-07-23 LAB — TYPE AND SCREEN
ABO/RH(D): O POS
Antibody Screen: NEGATIVE

## 2022-07-23 LAB — POCT I-STAT, CHEM 8
BUN: 40 mg/dL — ABNORMAL HIGH (ref 8–23)
Calcium, Ion: 1.12 mmol/L — ABNORMAL LOW (ref 1.15–1.40)
Chloride: 98 mmol/L (ref 98–111)
Creatinine, Ser: 7.7 mg/dL — ABNORMAL HIGH (ref 0.61–1.24)
Glucose, Bld: 133 mg/dL — ABNORMAL HIGH (ref 70–99)
HCT: 36 % — ABNORMAL LOW (ref 39.0–52.0)
Hemoglobin: 12.2 g/dL — ABNORMAL LOW (ref 13.0–17.0)
Potassium: 4.3 mmol/L (ref 3.5–5.1)
Sodium: 135 mmol/L (ref 135–145)
TCO2: 28 mmol/L (ref 22–32)

## 2022-07-23 LAB — GLUCOSE, CAPILLARY: Glucose-Capillary: 126 mg/dL — ABNORMAL HIGH (ref 70–99)

## 2022-07-23 LAB — HEPATITIS B SURFACE ANTIBODY, QUANTITATIVE: Hep B S AB Quant (Post): 33.9 m[IU]/mL (ref >9.9)

## 2022-07-23 LAB — SURGICAL PCR SCREEN
MRSA, PCR: NEGATIVE
Staphylococcus aureus: NEGATIVE

## 2022-07-23 SURGERY — ARTHROPLASTY, HIP, TOTAL, ANTERIOR APPROACH
Anesthesia: Spinal | Site: Hip | Laterality: Left

## 2022-07-23 MED ORDER — FENTANYL CITRATE (PF) 100 MCG/2ML IJ SOLN: 25.0000 ug | INTRAMUSCULAR | Status: DC | PRN

## 2022-07-23 MED ORDER — PROPOFOL 500 MG/50ML IV EMUL
INTRAVENOUS | Status: DC | PRN
  Administered 2022-07-23: 75 ug/kg/min via INTRAVENOUS

## 2022-07-23 MED ORDER — TRANEXAMIC ACID-NACL 1000-0.7 MG/100ML-% IV SOLN
1000.0000 mg | INTRAVENOUS | Status: AC
  Administered 2022-07-23: 1000 mg via INTRAVENOUS
  Filled 2022-07-23: qty 100

## 2022-07-23 MED ORDER — SODIUM CHLORIDE 0.9 % IV SOLN: INTRAVENOUS | Status: DC

## 2022-07-23 MED ORDER — BUPIVACAINE-EPINEPHRINE (PF) 0.5% -1:200000 IJ SOLN
INTRAMUSCULAR | Status: AC
  Filled 2022-07-23: qty 30

## 2022-07-23 MED ORDER — SODIUM CHLORIDE 0.9 % IR SOLN
Status: DC | PRN
  Administered 2022-07-23: 3000 mL

## 2022-07-23 MED ORDER — CHLORHEXIDINE GLUCONATE 4 % EX LIQD
60.0000 mL | Freq: Once | CUTANEOUS | Status: AC
  Administered 2022-07-23: 4 via TOPICAL
  Filled 2022-07-23: qty 60

## 2022-07-23 MED ORDER — ORAL CARE MOUTH RINSE: 15.0000 mL | Freq: Once | OROMUCOSAL | Status: AC

## 2022-07-23 MED ORDER — PROPOFOL 10 MG/ML IV BOLUS
INTRAVENOUS | Status: AC
  Filled 2022-07-23: qty 20

## 2022-07-23 MED ORDER — ONDANSETRON HCL 4 MG/2ML IJ SOLN: 4.0000 mg | Freq: Once | INTRAMUSCULAR | Status: DC | PRN

## 2022-07-23 MED ORDER — CHLORHEXIDINE GLUCONATE 0.12 % MT SOLN
15.0000 mL | Freq: Once | OROMUCOSAL | Status: AC
  Administered 2022-07-23: 15 mL via OROMUCOSAL
  Filled 2022-07-23: qty 15

## 2022-07-23 MED ORDER — CEFAZOLIN SODIUM-DEXTROSE 2-4 GM/100ML-% IV SOLN
2.0000 g | INTRAVENOUS | Status: AC
  Administered 2022-07-23: 2 g via INTRAVENOUS
  Filled 2022-07-23: qty 100

## 2022-07-23 MED ORDER — SODIUM CHLORIDE (PF) 0.9 % IJ SOLN
INTRAMUSCULAR | Status: DC | PRN
  Administered 2022-07-23: 61 mL via INTRAMUSCULAR

## 2022-07-23 MED ORDER — POVIDONE-IODINE 10 % EX SWAB
2.0000 | Freq: Once | CUTANEOUS | Status: AC
  Administered 2022-07-23: 2 via TOPICAL

## 2022-07-23 MED ORDER — STERILE WATER FOR IRRIGATION IR SOLN
Status: DC | PRN
  Administered 2022-07-23: 1000 mL

## 2022-07-23 MED ORDER — BUPIVACAINE IN DEXTROSE 0.75-8.25 % IT SOLN
INTRATHECAL | Status: DC | PRN
  Administered 2022-07-23: 2 mL via INTRATHECAL

## 2022-07-23 MED ORDER — IRRISEPT - 450ML BOTTLE WITH 0.05% CHG IN STERILE WATER, USP 99.95% OPTIME
TOPICAL | Status: DC | PRN
  Administered 2022-07-23: 450 mL

## 2022-07-23 MED ORDER — 0.9 % SODIUM CHLORIDE (POUR BTL) OPTIME
TOPICAL | Status: DC | PRN
  Administered 2022-07-23: 1000 mL

## 2022-07-23 MED ORDER — PHENYLEPHRINE HCL-NACL 20-0.9 MG/250ML-% IV SOLN
INTRAVENOUS | Status: DC | PRN
  Administered 2022-07-23: 25 ug/min via INTRAVENOUS

## 2022-07-23 MED ORDER — KETOROLAC TROMETHAMINE 30 MG/ML IJ SOLN
INTRAMUSCULAR | Status: AC
  Filled 2022-07-23: qty 1

## 2022-07-23 MED ORDER — ACETAMINOPHEN 10 MG/ML IV SOLN: 1000.0000 mg | Freq: Once | INTRAVENOUS | Status: DC | PRN

## 2022-07-23 MED ORDER — SODIUM CHLORIDE (PF) 0.9 % IJ SOLN
INTRAMUSCULAR | Status: AC
  Filled 2022-07-23: qty 50

## 2022-07-23 MED ORDER — PROPOFOL 10 MG/ML IV BOLUS
INTRAVENOUS | Status: DC | PRN
  Administered 2022-07-23: 30 mg via INTRAVENOUS
  Administered 2022-07-23: 20 mg via INTRAVENOUS

## 2022-07-23 MED ORDER — FENTANYL CITRATE (PF) 250 MCG/5ML IJ SOLN
INTRAMUSCULAR | Status: AC
  Filled 2022-07-23: qty 5

## 2022-07-23 MED ORDER — EPHEDRINE SULFATE (PRESSORS) 50 MG/ML IJ SOLN
INTRAMUSCULAR | Status: DC | PRN
  Administered 2022-07-23: 10 mg via INTRAVENOUS

## 2022-07-23 SURGICAL SUPPLY — 66 items
ALCOHOL 70% 16 OZ (MISCELLANEOUS) ×1 IMPLANT
BAG COUNTER SPONGE SURGICOUNT (BAG) ×1 IMPLANT
BLADE CLIPPER SURG (BLADE) IMPLANT
CHLORAPREP W/TINT 26 (MISCELLANEOUS) ×1 IMPLANT
COVER SURGICAL LIGHT HANDLE (MISCELLANEOUS) ×1 IMPLANT
DERMABOND ADVANCED .7 DNX12 (GAUZE/BANDAGES/DRESSINGS) ×2 IMPLANT
DRAPE C-ARM 42X72 X-RAY (DRAPES) ×1 IMPLANT
DRAPE STERI IOBAN 125X83 (DRAPES) ×1 IMPLANT
DRAPE U-SHAPE 47X51 STRL (DRAPES) ×3 IMPLANT
DRESSING AQUACEL AG SP 3.5X10 (GAUZE/BANDAGES/DRESSINGS) IMPLANT
DRSG AQUACEL AG ADV 3.5X10 (GAUZE/BANDAGES/DRESSINGS) ×1 IMPLANT
DRSG AQUACEL AG SP 3.5X10 (GAUZE/BANDAGES/DRESSINGS) ×1
DURAPREP 26ML APPLICATOR (WOUND CARE) IMPLANT
ELECT BLADE 4.0 EZ CLEAN MEGAD (MISCELLANEOUS) ×1
ELECT PENCIL ROCKER SW 15FT (MISCELLANEOUS) ×1 IMPLANT
ELECT REM PT RETURN 9FT ADLT (ELECTROSURGICAL) ×1
ELECTRODE BLDE 4.0 EZ CLN MEGD (MISCELLANEOUS) ×1 IMPLANT
ELECTRODE REM PT RTRN 9FT ADLT (ELECTROSURGICAL) ×1 IMPLANT
EVACUATOR 1/8 PVC DRAIN (DRAIN) IMPLANT
GLOVE BIO SURGEON STRL SZ8.5 (GLOVE) ×2 IMPLANT
GLOVE BIOGEL M 7.0 STRL (GLOVE) ×1 IMPLANT
GLOVE BIOGEL PI IND STRL 7.5 (GLOVE) ×1 IMPLANT
GLOVE BIOGEL PI IND STRL 8.5 (GLOVE) ×1 IMPLANT
GOWN STRL REUS W/ TWL LRG LVL3 (GOWN DISPOSABLE) ×2 IMPLANT
GOWN STRL REUS W/ TWL XL LVL3 (GOWN DISPOSABLE) ×1 IMPLANT
GOWN STRL REUS W/TWL 2XL LVL3 (GOWN DISPOSABLE) ×1 IMPLANT
GOWN STRL REUS W/TWL LRG LVL3 (GOWN DISPOSABLE) ×2
GOWN STRL REUS W/TWL XL LVL3 (GOWN DISPOSABLE) ×1
HANDPIECE INTERPULSE COAX TIP (DISPOSABLE) ×1
HEAD FEM -3XOFST 36XMDLR (Head) IMPLANT
HEAD MODULAR 36MM (Head) ×1 IMPLANT
HOOD W/PEELAWAY (MISCELLANEOUS) ×2 IMPLANT
JET LAVAGE IRRISEPT WOUND (IRRIGATION / IRRIGATOR) ×1
KIT BASIN OR (CUSTOM PROCEDURE TRAY) ×1 IMPLANT
KIT TURNOVER KIT B (KITS) ×1 IMPLANT
LAVAGE JET IRRISEPT WOUND (IRRIGATION / IRRIGATOR) ×1 IMPLANT
LINER ACETAB VIT E +5 36 F (Liner) IMPLANT
MANIFOLD NEPTUNE II (INSTRUMENTS) ×1 IMPLANT
MARKER SKIN DUAL TIP RULER LAB (MISCELLANEOUS) ×2 IMPLANT
NDL SPNL 18GX3.5 QUINCKE PK (NEEDLE) ×1 IMPLANT
NEEDLE SPNL 18GX3.5 QUINCKE PK (NEEDLE) ×1 IMPLANT
NS IRRIG 1000ML POUR BTL (IV SOLUTION) ×1 IMPLANT
PACK TOTAL JOINT (CUSTOM PROCEDURE TRAY) ×1 IMPLANT
PACK UNIVERSAL I (CUSTOM PROCEDURE TRAY) ×1 IMPLANT
PAD ARMBOARD 7.5X6 YLW CONV (MISCELLANEOUS) ×2 IMPLANT
SAW OSC TIP CART 19.5X105X1.3 (SAW) ×1 IMPLANT
SEALER BIPOLAR AQUA 6.0 (INSTRUMENTS) IMPLANT
SET HNDPC FAN SPRY TIP SCT (DISPOSABLE) ×1 IMPLANT
SHELL ACET G7 4H 56 SZF (Shell) IMPLANT
SOL PREP POV-IOD 4OZ 10% (MISCELLANEOUS) ×1 IMPLANT
STAPLER VISISTAT 35W (STAPLE) IMPLANT
STEM FEM CMTL 15X150 133D (Stem) IMPLANT
SUT ETHIBOND NAB CT1 #1 30IN (SUTURE) IMPLANT
SUT MNCRL AB 3-0 PS2 18 (SUTURE) IMPLANT
SUT MON AB 2-0 CT1 36 (SUTURE) ×1 IMPLANT
SUT VIC AB 2-0 CT1 27 (SUTURE) ×2
SUT VIC AB 2-0 CT1 TAPERPNT 27 (SUTURE) ×1 IMPLANT
SUT VLOC 180 0 24IN GS25 (SUTURE) ×1 IMPLANT
SYR 50ML LL SCALE MARK (SYRINGE) ×1 IMPLANT
TOWEL GREEN STERILE (TOWEL DISPOSABLE) ×1 IMPLANT
TOWEL GREEN STERILE FF (TOWEL DISPOSABLE) ×1 IMPLANT
TRAY CATH INTERMITTENT SS 16FR (CATHETERS) IMPLANT
TRAY FOLEY W/BAG SLVR 16FR (SET/KITS/TRAYS/PACK)
TRAY FOLEY W/BAG SLVR 16FR ST (SET/KITS/TRAYS/PACK) IMPLANT
TUBE SUCT ARGYLE STRL (TUBING) ×1 IMPLANT
WATER STERILE IRR 1000ML POUR (IV SOLUTION) ×3 IMPLANT

## 2022-07-23 NOTE — Anesthesia Procedure Notes (Signed)
Spinal  Patient location during procedure: OR Start time: 07/23/2022 5:55 PM End time: 07/23/2022 6:00 PM Reason for block: surgical anesthesia Staffing Performed: anesthesiologist  Anesthesiologist: Leonides Grills, MD Performed by: Leonides Grills, MD Authorized by: Atilano Median, DO   Preanesthetic Checklist Completed: patient identified, IV checked, risks and benefits discussed, surgical consent, monitors and equipment checked, pre-op evaluation and timeout performed Spinal Block Patient position: left lateral decubitus Prep: DuraPrep Patient monitoring: cardiac monitor, continuous pulse ox and blood pressure Approach: midline Location: L4-5 Injection technique: single-shot Needle Needle type: Pencan  Needle gauge: 24 G Needle length: 9 cm Assessment Sensory level: T10 Events: CSF return Additional Notes Functioning IV was confirmed and monitors were applied. Sterile prep and drape, including hand hygiene and sterile gloves were used. The patient was positioned and the spine was prepped. The skin was anesthetized with lidocaine.  Free flow of clear CSF was obtained prior to injecting local anesthetic into the CSF.  The spinal needle aspirated freely following injection.  The needle was carefully withdrawn.  The patient tolerated the procedure well.

## 2022-07-23 NOTE — Anesthesia Postprocedure Evaluation (Signed)
Anesthesia Post Note  Patient: RASHEED KARGES  Procedure(s) Performed: TOTAL HIP ARTHROPLASTY ANTERIOR APPROACH (Left: Hip)     Patient location during evaluation: PACU Anesthesia Type: Spinal and MAC Level of consciousness: awake and alert Pain management: pain level controlled Vital Signs Assessment: post-procedure vital signs reviewed and stable Respiratory status: spontaneous breathing, nonlabored ventilation, respiratory function stable and patient connected to nasal cannula oxygen Cardiovascular status: stable and blood pressure returned to baseline Postop Assessment: no apparent nausea or vomiting Anesthetic complications: no   No notable events documented.  Last Vitals:  Vitals:   07/23/22 2115 07/23/22 2152  BP: (!) 160/67 (!) 149/69  Pulse: 62 68  Resp: 12 18  Temp: 36.8 C 36.8 C  SpO2: 100% 100%    Last Pain:  Vitals:   07/23/22 2152  TempSrc: Oral  PainSc:                  Nelle Don Capricia Serda

## 2022-07-23 NOTE — Interval H&P Note (Signed)
History and Physical Interval Note:  07/23/2022 5:33 PM  Trevor [name]  has presented today for surgery, with the diagnosis of closed fracture of left hip.  The various methods of treatment have been discussed with the patient and family. After consideration of risks, benefits and other options for treatment, the patient has consented to  Procedure(s): TOTAL HIP ARTHROPLASTY ANTERIOR APPROACH (Left) as a surgical intervention.  The patient's history has been reviewed, patient examined, no change in status, stable for surgery.  I have reviewed the patient's chart and labs.  Questions were answered to the patient's satisfaction.     [name]

## 2022-07-23 NOTE — Progress Notes (Signed)
[facility] Progress Note   Subjective:   no new issues.  NPO for surgery at [facility] for broken hip today.  HD went fine yesterday.   Objective Vitals:   07/23/22 0206 07/23/22 0551 07/23/22 0838 07/23/22 0840  BP: (!) 150/110 (!) 179/83 136/84 136/84  Pulse: 81 83 80 81  Resp: 17 17  16   Temp: 98.2 F (36.8 C) 99 F (37.2 C)  99 F (37.2 C)  TempSrc: Oral Oral  Oral  SpO2: 97% 100%  96%  Weight:      Height:       Physical Exam Gen: lying flat comfortably  Eyes:  RRR ENT: MMM Neck: supple CV:  RRR Abd:  soft Lungs: clear ant GU: no foley Extr:  no edema, LUE AVF +t/b Neuro: grossly in tact  Additional Objective Labs: Basic Metabolic Panel: Recent Labs  Lab 07/21/22 1625 07/22/22 0116 07/23/22 0058  NA 135 133* 135  K 4.8 5.9* 4.4  CL 95* 99 94*  CO2 26 20* 26  GLUCOSE 138* 170* 217*  BUN 63* 66* 35*  CREATININE 9.06* 9.24* 5.89*  CALCIUM 9.3 8.8* 9.0  PHOS  --  6.6*  --    Liver Function Tests: Recent Labs  Lab 07/22/22 0116  ALBUMIN 2.9*   No results for input(s): "LIPASE", "AMYLASE" in the last 168 hours. CBC: Recent Labs  Lab 07/21/22 1625 07/22/22 0655 07/22/22 0858 07/23/22 0058  WBC 12.1* 12.7* 11.2* 11.8*  NEUTROABS 9.7*  --   --   --   HGB 11.3* 10.4* 10.0* 10.9*  HCT 33.8* 31.4* 30.4* 34.1*  MCV 93.9 92.6 92.7 93.7  PLT 392 388 370 380   Blood Culture    Component Value Date/Time   SDES BLOOD RIGHT HAND 10/23/2020 1801   SDES SITE NOT SPECIFIED 10/23/2020 1801   SPECREQUEST  10/23/2020 1801    BOTTLES DRAWN AEROBIC AND ANAEROBIC Blood Culture adequate volume   SPECREQUEST  10/23/2020 1801    BOTTLES DRAWN AEROBIC AND ANAEROBIC Blood Culture results may not be optimal due to an inadequate volume of blood received in culture bottles   CULT  10/23/2020 1801    NO GROWTH 5 DAYS Performed at [facility], [address], [place], [place] [address]    CULT  10/23/2020 1801    NO GROWTH 5 DAYS Performed at [facility], [address], [place], [place] [address]    REPTSTATUS 10/28/2020 FINAL 10/23/2020 1801   REPTSTATUS 10/28/2020 FINAL 10/23/2020 1801    Cardiac Enzymes: No results for input(s): "CKTOTAL", "CKMB", "CKMBINDEX", "TROPONINI" in the last 168 hours. CBG: Recent Labs  Lab 07/21/22 2131 07/22/22 0719  GLUCAP 115* 151*   Iron Studies: No results for input(s): "IRON", "TIBC", "TRANSFERRIN", "FERRITIN" in the last 72 hours. @lablastinr3 @ Studies/Results: No results found. Medications:   ceFAZolin (ANCEF) IV     tranexamic acid      [START ON 07/24/2022] calcitRIOL  1.25 mcg Oral Q M,W,F-HD   carvedilol  12.5 mg Oral BID WC   Chlorhexidine Gluconate Cloth  6 each Topical Daily   Chlorhexidine Gluconate Cloth  6 each Topical Q0600   rosuvastatin  10 mg Oral QPM   sodium zirconium cyclosilicate  10 g Oral Once    Dialysis Orders:  4hrs, EDW 96.5kg, BFR 400, DFR 500 Heparin 500/hr Mircera 30 q2wks - last dose given 4/8; venofer 50 weekly given 4/8 Calcitriol 1.75mcg TIW   Assessment/Plan **Hip fracture: plans for OR today at [facility].   **  ESRD on HD:  MWF - initial difficulty cannulating but on reattempt was successfully cannulated on 4/10.   Going to [facility] for surgery and will need to return to [facility] for dialysis Friday.    **Hyperkalemia: Kcorrected with HD   **Anemia: very mild, had ESA earlier this week.    **Secondary hyperPTH: cont calcitriol and binder here   **HTN: cont home meds   Will follow, reach out with concerns.    Estill Bakes MD 07/23/2022, 12:03 PM  [facility] Pager: [phone]

## 2022-07-23 NOTE — Progress Notes (Signed)
Dilaudid IV given per pt request

## 2022-07-23 NOTE — Progress Notes (Signed)
[facility]  PROGRESS NOTE  HUDSEN SECHLER ZOX:[id number] DOB: 1940/08/21 DOA: 07/21/2022 PCP: Benita Stabile, MD   Brief HPI:   82 year old male with medical history of ESRD, on HD MWF, diabetes mellitus type 2, hyperlipidemia, hypertension, anemia of chronic disease who presented to ED with left hip fracture that was found at orthopedic office today.  Patient fell at the [facility] on 3/29 in the ED.  At that time no imaging was obtained.  He was advised to follow-up with orthopedic surgeon.  Patient has been wheelchair since fall.  He saw orthopedic for today perform left hip x-ray as he was having continued left hip pain with difficulty walking left hip x-ray showed acute fracture. Orthopedic surgeon was consulted, recommended to get hemodialysis at [facility] and then transferred to [facility] long for surgery.    Subjective   Patient seen and examined, upset that surgery got canceled yesterday.  Denies any complaints.   Assessment/Plan:    Left hip fracture -X-ray showed displaced acute subcapital fracture of left hip -Initial plan was to get surgery at [facility] long after hemodialysis -hemodialysis got delayed yesterday due to poor access issues -Plan for surgery today at [facility]    Hypertension -Continue Coreg 12.5 mg p.o. twice daily -As needed IV hydralazine  Hyperlipidemia -Continue Crestor  Hyperkalemia/ESRD -Nephrology following -Got hemodialysis yesterday  Anemia of chronic disease -Hemoglobin is stable at 10.0  Diet-controlled diabetes mellitus -A1c 5.7      Medications     [START ON 07/24/2022] calcitRIOL  1.25 mcg Oral Q M,W,F-HD   carvedilol  12.5 mg Oral BID WC   Chlorhexidine Gluconate Cloth  6 each Topical Daily   Chlorhexidine Gluconate Cloth  6 each Topical Q0600   rosuvastatin  10 mg Oral QPM   sodium zirconium cyclosilicate  10 g Oral Once     Data Reviewed:   CBG:  Recent Labs  Lab 07/21/22 2131 07/22/22 0719  GLUCAP 115* 151*     SpO2: 96 %    Vitals:   07/23/22 0206 07/23/22 0551 07/23/22 0838 07/23/22 0840  BP: (!) 150/110 (!) 179/83 136/84 136/84  Pulse: 81 83 80 81  Resp: 17 17  16   Temp: 98.2 F (36.8 C) 99 F (37.2 C)  99 F (37.2 C)  TempSrc: Oral Oral  Oral  SpO2: 97% 100%  96%  Weight:      Height:          Data Reviewed:  Basic Metabolic Panel: Recent Labs  Lab 07/21/22 1625 07/22/22 0116 07/23/22 0058  NA 135 133* 135  K 4.8 5.9* 4.4  CL 95* 99 94*  CO2 26 20* 26  GLUCOSE 138* 170* 217*  BUN 63* 66* 35*  CREATININE 9.06* 9.24* 5.89*  CALCIUM 9.3 8.8* 9.0  PHOS  --  6.6*  --     CBC: Recent Labs  Lab 07/21/22 1625 07/22/22 0655 07/22/22 0858 07/23/22 0058  WBC 12.1* 12.7* 11.2* 11.8*  NEUTROABS 9.7*  --   --   --   HGB 11.3* 10.4* 10.0* 10.9*  HCT 33.8* 31.4* 30.4* 34.1*  MCV 93.9 92.6 92.7 93.7  PLT 392 388 370 380    LFT Recent Labs  Lab 07/22/22 0116  ALBUMIN 2.9*     Antibiotics: Anti-infectives (From admission, onward)    Start     Dose/Rate Route Frequency Ordered Stop   07/23/22 1500  ceFAZolin (ANCEF) IVPB 2g/100 mL premix        2 g 200  mL/hr over 30 Minutes Intravenous On call to O.R. 07/23/22 0115 07/24/22 0559        DVT prophylaxis: SCDs  Code Status: Full code  Family Communication: No family at bedside   CONSULTS    Objective    Physical Examination:  Appears in no acute distress S1-S2, regular, no murmur auscultated Lungs clear to auscultation bilaterally Extremities no edema  Status is: Inpatient:           Meredeth Ide   [facility] If 7PM-7AM, please contact night-coverage at [email or web address], Office  [phone]   07/23/2022, 2:40 PM  LOS: 2 days

## 2022-07-23 NOTE — Op Note (Signed)
OPERATIVE REPORT  SURGEON: [name], MD   ASSISTANT: Clint Bolder, PA-C  PREOPERATIVE DIAGNOSIS: Displaced Left femoral neck fracture.   POSTOPERATIVE DIAGNOSIS: Displaced Left femoral neck fracture.   PROCEDURE: Left total hip arthroplasty, anterior approach.   IMPLANTS: Biomet Taperloc Reduced Distal stem, size 15 x 150 mm, high offset. Biomet G7 OsseoTi Cup, size 56 mm. Biomet Vivacit-E liner, size 36 mm, F, +5 mm neutral. Biomet metal head ball, size 36 - 3 mm.  ANESTHESIA:  General  ANTIBIOTICS: 2g ancef.  ESTIMATED BLOOD LOSS: 250 mL    DRAINS: None.  COMPLICATIONS: None   CONDITION: PACU - hemodynamically stable.   BRIEF CLINICAL NOTE: Trevor [name] is a 82 y.o. male with a displaced Left femoral neck fracture. The patient was admitted to the hospitalist service and underwent perioperative risk stratification and medical optimization. The risks, benefits, and alternatives to total hip arthroplasty were explained, and the patient elected to proceed.  PROCEDURE IN DETAIL: The patient was taken to the operating room and general anesthesia was induced on the hospital bed.  The patient was then positioned on the Hana table.  All bony prominences were well padded.  The hip was prepped and draped in the normal sterile surgical fashion.  A time-out was called verifying side and site of surgery. Antibiotics were given within 60 minutes of beginning the procedure.   Bikini incision was made, and the direct anterior approach to the hip was performed through the Hueter interval.  Lateral femoral circumflex vessels were treated with the Auqumantys. The anterior capsule was exposed and an inverted T capsulotomy was made.  Fracture hematoma was encountered and evacuated. The patient was found to have a comminuted Left subcapital femoral neck fracture.  I freshened the femoral neck cut with a saw.  I removed the femoral neck fragment.  A corkscrew was placed into the head and the head  was removed.  This was passed to the back table and was measured. The pubofemoral ligament was released subperiosteally to the lesser trochanter.  Acetabular exposure was achieved, and the pulvinar and labrum were excised. Sequential reaming of the acetabulum was then performed up to a size 55 mm reamer under direct visulization. A 56 mm cup was then opened and impacted into place at approximately 40 degrees of abduction and 20 degrees of anteversion. The final polyethylene liner was impacted into place and acetabular osteophytes were removed.    I then gained femoral exposure taking care to protect the abductors and greater trochanter.  This was performed using standard external rotation, extension, and adduction.  A cookie cutter was used to enter the femoral canal, and then the femoral canal finder was placed.  Sequential broaching was performed up to a size 15.  Calcar planer was used on the femoral neck remnant.  I placed a high offset neck and a trial head ball.  The hip was reduced.  Leg lengths and offset were checked fluoroscopically.  The hip was dislocated and trial components were removed.  The final implants were placed, and the hip was reduced.  Fluoroscopy was used to confirm component position and leg lengths.  At 90 degrees of external rotation and full extension, the hip was stable to an anterior directed force.   The wound was copiously irrigated with Irrisept solution and normal saline using pulse lavage.  Marcaine solution was injected into the periarticular soft tissue.  The wound was closed in layers using #1 Stratafix for the fascia, 2-0 Vicryl for the subcutaneous  fat, 2-0 Monocryl for the deep dermal layer, and staples + Dermabond for the skin.  Once the glue was fully dried, an Aquacell Ag dressing was applied.  The patient was transported to the recovery room in stable condition.  Sponge, needle, and instrument counts were correct at the end of the case x2.  The patient tolerated  the procedure well and there were no known complications.  The aquamantis was utilized for this case to help facilitate better hemostasis as patient was felt to be at increased risk of bleeding because of preop anemia and complex case requiring increased OR time and/or exposure.  A oscillating saw tip was utilized for this case to prevent damage to the soft tissue structures such as muscles, ligaments and tendons, and to ensure accurate bone cuts. This patient was at increased risk for above structures due to  minimally invasive approach.  Please note that a surgical assistant was a medical necessity for this procedure to perform it in a safe and expeditious manner. Assistant was necessary to provide appropriate retraction of vital neurovascular structures, to prevent femoral fracture, and to allow for anatomic placement of the prosthesis.

## 2022-07-23 NOTE — OR Nursing (Signed)
Care of patient assumed at 1912. 

## 2022-07-23 NOTE — Progress Notes (Signed)
RN informed the pt about upcoming surgery today at 3:46 pm and obtain consent for the procedure. However, Pt stated that he wants to talk to the doctor first before he sign the consent.

## 2022-07-23 NOTE — Progress Notes (Signed)
Report called to short stay. 

## 2022-07-23 NOTE — Progress Notes (Signed)
Pt requsting to speak to MD this morning. Lama,MD made aware

## 2022-07-23 NOTE — Progress Notes (Signed)
Patient transferred to short stay via bed by transportation staff 

## 2022-07-23 NOTE — Anesthesia Preprocedure Evaluation (Addendum)
Anesthesia Evaluation  Patient identified by MRN, date of birth, ID band Patient awake    Reviewed: Allergy & Precautions, NPO status , Patient's Chart, lab work & pertinent test results  Airway Mallampati: II  TM Distance: >3 FB Neck ROM: Full    Dental  (+) Missing   Pulmonary neg pulmonary ROS   Pulmonary exam normal        Cardiovascular hypertension, Pt. on home beta blockers and Pt. on medications Normal cardiovascular exam     Neuro/Psych negative neurological ROS  negative psych ROS   GI/Hepatic negative GI ROS, Neg liver ROS,,,  Endo/Other  diabetes    Renal/GU ESRF and DialysisRenal disease     Musculoskeletal negative musculoskeletal ROS (+)    Abdominal  (+) + obese  Peds  Hematology  (+) Blood dyscrasia, anemia   Anesthesia Other Findings closed fracture of left hip  Reproductive/Obstetrics                             Anesthesia Physical Anesthesia Plan  ASA: 3  Anesthesia Plan: Spinal   Post-op Pain Management:    Induction:   PONV Risk Score and Plan: 1 and Ondansetron, Dexamethasone, Propofol infusion and Treatment may vary due to age or medical condition  Airway Management Planned: Simple Face Mask  Additional Equipment:   Intra-op Plan:   Post-operative Plan:   Informed Consent: I have reviewed the patients History and Physical, chart, labs and discussed the procedure including the risks, benefits and alternatives for the proposed anesthesia with the patient or authorized representative who has indicated his/her understanding and acceptance.     Dental advisory given  Plan Discussed with: CRNA  Anesthesia Plan Comments:        Anesthesia Quick Evaluation

## 2022-07-23 NOTE — Progress Notes (Signed)
2126 Report and care received from PACU. Pt sleepy but alert and oriented 4 answers questions and is aware of surgery. NAD at this time. Family has been notified that surgery is completed and Pt has arrived back to the floor.

## 2022-07-23 NOTE — Care Management Important Message (Signed)
Important Message  Patient Details  Name: Trevor [name] MRN: [id number] Date of Birth: Jul 12, 1940   Medicare Important Message Given:  Yes     Sherilyn Banker 07/23/2022, 2:04 PM

## 2022-07-23 NOTE — Progress Notes (Signed)
Partials removed from mouth and left in pt room

## 2022-07-23 NOTE — Transfer of Care (Signed)
Immediate Anesthesia Transfer of Care Note  Patient: Trevor [name]  Procedure(s) Performed: TOTAL HIP ARTHROPLASTY ANTERIOR APPROACH (Left: Hip)  Patient Location: PACU  Anesthesia Type:MAC and Spinal  Level of Consciousness: awake and drowsy  Airway & Oxygen Therapy: Patient Spontanous Breathing  Post-op Assessment: Report given to RN and Post -op Vital signs reviewed and stable  Post vital signs: Reviewed and stable  Last Vitals:  Vitals Value Taken Time  BP 123/55 07/23/22 2030  Temp 36.6 C 07/23/22 2008  Pulse 63 07/23/22 2033  Resp 12 07/23/22 2033  SpO2 100 % 07/23/22 2033  Vitals shown include unvalidated device data.  Last Pain:  Vitals:   07/23/22 2008  TempSrc:   PainSc: Asleep         Complications: No notable events documented.

## 2022-07-23 NOTE — Progress Notes (Signed)
Surgery consent for hip arthroplasty signed and placed in pt chart.

## 2022-07-24 ENCOUNTER — Encounter (HOSPITAL_COMMUNITY): Payer: Self-pay | Admitting: Orthopedic Surgery

## 2022-07-24 DIAGNOSIS — S72002A Fracture of unspecified part of neck of left femur, initial encounter for closed fracture: Secondary | ICD-10-CM | POA: Diagnosis not present

## 2022-07-24 DIAGNOSIS — N186 End stage renal disease: Secondary | ICD-10-CM | POA: Diagnosis not present

## 2022-07-24 DIAGNOSIS — D631 Anemia in chronic kidney disease: Secondary | ICD-10-CM | POA: Diagnosis not present

## 2022-07-24 DIAGNOSIS — I10 Essential (primary) hypertension: Secondary | ICD-10-CM | POA: Diagnosis not present

## 2022-07-24 MED ORDER — ASPIRIN 81 MG PO CHEW
81.0000 mg | CHEWABLE_TABLET | Freq: Two times a day (BID) | ORAL | Status: DC
  Administered 2022-07-24 – 2022-07-28 (×9): 81 mg via ORAL
  Filled 2022-07-24 (×9): qty 1

## 2022-07-24 MED ORDER — METHOCARBAMOL 1000 MG/10ML IJ SOLN: 500.0000 mg | Freq: Four times a day (QID) | INTRAVENOUS | Status: DC | PRN

## 2022-07-24 MED ORDER — ONDANSETRON HCL 4 MG/2ML IJ SOLN
4.0000 mg | Freq: Four times a day (QID) | INTRAMUSCULAR | Status: DC | PRN
  Administered 2022-07-27: 4 mg via INTRAVENOUS
  Filled 2022-07-24: qty 2

## 2022-07-24 MED ORDER — ACETAMINOPHEN 325 MG PO TABS: 325.0000 mg | ORAL_TABLET | Freq: Four times a day (QID) | ORAL | Status: DC | PRN

## 2022-07-24 MED ORDER — MENTHOL 3 MG MT LOZG: 1.0000 | LOZENGE | OROMUCOSAL | Status: DC | PRN

## 2022-07-24 MED ORDER — DIPHENHYDRAMINE HCL 12.5 MG/5ML PO ELIX: 12.5000 mg | ORAL_SOLUTION | ORAL | Status: DC | PRN

## 2022-07-24 MED ORDER — ALUM & MAG HYDROXIDE-SIMETH 200-200-20 MG/5ML PO SUSP: 30.0000 mL | ORAL | Status: DC | PRN

## 2022-07-24 MED ORDER — OXYCODONE HCL 5 MG PO TABS
5.0000 mg | ORAL_TABLET | ORAL | Status: DC | PRN
  Administered 2022-07-26 – 2022-07-28 (×6): 10 mg via ORAL
  Filled 2022-07-24 (×4): qty 2

## 2022-07-24 MED ORDER — ONDANSETRON HCL 4 MG PO TABS: 4.0000 mg | ORAL_TABLET | Freq: Four times a day (QID) | ORAL | Status: DC | PRN

## 2022-07-24 MED ORDER — METOCLOPRAMIDE HCL 5 MG PO TABS: 5.0000 mg | ORAL_TABLET | Freq: Three times a day (TID) | ORAL | Status: DC | PRN

## 2022-07-24 MED ORDER — PHENOL 1.4 % MT LIQD: 1.0000 | OROMUCOSAL | Status: DC | PRN

## 2022-07-24 MED ORDER — OXYCODONE HCL 5 MG PO TABS
10.0000 mg | ORAL_TABLET | ORAL | Status: DC | PRN
  Administered 2022-07-24: 15 mg via ORAL
  Administered 2022-07-25: 10 mg via ORAL
  Filled 2022-07-24 (×3): qty 2
  Filled 2022-07-24: qty 3

## 2022-07-24 MED ORDER — METOCLOPRAMIDE HCL 5 MG/ML IJ SOLN: 5.0000 mg | Freq: Three times a day (TID) | INTRAMUSCULAR | Status: DC | PRN

## 2022-07-24 MED ORDER — SODIUM CHLORIDE 0.9 % IV SOLN: INTRAVENOUS | Status: DC

## 2022-07-24 MED ORDER — CHLORHEXIDINE GLUCONATE CLOTH 2 % EX PADS
6.0000 | MEDICATED_PAD | Freq: Every day | CUTANEOUS | Status: DC
  Administered 2022-07-24 – 2022-07-28 (×5): 6 via TOPICAL

## 2022-07-24 MED ORDER — SENNA 8.6 MG PO TABS
1.0000 | ORAL_TABLET | Freq: Two times a day (BID) | ORAL | Status: DC
  Administered 2022-07-24 – 2022-07-28 (×8): 8.6 mg via ORAL
  Filled 2022-07-24 (×9): qty 1

## 2022-07-24 MED ORDER — METHOCARBAMOL 500 MG PO TABS: 500.0000 mg | ORAL_TABLET | Freq: Four times a day (QID) | ORAL | Status: DC | PRN

## 2022-07-24 MED ORDER — DOCUSATE SODIUM 100 MG PO CAPS
100.0000 mg | ORAL_CAPSULE | Freq: Two times a day (BID) | ORAL | Status: DC
  Administered 2022-07-24 – 2022-07-28 (×8): 100 mg via ORAL
  Filled 2022-07-24 (×9): qty 1

## 2022-07-24 MED ORDER — CEFAZOLIN SODIUM-DEXTROSE 2-4 GM/100ML-% IV SOLN: 2.0000 g | Freq: Four times a day (QID) | INTRAVENOUS | Status: DC

## 2022-07-24 NOTE — Progress Notes (Signed)
Patient transported to hemodialysis via bed by volunteer staff

## 2022-07-24 NOTE — Progress Notes (Addendum)
[facility]  PROGRESS NOTE  Trevor [name] FWY:[id number] DOB: 1940-08-30 DOA: 07/21/2022 PCP: Benita Stabile, MD   Brief HPI:   82 year old male with medical history of ESRD, on HD MWF, diabetes mellitus type 2, hyperlipidemia, hypertension, anemia of chronic disease who presented to ED with left hip fracture that was found at orthopedic office today.  Patient fell at the [facility] on 3/29 in the ED.  At that time no imaging was obtained.  He was advised to follow-up with orthopedic surgeon.  Patient has been wheelchair since fall.  He saw orthopedic for today perform left hip x-ray as he was having continued left hip pain with difficulty walking left hip x-ray showed acute fracture. Orthopedic surgeon was consulted, recommended to get hemodialysis at [facility] and then transferred to [facility] long for surgery.    Subjective   Patient seen during hemodialysis.  Denies any complaints.  Pain well-controlled.   Assessment/Plan:    Left hip fracture, s/p left total hip arthroplasty -X-ray showed displaced acute subcapital fracture of left hip -Initial plan was to get surgery at [facility] long after hemodialysis -hemodialysis got delayed yesterday due to poor access issues -Underwent left total hip arthroplasty at [facility] on 07/23/2022 -Orthopedics following -Awaiting PT recommendations   Hypertension -Continue Coreg 12.5 mg p.o. twice daily -As needed IV hydralazine  Hyperlipidemia -Continue Crestor  Hyperkalemia/ESRD -Nephrology following -Got hemodialysis yesterday  Anemia of chronic disease -Hemoglobin is stable at 10.0  Diet-controlled diabetes mellitus -A1c 5.7      Medications     aspirin  81 mg Oral BID   calcitRIOL  1.25 mcg Oral Q M,W,F-HD   carvedilol  12.5 mg Oral BID WC   Chlorhexidine Gluconate Cloth  6 each Topical Daily   docusate sodium  100 mg Oral BID   rosuvastatin  10 mg Oral QPM   senna  1 tablet Oral BID   sodium zirconium cyclosilicate  10 g  Oral Once     Data Reviewed:   CBG:  Recent Labs  Lab 07/21/22 2131 07/22/22 0719 07/23/22 2011  GLUCAP 115* 151* 126*    SpO2: 98 %    Vitals:   07/24/22 1130 07/24/22 1200 07/24/22 1215 07/24/22 1222  BP: (!) 175/57 (!) 134/57 (!) 131/56 (!) 160/54  Pulse: 78 83 82 84  Resp: 16 15 17 17   Temp:    97.9 F (36.6 C)  TempSrc:      SpO2: 100% 100% 100% 98%  Weight:      Height:          Data Reviewed:  Basic Metabolic Panel: Recent Labs  Lab 07/21/22 1625 07/22/22 0116 07/23/22 0058 07/23/22 1625  NA 135 133* 135 135  K 4.8 5.9* 4.4 4.3  CL 95* 99 94* 98  CO2 26 20* 26  --   GLUCOSE 138* 170* 217* 133*  BUN 63* 66* 35* 40*  CREATININE 9.06* 9.24* 5.89* 7.70*  CALCIUM 9.3 8.8* 9.0  --   PHOS  --  6.6*  --   --     CBC: Recent Labs  Lab 07/21/22 1625 07/22/22 0655 07/22/22 0858 07/23/22 0058 07/23/22 1625  WBC 12.1* 12.7* 11.2* 11.8*  --   NEUTROABS 9.7*  --   --   --   --   HGB 11.3* 10.4* 10.0* 10.9* 12.2*  HCT 33.8* 31.4* 30.4* 34.1* 36.0*  MCV 93.9 92.6 92.7 93.7  --   PLT 392 388 370 380  --  LFT Recent Labs  Lab 07/22/22 0116  ALBUMIN 2.9*     Antibiotics: Anti-infectives (From admission, onward)    Start     Dose/Rate Route Frequency Ordered Stop   07/24/22 0200  ceFAZolin (ANCEF) IVPB 2g/100 mL premix  Status:  Discontinued        2 g 200 mL/hr over 30 Minutes Intravenous Every 6 hours 07/24/22 0149 07/24/22 0202   07/23/22 1500  ceFAZolin (ANCEF) IVPB 2g/100 mL premix        2 g 200 mL/hr over 30 Minutes Intravenous On call to O.R. 07/23/22 0115 07/23/22 1825        DVT prophylaxis: Aspirin 81 mg twice daily started per orthopedics  Code Status: Full code  Family Communication: No family at bedside   CONSULTS    Objective    Physical Examination:  General-appears in no acute distress Heart-S1-S2, regular, no murmur auscultated Lungs-clear to auscultation bilaterally, no wheezing or crackles  auscultated Abdomen-soft, nontender, no organomegaly Extremities-no edema in the lower extremities, dressing in place in left hip Neuro-alert, oriented x3, no focal deficit noted  Status is: Inpatient:           Meredeth Ide   [facility] If 7PM-7AM, please contact night-coverage at [email or web address], Office  [phone]   07/24/2022, 12:39 PM  LOS: 3 days

## 2022-07-24 NOTE — Discharge Instructions (Signed)
? ?Dr. Brian Swinteck ?Joint Replacement Specialist ?[facility] ?[address] ?, [place] [address] ?[phone] ? ? ?TOTAL HIP REPLACEMENT POSTOPERATIVE DIRECTIONS ? ? ? ?Hip Rehabilitation, Guidelines Following Surgery  ? ?WEIGHT BEARING ?Weight bearing as tolerated with assist device (walker, cane, etc) as directed, use it as long as suggested by your surgeon or therapist, typically at least 4-6 weeks. ? ?The results of a hip operation are greatly improved after range of motion and muscle strengthening exercises. Follow all safety measures which are given to protect your hip. If any of these exercises cause increased pain or swelling in your joint, decrease the amount until you are comfortable again. Then slowly increase the exercises. Call your caregiver if you have problems or questions.  ? ?HOME CARE INSTRUCTIONS  ?Most of the following instructions are designed to prevent the dislocation of your new hip.  ?Remove items at home which could result in a fall. This includes throw rugs or furniture in walking pathways.  ?Continue medications as instructed at time of discharge. ?You may have some home medications which will be placed on hold until you complete the course of blood thinner medication. ?You may start showering once you are discharged home. Do not remove your dressing. ?Do not put on socks or shoes without following the instructions of your caregivers.   ?Sit on chairs with arms. Use the chair arms to help push yourself up when arising.  ?Arrange for the use of a toilet seat elevator so you are not sitting low.  ?Walk with walker as instructed.  ?You may resume a sexual relationship in one month or when given the OK by your caregiver.  ?Use walker as long as suggested by your caregivers.  ?You may put full weight on your legs and walk as much as is comfortable. ?Avoid periods of inactivity such as sitting longer than an hour when not asleep. This helps prevent blood  clots.  ?You may return to work once you are cleared by your surgeon.  ?Do not drive a car for 6 weeks or until released by your surgeon.  ?Do not drive while taking narcotics.  ?Wear elastic stockings for two weeks following surgery during the day but you may remove then at night.  ?Make sure you keep all of your appointments after your operation with all of your doctors and caregivers. You should call the office at the above phone number and make an appointment for approximately two weeks after the date of your surgery. ?Please pick up a stool softener and laxative for home use as long as you are requiring pain medications. ?ICE to the affected hip every three hours for 30 minutes at a time and then as needed for pain and swelling. Continue to use ice on the hip for pain and swelling from surgery. You may notice swelling that will progress down to the foot and ankle.  This is normal after surgery.  Elevate the leg when you are not up walking on it.   ?It is important for you to complete the blood thinner medication as prescribed by your doctor. ?Continue to use the breathing machine which will help keep your temperature down.  It is common for your temperature to cycle up and down following surgery, especially at night when you are not up moving around and exerting yourself.  The breathing machine keeps your lungs expanded and your temperature down. ? ?RANGE OF MOTION AND STRENGTHENING EXERCISES  ?These exercises are designed to help you   keep full movement of your hip joint. Follow your caregiver's or physical therapist's instructions. Perform all exercises about fifteen times, three times per day or as directed. Exercise both hips, even if you have had only one joint replacement. These exercises can be done on a training (exercise) mat, on the floor, on a table or on a bed. Use whatever works the best and is most comfortable for you. Use music or television while you are exercising so that the exercises are a  pleasant break in your day. This will make your life better with the exercises acting as a break in routine you can look forward to.  ?Lying on your back, slowly slide your foot toward your buttocks, raising your knee up off the floor. Then slowly slide your foot back down until your leg is straight again.  ?Lying on your back spread your legs as far apart as you can without causing discomfort.  ?Lying on your side, raise your upper leg and foot straight up from the floor as far as is comfortable. Slowly lower the leg and repeat.  ?Lying on your back, tighten up the muscle in the front of your thigh (quadriceps muscles). You can do this by keeping your leg straight and trying to raise your heel off the floor. This helps strengthen the largest muscle supporting your knee.  ?Lying on your back, tighten up the muscles of your buttocks both with the legs straight and with the knee bent at a comfortable angle while keeping your heel on the floor.  ? ?SKILLED REHAB INSTRUCTIONS: ?If the patient is transferred to a skilled rehab facility following release from the hospital, a list of the current medications will be sent to the facility for the patient to continue.  When discharged from the skilled rehab facility, please have the facility set up the patient's Home Health Physical Therapy prior to being released. Also, the skilled facility will be responsible for providing the patient with their medications at time of release from the facility to include their pain medication and their blood thinner medication. If the patient is still at the rehab facility at time of the two week follow up appointment, the skilled rehab facility will also need to assist the patient in arranging follow up appointment in our office and any transportation needs. ? ?POST-OPERATIVE OPIOID TAPER INSTRUCTIONS: ?It is important to wean off of your opioid medication as soon as possible. If you do not need pain medication after your surgery it is ok  to stop day one. ?Opioids include: ?Codeine, Hydrocodone(Norco, Vicodin), Oxycodone(Percocet, oxycontin) and hydromorphone amongst others.  ?Long term and even short term use of opiods can cause: ?Increased pain response ?Dependence ?Constipation ?Depression ?Respiratory depression ?And more.  ?Withdrawal symptoms can include ?Flu like symptoms ?Nausea, vomiting ?And more ?Techniques to manage these symptoms ?Hydrate well ?Eat regular healthy meals ?Stay active ?Use relaxation techniques(deep breathing, meditating, yoga) ?Do Not substitute Alcohol to help with tapering ?If you have been on opioids for less than two weeks and do not have pain than it is ok to stop all together.  ?Plan to wean off of opioids ?This plan should start within one week post op of your joint replacement. ?Maintain the same interval or time between taking each dose and first decrease the dose.  ?Cut the total daily intake of opioids by one tablet each day ?Next start to increase the time between doses. ?The last dose that should be eliminated is the evening dose.  ? ? ?MAKE   SURE YOU:  ?Understand these instructions.  ?Will watch your condition.  ?Will get help right away if you are not doing well or get worse. ? ?Pick up stool softner and laxative for home use following surgery while on pain medications. ?Do not remove your dressing. ?The dressing is waterproof--it is OK to take showers. ?Continue to use ice for pain and swelling after surgery. ?Do not use any lotions or creams on the incision until instructed by your surgeon. ?Total Hip Protocol. ? ?

## 2022-07-24 NOTE — Progress Notes (Signed)
   07/24/22 1222  Vitals  Temp 97.9 F (36.6 C)  Pulse Rate 84  Resp 17  BP (!) 160/54  SpO2 98 %  Post Treatment  Dialyzer Clearance Lightly streaked  Duration of HD Treatment -hour(s) 3.25 hour(s)  Hemodialysis Intake (mL) 0 mL  Liters Processed 77.6  Fluid Removed (mL) 2000 mL  Tolerated HD Treatment Yes  AVG/AVF Arterial Site Held (minutes) 10 minutes  AVG/AVF Venous Site Held (minutes) 10 minutes   Received patient in bed to unit.  Alert and oriented.  Informed consent signed and in chart.   TX duration:3.25HRS  Patient tolerated well.  Transported back to the room  Alert, without acute distress.  Hand-off given to patient's nurse.   Access used: lua avf Access issues: NONE  Total UF removed: 2L Medication(s) given: NONE    Trevor [name] [facility]

## 2022-07-24 NOTE — Progress Notes (Signed)
RN called hemodialysis to give report on Pt on hold for a while. Will pass report to Day RN to give report.

## 2022-07-24 NOTE — Evaluation (Signed)
Physical Therapy Evaluation Patient Details Name: Trevor [name] MRN: [id number] DOB: [birth year] Today's Date: 07/24/2022  History of Present Illness  Pt is an 82 yo male who had a fall 10 days ago and followed up with orthopedic on 4/11 with findings of L hip fracture. Currently s/p L THA on 4/11 anterior approach. PMH: Anemia, CKD, DM, Hepatitis kidney stones, hyperlipidemia, HTN.  Clinical Impression  Pt is presenting below baseline. Currently he is Max A for bed mobility and Mod a for sit to stand from EOB. Pt was unable to shift wgt to take a side step. Pt was very fatigued today after surgery yesterday and HD today. Pt may progress. Currently recommending skilled physical therapy services at a higher level of care and frequency at 5x/week in order to work on functional mobility, strength, balance in order to return to decreased level of assistance to return home. No signs/symptoms of cardiac/respiratory distress during session.        Recommendations for follow up therapy are one component of a multi-disciplinary discharge planning process, led by the attending physician.  Recommendations may be updated based on patient status, additional functional criteria and insurance authorization.  Follow Up Recommendations Can patient physically be transported by private vehicle: No     Assistance Recommended at Discharge Intermittent Supervision/Assistance  Patient can return home with the following  A lot of help with walking and/or transfers;Help with stairs or ramp for entrance;Assist for transportation;Assistance with cooking/housework    Equipment Recommendations None recommended by PT  Recommendations for Other Services       Functional Status Assessment Patient has had a recent decline in their functional status and demonstrates the ability to make significant improvements in function in a reasonable and predictable amount of time.     Precautions / Restrictions  Precautions Precautions: Fall Restrictions Weight Bearing Restrictions: Yes LLE Weight Bearing: Weight bearing as tolerated      Mobility  Bed Mobility Overal bed mobility: Needs Assistance Bed Mobility: Supine to Sit, Sit to Supine     Supine to sit: Max assist Sit to supine: Max assist   General bed mobility comments: Pt was very fatigued after surgery yesterday and HD today. Pt was Max A with LLE to EOB and then Min a with RLE for initiaion. Pt trunk was Mod A to get to sitting mid line. Patient Response: Cooperative, Flat affect  Transfers Overall transfer level: Needs assistance Equipment used: 1 person hand held assist Transfers: Sit to/from Stand Sit to Stand: Mod assist           General transfer comment: Pt was mod A with sit to stand from EOB with bedrail and HHA. Pt states that he does not want to do more than stand EOB today due to significant fatigue and was unable to wgt shift to take a side step at EOB. After getting back to bed pt stated he is anticipating pain and will be able to do more tomorrow.    Ambulation/Gait   General Gait Details: unable to progress today due to fatigue.  Stairs Stairs:  (not applicable pt does not have stairs)               Balance Overall balance assessment: Needs assistance Sitting-balance support: Bilateral upper extremity supported, Feet unsupported, Feet supported Sitting balance-Leahy Scale: Fair Sitting balance - Comments: no overt LOB   Standing balance support: Bilateral upper extremity supported Standing balance-Leahy Scale: Poor Standing balance comment: Mod A to remani standing EOB with  bed rail and HHA       Pertinent Vitals/Pain Pain Assessment Pain Assessment: Faces Faces Pain Scale: Hurts even more Pain Location: L hip Pain Descriptors / Indicators: Aching, Grimacing, Guarding, Operative site guarding Pain Intervention(s): Monitored during session, Limited activity within patient's tolerance     Home Living Family/patient expects to be discharged to:: Private residence Living Arrangements: Spouse/significant other Available Help at Discharge: Family;Available 24 hours/day (wife and son) Type of Home: House Home Access: Ramped entrance       Home Layout: Two level;Able to live on main level with bedroom/bathroom Home Equipment: Gilmer Mor - single point;Wheelchair - Biomedical scientist (2 wheels);Grab bars - toilet;Grab bars - tub/shower;BSC/3in1      Prior Function Prior Level of Function : Independent/Modified Independent;Driving             Mobility Comments: Pt states prior to fall he was Mod I to independent without AD. ADLs Comments: Pt states prior to fall he did not need any help. States that he cares for spouse who has some dementia     Hand Dominance   Dominant Hand: Right    Extremity/Trunk Assessment   Upper Extremity Assessment Upper Extremity Assessment: Defer to OT evaluation    Lower Extremity Assessment Lower Extremity Assessment: Overall WFL for tasks assessed    Cervical / Trunk Assessment Cervical / Trunk Assessment: Normal  Communication   Communication: No difficulties  Cognition Arousal/Alertness: Lethargic Behavior During Therapy: WFL for tasks assessed/performed, Flat affect Overall Cognitive Status: Within Functional Limits for tasks assessed        General Comments General comments (skin integrity, edema, etc.): Pt is partial caregiver 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 He states the was mobile prior to fall.        Assessment/Plan    PT Assessment Patient needs continued PT services  PT Problem List Decreased strength;Decreased mobility;Decreased activity tolerance;Decreased balance       PT Treatment Interventions DME instruction;Therapeutic activities;Gait training;Therapeutic exercise;Patient/family education;Stair training;Balance training;Functional mobility training;Neuromuscular re-education;Manual  techniques    PT Goals (Current goals can be found in the Care Plan section)  Acute Rehab PT Goals Patient Stated Goal: to return home PT Goal Formulation: With patient Time For Goal Achievement: 08/07/22 Potential to Achieve Goals: Fair    Frequency Min 5X/week        AM-PAC PT "6 Clicks" Mobility  Outcome Measure Help needed turning from your back to your side while in a flat bed without using bedrails?: A Lot Help needed moving from lying on your back to sitting on the side of a flat bed without using bedrails?: A Lot Help needed moving to and from a bed to a chair (including a wheelchair)?: A Lot Help needed standing up from a chair using your arms (e.g., wheelchair or bedside chair)?: A Lot Help needed to walk in hospital room?: Total Help needed climbing 3-5 steps with a railing? : Total 6 Click Score: 10    End of Session Equipment Utilized During Treatment: Gait belt Activity Tolerance: Patient limited by lethargy Patient left: in bed;with call bell/phone within reach;with bed alarm set Nurse Communication: Mobility status PT Visit Diagnosis: Other abnormalities of gait and mobility (R26.89);Unsteadiness on feet (R26.81);Muscle weakness (generalized) (M62.81)    Time: 1610-9604 PT Time Calculation (min) (ACUTE ONLY): 24 min   Charges:   PT Evaluation $PT Eval Low Complexity: 1 Low PT Treatments $Therapeutic Activity: 8-22 mins        Harrel Carina, DPT, CLT  Acute Rehabilitation  Services Office: [phone] (Secure chat preferred)   Claudia Desanctis 07/24/2022, 3:06 PM

## 2022-07-24 NOTE — TOC CM/SW Note (Signed)
  Transition of Care [facility]) Screening Note   Patient Details  Name: Trevor [name] Date of Birth: [birth year]   Transition of [facility]) CM/SW Contact:    Kingsley Plan, RN Phone Number: 07/24/2022, 2:07 PM  07/23/22 Left total hip arthroplasty , hemodialysis patient. Await post op PT evaluation for recommendations.   Transition of Care Department [facility]) has reviewed patient ,we will continue to monitor patient advancement through interdisciplinary progression rounds.

## 2022-07-24 NOTE — Progress Notes (Signed)
Report called to hemodialysis. Nurse verbalized understanding of report. Stated to hold coreg this morning because it could decrease BP during treatment.

## 2022-07-24 NOTE — Progress Notes (Signed)
[facility] Progress Note   Subjective:   no new issues.  S/p hip surgery for fracture yesterday.  Seen on HD today, no issues.   Objective Vitals:   07/24/22 1130 07/24/22 1200 07/24/22 1215 07/24/22 1222  BP: (!) 175/57 (!) 134/57 (!) 131/56 (!) 160/54  Pulse: 78 83 82 84  Resp: Temp:    97.9 F (36.6 C)  TempSrc:      SpO2: 100% 100% 100% 98%  Weight:      Height:       Physical Exam Gen: lying flat comfortably  Eyes:  RRR ENT: MMM Neck: supple CV:  RRR Abd:  soft Lungs: clear ant GU: no foley Extr:  no edema, LUE AVF +t/b Neuro: grossly in tact  Additional Objective Labs: Basic Metabolic Panel: Recent Labs  Lab 07/21/22 1625 07/22/22 0116 07/23/22 0058 07/23/22 1625  NA 135 133* 135 135  K 4.8 5.9* 4.4 4.3  CL 95* 99 94* 98  CO2 26 20* 26  --   GLUCOSE 138* 170* 217* 133*  BUN 63* 66* 35* 40*  CREATININE 9.06* 9.24* 5.89* 7.70*  CALCIUM 9.3 8.8* 9.0  --   PHOS  --  6.6*  --   --     Liver Function Tests: Recent Labs  Lab 07/22/22 0116  ALBUMIN 2.9*    No results for input(s): "LIPASE", "AMYLASE" in the last 168 hours. CBC: Recent Labs  Lab 07/21/22 1625 07/22/22 0655 07/22/22 0858 07/23/22 0058 07/23/22 1625  WBC 12.1* 12.7* 11.2* 11.8*  --   NEUTROABS 9.7*  --   --   --   --   HGB 11.3* 10.4* 10.0* 10.9* 12.2*  HCT 33.8* 31.4* 30.4* 34.1* 36.0*  MCV 93.9 92.6 92.7 93.7  --   PLT 392 388 370 380  --     Blood Culture    Component Value Date/Time   SDES BLOOD RIGHT HAND 10/23/2020 1801   SDES SITE NOT SPECIFIED 10/23/2020 1801   SPECREQUEST  10/23/2020 1801    BOTTLES DRAWN AEROBIC AND ANAEROBIC Blood Culture adequate volume   SPECREQUEST  10/23/2020 1801    BOTTLES DRAWN AEROBIC AND ANAEROBIC Blood Culture results may not be optimal due to an inadequate volume of blood received in culture bottles   CULT  10/23/2020 1801    NO GROWTH 5 DAYS Performed at [facility], [address] [place],  [place] [address]    CULT  10/23/2020 1801    NO GROWTH 5 DAYS Performed at [facility], [address] [place], [place] [address]    REPTSTATUS 10/28/2020 FINAL 10/23/2020 1801   REPTSTATUS 10/28/2020 FINAL 10/23/2020 1801    Cardiac Enzymes: No results for input(s): "CKTOTAL", "CKMB", "CKMBINDEX", "TROPONINI" in the last 168 hours. CBG: Recent Labs  Lab 07/21/22 2131 07/22/22 0719 07/23/22 2011  GLUCAP 115* 151* 126*    Iron Studies: No results for input(s): "IRON", "TIBC", "TRANSFERRIN", "FERRITIN" in the last 72 hours. @ Studies/Results: DG Pelvis Portable  Result Date: 07/23/2022 CLINICAL DATA:  Postop EXAM: PORTABLE PELVIS 1-2 VIEWS COMPARISON:  10/09/2020, 07/21/2022 FINDINGS: Previous right hip replacement. Interval left hip replacement with intact hardware and normal alignment. Gas in the soft tissues consistent with recent surgery IMPRESSION: Status post left hip replacement with expected postsurgical change. Electronically Signed   By: Jasmine Pang M.D.   On: 07/23/2022 20:50   DG HIP UNILAT WITH PELVIS 1V LEFT  Result Date: 07/23/2022 CLINICAL DATA:  Left femoral  neck fracture EXAM: DG HIP (WITH OR WITHOUT PELVIS) 1V*L* COMPARISON:  07/21/2022 FLUOROSCOPY TIME:  Radiation Exposure Index (as provided by the fluoroscopic device): 0.9 mGy If the device does not provide the exposure index: Fluoroscopy Time:  13 seconds Number of Acquired Images:  2 FINDINGS: Left hip prosthesis is noted in satisfactory position. Previously placed right hip prosthesis is again noted. IMPRESSION: Status post left hip replacement Electronically Signed   By: Alcide Clever M.D.   On: 07/23/2022 20:20   DG C-Arm 1-60 Min-No Report  Result Date: 07/23/2022 Fluoroscopy was utilized by the requesting physician.  No radiographic interpretation.   DG C-Arm 1-60 Min-No Report  Result Date: 07/23/2022 Fluoroscopy was utilized by the requesting physician.  No radiographic interpretation.    Medications:  sodium chloride 150 mL/hr at 07/24/22 0457   methocarbamol (ROBAXIN) IV      aspirin  81 mg Oral BID   calcitRIOL  1.25 mcg Oral Q M,W,F-HD   carvedilol  12.5 mg Oral BID WC   Chlorhexidine Gluconate Cloth  6 each Topical Daily   docusate sodium  100 mg Oral BID   rosuvastatin  10 mg Oral QPM   senna  1 tablet Oral BID   sodium zirconium cyclosilicate  10 g Oral Once    Dialysis Orders:  4hrs, EDW 96.5kg, BFR 400, DFR 500 Heparin 500/hr Mircera 30 q2wks - last dose given 4/8; venofer 50 weekly given 4/8 Calcitriol 1.96mcg TIW   Assessment/Plan **Hip fracture: s/p operative repair 4/11   **ESRD on HD:  MWF - initial difficulty cannulating but on reattempt was successfully cannulated on 4/10.  HD going fine today   **Anemia: very mild, had ESA earlier this week.    **Secondary hyperPTH: cont calcitriol and binder here   **HTN: cont home meds   Will follow, reach out with concerns.  OK for d/c from nephrology perspective.  Estill Bakes MD 07/24/2022, 12:43 PM  [facility] Pager: [phone]

## 2022-07-25 DIAGNOSIS — N186 End stage renal disease: Secondary | ICD-10-CM | POA: Diagnosis not present

## 2022-07-25 DIAGNOSIS — S72002A Fracture of unspecified part of neck of left femur, initial encounter for closed fracture: Secondary | ICD-10-CM | POA: Diagnosis not present

## 2022-07-25 DIAGNOSIS — I10 Essential (primary) hypertension: Secondary | ICD-10-CM | POA: Diagnosis not present

## 2022-07-25 DIAGNOSIS — D631 Anemia in chronic kidney disease: Secondary | ICD-10-CM | POA: Diagnosis not present

## 2022-07-25 LAB — GLUCOSE, CAPILLARY: Glucose-Capillary: 186 mg/dL — ABNORMAL HIGH (ref 70–99)

## 2022-07-25 MED ORDER — HYDRALAZINE HCL 50 MG PO TABS
50.0000 mg | ORAL_TABLET | Freq: Two times a day (BID) | ORAL | Status: DC
  Administered 2022-07-25 – 2022-07-28 (×5): 50 mg via ORAL
  Filled 2022-07-25 (×6): qty 1

## 2022-07-25 NOTE — Progress Notes (Signed)
[facility]  PROGRESS NOTE  Trevor [name] TDD:[id number] DOB: 15-Jan-1941 DOA: 07/21/2022 PCP: Benita Stabile, MD   Brief HPI:   82 year old male with medical history of ESRD, on HD MWF, diabetes mellitus type 2, hyperlipidemia, hypertension, anemia of chronic disease who presented to ED with left hip fracture that was found at orthopedic office today.  Patient fell at the [facility] on 3/29 in the ED.  At that time no imaging was obtained.  He was advised to follow-up with orthopedic surgeon.  Patient has been wheelchair since fall.  He saw orthopedic for today perform left hip x-ray as he was having continued left hip pain with difficulty walking left hip x-ray showed acute fracture. Orthopedic surgeon was consulted, recommended to get hemodialysis at [facility] and then transferred to [facility] long for surgery.    Subjective   Patient seen, pain well-controlled.   Assessment/Plan:    Left hip fracture, s/p left total hip arthroplasty -X-ray showed displaced acute subcapital fracture of left hip -Initial plan was to get surgery at [facility] long after hemodialysis -hemodialysis got delayed yesterday due to poor access issues -Underwent left total hip arthroplasty at [facility] on 07/23/2022 -Orthopedics following -PT consulted, plan to go to skilled nursing facility for rehab.   Hypertension -Continue Coreg 12.5 mg p.o. twice daily -Will restart hydralazine 50 mg p.o. twice daily, patient's home dose  Hyperlipidemia -Continue Crestor  Hyperkalemia/ESRD -Nephrology following -Got hemodialysis yesterday  Anemia of chronic disease -Hemoglobin is stable at 10.0  Diet-controlled diabetes mellitus -A1c 5.7     Medications     aspirin  81 mg Oral BID   calcitRIOL  1.25 mcg Oral Q M,W,F-HD   carvedilol  12.5 mg Oral BID WC   Chlorhexidine Gluconate Cloth  6 each Topical Daily   docusate sodium  100 mg Oral BID   rosuvastatin  10 mg Oral QPM   senna  1 tablet Oral BID    sodium zirconium cyclosilicate  10 g Oral Once     Data Reviewed:   CBG:  Recent Labs  Lab 07/21/22 2131 07/22/22 0719 07/23/22 2011 07/25/22 0758  GLUCAP 115* 151* 126* 186*    SpO2: 99 %    Vitals:   07/24/22 1646 07/24/22 1951 07/25/22 0413 07/25/22 0750  BP: (!) 131/55 (!) 132/50 (!) 149/49 (!) 152/61  Pulse: 92 80 73 79  Resp: 17 16 18 17   Temp: 99.4 F (37.4 C) 98.4 F (36.9 C) 98.8 F (37.1 C) 98.7 F (37.1 C)  TempSrc: Oral Oral Oral Oral  SpO2: 95% 97% 98% 99%  Weight:      Height:          Data Reviewed:  Basic Metabolic Panel: Recent Labs  Lab 07/21/22 1625 07/22/22 0116 07/23/22 0058 07/23/22 1625  NA 135 133* 135 135  K 4.8 5.9* 4.4 4.3  CL 95* 99 94* 98  CO2 26 20* 26  --   GLUCOSE 138* 170* 217* 133*  BUN 63* 66* 35* 40*  CREATININE 9.06* 9.24* 5.89* 7.70*  CALCIUM 9.3 8.8* 9.0  --   PHOS  --  6.6*  --   --     CBC: Recent Labs  Lab 07/21/22 1625 07/22/22 0655 07/22/22 0858 07/23/22 0058 07/23/22 1625  WBC 12.1* 12.7* 11.2* 11.8*  --   NEUTROABS 9.7*  --   --   --   --   HGB 11.3* 10.4* 10.0* 10.9* 12.2*  HCT 33.8* 31.4* 30.4* 34.1* 36.0*  MCV  93.9 92.6 92.7 93.7  --   PLT 392 388 370 380  --     LFT Recent Labs  Lab 07/22/22 0116  ALBUMIN 2.9*     Antibiotics: Anti-infectives (From admission, onward)    Start     Dose/Rate Route Frequency Ordered Stop   07/24/22 0200  ceFAZolin (ANCEF) IVPB 2g/100 mL premix  Status:  Discontinued        2 g 200 mL/hr over 30 Minutes Intravenous Every 6 hours 07/24/22 0149 07/24/22 0202   07/23/22 1500  ceFAZolin (ANCEF) IVPB 2g/100 mL premix        2 g 200 mL/hr over 30 Minutes Intravenous On call to O.R. 07/23/22 0115 07/23/22 1825        DVT prophylaxis: Aspirin 81 mg twice daily started per orthopedics  Code Status: Full code  Family Communication: No family at bedside   CONSULTS    Objective    Physical Examination:  General-appears in no acute  distress Heart-S1-S2, regular, no murmur auscultated Lungs-clear to auscultation bilaterally, no wheezing or crackles auscultated Abdomen-soft, nontender, no organomegaly Extremities-no edema in the lower extremities Neuro-alert, oriented x3, no focal deficit noted  Status is: Inpatient:           Meredeth Ide   [facility] If 7PM-7AM, please contact night-coverage at [email or web address], Office  [phone]   07/25/2022, 9:47 AM  LOS: 4 days

## 2022-07-25 NOTE — Progress Notes (Signed)
    Subjective:  Patient reports pain as mild to moderate at rest - severe with PT session today.  Denies N/V/CP/SOB. Went to HD yesterday.  Objective:   VITALS:   Vitals:   07/24/22 1951 07/25/22 0413 07/25/22 0750 07/25/22 1414  BP: (!) 132/50 (!) 149/49 (!) 152/61 129/64  Pulse: 80 73 79 73  Resp: 16 18 17 19   Temp: 98.4 F (36.9 C) 98.8 F (37.1 C) 98.7 F (37.1 C) 98.9 F (37.2 C)  TempSrc: Oral Oral Oral Oral  SpO2: 97% 98% 99% 100%  Weight:      Height:        NAD ABD soft Sensation intact distally Intact pulses distally Dorsiflexion/Plantar flexion intact Incision: dressing C/D/I Compartment soft   Lab Results  Component Value Date   WBC 11.8 (H) 07/23/2022   HGB 12.2 (L) 07/23/2022   HCT 36.0 (L) 07/23/2022   MCV 93.7 07/23/2022   PLT 380 07/23/2022   BMET    Component Value Date/Time   NA 135 07/23/2022 1625   K 4.3 07/23/2022 1625   CL 98 07/23/2022 1625   CO2 26 07/23/2022 0058   GLUCOSE 133 (H) 07/23/2022 1625   BUN 40 (H) 07/23/2022 1625   BUN 60 (A) 04/30/2020 0000   CREATININE 7.70 (H) 07/23/2022 1625   CREATININE 7.30 (H) 08/27/2020 0000   CALCIUM 9.0 07/23/2022 0058   GFRNONAA 9 (L) 07/23/2022 0058     Assessment/Plan: 2 Days Post-Op   Principal Problem:   Closed left hip fracture Active Problems:   Anemia   Essential hypertension   Hyperlipidemia   End stage renal disease   WBAT with walker DVT ppx: Aspirin, SCDs, TEDS PO pain control PT/OT Dispo: Currently min assist with PT- D/C planning  Iline Oven Ceria Suminski 07/25/2022, 3:03 PM   [name], MD [phone] [facility] is now [facility]  [facility] [address], [place], [place] [address] Phone: [phone] [email or web address] Facebook  [employer]

## 2022-07-25 NOTE — Progress Notes (Signed)
Physical Therapy Treatment Patient Details Name: Trevor [name] MRN: [id number] DOB: June 27, 1940 Today's Date: 07/25/2022   History of Present Illness Pt is an 82 yo male who had a fall 10 days ago and followed up with orthopedic on 4/11 with findings of L hip fracture. Currently s/p L THA on 4/11 anterior approach. PMH: Anemia, CKD, DM, Hepatitis kidney stones, hyperlipidemia, HTN.    PT Comments    Pt was received in supine and agreeable to session with encouragement. Pt was able to improve bed mobility with increased time and min A for BLE management. Pt able to stand from EOB x3 and perform weight shifting and marching with heavy reliance on RW support. Pt demonstrating difficulty clearing R foot due to limited weight shift to L, however this improved with increased repetitions. Pt continues to be limited by L hip pain and decreased activity tolerance. Pt continues to benefit from PT services to progress toward functional mobility goals.     Recommendations for follow up therapy are one component of a multi-disciplinary discharge planning process, led by the attending physician.  Recommendations may be updated based on patient status, additional functional criteria and insurance authorization.  Follow Up Recommendations  Can patient physically be transported by private vehicle: No    Assistance Recommended at Discharge Intermittent Supervision/Assistance  Patient can return home with the following A lot of help with walking and/or transfers;Help with stairs or ramp for entrance;Assist for transportation;Assistance with cooking/housework   Equipment Recommendations  None recommended by PT    Recommendations for Other Services       Precautions / Restrictions Precautions Precautions: Fall Restrictions Weight Bearing Restrictions: Yes LLE Weight Bearing: Weight bearing as tolerated     Mobility  Bed Mobility Overal bed mobility: Needs Assistance Bed Mobility: Supine to Sit,  Sit to Supine     Supine to sit: Min assist Sit to supine: Min assist   General bed mobility comments: Min A for LLE elevation for pt to advance to EOB and to elevate BLE to EOB to return to supine. Instructed pt on use of gait belt to elevate BLE, but pt had difficulty with upper body postioning despite cues.    Transfers Overall transfer level: Needs assistance Equipment used: Rolling walker (2 wheels) Transfers: Sit to/from Stand Sit to Stand: Min assist, Min guard, From elevated surface           General transfer comment: from slightly elevated EOB x3 with min A progressing to min guard. Pt demonstrating faster and smoother power up with each stand.    Ambulation/Gait             Pre-gait activities: weight shifting and marching at EOB General Gait Details: unable due to pain       Balance Overall balance assessment: Needs assistance Sitting-balance support: Bilateral upper extremity supported, Feet supported Sitting balance-Leahy Scale: Fair Sitting balance - Comments: sitting EOB   Standing balance support: Bilateral upper extremity supported, Reliant on assistive device for balance Standing balance-Leahy Scale: Poor Standing balance comment: with RW support                            Cognition Arousal/Alertness: Awake/alert Behavior During Therapy: WFL for tasks assessed/performed, Flat affect Overall Cognitive Status: Within Functional Limits for tasks assessed  Exercises Total Joint Exercises Heel Slides: AAROM, Supine, AROM, Both, 5 reps    General Comments        Pertinent Vitals/Pain Pain Assessment Pain Assessment: Faces Faces Pain Scale: Hurts even more Pain Location: L hip Pain Descriptors / Indicators: Aching, Grimacing, Guarding, Operative site guarding Pain Intervention(s): Limited activity within patient's tolerance, Monitored during session, Repositioned      PT Goals (current goals can now be found in the care plan section) Acute Rehab PT Goals Patient Stated Goal: to return home PT Goal Formulation: With patient Time For Goal Achievement: 08/07/22 Potential to Achieve Goals: Fair Progress towards PT goals: Progressing toward goals    Frequency    Min 5X/week      PT Plan Current plan remains appropriate       AM-PAC PT "6 Clicks" Mobility   Outcome Measure  Help needed turning from your back to your side while in a flat bed without using bedrails?: A Little Help needed moving from lying on your back to sitting on the side of a flat bed without using bedrails?: A Little Help needed moving to and from a bed to a chair (including a wheelchair)?: A Lot Help needed standing up from a chair using your arms (e.g., wheelchair or bedside chair)?: A Little Help needed to walk in hospital room?: A Lot Help needed climbing 3-5 steps with a railing? : Total 6 Click Score: 14    End of Session Equipment Utilized During Treatment: Gait belt Activity Tolerance: Patient limited by pain Patient left: in bed;with call bell/phone within reach;with bed alarm set Nurse Communication: Mobility status PT Visit Diagnosis: Other abnormalities of gait and mobility (R26.89);Unsteadiness on feet (R26.81);Muscle weakness (generalized) (M62.81)     Time: 2111-7356 PT Time Calculation (min) (ACUTE ONLY): 30 min  Charges:  $Therapeutic Activity: 23-37 mins                     Johny Shock, PTA Acute Rehabilitation Services Secure Chat Preferred  [phone]    Johny Shock 07/25/2022, 2:17 PM

## 2022-07-25 NOTE — NC FL2 (Signed)
[place] MEDICAID FL2 LEVEL OF CARE FORM     IDENTIFICATION  Patient Name: Trevor [name] Birthdate: April 23, 1940 Sex: male Admission Date (Current Location): 07/21/2022  [facility] [place] Number:  [employer] and Address:  [facility], [address] [place], [place] [address]      Provider Number: [id number]  Attending Physician Name and Address:  Meredeth Ide, MD  Relative Name and Phone Number:       Current Level of Care: Hospital Recommended Level of Care: Skilled Nursing Facility Prior Approval Number:    Date Approved/Denied:   PASRR Number: [id number]  Discharge Plan: SNF    Current Diagnoses: Patient Active Problem List   Diagnosis Date Noted   Closed left hip fracture 07/21/2022   Peripherally inserted central venous catheter in situ 01/07/2021   Secondary hyperparathyroidism of renal origin 01/07/2021   Hepatitis 01/07/2021   Hypertensive heart disease with end stage renal disease 01/07/2021   Fracture of neck of femur 11/08/2020   Hip fracture 10/08/2020   End stage renal disease 10/08/2020   Hyponatremia 10/08/2020   Hyperglycemia 10/08/2020   Hip pain 10/08/2020   History of renal dialysis 09/13/2020   Anemia 09/13/2020   Edema 09/13/2020   Essential hypertension 09/13/2020   Gastroesophageal reflux disease 09/13/2020   Hyperlipidemia 09/13/2020   Neuropathy 09/13/2020   Polyp of colon 09/13/2020   Proteinuria 09/13/2020   Latent tuberculosis 08/27/2020   Medication monitoring encounter 08/27/2020   Immunization counseling 08/27/2020   TB lung, latent 07/25/2020   Diabetes mellitus with stage 5 chronic kidney disease 05/02/2020   Thyroid nodule 05/02/2020   Chronic kidney disease, stage 4 (severe) 05/16/2019   Special screening for malignant neoplasms, colon     Orientation RESPIRATION BLADDER Height & Weight     Self, Time, Situation, Place  Normal Continent Weight: 215 lb 9.8 oz (97.8  kg) Height:   (177.8 cm)  BEHAVIORAL SYMPTOMS/MOOD NEUROLOGICAL BOWEL NUTRITION STATUS      Continent Diet (See dc summary)  AMBULATORY STATUS COMMUNICATION OF NEEDS Skin   Extensive Assist Verbally Surgical wounds (Closed incision on hip)                       Personal Care Assistance Level of Assistance  Bathing, Feeding, Dressing Bathing Assistance: Maximum assistance Feeding assistance: Limited assistance Dressing Assistance: Limited assistance     Functional Limitations Info             SPECIAL CARE FACTORS FREQUENCY  PT (By licensed PT), OT (By licensed OT)     PT Frequency: 5x/week OT Frequency: 5x/week            Contractures Contractures Info: Not present    Additional Factors Info  Code Status, Allergies Code Status Info: Full Allergies Info: NKA           Current Medications (07/25/2022):  This is the current hospital active medication list Current Facility-Administered Medications  Medication Dose Route Frequency Provider Last Rate Last Admin   0.9 %  sodium chloride infusion   Intravenous Continuous [name], MD 150 mL/hr at 07/24/22 0457 Infusion Verify at 07/24/22 0457   acetaminophen (TYLENOL) tablet 325-650 mg  325-650 mg Oral Q6H PRN Swinteck, Arlys John, MD       alum & mag hydroxide-simeth (MAALOX/MYLANTA) 200-200-20 MG/5ML suspension 30 mL  30 mL Oral Q4H PRN [name], MD       aspirin chewable tablet 81  mg  81 mg Oral BID [name], MD   81 mg at 07/25/22 3833   calcitRIOL (ROCALTROL) capsule 1.25 mcg  1.25 mcg Oral Q M,W,F-HD Swinteck, Arlys John, MD   1.25 mcg at 07/24/22 1358   carvedilol (COREG) tablet 12.5 mg  12.5 mg Oral BID WC [name], MD   12.5 mg at 07/25/22 3832   Chlorhexidine Gluconate Cloth 2 % PADS 6 each  6 each Topical Daily Meredeth Ide, MD   6 each at 07/25/22 0854   diphenhydrAMINE (BENADRYL) 12.5 MG/5ML elixir 12.5-25 mg  12.5-25 mg Oral Q4H PRN Swinteck, Arlys John, MD       docusate sodium  (COLACE) capsule 100 mg  100 mg Oral BID [name], MD   100 mg at 07/25/22 9191   hydrALAZINE (APRESOLINE) injection 10 mg  10 mg Intravenous Q6H PRN [name], MD   10 mg at 07/21/22 2338   HYDROmorphone (DILAUDID) injection 1 mg  1 mg Intravenous Q4H PRN [name], MD   1 mg at 07/24/22 0051   menthol-cetylpyridinium (CEPACOL) lozenge 3 mg  1 lozenge Oral PRN Swinteck, Arlys John, MD       Or   phenol (CHLORASEPTIC) mouth spray 1 spray  1 spray Mouth/Throat PRN Swinteck, Arlys John, MD       methocarbamol (ROBAXIN) tablet 500 mg  500 mg Oral Q6H PRN Swinteck, Arlys John, MD       Or   methocarbamol (ROBAXIN) 500 mg in dextrose 5 % 50 mL IVPB  500 mg Intravenous Q6H PRN Swinteck, Arlys John, MD       metoCLOPramide (REGLAN) tablet 5-10 mg  5-10 mg Oral Q8H PRN Swinteck, Arlys John, MD       Or   metoCLOPramide (REGLAN) injection 5-10 mg  5-10 mg Intravenous Q8H PRN Swinteck, Arlys John, MD       ondansetron [facility]) tablet 4 mg  4 mg Oral Q6H PRN Swinteck, Arlys John, MD       Or   ondansetron (ZOFRAN) injection 4 mg  4 mg Intravenous Q6H PRN Swinteck, Arlys John, MD       oxyCODONE (Oxy IR/ROXICODONE) immediate release tablet 10-15 mg  10-15 mg Oral Q4H PRN [name], MD   15 mg at 07/24/22 0359   oxyCODONE (Oxy IR/ROXICODONE) immediate release tablet 5-10 mg  5-10 mg Oral Q4H PRN Swinteck, Arlys John, MD       polyethylene glycol (MIRALAX / GLYCOLAX) packet 17 g  17 g Oral Daily PRN Swinteck, Arlys John, MD       rosuvastatin (CRESTOR) tablet 10 mg  10 mg Oral QPM Swinteck, Arlys John, MD   10 mg at 07/24/22 1722   senna (SENOKOT) tablet 8.6 mg  1 tablet Oral BID [name], MD   8.6 mg at 07/25/22 6606   sodium zirconium cyclosilicate (LOKELMA) packet 10 g  10 g Oral Once [name], MD         Discharge Medications: Please see discharge summary for a list of discharge medications.  Relevant Imaging Results:  Relevant Lab Results:   Additional Information SSN: [id number]. Requires transportation to  outpatient dialysis MWF at [facility], LCSW

## 2022-07-25 NOTE — TOC Initial Note (Addendum)
Transition of Care [facility]) - Initial/Assessment Note    Patient Details  Name: Trevor [name] MRN: [id number] Date of Birth: [birth year]  [facility] CM/SW Contact:    Mearl Latin, LCSW Phone Number: 07/25/2022, 12:36 PM  Clinical Narrative:                 CSW received consult for possible SNF placement at time of discharge. CSW spoke with patient. Patient reported that patient's spouse is currently unable to care for patient at their home given patient's current physical needs and fall risk. Patient expressed understanding of PT recommendation and is agreeable to SNF placement at time of discharge. Patient reports preference for [facility]. He stated he does not want to return to [facility] under any circumstances. CSW discussed that [facility] may not accept dialysis patients so he reported being agreeable to either of the [facility] SNFs. He stated he would like to do 2 weeks at rehab and then return home to continue doing outpatient PT at [facility]. He drove himself to dialysis prior to admission. He reported missing his dog at home with his wife. CSW discussed insurance authorization process and will provide Medicare SNF ratings list. CSW will send out referrals for review and provide bed offers as available.   CSW provided supportive listening as patient shared how he fell and broke one hip a year and a half ago. He reports dissatisfaction for the care he has received this admission, though does state that the daytime staff on [facility] are helpful. 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 He shared his pride for his children as well.   Skilled Nursing Rehab Facilities-   [email or web address]   Ratings out of 5 stars (5 the highest)   Name Address  Phone # Quality Care Staffing Health Inspection Overall   [facility] [address] [place] [phone] 4 5 2 3   [facility]  [address], [place] [phone] 4 2 5 5   [facility] [address] [place], [place] [phone] 1 3 1 1   [facility] [address] [phone] 2 2 4 4   [facility] [address] [place] [phone] 2 1 2 1   [facility] [phone] N[address] [place] [phone] 3 3 4 4   [facility] [address], [place] [phone] 4 1 3 2   [facility] [address], [place] [phone] 4 1 3 2   [address] ([facility]) [address] [place] [phone] 3 1 2 1   [facility] [address], [place] [phone] 3 1 1 1   [facility] [address] [facility] [phone] 3 2 2 2   [facility] [address], [place] [phone] 3 1 1 1   [facility]igh[facility] [address] [place] [phone] 4 2 4 4           [facility] [address], [place] [phone]      [facility] [address], [place] [phone] 4 1 3 2   [facility] [address], [place] [phone] 3 1 5 4   [facility] [place] [address] [place], [address] [place] 1 1 2 1   [facility] [address] [phone] 2 2 4  4  [address] [place] (no [facility]) [address], [place] [phone] [facility] (No [employer]) [address] [place], [place] [phone] [facility] (No [facility]) [address], [place] [phone] [facility] [address] [place] [phone] [facility] [phone] [address] [place] [phone] [facility] [address] [place] [phone] [facility] [address] Liliane Shi [phone] [facility]  [address] [place] [phone] [facility] [address]  [place] [phone] [facility] [address] [place], [place] [phone] [facility] [address], [place] [phone] [facility] [address], [place]lyn Clines  [phone] [facility] [address], [place] [phone] [facility] [place] [address], [place] [phone] [facility] (No Hum[employer]) [address] [place] [phone] [facility]) [address], [place]an [phone] [facility] [address] [place], [place] [phone] [facility])  [address], [place] [phone] [facility]) [address], [place] [phone] [facility] [place] [address] [place] [phone] [address] [place], [place] [phone] [facility]) [address] [place] [phone] Expected Discharge Plan: Skilled Nursing Facility Barriers to Discharge: Continued Medical Work up, English as a second language teacher, SNF Pending bed offer (SNF with dialysis transport)   Patient Goals and CMS Choice Patient states their goals for this hospitalization and ongoing recovery are:: Rehab for two weeks then return home CMS Medicare.gov Compare Post Acute Care list provided to:: Patient Choice offered to / list presented to : Patient [facility] ownership interest in [facility].provided to:: Patient    Expected Discharge Plan and Services In-house Referral: Clinical Social Work   Post Acute Care Choice: Skilled Nursing Facility Living arrangements for the past 2 months: Single Family Home                                      Prior Living Arrangements/Services Living arrangements for the past 2 months: Single Family Home Lives with:: Spouse, Pets Patient language and need for  interpreter reviewed:: Yes Do you feel safe going back to the place where you live?: Yes      Need for Family Participation in Patient Care: Yes (Comment) Care giver support system in place?: Yes (comment) Current home services: DME (Cane, walker) Criminal Activity/Legal Involvement Pertinent to Current Situation/Hospitalization: No - Comment as needed  Activities of Daily Living Home Assistive Devices/Equipment: None ADL Screening (condition at time of admission) Patient's cognitive ability adequate to safely complete daily activities?: Yes Is the patient deaf or have difficulty hearing?: No Does the patient have difficulty seeing, even when wearing glasses/contacts?: No Does the patient have difficulty concentrating, remembering, or making decisions?:  No Patient able to express need for assistance with ADLs?: Yes Does the patient have difficulty dressing or bathing?: No Independently performs ADLs?: Yes (appropriate for developmental age) Does the patient have difficulty walking or climbing stairs?: Yes Weakness of Legs: Left Weakness of Arms/Hands: None  Permission Sought/Granted Permission sought to share information with : Facility Industrial/product designer granted to share information with : Yes, Verbal Permission Granted     Permission granted to share info w AGENCY: SNFs        Emotional Assessment Appearance:: Appears stated age Attitude/Demeanor/Rapport: Engaged Affect (typically observed): Accepting, Appropriate Orientation: : Oriented to Self, Oriented to Place, Oriented to  Time, Oriented to Situation Alcohol / Substance Use: Not Applicable Psych Involvement: No (comment)  Admission diagnosis:  Closed left hip fracture [S72.002A] Closed fracture of left hip, initial encounter [S72.002A] Patient Active Problem List   Diagnosis Date Noted   Closed left hip fracture 07/21/2022   Peripherally inserted central venous catheter in situ 01/07/2021   Secondary  hyperparathyroidism of renal origin 01/07/2021   Hepatitis 01/07/2021   Hypertensive heart disease with end stage renal disease 01/07/2021   Fracture of neck of femur 11/08/2020   Hip fracture 10/08/2020   End stage renal disease 10/08/2020   Hyponatremia 10/08/2020   Hyperglycemia 10/08/2020   Hip pain 10/08/2020   History of renal dialysis 09/13/2020   Anemia 09/13/2020   Edema 09/13/2020   Essential hypertension 09/13/2020   Gastroesophageal reflux disease 09/13/2020   Hyperlipidemia 09/13/2020   Neuropathy 09/13/2020   Polyp of colon 09/13/2020   Proteinuria 09/13/2020   Latent tuberculosis 08/27/2020   Medication monitoring encounter 08/27/2020   Immunization counseling 08/27/2020   TB lung, latent 07/25/2020   Diabetes mellitus with stage 5 chronic kidney disease 05/02/2020   Thyroid nodule 05/02/2020   Chronic kidney disease, stage 4 (severe) 05/16/2019   Special screening for malignant neoplasms, colon    PCP:  Benita Stabile, MD Pharmacy:   [facility] [address], [place] - [address] [place] [address] [place] [address]  [place] [address] Phone: [phone] Fax: [phone]  [facility] - [place], [place] - [address] [place] [address] Phone: [phone] Fax: [phone]     Social Determinants of Health (SDOH) Social History: SDOH Screenings   Tobacco Use: Low Risk  (07/24/2022)   SDOH Interventions:     Readmission Risk Interventions     No data to display

## 2022-07-25 NOTE — Progress Notes (Signed)
[facility] Progress Note   Subjective:   no new issues.  Did fine with HD yesterday.  Post op hip fracture repair on Thurs - seems like SNF bound, thinks tues.    Objective Vitals:   07/24/22 1951 07/25/22 0413 07/25/22 0750 07/25/22 1414  BP: (!) 132/50 (!) 149/49 (!) 152/61 129/64  Pulse: 80 73 79 73  Resp: Temp: 98.4 F (36.9 C) 98.8 F (37.1 C) 98.7 F (37.1 C) 98.9 F (37.2 C)  TempSrc: Oral Oral Oral Oral  SpO2: 97% 98% 99% 100%  Weight:      Height:       Physical Exam Gen: lying flat comfortably  Eyes:  RRR ENT: MMM Neck: supple CV:  RRR Abd:  soft Lungs: clear ant GU: no foley Extr:  no edema, LUE AVF +t/b Neuro: grossly in tact  Additional Objective Labs: Basic Metabolic Panel: Recent Labs  Lab 07/21/22 1625 07/22/22 0116 07/23/22 0058 07/23/22 1625  NA 135 133* 135 135  K 4.8 5.9* 4.4 4.3  CL 95* 99 94* 98  CO2 26 20* 26  --   GLUCOSE 138* 170* 217* 133*  BUN 63* 66* 35* 40*  CREATININE 9.06* 9.24* 5.89* 7.70*  CALCIUM 9.3 8.8* 9.0  --   PHOS  --  6.6*  --   --     Liver Function Tests: Recent Labs  Lab 07/22/22 0116  ALBUMIN 2.9*    No results for input(s): "LIPASE", "AMYLASE" in the last 168 hours. CBC: Recent Labs  Lab 07/21/22 1625 07/22/22 0655 07/22/22 0858 07/23/22 0058 07/23/22 1625  WBC 12.1* 12.7* 11.2* 11.8*  --   NEUTROABS 9.7*  --   --   --   --   HGB 11.3* 10.4* 10.0* 10.9* 12.2*  HCT 33.8* 31.4* 30.4* 34.1* 36.0*  MCV 93.9 92.6 92.7 93.7  --   PLT 392 388 370 380  --     Blood Culture    Component Value Date/Time   SDES BLOOD RIGHT HAND 10/23/2020 1801   SDES SITE NOT SPECIFIED 10/23/2020 1801   SPECREQUEST  10/23/2020 1801    BOTTLES DRAWN AEROBIC AND ANAEROBIC Blood Culture adequate volume   SPECREQUEST  10/23/2020 1801    BOTTLES DRAWN AEROBIC AND ANAEROBIC Blood Culture results may not be optimal due to an inadequate volume of blood received in culture bottles   CULT   10/23/2020 1801    NO GROWTH 5 DAYS Performed at [facility], [address], [place], [place] [address]    CULT  10/23/2020 1801    NO GROWTH 5 DAYS Performed at [facility], [address], [place], [place] [address]    REPTSTATUS 10/28/2020 FINAL 10/23/2020 1801   REPTSTATUS 10/28/2020 FINAL 10/23/2020 1801    Cardiac Enzymes: No results for input(s): "CKTOTAL", "CKMB", "CKMBINDEX", "TROPONINI" in the last 168 hours. CBG: Recent Labs  Lab 07/21/22 2131 07/22/22 0719 07/23/22 2011 07/25/22 0758  GLUCAP 115* 151* 126* 186*    Iron Studies: No results for input(s): "IRON", "TIBC", "TRANSFERRIN", "FERRITIN" in the last 72 hours. @ Studies/Results: DG Pelvis Portable  Result Date: 07/23/2022 CLINICAL DATA:  Postop EXAM: PORTABLE PELVIS 1-2 VIEWS COMPARISON:  10/09/2020, 07/21/2022 FINDINGS: Previous right hip replacement. Interval left hip replacement with intact hardware and normal alignment. Gas in the soft tissues consistent with recent surgery IMPRESSION: Status post left hip replacement with expected postsurgical change. Electronically Signed   By: Jasmine Pang M.D.   On: 07/23/2022  20:50   DG HIP UNILAT WITH PELVIS 1V LEFT  Result Date: 07/23/2022 CLINICAL DATA:  Left femoral neck fracture EXAM: DG HIP (WITH OR WITHOUT PELVIS) 1V*L* COMPARISON:  07/21/2022 FLUOROSCOPY TIME:  Radiation Exposure Index (as provided by the fluoroscopic device): 0.9 mGy If the device does not provide the exposure index: Fluoroscopy Time:  13 seconds Number of Acquired Images:  2 FINDINGS: Left hip prosthesis is noted in satisfactory position. Previously placed right hip prosthesis is again noted. IMPRESSION: Status post left hip replacement Electronically Signed   By: Alcide Clever M.D.   On: 07/23/2022 20:20   DG C-Arm 1-60 Min-No Report  Result Date: 07/23/2022 Fluoroscopy was utilized by the requesting physician.  No radiographic interpretation.   DG C-Arm 1-60 Min-No  Report  Result Date: 07/23/2022 Fluoroscopy was utilized by the requesting physician.  No radiographic interpretation.   Medications:  sodium chloride 150 mL/hr at 07/24/22 0457   methocarbamol (ROBAXIN) IV      aspirin  81 mg Oral BID   calcitRIOL  1.25 mcg Oral Q M,W,F-HD   carvedilol  12.5 mg Oral BID WC   Chlorhexidine Gluconate Cloth  6 each Topical Daily   docusate sodium  100 mg Oral BID   hydrALAZINE  50 mg Oral BID   rosuvastatin  10 mg Oral QPM   senna  1 tablet Oral BID   sodium zirconium cyclosilicate  10 g Oral Once    Dialysis Orders:  4hrs, EDW 96.5kg, BFR 400, DFR 500 Heparin 500/hr Mircera 30 q2wks - last dose given 4/8; venofer 50 weekly given 4/8 Calcitriol 1.36mcg TIW   Assessment/Plan **Hip fracture: s/p operative repair 4/11, dispo being arranged   **ESRD on HD:  MWF - initial difficulty cannulating but on reattempt was successfully cannulated on 4/10.  HD went fine 4/12 - next planned 4/15   **Anemia: very mild, had ESA earlier this week.    **Secondary hyperPTH: cont calcitriol and binder here   **HTN: cont home meds   Will follow, reach out with concerns.  OK for d/c from nephrology perspective.  Estill Bakes MD 07/25/2022, 2:33 PM  [facility] Pager: [phone]

## 2022-07-26 DIAGNOSIS — I10 Essential (primary) hypertension: Secondary | ICD-10-CM | POA: Diagnosis not present

## 2022-07-26 DIAGNOSIS — N186 End stage renal disease: Secondary | ICD-10-CM | POA: Diagnosis not present

## 2022-07-26 DIAGNOSIS — D631 Anemia in chronic kidney disease: Secondary | ICD-10-CM | POA: Diagnosis not present

## 2022-07-26 DIAGNOSIS — S72002A Fracture of unspecified part of neck of left femur, initial encounter for closed fracture: Secondary | ICD-10-CM | POA: Diagnosis not present

## 2022-07-26 LAB — GLUCOSE, CAPILLARY: Glucose-Capillary: 141 mg/dL — ABNORMAL HIGH (ref 70–99)

## 2022-07-26 NOTE — Progress Notes (Signed)
[facility] Progress Note   Subjective:   no new issues.  SNF at some point.   Objective Vitals:   07/25/22 1737 07/25/22 2051 07/26/22 0444 07/26/22 0816  BP: 135/61 (!) 122/55 (!) 131/53 (!) 141/52  Pulse: 70 65 64 70  Resp: Temp: 98.6 F (37 C) 98.1 F (36.7 C) 98 F (36.7 C) 98.3 F (36.8 C)  TempSrc: Oral Oral Oral Oral  SpO2: 99% 95% 100% 94%  Weight:      Height:       Physical Exam Gen: lying flat comfortably  Eyes:  RRR ENT: MMM Neck: supple CV:  RRR Abd:  soft Lungs: clear ant GU: no foley Extr:  no edema, LUE AVF +t/b Neuro: grossly in tact  Additional Objective Labs: Basic Metabolic Panel: Recent Labs  Lab 07/21/22 1625 07/22/22 0116 07/23/22 0058 07/23/22 1625  NA 135 133* 135 135  K 4.8 5.9* 4.4 4.3  CL 95* 99 94* 98  CO2 26 20* 26  --   GLUCOSE 138* 170* 217* 133*  BUN 63* 66* 35* 40*  CREATININE 9.06* 9.24* 5.89* 7.70*  CALCIUM 9.3 8.8* 9.0  --   PHOS  --  6.6*  --   --     Liver Function Tests: Recent Labs  Lab 07/22/22 0116  ALBUMIN 2.9*    No results for input(s): "LIPASE", "AMYLASE" in the last 168 hours. CBC: Recent Labs  Lab 07/21/22 1625 07/22/22 0655 07/22/22 0858 07/23/22 0058 07/23/22 1625  WBC 12.1* 12.7* 11.2* 11.8*  --   NEUTROABS 9.7*  --   --   --   --   HGB 11.3* 10.4* 10.0* 10.9* 12.2*  HCT 33.8* 31.4* 30.4* 34.1* 36.0*  MCV 93.9 92.6 92.7 93.7  --   PLT 392 388 370 380  --     Blood Culture    Component Value Date/Time   SDES BLOOD RIGHT HAND 10/23/2020 1801   SDES SITE NOT SPECIFIED 10/23/2020 1801   SPECREQUEST  10/23/2020 1801    BOTTLES DRAWN AEROBIC AND ANAEROBIC Blood Culture adequate volume   SPECREQUEST  10/23/2020 1801    BOTTLES DRAWN AEROBIC AND ANAEROBIC Blood Culture results may not be optimal due to an inadequate volume of blood received in culture bottles   CULT  10/23/2020 1801    NO GROWTH 5 DAYS Performed at [facility], [address],  [place], [place] [address]    CULT  10/23/2020 1801    NO GROWTH 5 DAYS Performed at [facility], [address], [place], [place] [address]    REPTSTATUS 10/28/2020 FINAL 10/23/2020 1801   REPTSTATUS 10/28/2020 FINAL 10/23/2020 1801    Cardiac Enzymes: No results for input(s): "CKTOTAL", "CKMB", "CKMBINDEX", "TROPONINI" in the last 168 hours. CBG: Recent Labs  Lab 07/21/22 2131 07/22/22 0719 07/23/22 2011 07/25/22 0758 07/26/22 0800  GLUCAP 115* 151* 126* 186* 141*    Iron Studies: No results for input(s): "IRON", "TIBC", "TRANSFERRIN", "FERRITIN" in the last 72 hours. @ Studies/Results: No results found. Medications:  sodium chloride 150 mL/hr at 07/24/22 0457   methocarbamol (ROBAXIN) IV      aspirin  81 mg Oral BID   calcitRIOL  1.25 mcg Oral Q M,W,F-HD   carvedilol  12.5 mg Oral BID WC   Chlorhexidine Gluconate Cloth  6 each Topical Daily   docusate sodium  100 mg Oral BID   hydrALAZINE  50 mg Oral BID   rosuvastatin  10 mg Oral QPM  senna  1 tablet Oral BID   sodium zirconium cyclosilicate  10 g Oral Once    Dialysis Orders:  4hrs, EDW 96.5kg, BFR 400, DFR 500 Heparin 500/hr Mircera 30 q2wks - last dose given 4/8; venofer 50 weekly given 4/8 Calcitriol 1.48mcg TIW   Assessment/Plan **Hip fracture: s/p operative repair 4/11, dispo being arranged   **ESRD on HD:  MWF - initial difficulty cannulating but on reattempt was successfully cannulated on 4/10.  HD went fine 4/12 - next planned 4/15   **Anemia: very mild, had ESA last week outpt   **Secondary hyperPTH: cont calcitriol and binder here   **HTN: cont home meds   Will follow, reach out with concerns.  OK for d/c from nephrology perspective.  Estill Bakes MD 07/26/2022, 1:10 PM  [facility] Pager: [phone]

## 2022-07-26 NOTE — Progress Notes (Signed)
[facility]  PROGRESS NOTE  KAVEH KISSINGER WUJ:[id number] DOB: 1941-02-10 DOA: 07/21/2022 PCP: Benita Stabile, MD   Brief HPI:   82 year old male with medical history of ESRD, on HD MWF, diabetes mellitus type 2, hyperlipidemia, hypertension, anemia of chronic disease who presented to ED with left hip fracture that was found at orthopedic office today.  Patient fell at the [facility] on 3/29 in the ED.  At that time no imaging was obtained.  He was advised to follow-up with orthopedic surgeon.  Patient has been wheelchair since fall.  He saw orthopedic for today perform left hip x-ray as he was having continued left hip pain with difficulty walking left hip x-ray showed acute fracture. Orthopedic surgeon was consulted, recommended to get hemodialysis at [facility] and then transferred to [facility] long for surgery.    Subjective   Patient seen and examined, no new complaints.  Awaiting bed at nursing facility.   Assessment/Plan:    Left hip fracture, s/p left total hip arthroplasty -X-ray showed displaced acute subcapital fracture of left hip -Initial plan was to get surgery at [facility] long after hemodialysis -hemodialysis got delayed yesterday due to poor access issues -Underwent left total hip arthroplasty at [facility] on 07/23/2022 -Orthopedics following -PT consulted, plan to go to skilled nursing facility for rehab.   Hypertension -Continue Coreg 12.5 mg p.o. twice daily -continue  hydralazine 50 mg p.o. twice daily  Hyperlipidemia -Continue Crestor  Hyperkalemia/ESRD -Nephrology following -Getting hemodialysis Monday Wednesday Friday  Anemia of chronic disease -Hemoglobin is stable at 10.0  Diet-controlled diabetes mellitus -A1c 5.7     Medications     aspirin  81 mg Oral BID   calcitRIOL  1.25 mcg Oral Q M,W,F-HD   carvedilol  12.5 mg Oral BID WC   Chlorhexidine Gluconate Cloth  6 each Topical Daily   docusate sodium  100 mg Oral BID   hydrALAZINE  50 mg Oral  BID   rosuvastatin  10 mg Oral QPM   senna  1 tablet Oral BID   sodium zirconium cyclosilicate  10 g Oral Once     Data Reviewed:   CBG:  Recent Labs  Lab 07/21/22 2131 07/22/22 0719 07/23/22 2011 07/25/22 0758 07/26/22 0800  GLUCAP 115* 151* 126* 186* 141*    SpO2: 94 %    Vitals:   07/25/22 1737 07/25/22 2051 07/26/22 0444 07/26/22 0816  BP: 135/61 (!) 122/55 (!) 131/53 (!) 141/52  Pulse: 70 65 64 70  Resp: Temp: 98.6 F (37 C) 98.1 F (36.7 C) 98 F (36.7 C) 98.3 F (36.8 C)  TempSrc: Oral Oral Oral Oral  SpO2: 99% 95% 100% 94%  Weight:      Height:          Data Reviewed:  Basic Metabolic Panel: Recent Labs  Lab 07/21/22 1625 07/22/22 0116 07/23/22 0058 07/23/22 1625  NA 135 133* 135 135  K 4.8 5.9* 4.4 4.3  CL 95* 99 94* 98  CO2 26 20* 26  --   GLUCOSE 138* 170* 217* 133*  BUN 63* 66* 35* 40*  CREATININE 9.06* 9.24* 5.89* 7.70*  CALCIUM 9.3 8.8* 9.0  --   PHOS  --  6.6*  --   --     CBC: Recent Labs  Lab 07/21/22 1625 07/22/22 0655 07/22/22 0858 07/23/22 0058 07/23/22 1625  WBC 12.1* 12.7* 11.2* 11.8*  --   NEUTROABS 9.7*  --   --   --   --  HGB 11.3* 10.4* 10.0* 10.9* 12.2*  HCT 33.8* 31.4* 30.4* 34.1* 36.0*  MCV 93.9 92.6 92.7 93.7  --   PLT 392 388 370 380  --     LFT Recent Labs  Lab 07/22/22 0116  ALBUMIN 2.9*     Antibiotics: Anti-infectives (From admission, onward)    Start     Dose/Rate Route Frequency Ordered Stop   07/24/22 0200  ceFAZolin (ANCEF) IVPB 2g/100 mL premix  Status:  Discontinued        2 g 200 mL/hr over 30 Minutes Intravenous Every 6 hours 07/24/22 0149 07/24/22 0202   07/23/22 1500  ceFAZolin (ANCEF) IVPB 2g/100 mL premix        2 g 200 mL/hr over 30 Minutes Intravenous On call to O.R. 07/23/22 0115 07/23/22 1825        DVT prophylaxis: Aspirin 81 mg twice daily started per orthopedics  Code Status: Full code  Family Communication: No family at bedside   CONSULTS     Objective    Physical Examination:  General-appears in no acute distress Heart-S1-S2, regular, no murmur auscultated Lungs-clear to auscultation bilaterally Abdomen-soft, nontender, no organomegaly Extremities-no edema in the lower extremities Neuro-alert, oriented x3, no focal deficit noted  Status is: Inpatient:           Meredeth Ide   [facility] If 7PM-7AM, please contact night-coverage at [email or web address], Office  [phone]   07/26/2022, 11:02 AM  LOS: 5 days

## 2022-07-27 DIAGNOSIS — S72002A Fracture of unspecified part of neck of left femur, initial encounter for closed fracture: Secondary | ICD-10-CM | POA: Diagnosis not present

## 2022-07-27 DIAGNOSIS — I10 Essential (primary) hypertension: Secondary | ICD-10-CM | POA: Diagnosis not present

## 2022-07-27 DIAGNOSIS — D631 Anemia in chronic kidney disease: Secondary | ICD-10-CM | POA: Diagnosis not present

## 2022-07-27 DIAGNOSIS — N186 End stage renal disease: Secondary | ICD-10-CM | POA: Diagnosis not present

## 2022-07-27 LAB — CBC WITH DIFFERENTIAL/PLATELET
Abs Immature Granulocytes: 0.05 10*3/uL (ref 0.00–0.07)
Basophils Absolute: 0 10*3/uL (ref 0.0–0.1)
Basophils Relative: 0 %
Eosinophils Absolute: 0.4 10*3/uL (ref 0.0–0.5)
Eosinophils Relative: 3 %
HCT: 24.7 % — ABNORMAL LOW (ref 39.0–52.0)
Hemoglobin: 8.1 g/dL — ABNORMAL LOW (ref 13.0–17.0)
Immature Granulocytes: 0 %
Lymphocytes Relative: 8 %
Lymphs Abs: 1 10*3/uL (ref 0.7–4.0)
MCH: 30.5 pg (ref 26.0–34.0)
MCHC: 32.8 g/dL (ref 30.0–36.0)
MCV: 92.9 fL (ref 80.0–100.0)
Monocytes Absolute: 1.4 10*3/uL — ABNORMAL HIGH (ref 0.1–1.0)
Monocytes Relative: 12 %
Neutro Abs: 9 10*3/uL — ABNORMAL HIGH (ref 1.7–7.7)
Neutrophils Relative %: 77 %
Platelets: 429 10*3/uL — ABNORMAL HIGH (ref 150–400)
RBC: 2.66 MIL/uL — ABNORMAL LOW (ref 4.22–5.81)
RDW: 14 % (ref 11.5–15.5)
WBC: 11.8 10*3/uL — ABNORMAL HIGH (ref 4.0–10.5)
nRBC: 0 % (ref 0.0–0.2)

## 2022-07-27 LAB — RENAL FUNCTION PANEL
Albumin: 2.4 g/dL — ABNORMAL LOW (ref 3.5–5.0)
Anion gap: 14 (ref 5–15)
BUN: 73 mg/dL — ABNORMAL HIGH (ref 8–23)
CO2: 23 mmol/L (ref 22–32)
Calcium: 8.3 mg/dL — ABNORMAL LOW (ref 8.9–10.3)
Chloride: 93 mmol/L — ABNORMAL LOW (ref 98–111)
Creatinine, Ser: 10.74 mg/dL — ABNORMAL HIGH (ref 0.61–1.24)
GFR, Estimated: 4 mL/min — ABNORMAL LOW (ref >60)
Glucose, Bld: 151 mg/dL — ABNORMAL HIGH (ref 70–99)
Phosphorus: 7.5 mg/dL — ABNORMAL HIGH (ref 2.5–4.6)
Potassium: 4 mmol/L (ref 3.5–5.1)
Sodium: 130 mmol/L — ABNORMAL LOW (ref 135–145)

## 2022-07-27 NOTE — Progress Notes (Signed)
   07/27/22 1144  Vitals  Temp 98.1 F (36.7 C)  Pulse Rate 80  Resp 19  BP (!) 173/60  SpO2 99 %  O2 Device Room Air  Weight 91.8 kg  Type of Weight Pre-Dialysis  Post Treatment  Dialyzer Clearance Lightly streaked  Duration of HD Treatment -hour(s) 3.25 hour(s)  Hemodialysis Intake (mL) 0 mL  Liters Processed 71.8  Fluid Removed (mL) 2000 mL  Tolerated HD Treatment Yes  AVG/AVF Arterial Site Held (minutes) 10 minutes  AVG/AVF Venous Site Held (minutes) 10 minutes   Received patient in bed to unit.  Alert and oriented.  Informed consent signed and in chart.   TX duration:3.15  Patient tolerated well.  Transported back to the room  Alert, without acute distress.  Hand-off given to patient's nurse.   Access used: L Total UF removed: 2000 Medication(s) given: dilaudid 10mg  PO x 1[facility]

## 2022-07-27 NOTE — Progress Notes (Signed)
PRN dilaudid IV given to pt per request

## 2022-07-27 NOTE — Progress Notes (Signed)
[facility]  PROGRESS NOTE  Trevor [name] VUY:[id number] DOB: Feb 06, 1941 DOA: 07/21/2022 PCP: Benita Stabile, MD   Brief HPI:   82 year old male with medical history of ESRD, on HD MWF, diabetes mellitus type 2, hyperlipidemia, hypertension, anemia of chronic disease who presented to ED with left hip fracture that was found at orthopedic office today.  Patient fell at the [facility] on 3/29 in the ED.  At that time no imaging was obtained.  He was advised to follow-up with orthopedic surgeon.  Patient has been wheelchair since fall.  He saw orthopedic for today perform left hip x-ray as he was having continued left hip pain with difficulty walking left hip x-ray showed acute fracture. Orthopedic surgeon was consulted, recommended to get hemodialysis at [facility] and then transferred to [facility] long for surgery.    Subjective   Patient seen and examined, denies pain.   Assessment/Plan:    Left hip fracture, s/p left total hip arthroplasty -X-ray showed displaced acute subcapital fracture of left hip -Initial plan was to get surgery at [facility] long after hemodialysis -hemodialysis got delayed yesterday due to poor access issues -Underwent left total hip arthroplasty at [facility] on 07/23/2022 -Orthopedics following -PT consulted, plan to go to skilled nursing facility for rehab.   Hypertension -Continue Coreg 12.5 mg p.o. twice daily -continue  hydralazine 50 mg p.o. twice daily  Hyperlipidemia -Continue Crestor  Hyperkalemia/ESRD -Nephrology following -Getting hemodialysis Monday Wednesday Friday  Anemia of chronic disease -Hemoglobin is 8.1 today  Diet-controlled diabetes mellitus -A1c 5.7   Will discharge to skilled nursing facility in a.m.  Medications     aspirin  81 mg Oral BID   calcitRIOL  1.25 mcg Oral Q M,W,F-HD   carvedilol  12.5 mg Oral BID WC   Chlorhexidine Gluconate Cloth  6 each Topical Daily   docusate sodium  100 mg Oral BID   hydrALAZINE  50 mg  Oral BID   rosuvastatin  10 mg Oral QPM   senna  1 tablet Oral BID   sodium zirconium cyclosilicate  10 g Oral Once     Data Reviewed:   CBG:  Recent Labs  Lab 07/21/22 2131 07/22/22 0719 07/23/22 2011 07/25/22 0758 07/26/22 0800  GLUCAP 115* 151* 126* 186* 141*    SpO2: 95 %    Vitals:   07/27/22 0815 07/27/22 0830 07/27/22 0900 07/27/22 0930  BP: (!) 134/117 138/72 (!) 141/67 (!) 155/48  Pulse: 68 89 70 67  Resp: 18 17 17 13   Temp:      TempSrc:      SpO2:  93% 95%   Weight:      Height:          Data Reviewed:  Basic Metabolic Panel: Recent Labs  Lab 07/21/22 1625 07/22/22 0116 07/23/22 0058 07/23/22 1625  NA 135 133* 135 135  K 4.8 5.9* 4.4 4.3  CL 95* 99 94* 98  CO2 26 20* 26  --   GLUCOSE 138* 170* 217* 133*  BUN 63* 66* 35* 40*  CREATININE 9.06* 9.24* 5.89* 7.70*  CALCIUM 9.3 8.8* 9.0  --   PHOS  --  6.6*  --   --     CBC: Recent Labs  Lab 07/21/22 1625 07/22/22 0655 07/22/22 0858 07/23/22 0058 07/23/22 1625 07/27/22 0822  WBC 12.1* 12.7* 11.2* 11.8*  --  11.8*  NEUTROABS 9.7*  --   --   --   --  9.0*  HGB 11.3* 10.4* 10.0* 10.9* 12.2*  8.1*  HCT 33.8* 31.4* 30.4* 34.1* 36.0* 24.7*  MCV 93.9 92.6 92.7 93.7  --  92.9  PLT 392 388 370 380  --  429*    LFT Recent Labs  Lab 07/22/22 0116  ALBUMIN 2.9*     Antibiotics: Anti-infectives (From admission, onward)    Start     Dose/Rate Route Frequency Ordered Stop   07/24/22 0200  ceFAZolin (ANCEF) IVPB 2g/100 mL premix  Status:  Discontinued        2 g 200 mL/hr over 30 Minutes Intravenous Every 6 hours 07/24/22 0149 07/24/22 0202   07/23/22 1500  ceFAZolin (ANCEF) IVPB 2g/100 mL premix        2 g 200 mL/hr over 30 Minutes Intravenous On call to O.R. 07/23/22 0115 07/23/22 1825        DVT prophylaxis: Aspirin 81 mg twice daily started per orthopedics  Code Status: Full code  Family Communication: No family at bedside   CONSULTS    Objective    Physical  Examination:  General-appears in no acute distress Heart-S1-S2, regular, no murmur auscultated Lungs-clear to auscultation bilaterally, no wheezing or crackles auscultated Abdomen-soft, nontender, no organomegaly Extremities-no edema in the lower extremities Neuro-alert, oriented x3, no focal deficit noted  Status is: Inpatient:           Meredeth Ide   [facility] If 7PM-7AM, please contact night-coverage at [email or web address], Office  [phone]   07/27/2022, 9:40 AM  LOS: 6 days

## 2022-07-27 NOTE — Progress Notes (Signed)
Physical Therapy Treatment Patient Details Name: Trevor [name] MRN: [id number] DOB: 1940/10/28 Today's Date: 07/27/2022   History of Present Illness Pt is an 82 yo male who had a fall 10 days ago and followed up with orthopedic on 4/11 with findings of L hip fracture. Currently s/p L THA on 4/11 anterior approach. PMH: Anemia, CKD, DM, Hepatitis kidney stones, hyperlipidemia, HTN.    PT Comments    Pt continues to slowly progress towards goals. Pt was able to work from [employer] A to CGA for sit to stand with RW from slightly elevated EOB then recliner. Pt was able to make transfer today at Mod A; continues with difficulty WB through the LLE pivoting on the R forefoot to get to recliner and requiring heavy verbal cues to prevent sitting early demonstrating a decreased safety awareness. Continuerecommending skilled physical therapy services at a higher level of care and frequency at 5x/week in order to work on functional mobility, strength, balance in order to return to decreased level of assistance to return home. No signs/symptoms of cardiac/respiratory distress during session.     Recommendations for follow up therapy are one component of a multi-disciplinary discharge planning process, led by the attending physician.  Recommendations may be updated based on patient status, additional functional criteria and insurance authorization.  Follow Up Recommendations  Can patient physically be transported by private vehicle: No    Assistance Recommended at Discharge Intermittent Supervision/Assistance  Patient can return home with the following Help with stairs or ramp for entrance;Assist for transportation;Assistance with cooking/housework;A lot of help with walking and/or transfers   Equipment Recommendations  None recommended by PT    Recommendations for Other Services       Precautions / Restrictions Precautions Precautions: Fall Restrictions Weight Bearing Restrictions: Yes LLE Weight  Bearing: Weight bearing as tolerated     Mobility  Bed Mobility Overal bed mobility: Needs Assistance Bed Mobility: Supine to Sit     Supine to sit: Min assist     General bed mobility comments: Min A for LLE to get to EOB. Min A at trunk to get to upright position Patient Response: Cooperative  Transfers Overall transfer level: Needs assistance Equipment used: Rolling walker (2 wheels) Transfers: Sit to/from Stand, Bed to chair/wheelchair/BSC Sit to Stand: Min assist, Min guard, From elevated surface   Step pivot transfers: Mod assist       General transfer comment: from slightly elevated EOB x3 with min A progressing to min guard. Pt demonstrating faster and smoother power up with each stand and then performed 3x at [facility] from recliner with good use of hands following education. MOd A for transfer due to heavy cueing, pivoting on the RLE and stepping with the LLE. Pt tried to sit to early and required physical assist to prevent with Max cues for safety to prevent fall.    Ambulation/Gait     General Gait Details: Pt was able to take a couple of steps toward recliner for transfer. Pivoting on R forefoot and taking small steps with LLE.       Balance Overall balance assessment: Needs assistance Sitting-balance support: Bilateral upper extremity supported, Feet supported Sitting balance-Leahy Scale: Fair Sitting balance - Comments: sitting EOB with posterior lean.   Standing balance support: Bilateral upper extremity supported, Reliant on assistive device for balance Standing balance-Leahy Scale: Poor Standing balance comment: with RW support        Cognition Arousal/Alertness: Awake/alert Behavior During Therapy: WFL for tasks assessed/performed, Flat affect Overall  Cognitive Status: Difficult to assess         General Comments: Pt seemed a little confused about safety this session and discharge plan           General Comments General comments (skin  integrity, edema, etc.): Pt recieved pain medication prior to session. ON sitting pt states he is dizzy and leaning posteriorly, extra time to be able to tolerate standing and transfer.      Pertinent Vitals/Pain Pain Assessment Pain Assessment: Faces Faces Pain Scale: Hurts a little bit Pain Location: L hip Pain Descriptors / Indicators: Guarding, Operative site guarding Pain Intervention(s): Monitored during session     PT Goals (current goals can now be found in the care plan section) Acute Rehab PT Goals Patient Stated Goal: to return home PT Goal Formulation: With patient Time For Goal Achievement: 08/07/22 Potential to Achieve Goals: Fair Progress towards PT goals: Progressing toward goals    Frequency    Min 5X/week      PT Plan Current plan remains appropriate       AM-PAC PT "6 Clicks" Mobility   Outcome Measure  Help needed turning from your back to your side while in a flat bed without using bedrails?: A Little Help needed moving from lying on your back to sitting on the side of a flat bed without using bedrails?: A Little Help needed moving to and from a bed to a chair (including a wheelchair)?: A Lot Help needed standing up from a chair using your arms (e.g., wheelchair or bedside chair)?: A Little Help needed to walk in hospital room?: A Lot Help needed climbing 3-5 steps with a railing? : Total 6 Click Score: 14    End of Session Equipment Utilized During Treatment: Gait belt Activity Tolerance: Other (comment) (pt slightly limited by dizziness.)   Nurse Communication: Mobility status PT Visit Diagnosis: Other abnormalities of gait and mobility (R26.89);Unsteadiness on feet (R26.81);Muscle weakness (generalized) (M62.81)     Time: 4854-6270 PT Time Calculation (min) (ACUTE ONLY): 39 min  Charges:  $Therapeutic Activity: 38-52 mins                    Trevor [name], DPT, CLT  Acute Rehabilitation Services Office: [phone] (Secure chat  preferred)    Claudia Desanctis 07/27/2022, 3:16 PM

## 2022-07-27 NOTE — TOC Progression Note (Signed)
Transition of Care [facility]) - Progression Note    Patient Details  Name: Trevor [name] MRN: [id number] Date of Birth: [birth year]  Transition of Care [facility]) CM/SW Contact  Carley Hammed, [place] Phone Number: 07/27/2022, 1:46 PM  Clinical Narrative:    CSW notified that pt is medically ready to DC. Pt chose [facility], they will have a bed tomorrow morning. Pt's authorization has been approved. Medical team and pt notified of plan. CSW to notify renal navigator tomorrow, when back in the office. Pt agreeable to PTAR transport. TOC will continue to follow for DC needs.    Expected Discharge Plan: Skilled Nursing Facility Barriers to Discharge: Continued Medical Work up, English as a second language teacher, SNF Pending bed offer (SNF with dialysis transport)  Expected Discharge Plan and Services In-house Referral: Clinical Social Work   Post Acute Care Choice: Skilled Nursing Facility Living arrangements for the past 2 months: Single Family Home                                       Social Determinants of Health (SDOH) Interventions SDOH Screenings   Tobacco Use: Low Risk  (07/24/2022)    Readmission Risk Interventions     No data to display

## 2022-07-27 NOTE — Progress Notes (Signed)
[facility] Progress Note   Subjective:   patient seen and examined in HD unit. Just finished with HD, uneventful. Uf'ed 2L  Objective Vitals:   07/27/22 1030 07/27/22 1100 07/27/22 1130 07/27/22 1136  BP: (!) 144/52 (!) 151/80 125/70 132/74  Pulse: 77 80 76 79  Resp: 19 15 (!) 21 15  Temp:      TempSrc:      SpO2:      Weight:      Height:       Physical Exam Gen: lying flat comfortably  Eyes:  RRR ENT: MMM Neck: supple CV:  RRR Abd:  soft Lungs: clear ant GU: no foley Extr:  no edema, LUE AVF +t/b Neuro: grossly in tact Dialysis access: LUE AVF  Additional Objective Labs: Basic Metabolic Panel: Recent Labs  Lab 07/22/22 0116 07/23/22 0058 07/23/22 1625 07/27/22 0822  NA 133* 135 135 130*  K 5.9* 4.4 4.3 4.0  CL 99 94* 98 93*  CO2 20* 26  --  23  GLUCOSE 170* 217* 133* 151*  BUN 66* 35* 40* 73*  CREATININE 9.24* 5.89* 7.70* 10.74*  CALCIUM 8.8* 9.0  --  8.3*  PHOS 6.6*  --   --  7.5*   Liver Function Tests: Recent Labs  Lab 07/22/22 0116 07/27/22 0822  ALBUMIN 2.9* 2.4*   No results for input(s): "LIPASE", "AMYLASE" in the last 168 hours. CBC: Recent Labs  Lab 07/21/22 1625 07/22/22 0655 07/22/22 0858 07/23/22 0058 07/23/22 1625 07/27/22 0822  WBC 12.1* 12.7* 11.2* 11.8*  --  11.8*  NEUTROABS 9.7*  --   --   --   --  9.0*  HGB 11.3* 10.4* 10.0* 10.9* 12.2* 8.1*  HCT 33.8* 31.4* 30.4* 34.1* 36.0* 24.7*  MCV 93.9 92.6 92.7 93.7  --  92.9  PLT 392 388 370 380  --  429*   Blood Culture    Component Value Date/Time   SDES BLOOD RIGHT HAND 10/23/2020 1801   SDES SITE NOT SPECIFIED 10/23/2020 1801   SPECREQUEST  10/23/2020 1801    BOTTLES DRAWN AEROBIC AND ANAEROBIC Blood Culture adequate volume   SPECREQUEST  10/23/2020 1801    BOTTLES DRAWN AEROBIC AND ANAEROBIC Blood Culture results may not be optimal due to an inadequate volume of blood received in culture bottles   CULT  10/23/2020 1801    NO GROWTH 5 DAYS Performed  at [facility], [address] [place], [place] [address]    CULT  10/23/2020 1801    NO GROWTH 5 DAYS Performed at [facility], [address], [place], [place] [address]    REPTSTATUS 10/28/2020 FINAL 10/23/2020 1801   REPTSTATUS 10/28/2020 FINAL 10/23/2020 1801    Cardiac Enzymes: No results for input(s): "CKTOTAL", "CKMB", "CKMBINDEX", "TROPONINI" in the last 168 hours. CBG: Recent Labs  Lab 07/21/22 2131 07/22/22 0719 07/23/22 2011 07/25/22 0758 07/26/22 0800  GLUCAP 115* 151* 126* 186* 141*   Iron Studies: No results for input(s): "IRON", "TIBC", "TRANSFERRIN", "FERRITIN" in the last 72 hours. @lablastinr3 @ Studies/Results: No results found. Medications:  sodium chloride 150 mL/hr at 07/24/22 0457   methocarbamol (ROBAXIN) IV      aspirin  81 mg Oral BID   calcitRIOL  1.25 mcg Oral Q M,W,F-HD   carvedilol  12.5 mg Oral BID WC   Chlorhexidine Gluconate Cloth  6 each Topical Daily   docusate sodium  100 mg Oral BID   hydrALAZINE  50 mg Oral BID   rosuvastatin  10 mg  Oral QPM   senna  1 tablet Oral BID   sodium zirconium cyclosilicate  10 g Oral Once    Dialysis Orders:  4hrs, EDW 96.5kg, BFR 400, DFR 500 Heparin 500/hr Mircera 30 q2wks - last dose given 4/8; venofer 50 weekly given 4/8 Calcitriol 1.88mcg TIW   Assessment/Plan **Hip fracture: s/p operative repair 4/11, dispo being arranged   **ESRD on HD:  MWF - initial difficulty cannulating but on reattempt was successfully cannulated on 4/10.  C/w HD MWF   **Anemia: was very mild, had ESA last week outpt. Hgb dropped to 8.1 (post op changes), will check iron panel   **Secondary hyperPTH: cont calcitriol and binder here   **HTN: cont home meds   Will follow, reach out with concerns.  OK for d/c from nephrology perspective.  Anthony Sar, MD [facility]

## 2022-07-27 NOTE — Progress Notes (Signed)
Pt transported to HD.

## 2022-07-27 NOTE — Progress Notes (Signed)
Prn zofran given to pt per request

## 2022-07-28 DIAGNOSIS — E119 Type 2 diabetes mellitus without complications: Secondary | ICD-10-CM | POA: Diagnosis not present

## 2022-07-28 DIAGNOSIS — E1121 Type 2 diabetes mellitus with diabetic nephropathy: Secondary | ICD-10-CM | POA: Diagnosis not present

## 2022-07-28 DIAGNOSIS — D631 Anemia in chronic kidney disease: Secondary | ICD-10-CM | POA: Diagnosis not present

## 2022-07-28 DIAGNOSIS — S72032D Displaced midcervical fracture of left femur, subsequent encounter for closed fracture with routine healing: Secondary | ICD-10-CM | POA: Diagnosis not present

## 2022-07-28 DIAGNOSIS — R2689 Other abnormalities of gait and mobility: Secondary | ICD-10-CM | POA: Diagnosis not present

## 2022-07-28 DIAGNOSIS — E7849 Other hyperlipidemia: Secondary | ICD-10-CM | POA: Diagnosis not present

## 2022-07-28 DIAGNOSIS — S72002D Fracture of unspecified part of neck of left femur, subsequent encounter for closed fracture with routine healing: Secondary | ICD-10-CM | POA: Diagnosis not present

## 2022-07-28 DIAGNOSIS — S72002A Fracture of unspecified part of neck of left femur, initial encounter for closed fracture: Secondary | ICD-10-CM | POA: Diagnosis not present

## 2022-07-28 DIAGNOSIS — M6281 Muscle weakness (generalized): Secondary | ICD-10-CM | POA: Diagnosis not present

## 2022-07-28 DIAGNOSIS — Z7401 Bed confinement status: Secondary | ICD-10-CM | POA: Diagnosis not present

## 2022-07-28 DIAGNOSIS — E785 Hyperlipidemia, unspecified: Secondary | ICD-10-CM | POA: Diagnosis not present

## 2022-07-28 DIAGNOSIS — N2581 Secondary hyperparathyroidism of renal origin: Secondary | ICD-10-CM | POA: Diagnosis not present

## 2022-07-28 DIAGNOSIS — I10 Essential (primary) hypertension: Secondary | ICD-10-CM | POA: Diagnosis not present

## 2022-07-28 DIAGNOSIS — R531 Weakness: Secondary | ICD-10-CM | POA: Diagnosis not present

## 2022-07-28 DIAGNOSIS — N186 End stage renal disease: Secondary | ICD-10-CM | POA: Diagnosis not present

## 2022-07-28 DIAGNOSIS — Z992 Dependence on renal dialysis: Secondary | ICD-10-CM | POA: Diagnosis not present

## 2022-07-28 DIAGNOSIS — Z9181 History of falling: Secondary | ICD-10-CM | POA: Diagnosis not present

## 2022-07-28 DIAGNOSIS — R41841 Cognitive communication deficit: Secondary | ICD-10-CM | POA: Diagnosis not present

## 2022-07-28 DIAGNOSIS — M80852A Other osteoporosis with current pathological fracture, left femur, initial encounter for fracture: Secondary | ICD-10-CM | POA: Diagnosis not present

## 2022-07-28 LAB — IRON AND TIBC
Iron: 15 ug/dL — ABNORMAL LOW (ref 45–182)
Saturation Ratios: 10 % — ABNORMAL LOW (ref 17.9–39.5)
TIBC: 153 ug/dL — ABNORMAL LOW (ref 250–450)
UIBC: 138 ug/dL

## 2022-07-28 LAB — FERRITIN: Ferritin: 757 ng/mL — ABNORMAL HIGH (ref 24–336)

## 2022-07-28 MED ORDER — OXYCODONE HCL 5 MG PO TABS: 5.0000 mg | ORAL_TABLET | ORAL | 0 refills | Status: DC | PRN

## 2022-07-28 MED ORDER — ASPIRIN 81 MG PO CHEW: 81.0000 mg | CHEWABLE_TABLET | Freq: Two times a day (BID) | ORAL | 0 refills | Status: AC

## 2022-07-28 MED ORDER — SENNA 8.6 MG PO TABS: 1.0000 | ORAL_TABLET | Freq: Two times a day (BID) | ORAL | 0 refills | Status: DC

## 2022-07-28 MED ORDER — ONDANSETRON HCL 4 MG PO TABS: 4.0000 mg | ORAL_TABLET | Freq: Four times a day (QID) | ORAL | 0 refills | Status: DC | PRN

## 2022-07-28 MED ORDER — DOCUSATE SODIUM 100 MG PO CAPS: 100.0000 mg | ORAL_CAPSULE | Freq: Two times a day (BID) | ORAL | 0 refills | Status: DC

## 2022-07-28 MED ORDER — POLYETHYLENE GLYCOL 3350 17 G PO PACK: 17.0000 g | PACK | Freq: Every day | ORAL | 0 refills | Status: DC | PRN

## 2022-07-28 MED ORDER — ACETAMINOPHEN 325 MG PO TABS: 325.0000 mg | ORAL_TABLET | Freq: Four times a day (QID) | ORAL | Status: AC | PRN

## 2022-07-28 NOTE — Progress Notes (Signed)
Contacted by CSW this am regarding pt's d/c to snf today. Contacted [facility]  and advised staff of pt's d/c to snf today and that pt should resume care tomorrow. D/C summary and renal note from 4/15 faxed to clinic for continuation of care. Clinic advised that pt will d/c to [facility] snf and facility advised of pt's clinic and schedule per CSW.   Olivia Canter Renal Navigator [phone]

## 2022-07-28 NOTE — Progress Notes (Signed)
Pt discharged via PTAR via stretcher with all belongings, paperwork, and prescriptions.

## 2022-07-28 NOTE — Discharge Summary (Addendum)
Physician Discharge Summary   Patient: Trevor [name] MRN: [id number] DOB: 02-22-1941  Admit date:     07/21/2022  Discharge date: 07/28/22  Discharge Physician: Meredeth Ide   PCP: Benita Stabile, MD   Recommendations at discharge:   Follow-up orthopedics in 2 weeks Continue aspirin 81 mg p.o. twice daily for 30 days till 08/23/2022 Will need IV iron as outpatient   Discharge Diagnoses: Principal Problem:   Closed left hip fracture Active Problems:   Anemia   Essential hypertension   Hyperlipidemia   End stage renal disease  Resolved Problems:   * No resolved hospital problems. *  Hospital Course:  82 year old male with medical history of ESRD, on HD MWF, diabetes mellitus type 2, hyperlipidemia, hypertension, anemia of chronic disease who presented to ED with left hip fracture that was found at orthopedic office today.  Patient fell at the [facility] on 3/29 in the ED.  At that time no imaging was obtained.  He was advised to follow-up with orthopedic surgeon.  Patient has been wheelchair since fall.  He saw orthopedic for today perform left hip x-ray as he was having continued left hip pain with difficulty walking left hip x-ray showed acute fracture. Orthopedic surgeon was consulted, recommended to get hemodialysis at [facility] and then transferred to [facility] long for surgery.     Assessment and Plan:  Left hip fracture, s/p left total hip arthroplasty -X-ray showed displaced acute subcapital fracture of left hip -Initial plan was to get surgery at [facility] long after hemodialysis -hemodialysis got delayed yesterday due to poor access issues -Underwent left total hip arthroplasty at [facility] on 07/23/2022 per Dr [name] -PT consulted, plan to go to skilled nursing facility for rehab. -To discharge on aspirin 81 mg p.o. twice daily for 30 days till 08/23/2022. -Follow-up orthopedics in 2 weeks for wound Check and suture removal     Hypertension -Continue Coreg 12.5 mg p.o.  twice daily -continue  hydralazine 50 mg p.o. twice daily   Hyperlipidemia -Continue Crestor   Hyperkalemia/ESRD -Nephrology following -Getting hemodialysis Monday Wednesday Friday   Anemia of chronic disease -Hemoglobin is 8.1 today -Has iron deficiency with iron saturation 10% -Will need IV iron as outpatient   Diet-controlled diabetes mellitus -A1c 5.7          Consultants: Nephrology, orthopedics Procedures performed: Left total hip arthroplasty Disposition: Home Diet recommendation:  Discharge Diet Orders (From admission, onward)     Start     Ordered   07/28/22 0000  Diet - low sodium heart healthy        07/28/22 0958           Regular diet DISCHARGE MEDICATION: Allergies as of 07/28/2022   No Known Allergies      Medication List     STOP taking these medications    oxyCODONE-acetaminophen 5-325 MG tablet Commonly known as: PERCOCET/ROXICET       TAKE these medications    Accu-Chek Aviva Plus test strip Generic drug: glucose blood USE TO TEST BLOOD SUGAR BEFORE BREAKFAST AND AT BEDTIME   Accu-Chek Softclix Lancets lancets   acetaminophen 325 MG tablet Commonly known as: TYLENOL Take 1-2 tablets (325-650 mg total) by mouth every 6 (six) hours as needed for mild pain (pain score 1-3 or temp > 100.5). What changed:  medication strength how much to take reasons to take this   aspirin 81 MG chewable tablet Chew 1 tablet (81 mg total) by mouth 2 (two) times daily.  B-D SINGLE USE SWABS REGULAR Pads   carvedilol 12.5 MG tablet Commonly known as: COREG Take 12.5 mg by mouth 2 (two) times daily with a meal.   docusate sodium 100 MG capsule Commonly known as: COLACE Take 1 capsule (100 mg total) by mouth 2 (two) times daily.   hydrALAZINE 100 MG tablet Commonly known as: APRESOLINE Take 50 mg by mouth 2 (two) times daily.   ondansetron 4 MG tablet Commonly known as: ZOFRAN Take 1 tablet (4 mg total) by mouth every 6 (six) hours  as needed for nausea.   OneTouch Verio w/Device Kit 1 each by Does not apply route as needed.   oxyCODONE 5 MG immediate release tablet Commonly known as: Oxy IR/ROXICODONE Take 1-2 tablets (5-10 mg total) by mouth every 4 (four) hours as needed for moderate pain (pain score 4-6). What changed:  how much to take when to take this reasons to take this   polyethylene glycol 17 g packet Commonly known as: MIRALAX / GLYCOLAX Take 17 g by mouth daily as needed for mild constipation.   rosuvastatin 10 MG tablet Commonly known as: CRESTOR Take 10 mg by mouth every evening.   senna 8.6 MG Tabs tablet Commonly known as: SENOKOT Take 1 tablet (8.6 mg total) by mouth 2 (two) times daily.        Contact information for follow-up providers     [place], Alain Honey, PA-C. Schedule an appointment as soon as possible for a visit in 2 week(s).   Specialty: Orthopedic Surgery Why: For suture removal, For wound re-check Contact information: [address] [place] [address] [phone]              Contact information for after-discharge care     Destination     [facility] Preferred SNF .   Service: Skilled Nursing Contact information: [address] [place] [address] [phone]                    Discharge Exam: Ceasar Mons Weights   07/22/22 0018 07/27/22 0755 07/27/22 1144  Weight: 97.8 kg 93.8 kg 91.8 kg   General-appears in no acute distress Heart-S1-S2, regular, no murmur auscultated Lungs-clear to auscultation bilaterally, no wheezing or crackles auscultated Abdomen-soft, nontender, no organomegaly Extremities-no edema in the lower extremities Neuro-alert, oriented x3, no focal deficit noted  Condition at discharge: good  The results of significant diagnostics from this hospitalization (including imaging, microbiology, ancillary and laboratory) are listed below for reference.   Imaging Studies: DG Pelvis  Portable  Result Date: 07/23/2022 CLINICAL DATA:  Postop EXAM: PORTABLE PELVIS 1-2 VIEWS COMPARISON:  10/09/2020, 07/21/2022 FINDINGS: Previous right hip replacement. Interval left hip replacement with intact hardware and normal alignment. Gas in the soft tissues consistent with recent surgery IMPRESSION: Status post left hip replacement with expected postsurgical change. Electronically Signed   By: Jasmine Pang M.D.   On: 07/23/2022 20:50   DG HIP UNILAT WITH PELVIS 1V LEFT  Result Date: 07/23/2022 CLINICAL DATA:  Left femoral neck fracture EXAM: DG HIP (WITH OR WITHOUT PELVIS) 1V*L* COMPARISON:  07/21/2022 FLUOROSCOPY TIME:  Radiation Exposure Index (as provided by the fluoroscopic device): 0.9 mGy If the device does not provide the exposure index: Fluoroscopy Time:  13 seconds Number of Acquired Images:  2 FINDINGS: Left hip prosthesis is noted in satisfactory position. Previously placed right hip prosthesis is again noted. IMPRESSION: Status post left hip replacement Electronically Signed   By: Loraine Leriche  Lukens M.D.   On: 07/23/2022 20:20   DG C-Arm 1-60 Min-No Report  Result Date: 07/23/2022 Fluoroscopy was utilized by the requesting physician.  No radiographic interpretation.   DG C-Arm 1-60 Min-No Report  Result Date: 07/23/2022 Fluoroscopy was utilized by the requesting physician.  No radiographic interpretation.   DG HIP UNILAT WITH PELVIS 2-3 VIEWS LEFT  Result Date: 07/21/2022 Clinical: injury left knee and hip on 07-10-22. X-rays were done of the pelvis and left hip, three views. There is an acute subcapital fracture of the left hip displaced.  A total hip is on the right side well positioned.  Bone quality is good. Impression:  acute subcapital fracture of the left hip, displaced.  Total hip on the right. Electronically Signed [name], MD 4/9/202411:10 AM  CT Knee Left Wo Contrast  Result Date: 07/10/2022 CLINICAL DATA:  Mechanical fall with left knee pain EXAM: CT OF THE LEFT  KNEE WITHOUT CONTRAST TECHNIQUE: Multidetector CT imaging of the left knee was performed according to the standard protocol. Multiplanar CT image reconstructions were also generated. RADIATION DOSE REDUCTION: This exam was performed according to the departmental dose-optimization program which includes automated exposure control, adjustment of the mA and/or kV according to patient size and/or use of iterative reconstruction technique. COMPARISON:  Left knee radiograph dated 07/10/2018 FINDINGS: Bones/Joint/Cartilage Subtle subchondral sclerosis involving the bilateral tibial plateau without overlying cortical defect. Mild tricompartmental degenerative changes. Ligaments Suboptimally assessed by CT. Muscles and Tendons Intact. Soft tissues Mild prepatellar subcutaneous soft tissue edema. IMPRESSION: 1. Subtle subchondral sclerosis involving the bilateral tibial plateau without overlying cortical defect. Findings may represent stress-related changes. 2. Mild tricompartmental degenerative changes. 3. Mild prepatellar subcutaneous soft tissue edema. Electronically Signed   By: Agustin Cree M.D.   On: 07/10/2022 18:55   DG Knee Complete 4 Views Left  Result Date: 07/10/2022 CLINICAL DATA:  Pain after fall EXAM: LEFT KNEE - COMPLETE 4 VIEW COMPARISON:  None Available. FINDINGS: No fracture or dislocation. Preserved joint spaces and bone mineralization. Hyperostosis along the patella. No joint effusion on lateral view. Scattered vascular calcifications. IMPRESSION: No acute osseous abnormality Electronically Signed   By: Karen Kays M.D.   On: 07/10/2022 16:59    Microbiology: Results for orders placed or performed during the hospital encounter of 07/21/22  Surgical PCR screen     Status: None   Collection Time: 07/23/22  1:27 AM   Specimen: Nasal Mucosa; Nasal Swab  Result Value Ref Range Status   MRSA, PCR NEGATIVE NEGATIVE Final   Staphylococcus aureus NEGATIVE NEGATIVE Final    Comment: (NOTE) The Xpert SA  Assay (FDA approved for NASAL specimens in patients 86 years of age and older), is one component of a comprehensive surveillance program. It is not intended to diagnose infection nor to guide or monitor treatment. Performed at [facility], [address], [place], [place] [address]     Labs: CBC: Recent Labs  Lab 07/21/22 1625 07/22/22 6045 07/22/22 0858 07/23/22 0058 07/23/22 1625 07/27/22 0822  WBC 12.1* 12.7* 11.2* 11.8*  --  11.8*  NEUTROABS 9.7*  --   --   --   --  9.0*  HGB 11.3* 10.4* 10.0* 10.9* 12.2* 8.1*  HCT 33.8* 31.4* 30.4* 34.1* 36.0* 24.7*  MCV 93.9 92.6 92.7 93.7  --  92.9  PLT 392 388 370 380  --  429*   Basic Metabolic Panel: Recent Labs  Lab 07/21/22 1625 07/22/22 0116 07/23/22 0058 07/23/22 1625 07/27/22 0822  NA  135 133* 135 135 130*  K 4.8 5.9* 4.4 4.3 4.0  CL 95* 99 94* 98 93*  CO2 26 20* 26  --  23  GLUCOSE 138* 170* 217* 133* 151*  BUN 63* 66* 35* 40* 73*  CREATININE 9.06* 9.24* 5.89* 7.70* 10.74*  CALCIUM 9.3 8.8* 9.0  --  8.3*  PHOS  --  6.6*  --   --  7.5*   Liver Function Tests: Recent Labs  Lab 07/22/22 0116 07/27/22 0822  ALBUMIN 2.9* 2.4*   CBG: Recent Labs  Lab 07/21/22 2131 07/22/22 0719 07/23/22 2011 07/25/22 0758 07/26/22 0800  GLUCAP 115* 151* 126* 186* 141*    Discharge time spent: greater than 30 minutes.  Signed: Meredeth Ide, MD [facility] 07/28/2022

## 2022-07-28 NOTE — TOC Transition Note (Signed)
Transition of Care [facility]) - CM/SW Discharge Note   Patient Details  Name: AEDIN JEANSONNE MRN: [id number] Date of Birth: 05-24-1940  Transition of Care [facility]) CM/SW Contact:  Carley Hammed, LCSWA Phone Number: 07/28/2022, 10:25 AM   Clinical Narrative:    Pt to be transported via PTAR to [facility]. Nurse to call report to [phone]. Rm# [id number]   Final next level of care: Skilled Nursing Facility Barriers to Discharge: Barriers Resolved   Patient Goals and CMS Choice CMS Medicare.gov Compare Post Acute Care list provided to:: Patient Choice offered to / list presented to : Patient  Discharge Placement                Patient chooses bed at:  [facility]) Patient to be transferred to facility by: PTAR Name of family member notified: Pt to notify Patient and family notified of of transfer: 07/28/22  Discharge Plan and Services Additional resources added to the After Visit Summary for   In-house Referral: Clinical Social Work   Post Acute Care Choice: Skilled Nursing Facility                               Social Determinants of Health (SDOH) Interventions SDOH Screenings   Tobacco Use: Low Risk  (07/24/2022)     Readmission Risk Interventions     No data to display

## 2022-07-28 NOTE — Progress Notes (Signed)
Printed prescriptions placed in folder with discharge paperwork for discharge.

## 2022-07-28 NOTE — Progress Notes (Signed)
Pt stated that his dentures ae missing. Went missing after his surgery on 4/11. They were taken out of his mouth prior to leaving room for surgery and placed in bin on bed side table. When pt returned back to room they were no longer in bin

## 2022-07-28 NOTE — Progress Notes (Signed)
Report called to [facility] [phone]) Spoke with Nurse Zella Ball who will be taking care of patient in  Rm# [id number] when he arrives. Nurse verbalized understanding of report and had no further questions. [facility] called and should be here shortly.

## 2022-07-29 DIAGNOSIS — N2581 Secondary hyperparathyroidism of renal origin: Secondary | ICD-10-CM | POA: Diagnosis not present

## 2022-07-29 DIAGNOSIS — N186 End stage renal disease: Secondary | ICD-10-CM | POA: Diagnosis not present

## 2022-07-29 DIAGNOSIS — Z992 Dependence on renal dialysis: Secondary | ICD-10-CM | POA: Diagnosis not present

## 2022-07-30 DIAGNOSIS — M80852A Other osteoporosis with current pathological fracture, left femur, initial encounter for fracture: Secondary | ICD-10-CM | POA: Diagnosis not present

## 2022-07-30 DIAGNOSIS — E7849 Other hyperlipidemia: Secondary | ICD-10-CM | POA: Diagnosis not present

## 2022-07-30 DIAGNOSIS — E1121 Type 2 diabetes mellitus with diabetic nephropathy: Secondary | ICD-10-CM | POA: Diagnosis not present

## 2022-07-30 DIAGNOSIS — I10 Essential (primary) hypertension: Secondary | ICD-10-CM | POA: Diagnosis not present

## 2022-07-30 DIAGNOSIS — R531 Weakness: Secondary | ICD-10-CM | POA: Diagnosis not present

## 2022-07-31 DIAGNOSIS — N186 End stage renal disease: Secondary | ICD-10-CM | POA: Diagnosis not present

## 2022-07-31 DIAGNOSIS — Z992 Dependence on renal dialysis: Secondary | ICD-10-CM | POA: Diagnosis not present

## 2022-07-31 DIAGNOSIS — N2581 Secondary hyperparathyroidism of renal origin: Secondary | ICD-10-CM | POA: Diagnosis not present

## 2022-08-03 DIAGNOSIS — N2581 Secondary hyperparathyroidism of renal origin: Secondary | ICD-10-CM | POA: Diagnosis not present

## 2022-08-03 DIAGNOSIS — N186 End stage renal disease: Secondary | ICD-10-CM | POA: Diagnosis not present

## 2022-08-03 DIAGNOSIS — Z992 Dependence on renal dialysis: Secondary | ICD-10-CM | POA: Diagnosis not present

## 2022-08-04 DIAGNOSIS — S72032D Displaced midcervical fracture of left femur, subsequent encounter for closed fracture with routine healing: Secondary | ICD-10-CM | POA: Diagnosis not present

## 2022-08-05 DIAGNOSIS — Z992 Dependence on renal dialysis: Secondary | ICD-10-CM | POA: Diagnosis not present

## 2022-08-05 DIAGNOSIS — N2581 Secondary hyperparathyroidism of renal origin: Secondary | ICD-10-CM | POA: Diagnosis not present

## 2022-08-05 DIAGNOSIS — N186 End stage renal disease: Secondary | ICD-10-CM | POA: Diagnosis not present

## 2022-08-07 DIAGNOSIS — N2581 Secondary hyperparathyroidism of renal origin: Secondary | ICD-10-CM | POA: Diagnosis not present

## 2022-08-07 DIAGNOSIS — N186 End stage renal disease: Secondary | ICD-10-CM | POA: Diagnosis not present

## 2022-08-07 DIAGNOSIS — Z992 Dependence on renal dialysis: Secondary | ICD-10-CM | POA: Diagnosis not present

## 2022-08-10 DIAGNOSIS — N2581 Secondary hyperparathyroidism of renal origin: Secondary | ICD-10-CM | POA: Diagnosis not present

## 2022-08-10 DIAGNOSIS — N186 End stage renal disease: Secondary | ICD-10-CM | POA: Diagnosis not present

## 2022-08-10 DIAGNOSIS — Z992 Dependence on renal dialysis: Secondary | ICD-10-CM | POA: Diagnosis not present

## 2022-08-11 DIAGNOSIS — N186 End stage renal disease: Secondary | ICD-10-CM | POA: Diagnosis not present

## 2022-08-11 DIAGNOSIS — Z992 Dependence on renal dialysis: Secondary | ICD-10-CM | POA: Diagnosis not present

## 2022-08-12 DIAGNOSIS — Z992 Dependence on renal dialysis: Secondary | ICD-10-CM | POA: Diagnosis not present

## 2022-08-12 DIAGNOSIS — N186 End stage renal disease: Secondary | ICD-10-CM | POA: Diagnosis not present

## 2022-08-12 DIAGNOSIS — N2581 Secondary hyperparathyroidism of renal origin: Secondary | ICD-10-CM | POA: Diagnosis not present

## 2022-08-14 DIAGNOSIS — N2581 Secondary hyperparathyroidism of renal origin: Secondary | ICD-10-CM | POA: Diagnosis not present

## 2022-08-14 DIAGNOSIS — Z992 Dependence on renal dialysis: Secondary | ICD-10-CM | POA: Diagnosis not present

## 2022-08-14 DIAGNOSIS — N186 End stage renal disease: Secondary | ICD-10-CM | POA: Diagnosis not present

## 2022-08-17 DIAGNOSIS — N2581 Secondary hyperparathyroidism of renal origin: Secondary | ICD-10-CM | POA: Diagnosis not present

## 2022-08-17 DIAGNOSIS — N186 End stage renal disease: Secondary | ICD-10-CM | POA: Diagnosis not present

## 2022-08-17 DIAGNOSIS — Z992 Dependence on renal dialysis: Secondary | ICD-10-CM | POA: Diagnosis not present

## 2022-08-19 DIAGNOSIS — N186 End stage renal disease: Secondary | ICD-10-CM | POA: Diagnosis not present

## 2022-08-19 DIAGNOSIS — N2581 Secondary hyperparathyroidism of renal origin: Secondary | ICD-10-CM | POA: Diagnosis not present

## 2022-08-19 DIAGNOSIS — Z992 Dependence on renal dialysis: Secondary | ICD-10-CM | POA: Diagnosis not present

## 2022-08-21 DIAGNOSIS — N2581 Secondary hyperparathyroidism of renal origin: Secondary | ICD-10-CM | POA: Diagnosis not present

## 2022-08-21 DIAGNOSIS — N186 End stage renal disease: Secondary | ICD-10-CM | POA: Diagnosis not present

## 2022-08-21 DIAGNOSIS — Z992 Dependence on renal dialysis: Secondary | ICD-10-CM | POA: Diagnosis not present

## 2022-08-24 DIAGNOSIS — N2581 Secondary hyperparathyroidism of renal origin: Secondary | ICD-10-CM | POA: Diagnosis not present

## 2022-08-24 DIAGNOSIS — N186 End stage renal disease: Secondary | ICD-10-CM | POA: Diagnosis not present

## 2022-08-24 DIAGNOSIS — Z992 Dependence on renal dialysis: Secondary | ICD-10-CM | POA: Diagnosis not present

## 2022-08-25 ENCOUNTER — Encounter: Payer: Self-pay | Admitting: Orthopaedic Surgery

## 2022-08-25 ENCOUNTER — Ambulatory Visit (INDEPENDENT_AMBULATORY_CARE_PROVIDER_SITE_OTHER): Payer: Medicare HMO | Admitting: Orthopaedic Surgery

## 2022-08-25 VITALS — BP 158/94 | HR 61 | Ht 71.0 in

## 2022-08-25 DIAGNOSIS — G8929 Other chronic pain: Secondary | ICD-10-CM | POA: Diagnosis not present

## 2022-08-25 DIAGNOSIS — M25562 Pain in left knee: Secondary | ICD-10-CM | POA: Diagnosis not present

## 2022-08-25 NOTE — Progress Notes (Signed)
My knee hurts today.  He had a fracture of the left hip last time he was here.  He has had his hip surgery and is on a cane now.  He is doing better but the left knee is hurting.  He has no giving way.  He has swelling.  Left knee has slight effusion, crepitus, ROM 0 to 105, stable, uses a cane, has limp on left.  NV intact.  Encounter Diagnosis  Name Primary?   Chronic pain of left knee Yes   PROCEDURE NOTE:  The patient requests injections of the left knee , verbal consent was obtained.  The left knee was prepped appropriately after time out was performed.   Sterile technique was observed and injection of 1 cc of DepoMedrol 40 mg with several cc's of plain xylocaine. Anesthesia was provided by ethyl chloride and a 20-gauge needle was used to inject the knee area. The injection was tolerated well.  A band aid dressing was applied.  The patient was advised to apply ice later today and tomorrow to the injection sight as needed.  Return in one month.  Call if any problem.  Precautions discussed.  Electronically Signed [name], MD 5/14/20249:10 AM

## 2022-08-26 DIAGNOSIS — N186 End stage renal disease: Secondary | ICD-10-CM | POA: Diagnosis not present

## 2022-08-26 DIAGNOSIS — Z992 Dependence on renal dialysis: Secondary | ICD-10-CM | POA: Diagnosis not present

## 2022-08-26 DIAGNOSIS — N2581 Secondary hyperparathyroidism of renal origin: Secondary | ICD-10-CM | POA: Diagnosis not present

## 2022-08-27 ENCOUNTER — Emergency Department (HOSPITAL_BASED_OUTPATIENT_CLINIC_OR_DEPARTMENT_OTHER): Payer: Medicare HMO

## 2022-08-27 ENCOUNTER — Inpatient Hospital Stay (HOSPITAL_COMMUNITY)
Admission: EM | Admit: 2022-08-27 | Discharge: 2022-08-29 | DRG: 321 | Disposition: A | Discharge status: Home / Self Care | Payer: Medicare HMO | Attending: Internal Medicine | Admitting: Internal Medicine

## 2022-08-27 ENCOUNTER — Encounter (HOSPITAL_COMMUNITY): Payer: Self-pay | Admitting: Emergency Medicine

## 2022-08-27 ENCOUNTER — Emergency Department (HOSPITAL_COMMUNITY): Payer: Medicare HMO

## 2022-08-27 DIAGNOSIS — I161 Hypertensive emergency: Secondary | ICD-10-CM | POA: Diagnosis present

## 2022-08-27 DIAGNOSIS — E1122 Type 2 diabetes mellitus with diabetic chronic kidney disease: Secondary | ICD-10-CM | POA: Diagnosis not present

## 2022-08-27 DIAGNOSIS — I440 Atrioventricular block, first degree: Secondary | ICD-10-CM | POA: Diagnosis present

## 2022-08-27 DIAGNOSIS — Z794 Long term (current) use of insulin: Secondary | ICD-10-CM | POA: Diagnosis not present

## 2022-08-27 DIAGNOSIS — K5900 Constipation, unspecified: Secondary | ICD-10-CM | POA: Diagnosis present

## 2022-08-27 DIAGNOSIS — R918 Other nonspecific abnormal finding of lung field: Secondary | ICD-10-CM | POA: Diagnosis not present

## 2022-08-27 DIAGNOSIS — I132 Hypertensive heart and chronic kidney disease with heart failure and with stage 5 chronic kidney disease, or end stage renal disease: Secondary | ICD-10-CM | POA: Diagnosis not present

## 2022-08-27 DIAGNOSIS — Z96643 Presence of artificial hip joint, bilateral: Secondary | ICD-10-CM | POA: Diagnosis present

## 2022-08-27 DIAGNOSIS — E785 Hyperlipidemia, unspecified: Secondary | ICD-10-CM | POA: Diagnosis not present

## 2022-08-27 DIAGNOSIS — Z801 Family history of malignant neoplasm of trachea, bronchus and lung: Secondary | ICD-10-CM

## 2022-08-27 DIAGNOSIS — Z8249 Family history of ischemic heart disease and other diseases of the circulatory system: Secondary | ICD-10-CM

## 2022-08-27 DIAGNOSIS — I509 Heart failure, unspecified: Secondary | ICD-10-CM

## 2022-08-27 DIAGNOSIS — I249 Acute ischemic heart disease, unspecified: Secondary | ICD-10-CM | POA: Diagnosis present

## 2022-08-27 DIAGNOSIS — I2510 Atherosclerotic heart disease of native coronary artery without angina pectoris: Secondary | ICD-10-CM | POA: Diagnosis not present

## 2022-08-27 DIAGNOSIS — D509 Iron deficiency anemia, unspecified: Secondary | ICD-10-CM | POA: Diagnosis present

## 2022-08-27 DIAGNOSIS — I120 Hypertensive chronic kidney disease with stage 5 chronic kidney disease or end stage renal disease: Secondary | ICD-10-CM | POA: Diagnosis not present

## 2022-08-27 DIAGNOSIS — N186 End stage renal disease: Secondary | ICD-10-CM | POA: Diagnosis not present

## 2022-08-27 DIAGNOSIS — N250 Renal osteodystrophy: Secondary | ICD-10-CM | POA: Diagnosis present

## 2022-08-27 DIAGNOSIS — Z800 Family history of malignant neoplasm of digestive organs: Secondary | ICD-10-CM | POA: Diagnosis not present

## 2022-08-27 DIAGNOSIS — D500 Iron deficiency anemia secondary to blood loss (chronic): Secondary | ICD-10-CM | POA: Diagnosis not present

## 2022-08-27 DIAGNOSIS — I214 Non-ST elevation (NSTEMI) myocardial infarction: Secondary | ICD-10-CM | POA: Diagnosis not present

## 2022-08-27 DIAGNOSIS — R0789 Other chest pain: Secondary | ICD-10-CM | POA: Diagnosis present

## 2022-08-27 DIAGNOSIS — Z992 Dependence on renal dialysis: Secondary | ICD-10-CM

## 2022-08-27 DIAGNOSIS — D631 Anemia in chronic kidney disease: Secondary | ICD-10-CM | POA: Diagnosis present

## 2022-08-27 DIAGNOSIS — Z833 Family history of diabetes mellitus: Secondary | ICD-10-CM

## 2022-08-27 DIAGNOSIS — I5031 Acute diastolic (congestive) heart failure: Secondary | ICD-10-CM | POA: Diagnosis not present

## 2022-08-27 DIAGNOSIS — E8770 Fluid overload, unspecified: Secondary | ICD-10-CM | POA: Diagnosis present

## 2022-08-27 DIAGNOSIS — Z955 Presence of coronary angioplasty implant and graft: Principal | ICD-10-CM

## 2022-08-27 DIAGNOSIS — Z79899 Other long term (current) drug therapy: Secondary | ICD-10-CM | POA: Diagnosis not present

## 2022-08-27 DIAGNOSIS — D649 Anemia, unspecified: Secondary | ICD-10-CM | POA: Diagnosis present

## 2022-08-27 DIAGNOSIS — I252 Old myocardial infarction: Secondary | ICD-10-CM | POA: Diagnosis not present

## 2022-08-27 DIAGNOSIS — I1311 Hypertensive heart and chronic kidney disease without heart failure, with stage 5 chronic kidney disease, or end stage renal disease: Secondary | ICD-10-CM | POA: Diagnosis present

## 2022-08-27 DIAGNOSIS — K219 Gastro-esophageal reflux disease without esophagitis: Secondary | ICD-10-CM | POA: Diagnosis present

## 2022-08-27 DIAGNOSIS — Z87442 Personal history of urinary calculi: Secondary | ICD-10-CM

## 2022-08-27 DIAGNOSIS — R079 Chest pain, unspecified: Secondary | ICD-10-CM

## 2022-08-27 LAB — CBC WITH DIFFERENTIAL/PLATELET
Abs Immature Granulocytes: 0.06 10*3/uL (ref 0.00–0.07)
Basophils Absolute: 0.1 10*3/uL (ref 0.0–0.1)
Basophils Relative: 1 %
Eosinophils Absolute: 0.1 10*3/uL (ref 0.0–0.5)
Eosinophils Relative: 1 %
HCT: 30.3 % — ABNORMAL LOW (ref 39.0–52.0)
Hemoglobin: 9.3 g/dL — ABNORMAL LOW (ref 13.0–17.0)
Immature Granulocytes: 1 %
Lymphocytes Relative: 11 %
Lymphs Abs: 1.1 10*3/uL (ref 0.7–4.0)
MCH: 30.9 pg (ref 26.0–34.0)
MCHC: 30.7 g/dL (ref 30.0–36.0)
MCV: 100.7 fL — ABNORMAL HIGH (ref 80.0–100.0)
Monocytes Absolute: 0.8 10*3/uL (ref 0.1–1.0)
Monocytes Relative: 8 %
Neutro Abs: 8.1 10*3/uL — ABNORMAL HIGH (ref 1.7–7.7)
Neutrophils Relative %: 78 %
Platelets: 236 10*3/uL (ref 150–400)
RBC: 3.01 MIL/uL — ABNORMAL LOW (ref 4.22–5.81)
RDW: 15.2 % (ref 11.5–15.5)
WBC: 10.2 10*3/uL (ref 4.0–10.5)
nRBC: 0 % (ref 0.0–0.2)

## 2022-08-27 LAB — ECHOCARDIOGRAM COMPLETE
Area-P 1/2: 4.21 cm2
Est EF: >75
MV VTI: 2.17 cm2
S' Lateral: 2.1 cm

## 2022-08-27 LAB — COMPREHENSIVE METABOLIC PANEL
ALT: 15 U/L (ref 0–44)
AST: 23 U/L (ref 15–41)
Albumin: 3.7 g/dL (ref 3.5–5.0)
Alkaline Phosphatase: 50 U/L (ref 38–126)
Anion gap: 10 (ref 5–15)
BUN: 21 mg/dL (ref 8–23)
CO2: 25 mmol/L (ref 22–32)
Calcium: 9 mg/dL (ref 8.9–10.3)
Chloride: 100 mmol/L (ref 98–111)
Creatinine, Ser: 4.89 mg/dL — ABNORMAL HIGH (ref 0.61–1.24)
GFR, Estimated: 11 mL/min — ABNORMAL LOW (ref >60)
Glucose, Bld: 142 mg/dL — ABNORMAL HIGH (ref 70–99)
Potassium: 4.3 mmol/L (ref 3.5–5.1)
Sodium: 135 mmol/L (ref 135–145)
Total Bilirubin: 0.8 mg/dL (ref 0.3–1.2)
Total Protein: 7.5 g/dL (ref 6.5–8.1)

## 2022-08-27 LAB — GLUCOSE, CAPILLARY
Glucose-Capillary: 129 mg/dL — ABNORMAL HIGH (ref 70–99)
Glucose-Capillary: 117 mg/dL — ABNORMAL HIGH (ref 70–99)

## 2022-08-27 LAB — BRAIN NATRIURETIC PEPTIDE: B Natriuretic Peptide: 867 pg/mL — ABNORMAL HIGH (ref 0.0–100.0)

## 2022-08-27 LAB — HEPARIN LEVEL (UNFRACTIONATED): Heparin Unfractionated: 0.34 IU/mL (ref 0.30–0.70)

## 2022-08-27 LAB — TROPONIN I (HIGH SENSITIVITY)
Troponin I (High Sensitivity): 82 ng/L — ABNORMAL HIGH (ref <18)
Troponin I (High Sensitivity): 216 ng/L (ref <18)
Troponin I (High Sensitivity): 722 ng/L (ref <18)

## 2022-08-27 LAB — LIPASE, BLOOD: Lipase: 44 U/L (ref 11–51)

## 2022-08-27 MED ORDER — SODIUM CHLORIDE 0.9% FLUSH
3.0000 mL | Freq: Two times a day (BID) | INTRAVENOUS | Status: DC
  Administered 2022-08-29: 3 mL via INTRAVENOUS

## 2022-08-27 MED ORDER — AMLODIPINE BESYLATE 5 MG PO TABS
5.0000 mg | ORAL_TABLET | Freq: Every day | ORAL | Status: DC
  Administered 2022-08-27 – 2022-08-29 (×3): 5 mg via ORAL
  Filled 2022-08-27 (×4): qty 1

## 2022-08-27 MED ORDER — POLYETHYLENE GLYCOL 3350 17 G PO PACK: 17.0000 g | PACK | Freq: Every day | ORAL | Status: DC | PRN

## 2022-08-27 MED ORDER — TRAZODONE HCL 50 MG PO TABS: 50.0000 mg | ORAL_TABLET | Freq: Every evening | ORAL | Status: DC | PRN

## 2022-08-27 MED ORDER — INSULIN ASPART 100 UNIT/ML IJ SOLN: 0.0000 [IU] | Freq: Every day | INTRAMUSCULAR | Status: DC

## 2022-08-27 MED ORDER — SODIUM CHLORIDE 0.9% FLUSH: 3.0000 mL | INTRAVENOUS | Status: DC | PRN

## 2022-08-27 MED ORDER — HEPARIN BOLUS VIA INFUSION
4000.0000 [IU] | Freq: Once | INTRAVENOUS | Status: AC
  Administered 2022-08-27: 4000 [IU] via INTRAVENOUS

## 2022-08-27 MED ORDER — ROSUVASTATIN CALCIUM 5 MG PO TABS
10.0000 mg | ORAL_TABLET | Freq: Every evening | ORAL | Status: DC
  Administered 2022-08-27 – 2022-08-28 (×2): 10 mg via ORAL
  Filled 2022-08-27 (×2): qty 2

## 2022-08-27 MED ORDER — ACETAMINOPHEN 650 MG RE SUPP: 650.0000 mg | Freq: Four times a day (QID) | RECTAL | Status: DC | PRN

## 2022-08-27 MED ORDER — LACTULOSE 10 GM/15ML PO SOLN
60.0000 g | Freq: Once | ORAL | Status: AC
  Administered 2022-08-27: 60 g via ORAL
  Filled 2022-08-27: qty 90

## 2022-08-27 MED ORDER — NITROGLYCERIN IN D5W 200-5 MCG/ML-% IV SOLN
5.0000 ug/min | INTRAVENOUS | Status: DC
  Administered 2022-08-27: 5 ug/min via INTRAVENOUS
  Filled 2022-08-27: qty 250

## 2022-08-27 MED ORDER — CHLORHEXIDINE GLUCONATE CLOTH 2 % EX PADS
6.0000 | MEDICATED_PAD | Freq: Every day | CUTANEOUS | Status: DC
  Administered 2022-08-28: 6 via TOPICAL

## 2022-08-27 MED ORDER — CALCITRIOL 0.5 MCG PO CAPS
1.2500 ug | ORAL_CAPSULE | ORAL | Status: DC
  Administered 2022-08-28: 1.25 ug via ORAL
  Filled 2022-08-27: qty 1

## 2022-08-27 MED ORDER — CARVEDILOL 12.5 MG PO TABS
12.5000 mg | ORAL_TABLET | Freq: Once | ORAL | Status: AC
  Administered 2022-08-27: 12.5 mg via ORAL
  Filled 2022-08-27: qty 1

## 2022-08-27 MED ORDER — INSULIN ASPART 100 UNIT/ML IJ SOLN
3.0000 [IU] | Freq: Three times a day (TID) | INTRAMUSCULAR | Status: DC
  Administered 2022-08-27 – 2022-08-29 (×3): 3 [IU] via SUBCUTANEOUS

## 2022-08-27 MED ORDER — SODIUM CHLORIDE 0.9 % IV SOLN: 250.0000 mL | INTRAVENOUS | Status: DC | PRN

## 2022-08-27 MED ORDER — HYDRALAZINE HCL 20 MG/ML IJ SOLN: 10.0000 mg | INTRAMUSCULAR | Status: DC | PRN

## 2022-08-27 MED ORDER — PERFLUTREN LIPID MICROSPHERE
1.0000 mL | INTRAVENOUS | Status: AC | PRN
  Administered 2022-08-27: 3 mL via INTRAVENOUS

## 2022-08-27 MED ORDER — SODIUM CHLORIDE 0.9 % IV BOLUS: 500.0000 mL | Freq: Once | INTRAVENOUS | Status: DC

## 2022-08-27 MED ORDER — SODIUM CHLORIDE 0.9 % IV SOLN: INTRAVENOUS | Status: DC

## 2022-08-27 MED ORDER — ASPIRIN 81 MG PO CHEW
81.0000 mg | CHEWABLE_TABLET | ORAL | Status: AC
  Administered 2022-08-28: 81 mg via ORAL
  Filled 2022-08-27: qty 1

## 2022-08-27 MED ORDER — HEPARIN (PORCINE) 25000 UT/250ML-% IV SOLN
1350.0000 [IU]/h | INTRAVENOUS | Status: DC
  Administered 2022-08-27: 1200 [IU]/h via INTRAVENOUS
  Administered 2022-08-28: 1350 [IU]/h via INTRAVENOUS
  Filled 2022-08-27 (×2): qty 250

## 2022-08-27 MED ORDER — BISACODYL 10 MG RE SUPP: 10.0000 mg | Freq: Every day | RECTAL | Status: DC | PRN

## 2022-08-27 MED ORDER — SENNOSIDES-DOCUSATE SODIUM 8.6-50 MG PO TABS
2.0000 | ORAL_TABLET | Freq: Two times a day (BID) | ORAL | Status: DC
  Administered 2022-08-27: 2 via ORAL
  Filled 2022-08-27 (×2): qty 2

## 2022-08-27 MED ORDER — NITROGLYCERIN IN D5W 200-5 MCG/ML-% IV SOLN
0.0000 ug/min | INTRAVENOUS | Status: DC
  Administered 2022-08-27: 5 ug/min via INTRAVENOUS
  Filled 2022-08-27: qty 250

## 2022-08-27 MED ORDER — SODIUM CHLORIDE 0.9% FLUSH: 3.0000 mL | Freq: Two times a day (BID) | INTRAVENOUS | Status: DC

## 2022-08-27 MED ORDER — INSULIN ASPART 100 UNIT/ML IJ SOLN
0.0000 [IU] | Freq: Three times a day (TID) | INTRAMUSCULAR | Status: DC
  Administered 2022-08-27 – 2022-08-29 (×4): 3 [IU] via SUBCUTANEOUS

## 2022-08-27 MED ORDER — INSULIN GLARGINE-YFGN 100 UNIT/ML ~~LOC~~ SOLN
10.0000 [IU] | Freq: Every day | SUBCUTANEOUS | Status: DC
  Administered 2022-08-28 – 2022-08-29 (×2): 10 [IU] via SUBCUTANEOUS
  Filled 2022-08-27 (×3): qty 0.1

## 2022-08-27 MED ORDER — ONDANSETRON HCL 4 MG/2ML IJ SOLN
4.0000 mg | Freq: Four times a day (QID) | INTRAMUSCULAR | Status: DC | PRN
  Administered 2022-08-28: 4 mg via INTRAVENOUS
  Filled 2022-08-27: qty 2

## 2022-08-27 MED ORDER — ACETAMINOPHEN 325 MG PO TABS: 650.0000 mg | ORAL_TABLET | Freq: Four times a day (QID) | ORAL | Status: DC | PRN

## 2022-08-27 MED ORDER — HYDRALAZINE HCL 25 MG PO TABS
100.0000 mg | ORAL_TABLET | Freq: Three times a day (TID) | ORAL | Status: DC
  Administered 2022-08-27 – 2022-08-29 (×5): 100 mg via ORAL
  Filled 2022-08-27 (×6): qty 4

## 2022-08-27 MED ORDER — CARVEDILOL 12.5 MG PO TABS
12.5000 mg | ORAL_TABLET | Freq: Two times a day (BID) | ORAL | Status: DC
  Administered 2022-08-27 – 2022-08-29 (×3): 12.5 mg via ORAL
  Filled 2022-08-27 (×4): qty 1

## 2022-08-27 MED ORDER — HYDRALAZINE HCL 25 MG PO TABS
100.0000 mg | ORAL_TABLET | Freq: Once | ORAL | Status: AC
  Administered 2022-08-27: 50 mg via ORAL
  Filled 2022-08-27: qty 4

## 2022-08-27 MED ORDER — SODIUM CHLORIDE 0.9 % IV SOLN: INTRAVENOUS | Status: DC | PRN

## 2022-08-27 MED ORDER — ONDANSETRON HCL 4 MG PO TABS: 4.0000 mg | ORAL_TABLET | Freq: Four times a day (QID) | ORAL | Status: DC | PRN

## 2022-08-27 MED ORDER — NITROGLYCERIN 0.4 MG SL SUBL
0.4000 mg | SUBLINGUAL_TABLET | Freq: Once | SUBLINGUAL | Status: AC
  Administered 2022-08-27: 0.4 mg via SUBLINGUAL
  Filled 2022-08-27: qty 1

## 2022-08-27 MED ORDER — POLYETHYLENE GLYCOL 3350 17 G PO PACK
17.0000 g | PACK | Freq: Every day | ORAL | Status: DC
  Filled 2022-08-27: qty 1

## 2022-08-27 MED ORDER — ASPIRIN 81 MG PO TBEC
81.0000 mg | DELAYED_RELEASE_TABLET | Freq: Every day | ORAL | Status: DC
  Administered 2022-08-28 – 2022-08-29 (×2): 81 mg via ORAL
  Filled 2022-08-27 (×3): qty 1

## 2022-08-27 MED ORDER — ASPIRIN 325 MG PO TBEC
325.0000 mg | DELAYED_RELEASE_TABLET | Freq: Once | ORAL | Status: AC
  Administered 2022-08-27: 325 mg via ORAL
  Filled 2022-08-27: qty 1

## 2022-08-27 NOTE — Interval H&P Note (Signed)
History and Physical Interval Note:  08/27/2022 9:53 PM  Trevor [name]  has presented today for surgery, with the diagnosis of chest pain.  The various methods of treatment have been discussed with the patient and family. After consideration of risks, benefits and other options for treatment, the patient has consented to  Procedure(s): LEFT HEART CATH AND CORONARY ANGIOGRAPHY (N/A) as a surgical intervention.  The patient's history has been reviewed, patient examined, no change in status, stable for surgery.  I have reviewed the patient's chart and labs.  Questions were answered to the patient's satisfaction.     Orbie Pyo

## 2022-08-27 NOTE — ED Notes (Signed)
Attempted to obtain IV access, unable to obtain IV access. Pt states they often have to look with an ultrasound, will ask RN certified in ultrasound IV to attempt IV access

## 2022-08-27 NOTE — Consult Note (Addendum)
 Cardiology Consultation   Patient ID: Trevor [name] MRN: [id number]; DOB: 01/19/1941  Admit date: 08/27/2022 Date of Consult: 08/27/2022  PCP:  Hall, John Z, MD   [facility] Providers Cardiologist: New to [facility]  Patient Profile:   Trevor [name] is a 82 y.o. male with a hx of HTN, HLD, Type 2 DM, anemia and ESRD (on HD - MWF schedule) who is being seen 08/27/2022 for the evaluation of chest pain at the request of Dr. Zammit.  History of Present Illness:   Trevor [name] was recently admitted to [facility] from 4/9 - 07/28/2022 after suffering a left hip fracture and undergoing left total hip arthroplasty on 07/23/2022. He was discharged on ASA 81 mg twice daily for 30 days and was discharged to SNF for rehab.   He presented to [facility] ED this morning for evaluation of chest pain starting yesterday. In talking the patient today, he reports activity has been more limited since undergoing hip surgery but he is now overall recovering well from this and is at home. Ambulating with a cane. Starting yesterday, he reports developing discomfort along his epigastric/RUQ region and feels like this was due to not having a bowel movement for at least 3 days. He received Lactulose while at dialysis but says he still did not have a bowel movement. Reports his discomfort would radiate into his chest at times but this is not associated with positional changes or exertion. Reports that his pain would improve with belching or flatulence.  Reports some intermittent nausea as well. No specific dyspnea on exertion, orthopnea, PND or pitting edema. He has been at his dry weight during dialysis and feels like they are removing too much fluid.  He is unaware of any personal history of CAD, CHF or cardiac arrhythmias.    Upon arrival to the ED, BP has since significantly elevated with SBP in the 200's. Initial labs showed WBC 10.2, Hgb 9.3, platelets 236, Na+ 135, K+ 4.3 and creatinine 4.89.  AST 23 and  ALT 15.  Lipase normal at 44. Initial Hs Troponin elevated to 82. Plain film of abdomen showed prominent bilateral interstitial opacities which may reflect pulmonary venous congestion or atypical infection. Also noted to have nonobstructive bowel gas pattern and large colonic stool burden. EKG shows normal sinus rhythm, heart rate 75 with first-degree AV block and baseline artifact.   Past Medical History:  Diagnosis Date   Anemia    Chronic kidney disease    Stage 4?    Diabetes mellitus without complication (HCC)    Hepatitis    not sure what type - over 20 years ago   History of kidney stones    Hyperlipidemia    Hypertension     Past Surgical History:  Procedure Laterality Date   AV FISTULA PLACEMENT Left 08/09/2019   Procedure: LEFT ARM Basilic  ARTERIOVENOUS  FISTULA CREATION;  Surgeon: Cain, Brandon Christopher, MD;  Location: [facility] OR;  Service: Vascular;  Laterality: Left;   AV FISTULA PLACEMENT Left 12/17/2020   Procedure: INSERTION OF ARTERIOVENOUS (AV) GORE-TEX GRAFT LEFT ARM;  Surgeon: Cain, Brandon Christopher, MD;  Location: [facility] OR;  Service: Vascular;  Laterality: Left;   BASCILIC VEIN TRANSPOSITION Left 11/30/2019   Procedure: LEFT SECOND STAGE BASCILIC VEIN TRANSPOSITION;  Surgeon: Cain, Brandon Christopher, MD;  Location: [facility] OR;  Service: Vascular;  Laterality: Left;   COLONOSCOPY N/A 07/07/2017   Procedure: COLONOSCOPY;  Surgeon: Fields, Sandi L, MD;  [facility]: [facility];    Service: Endoscopy;  Laterality: N/A;  8:30   IR FLUORO GUIDE CV LINE RIGHT  09/06/2020   IR US GUIDE VASC ACCESS RIGHT  09/06/2020   TOTAL HIP ARTHROPLASTY Right 10/09/2020   Procedure: TOTAL HIP ARTHROPLASTY ANTERIOR APPROACH;  Surgeon: Swinteck, Brian, MD;  Location: [facility] OR;  Service: Orthopedics;  Laterality: Right;   TOTAL HIP ARTHROPLASTY Left 07/23/2022   Procedure: TOTAL HIP ARTHROPLASTY ANTERIOR APPROACH;  Surgeon: Swinteck, Brian, MD;  Location: [facility] OR;  Service: Orthopedics;  Laterality: Left;      Home Medications:  Prior to Admission medications   Medication Sig Start Date End Date Taking? Authorizing Provider  ACCU-CHEK AVIVA PLUS test strip USE TO TEST BLOOD SUGAR BEFORE BREAKFAST AND AT BEDTIME 05/26/21   Nida, Gebreselassie W, MD  Accu-Chek Softclix Lancets lancets  09/13/19   [provider]  acetaminophen (TYLENOL) 325 MG tablet Take 1-2 tablets (325-650 mg total) by mouth every 6 (six) hours as needed for mild pain (pain score 1-3 or temp > 100.5). 07/28/22   Lama, Gagan S, MD  Alcohol Swabs (B-D SINGLE USE SWABS REGULAR) PADS  08/11/19   [provider]  Blood Glucose Monitoring Suppl (ONETOUCH VERIO) w/Device KIT 1 each by Does not apply route as needed. 05/03/20   Nida, Gebreselassie W, MD  carvedilol (COREG) 12.5 MG tablet Take 12.5 mg by mouth 2 (two) times daily with a meal. 10/18/19   [provider]  docusate sodium (COLACE) 100 MG capsule Take 1 capsule (100 mg total) by mouth 2 (two) times daily. 07/28/22   Lama, Gagan S, MD  hydrALAZINE (APRESOLINE) 100 MG tablet Take 50 mg by mouth 2 (two) times daily. 07/03/20   [provider]  ondansetron (ZOFRAN) 4 MG tablet Take 1 tablet (4 mg total) by mouth every 6 (six) hours as needed for nausea. 07/28/22   Lama, Gagan S, MD  oxyCODONE (OXY IR/ROXICODONE) 5 MG immediate release tablet Take 1-2 tablets (5-10 mg total) by mouth every 4 (four) hours as needed for moderate pain (pain score 4-6). 07/28/22   Lama, Gagan S, MD  polyethylene glycol (MIRALAX / GLYCOLAX) 17 g packet Take 17 g by mouth daily as needed for mild constipation. 07/28/22   Lama, Gagan S, MD  rosuvastatin (CRESTOR) 10 MG tablet Take 10 mg by mouth every evening. 01/26/17   [provider]  senna (SENOKOT) 8.6 MG TABS tablet Take 1 tablet (8.6 mg total) by mouth 2 (two) times daily. 07/28/22   Lama, Gagan S, MD    Inpatient Medications: Scheduled Meds:  carvedilol  12.5 mg Oral Once   hydrALAZINE  100 mg Oral Once    Continuous Infusions:  PRN Meds:   Allergies:   No Known Allergies  Social History:   Social History   Socioeconomic History   Marital status: Married    Spouse name: Not on file   Number of children: Not on file   Years of education: Not on file   Highest education level: Not on file  Occupational History   Not on file  Tobacco Use   Smoking status: Never   Smokeless tobacco: Never  Vaping Use   Vaping Use: Never used  Substance and Sexual Activity   Alcohol use: Never   Drug use: Never   Sexual activity: Not on file  Other Topics Concern   Not on file  Social History Narrative   Not on file   Social Determinants of Health   Financial Resource Strain: Not on   file  Food Insecurity: Not on file  Transportation Needs: Not on file  Physical Activity: Not on file  Stress: Not on file  Social Connections: Not on file  Intimate Partner Violence: Not on file    Family History:    Family History  Problem Relation Age of Onset   Diabetes Mother    Diabetes Brother    Heart attack Brother    Colon cancer Son    Lung cancer Son      ROS:  Please see the history of present illness.   All other ROS reviewed and negative.     Physical Exam/Data:   Vitals:   08/27/22 0705 08/27/22 0716 08/27/22 0730 08/27/22 0900  BP: (!) 216/86  (!) 214/75 (!) 134/96  Pulse: 74  66 70  Resp: 18  14 15  Temp:   98.1 F (36.7 C)   TempSrc:   Oral   SpO2: 99% 99% 98% 96%   No intake or output data in the 24 hours ending 08/27/22 1000    07/27/2022   11:44 AM 07/27/2022    7:55 AM 07/22/2022   12:18 AM  Last 3 Weights  Weight (lbs) 202 lb 6.1 oz 206 lb 12.7 oz 215 lb 9.8 oz  Weight (kg) 91.8 kg 93.8 kg 97.8 kg     There is no height or weight on file to calculate BMI.  General:  Pleasant male appearing in no acute distress HEENT: normal Neck: no JVD Vascular: No carotid bruits; Distal pulses 2+ bilaterally Cardiac:  normal S1, S2; RRR; no murmur  Lungs:  clear to  auscultation bilaterally, no wheezing, rhonchi or rales  Abd: appears distended.  Ext: no pitting edema Musculoskeletal:  No deformities, BUE and BLE strength normal and equal Skin: warm and dry  Neuro:  CNs 2-12 intact, no focal abnormalities noted Psych:  Normal affect   EKG:  The EKG was personally reviewed and demonstrates: NSR, HR 75 with first-degree AV block and baseline artifact. Telemetry:  Telemetry was personally reviewed and demonstrates:  NSR, HR in 60's to 70's.   Relevant CV Studies:  Echocardiogram: Pending  Laboratory Data:  High Sensitivity Troponin:   Recent Labs  Lab 08/27/22 0851  TROPONINIHS 82*     Chemistry Recent Labs  Lab 08/27/22 0851  NA 135  K 4.3  CL 100  CO2 25  GLUCOSE 142*  BUN 21  CREATININE 4.89*  CALCIUM 9.0  GFRNONAA 11*  ANIONGAP 10    Recent Labs  Lab 08/27/22 0851  PROT 7.5  ALBUMIN 3.7  AST 23  ALT 15  ALKPHOS 50  BILITOT 0.8   Lipids No results for input(s): "CHOL", "TRIG", "HDL", "LABVLDL", "LDLCALC", "CHOLHDL" in the last 168 hours.  Hematology Recent Labs  Lab 08/27/22 0851  WBC 10.2  RBC 3.01*  HGB 9.3*  HCT 30.3*  MCV 100.7*  MCH 30.9  MCHC 30.7  RDW 15.2  PLT 236   Thyroid No results for input(s): "TSH", "FREET4" in the last 168 hours.  BNPNo results for input(s): "BNP", "PROBNP" in the last 168 hours.  DDimer No results for input(s): "DDIMER" in the last 168 hours.   Radiology/Studies:  DG ABD ACUTE 2+V W 1V CHEST  Result Date: 08/27/2022 CLINICAL DATA:  Pain EXAM: DG ABDOMEN ACUTE WITH 1 VIEW CHEST COMPARISON:  CXR 10/23/20 FINDINGS: No pleural effusion. No pneumothorax. Normal cardiac and mediastinal contours. There are prominent bilateral interstitial opacities could represent pulmonary venous congestion or atypical infection. Nonobstructive bowel   gas pattern. Large colonic stool burden. No pneumatosis. No supine evidence of pneumoperitoneum. Bilateral hip arthroplasties. Degenerative changes in  the lower lumbar spine. IMPRESSION: 1. Prominent bilateral interstitial opacities could represent pulmonary venous congestion or atypical infection. 2. Nonobstructive bowel gas pattern. Large colonic stool burden. Electronically Signed   By: Hemant  Desai M.D.   On: 08/27/2022 07:53     Assessment and Plan:   1. Atypical Chest Pain/Elevated Hs Troponin - His chest pain is overall atypical for a cardiac etiology as this is mostly along his epigastric/right upper quadrant region and not along his chest and he reports this has been present for at least 24 hours and improves with belching and flatulence. No association with exertion. - Initial Hs Troponin was elevated at 82 with repeat values pending. If enzymes overall remain flat, this is likely secondary to demand ischemia in the setting of hypertensive urgency given his SBP in the 200's. If this trends significantly upwards, would need to consider starting Heparin and pursue further cardiac workup. While he did just have surgery last month, his story seems atypical for a PE and oxygen saturations are in the high-90's and he is not tachycardic.  - At this time, will obtain an echocardiogram to assess for any structural abnormalities. If enzymes remain flat and this is reassuring, would not anticipate further cardiac testing this admission.  2. Hypertensive Urgency - BP was at 216/78 on most recent check. He is on Coreg 12.5 mg twice daily and Hydralazine 50 mg twice daily at home. He just received PO Coreg and Hydralazine. Would follow and titrate medical therapy as needed.   3. HLD - Followed by his PCP. He remains on Crestor 10mg daily.   4. ESRD - On HD - MWF schedule. He reports still producing urine and has been at his dry weight.   5. Abdominal Pain - Plain film of abdomen shows nonobstructive bowel gas pattern and large colonic stool burden. Received Lactulose yesterday with no improvement per his report. Management per ED  provider/admitting team.    For questions or updates, please contact [facility] Please consult [email or web address] for contact info under    Signed, Brittany M Strader, PA-C  08/27/2022 10:00 AM   Attending attestation  Patient seen and independently examined with Brittany Strader, PA-C. We discussed all aspects of the encounter. I agree with the assessment and plan as stated above.   Patient is a 82 y/o M known to have HTN, DM2, ESRD DD c/c abdominal pain radiating to chest since last night which partially resolved with Gatorade but came to the ER to get evaluated. Also after he belched last night, his chest pressure partially subsided. He had HD yesterday and thinks this could be from too much fluid removal. No DOE. He did not take BP meds this AM. Had ongoing substernal chest pressure since last night 7/10 in intensity that came down to 5/10 now. Keeps denying chest pain but has chest pressure.  Never had this chest pressure before. EKG showed no ischemia. High sensitivity troponins were 82>>216.  BNP is 800s. Echocardiogram showed normal LVEF and no RWMA. He is active and independent at home. Preacher by profession. Imaging evidence of stool burden in the colon. Physical examination is remarkable for patient not in acute respite distress, HEENT normal, S1-S2 heard, systolic murmur present, clear lungs, nontender abdomen and nonpitting edema in bilateral lower extremities.  AOx3.  # Hypertensive emergency # NSTEMI -Has ongoing substernal chest pressure since last night. EKG   showed no ischemia. High-sensitivity troponins 82>>216. Continue ACS protocol and start NTG drip. Current chest pressure level is 5/10. Echocardiogram showed normal LVEF and no RWMA. He will benefit from transfer to [facility] for [facility] due to NSTEMI and risk factors of HTN, DM 2, ESRD DD. - Risks and benefits of cardiac catheterization have been discussed with the patient. These include bleeding, infection, kidney  damage, stroke, heart attack, death. The patient understands these risks and is willing to proceed.  # Postop blood loss anemia -Patient had hip replacement 1 month ago (Hb dropped from 12 to 9).  No bleeding in the last 1 month.  Continue p.o. iron supplements.  I have spent a total of 75 minutes with patient reviewing chart , telemetry, EKGs, labs and examining patient as well as establishing an assessment and plan that was discussed with the patient.  > 50% of time was spent in direct patient care.     Jashae Wiggs Priya Salam [place], MD [place]  [facility]  1:52 PM 

## 2022-08-27 NOTE — H&P (View-Only) (Signed)
Cardiology Consultation   Patient ID: MARQUEZE PEA MRN: [id number]; DOB: 1941-04-12  Admit date: 08/27/2022 Date of Consult: 08/27/2022  PCP:  Benita Stabile, MD   [facility] Providers Cardiologist: New to [facility]  Patient Profile:   LORRAINE RATHGEB is a 82 y.o. male with a hx of HTN, HLD, Type 2 DM, anemia and ESRD (on HD - MWF schedule) who is being seen 08/27/2022 for the evaluation of chest pain at the request of Dr. Estell Harpin.  History of Present Illness:   Mr. Rensing was recently admitted to [facility] from 4/9 - 07/28/2022 after suffering a left hip fracture and undergoing left total hip arthroplasty on 07/23/2022. He was discharged on ASA 81 mg twice daily for 30 days and was discharged to SNF for rehab.   He presented to [facility] ED this morning for evaluation of chest pain starting yesterday. In talking the patient today, he reports activity has been more limited since undergoing hip surgery but he is now overall recovering well from this and is at home. Ambulating with a cane. Starting yesterday, he reports developing discomfort along his epigastric/RUQ region and feels like this was due to not having a bowel movement for at least 3 days. He received Lactulose while at dialysis but says he still did not have a bowel movement. Reports his discomfort would radiate into his chest at times but this is not associated with positional changes or exertion. Reports that his pain would improve with belching or flatulence.  Reports some intermittent nausea as well. No specific dyspnea on exertion, orthopnea, PND or pitting edema. He has been at his dry weight during dialysis and feels like they are removing too much fluid.  He is unaware of any personal history of CAD, CHF or cardiac arrhythmias.    Upon arrival to the ED, BP has since significantly elevated with SBP in the 200's. Initial labs showed WBC 10.2, Hgb 9.3, platelets 236, Na+ 135, K+ 4.3 and creatinine 4.89.  AST 23 and  ALT 15.  Lipase normal at 44. Initial Hs Troponin elevated to 82. Plain film of abdomen showed prominent bilateral interstitial opacities which may reflect pulmonary venous congestion or atypical infection. Also noted to have nonobstructive bowel gas pattern and large colonic stool burden. EKG shows normal sinus rhythm, heart rate 75 with first-degree AV block and baseline artifact.   Past Medical History:  Diagnosis Date   Anemia    Chronic kidney disease    Stage 4?    Diabetes mellitus without complication (HCC)    Hepatitis    not sure what type - over 20 years ago   History of kidney stones    Hyperlipidemia    Hypertension     Past Surgical History:  Procedure Laterality Date   AV FISTULA PLACEMENT Left 08/09/2019   Procedure: LEFT ARM Basilic  ARTERIOVENOUS  FISTULA CREATION;  Surgeon: Maeola Harman, MD;  Location: [facility] OR;  Service: Vascular;  Laterality: Left;   AV FISTULA PLACEMENT Left 12/17/2020   Procedure: INSERTION OF ARTERIOVENOUS (AV) GORE-TEX GRAFT LEFT ARM;  Surgeon: Maeola Harman, MD;  Location: [facility] OR;  Service: Vascular;  Laterality: Left;   BASCILIC VEIN TRANSPOSITION Left 11/30/2019   Procedure: LEFT SECOND STAGE BASCILIC VEIN TRANSPOSITION;  Surgeon: Maeola Harman, MD;  Location: [facility] OR;  Service: Vascular;  Laterality: Left;   COLONOSCOPY N/A 07/07/2017   Procedure: COLONOSCOPY;  Surgeon: West Bali, MD;  Location: [facility];  Service: Endoscopy;  Laterality: N/A;  8:30   IR FLUORO GUIDE CV LINE RIGHT  09/06/2020   IR US GUIDE VASC ACCESS RIGHT  09/06/2020   TOTAL HIP ARTHROPLASTY Right 10/09/2020   Procedure: TOTAL HIP ARTHROPLASTY ANTERIOR APPROACH;  Surgeon: [name], MD;  Location: [facility] OR;  Service: Orthopedics;  Laterality: Right;   TOTAL HIP ARTHROPLASTY Left 07/23/2022   Procedure: TOTAL HIP ARTHROPLASTY ANTERIOR APPROACH;  Surgeon: [name], MD;  Location: [facility] OR;  Service: Orthopedics;  Laterality: Left;      Home Medications:  Prior to Admission medications   Medication Sig Start Date End Date Taking? Authorizing Provider  ACCU-CHEK AVIVA PLUS test strip USE TO TEST BLOOD SUGAR BEFORE BREAKFAST AND AT BEDTIME 05/26/21   Roma Kayser, MD  Accu-Chek Softclix Lancets lancets  09/13/19   [provider]  acetaminophen (TYLENOL) 325 MG tablet Take 1-2 tablets (325-650 mg total) by mouth every 6 (six) hours as needed for mild pain (pain score 1-3 or temp > 100.5). 07/28/22   Meredeth Ide, MD  Alcohol Swabs (B-D SINGLE USE SWABS REGULAR) PADS  08/11/19   [provider]  Blood Glucose Monitoring Suppl (ONETOUCH VERIO) w/Device KIT 1 each by Does not apply route as needed. 05/03/20   Roma Kayser, MD  carvedilol (COREG) 12.5 MG tablet Take 12.5 mg by mouth 2 (two) times daily with a meal. 10/18/19   [provider]  docusate sodium (COLACE) 100 MG capsule Take 1 capsule (100 mg total) by mouth 2 (two) times daily. 07/28/22   Meredeth Ide, MD  hydrALAZINE (APRESOLINE) 100 MG tablet Take 50 mg by mouth 2 (two) times daily. 07/03/20   [provider]  ondansetron (ZOFRAN) 4 MG tablet Take 1 tablet (4 mg total) by mouth every 6 (six) hours as needed for nausea. 07/28/22   Meredeth Ide, MD  oxyCODONE (OXY IR/ROXICODONE) 5 MG immediate release tablet Take 1-2 tablets (5-10 mg total) by mouth every 4 (four) hours as needed for moderate pain (pain score 4-6). 07/28/22   Meredeth Ide, MD  polyethylene glycol (MIRALAX / GLYCOLAX) 17 g packet Take 17 g by mouth daily as needed for mild constipation. 07/28/22   Meredeth Ide, MD  rosuvastatin (CRESTOR) 10 MG tablet Take 10 mg by mouth every evening. 01/26/17   [provider]  senna (SENOKOT) 8.6 MG TABS tablet Take 1 tablet (8.6 mg total) by mouth 2 (two) times daily. 07/28/22   Meredeth Ide, MD    Inpatient Medications: Scheduled Meds:  carvedilol  12.5 mg Oral Once   hydrALAZINE  100 mg Oral Once    Continuous Infusions:  PRN Meds:   Allergies:   No Known Allergies  Social History:   Social History   Socioeconomic History   Marital status: Married    Spouse name: Not on file   Number of children: Not on file   Years of education: Not on file   Highest education level: Not on file  Occupational History   Not on file  Tobacco Use   Smoking status: Never   Smokeless tobacco: Never  Vaping Use   Vaping Use: Never used  Substance and Sexual Activity   Alcohol use: Never   Drug use: Never   Sexual activity: Not on file  Other Topics Concern   Not on file  Social History Narrative   Not on file   Social Determinants of Health   Financial Resource Strain: Not on  file  Food Insecurity: Not on file  Transportation Needs: Not on file  Physical Activity: Not on file  Stress: Not on file  Social Connections: Not on file  Intimate Partner Violence: Not on file    Family History:    Family History  Problem Relation Age of Onset   Diabetes Mother    Diabetes Brother    Heart attack Brother    Colon cancer Son    Lung cancer Son      ROS:  Please see the history of present illness.   All other ROS reviewed and negative.     Physical Exam/Data:   Vitals:   08/27/22 0705 08/27/22 0716 08/27/22 0730 08/27/22 0900  BP: (!) 216/86  (!) 214/75 (!) 134/96  Pulse: 74  66 70  Resp: 18  14 15   Temp:   98.1 F (36.7 C)   TempSrc:   Oral   SpO2: 99% 99% 98% 96%   No intake or output data in the 24 hours ending 08/27/22 1000    07/27/2022   11:44 AM 07/27/2022    7:55 AM 07/22/2022   12:18 AM  Last 3 Weights  Weight (lbs) 202 lb 6.1 oz 206 lb 12.7 oz 215 lb 9.8 oz  Weight (kg) 91.8 kg 93.8 kg 97.8 kg     There is no height or weight on file to calculate BMI.  General:  Pleasant male appearing in no acute distress HEENT: normal Neck: no JVD Vascular: No carotid bruits; Distal pulses 2+ bilaterally Cardiac:  normal S1, S2; RRR; no murmur  Lungs:  clear to  auscultation bilaterally, no wheezing, rhonchi or rales  Abd: appears distended.  Ext: no pitting edema Musculoskeletal:  No deformities, BUE and BLE strength normal and equal Skin: warm and dry  Neuro:  CNs 2-12 intact, no focal abnormalities noted Psych:  Normal affect   EKG:  The EKG was personally reviewed and demonstrates: NSR, HR 75 with first-degree AV block and baseline artifact. Telemetry:  Telemetry was personally reviewed and demonstrates:  NSR, HR in 60's to 70's.   Relevant CV Studies:  Echocardiogram: Pending  Laboratory Data:  High Sensitivity Troponin:   Recent Labs  Lab 08/27/22 0851  TROPONINIHS 82*     Chemistry Recent Labs  Lab 08/27/22 0851  NA 135  K 4.3  CL 100  CO2 25  GLUCOSE 142*  BUN 21  CREATININE 4.89*  CALCIUM 9.0  GFRNONAA 11*  ANIONGAP 10    Recent Labs  Lab 08/27/22 0851  PROT 7.5  ALBUMIN 3.7  AST 23  ALT 15  ALKPHOS 50  BILITOT 0.8   Lipids No results for input(s): "CHOL", "TRIG", "HDL", "LABVLDL", "LDLCALC", "CHOLHDL" in the last 168 hours.  Hematology Recent Labs  Lab 08/27/22 0851  WBC 10.2  RBC 3.01*  HGB 9.3*  HCT 30.3*  MCV 100.7*  MCH 30.9  MCHC 30.7  RDW 15.2  PLT 236   Thyroid No results for input(s): "TSH", "FREET4" in the last 168 hours.  BNPNo results for input(s): "BNP", "PROBNP" in the last 168 hours.  DDimer No results for input(s): "DDIMER" in the last 168 hours.   Radiology/Studies:  DG ABD ACUTE 2+V W 1V CHEST  Result Date: 08/27/2022 CLINICAL DATA:  Pain EXAM: DG ABDOMEN ACUTE WITH 1 VIEW CHEST COMPARISON:  CXR 10/23/20 FINDINGS: No pleural effusion. No pneumothorax. Normal cardiac and mediastinal contours. There are prominent bilateral interstitial opacities could represent pulmonary venous congestion or atypical infection. Nonobstructive bowel  gas pattern. Large colonic stool burden. No pneumatosis. No supine evidence of pneumoperitoneum. Bilateral hip arthroplasties. Degenerative changes in  the lower lumbar spine. IMPRESSION: 1. Prominent bilateral interstitial opacities could represent pulmonary venous congestion or atypical infection. 2. Nonobstructive bowel gas pattern. Large colonic stool burden. Electronically Signed   By: Lorenza Cambridge M.D.   On: 08/27/2022 07:53     Assessment and Plan:   1. Atypical Chest Pain/Elevated Hs Troponin - His chest pain is overall atypical for a cardiac etiology as this is mostly along his epigastric/right upper quadrant region and not along his chest and he reports this has been present for at least 24 hours and improves with belching and flatulence. No association with exertion. - Initial Hs Troponin was elevated at 82 with repeat values pending. If enzymes overall remain flat, this is likely secondary to demand ischemia in the setting of hypertensive urgency given his SBP in the 200's. If this trends significantly upwards, would need to consider starting Heparin and pursue further cardiac workup. While he did just have surgery last month, his story seems atypical for a PE and oxygen saturations are in the high-90's and he is not tachycardic.  - At this time, will obtain an echocardiogram to assess for any structural abnormalities. If enzymes remain flat and this is reassuring, would not anticipate further cardiac testing this admission.  2. Hypertensive Urgency - BP was at 216/78 on most recent check. He is on Coreg 12.5 mg twice daily and Hydralazine 50 mg twice daily at home. He just received PO Coreg and Hydralazine. Would follow and titrate medical therapy as needed.   3. HLD - Followed by his PCP. He remains on Crestor 10mg  daily.   4. ESRD - On HD - MWF schedule. He reports still producing urine and has been at his dry weight.   5. Abdominal Pain - Plain film of abdomen shows nonobstructive bowel gas pattern and large colonic stool burden. Received Lactulose yesterday with no improvement per his report. Management per ED  provider/admitting team.    For questions or updates, please contact [facility] Please consult [email or web address] for contact info under    Signed, Ellsworth Lennox, PA-C  08/27/2022 10:00 AM   Attending attestation  Patient seen and independently examined with Randall An, PA-C. We discussed all aspects of the encounter. I agree with the assessment and plan as stated above.   Patient is a 82 y/o M known to have HTN, DM2, ESRD DD c/c abdominal pain radiating to chest since last night which partially resolved with Gatorade but came to the ER to get evaluated. Also after he belched last night, his chest pressure partially subsided. He had HD yesterday and thinks this could be from too much fluid removal. No DOE. He did not take BP meds this AM. Had ongoing substernal chest pressure since last night 7/10 in intensity that came down to 5/10 now. Keeps denying chest pain but has chest pressure.  Never had this chest pressure before. EKG showed no ischemia. High sensitivity troponins were 82>>216.  BNP is 800s. Echocardiogram showed normal LVEF and no RWMA. He is active and independent at home. Preacher by profession. Imaging evidence of stool burden in the colon. Physical examination is remarkable for patient not in acute respite distress, HEENT normal, S1-S2 heard, systolic murmur present, clear lungs, nontender abdomen and nonpitting edema in bilateral lower extremities.  AOx3.  # Hypertensive emergency # NSTEMI -Has ongoing substernal chest pressure since last night. EKG  showed no ischemia. High-sensitivity troponins 82>>216. Continue ACS protocol and start NTG drip. Current chest pressure level is 5/10. Echocardiogram showed normal LVEF and no RWMA. He will benefit from transfer to [facility] for LHC due to NSTEMI and risk factors of HTN, DM 2, ESRD DD. - Risks and benefits of cardiac catheterization have been discussed with the patient. These include bleeding, infection, kidney  damage, stroke, heart attack, death. The patient understands these risks and is willing to proceed.  # Postop blood loss anemia -Patient had hip replacement 1 month ago (Hb dropped from 12 to 9).  No bleeding in the last 1 month.  Continue p.o. iron supplements.  I have spent a total of 75 minutes with patient reviewing chart , telemetry, EKGs, labs and examining patient as well as establishing an assessment and plan that was discussed with the patient.  > 50% of time was spent in direct patient care.     Donovyn Guidice Verne Spurr, MD [place]  [facility]  1:52 PM

## 2022-08-27 NOTE — ED Notes (Signed)
[facility] at bedside for transport to [facility]

## 2022-08-27 NOTE — Progress Notes (Signed)
Patient BP went down to 162/58 with nitroglycerin drip running at 40 mcg/min. Rate changed and titrated down to 35 mcg/ min. Day shift RN and this RN informed MD regarding rate changed, both were concern that the patient will be hypotensive after those PO anti hypertensive were also given.

## 2022-08-27 NOTE — ED Triage Notes (Signed)
Pt c/o chest pain since yesterday, denies sob, pt endorses N/V and unable to have a BM in 3 days

## 2022-08-27 NOTE — Progress Notes (Signed)
Secure chat to Drs. Emokpae and Patel PT's blood pressure and titrations. Requested direction for titration down one hour after PO cardiac medication administration. Awaiting response, RN Mardella Layman made aware and added to secure chat and she will follow up with covering hospitalist.

## 2022-08-27 NOTE — Progress Notes (Signed)
Patient at [facility] ER. To be transferred to [facility] for cardiac cath. Patient is ESRD. HD MWF at [facility]. I have contacted his HD unit earlier this AM and last HD was on Wed. Outpatient orders: [facility] MWF EDW 93 EKG, flow rates 400/500.  2K/2.5 calcium.  15-gauge.  Meds: Calcitriol 1.25 mcg every treatment, heparin 500 units hourly (no bolus), Venofer 50 mg once a week, Mircera 30 mcg every 2 weeks (received both Venofer and Mircera on Monday).  Last dialysis on 5/15  --Please contact covering nephrologist once patient is at [facility] for consultation. Will tentatively order his HD for tomorrow. Call with questions/concerns. Discussed with primary service.  Anthony Sar, MD [facility]

## 2022-08-27 NOTE — ED Notes (Signed)
Echo at bedside

## 2022-08-27 NOTE — Progress Notes (Signed)
ANTICOAGULATION CONSULT NOTE - Initial Consult  Pharmacy Consult for heparin Indication: chest pain/ACS  No Known Allergies  Patient Measurements:   Heparin Dosing Weight: 91 kg  Vital Signs: Temp: 98.2 F (36.8 C) (05/16 1123) Temp Source: Oral (05/16 1123) BP: 142/91 (05/16 1139) Pulse Rate: 71 (05/16 1139)  Labs: Recent Labs    08/27/22 0851 08/27/22 1106  HGB 9.3*  --   HCT 30.3*  --   PLT 236  --   CREATININE 4.89*  --   TROPONINIHS 82* 216*    CrCl cannot be calculated (Unknown ideal weight.).   Medical History: Past Medical History:  Diagnosis Date   Anemia    Chronic kidney disease    Stage 4?    Diabetes mellitus without complication (HCC)    Hepatitis    not sure what type - over 20 years ago   History of kidney stones    Hyperlipidemia    Hypertension     Medications:  (Not in a hospital admission)   Assessment: Pharmacy consulted to dose heparin in patient with chest pain/ACS.  Patient is not on anticoagulation prior to admission.    Hgb 9.3 Trop 216  Goal of Therapy:  Heparin level 0.3-0.7 units/ml Monitor platelets by anticoagulation protocol: Yes   Plan:  Give 4000 units bolus x 1 Start heparin infusion at 1200 units/hr Check anti-Xa level in 8 hours and daily while on heparin Continue to monitor H&H and platelets  Judeth Cornfield, PharmD Clinical Pharmacist 08/27/2022 11:54 AM

## 2022-08-27 NOTE — ED Notes (Signed)
L arm restricted, pink restriction armband placed on pts L wrist

## 2022-08-27 NOTE — ED Notes (Signed)
Pt gave this RN his car keys for [place] to keep here as he is being transported to [facility] and he drove himself here. Keys put in a bag with pt label on them. Security called to lock up keys. Pt informed security will lock up his car keys

## 2022-08-27 NOTE — Progress Notes (Signed)
   Echocardiogram 2D Echocardiogram has been performed.  Trevor [name] 08/27/2022, 12:47 PM

## 2022-08-27 NOTE — ED Notes (Addendum)
[facility] IV team at bedside attempting IV access

## 2022-08-27 NOTE — Progress Notes (Signed)
ANTICOAGULATION CONSULT NOTE   Pharmacy Consult for heparin Indication: chest pain/ACS  No Known Allergies  Patient Measurements: Height: 5\' 11"  (180.3 cm) Weight: 91.8 kg (202 lb 6.1 oz) IBW/kg (Calculated) : 75.3 Heparin Dosing Weight: 91 kg  Vital Signs: Temp: 98.6 F (37 C) (05/16 2001) Temp Source: Oral (05/16 2001) BP: 165/89 (05/16 2200) Pulse Rate: 66 (05/16 2045)  Labs: Recent Labs    08/27/22 0851 08/27/22 1106 08/27/22 1441 08/27/22 2237  HGB 9.3*  --   --   --   HCT 30.3*  --   --   --   PLT 236  --   --   --   HEPARINUNFRC  --   --   --  0.34  CREATININE 4.89*  --   --   --   TROPONINIHS 82* 216* 722*  --     Estimated Creatinine Clearance: 13.7 mL/min (A) (by C-G formula based on SCr of 4.89 mg/dL (H)).  Medical History: Past Medical History:  Diagnosis Date   Anemia    Chronic kidney disease    Stage 4?    Diabetes mellitus without complication (HCC)    Hepatitis    not sure what type - over 20 years ago   History of kidney stones    Hyperlipidemia    Hypertension    Medications:  Medications Prior to Admission  Medication Sig Dispense Refill Last Dose   acetaminophen (TYLENOL) 325 MG tablet Take 1-2 tablets (325-650 mg total) by mouth every 6 (six) hours as needed for mild pain (pain score 1-3 or temp > 100.5).   unknown   acetaminophen (TYLENOL) 500 MG tablet Take 1,000 mg by mouth every 6 (six) hours as needed for moderate pain.   08/26/2022   carvedilol (COREG) 12.5 MG tablet Take 12.5 mg by mouth 2 (two) times daily with a meal.   08/26/2022 at 2100   Cholecalciferol (VITAMIN D-3 PO) Take by mouth.   08/26/2022   CRANBERRY EXTRACT PO Take by mouth.   08/26/2022   hydrALAZINE (APRESOLINE) 100 MG tablet Take 50 mg by mouth 2 (two) times daily.   08/26/2022   insulin degludec (TRESIBA FLEXTOUCH) 200 UNIT/ML FlexTouch Pen 10-24 Units as needed (blood sugar). Sliding scale Under 100 Do Not Take   unknown   lactulose (CHRONULAC) 10 GM/15ML solution  Take 20 g by mouth daily as needed for moderate constipation.   08/26/2022   OVER THE COUNTER MEDICATION Stem Cell Capsules   08/26/2022   polyethylene glycol (MIRALAX / GLYCOLAX) 17 g packet Take 17 g by mouth daily as needed for mild constipation. 14 each 0 unknown   rosuvastatin (CRESTOR) 10 MG tablet Take 10 mg by mouth every evening.   08/26/2022   senna (SENOKOT) 8.6 MG TABS tablet Take 1 tablet (8.6 mg total) by mouth 2 (two) times daily. 120 tablet 0 unknown   ACCU-CHEK AVIVA PLUS test strip USE TO TEST BLOOD SUGAR BEFORE BREAKFAST AND AT BEDTIME 200 strip 2    Accu-Chek Softclix Lancets lancets       Alcohol Swabs (B-D SINGLE USE SWABS REGULAR) PADS       Blood Glucose Monitoring Suppl (ONETOUCH VERIO) w/Device KIT 1 each by Does not apply route as needed. 1 kit 0    ondansetron (ZOFRAN) 4 MG tablet Take 1 tablet (4 mg total) by mouth every 6 (six) hours as needed for nausea. (Patient not taking: Reported on 08/27/2022) 20 tablet 0 Not Taking   oxyCODONE (OXY IR/ROXICODONE)  5 MG immediate release tablet Take 1-2 tablets (5-10 mg total) by mouth every 4 (four) hours as needed for moderate pain (pain score 4-6). (Patient not taking: Reported on 08/27/2022) 5 tablet 0 Completed Course    Assessment: Pharmacy consulted to dose heparin in patient with chest pain/ACS.  Patient is not on anticoagulation prior to admission.    Hgb 9.3 Trop 216  5/16 PM update:  Heparin level therapeutic  Goal of Therapy:  Heparin level 0.3-0.7 units/ml Monitor platelets by anticoagulation protocol: Yes   Plan:  Cont heparin 1200 units/hr Heparin level with AM labs  Abran Duke, PharmD, BCPS Clinical Pharmacist Phone: [phone]

## 2022-08-27 NOTE — ED Notes (Signed)
Pts cane and shoes labeled and sent with pt with [facility]

## 2022-08-27 NOTE — TOC Initial Note (Signed)
Transition of Care [facility]) - Initial/Assessment Note    Patient Details  Name: Trevor [name] MRN: [id number] Date of Birth: Aug 21, 1940  [facility]) CM/SW Contact:    Lockie Pares, RN Phone Number: 08/27/2022, 5:26 PM  Clinical Narrative:                 Transferred from [facility] for possible Heart Cath In with HPTN . Has a history of ESRD last dialysis Wed. Car keys are locked up at [facility] via security. This was confirmed with RN in ED. He drove himself there and was admitted. He walks with a cane which he has with him.  No needs determined a this time.  The patient will be discussed on progression rounds. If a need is identified, please place a TOC consult.     Expected Discharge Plan: Home/Self Care Barriers to Discharge: Continued Medical Work up   Patient Goals and CMS Choice            Expected Discharge Plan and Services       Living arrangements for the past 2 months: Single Family Home                                      Prior Living Arrangements/Services Living arrangements for the past 2 months: Single Family Home Lives with:: Spouse Patient language and need for interpreter reviewed:: Yes        Need for Family Participation in Patient Care: Yes (Comment) Care giver support system in place?: Yes (comment)   Criminal Activity/Legal Involvement Pertinent to Current Situation/Hospitalization: No - Comment as needed  Activities of Daily Living      Permission Sought/Granted                  Emotional Assessment       Orientation: : Oriented to Self, Oriented to Place, Oriented to  Time   Psych Involvement: No (comment)  Admission diagnosis:  Acute exacerbation of CHF (congestive heart failure) (HCC) [I50.9] Patient Active Problem List   Diagnosis Date Noted   Acute exacerbation of CHF (congestive heart failure) (HCC) 08/27/2022   Hypertensive emergency 08/27/2022   Chest pain in adult 08/27/2022   Closed left hip  fracture (HCC) 07/21/2022   Peripherally inserted central venous catheter in situ 01/07/2021   Secondary hyperparathyroidism of renal origin (HCC) 01/07/2021   Hepatitis 01/07/2021   Hypertensive heart disease with end stage renal disease (HCC) 01/07/2021   Fracture of neck of femur (HCC) 11/08/2020   Hip fracture (HCC) 10/08/2020   End stage renal disease (HCC) 10/08/2020   Hyponatremia 10/08/2020   Hyperglycemia 10/08/2020   Hip pain 10/08/2020   History of renal dialysis 09/13/2020   Anemia 09/13/2020   Edema 09/13/2020   Essential hypertension 09/13/2020   Gastroesophageal reflux disease 09/13/2020   Hyperlipidemia 09/13/2020   Neuropathy 09/13/2020   Polyp of colon 09/13/2020   Proteinuria 09/13/2020   Latent tuberculosis 08/27/2020   Medication monitoring encounter 08/27/2020   Immunization counseling 08/27/2020   TB lung, latent 07/25/2020   Diabetes mellitus with stage 5 chronic kidney disease (HCC) 05/02/2020   Thyroid nodule 05/02/2020   Chronic kidney disease, stage 4 (severe) (HCC) 05/16/2019   Special screening for malignant neoplasms, colon    PCP:  Benita Stabile, MD Pharmacy:   [id number] - [place], [place] - [address] [place] [address] Phone: [phone] Fax: [phone]  [facility] - [place], [place] - [address] [place] [address] Phone: [phone] Fax: [phone]     Social Determinants of Health (SDOH) Social History: SDOH Screenings   Tobacco Use: Low Risk  (08/27/2022)   SDOH Interventions:     Readmission Risk Interventions     No data to display

## 2022-08-27 NOTE — ED Provider Notes (Signed)
Ultrasound ED Peripheral IV (Provider)  Date/Time: 08/27/2022 10:58 AM  Performed by: Gareth Eagle, PA-C Authorized by: Gareth Eagle, PA-C   Procedure details:    Indications: multiple failed IV attempts     Skin Prep: isopropyl alcohol     Location: right upprer arm.   Angiocath:  18 G   Bedside Ultrasound Guided: Yes     Patient tolerated procedure without complications: Yes     Dressing applied: Yes       Gareth Eagle, PA-C 08/27/22 1059    Bethann Berkshire, MD 08/29/22 1934

## 2022-08-27 NOTE — H&P (Signed)
Patient Demographics:    Trevor [name], is a 82 y.o. male  MRN: [id number]   DOB - Dec 21, 1940  Admit Date - 08/27/2022  Outpatient Primary MD for the patient is Benita Stabile, MD   Assessment & Plan:   Assessment and Plan:  1)Atypical Chest Pains--- -EKG shows sinus rhythm with first-degree AV block no acute ACS type findings Troponin 82 >> 216>>722--patient was started on IV heparin -Cardiology consult in the ED appreciated recommends transfer to [facility] for possible LHC -Patient was started on IV nitro drip due to chest pains and severely elevated BPs --Cardiology consult in the ED appreciated recommends transfer to [facility] for possible LHC -Patient was started on IV heparin and IV nitro drip due to chest pains and severely elevated BPs -Coreg as ordered -Aspirin and Crestor as ordered  2)Hypertensive Emergency----patient presenting with severe elevated BP, chest pains, chest x-ray findings of pulmonary venous congestion as well as severely elevated troponins --In the ED patient is found to have severely elevated BP with systolic blood pressure of 222 diastolic blood pressure was as high as 132 -IV nitro drip as ordered to be titrated for BP control -Coreg 12.5 mg twice daily -Hydralazine 100 mg 3 times daily  3)Constipation--- abdominal x-rays with large stool burden patient has not had a BM in about 4 days now despite laxatives at home -- Bowel regimen/laxatives optimized  4)DM2-recent A1c was 5.7 reflecting excellent diabetic control PTA -Lantus insulin 10 units daily Use Novolog/Humalog Sliding scale insulin with Accu-Cheks/Fingersticks as ordered   5) chronic anemia of chronic Kidney disease as well as iron deficiency anemia--- --Hemoglobin 9.3--which is close to patient's baseline -Has iron  deficiency with iron saturation 10% -Will need IV iron as outpatient  6)ESRD-----He tolerated hemodialysis on 08/26/2022 for over 3 hours and he believes that he achieved his dry weight -Nephrology consult for HD requested  7)HFpEF---- - BNP is elevated at 867--- -chest x-rays with pulmonary venous congestion -Echo today with EF greater than 75% moderate LVH -No hypoxia -Continue to use hemodialysis to address volume status  Status is: Inpatient  Remains inpatient appropriate because:   Dispo: The patient is from: Home              Anticipated d/c is to: Home              Anticipated d/c date is: 2 days              Patient currently is not medically stable to d/c. Barriers: Not Clinically Stable-    With History of - Reviewed by me  Past Medical History:  Diagnosis Date   Anemia    Chronic kidney disease    Stage 4?    Diabetes mellitus without complication (HCC)    Hepatitis    not sure what type - over 20 years ago   History of kidney stones    Hyperlipidemia    Hypertension  Past Surgical History:  Procedure Laterality Date   AV FISTULA PLACEMENT Left 08/09/2019   Procedure: LEFT ARM Basilic  ARTERIOVENOUS  FISTULA CREATION;  Surgeon: Maeola Harman, MD;  Location: [facility] OR;  Service: Vascular;  Laterality: Left;   AV FISTULA PLACEMENT Left 12/17/2020   Procedure: INSERTION OF ARTERIOVENOUS (AV) GORE-TEX GRAFT LEFT ARM;  Surgeon: Maeola Harman, MD;  Location: [facility] OR;  Service: Vascular;  Laterality: Left;   BASCILIC VEIN TRANSPOSITION Left 11/30/2019   Procedure: LEFT SECOND STAGE BASCILIC VEIN TRANSPOSITION;  Surgeon: Maeola Harman, MD;  Location: [facility] OR;  Service: Vascular;  Laterality: Left;   COLONOSCOPY N/A 07/07/2017   Procedure: COLONOSCOPY;  Surgeon: West Bali, MD;  Location: [facility];  Service: Endoscopy;  Laterality: N/A;  8:30   IR FLUORO GUIDE CV LINE RIGHT  09/06/2020   IR US GUIDE VASC ACCESS RIGHT  09/06/2020    TOTAL HIP ARTHROPLASTY Right 10/09/2020   Procedure: TOTAL HIP ARTHROPLASTY ANTERIOR APPROACH;  Surgeon: [name], MD;  Location: [facility] OR;  Service: Orthopedics;  Laterality: Right;   TOTAL HIP ARTHROPLASTY Left 07/23/2022   Procedure: TOTAL HIP ARTHROPLASTY ANTERIOR APPROACH;  Surgeon: [name], MD;  Location: [facility] OR;  Service: Orthopedics;  Laterality: Left;   Chief Complaint  Patient presents with   Chest Pain      HPI:    Trevor [name]  is a 82 y.o. male with PMHx relevant for ESRD, on HD MWF, DM2, HTN, HLD, anemia of chronic kidney disease presents to the ED with complaints of chest pain 08/26/2022 associated with nausea and vomiting, denies any significant shortness of breath no palpitations no dizziness.  No leg swelling leg pains or pleuritic type symptoms -No fever  Or chills  -Emesis was without bile or blood  -No BM in 3 to 4 days despite laxatives at home -PTA patient was on aspirin 81 mg twice daily for VT prophylaxis after lengthy total replacement on 07/23/2022 -He tolerated hemodialysis on 08/26/2022 for over 3 hours and he believes that he achieved his dry weight -In the ED patient is found to have severely elevated BP with systolic blood pressure of 222 diastolic blood pressure was as high as 132 -EKG shows sinus rhythm with first-degree AV block no acute ACS type findings Troponin 82 >> 216>>722--patient was started on IV heparin - BNP is elevated at 867--- -chest and abdominal x-rays suggest pulmonary venous congestion cannot rule out atypical infection, large colonic stool burden noted without bowel obstruction -Lipase is 44 -Sodium is 135 potassium is 4.3 glucose 142 creatinine 4.89 -LFTs are not elevated -Hemoglobin 9.3--which is close to patient's baseline -WBC is 10.2 and platelets are 236 -Cardiology consult in the ED appreciated recommends transfer to [facility] for possible LHC -Patient was started on IV heparin and IV nitro drip due to chest pains  and severely elevated BPs   Review of systems:    In addition to the HPI above,   A full Review of  Systems was done, all other systems reviewed are negative except as noted above in HPI , .    Social History:  Reviewed by me    Social History   Tobacco Use   Smoking status: Never   Smokeless tobacco: Never  Substance Use Topics   Alcohol use: Never    Family History :  Reviewed by me    Family History  Problem Relation Age of Onset   Diabetes Mother    Diabetes Brother  Heart attack Brother    Colon cancer Son    Lung cancer Son     Home Medications:   Prior to Admission medications   Medication Sig Start Date End Date Taking? Authorizing Provider  acetaminophen (TYLENOL) 325 MG tablet Take 1-2 tablets (325-650 mg total) by mouth every 6 (six) hours as needed for mild pain (pain score 1-3 or temp > 100.5). 07/28/22  Yes Meredeth Ide, MD  acetaminophen (TYLENOL) 500 MG tablet Take 1,000 mg by mouth every 6 (six) hours as needed for moderate pain.   Yes [provider]  carvedilol (COREG) 12.5 MG tablet Take 12.5 mg by mouth 2 (two) times daily with a meal. 10/18/19  Yes [provider]  Cholecalciferol (VITAMIN D-3 PO) Take by mouth.   Yes [provider]  CRANBERRY EXTRACT PO Take by mouth.   Yes [provider]  hydrALAZINE (APRESOLINE) 100 MG tablet Take 50 mg by mouth 2 (two) times daily. 07/03/20  Yes [provider]  insulin degludec (TRESIBA FLEXTOUCH) 200 UNIT/ML FlexTouch Pen 10-24 Units as needed (blood sugar). Sliding scale Under 100 Do Not Take   Yes [provider]  lactulose (CHRONULAC) 10 GM/15ML solution Take 20 g by mouth daily as needed for moderate constipation. 08/21/22  Yes [provider]  OVER THE COUNTER MEDICATION Stem Cell Capsules   Yes [provider]  polyethylene glycol (MIRALAX / GLYCOLAX) 17 g packet Take 17 g by mouth daily as needed for mild constipation. 07/28/22  Yes  Meredeth Ide, MD  rosuvastatin (CRESTOR) 10 MG tablet Take 10 mg by mouth every evening. 01/26/17  Yes [provider]  senna (SENOKOT) 8.6 MG TABS tablet Take 1 tablet (8.6 mg total) by mouth 2 (two) times daily. 07/28/22  Yes Meredeth Ide, MD  ACCU-CHEK AVIVA PLUS test strip USE TO TEST BLOOD SUGAR BEFORE BREAKFAST AND AT BEDTIME 05/26/21   Roma Kayser, MD  Accu-Chek Softclix Lancets lancets  09/13/19   [provider]  Alcohol Swabs (B-D SINGLE USE SWABS REGULAR) PADS  08/11/19   [provider]  Blood Glucose Monitoring Suppl (ONETOUCH VERIO) w/Device KIT 1 each by Does not apply route as needed. 05/03/20   Roma Kayser, MD  ondansetron (ZOFRAN) 4 MG tablet Take 1 tablet (4 mg total) by mouth every 6 (six) hours as needed for nausea. Patient not taking: Reported on 08/27/2022 07/28/22   Meredeth Ide, MD  oxyCODONE (OXY IR/ROXICODONE) 5 MG immediate release tablet Take 1-2 tablets (5-10 mg total) by mouth every 4 (four) hours as needed for moderate pain (pain score 4-6). Patient not taking: Reported on 08/27/2022 07/28/22   Meredeth Ide, MD     Allergies:    No Known Allergies   Physical Exam:   Vitals  Blood pressure (!) 153/132, pulse 72, temperature 98.2 F (36.8 C), temperature source Oral, resp. rate 19, height 5\' 11"  (1.803 m), weight 91.8 kg, SpO2 97 %.  Temp:  [98.1 F (36.7 C)-98.3 F (36.8 C)] 98.2 F (36.8 C) (05/16 1530) Pulse Rate:  [66-79] 72 (05/16 1600) Resp:  [12-26] 19 (05/16 1600) BP: (125-222)/(75-148) 153/132 (05/16 1600) SpO2:  [94 %-100 %] 97 % (05/16 1600) Weight:  [91.8 kg] 91.8 kg (05/16 1152)  Physical Examination: General appearance - alert,  in no distress, no conversational dyspnea Mental status - alert, oriented to person, place, and time,  Eyes - sclera anicteric Neck - supple, no JVD elevation , Chest -diminished  breath sounds with faint bibasilar rales  heart - S1 and S2 normal, regular  Abdomen -  soft, nontender, nondistended, +BS Neurological - screening mental status exam normal, neck supple without rigidity, cranial nerves II through XII intact, DTR's normal and symmetric Extremities - no pedal edema noted, intact peripheral pulses  Skin - warm, dry MSK-left arm AV fistula with positive thrill and bruit   Data Review:    CBC Recent Labs  Lab 08/27/22 0851  WBC 10.2  HGB 9.3*  HCT 30.3*  PLT 236  MCV 100.7*  MCH 30.9  MCHC 30.7  RDW 15.2  LYMPHSABS 1.1  MONOABS 0.8  EOSABS 0.1  BASOSABS 0.1   ------------------------------------------------------------------------------------------------------------------  Chemistries  Recent Labs  Lab 08/27/22 0851  NA 135  K 4.3  CL 100  CO2 25  GLUCOSE 142*  BUN 21  CREATININE 4.89*  CALCIUM 9.0  AST 23  ALT 15  ALKPHOS 50  BILITOT 0.8   ------------------------------------------------------------------------------------------------------------------ estimated creatinine clearance is 13.7 mL/min (A) (by C-G formula based on SCr of 4.89 mg/dL (H)). ------------------------------------------------------------------------------------------------------------------ -----------------------------------------------------------------------------    Component Value Date/Time   BNP 867.0 (H) 08/27/2022 1106   Urinalysis    Component Value Date/Time   COLORURINE YELLOW 09/06/2020 1003   APPEARANCEUR CLEAR 09/06/2020 1003   LABSPEC 1.011 09/06/2020 1003   PHURINE 7.0 09/06/2020 1003   GLUCOSEU NEGATIVE 09/06/2020 1003   HGBUR NEGATIVE 09/06/2020 1003   BILIRUBINUR NEGATIVE 09/06/2020 1003   KETONESUR NEGATIVE 09/06/2020 1003   PROTEINUR >=300 (A) 09/06/2020 1003   NITRITE NEGATIVE 09/06/2020 1003   LEUKOCYTESUR TRACE (A) 09/06/2020 1003     Imaging Results:    ECHOCARDIOGRAM COMPLETE  Result Date: 08/27/2022    ECHOCARDIOGRAM REPORT   Patient Name:   MARTE ALLDAY [facility] Date of Exam: 08/27/2022 Medical Rec #:   [id number]        Height:       71.0 in Accession #:    [id number]       Weight:       202.4 lb Date of Birth:  January 11, 1941        BSA:          2.119 m Patient Age:    82 years         BP:           198/95 mmHg Patient Gender: M                HR:           70 bpm. [facility] Procedure: 2D Echo, Cardiac Doppler, Color Doppler and Intracardiac            Opacification Agent Indications:    Chest Pain R07.9  History:        Patient has no prior history of Echocardiogram examinations.                 Signs/Symptoms:Chest Pain; Risk Factors:Diabetes, Non-Smoker,                 Hypertension and Dyslipidemia.  Sonographer:    Aron Baba Referring Phys: [id number] Ellsworth Lennox  Sonographer Comments: Image acquisition challenging due to patient body habitus. IMPRESSIONS  1. Left ventricular ejection fraction, by estimation, is >75%. The left ventricle has hyperdynamic function. The left ventricle has no regional wall motion abnormalities. There is moderate left ventricular hypertrophy. Left ventricular diastolic parameters were normal.  2. Right ventricular systolic function was not well visualized. The right  ventricular size is mildly enlarged. There is normal pulmonary artery systolic pressure.  3. Left atrial size was mildly dilated.  4. The mitral valve is normal in structure. No evidence of mitral valve regurgitation. No evidence of mitral stenosis.  5. The aortic valve is tricuspid. Aortic valve regurgitation is not visualized. No aortic stenosis is present.  6. The inferior vena cava is normal in size with greater than 50% respiratory variability, suggesting right atrial pressure of 3 mmHg. Comparison(s): No prior Echocardiogram. FINDINGS  Left Ventricle: Left ventricular ejection fraction, by estimation, is >75%. The left ventricle has hyperdynamic function. The left ventricle has no regional wall motion abnormalities. Definity contrast agent was given IV to delineate the left ventricular  endocardial borders. The left ventricular internal cavity size was normal in size. There is moderate left ventricular hypertrophy. Left ventricular diastolic parameters were normal. Right Ventricle: The right ventricular size is mildly enlarged. No increase in right ventricular wall thickness. Right ventricular systolic function was not well visualized. There is normal pulmonary artery systolic pressure. The tricuspid regurgitant velocity is 1.46 m/s, and with an assumed right atrial pressure of 3 mmHg, the estimated right ventricular systolic pressure is 11.5 mmHg. Left Atrium: Left atrial size was mildly dilated. Right Atrium: Right atrial size was normal in size. Pericardium: There is no evidence of pericardial effusion. Mitral Valve: The mitral valve is normal in structure. No evidence of mitral valve regurgitation. No evidence of mitral valve stenosis. MV peak gradient, 8.3 mmHg. The mean mitral valve gradient is 5.0 mmHg. Tricuspid Valve: The tricuspid valve is normal in structure. Tricuspid valve regurgitation is trivial. No evidence of tricuspid stenosis. Aortic Valve: The aortic valve is tricuspid. Aortic valve regurgitation is not visualized. No aortic stenosis is present. Pulmonic Valve: The pulmonic valve was not well visualized. Pulmonic valve regurgitation is trivial. No evidence of pulmonic stenosis. Aorta: The aortic root is normal in size and structure. Venous: The inferior vena cava is normal in size with greater than 50% respiratory variability, suggesting right atrial pressure of 3 mmHg. IAS/Shunts: No atrial level shunt detected by color flow Doppler.  LEFT VENTRICLE PLAX 2D LVIDd:         4.00 cm   Diastology LVIDs:         2.10 cm   LV e' medial:    6.67 cm/s LV PW:         1.30 cm   LV E/e' medial:  12.1 LV IVS:        1.20 cm   LV e' lateral:   8.36 cm/s LVOT diam:     1.90 cm   LV E/e' lateral: 9.7 LV SV:         56 LV SV Index:   27 LVOT Area:     2.84 cm  LEFT ATRIUM             Index         RIGHT ATRIUM           Index LA diam:        3.90 cm 1.84 cm/m   RA Area:     16.30 cm LA Vol (A2C):   86.0 ml 40.58 ml/m  RA Volume:   49.60 ml  23.41 ml/m LA Vol (A4C):   73.7 ml 34.78 ml/m LA Biplane Vol: 80.0 ml 37.75 ml/m  AORTIC VALVE             PULMONIC VALVE LVOT Vmax:   84.30 cm/s  PR  End Diast Vel: 5.76 msec LVOT Vmean:  58.600 cm/s LVOT VTI:    0.199 m  AORTA Ao Root diam: 3.50 cm MITRAL VALVE               TRICUSPID VALVE MV Area (PHT): 4.21 cm    TR Peak grad:   8.5 mmHg MV Area VTI:   2.17 cm    TR Vmax:        146.00 cm/s MV Peak grad:  8.3 mmHg MV Mean grad:  5.0 mmHg    SHUNTS MV Vmax:       1.44 m/s    Systemic VTI:  0.20 m MV Vmean:      104.0 cm/s  Systemic Diam: 1.90 cm MV Decel Time: 180 msec MV E velocity: 80.80 cm/s MV A velocity: 89.40 cm/s MV E/A ratio:  0.90 Vishnu Priya Mallipeddi Electronically signed by Winfield Rast Mallipeddi Signature Date/Time: 08/27/2022/12:54:10 PM    Final    DG ABD ACUTE 2+V W 1V CHEST  Result Date: 08/27/2022 CLINICAL DATA:  Pain EXAM: DG ABDOMEN ACUTE WITH 1 VIEW CHEST COMPARISON:  CXR 10/23/20 FINDINGS: No pleural effusion. No pneumothorax. Normal cardiac and mediastinal contours. There are prominent bilateral interstitial opacities could represent pulmonary venous congestion or atypical infection. Nonobstructive bowel gas pattern. Large colonic stool burden. No pneumatosis. No supine evidence of pneumoperitoneum. Bilateral hip arthroplasties. Degenerative changes in the lower lumbar spine. IMPRESSION: 1. Prominent bilateral interstitial opacities could represent pulmonary venous congestion or atypical infection. 2. Nonobstructive bowel gas pattern. Large colonic stool burden. Electronically Signed   By: Lorenza Cambridge M.D.   On: 08/27/2022 07:53    Radiological Exams on Admission: ECHOCARDIOGRAM COMPLETE  Result Date: 08/27/2022    ECHOCARDIOGRAM REPORT   Patient Name:   NOUMAN MALAGON [facility] Date of Exam: 08/27/2022 Medical Rec #:  [id number]         Height:       71.0 in Accession #:    [id number]       Weight:       202.4 lb Date of Birth:  [birth year]        BSA:          2.119 m Patient Age:    82 years         BP:           198/95 mmHg Patient Gender: M                HR:           70 bpm. [facility] Procedure: 2D Echo, Cardiac Doppler, Color Doppler and Intracardiac            Opacification Agent Indications:    Chest Pain R07.9  History:        Patient has no prior history of Echocardiogram examinations.                 Signs/Symptoms:Chest Pain; Risk Factors:Diabetes, Non-Smoker,                 Hypertension and Dyslipidemia.  Sonographer:    Aron Baba Referring Phys: [id number] Ellsworth Lennox  Sonographer Comments: Image acquisition challenging due to patient body habitus. IMPRESSIONS  1. Left ventricular ejection fraction, by estimation, is >75%. The left ventricle has hyperdynamic function. The left ventricle has no regional wall motion abnormalities. There is moderate left ventricular hypertrophy. Left ventricular diastolic parameters were normal.  2. Right ventricular systolic function was not well visualized.  The right ventricular size is mildly enlarged. There is normal pulmonary artery systolic pressure.  3. Left atrial size was mildly dilated.  4. The mitral valve is normal in structure. No evidence of mitral valve regurgitation. No evidence of mitral stenosis.  5. The aortic valve is tricuspid. Aortic valve regurgitation is not visualized. No aortic stenosis is present.  6. The inferior vena cava is normal in size with greater than 50% respiratory variability, suggesting right atrial pressure of 3 mmHg. Comparison(s): No prior Echocardiogram. FINDINGS  Left Ventricle: Left ventricular ejection fraction, by estimation, is >75%. The left ventricle has hyperdynamic function. The left ventricle has no regional wall motion abnormalities. Definity contrast agent was given IV to delineate the left ventricular endocardial borders.  The left ventricular internal cavity size was normal in size. There is moderate left ventricular hypertrophy. Left ventricular diastolic parameters were normal. Right Ventricle: The right ventricular size is mildly enlarged. No increase in right ventricular wall thickness. Right ventricular systolic function was not well visualized. There is normal pulmonary artery systolic pressure. The tricuspid regurgitant velocity is 1.46 m/s, and with an assumed right atrial pressure of 3 mmHg, the estimated right ventricular systolic pressure is 11.5 mmHg. Left Atrium: Left atrial size was mildly dilated. Right Atrium: Right atrial size was normal in size. Pericardium: There is no evidence of pericardial effusion. Mitral Valve: The mitral valve is normal in structure. No evidence of mitral valve regurgitation. No evidence of mitral valve stenosis. MV peak gradient, 8.3 mmHg. The mean mitral valve gradient is 5.0 mmHg. Tricuspid Valve: The tricuspid valve is normal in structure. Tricuspid valve regurgitation is trivial. No evidence of tricuspid stenosis. Aortic Valve: The aortic valve is tricuspid. Aortic valve regurgitation is not visualized. No aortic stenosis is present. Pulmonic Valve: The pulmonic valve was not well visualized. Pulmonic valve regurgitation is trivial. No evidence of pulmonic stenosis. Aorta: The aortic root is normal in size and structure. Venous: The inferior vena cava is normal in size with greater than 50% respiratory variability, suggesting right atrial pressure of 3 mmHg. IAS/Shunts: No atrial level shunt detected by color flow Doppler.  LEFT VENTRICLE PLAX 2D LVIDd:         4.00 cm   Diastology LVIDs:         2.10 cm   LV e' medial:    6.67 cm/s LV PW:         1.30 cm   LV E/e' medial:  12.1 LV IVS:        1.20 cm   LV e' lateral:   8.36 cm/s LVOT diam:     1.90 cm   LV E/e' lateral: 9.7 LV SV:         56 LV SV Index:   27 LVOT Area:     2.84 cm  LEFT ATRIUM             Index        RIGHT ATRIUM            Index LA diam:        3.90 cm 1.84 cm/m   RA Area:     16.30 cm LA Vol (A2C):   86.0 ml 40.58 ml/m  RA Volume:   49.60 ml  23.41 ml/m LA Vol (A4C):   73.7 ml 34.78 ml/m LA Biplane Vol: 80.0 ml 37.75 ml/m  AORTIC VALVE             PULMONIC VALVE LVOT Vmax:   84.30 cm/s  PR End Diast Vel: 5.76 msec LVOT Vmean:  58.600 cm/s LVOT VTI:    0.199 m  AORTA Ao Root diam: 3.50 cm MITRAL VALVE               TRICUSPID VALVE MV Area (PHT): 4.21 cm    TR Peak grad:   8.5 mmHg MV Area VTI:   2.17 cm    TR Vmax:        146.00 cm/s MV Peak grad:  8.3 mmHg MV Mean grad:  5.0 mmHg    SHUNTS MV Vmax:       1.44 m/s    Systemic VTI:  0.20 m MV Vmean:      104.0 cm/s  Systemic Diam: 1.90 cm MV Decel Time: 180 msec MV E velocity: 80.80 cm/s MV A velocity: 89.40 cm/s MV E/A ratio:  0.90 Vishnu Priya Mallipeddi Electronically signed by Winfield Rast Mallipeddi Signature Date/Time: 08/27/2022/12:54:10 PM    Final    DG ABD ACUTE 2+V W 1V CHEST  Result Date: 08/27/2022 CLINICAL DATA:  Pain EXAM: DG ABDOMEN ACUTE WITH 1 VIEW CHEST COMPARISON:  CXR 10/23/20 FINDINGS: No pleural effusion. No pneumothorax. Normal cardiac and mediastinal contours. There are prominent bilateral interstitial opacities could represent pulmonary venous congestion or atypical infection. Nonobstructive bowel gas pattern. Large colonic stool burden. No pneumatosis. No supine evidence of pneumoperitoneum. Bilateral hip arthroplasties. Degenerative changes in the lower lumbar spine. IMPRESSION: 1. Prominent bilateral interstitial opacities could represent pulmonary venous congestion or atypical infection. 2. Nonobstructive bowel gas pattern. Large colonic stool burden. Electronically Signed   By: Lorenza Cambridge M.D.   On: 08/27/2022 07:53    DVT Prophylaxis -SCD/Iv Heparin AM Labs Ordered, also please review Full Orders  Family Communication: Admission, patients condition and plan of care including tests being ordered have been discussed with the patient   who indicate understanding and agree with the plan   Condition  -stable  Shon Hale M.D on 08/27/2022 at 5:23 PM Go to [email or web address] -  for contact info  [facility] - Office  [phone]

## 2022-08-27 NOTE — ED Notes (Addendum)
Pts ultrasound IV infiltrated, bolus of IV heparin was going, pt did not get barely any of the heparin as RN and MD was at bedside when he started complaining of pain. IV taken out and MD made aware. Warm packs applied and extremity elevated, heparin paused. While resume when IV line established, cone IV team at bedside

## 2022-08-27 NOTE — ED Notes (Signed)
PA-C at bedside to attempt ultrasound IV

## 2022-08-27 NOTE — Plan of Care (Signed)

## 2022-08-28 ENCOUNTER — Other Ambulatory Visit: Payer: Self-pay

## 2022-08-28 ENCOUNTER — Encounter (HOSPITAL_COMMUNITY)
Admission: EM | Disposition: A | Discharge status: Home / Self Care | Payer: Self-pay | Source: Home / Self Care | Attending: Internal Medicine

## 2022-08-28 DIAGNOSIS — I2510 Atherosclerotic heart disease of native coronary artery without angina pectoris: Secondary | ICD-10-CM

## 2022-08-28 DIAGNOSIS — I161 Hypertensive emergency: Secondary | ICD-10-CM | POA: Diagnosis not present

## 2022-08-28 HISTORY — PX: LEFT HEART CATH AND CORONARY ANGIOGRAPHY: CATH118249

## 2022-08-28 HISTORY — PX: CORONARY ATHERECTOMY: CATH118238

## 2022-08-28 HISTORY — PX: CORONARY STENT INTERVENTION: CATH118234

## 2022-08-28 HISTORY — PX: CORONARY PRESSURE/FFR STUDY: CATH118243

## 2022-08-28 HISTORY — PX: CORONARY IMAGING/OCT: CATH118326

## 2022-08-28 LAB — HEPARIN LEVEL (UNFRACTIONATED): Heparin Unfractionated: 0.22 IU/mL — ABNORMAL LOW (ref 0.30–0.70)

## 2022-08-28 LAB — POCT ACTIVATED CLOTTING TIME
Activated Clotting Time: 228 seconds
Activated Clotting Time: 255 seconds
Activated Clotting Time: 477 seconds
Activated Clotting Time: 239 seconds

## 2022-08-28 LAB — RENAL FUNCTION PANEL
Albumin: 3.5 g/dL (ref 3.5–5.0)
Anion gap: 18 — ABNORMAL HIGH (ref 5–15)
BUN: 26 mg/dL — ABNORMAL HIGH (ref 8–23)
CO2: 19 mmol/L — ABNORMAL LOW (ref 22–32)
Calcium: 8.8 mg/dL — ABNORMAL LOW (ref 8.9–10.3)
Chloride: 95 mmol/L — ABNORMAL LOW (ref 98–111)
Creatinine, Ser: 6.46 mg/dL — ABNORMAL HIGH (ref 0.61–1.24)
GFR, Estimated: 8 mL/min — ABNORMAL LOW (ref >60)
Glucose, Bld: 183 mg/dL — ABNORMAL HIGH (ref 70–99)
Phosphorus: 3.6 mg/dL (ref 2.5–4.6)
Potassium: 3.9 mmol/L (ref 3.5–5.1)
Sodium: 132 mmol/L — ABNORMAL LOW (ref 135–145)

## 2022-08-28 LAB — CBC
HCT: 27.1 % — ABNORMAL LOW (ref 39.0–52.0)
HCT: 28 % — ABNORMAL LOW (ref 39.0–52.0)
Hemoglobin: 8.6 g/dL — ABNORMAL LOW (ref 13.0–17.0)
Hemoglobin: 9 g/dL — ABNORMAL LOW (ref 13.0–17.0)
MCH: 30.6 pg (ref 26.0–34.0)
MCH: 30.5 pg (ref 26.0–34.0)
MCHC: 31.7 g/dL (ref 30.0–36.0)
MCHC: 32.1 g/dL (ref 30.0–36.0)
MCV: 96.4 fL (ref 80.0–100.0)
MCV: 94.9 fL (ref 80.0–100.0)
Platelets: 256 10*3/uL (ref 150–400)
Platelets: 289 10*3/uL (ref 150–400)
RBC: 2.81 MIL/uL — ABNORMAL LOW (ref 4.22–5.81)
RBC: 2.95 MIL/uL — ABNORMAL LOW (ref 4.22–5.81)
RDW: 15.2 % (ref 11.5–15.5)
RDW: 15.7 % — ABNORMAL HIGH (ref 11.5–15.5)
WBC: 12.2 10*3/uL — ABNORMAL HIGH (ref 4.0–10.5)
WBC: 16.6 10*3/uL — ABNORMAL HIGH (ref 4.0–10.5)
nRBC: 0 % (ref 0.0–0.2)
nRBC: 0 % (ref 0.0–0.2)

## 2022-08-28 LAB — COMPREHENSIVE METABOLIC PANEL
ALT: 13 U/L (ref 0–44)
AST: 34 U/L (ref 15–41)
Albumin: 3.2 g/dL — ABNORMAL LOW (ref 3.5–5.0)
Alkaline Phosphatase: 43 U/L (ref 38–126)
Anion gap: 11 (ref 5–15)
BUN: 22 mg/dL (ref 8–23)
CO2: 23 mmol/L (ref 22–32)
Calcium: 8.7 mg/dL — ABNORMAL LOW (ref 8.9–10.3)
Chloride: 100 mmol/L (ref 98–111)
Creatinine, Ser: 5.86 mg/dL — ABNORMAL HIGH (ref 0.61–1.24)
GFR, Estimated: 9 mL/min — ABNORMAL LOW (ref >60)
Glucose, Bld: 160 mg/dL — ABNORMAL HIGH (ref 70–99)
Potassium: 3.6 mmol/L (ref 3.5–5.1)
Sodium: 134 mmol/L — ABNORMAL LOW (ref 135–145)
Total Bilirubin: 0.7 mg/dL (ref 0.3–1.2)
Total Protein: 6.6 g/dL (ref 6.5–8.1)

## 2022-08-28 LAB — GLUCOSE, CAPILLARY
Glucose-Capillary: 123 mg/dL — ABNORMAL HIGH (ref 70–99)
Glucose-Capillary: 144 mg/dL — ABNORMAL HIGH (ref 70–99)
Glucose-Capillary: 190 mg/dL — ABNORMAL HIGH (ref 70–99)
Glucose-Capillary: 97 mg/dL (ref 70–99)

## 2022-08-28 SURGERY — LEFT HEART CATH AND CORONARY ANGIOGRAPHY
Anesthesia: LOCAL

## 2022-08-28 MED ORDER — ONDANSETRON HCL 4 MG/2ML IJ SOLN: 4.0000 mg | Freq: Four times a day (QID) | INTRAMUSCULAR | Status: DC | PRN

## 2022-08-28 MED ORDER — LIDOCAINE HCL (PF) 1 % IJ SOLN
INTRAMUSCULAR | Status: DC | PRN
  Administered 2022-08-28: 10 mL via INTRADERMAL

## 2022-08-28 MED ORDER — HYDRALAZINE HCL 20 MG/ML IJ SOLN
INTRAMUSCULAR | Status: DC | PRN
  Administered 2022-08-28 (×3): 10 mg via INTRAVENOUS

## 2022-08-28 MED ORDER — HEPARIN SODIUM (PORCINE) 1000 UNIT/ML DIALYSIS: 1000.0000 [IU] | INTRAMUSCULAR | Status: DC | PRN

## 2022-08-28 MED ORDER — LIDOCAINE-PRILOCAINE 2.5-2.5 % EX CREA
1.0000 | TOPICAL_CREAM | CUTANEOUS | Status: DC | PRN
  Filled 2022-08-28: qty 5

## 2022-08-28 MED ORDER — FENTANYL CITRATE (PF) 100 MCG/2ML IJ SOLN
INTRAMUSCULAR | Status: AC
  Filled 2022-08-28: qty 2

## 2022-08-28 MED ORDER — PENTAFLUOROPROP-TETRAFLUOROETH EX AERO: 1.0000 | INHALATION_SPRAY | CUTANEOUS | Status: DC | PRN

## 2022-08-28 MED ORDER — RENA-VITE PO TABS
1.0000 | ORAL_TABLET | Freq: Every day | ORAL | Status: DC
  Administered 2022-08-29: 1 via ORAL
  Filled 2022-08-28: qty 1

## 2022-08-28 MED ORDER — SODIUM CHLORIDE 0.9% FLUSH: 3.0000 mL | Freq: Two times a day (BID) | INTRAVENOUS | Status: DC

## 2022-08-28 MED ORDER — HEPARIN BOLUS VIA INFUSION
2000.0000 [IU] | Freq: Once | INTRAVENOUS | Status: AC
  Administered 2022-08-28: 2000 [IU] via INTRAVENOUS
  Filled 2022-08-28: qty 2000

## 2022-08-28 MED ORDER — MIDAZOLAM HCL 2 MG/2ML IJ SOLN
INTRAMUSCULAR | Status: AC
  Filled 2022-08-28: qty 2

## 2022-08-28 MED ORDER — SODIUM CHLORIDE 0.9 % IV SOLN: 250.0000 mL | INTRAVENOUS | Status: DC | PRN

## 2022-08-28 MED ORDER — HEPARIN SODIUM (PORCINE) 1000 UNIT/ML IJ SOLN
INTRAMUSCULAR | Status: DC | PRN
  Administered 2022-08-28: 1000 [IU] via INTRAVENOUS
  Administered 2022-08-28: 6000 [IU] via INTRAVENOUS
  Administered 2022-08-28 (×2): 1000 [IU] via INTRAVENOUS

## 2022-08-28 MED ORDER — TICAGRELOR 90 MG PO TABS
90.0000 mg | ORAL_TABLET | Freq: Two times a day (BID) | ORAL | Status: DC
  Administered 2022-08-29 (×2): 90 mg via ORAL
  Filled 2022-08-28 (×2): qty 1

## 2022-08-28 MED ORDER — LOPERAMIDE HCL 2 MG PO CAPS
2.0000 mg | ORAL_CAPSULE | ORAL | Status: DC | PRN
  Administered 2022-08-28: 2 mg via ORAL
  Filled 2022-08-28: qty 1

## 2022-08-28 MED ORDER — LABETALOL HCL 5 MG/ML IV SOLN: 10.0000 mg | INTRAVENOUS | Status: AC | PRN

## 2022-08-28 MED ORDER — ALTEPLASE 2 MG IJ SOLR
2.0000 mg | Freq: Once | INTRAMUSCULAR | Status: DC | PRN
  Filled 2022-08-28: qty 2

## 2022-08-28 MED ORDER — HEPARIN (PORCINE) IN NACL 1000-0.9 UT/500ML-% IV SOLN
INTRAVENOUS | Status: DC | PRN
  Administered 2022-08-28 (×2): 500 mL

## 2022-08-28 MED ORDER — FENTANYL CITRATE (PF) 100 MCG/2ML IJ SOLN
INTRAMUSCULAR | Status: DC | PRN
  Administered 2022-08-28 (×5): 25 ug via INTRAVENOUS

## 2022-08-28 MED ORDER — IOHEXOL 350 MG/ML SOLN
INTRAVENOUS | Status: DC | PRN
  Administered 2022-08-28: 150 mL

## 2022-08-28 MED ORDER — ACETAMINOPHEN 325 MG PO TABS: 650.0000 mg | ORAL_TABLET | ORAL | Status: DC | PRN

## 2022-08-28 MED ORDER — LIDOCAINE HCL (PF) 1 % IJ SOLN
5.0000 mL | INTRAMUSCULAR | Status: DC | PRN
  Filled 2022-08-28: qty 5

## 2022-08-28 MED ORDER — TICAGRELOR 90 MG PO TABS
ORAL_TABLET | ORAL | Status: AC
  Filled 2022-08-28: qty 2

## 2022-08-28 MED ORDER — HYDROMORPHONE HCL 1 MG/ML IJ SOLN: 1.0000 mg | INTRAMUSCULAR | Status: DC | PRN

## 2022-08-28 MED ORDER — ENSURE ENLIVE PO LIQD: 237.0000 mL | Freq: Two times a day (BID) | ORAL | Status: DC

## 2022-08-28 MED ORDER — LIDOCAINE-EPINEPHRINE 1 %-1:100000 IJ SOLN
INTRAMUSCULAR | Status: DC | PRN
  Administered 2022-08-28: 5 mL

## 2022-08-28 MED ORDER — HYDRALAZINE HCL 20 MG/ML IJ SOLN
INTRAMUSCULAR | Status: AC
  Filled 2022-08-28: qty 1

## 2022-08-28 MED ORDER — LIDOCAINE HCL (PF) 1 % IJ SOLN
INTRAMUSCULAR | Status: AC
  Filled 2022-08-28: qty 30

## 2022-08-28 MED ORDER — ANTICOAGULANT SODIUM CITRATE 4% (200MG/5ML) IV SOLN
5.0000 mL | Status: DC | PRN
  Filled 2022-08-28: qty 5

## 2022-08-28 MED ORDER — LIDOCAINE-EPINEPHRINE 1 %-1:100000 IJ SOLN
INTRAMUSCULAR | Status: AC
  Filled 2022-08-28: qty 1

## 2022-08-28 MED ORDER — SODIUM CHLORIDE 0.9% FLUSH: 3.0000 mL | INTRAVENOUS | Status: DC | PRN

## 2022-08-28 MED ORDER — MIDAZOLAM HCL 2 MG/2ML IJ SOLN
INTRAMUSCULAR | Status: DC | PRN
  Administered 2022-08-28 (×5): 1 mg via INTRAVENOUS

## 2022-08-28 MED ORDER — HYDRALAZINE HCL 20 MG/ML IJ SOLN: 10.0000 mg | INTRAMUSCULAR | Status: AC | PRN

## 2022-08-28 MED ORDER — HEPARIN SODIUM (PORCINE) 1000 UNIT/ML IJ SOLN
INTRAMUSCULAR | Status: AC
  Filled 2022-08-28: qty 10

## 2022-08-28 MED ORDER — TICAGRELOR 90 MG PO TABS
ORAL_TABLET | ORAL | Status: DC | PRN
  Administered 2022-08-28: 180 mg via ORAL

## 2022-08-28 MED ORDER — ASPIRIN 81 MG PO CHEW
81.0000 mg | CHEWABLE_TABLET | Freq: Every day | ORAL | Status: DC
  Filled 2022-08-28 (×2): qty 1

## 2022-08-28 MED ORDER — VERAPAMIL HCL 2.5 MG/ML IV SOLN
INTRAVENOUS | Status: AC
  Filled 2022-08-28: qty 2

## 2022-08-28 SURGICAL SUPPLY — 31 items
BALLN EMERGE MR 2.5X12 (BALLOONS) ×1
BALLN WOLVERINE 3.00X15 (BALLOONS) ×1
BALLN ~~LOC~~ EMERGE MR 3.5X15 (BALLOONS) ×1
BALLOON EMERGE MR 2.5X12 (BALLOONS) IMPLANT
BALLOON WOLVERINE 3.00X15 (BALLOONS) IMPLANT
BALLOON ~~LOC~~ EMERGE MR 3.5X15 (BALLOONS) IMPLANT
CATH DRAGONFLY OPSTAR (CATHETERS) IMPLANT
CATH INFINITI 5FR MULTPACK ANG (CATHETERS) IMPLANT
CATH TELEPORT (CATHETERS) IMPLANT
CATH TELESCOPE 6F GEC (CATHETERS) IMPLANT
CATH VISTA GUIDE 6FR XBLAD3.5 (CATHETERS) IMPLANT
CLOSURE PERCLOSE PROSTYLE (VASCULAR PRODUCTS) IMPLANT
CROWN DIAMONDBACK CLASSIC 1.25 (BURR) IMPLANT
DEVICE WIRE ANGIOSEAL 6FR (Vascular Products) IMPLANT
GUIDEWIRE PRESSURE X 175 (WIRE) IMPLANT
GUIDEWIRE VAS SION BLUE 190 (WIRE) IMPLANT
KIT ENCORE 26 ADVANTAGE (KITS) IMPLANT
KIT HEART LEFT (KITS) ×1 IMPLANT
KIT HEMO VALVE WATCHDOG (MISCELLANEOUS) IMPLANT
LUBRICANT VIPERSLIDE CORONARY (MISCELLANEOUS) IMPLANT
PACK CARDIAC CATHETERIZATION (CUSTOM PROCEDURE TRAY) ×1 IMPLANT
SHEATH PINNACLE 5F 10CM (SHEATH) IMPLANT
SHEATH PINNACLE 6F 10CM (SHEATH) IMPLANT
SHEATH PROBE COVER 6X72 (BAG) IMPLANT
STENT ONYX FRONTIER 3.0X34 (Permanent Stent) IMPLANT
TRANSDUCER W/STOPCOCK (MISCELLANEOUS) ×1 IMPLANT
TUBING CIL FLEX 10 FLL-RA (TUBING) ×1 IMPLANT
WIRE ASAHI PROWATER 300CM (WIRE) IMPLANT
WIRE EMERALD 3MM-J .035X150CM (WIRE) IMPLANT
WIRE MICRO SET SILHO 5FR 7 (SHEATH) IMPLANT
WIRE VIPERWIRE COR FLEX .012 (WIRE) IMPLANT

## 2022-08-28 NOTE — Plan of Care (Signed)

## 2022-08-28 NOTE — Progress Notes (Signed)
Brief cardiology note:  Post PCI, stable. If labs stable in AM anticipate discharge tomorrow.  Jodelle Red, MD, PhD, [facility] [place]  [facility]  [place]  Heart & Vascular at [facility] at [facility] [address] [place], [place] [address] [phone]

## 2022-08-28 NOTE — Progress Notes (Signed)
Heart Failure Navigator Progress Note  Assessed for Heart & Vascular TOC clinic readiness.  Patient does not meet criteria due to ESRD on hemodialysis.   Navigator will sign off at this time.    Cristin Penaflor, BSN, RN Heart Failure Nurse Navigator Secure Chat Only   

## 2022-08-28 NOTE — Progress Notes (Addendum)
Approximately 0820--Pt left to go down to cath lab for procedure. Pt transported in bed. Heparin gtt turned off by this RN prior to transport. Nitro gtt and NS gtt running well. Pt A&O x 4. This RN Danford Bad MD regarding pt's PO meds--directed to hold PO meds at this time d/t pt going down soon for cath. Telemetry notified. RN to assess pt promptly and administer scheduled meds upon return to floor.  Approximately 11:15--This RN notified by unit secretary that pt will not be returning back to his room on [address] per Cath Lab. Pt still in cath lab at this time. Currently, still waiting for room assignment for pt. This RN to call report to new unit once pt room assigned. Telemetry notified. All pt belongings packed up to be sent with pt to new room.

## 2022-08-28 NOTE — Progress Notes (Signed)
Initial Nutrition Assessment  DOCUMENTATION CODES:   Not applicable  INTERVENTION:  Encourage adequate PO intake Ensure Enlive po BID, each supplement provides 350 kcal and 20 grams of protein. Renal MVI with minerals daily  NUTRITION DIAGNOSIS:   Increased nutrient needs related to chronic illness, catabolic illness (ESRD requiring HD) as evidenced by estimated needs.  GOAL:   Patient will meet greater than or equal to 90% of their needs   MONITOR:   PO intake, Supplement acceptance, Labs, Weight trends, I & O's  REASON FOR ASSESSMENT:   Malnutrition Screening Tool    ASSESSMENT:   Pt presented with c/o CP associated with n/v, found to have hypertensive emergency. PMH significant for ESRD on HD, T2DM, HTN, HLD, anemia, recently admitted 4/9-4/16 d/t L hip fracture s/p L THA and was d/c to SNF.  Pt off units for LHC today. Next HD session today.   KUB findings of large stool burden on lactulose  No documented meal completions on file to review.   Unfortunately, there is limited documentation of weight history on file to review within the last year. Weight on 3/29 was 98.4 kg and current weight is 91.2 kg this is a weight loss of 7.3% which is clinically significant for time frame.    Medications: calcitriol, SSI 0-20 units TID, SSI 0-5 units qhs, SSI 3 units TID, semglee 10 units daily, miralax, senna  Labs: sodium 134, Cr 5.86, GFR 9, CBG's 117-144 x24 hours  UOP: 50ml x12 hours + x12 hours  NUTRITION - FOCUSED PHYSICAL EXAM: Pt off unit, deferred to follow up.   Diet Order:   Diet Order             Diet Heart Room service appropriate? Yes; Fluid consistency: Thin  Diet effective now                   EDUCATION NEEDS:   No education needs have been identified at this time  Skin:  Skin Assessment: Reviewed RN Assessment  Last BM:  5/16 (type 6)  Height:   Ht Readings from Last 1 Encounters:  08/27/22 5\' 11"  (1.803 m)    Weight:   Wt  Readings from Last 1 Encounters:  08/28/22 91.2 kg   BMI:  Body mass index is 28.04 kg/m.  Estimated Nutritional Needs:   Kcal:  2100-2300  Protein:  110-125g  Fluid:  1L + UOP  Drusilla Kanner, RDN, LDN Clinical Nutrition

## 2022-08-28 NOTE — Progress Notes (Signed)
ANTICOAGULATION CONSULT NOTE   Pharmacy Consult for heparin Indication: chest pain/ACS  No Known Allergies  Patient Measurements: Height: 5\' 11"  (180.3 cm) Weight: 91.8 kg (202 lb 6.1 oz) IBW/kg (Calculated) : 75.3 Heparin Dosing Weight: 91 kg  Vital Signs: Temp: 98.6 F (37 C) (05/17 0000) Temp Source: Oral (05/17 0000) BP: 156/82 (05/17 0000) Pulse Rate: 85 (05/17 0000)  Labs: Recent Labs    08/27/22 0851 08/27/22 1106 08/27/22 1441 08/27/22 2237 08/28/22 0100  HGB 9.3*  --   --   --  8.6*  HCT 30.3*  --   --   --  27.1*  PLT 236  --   --   --  256  HEPARINUNFRC  --   --   --  0.34 0.22*  CREATININE 4.89*  --   --   --   --   TROPONINIHS 82* 216* 722*  --   --     Estimated Creatinine Clearance: 13.7 mL/min (A) (by C-G formula based on SCr of 4.89 mg/dL (H)).  Medical History: Past Medical History:  Diagnosis Date   Anemia    Chronic kidney disease    Stage 4?    Diabetes mellitus without complication (HCC)    Hepatitis    not sure what type - over 20 years ago   History of kidney stones    Hyperlipidemia    Hypertension    Medications:  Medications Prior to Admission  Medication Sig Dispense Refill Last Dose   acetaminophen (TYLENOL) 325 MG tablet Take 1-2 tablets (325-650 mg total) by mouth every 6 (six) hours as needed for mild pain (pain score 1-3 or temp > 100.5).   unknown   acetaminophen (TYLENOL) 500 MG tablet Take 1,000 mg by mouth every 6 (six) hours as needed for moderate pain.   08/26/2022   carvedilol (COREG) 12.5 MG tablet Take 12.5 mg by mouth 2 (two) times daily with a meal.   08/26/2022 at 2100   Cholecalciferol (VITAMIN D-3 PO) Take by mouth.   08/26/2022   CRANBERRY EXTRACT PO Take by mouth.   08/26/2022   hydrALAZINE (APRESOLINE) 100 MG tablet Take 50 mg by mouth 2 (two) times daily.   08/26/2022   insulin degludec (TRESIBA FLEXTOUCH) 200 UNIT/ML FlexTouch Pen 10-24 Units as needed (blood sugar). Sliding scale Under 100 Do Not Take    unknown   lactulose (CHRONULAC) 10 GM/15ML solution Take 20 g by mouth daily as needed for moderate constipation.   08/26/2022   OVER THE COUNTER MEDICATION Stem Cell Capsules   08/26/2022   polyethylene glycol (MIRALAX / GLYCOLAX) 17 g packet Take 17 g by mouth daily as needed for mild constipation. 14 each 0 unknown   rosuvastatin (CRESTOR) 10 MG tablet Take 10 mg by mouth every evening.   08/26/2022   senna (SENOKOT) 8.6 MG TABS tablet Take 1 tablet (8.6 mg total) by mouth 2 (two) times daily. 120 tablet 0 unknown   ACCU-CHEK AVIVA PLUS test strip USE TO TEST BLOOD SUGAR BEFORE BREAKFAST AND AT BEDTIME 200 strip 2    Accu-Chek Softclix Lancets lancets       Alcohol Swabs (B-D SINGLE USE SWABS REGULAR) PADS       Blood Glucose Monitoring Suppl (ONETOUCH VERIO) w/Device KIT 1 each by Does not apply route as needed. 1 kit 0    ondansetron (ZOFRAN) 4 MG tablet Take 1 tablet (4 mg total) by mouth every 6 (six) hours as needed for nausea. (Patient not taking: Reported  on 08/27/2022) 20 tablet 0 Not Taking   oxyCODONE (OXY IR/ROXICODONE) 5 MG immediate release tablet Take 1-2 tablets (5-10 mg total) by mouth every 4 (four) hours as needed for moderate pain (pain score 4-6). (Patient not taking: Reported on 08/27/2022) 5 tablet 0 Completed Course    Assessment: Pharmacy consulted to dose heparin in patient with chest pain/ACS.  Patient is not on anticoagulation prior to admission.    Hgb 9.3 Trop 216  5/17 AM update:  Heparin level sub-therapeutic   Goal of Therapy:  Heparin level 0.3-0.7 units/ml Monitor platelets by anticoagulation protocol: Yes   Plan:  Heparin 2000 units bolus Inc heparin to 1350 units/hr Heparin level in 8 hours  Abran Duke, PharmD, BCPS Clinical Pharmacist Phone: [phone]

## 2022-08-28 NOTE — Progress Notes (Signed)
[facility] Progress Note   Assessment/ Plan:   1. NSTEMI: s/p atherectomy and DES x 1.  On nitro gtt 2. ESRD: MWF [facility], orders for today 3. Anemia: Hgb 8.6, ESA when appropriate 4. 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 Nutrition:renal diet with fluid restriction 6. Hypertension: on nitro gtt   Subjective:    Seen in room.  S/p cardiac cath with orbital atherectomy and DES.  Groin site oozing, RN at bedside   Objective:   BP (!) 172/77   Pulse 85   Temp 98.5 F (36.9 C) (Oral)   Resp (!) 23   Ht 5\' 11"  (1.803 m)   Wt 91.2 kg   SpO2 99%   BMI 28.04 kg/m   Physical Exam: ZOX:WRUEA in bed CVS: RRR Resp: clear GU: R groin site with some oozing Abd: soft Ext: no LE edema ACCESS: + AVF L side  Labs: BMET Recent Labs  Lab 08/27/22 0851 08/28/22 0100  NA 135 134*  K 4.3 3.6  CL 100 100  CO2 25 23  GLUCOSE 142* 160*  BUN 21 22  CREATININE 4.89* 5.86*  CALCIUM 9.0 8.7*   CBC Recent Labs  Lab 08/27/22 0851 08/28/22 0100  WBC 10.2 12.2*  NEUTROABS 8.1*  --   HGB 9.3* 8.6*  HCT 30.3* 27.1*  MCV 100.7* 96.4  PLT 236 256      Medications:     amLODipine  5 mg Oral Daily   [START ON 08/29/2022] aspirin  81 mg Oral Daily   aspirin EC  81 mg Oral Q breakfast   calcitRIOL  1.25 mcg Oral Q M,W,F-HD   carvedilol  12.5 mg Oral BID WC   Chlorhexidine Gluconate Cloth  6 each Topical Q0600   feeding supplement  237 mL Oral BID BM   hydrALAZINE  100 mg Oral TID   insulin aspart  0-20 Units Subcutaneous TID WC   insulin aspart  0-5 Units Subcutaneous QHS   insulin aspart  3 Units Subcutaneous TID WC   insulin glargine-yfgn  10 Units Subcutaneous Daily   multivitamin  1 tablet Oral QHS   polyethylene glycol  17 g Oral Daily   rosuvastatin  10 mg Oral QPM   senna-docusate  2 tablet Oral BID   sodium chloride flush  3 mL Intravenous Q12H   sodium chloride flush  3 mL Intravenous Q12H   sodium chloride flush  3 mL Intravenous Q12H   sodium  chloride flush  3 mL Intravenous Q12H   ticagrelor  90 mg Oral BID     Bufford Buttner, MD 08/28/2022, 3:53 PM

## 2022-08-28 NOTE — Progress Notes (Signed)
Charge nurse notified after nurse and PCT in to find patient nauseated and groin site bleeding. Blood clots found around site, bed saturated with blood. Patient complaints of chest pain, increased Nitro drip. Cath lab notified/physician notified. Patient laid flat, pressure applied.

## 2022-08-28 NOTE — Interval H&P Note (Signed)
History and Physical Interval Note:  08/28/2022 8:48 AM  Trevor [name]  has presented today for surgery, with the diagnosis of chest pain.  The various methods of treatment have been discussed with the patient and family. After consideration of risks, benefits and other options for treatment, the patient has consented to  Procedure(s): LEFT HEART CATH AND CORONARY ANGIOGRAPHY (N/A) as a surgical intervention.  The patient's history has been reviewed, patient examined, no change in status, stable for surgery.  I have reviewed the patient's chart and labs.  Questions were answered to the patient's satisfaction.     Orbie Pyo

## 2022-08-28 NOTE — Progress Notes (Signed)
PROGRESS NOTE    Trevor [name]  WUJ:[id number] DOB: Jun 05, 1940 DOA: 08/27/2022 PCP: Trevor [name]   Brief Narrative:  82 y.o. male with PMHx relevant for ESRD, on HD MWF, DM2, HTN, HLD, anemia of chronic kidney disease presents to the ED with complaints of chest pain associated with nausea and vomiting.  Patient was started on heparin drip and nitro drip in the ER.  Troponins was trending up from 82-2 16 and 722 associated with chest pains.  Patient was transferred to [facility] from [facility] for cardiac cath.   Assessment & Plan:   Principal Problem:   Hypertensive emergency Active Problems:   Hypertensive heart disease with end stage renal disease (HCC)   Chest pain in adult   Diabetes mellitus with stage 5 chronic kidney disease (HCC)   End stage renal disease (HCC)   Anemia   Gastroesophageal reflux disease   Acute exacerbation of CHF (congestive heart failure) (HCC)   #1 atypical chest pain with elevated troponin in the setting of ESRD on hemodialysis Monday Wednesday and Friday.  Patient was transferred to [facility] from [facility] for cath.  Patient admitted with complaints of chest pain.  EKG showed no acute ST-T wave changes. Troponin peaked at 722. Echocardiogram normal ejection fraction He has been pain free this morning  for cath today On aspirin and Crestor  #2 hypertensive urgency-presented with chest pain  Chest x-ray showed volume overload findings  Continue hydralazine and Coreg Initial blood pressure was 222/132 was started on nitro drip Continue coreg and hydralazine   #3 end-stage renal disease on dialysis Monday Wednesday and Friday  #4 constipation KUB shows large stool burden on lactulose  #5 recent hip replacement was on aspirin twice a day for DVT prophylaxis   #6 type 2 diabetes on Lantus 10 units daily A1c 5.7  #7 anemia of chronic renal disease/iron deficiency anemia on iron replacement at home  #8 diastolic heart failure elevated BNP and  chest x-ray findings consistent with fluid overload.  He had dialysis on Wednesday.  He will have his next dialysis session today. He reports he still makes some urine.           Estimated body mass index is 28.04 kg/m as calculated from the following:   Height as of this encounter: 5\' 11"  (1.803 m).   Weight as of this encounter: 91.2 kg.  DVT prophylaxis: Heparin  code Status: Full code Family Communication: None at bed side  disposition Plan:  Status is: Inpatient Remains inpatient appropriate because: Awaiting cath   Consultants: Cardiology  Procedures: None Antimicrobials: None  Subjective: Resting in bed denies any further chest pain this morning anxious to have the procedure feels breathing is okay  Objective: Vitals:   08/28/22 0621 08/28/22 0749 08/28/22 0819 08/28/22 0836  BP:  (!) 170/78 (!) 175/77   Pulse:  71 72   Resp:  18    Temp:  98.5 F (36.9 C)    TempSrc:  Oral    SpO2:  99%  100%  Weight: 91.2 kg     Height:        Intake/Output Summary (Last 24 hours) at 08/28/2022 1011 Last data filed at 08/28/2022 0725 Gross per 24 hour  Intake 302.27 ml  Output 150 ml  Net 152.27 ml   Filed Weights   08/27/22 1152 08/28/22 0500 08/28/22 0621  Weight: 91.8 kg 91.2 kg 91.2 kg    Examination:  General exam: Appears calm and comfortable  Respiratory system: Diminished to auscultation. Respiratory effort normal. Cardiovascular system: S1 & S2 heard, RRR. No JVD, murmurs, rubs, gallops or clicks. No pedal edema. Gastrointestinal system: Abdomen is nondistended, soft and nontender. No organomegaly or masses felt. Normal bowel sounds heard. Central nervous system: Alert and oriented. No focal neurological deficits. Extremities: Left upper extremity graft and ordered for dialysis  skin: No rashes, lesions or ulcers Psychiatry: Judgement and insight appear normal. Mood & affect appropriate.     Data Reviewed: I have personally reviewed following labs  and imaging studies  CBC: Recent Labs  Lab 08/27/22 0851 08/28/22 0100  WBC 10.2 12.2*  NEUTROABS 8.1*  --   HGB 9.3* 8.6*  HCT 30.3* 27.1*  MCV 100.7* 96.4  PLT 236 256   Basic Metabolic Panel: Recent Labs  Lab 08/27/22 0851 08/28/22 0100  NA 135 134*  K 4.3 3.6  CL 100 100  CO2 25 23  GLUCOSE 142* 160*  BUN 21 22  CREATININE 4.89* 5.86*  CALCIUM 9.0 8.7*   GFR: Estimated Creatinine Clearance: 11.4 mL/min (A) (by C-G formula based on SCr of 5.86 mg/dL (H)). Liver Function Tests: Recent Labs  Lab 08/27/22 0851 08/28/22 0100  AST 23 34  ALT 15 13  ALKPHOS 50 43  BILITOT 0.8 0.7  PROT 7.5 6.6  ALBUMIN 3.7 3.2*   Recent Labs  Lab 08/27/22 0851  LIPASE 44   No results for input(s): "AMMONIA" in the last 168 hours. Coagulation Profile: No results for input(s): "INR", "PROTIME" in the last 168 hours. Cardiac Enzymes: No results for input(s): "CKTOTAL", "CKMB", "CKMBINDEX", "TROPONINI" in the last 168 hours. BNP (last 3 results) No results for input(s): "PROBNP" in the last 8760 hours. HbA1C: No results for input(s): "HGBA1C" in the last 72 hours. CBG: Recent Labs  Lab 08/27/22 1800 08/27/22 2125 08/28/22 0612  GLUCAP 129* 117* 123*   Lipid Profile: No results for input(s): "CHOL", "HDL", "LDLCALC", "TRIG", "CHOLHDL", "LDLDIRECT" in the last 72 hours. Thyroid Function Tests: No results for input(s): "TSH", "T4TOTAL", "FREET4", "T3FREE", "THYROIDAB" in the last 72 hours. Anemia Panel: No results for input(s): "VITAMINB12", "FOLATE", "FERRITIN", "TIBC", "IRON", "RETICCTPCT" in the last 72 hours. Sepsis Labs: No results for input(s): "PROCALCITON", "LATICACIDVEN" in the last 168 hours.  No results found for this or any previous visit (from the past 240 hour(s)).       Radiology Studies: ECHOCARDIOGRAM COMPLETE  Result Date: 08/27/2022    ECHOCARDIOGRAM REPORT   Patient Name:   Trevor [name] [facility] Date of Exam: 08/27/2022 Medical Rec #:  [id number]         Height:       71.0 in Accession #:    [id number]       Weight:       202.4 lb Date of Birth:  April 12, 1941        BSA:          2.119 m Patient Age:    82 years         BP:           198/95 mmHg Patient Gender: M                HR:           70 bpm. [facility]am [facility] Procedure: 2D Echo, Cardiac Doppler, Color Doppler and Intracardiac            Opacification Agent Indications:    Chest Pain R07.9  History:  Patient has no prior history of Echocardiogram examinations.                 Signs/Symptoms:Chest Pain; Risk Factors:Diabetes, Non-Smoker,                 Hypertension and Dyslipidemia.  Sonographer:    Aron Baba Referring Phys: [id number] Ellsworth Lennox  Sonographer Comments: Image acquisition challenging due to patient body habitus. IMPRESSIONS  1. Left ventricular ejection fraction, by estimation, is >75%. The left ventricle has hyperdynamic function. The left ventricle has no regional wall motion abnormalities. There is moderate left ventricular hypertrophy. Left ventricular diastolic parameters were normal.  2. Right ventricular systolic function was not well visualized. The right ventricular size is mildly enlarged. There is normal pulmonary artery systolic pressure.  3. Left atrial size was mildly dilated.  4. The mitral valve is normal in structure. No evidence of mitral valve regurgitation. No evidence of mitral stenosis.  5. The aortic valve is tricuspid. Aortic valve regurgitation is not visualized. No aortic stenosis is present.  6. The inferior vena cava is normal in size with greater than 50% respiratory variability, suggesting right atrial pressure of 3 mmHg. Comparison(s): No prior Echocardiogram. FINDINGS  Left Ventricle: Left ventricular ejection fraction, by estimation, is >75%. The left ventricle has hyperdynamic function. The left ventricle has no regional wall motion abnormalities. Definity contrast agent was given IV to delineate the left ventricular endocardial  borders. The left ventricular internal cavity size was normal in size. There is moderate left ventricular hypertrophy. Left ventricular diastolic parameters were normal. Right Ventricle: The right ventricular size is mildly enlarged. No increase in right ventricular wall thickness. Right ventricular systolic function was not well visualized. There is normal pulmonary artery systolic pressure. The tricuspid regurgitant velocity is 1.46 m/s, and with an assumed right atrial pressure of 3 mmHg, the estimated right ventricular systolic pressure is 11.5 mmHg. Left Atrium: Left atrial size was mildly dilated. Right Atrium: Right atrial size was normal in size. Pericardium: There is no evidence of pericardial effusion. Mitral Valve: The mitral valve is normal in structure. No evidence of mitral valve regurgitation. No evidence of mitral valve stenosis. MV peak gradient, 8.3 mmHg. The mean mitral valve gradient is 5.0 mmHg. Tricuspid Valve: The tricuspid valve is normal in structure. Tricuspid valve regurgitation is trivial. No evidence of tricuspid stenosis. Aortic Valve: The aortic valve is tricuspid. Aortic valve regurgitation is not visualized. No aortic stenosis is present. Pulmonic Valve: The pulmonic valve was not well visualized. Pulmonic valve regurgitation is trivial. No evidence of pulmonic stenosis. Aorta: The aortic root is normal in size and structure. Venous: The inferior vena cava is normal in size with greater than 50% respiratory variability, suggesting right atrial pressure of 3 mmHg. IAS/Shunts: No atrial level shunt detected by color flow Doppler.  LEFT VENTRICLE PLAX 2D LVIDd:         4.00 cm   Diastology LVIDs:         2.10 cm   LV e' medial:    6.67 cm/s LV PW:         1.30 cm   LV E/e' medial:  12.1 LV IVS:        1.20 cm   LV e' lateral:   8.36 cm/s LVOT diam:     1.90 cm   LV E/e' lateral: 9.7 LV SV:         56 LV SV Index:   27 LVOT Area:  2.84 cm  LEFT ATRIUM             Index        RIGHT  ATRIUM           Index LA diam:        3.90 cm 1.84 cm/m   RA Area:     16.30 cm LA Vol (A2C):   86.0 ml 40.58 ml/m  RA Volume:   49.60 ml  23.41 ml/m LA Vol (A4C):   73.7 ml 34.78 ml/m LA Biplane Vol: 80.0 ml 37.75 ml/m  AORTIC VALVE             PULMONIC VALVE LVOT Vmax:   84.30 cm/s  PR End Diast Vel: 5.76 msec LVOT Vmean:  58.600 cm/s LVOT VTI:    0.199 m  AORTA Ao Root diam: 3.50 cm MITRAL VALVE               TRICUSPID VALVE MV Area (PHT): 4.21 cm    TR Peak grad:   8.5 mmHg MV Area VTI:   2.17 cm    TR Vmax:        146.00 cm/s MV Peak grad:  8.3 mmHg MV Mean grad:  5.0 mmHg    SHUNTS MV Vmax:       1.44 m/s    Systemic VTI:  0.20 m MV Vmean:      104.0 cm/s  Systemic Diam: 1.90 cm MV Decel Time: 180 msec MV E velocity: 80.80 cm/s MV A velocity: 89.40 cm/s MV E/A ratio:  0.90 Vishnu Priya Mallipeddi Electronically signed by Winfield Rast Mallipeddi Signature Date/Time: 08/27/2022/12:54:10 PM    Final    DG ABD ACUTE 2+V W 1V CHEST  Result Date: 08/27/2022 CLINICAL DATA:  Pain EXAM: DG ABDOMEN ACUTE WITH 1 VIEW CHEST COMPARISON:  CXR 10/23/20 FINDINGS: No pleural effusion. No pneumothorax. Normal cardiac and mediastinal contours. There are prominent bilateral interstitial opacities could represent pulmonary venous congestion or atypical infection. Nonobstructive bowel gas pattern. Large colonic stool burden. No pneumatosis. No supine evidence of pneumoperitoneum. Bilateral hip arthroplasties. Degenerative changes in the lower lumbar spine. IMPRESSION: 1. Prominent bilateral interstitial opacities could represent pulmonary venous congestion or atypical infection. 2. Nonobstructive bowel gas pattern. Large colonic stool burden. Electronically Signed   By: Lorenza Cambridge M.D.   On: 08/27/2022 07:53        Scheduled Meds:  [MAR Hold] amLODipine  5 mg Oral Daily   [MAR Hold] aspirin EC  81 mg Oral Q breakfast   [MAR Hold] calcitRIOL  1.25 mcg Oral Q M,W,F-HD   [MAR Hold] carvedilol  12.5 mg Oral BID  WC   [MAR Hold] Chlorhexidine Gluconate Cloth  6 each Topical Q0600   [MAR Hold] hydrALAZINE  100 mg Oral TID   [MAR Hold] insulin aspart  0-20 Units Subcutaneous TID WC   [MAR Hold] insulin aspart  0-5 Units Subcutaneous QHS   [MAR Hold] insulin aspart  3 Units Subcutaneous TID WC   [MAR Hold] insulin glargine-yfgn  10 Units Subcutaneous Daily   [MAR Hold] polyethylene glycol  17 g Oral Daily   [MAR Hold] rosuvastatin  10 mg Oral QPM   [MAR Hold] senna-docusate  2 tablet Oral BID   [MAR Hold] sodium chloride flush  3 mL Intravenous Q12H   [MAR Hold] sodium chloride flush  3 mL Intravenous Q12H   [MAR Hold] sodium chloride flush  3 mL Intravenous Q12H   Continuous Infusions:  sodium chloride  sodium chloride 10 mL/hr at 08/28/22 0618   [MAR Hold] sodium chloride     heparin Stopped (08/28/22 0818)   [MAR Hold] nitroGLYCERIN 40 mcg/min (08/28/22 0926)     LOS: 1 day    Time spent: 39 min  Alwyn Ren, [name] 08/28/2022, 10:11 AM

## 2022-08-29 ENCOUNTER — Other Ambulatory Visit (HOSPITAL_COMMUNITY): Payer: Self-pay

## 2022-08-29 DIAGNOSIS — I161 Hypertensive emergency: Secondary | ICD-10-CM | POA: Diagnosis not present

## 2022-08-29 LAB — COMPREHENSIVE METABOLIC PANEL
ALT: 14 U/L (ref 0–44)
AST: 34 U/L (ref 15–41)
Albumin: 3.5 g/dL (ref 3.5–5.0)
Alkaline Phosphatase: 50 U/L (ref 38–126)
Anion gap: 13 (ref 5–15)
BUN: 9 mg/dL (ref 8–23)
CO2: 26 mmol/L (ref 22–32)
Calcium: 9.1 mg/dL (ref 8.9–10.3)
Chloride: 95 mmol/L — ABNORMAL LOW (ref 98–111)
Creatinine, Ser: 3.39 mg/dL — ABNORMAL HIGH (ref 0.61–1.24)
GFR, Estimated: 17 mL/min — ABNORMAL LOW (ref >60)
Glucose, Bld: 130 mg/dL — ABNORMAL HIGH (ref 70–99)
Potassium: 3.3 mmol/L — ABNORMAL LOW (ref 3.5–5.1)
Sodium: 134 mmol/L — ABNORMAL LOW (ref 135–145)
Total Bilirubin: 1.1 mg/dL (ref 0.3–1.2)
Total Protein: 7.6 g/dL (ref 6.5–8.1)

## 2022-08-29 LAB — CBC
HCT: 29.2 % — ABNORMAL LOW (ref 39.0–52.0)
Hemoglobin: 9.6 g/dL — ABNORMAL LOW (ref 13.0–17.0)
MCH: 30.1 pg (ref 26.0–34.0)
MCHC: 32.9 g/dL (ref 30.0–36.0)
MCV: 91.5 fL (ref 80.0–100.0)
Platelets: 274 10*3/uL (ref 150–400)
RBC: 3.19 MIL/uL — ABNORMAL LOW (ref 4.22–5.81)
RDW: 15.5 % (ref 11.5–15.5)
WBC: 16 10*3/uL — ABNORMAL HIGH (ref 4.0–10.5)
nRBC: 0 % (ref 0.0–0.2)

## 2022-08-29 LAB — MRSA NEXT GEN BY PCR, NASAL: MRSA by PCR Next Gen: NOT DETECTED

## 2022-08-29 LAB — GLUCOSE, CAPILLARY: Glucose-Capillary: 137 mg/dL — ABNORMAL HIGH (ref 70–99)

## 2022-08-29 MED ORDER — NITROGLYCERIN 0.4 MG SL SUBL: 0.4000 mg | SUBLINGUAL_TABLET | SUBLINGUAL | 3 refills | Status: DC | PRN

## 2022-08-29 MED ORDER — ASPIRIN 81 MG PO TBEC: 81.0000 mg | DELAYED_RELEASE_TABLET | Freq: Every day | ORAL | 12 refills | Status: AC

## 2022-08-29 MED ORDER — NITROGLYCERIN 0.4 MG SL SUBL
0.4000 mg | SUBLINGUAL_TABLET | SUBLINGUAL | 3 refills | Status: AC | PRN
  Filled 2022-08-29: qty 25, 7d supply, fill #0

## 2022-08-29 MED ORDER — TICAGRELOR 90 MG PO TABS
90.0000 mg | ORAL_TABLET | Freq: Two times a day (BID) | ORAL | 11 refills | Status: DC
  Filled 2022-08-29: qty 60, 30d supply, fill #0

## 2022-08-29 MED ORDER — AMLODIPINE BESYLATE 5 MG PO TABS
5.0000 mg | ORAL_TABLET | Freq: Every day | ORAL | 2 refills | Status: DC
  Filled 2022-08-29: qty 30, 30d supply, fill #0

## 2022-08-29 MED ORDER — TICAGRELOR 90 MG PO TABS: 90.0000 mg | ORAL_TABLET | Freq: Two times a day (BID) | ORAL | 11 refills | Status: DC

## 2022-08-29 NOTE — Progress Notes (Signed)
CARDIAC REHAB PHASE I   PRE:  Rate/Rhythm: 84 SR    SaO2: 100% RA  [phone] Patient declined ambulation at this time stating he doesn't feel like walking, recently had hip replacement last month. Encouraged to walk with nursing staff if he feels better later. Completed PCI education including restrictions, Brilinta use, CP, NTG use, and calling 911, risk factor modification and activity progression. Diabetic and heart healthy diet handouts given with instruction to follow already established kidney diet where there's conflict. Discussed activity progression, and patient is amenable. Pt is tired on dialysis days, so patient will progress as tolerated focusing on walking on non-dialysis days. Discussed phase 2 cardiac rehab, and pt is interested in the program at [facility], referral sent.  Artist Pais, MS, ACSM CEP

## 2022-08-29 NOTE — Plan of Care (Signed)

## 2022-08-29 NOTE — Plan of Care (Signed)
Problem: Education: Goal: Knowledge of General Education information will improve Description: Including pain rating scale, medication(s)/side effects and non-pharmacologic comfort measures 08/29/2022 0937 by Herma Carson, RN Outcome: Adequate for Discharge 08/29/2022 0804 by Herma Carson, RN Outcome: Progressing   Problem: Health Behavior/Discharge Planning: Goal: Ability to manage health-related needs will improve 08/29/2022 0937 by Herma Carson, RN Outcome: Adequate for Discharge 08/29/2022 0804 by Herma Carson, RN Outcome: Progressing   Problem: Clinical Measurements: Goal: Ability to maintain clinical measurements within normal limits will improve 08/29/2022 0937 by Herma Carson, RN Outcome: Adequate for Discharge 08/29/2022 0804 by Herma Carson, RN Outcome: Progressing Goal: Will remain free from infection 08/29/2022 0937 by Herma Carson, RN Outcome: Adequate for Discharge 08/29/2022 0804 by Herma Carson, RN Outcome: Progressing Goal: Diagnostic test results will improve 08/29/2022 0937 by Herma Carson, RN Outcome: Adequate for Discharge 08/29/2022 0804 by Herma Carson, RN Outcome: Progressing Goal: Respiratory complications will improve 08/29/2022 0937 by Herma Carson, RN Outcome: Adequate for Discharge 08/29/2022 0804 by Herma Carson, RN Outcome: Progressing Goal: Cardiovascular complication will be avoided 08/29/2022 0937 by Herma Carson, RN Outcome: Adequate for Discharge 08/29/2022 0804 by Herma Carson, RN Outcome: Progressing   Problem: Activity: Goal: Risk for activity intolerance will decrease 08/29/2022 0937 by Herma Carson, RN Outcome: Adequate for Discharge 08/29/2022 0804 by Herma Carson, RN Outcome: Progressing   Problem: Nutrition: Goal: Adequate nutrition will be maintained 08/29/2022 0937 by Herma Carson, RN Outcome: Adequate for Discharge 08/29/2022 0804 by Herma Carson, RN Outcome: Progressing    Problem: Coping: Goal: Level of anxiety will decrease 08/29/2022 0937 by Herma Carson, RN Outcome: Adequate for Discharge 08/29/2022 0804 by Herma Carson, RN Outcome: Progressing   Problem: Elimination: Goal: Will not experience complications related to bowel motility 08/29/2022 0937 by Herma Carson, RN Outcome: Adequate for Discharge 08/29/2022 0804 by Herma Carson, RN Outcome: Progressing Goal: Will not experience complications related to urinary retention 08/29/2022 0937 by Herma Carson, RN Outcome: Adequate for Discharge 08/29/2022 0804 by Herma Carson, RN Outcome: Progressing   Problem: Pain Managment: Goal: General experience of comfort will improve 08/29/2022 0937 by Herma Carson, RN Outcome: Adequate for Discharge 08/29/2022 0804 by Herma Carson, RN Outcome: Progressing   Problem: Safety: Goal: Ability to remain free from injury will improve 08/29/2022 0937 by Herma Carson, RN Outcome: Adequate for Discharge 08/29/2022 0804 by Herma Carson, RN Outcome: Progressing   Problem: Skin Integrity: Goal: Risk for impaired skin integrity will decrease 08/29/2022 0937 by Herma Carson, RN Outcome: Adequate for Discharge 08/29/2022 0804 by Herma Carson, RN Outcome: Progressing   Problem: Education: Goal: Understanding of CV disease, CV risk reduction, and recovery process will improve 08/29/2022 0937 by Herma Carson, RN Outcome: Adequate for Discharge 08/29/2022 0804 by Herma Carson, RN Outcome: Progressing Goal: Individualized Educational Video(s) 08/29/2022 0937 by Herma Carson, RN Outcome: Adequate for Discharge 08/29/2022 0804 by Herma Carson, RN Outcome: Progressing   Problem: Activity: Goal: Ability to return to baseline activity level will improve 08/29/2022 0937 by Herma Carson, RN Outcome: Adequate for Discharge 08/29/2022 0804 by Herma Carson, RN Outcome: Progressing   Problem: Cardiovascular: Goal: Ability to  achieve and maintain adequate cardiovascular perfusion will improve 08/29/2022 0937 by Herma Carson, RN Outcome: Adequate for Discharge 08/29/2022 0804 by Herma Carson, RN Outcome: Progressing Goal: Vascular access  site(s) Level 0-1 will be maintained 08/29/2022 0937 by Herma Carson, RN Outcome: Adequate for Discharge 08/29/2022 0804 by Herma Carson, RN Outcome: Progressing   Problem: Health Behavior/Discharge Planning: Goal: Ability to safely manage health-related needs after discharge will improve 08/29/2022 0937 by Herma Carson, RN Outcome: Adequate for Discharge 08/29/2022 0804 by Herma Carson, RN Outcome: Progressing   Problem: Education: Goal: Ability to describe self-care measures that may prevent or decrease complications (Diabetes Survival Skills Education) will improve 08/29/2022 0937 by Herma Carson, RN Outcome: Adequate for Discharge 08/29/2022 0804 by Herma Carson, RN Outcome: Progressing Goal: Individualized Educational Video(s) 08/29/2022 0937 by Herma Carson, RN Outcome: Adequate for Discharge 08/29/2022 0804 by Herma Carson, RN Outcome: Progressing   Problem: Coping: Goal: Ability to adjust to condition or change in health will improve 08/29/2022 0937 by Herma Carson, RN Outcome: Adequate for Discharge 08/29/2022 0804 by Herma Carson, RN Outcome: Progressing   Problem: Fluid Volume: Goal: Ability to maintain a balanced intake and output will improve 08/29/2022 0937 by Herma Carson, RN Outcome: Adequate for Discharge 08/29/2022 0804 by Herma Carson, RN Outcome: Progressing   Problem: Health Behavior/Discharge Planning: Goal: Ability to identify and utilize available resources and services will improve 08/29/2022 0937 by Herma Carson, RN Outcome: Adequate for Discharge 08/29/2022 0804 by Herma Carson, RN Outcome: Progressing Goal: Ability to manage health-related needs will improve 08/29/2022 0937 by Herma Carson,  RN Outcome: Adequate for Discharge 08/29/2022 0804 by Herma Carson, RN Outcome: Progressing   Problem: Metabolic: Goal: Ability to maintain appropriate glucose levels will improve 08/29/2022 0937 by Herma Carson, RN Outcome: Adequate for Discharge 08/29/2022 0804 by Herma Carson, RN Outcome: Progressing   Problem: Nutritional: Goal: Maintenance of adequate nutrition will improve 08/29/2022 0937 by Herma Carson, RN Outcome: Adequate for Discharge 08/29/2022 0804 by Herma Carson, RN Outcome: Progressing Goal: Progress toward achieving an optimal weight will improve 08/29/2022 0937 by Herma Carson, RN Outcome: Adequate for Discharge 08/29/2022 0804 by Herma Carson, RN Outcome: Progressing   Problem: Skin Integrity: Goal: Risk for impaired skin integrity will decrease 08/29/2022 0937 by Herma Carson, RN Outcome: Adequate for Discharge 08/29/2022 0804 by Herma Carson, RN Outcome: Progressing   Problem: Tissue Perfusion: Goal: Adequacy of tissue perfusion will improve 08/29/2022 0937 by Herma Carson, RN Outcome: Adequate for Discharge 08/29/2022 0804 by Herma Carson, RN Outcome: Progressing

## 2022-08-29 NOTE — Plan of Care (Signed)
Problem: Education: Goal: Knowledge of General Education information will improve Description: Including pain rating scale, medication(s)/side effects and non-pharmacologic comfort measures Outcome: Progressing   Problem: Health Behavior/Discharge Planning: Goal: Ability to manage health-related needs will improve Outcome: Progressing   Problem: Clinical Measurements: Goal: Ability to maintain clinical measurements within normal limits will improve Outcome: Progressing Goal: Will remain free from infection Outcome: Progressing Goal: Diagnostic test results will improve Outcome: Progressing Goal: Respiratory complications will improve Outcome: Progressing Goal: Cardiovascular complication will be avoided Outcome: Progressing   Problem: Activity: Goal: Risk for activity intolerance will decrease Outcome: Progressing   Problem: Nutrition: Goal: Adequate nutrition will be maintained Outcome: Progressing   Problem: Coping: Goal: Level of anxiety will decrease Outcome: Progressing   Problem: Elimination: Goal: Will not experience complications related to bowel motility Outcome: Progressing Goal: Will not experience complications related to urinary retention Outcome: Progressing   Problem: Pain Managment: Goal: General experience of comfort will improve Outcome: Progressing   Problem: Safety: Goal: Ability to remain free from injury will improve Outcome: Progressing   Problem: Skin Integrity: Goal: Risk for impaired skin integrity will decrease Outcome: Progressing   Problem: Education: Goal: Understanding of CV disease, CV risk reduction, and recovery process will improve Outcome: Progressing Goal: Individualized Educational Video(s) Outcome: Progressing   Problem: Activity: Goal: Ability to return to baseline activity level will improve Outcome: Progressing   Problem: Cardiovascular: Goal: Ability to achieve and maintain adequate cardiovascular perfusion  will improve Outcome: Progressing Goal: Vascular access site(s) Level 0-1 will be maintained Outcome: Progressing   Problem: Health Behavior/Discharge Planning: Goal: Ability to safely manage health-related needs after discharge will improve Outcome: Progressing   Problem: Education: Goal: Ability to describe self-care measures that may prevent or decrease complications (Diabetes Survival Skills Education) will improve Outcome: Progressing Goal: Individualized Educational Video(s) Outcome: Progressing   Problem: Coping: Goal: Ability to adjust to condition or change in health will improve Outcome: Progressing   Problem: Fluid Volume: Goal: Ability to maintain a balanced intake and output will improve Outcome: Progressing   Problem: Health Behavior/Discharge Planning: Goal: Ability to identify and utilize available resources and services will improve Outcome: Progressing Goal: Ability to manage health-related needs will improve Outcome: Progressing   Problem: Metabolic: Goal: Ability to maintain appropriate glucose levels will improve Outcome: Progressing   Problem: Nutritional: Goal: Maintenance of adequate nutrition will improve Outcome: Progressing Goal: Progress toward achieving an optimal weight will improve Outcome: Progressing   Problem: Skin Integrity: Goal: Risk for impaired skin integrity will decrease Outcome: Progressing   Problem: Tissue Perfusion: Goal: Adequacy of tissue perfusion will improve Outcome: Progressing   Problem: Education: Goal: Knowledge of General Education information will improve Description: Including pain rating scale, medication(s)/side effects and non-pharmacologic comfort measures Outcome: Progressing   Problem: Health Behavior/Discharge Planning: Goal: Ability to manage health-related needs will improve Outcome: Progressing   Problem: Clinical Measurements: Goal: Ability to maintain clinical measurements within normal  limits will improve Outcome: Progressing Goal: Will remain free from infection Outcome: Progressing Goal: Diagnostic test results will improve Outcome: Progressing Goal: Respiratory complications will improve Outcome: Progressing Goal: Cardiovascular complication will be avoided Outcome: Progressing   Problem: Activity: Goal: Risk for activity intolerance will decrease Outcome: Progressing   Problem: Nutrition: Goal: Adequate nutrition will be maintained Outcome: Progressing   Problem: Coping: Goal: Level of anxiety will decrease Outcome: Progressing   Problem: Elimination: Goal: Will not experience complications related to bowel motility Outcome: Progressing Goal: Will not experience complications related to urinary retention  Outcome: Progressing   Problem: Pain Managment: Goal: General experience of comfort will improve Outcome: Progressing   Problem: Safety: Goal: Ability to remain free from injury will improve Outcome: Progressing   Problem: Skin Integrity: Goal: Risk for impaired skin integrity will decrease Outcome: Progressing   Problem: Education: Goal: Understanding of CV disease, CV risk reduction, and recovery process will improve Outcome: Progressing Goal: Individualized Educational Video(s) Outcome: Progressing   Problem: Activity: Goal: Ability to return to baseline activity level will improve Outcome: Progressing   Problem: Cardiovascular: Goal: Ability to achieve and maintain adequate cardiovascular perfusion will improve Outcome: Progressing Goal: Vascular access site(s) Level 0-1 will be maintained Outcome: Progressing   Problem: Health Behavior/Discharge Planning: Goal: Ability to safely manage health-related needs after discharge will improve Outcome: Progressing   Problem: Education: Goal: Ability to describe self-care measures that may prevent or decrease complications (Diabetes Survival Skills Education) will improve Outcome:  Progressing Goal: Individualized Educational Video(s) Outcome: Progressing   Problem: Coping: Goal: Ability to adjust to condition or change in health will improve Outcome: Progressing   Problem: Fluid Volume: Goal: Ability to maintain a balanced intake and output will improve Outcome: Progressing   Problem: Health Behavior/Discharge Planning: Goal: Ability to identify and utilize available resources and services will improve Outcome: Progressing Goal: Ability to manage health-related needs will improve Outcome: Progressing   Problem: Metabolic: Goal: Ability to maintain appropriate glucose levels will improve Outcome: Progressing   Problem: Nutritional: Goal: Maintenance of adequate nutrition will improve Outcome: Progressing Goal: Progress toward achieving an optimal weight will improve Outcome: Progressing   Problem: Skin Integrity: Goal: Risk for impaired skin integrity will decrease Outcome: Progressing   Problem: Tissue Perfusion: Goal: Adequacy of tissue perfusion will improve Outcome: Progressing

## 2022-08-29 NOTE — Progress Notes (Signed)
Progress Note  Patient Name: SIGIFREDO LOCICERO Date of Encounter: 08/29/2022  Primary Cardiologist: None   Subjective   No chest pain or sob. "They did too much dialysis on me last night." Feels better this morning.   Inpatient Medications    Scheduled Meds:  amLODipine  5 mg Oral Daily   aspirin  81 mg Oral Daily   aspirin EC  81 mg Oral Q breakfast   calcitRIOL  1.25 mcg Oral Q M,W,F-HD   carvedilol  12.5 mg Oral BID WC   feeding supplement  237 mL Oral BID BM   hydrALAZINE  100 mg Oral TID   insulin aspart  0-20 Units Subcutaneous TID WC   insulin aspart  0-5 Units Subcutaneous QHS   insulin aspart  3 Units Subcutaneous TID WC   insulin glargine-yfgn  10 Units Subcutaneous Daily   multivitamin  1 tablet Oral QHS   polyethylene glycol  17 g Oral Daily   rosuvastatin  10 mg Oral QPM   senna-docusate  2 tablet Oral BID   sodium chloride flush  3 mL Intravenous Q12H   sodium chloride flush  3 mL Intravenous Q12H   sodium chloride flush  3 mL Intravenous Q12H   sodium chloride flush  3 mL Intravenous Q12H   ticagrelor  90 mg Oral BID   Continuous Infusions:  sodium chloride     sodium chloride     nitroGLYCERIN 0 mcg/min (08/28/22 1920)   PRN Meds: sodium chloride, sodium chloride, acetaminophen, bisacodyl, hydrALAZINE, HYDROmorphone (DILAUDID) injection, loperamide, ondansetron **OR** ondansetron (ZOFRAN) IV, sodium chloride flush, sodium chloride flush, traZODone   Vital Signs    Vitals:   08/29/22 0200 08/29/22 0209 08/29/22 0605 08/29/22 0735  BP: (!) 179/70 (!) 179/70 (!) 168/60 (!) 162/75  Pulse: 82 87 82   Resp:  20 20 18   Temp:  98.6 F (37 C) 98.9 F (37.2 C) 99.3 F (37.4 C)  TempSrc:  Oral Oral Oral  SpO2: 100% 99% 100%   Weight:      Height:        Intake/Output Summary (Last 24 hours) at 08/29/2022 0903 Last data filed at 08/29/2022 0132 Gross per 24 hour  Intake 819.98 ml  Output 3775 ml  Net -2955.02 ml   Filed Weights   08/28/22 0621  08/28/22 2109 08/29/22 0150  Weight: 91.2 kg 91.6 kg 88 kg    Telemetry    NSR with 1AVB - Personally Reviewed  ECG    none - Personally Reviewed  Physical Exam   GEN: No acute distress.   Neck: No JVD Cardiac: RRR, no murmurs, rubs, or gallops.  Respiratory: Clear to auscultation bilaterally. GI: Soft, nontender, non-distended  MS: No edema; No deformity. Right wrist with no hematoma Neuro:  Nonfocal  Psych: Normal affect   Labs    Chemistry Recent Labs  Lab 08/27/22 0851 08/28/22 0100 08/28/22 1511 08/29/22 0303  NA 135 134* 132* 134*  K 4.3 3.6 3.9 3.3*  CL 100 100 95* 95*  CO2 25 23 19* 26  GLUCOSE 142* 160* 183* 130*  BUN 21 22 26* 9  CREATININE 4.89* 5.86* 6.46* 3.39*  CALCIUM 9.0 8.7* 8.8* 9.1  PROT 7.5 6.6  --  7.6  ALBUMIN 3.7 3.2* 3.5 3.5  AST 23 34  --  34  ALT 15 13  --  14  ALKPHOS 50 43  --  50  BILITOT 0.8 0.7  --  1.1  GFRNONAA 11* 9* 8*  17*  ANIONGAP 10 11 18* 13     Hematology Recent Labs  Lab 08/28/22 0100 08/28/22 1511 08/29/22 0303  WBC 12.2* 16.6* 16.0*  RBC 2.81* 2.95* 3.19*  HGB 8.6* 9.0* 9.6*  HCT 27.1* 28.0* 29.2*  MCV 96.4 94.9 91.5  MCH 30.6 30.5 30.1  MCHC 31.7 32.1 32.9  RDW 15.2 15.7* 15.5  PLT 256 289 274    Cardiac EnzymesNo results for input(s): "TROPONINI" in the last 168 hours. No results for input(s): "TROPIPOC" in the last 168 hours.   BNP Recent Labs  Lab 08/27/22 1106  BNP 867.0*     DDimer No results for input(s): "DDIMER" in the last 168 hours.   Radiology    CARDIAC CATHETERIZATION  Result Date: 08/28/2022   Mid LAD lesion is 95% stenosed.   Ost LAD to Prox LAD lesion is 30% stenosed.   Prox RCA lesion is 30% stenosed.   RPAV lesion is 80% stenosed.   A stent was successfully placed.   Post intervention, there is a 0% residual stenosis. 1.  RFR positive mid LAD lesion treated with orbital atherectomy, Cutting Balloon, and DES x 1 with OCT guidance. 2.  High-grade ostial RPLV lesion that should  be treated medically. 3.  LVEDP of 17 mmHg. Recommendation: Dual antiplatelet therapy for at least 1 year; aggressive cardiovascular risk factor modification.   ECHOCARDIOGRAM COMPLETE  Result Date: 08/27/2022    ECHOCARDIOGRAM REPORT   Patient Name:   JONAVAN KLECK [facility] Date of Exam: 08/27/2022 Medical Rec #:  [id number]        Height:       71.0 in Accession #:    [id number]       Weight:       202.4 lb Date of Birth:  February 11, 1941        BSA:          2.119 m Patient Age:    82 years         BP:           198/95 mmHg Patient Gender: M                HR:           70 bpm. [facility] Procedure: 2D Echo, Cardiac Doppler, Color Doppler and Intracardiac            Opacification Agent Indications:    Chest Pain R07.9  History:        Patient has no prior history of Echocardiogram examinations.                 Signs/Symptoms:Chest Pain; Risk Factors:Diabetes, Non-Smoker,                 Hypertension and Dyslipidemia.  Sonographer:    Aron Baba Referring Phys: [id number] Ellsworth Lennox  Sonographer Comments: Image acquisition challenging due to patient body habitus. IMPRESSIONS  1. Left ventricular ejection fraction, by estimation, is >75%. The left ventricle has hyperdynamic function. The left ventricle has no regional wall motion abnormalities. There is moderate left ventricular hypertrophy. Left ventricular diastolic parameters were normal.  2. Right ventricular systolic function was not well visualized. The right ventricular size is mildly enlarged. There is normal pulmonary artery systolic pressure.  3. Left atrial size was mildly dilated.  4. The mitral valve is normal in structure. No evidence of mitral valve regurgitation. No evidence of mitral stenosis.  5. The aortic valve is tricuspid. Aortic valve regurgitation is  not visualized. No aortic stenosis is present.  6. The inferior vena cava is normal in size with greater than 50% respiratory variability, suggesting right atrial pressure of 3  mmHg. Comparison(s): No prior Echocardiogram. FINDINGS  Left Ventricle: Left ventricular ejection fraction, by estimation, is >75%. The left ventricle has hyperdynamic function. The left ventricle has no regional wall motion abnormalities. Definity contrast agent was given IV to delineate the left ventricular endocardial borders. The left ventricular internal cavity size was normal in size. There is moderate left ventricular hypertrophy. Left ventricular diastolic parameters were normal. Right Ventricle: The right ventricular size is mildly enlarged. No increase in right ventricular wall thickness. Right ventricular systolic function was not well visualized. There is normal pulmonary artery systolic pressure. The tricuspid regurgitant velocity is 1.46 m/s, and with an assumed right atrial pressure of 3 mmHg, the estimated right ventricular systolic pressure is 11.5 mmHg. Left Atrium: Left atrial size was mildly dilated. Right Atrium: Right atrial size was normal in size. Pericardium: There is no evidence of pericardial effusion. Mitral Valve: The mitral valve is normal in structure. No evidence of mitral valve regurgitation. No evidence of mitral valve stenosis. MV peak gradient, 8.3 mmHg. The mean mitral valve gradient is 5.0 mmHg. Tricuspid Valve: The tricuspid valve is normal in structure. Tricuspid valve regurgitation is trivial. No evidence of tricuspid stenosis. Aortic Valve: The aortic valve is tricuspid. Aortic valve regurgitation is not visualized. No aortic stenosis is present. Pulmonic Valve: The pulmonic valve was not well visualized. Pulmonic valve regurgitation is trivial. No evidence of pulmonic stenosis. Aorta: The aortic root is normal in size and structure. Venous: The inferior vena cava is normal in size with greater than 50% respiratory variability, suggesting right atrial pressure of 3 mmHg. IAS/Shunts: No atrial level shunt detected by color flow Doppler.  LEFT VENTRICLE PLAX 2D LVIDd:          4.00 cm   Diastology LVIDs:         2.10 cm   LV e' medial:    6.67 cm/s LV PW:         1.30 cm   LV E/e' medial:  12.1 LV IVS:        1.20 cm   LV e' lateral:   8.36 cm/s LVOT diam:     1.90 cm   LV E/e' lateral: 9.7 LV SV:         56 LV SV Index:   27 LVOT Area:     2.84 cm  LEFT ATRIUM             Index        RIGHT ATRIUM           Index LA diam:        3.90 cm 1.84 cm/m   RA Area:     16.30 cm LA Vol (A2C):   86.0 ml 40.58 ml/m  RA Volume:   49.60 ml  23.41 ml/m LA Vol (A4C):   73.7 ml 34.78 ml/m LA Biplane Vol: 80.0 ml 37.75 ml/m  AORTIC VALVE             PULMONIC VALVE LVOT Vmax:   84.30 cm/s  PR End Diast Vel: 5.76 msec LVOT Vmean:  58.600 cm/s LVOT VTI:    0.199 m  AORTA Ao Root diam: 3.50 cm MITRAL VALVE               TRICUSPID VALVE MV Area (PHT): 4.21 cm    TR  Peak grad:   8.5 mmHg MV Area VTI:   2.17 cm    TR Vmax:        146.00 cm/s MV Peak grad:  8.3 mmHg MV Mean grad:  5.0 mmHg    SHUNTS MV Vmax:       1.44 m/s    Systemic VTI:  0.20 m MV Vmean:      104.0 cm/s  Systemic Diam: 1.90 cm MV Decel Time: 180 msec MV E velocity: 80.80 cm/s MV A velocity: 89.40 cm/s MV E/A ratio:  0.90 Vishnu Priya Mallipeddi Electronically signed by Winfield Rast Mallipeddi Signature Date/Time: 08/27/2022/12:54:10 PM    Final     Cardiac Studies   See above  Patient Profile     82 y.o. male admitted with [place] and s/p PCI of the mid LAD  Assessment & Plan    [place] - he is s/p PCI and doing well. Continue dual anti-platelet therapy. Ok for DC home s/p PCI ESRD - He dialyzes MWF.   [facility] will sign off.   Medication Recommendations:  Dual antiplatelet as he is currently taking Other recommendations (labs, testing, etc):  continue current meds Follow up as an outpatient:  Dr. Wyline Mood or Emilia Beck in 4-6  weeks   For questions or updates, please contact [facility] Please consult [email or web address] for contact info under Cardiology/STEMI.      Signed, Lewayne Bunting, MD  08/29/2022,  9:03 AM

## 2022-08-29 NOTE — Discharge Summary (Signed)
Physician Discharge Summary  Trevor [name] ZOX:[id number] DOB: 1940-10-22 DOA: 08/27/2022  PCP: Trevor Stabile, MD  Admit date: 08/27/2022 Discharge date: 08/29/2022  Admitted From:home Disposition: home Recommendations for Outpatient Follow-up:  Follow up with PCP in 1-2 weeks Please obtain BMP/CBC in one week Please follow up with cardiology  Home Health:none Equipment/Devices:none Discharge Condition:stable CODE STATUS:full Diet recommendation: cardiac renal Brief/Interim Summary:  82 y.o. male with PMHx relevant for ESRD, on HD MWF, DM2, HTN, HLD, anemia of chronic kidney disease presents to the ED with complaints of chest pain associated with nausea and vomiting.  Patient was started on heparin drip and nitro drip in the ER.  Troponins was trending up from 82-2 16 and 722 associated with chest pains.  Patient was transferred to [facility] from [facility] for cardiac cath.    Discharge Diagnoses:  Principal Problem:   Hypertensive emergency Active Problems:   Hypertensive heart disease with end stage renal disease (HCC)   Chest pain in adult   Diabetes mellitus with stage 5 chronic kidney disease (HCC)   End stage renal disease (HCC)   Anemia   Gastroesophageal reflux disease   Acute exacerbation of CHF (congestive heart failure) (HCC)    #1 atypical chest pain with elevated troponin in the setting of ESRD on hemodialysis Monday Wednesday and Friday.  Patient was transferred to [facility] from [facility] for cath.  Patient admitted with complaints of chest pain.  EKG showed no acute ST-T wave changes. Troponin peaked at 722. S/P PCI Echocardiogram normal ejection fraction Continue aspirin brillinta and Crestor   #2 hypertensive urgency-presented with chest pain  Chest x-ray showed volume overload findings  Continue hydralazine and Coreg Initial blood pressure was 222/132 was started on nitro drip    #3 end-stage renal disease on dialysis Monday Wednesday and Friday   #4  constipation KUB shows large stool burden on lactulose   #5 recent hip replacement   #6 type 2 diabetes on Lantus 10 units daily A1c 5.7   #7 anemia of chronic renal disease/iron deficiency anemia on iron replacement at home   #8 diastolic heart failure elevated BNP and chest x-ray findings consistent with fluid overload.  He had dialysis after Cath.    Nutrition Problem: Increased nutrient needs Etiology: chronic illness, catabolic illness (ESRD requiring HD) Signs/Symptoms: estimated needs Interventions: Ensure Enlive (each supplement provides 350kcal and 20 grams of protein), MVI  Estimated body mass index is 27.06 kg/m as calculated from the following:   Height as of this encounter: 5\' 11"  (1.803 m).   Weight as of this encounter: 88 kg.  Discharge Instructions  Discharge Instructions     Amb Referral to Cardiac Rehabilitation   Complete by: As directed    Diagnosis: Coronary Stents   After initial evaluation and assessments completed: Virtual Based Care may be provided alone or in conjunction with Phase 2 Cardiac Rehab based on patient barriers.: Yes   Intensive Cardiac Rehabilitation (ICR) [facility] location only OR Traditional Cardiac Rehabilitation (TCR) *If criteria for ICR are not met will enroll in TCR [name] only): Yes   Diet - low sodium heart healthy   Complete by: As directed    Increase activity slowly   Complete by: As directed       Allergies as of 08/29/2022   No Known Allergies      Medication List     STOP taking these medications    ondansetron 4 MG tablet Commonly known as: ZOFRAN   OVER  THE COUNTER MEDICATION   oxyCODONE 5 MG immediate release tablet Commonly known as: Oxy IR/ROXICODONE       TAKE these medications    Accu-Chek Aviva Plus test strip Generic drug: glucose blood USE TO TEST BLOOD SUGAR BEFORE BREAKFAST AND AT BEDTIME   Accu-Chek Softclix Lancets lancets   acetaminophen 325 MG tablet Commonly known as: TYLENOL Take 1-2  tablets (325-650 mg total) by mouth every 6 (six) hours as needed for mild pain (pain score 1-3 or temp > 100.5). What changed: Another medication with the same name was removed. Continue taking this medication, and follow the directions you see here.   amLODipine 5 MG tablet Commonly known as: NORVASC Take 1 tablet (5 mg total) by mouth daily. Start taking on: Aug 30, 2022   aspirin EC 81 MG tablet Take 1 tablet (81 mg total) by mouth daily with breakfast. Swallow whole. Start taking on: Aug 30, 2022   B-D SINGLE USE SWABS REGULAR Pads   Brilinta 90 MG Tabs tablet Generic drug: ticagrelor Take 1 tablet (90 mg total) by mouth 2 (two) times daily.   carvedilol 12.5 MG tablet Commonly known as: COREG Take 12.5 mg by mouth 2 (two) times daily with a meal.   CRANBERRY EXTRACT PO Take by mouth.   hydrALAZINE 100 MG tablet Commonly known as: APRESOLINE Take 50 mg by mouth 2 (two) times daily.   lactulose 10 GM/15ML solution Commonly known as: CHRONULAC Take 20 g by mouth daily as needed for moderate constipation.   nitroGLYCERIN 0.4 MG SL tablet Commonly known as: Nitrostat Place 1 tablet (0.4 mg total) under the tongue every 5 (five) minutes as needed for chest pain.   OneTouch Verio w/Device Kit 1 each by Does not apply route as needed.   polyethylene glycol 17 g packet Commonly known as: MIRALAX / GLYCOLAX Take 17 g by mouth daily as needed for mild constipation.   rosuvastatin 10 MG tablet Commonly known as: CRESTOR Take 10 mg by mouth every evening.   senna 8.6 MG Tabs tablet Commonly known as: SENOKOT Take 1 tablet (8.6 mg total) by mouth 2 (two) times daily.   Evaristo Bury FlexTouch 200 UNIT/ML FlexTouch Pen Generic drug: insulin degludec 10-24 Units as needed (blood sugar). Sliding scale Under 100 Do Not Take   VITAMIN D-3 PO Take by mouth.        No Known Allergies  Consultations:cardiology neuro  Procedures/Studies: CARDIAC CATHETERIZATION  Result  Date: 08/28/2022   Mid LAD lesion is 95% stenosed.   Ost LAD to Prox LAD lesion is 30% stenosed.   Prox RCA lesion is 30% stenosed.   RPAV lesion is 80% stenosed.   A stent was successfully placed.   Post intervention, there is a 0% residual stenosis. 1.  RFR positive mid LAD lesion treated with orbital atherectomy, Cutting Balloon, and DES x 1 with OCT guidance. 2.  High-grade ostial RPLV lesion that should be treated medically. 3.  LVEDP of 17 mmHg. Recommendation: Dual antiplatelet therapy for at least 1 year; aggressive cardiovascular risk factor modification.   ECHOCARDIOGRAM COMPLETE  Result Date: 08/27/2022    ECHOCARDIOGRAM REPORT   Patient Name:   Trevor [name] [facility] Date of Exam: 08/27/2022 Medical Rec #:  [id number]        Height:       71.0 in Accession #:    [id number]       Weight:       202.4 lb Date of Birth:  03/28/1941  BSA:          2.119 m Patient Age:    82 years         BP:           198/95 mmHg Patient Gender: M                HR:           70 bpm. [facility]am [facility] Procedure: 2D Echo, Cardiac Doppler, Color Doppler and Intracardiac            Opacification Agent Indications:    Chest Pain R07.9  History:        Patient has no prior history of Echocardiogram examinations.                 Signs/Symptoms:Chest Pain; Risk Factors:Diabetes, Non-Smoker,                 Hypertension and Dyslipidemia.  Sonographer:    Aron Baba Referring Phys: [id number] Ellsworth Lennox  Sonographer Comments: Image acquisition challenging due to patient body habitus. IMPRESSIONS  1. Left ventricular ejection fraction, by estimation, is >75%. The left ventricle has hyperdynamic function. The left ventricle has no regional wall motion abnormalities. There is moderate left ventricular hypertrophy. Left ventricular diastolic parameters were normal.  2. Right ventricular systolic function was not well visualized. The right ventricular size is mildly enlarged. There is normal pulmonary artery systolic  pressure.  3. Left atrial size was mildly dilated.  4. The mitral valve is normal in structure. No evidence of mitral valve regurgitation. No evidence of mitral stenosis.  5. The aortic valve is tricuspid. Aortic valve regurgitation is not visualized. No aortic stenosis is present.  6. The inferior vena cava is normal in size with greater than 50% respiratory variability, suggesting right atrial pressure of 3 mmHg. Comparison(s): No prior Echocardiogram. FINDINGS  Left Ventricle: Left ventricular ejection fraction, by estimation, is >75%. The left ventricle has hyperdynamic function. The left ventricle has no regional wall motion abnormalities. Definity contrast agent was given IV to delineate the left ventricular endocardial borders. The left ventricular internal cavity size was normal in size. There is moderate left ventricular hypertrophy. Left ventricular diastolic parameters were normal. Right Ventricle: The right ventricular size is mildly enlarged. No increase in right ventricular wall thickness. Right ventricular systolic function was not well visualized. There is normal pulmonary artery systolic pressure. The tricuspid regurgitant velocity is 1.46 m/s, and with an assumed right atrial pressure of 3 mmHg, the estimated right ventricular systolic pressure is 11.5 mmHg. Left Atrium: Left atrial size was mildly dilated. Right Atrium: Right atrial size was normal in size. Pericardium: There is no evidence of pericardial effusion. Mitral Valve: The mitral valve is normal in structure. No evidence of mitral valve regurgitation. No evidence of mitral valve stenosis. MV peak gradient, 8.3 mmHg. The mean mitral valve gradient is 5.0 mmHg. Tricuspid Valve: The tricuspid valve is normal in structure. Tricuspid valve regurgitation is trivial. No evidence of tricuspid stenosis. Aortic Valve: The aortic valve is tricuspid. Aortic valve regurgitation is not visualized. No aortic stenosis is present. Pulmonic Valve: The  pulmonic valve was not well visualized. Pulmonic valve regurgitation is trivial. No evidence of pulmonic stenosis. Aorta: The aortic root is normal in size and structure. Venous: The inferior vena cava is normal in size with greater than 50% respiratory variability, suggesting right atrial pressure of 3 mmHg. IAS/Shunts: No atrial level shunt detected by color flow Doppler.  LEFT VENTRICLE PLAX 2D LVIDd:         4.00 cm   Diastology LVIDs:         2.10 cm   LV e' medial:    6.67 cm/s LV PW:         1.30 cm   LV E/e' medial:  12.1 LV IVS:        1.20 cm   LV e' lateral:   8.36 cm/s LVOT diam:     1.90 cm   LV E/e' lateral: 9.7 LV SV:         56 LV SV Index:   27 LVOT Area:     2.84 cm  LEFT ATRIUM             Index        RIGHT ATRIUM           Index LA diam:        3.90 cm 1.84 cm/m   RA Area:     16.30 cm LA Vol (A2C):   86.0 ml 40.58 ml/m  RA Volume:   49.60 ml  23.41 ml/m LA Vol (A4C):   73.7 ml 34.78 ml/m LA Biplane Vol: 80.0 ml 37.75 ml/m  AORTIC VALVE             PULMONIC VALVE LVOT Vmax:   84.30 cm/s  PR End Diast Vel: 5.76 msec LVOT Vmean:  58.600 cm/s LVOT VTI:    0.199 m  AORTA Ao Root diam: 3.50 cm MITRAL VALVE               TRICUSPID VALVE MV Area (PHT): 4.21 cm    TR Peak grad:   8.5 mmHg MV Area VTI:   2.17 cm    TR Vmax:        146.00 cm/s MV Peak grad:  8.3 mmHg MV Mean grad:  5.0 mmHg    SHUNTS MV Vmax:       1.44 m/s    Systemic VTI:  0.20 m MV Vmean:      104.0 cm/s  Systemic Diam: 1.90 cm MV Decel Time: 180 msec MV E velocity: 80.80 cm/s MV A velocity: 89.40 cm/s MV E/A ratio:  0.90 Vishnu Priya Mallipeddi Electronically signed by Winfield Rast Mallipeddi Signature Date/Time: 08/27/2022/12:54:10 PM    Final    DG ABD ACUTE 2+V W 1V CHEST  Result Date: 08/27/2022 CLINICAL DATA:  Pain EXAM: DG ABDOMEN ACUTE WITH 1 VIEW CHEST COMPARISON:  CXR 10/23/20 FINDINGS: No pleural effusion. No pneumothorax. Normal cardiac and mediastinal contours. There are prominent bilateral interstitial  opacities could represent pulmonary venous congestion or atypical infection. Nonobstructive bowel gas pattern. Large colonic stool burden. No pneumatosis. No supine evidence of pneumoperitoneum. Bilateral hip arthroplasties. Degenerative changes in the lower lumbar spine. IMPRESSION: 1. Prominent bilateral interstitial opacities could represent pulmonary venous congestion or atypical infection. 2. Nonobstructive bowel gas pattern. Large colonic stool burden. Electronically Signed   By: Lorenza Cambridge M.D.   On: 08/27/2022 07:53   (Echo, Carotid, EGD, Colonoscopy, ERCP)    Subjective: Anxious to go home  Discharge Exam: Vitals:   08/29/22 0605 08/29/22 0735  BP: (!) 168/60 (!) 162/75  Pulse: 82   Resp: 20 18  Temp: 98.9 F (37.2 C) 99.3 F (37.4 C)  SpO2: 100%    Vitals:   08/29/22 0200 08/29/22 0209 08/29/22 0605 08/29/22 0735  BP: (!) 179/70 (!) 179/70 (!) 168/60 (!) 162/75  Pulse: 82 87 82  Resp:  20 20 18   Temp:  98.6 F (37 C) 98.9 F (37.2 C) 99.3 F (37.4 C)  TempSrc:  Oral Oral Oral  SpO2: 100% 99% 100%   Weight:      Height:        General: Pt is alert, awake, not in acute distress Cardiovascular: RRR, S1/S2 +, no rubs, no gallops Respiratory: CTA bilaterally, no wheezing, no rhonchi Abdominal: Soft, NT, ND, bowel sounds + Extremities: no edema, no cyanosis    The results of significant diagnostics from this hospitalization (including imaging, microbiology, ancillary and laboratory) are listed below for reference.     Microbiology: Recent Results (from the past 240 hour(s))  MRSA Next Gen by PCR, Nasal     Status: None   Collection Time: 08/29/22  6:50 AM   Specimen: Nasal Mucosa; Nasal Swab  Result Value Ref Range Status   MRSA by PCR Next Gen NOT DETECTED NOT DETECTED Final    Comment: (NOTE) The GeneXpert MRSA Assay (FDA approved for NASAL specimens only), is one component of a comprehensive MRSA colonization surveillance program. It is not intended to  diagnose MRSA infection nor to guide or monitor treatment for MRSA infections. Test performance is not FDA approved in patients less than 56 years old. Performed at [facility], [address] [place], [place] [address]      Labs: BNP (last 3 results) Recent Labs    08/27/22 1106  BNP 867.0*   Basic Metabolic Panel: Recent Labs  Lab 08/27/22 0851 08/28/22 0100 08/28/22 1511 08/29/22 0303  NA 135 134* 132* 134*  K 4.3 3.6 3.9 3.3*  CL 100 100 95* 95*  CO2 25 23 19* 26  GLUCOSE 142* 160* 183* 130*  BUN 21 22 26* 9  CREATININE 4.89* 5.86* 6.46* 3.39*  CALCIUM 9.0 8.7* 8.8* 9.1  PHOS  --   --  3.6  --    Liver Function Tests: Recent Labs  Lab 08/27/22 0851 08/28/22 0100 08/28/22 1511 08/29/22 0303  AST 23 34  --  34  ALT 15 13  --  14  ALKPHOS 50 43  --  50  BILITOT 0.8 0.7  --  1.1  PROT 7.5 6.6  --  7.6  ALBUMIN 3.7 3.2* 3.5 3.5   Recent Labs  Lab 08/27/22 0851  LIPASE 44   No results for input(s): "AMMONIA" in the last 168 hours. CBC: Recent Labs  Lab 08/27/22 0851 08/28/22 0100 08/28/22 1511 08/29/22 0303  WBC 10.2 12.2* 16.6* 16.0*  NEUTROABS 8.1*  --   --   --   HGB 9.3* 8.6* 9.0* 9.6*  HCT 30.3* 27.1* 28.0* 29.2*  MCV 100.7* 96.4 94.9 91.5  PLT 236 256 289 274   Cardiac Enzymes: No results for input(s): "CKTOTAL", "CKMB", "CKMBINDEX", "TROPONINI" in the last 168 hours. BNP: Invalid input(s): "POCBNP" CBG: Recent Labs  Lab 08/28/22 0612 08/28/22 1159 08/28/22 1618 08/28/22 2043 08/29/22 0849  GLUCAP 123* 144* 190* 97 137*   D-Dimer No results for input(s): "DDIMER" in the last 72 hours. Hgb A1c No results for input(s): "HGBA1C" in the last 72 hours. Lipid Profile No results for input(s): "CHOL", "HDL", "LDLCALC", "TRIG", "CHOLHDL", "LDLDIRECT" in the last 72 hours. Thyroid function studies No results for input(s): "TSH", "T4TOTAL", "T3FREE", "THYROIDAB" in the last 72 hours.  Invalid input(s): "FREET3" Anemia work  up No results for input(s): "VITAMINB12", "FOLATE", "FERRITIN", "TIBC", "IRON", "RETICCTPCT" in the last 72 hours. Urinalysis    Component Value  Date/Time   COLORURINE YELLOW 09/06/2020 1003   APPEARANCEUR CLEAR 09/06/2020 1003   LABSPEC 1.011 09/06/2020 1003   PHURINE 7.0 09/06/2020 1003   GLUCOSEU NEGATIVE 09/06/2020 1003   HGBUR NEGATIVE 09/06/2020 1003   BILIRUBINUR NEGATIVE 09/06/2020 1003   KETONESUR NEGATIVE 09/06/2020 1003   PROTEINUR >=300 (A) 09/06/2020 1003   NITRITE NEGATIVE 09/06/2020 1003   LEUKOCYTESUR TRACE (A) 09/06/2020 1003   Sepsis Labs Recent Labs  Lab 08/27/22 0851 08/28/22 0100 08/28/22 1511 08/29/22 0303  WBC 10.2 12.2* 16.6* 16.0*   Microbiology Recent Results (from the past 240 hour(s))  MRSA Next Gen by PCR, Nasal     Status: None   Collection Time: 08/29/22  6:50 AM   Specimen: Nasal Mucosa; Nasal Swab  Result Value Ref Range Status   MRSA by PCR Next Gen NOT DETECTED NOT DETECTED Final    Comment: (NOTE) The GeneXpert MRSA Assay (FDA approved for NASAL specimens only), is one component of a comprehensive MRSA colonization surveillance program. It is not intended to diagnose MRSA infection nor to guide or monitor treatment for MRSA infections. Test performance is not FDA approved in patients less than 47 years old. Performed at [facility], [address] [place], [place] [address]      Time coordinating discharge: 39 minutes  SIGNED: Alwyn Ren, MD  [facility] 08/29/2022, 5:01 PM

## 2022-08-29 NOTE — Progress Notes (Signed)
Received patient in bed to unit.  Alert and oriented.  Informed consent signed and in chart.   TX duration:4 hours  Patient tolerated well.  Transported back to the room  Alert, without acute distress.  Hand-off given to patient's nurse.   Access used: avg Access issues: none  Total UF removed: 3500 Medication(s) given: none Post HD VS: see table below Post HD weight: 88.0kg   08/29/22 0132  Vitals  Temp 98 F (36.7 C)  Temp Source Oral  BP (!) 164/79  MAP (mmHg) 102  BP Location Right Arm  BP Method Automatic  Patient Position (if appropriate) Lying  Pulse Rate 83  Pulse Rate Source Monitor  ECG Heart Rate 83  Resp 20  Oxygen Therapy  SpO2 100 %  O2 Device Room Air  Patient Activity (if Appropriate) In bed  Pulse Oximetry Type Continuous  During Treatment Monitoring  HD Safety Checks Performed Yes  Intra-Hemodialysis Comments Tolerated well  Post Treatment  Dialyzer Clearance Lightly streaked  Duration of HD Treatment -hour(s) 4 hour(s)  Hemodialysis Intake (mL) 0 mL  Liters Processed 84  Fluid Removed (mL) 3500 mL  Tolerated HD Treatment Yes  Post-Hemodialysis Comments goal not met  AVG/AVF Arterial Site Held (minutes) 10 minutes  AVG/AVF Venous Site Held (minutes) 5 minutes  Fistula / Graft Left Upper arm Arteriovenous fistula  No placement date or time found.   Placed prior to admission: Yes  Orientation: Left  Access Location: Upper arm  Access Type: Arteriovenous fistula  Site Condition No complications  Fistula / Graft Assessment Present;Thrill;Bruit  Status Deaccessed;Flushed;Patent      [name]

## 2022-08-29 NOTE — Progress Notes (Signed)
[facility] Progress Note   Assessment/ Plan:   1. NSTEMI: s/p atherectomy and DES x 1.  Off nitro, for d/c today 2. ESRD: MWF [facility], HD Friday.  Next HD monday 3. Anemia: Hgb 8.6, ESA when appropriate 4. 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 Nutrition:renal diet with fluid restriction 6. Hypertension: on nitro gtt   Subjective:    Seen in room.  S/p HD, 3.5L off.     Objective:   BP (!) 162/75 (BP Location: Right Arm)   Pulse 82   Temp 99.3 F (37.4 C) (Oral)   Resp 18   Ht 5\' 11"  (1.803 m)   Wt 88 kg Comment: bed scale  SpO2 100%   BMI 27.06 kg/m   Physical Exam: ZOX:WRUEA in bed CVS: RRR Resp: clear GU: R groin site with some oozing Abd: soft Ext: no LE edema ACCESS: + AVF L side  Labs: BMET Recent Labs  Lab 08/27/22 0851 08/28/22 0100 08/28/22 1511 08/29/22 0303  NA 135 134* 132* 134*  K 4.3 3.6 3.9 3.3*  CL 100 100 95* 95*  CO2 25 23 19* 26  GLUCOSE 142* 160* 183* 130*  BUN 21 22 26* 9  CREATININE 4.89* 5.86* 6.46* 3.39*  CALCIUM 9.0 8.7* 8.8* 9.1  PHOS  --   --  3.6  --    CBC Recent Labs  Lab 08/27/22 0851 08/28/22 0100 08/28/22 1511 08/29/22 0303  WBC 10.2 12.2* 16.6* 16.0*  NEUTROABS 8.1*  --   --   --   HGB 9.3* 8.6* 9.0* 9.6*  HCT 30.3* 27.1* 28.0* 29.2*  MCV 100.7* 96.4 94.9 91.5  PLT 236 256 289 274      Medications:     amLODipine  5 mg Oral Daily   aspirin  81 mg Oral Daily   aspirin EC  81 mg Oral Q breakfast   calcitRIOL  1.25 mcg Oral Q M,W,F-HD   carvedilol  12.5 mg Oral BID WC   feeding supplement  237 mL Oral BID BM   hydrALAZINE  100 mg Oral TID   insulin aspart  0-20 Units Subcutaneous TID WC   insulin aspart  0-5 Units Subcutaneous QHS   insulin aspart  3 Units Subcutaneous TID WC   insulin glargine-yfgn  10 Units Subcutaneous Daily   multivitamin  1 tablet Oral QHS   polyethylene glycol  17 g Oral Daily   rosuvastatin  10 mg Oral QPM   senna-docusate  2 tablet Oral BID   sodium  chloride flush  3 mL Intravenous Q12H   sodium chloride flush  3 mL Intravenous Q12H   sodium chloride flush  3 mL Intravenous Q12H   sodium chloride flush  3 mL Intravenous Q12H   ticagrelor  90 mg Oral BID     Bufford Buttner, MD 08/29/2022, 9:39 AM

## 2022-08-31 ENCOUNTER — Encounter (HOSPITAL_COMMUNITY): Payer: Self-pay | Admitting: Internal Medicine

## 2022-08-31 DIAGNOSIS — N2581 Secondary hyperparathyroidism of renal origin: Secondary | ICD-10-CM | POA: Diagnosis not present

## 2022-08-31 DIAGNOSIS — N186 End stage renal disease: Secondary | ICD-10-CM | POA: Diagnosis not present

## 2022-08-31 DIAGNOSIS — Z992 Dependence on renal dialysis: Secondary | ICD-10-CM | POA: Diagnosis not present

## 2022-08-31 NOTE — Progress Notes (Signed)
Late Entry Note  Pt was d/c on Saturday. Contacted [facility] this morning and spoke to [place]. Clinic advised of pt's d/c date and that pt should resume care today. Pt's d/c summary and last renal note faxed to clinic for continuation of care.   Olivia Canter Renal Navigator [phone]

## 2022-09-01 LAB — LIPOPROTEIN A (LPA): Lipoprotein (a): 128.8 nmol/L — ABNORMAL HIGH (ref <75.0)

## 2022-09-02 DIAGNOSIS — N2581 Secondary hyperparathyroidism of renal origin: Secondary | ICD-10-CM | POA: Diagnosis not present

## 2022-09-02 DIAGNOSIS — Z992 Dependence on renal dialysis: Secondary | ICD-10-CM | POA: Diagnosis not present

## 2022-09-02 DIAGNOSIS — N186 End stage renal disease: Secondary | ICD-10-CM | POA: Diagnosis not present

## 2022-09-04 DIAGNOSIS — Z992 Dependence on renal dialysis: Secondary | ICD-10-CM | POA: Diagnosis not present

## 2022-09-04 DIAGNOSIS — N186 End stage renal disease: Secondary | ICD-10-CM | POA: Diagnosis not present

## 2022-09-04 DIAGNOSIS — N2581 Secondary hyperparathyroidism of renal origin: Secondary | ICD-10-CM | POA: Diagnosis not present

## 2022-09-07 DIAGNOSIS — N2581 Secondary hyperparathyroidism of renal origin: Secondary | ICD-10-CM | POA: Diagnosis not present

## 2022-09-07 DIAGNOSIS — N186 End stage renal disease: Secondary | ICD-10-CM | POA: Diagnosis not present

## 2022-09-07 DIAGNOSIS — Z992 Dependence on renal dialysis: Secondary | ICD-10-CM | POA: Diagnosis not present

## 2022-09-09 DIAGNOSIS — N186 End stage renal disease: Secondary | ICD-10-CM | POA: Diagnosis not present

## 2022-09-09 DIAGNOSIS — N2581 Secondary hyperparathyroidism of renal origin: Secondary | ICD-10-CM | POA: Diagnosis not present

## 2022-09-09 DIAGNOSIS — Z992 Dependence on renal dialysis: Secondary | ICD-10-CM | POA: Diagnosis not present

## 2022-09-11 DIAGNOSIS — Z992 Dependence on renal dialysis: Secondary | ICD-10-CM | POA: Diagnosis not present

## 2022-09-11 DIAGNOSIS — N2581 Secondary hyperparathyroidism of renal origin: Secondary | ICD-10-CM | POA: Diagnosis not present

## 2022-09-11 DIAGNOSIS — N186 End stage renal disease: Secondary | ICD-10-CM | POA: Diagnosis not present

## 2022-09-14 DIAGNOSIS — Z992 Dependence on renal dialysis: Secondary | ICD-10-CM | POA: Diagnosis not present

## 2022-09-14 DIAGNOSIS — N186 End stage renal disease: Secondary | ICD-10-CM | POA: Diagnosis not present

## 2022-09-14 DIAGNOSIS — N2581 Secondary hyperparathyroidism of renal origin: Secondary | ICD-10-CM | POA: Diagnosis not present

## 2022-09-15 DIAGNOSIS — N185 Chronic kidney disease, stage 5: Secondary | ICD-10-CM | POA: Diagnosis not present

## 2022-09-15 DIAGNOSIS — E1165 Type 2 diabetes mellitus with hyperglycemia: Secondary | ICD-10-CM | POA: Insufficient documentation

## 2022-09-15 DIAGNOSIS — I129 Hypertensive chronic kidney disease with stage 1 through stage 4 chronic kidney disease, or unspecified chronic kidney disease: Secondary | ICD-10-CM | POA: Insufficient documentation

## 2022-09-15 DIAGNOSIS — I770 Arteriovenous fistula, acquired: Secondary | ICD-10-CM | POA: Diagnosis not present

## 2022-09-15 DIAGNOSIS — E785 Hyperlipidemia, unspecified: Secondary | ICD-10-CM | POA: Diagnosis not present

## 2022-09-15 DIAGNOSIS — I25110 Atherosclerotic heart disease of native coronary artery with unstable angina pectoris: Secondary | ICD-10-CM | POA: Diagnosis not present

## 2022-09-15 DIAGNOSIS — I2510 Atherosclerotic heart disease of native coronary artery without angina pectoris: Secondary | ICD-10-CM | POA: Insufficient documentation

## 2022-09-15 DIAGNOSIS — Z992 Dependence on renal dialysis: Secondary | ICD-10-CM | POA: Diagnosis not present

## 2022-09-15 DIAGNOSIS — D649 Anemia, unspecified: Secondary | ICD-10-CM | POA: Diagnosis not present

## 2022-09-15 DIAGNOSIS — I132 Hypertensive heart and chronic kidney disease with heart failure and with stage 5 chronic kidney disease, or end stage renal disease: Secondary | ICD-10-CM | POA: Diagnosis not present

## 2022-09-15 DIAGNOSIS — I5032 Chronic diastolic (congestive) heart failure: Secondary | ICD-10-CM | POA: Diagnosis not present

## 2022-09-15 DIAGNOSIS — R079 Chest pain, unspecified: Secondary | ICD-10-CM | POA: Diagnosis not present

## 2022-09-16 DIAGNOSIS — Z992 Dependence on renal dialysis: Secondary | ICD-10-CM | POA: Diagnosis not present

## 2022-09-16 DIAGNOSIS — N186 End stage renal disease: Secondary | ICD-10-CM | POA: Diagnosis not present

## 2022-09-16 DIAGNOSIS — N2581 Secondary hyperparathyroidism of renal origin: Secondary | ICD-10-CM | POA: Diagnosis not present

## 2022-09-18 DIAGNOSIS — N186 End stage renal disease: Secondary | ICD-10-CM | POA: Diagnosis not present

## 2022-09-18 DIAGNOSIS — N2581 Secondary hyperparathyroidism of renal origin: Secondary | ICD-10-CM | POA: Diagnosis not present

## 2022-09-18 DIAGNOSIS — Z992 Dependence on renal dialysis: Secondary | ICD-10-CM | POA: Diagnosis not present

## 2022-09-21 DIAGNOSIS — N186 End stage renal disease: Secondary | ICD-10-CM | POA: Diagnosis not present

## 2022-09-21 DIAGNOSIS — Z992 Dependence on renal dialysis: Secondary | ICD-10-CM | POA: Diagnosis not present

## 2022-09-21 DIAGNOSIS — N2581 Secondary hyperparathyroidism of renal origin: Secondary | ICD-10-CM | POA: Diagnosis not present

## 2022-09-22 ENCOUNTER — Encounter: Payer: Self-pay | Admitting: Orthopaedic Surgery

## 2022-09-22 ENCOUNTER — Ambulatory Visit (INDEPENDENT_AMBULATORY_CARE_PROVIDER_SITE_OTHER): Payer: Medicare HMO | Admitting: Orthopaedic Surgery

## 2022-09-22 DIAGNOSIS — G8929 Other chronic pain: Secondary | ICD-10-CM | POA: Diagnosis not present

## 2022-09-22 DIAGNOSIS — M25562 Pain in left knee: Secondary | ICD-10-CM

## 2022-09-22 NOTE — Progress Notes (Signed)
My knee is better.  He had a MI several weeks ago and had to have a stent placed.  He is much better now.  His left knee is not tender now.  He is walking well.  Left knee has slight effusion and crepitus, ROM 0 to 110, stable, NV intact.  Encounter Diagnosis  Name Primary?   Chronic pain of left knee Yes   I will see him prn.  Call if any problem.  Precautions discussed.  Electronically Signed [name], MD 6/11/20248:41 AM

## 2022-09-23 DIAGNOSIS — N186 End stage renal disease: Secondary | ICD-10-CM | POA: Diagnosis not present

## 2022-09-23 DIAGNOSIS — Z992 Dependence on renal dialysis: Secondary | ICD-10-CM | POA: Diagnosis not present

## 2022-09-23 DIAGNOSIS — N2581 Secondary hyperparathyroidism of renal origin: Secondary | ICD-10-CM | POA: Diagnosis not present

## 2022-09-25 DIAGNOSIS — N186 End stage renal disease: Secondary | ICD-10-CM | POA: Diagnosis not present

## 2022-09-25 DIAGNOSIS — Z992 Dependence on renal dialysis: Secondary | ICD-10-CM | POA: Diagnosis not present

## 2022-09-25 DIAGNOSIS — N2581 Secondary hyperparathyroidism of renal origin: Secondary | ICD-10-CM | POA: Diagnosis not present

## 2022-09-28 DIAGNOSIS — Z992 Dependence on renal dialysis: Secondary | ICD-10-CM | POA: Diagnosis not present

## 2022-09-28 DIAGNOSIS — N2581 Secondary hyperparathyroidism of renal origin: Secondary | ICD-10-CM | POA: Diagnosis not present

## 2022-09-28 DIAGNOSIS — N186 End stage renal disease: Secondary | ICD-10-CM | POA: Diagnosis not present

## 2022-09-30 DIAGNOSIS — Z992 Dependence on renal dialysis: Secondary | ICD-10-CM | POA: Diagnosis not present

## 2022-09-30 DIAGNOSIS — N2581 Secondary hyperparathyroidism of renal origin: Secondary | ICD-10-CM | POA: Diagnosis not present

## 2022-09-30 DIAGNOSIS — N186 End stage renal disease: Secondary | ICD-10-CM | POA: Diagnosis not present

## 2022-10-02 DIAGNOSIS — N2581 Secondary hyperparathyroidism of renal origin: Secondary | ICD-10-CM | POA: Diagnosis not present

## 2022-10-02 DIAGNOSIS — Z992 Dependence on renal dialysis: Secondary | ICD-10-CM | POA: Diagnosis not present

## 2022-10-02 DIAGNOSIS — N186 End stage renal disease: Secondary | ICD-10-CM | POA: Diagnosis not present

## 2022-10-05 DIAGNOSIS — N2581 Secondary hyperparathyroidism of renal origin: Secondary | ICD-10-CM | POA: Diagnosis not present

## 2022-10-05 DIAGNOSIS — Z992 Dependence on renal dialysis: Secondary | ICD-10-CM | POA: Diagnosis not present

## 2022-10-05 DIAGNOSIS — N186 End stage renal disease: Secondary | ICD-10-CM | POA: Diagnosis not present

## 2022-10-07 DIAGNOSIS — N186 End stage renal disease: Secondary | ICD-10-CM | POA: Diagnosis not present

## 2022-10-07 DIAGNOSIS — N2581 Secondary hyperparathyroidism of renal origin: Secondary | ICD-10-CM | POA: Diagnosis not present

## 2022-10-07 DIAGNOSIS — Z992 Dependence on renal dialysis: Secondary | ICD-10-CM | POA: Diagnosis not present

## 2022-10-09 DIAGNOSIS — N186 End stage renal disease: Secondary | ICD-10-CM | POA: Diagnosis not present

## 2022-10-09 DIAGNOSIS — Z992 Dependence on renal dialysis: Secondary | ICD-10-CM | POA: Diagnosis not present

## 2022-10-09 DIAGNOSIS — N2581 Secondary hyperparathyroidism of renal origin: Secondary | ICD-10-CM | POA: Diagnosis not present

## 2022-10-11 DIAGNOSIS — Z992 Dependence on renal dialysis: Secondary | ICD-10-CM | POA: Diagnosis not present

## 2022-10-11 DIAGNOSIS — N186 End stage renal disease: Secondary | ICD-10-CM | POA: Diagnosis not present

## 2022-10-12 DIAGNOSIS — Z992 Dependence on renal dialysis: Secondary | ICD-10-CM | POA: Diagnosis not present

## 2022-10-12 DIAGNOSIS — N186 End stage renal disease: Secondary | ICD-10-CM | POA: Diagnosis not present

## 2022-10-12 DIAGNOSIS — N2581 Secondary hyperparathyroidism of renal origin: Secondary | ICD-10-CM | POA: Diagnosis not present

## 2022-10-14 DIAGNOSIS — N2581 Secondary hyperparathyroidism of renal origin: Secondary | ICD-10-CM | POA: Diagnosis not present

## 2022-10-14 DIAGNOSIS — N186 End stage renal disease: Secondary | ICD-10-CM | POA: Diagnosis not present

## 2022-10-14 DIAGNOSIS — Z992 Dependence on renal dialysis: Secondary | ICD-10-CM | POA: Diagnosis not present

## 2022-10-16 DIAGNOSIS — N186 End stage renal disease: Secondary | ICD-10-CM | POA: Diagnosis not present

## 2022-10-16 DIAGNOSIS — N2581 Secondary hyperparathyroidism of renal origin: Secondary | ICD-10-CM | POA: Diagnosis not present

## 2022-10-16 DIAGNOSIS — Z992 Dependence on renal dialysis: Secondary | ICD-10-CM | POA: Diagnosis not present

## 2022-10-19 DIAGNOSIS — N2581 Secondary hyperparathyroidism of renal origin: Secondary | ICD-10-CM | POA: Diagnosis not present

## 2022-10-19 DIAGNOSIS — E1122 Type 2 diabetes mellitus with diabetic chronic kidney disease: Secondary | ICD-10-CM | POA: Diagnosis not present

## 2022-10-19 DIAGNOSIS — N186 End stage renal disease: Secondary | ICD-10-CM | POA: Diagnosis not present

## 2022-10-19 DIAGNOSIS — Z992 Dependence on renal dialysis: Secondary | ICD-10-CM | POA: Diagnosis not present

## 2022-10-21 DIAGNOSIS — Z992 Dependence on renal dialysis: Secondary | ICD-10-CM | POA: Diagnosis not present

## 2022-10-21 DIAGNOSIS — N2581 Secondary hyperparathyroidism of renal origin: Secondary | ICD-10-CM | POA: Diagnosis not present

## 2022-10-21 DIAGNOSIS — N186 End stage renal disease: Secondary | ICD-10-CM | POA: Diagnosis not present

## 2022-10-23 DIAGNOSIS — N2581 Secondary hyperparathyroidism of renal origin: Secondary | ICD-10-CM | POA: Diagnosis not present

## 2022-10-23 DIAGNOSIS — Z992 Dependence on renal dialysis: Secondary | ICD-10-CM | POA: Diagnosis not present

## 2022-10-23 DIAGNOSIS — N186 End stage renal disease: Secondary | ICD-10-CM | POA: Diagnosis not present

## 2022-10-26 DIAGNOSIS — N2581 Secondary hyperparathyroidism of renal origin: Secondary | ICD-10-CM | POA: Diagnosis not present

## 2022-10-26 DIAGNOSIS — Z992 Dependence on renal dialysis: Secondary | ICD-10-CM | POA: Diagnosis not present

## 2022-10-26 DIAGNOSIS — N186 End stage renal disease: Secondary | ICD-10-CM | POA: Diagnosis not present

## 2022-10-28 DIAGNOSIS — N2581 Secondary hyperparathyroidism of renal origin: Secondary | ICD-10-CM | POA: Diagnosis not present

## 2022-10-28 DIAGNOSIS — Z992 Dependence on renal dialysis: Secondary | ICD-10-CM | POA: Diagnosis not present

## 2022-10-28 DIAGNOSIS — N186 End stage renal disease: Secondary | ICD-10-CM | POA: Diagnosis not present

## 2022-10-30 DIAGNOSIS — Z992 Dependence on renal dialysis: Secondary | ICD-10-CM | POA: Diagnosis not present

## 2022-10-30 DIAGNOSIS — N186 End stage renal disease: Secondary | ICD-10-CM | POA: Diagnosis not present

## 2022-10-30 DIAGNOSIS — N2581 Secondary hyperparathyroidism of renal origin: Secondary | ICD-10-CM | POA: Diagnosis not present

## 2022-11-02 DIAGNOSIS — Z992 Dependence on renal dialysis: Secondary | ICD-10-CM | POA: Diagnosis not present

## 2022-11-02 DIAGNOSIS — N2581 Secondary hyperparathyroidism of renal origin: Secondary | ICD-10-CM | POA: Diagnosis not present

## 2022-11-02 DIAGNOSIS — N186 End stage renal disease: Secondary | ICD-10-CM | POA: Diagnosis not present

## 2022-11-04 DIAGNOSIS — N2581 Secondary hyperparathyroidism of renal origin: Secondary | ICD-10-CM | POA: Diagnosis not present

## 2022-11-04 DIAGNOSIS — Z992 Dependence on renal dialysis: Secondary | ICD-10-CM | POA: Diagnosis not present

## 2022-11-04 DIAGNOSIS — N186 End stage renal disease: Secondary | ICD-10-CM | POA: Diagnosis not present

## 2022-11-06 DIAGNOSIS — Z992 Dependence on renal dialysis: Secondary | ICD-10-CM | POA: Diagnosis not present

## 2022-11-06 DIAGNOSIS — N186 End stage renal disease: Secondary | ICD-10-CM | POA: Diagnosis not present

## 2022-11-06 DIAGNOSIS — N2581 Secondary hyperparathyroidism of renal origin: Secondary | ICD-10-CM | POA: Diagnosis not present

## 2022-11-09 DIAGNOSIS — N186 End stage renal disease: Secondary | ICD-10-CM | POA: Diagnosis not present

## 2022-11-09 DIAGNOSIS — N2581 Secondary hyperparathyroidism of renal origin: Secondary | ICD-10-CM | POA: Diagnosis not present

## 2022-11-09 DIAGNOSIS — Z992 Dependence on renal dialysis: Secondary | ICD-10-CM | POA: Diagnosis not present

## 2022-11-10 DIAGNOSIS — I10 Essential (primary) hypertension: Secondary | ICD-10-CM | POA: Diagnosis not present

## 2022-11-10 DIAGNOSIS — E1165 Type 2 diabetes mellitus with hyperglycemia: Secondary | ICD-10-CM | POA: Diagnosis not present

## 2022-11-11 DIAGNOSIS — N186 End stage renal disease: Secondary | ICD-10-CM | POA: Diagnosis not present

## 2022-11-11 DIAGNOSIS — Z992 Dependence on renal dialysis: Secondary | ICD-10-CM | POA: Diagnosis not present

## 2022-11-11 DIAGNOSIS — N2581 Secondary hyperparathyroidism of renal origin: Secondary | ICD-10-CM | POA: Diagnosis not present

## 2022-11-13 DIAGNOSIS — Z992 Dependence on renal dialysis: Secondary | ICD-10-CM | POA: Diagnosis not present

## 2022-11-13 DIAGNOSIS — N186 End stage renal disease: Secondary | ICD-10-CM | POA: Diagnosis not present

## 2022-11-13 DIAGNOSIS — N2581 Secondary hyperparathyroidism of renal origin: Secondary | ICD-10-CM | POA: Diagnosis not present

## 2022-11-16 DIAGNOSIS — Z992 Dependence on renal dialysis: Secondary | ICD-10-CM | POA: Diagnosis not present

## 2022-11-16 DIAGNOSIS — N2581 Secondary hyperparathyroidism of renal origin: Secondary | ICD-10-CM | POA: Diagnosis not present

## 2022-11-16 DIAGNOSIS — N186 End stage renal disease: Secondary | ICD-10-CM | POA: Diagnosis not present

## 2022-11-18 DIAGNOSIS — Z992 Dependence on renal dialysis: Secondary | ICD-10-CM | POA: Diagnosis not present

## 2022-11-18 DIAGNOSIS — N186 End stage renal disease: Secondary | ICD-10-CM | POA: Diagnosis not present

## 2022-11-18 DIAGNOSIS — N2581 Secondary hyperparathyroidism of renal origin: Secondary | ICD-10-CM | POA: Diagnosis not present

## 2022-11-20 DIAGNOSIS — N2581 Secondary hyperparathyroidism of renal origin: Secondary | ICD-10-CM | POA: Diagnosis not present

## 2022-11-20 DIAGNOSIS — N186 End stage renal disease: Secondary | ICD-10-CM | POA: Diagnosis not present

## 2022-11-20 DIAGNOSIS — Z992 Dependence on renal dialysis: Secondary | ICD-10-CM | POA: Diagnosis not present

## 2022-11-23 DIAGNOSIS — Z992 Dependence on renal dialysis: Secondary | ICD-10-CM | POA: Diagnosis not present

## 2022-11-23 DIAGNOSIS — N186 End stage renal disease: Secondary | ICD-10-CM | POA: Diagnosis not present

## 2022-11-23 DIAGNOSIS — N2581 Secondary hyperparathyroidism of renal origin: Secondary | ICD-10-CM | POA: Diagnosis not present

## 2022-11-25 DIAGNOSIS — Z992 Dependence on renal dialysis: Secondary | ICD-10-CM | POA: Diagnosis not present

## 2022-11-25 DIAGNOSIS — N2581 Secondary hyperparathyroidism of renal origin: Secondary | ICD-10-CM | POA: Diagnosis not present

## 2022-11-25 DIAGNOSIS — N186 End stage renal disease: Secondary | ICD-10-CM | POA: Diagnosis not present

## 2022-11-27 DIAGNOSIS — N2581 Secondary hyperparathyroidism of renal origin: Secondary | ICD-10-CM | POA: Diagnosis not present

## 2022-11-27 DIAGNOSIS — N186 End stage renal disease: Secondary | ICD-10-CM | POA: Diagnosis not present

## 2022-11-27 DIAGNOSIS — Z992 Dependence on renal dialysis: Secondary | ICD-10-CM | POA: Diagnosis not present

## 2022-11-30 DIAGNOSIS — Z992 Dependence on renal dialysis: Secondary | ICD-10-CM | POA: Diagnosis not present

## 2022-11-30 DIAGNOSIS — N2581 Secondary hyperparathyroidism of renal origin: Secondary | ICD-10-CM | POA: Diagnosis not present

## 2022-11-30 DIAGNOSIS — N186 End stage renal disease: Secondary | ICD-10-CM | POA: Diagnosis not present

## 2022-12-02 DIAGNOSIS — Z992 Dependence on renal dialysis: Secondary | ICD-10-CM | POA: Diagnosis not present

## 2022-12-02 DIAGNOSIS — N186 End stage renal disease: Secondary | ICD-10-CM | POA: Diagnosis not present

## 2022-12-02 DIAGNOSIS — N2581 Secondary hyperparathyroidism of renal origin: Secondary | ICD-10-CM | POA: Diagnosis not present

## 2022-12-04 DIAGNOSIS — Z992 Dependence on renal dialysis: Secondary | ICD-10-CM | POA: Diagnosis not present

## 2022-12-04 DIAGNOSIS — N2581 Secondary hyperparathyroidism of renal origin: Secondary | ICD-10-CM | POA: Diagnosis not present

## 2022-12-04 DIAGNOSIS — N186 End stage renal disease: Secondary | ICD-10-CM | POA: Diagnosis not present

## 2022-12-07 DIAGNOSIS — N186 End stage renal disease: Secondary | ICD-10-CM | POA: Diagnosis not present

## 2022-12-07 DIAGNOSIS — Z992 Dependence on renal dialysis: Secondary | ICD-10-CM | POA: Diagnosis not present

## 2022-12-07 DIAGNOSIS — N2581 Secondary hyperparathyroidism of renal origin: Secondary | ICD-10-CM | POA: Diagnosis not present

## 2022-12-09 DIAGNOSIS — N186 End stage renal disease: Secondary | ICD-10-CM | POA: Diagnosis not present

## 2022-12-09 DIAGNOSIS — N2581 Secondary hyperparathyroidism of renal origin: Secondary | ICD-10-CM | POA: Diagnosis not present

## 2022-12-09 DIAGNOSIS — Z992 Dependence on renal dialysis: Secondary | ICD-10-CM | POA: Diagnosis not present

## 2022-12-10 DIAGNOSIS — I5032 Chronic diastolic (congestive) heart failure: Secondary | ICD-10-CM | POA: Diagnosis not present

## 2022-12-10 DIAGNOSIS — E785 Hyperlipidemia, unspecified: Secondary | ICD-10-CM | POA: Diagnosis not present

## 2022-12-10 DIAGNOSIS — I25110 Atherosclerotic heart disease of native coronary artery with unstable angina pectoris: Secondary | ICD-10-CM | POA: Diagnosis not present

## 2022-12-10 DIAGNOSIS — N186 End stage renal disease: Secondary | ICD-10-CM | POA: Diagnosis not present

## 2022-12-10 DIAGNOSIS — D631 Anemia in chronic kidney disease: Secondary | ICD-10-CM | POA: Diagnosis not present

## 2022-12-10 DIAGNOSIS — Z992 Dependence on renal dialysis: Secondary | ICD-10-CM | POA: Diagnosis not present

## 2022-12-10 DIAGNOSIS — I132 Hypertensive heart and chronic kidney disease with heart failure and with stage 5 chronic kidney disease, or end stage renal disease: Secondary | ICD-10-CM | POA: Diagnosis not present

## 2022-12-10 DIAGNOSIS — E1122 Type 2 diabetes mellitus with diabetic chronic kidney disease: Secondary | ICD-10-CM | POA: Diagnosis not present

## 2022-12-10 DIAGNOSIS — S72002D Fracture of unspecified part of neck of left femur, subsequent encounter for closed fracture with routine healing: Secondary | ICD-10-CM | POA: Diagnosis not present

## 2022-12-10 DIAGNOSIS — I129 Hypertensive chronic kidney disease with stage 1 through stage 4 chronic kidney disease, or unspecified chronic kidney disease: Secondary | ICD-10-CM | POA: Diagnosis not present

## 2022-12-10 DIAGNOSIS — T82858A Stenosis of vascular prosthetic devices, implants and grafts, initial encounter: Secondary | ICD-10-CM | POA: Diagnosis not present

## 2022-12-11 DIAGNOSIS — Z992 Dependence on renal dialysis: Secondary | ICD-10-CM | POA: Diagnosis not present

## 2022-12-11 DIAGNOSIS — N2581 Secondary hyperparathyroidism of renal origin: Secondary | ICD-10-CM | POA: Diagnosis not present

## 2022-12-11 DIAGNOSIS — N186 End stage renal disease: Secondary | ICD-10-CM | POA: Diagnosis not present

## 2022-12-12 DIAGNOSIS — Z992 Dependence on renal dialysis: Secondary | ICD-10-CM | POA: Diagnosis not present

## 2022-12-12 DIAGNOSIS — N186 End stage renal disease: Secondary | ICD-10-CM | POA: Diagnosis not present

## 2022-12-14 DIAGNOSIS — N2581 Secondary hyperparathyroidism of renal origin: Secondary | ICD-10-CM | POA: Diagnosis not present

## 2022-12-14 DIAGNOSIS — Z992 Dependence on renal dialysis: Secondary | ICD-10-CM | POA: Diagnosis not present

## 2022-12-14 DIAGNOSIS — N186 End stage renal disease: Secondary | ICD-10-CM | POA: Diagnosis not present

## 2022-12-16 DIAGNOSIS — N186 End stage renal disease: Secondary | ICD-10-CM | POA: Diagnosis not present

## 2022-12-16 DIAGNOSIS — N2581 Secondary hyperparathyroidism of renal origin: Secondary | ICD-10-CM | POA: Diagnosis not present

## 2022-12-16 DIAGNOSIS — Z992 Dependence on renal dialysis: Secondary | ICD-10-CM | POA: Diagnosis not present

## 2022-12-18 DIAGNOSIS — N186 End stage renal disease: Secondary | ICD-10-CM | POA: Diagnosis not present

## 2022-12-18 DIAGNOSIS — Z992 Dependence on renal dialysis: Secondary | ICD-10-CM | POA: Diagnosis not present

## 2022-12-18 DIAGNOSIS — N2581 Secondary hyperparathyroidism of renal origin: Secondary | ICD-10-CM | POA: Diagnosis not present

## 2022-12-21 DIAGNOSIS — N2581 Secondary hyperparathyroidism of renal origin: Secondary | ICD-10-CM | POA: Diagnosis not present

## 2022-12-21 DIAGNOSIS — Z992 Dependence on renal dialysis: Secondary | ICD-10-CM | POA: Diagnosis not present

## 2022-12-21 DIAGNOSIS — N186 End stage renal disease: Secondary | ICD-10-CM | POA: Diagnosis not present

## 2022-12-23 DIAGNOSIS — N186 End stage renal disease: Secondary | ICD-10-CM | POA: Diagnosis not present

## 2022-12-23 DIAGNOSIS — N2581 Secondary hyperparathyroidism of renal origin: Secondary | ICD-10-CM | POA: Diagnosis not present

## 2022-12-23 DIAGNOSIS — Z992 Dependence on renal dialysis: Secondary | ICD-10-CM | POA: Diagnosis not present

## 2022-12-25 DIAGNOSIS — N186 End stage renal disease: Secondary | ICD-10-CM | POA: Diagnosis not present

## 2022-12-25 DIAGNOSIS — N2581 Secondary hyperparathyroidism of renal origin: Secondary | ICD-10-CM | POA: Diagnosis not present

## 2022-12-25 DIAGNOSIS — Z992 Dependence on renal dialysis: Secondary | ICD-10-CM | POA: Diagnosis not present

## 2022-12-28 DIAGNOSIS — Z992 Dependence on renal dialysis: Secondary | ICD-10-CM | POA: Diagnosis not present

## 2022-12-28 DIAGNOSIS — N2581 Secondary hyperparathyroidism of renal origin: Secondary | ICD-10-CM | POA: Diagnosis not present

## 2022-12-28 DIAGNOSIS — N186 End stage renal disease: Secondary | ICD-10-CM | POA: Diagnosis not present

## 2022-12-30 DIAGNOSIS — N2581 Secondary hyperparathyroidism of renal origin: Secondary | ICD-10-CM | POA: Diagnosis not present

## 2022-12-30 DIAGNOSIS — N186 End stage renal disease: Secondary | ICD-10-CM | POA: Diagnosis not present

## 2022-12-30 DIAGNOSIS — Z992 Dependence on renal dialysis: Secondary | ICD-10-CM | POA: Diagnosis not present

## 2023-01-01 DIAGNOSIS — N2581 Secondary hyperparathyroidism of renal origin: Secondary | ICD-10-CM | POA: Diagnosis not present

## 2023-01-01 DIAGNOSIS — N186 End stage renal disease: Secondary | ICD-10-CM | POA: Diagnosis not present

## 2023-01-01 DIAGNOSIS — Z992 Dependence on renal dialysis: Secondary | ICD-10-CM | POA: Diagnosis not present

## 2023-01-04 DIAGNOSIS — N2581 Secondary hyperparathyroidism of renal origin: Secondary | ICD-10-CM | POA: Diagnosis not present

## 2023-01-04 DIAGNOSIS — Z992 Dependence on renal dialysis: Secondary | ICD-10-CM | POA: Diagnosis not present

## 2023-01-04 DIAGNOSIS — N186 End stage renal disease: Secondary | ICD-10-CM | POA: Diagnosis not present

## 2023-01-06 DIAGNOSIS — Z992 Dependence on renal dialysis: Secondary | ICD-10-CM | POA: Diagnosis not present

## 2023-01-06 DIAGNOSIS — N2581 Secondary hyperparathyroidism of renal origin: Secondary | ICD-10-CM | POA: Diagnosis not present

## 2023-01-06 DIAGNOSIS — N186 End stage renal disease: Secondary | ICD-10-CM | POA: Diagnosis not present

## 2023-01-08 ENCOUNTER — Encounter (HOSPITAL_COMMUNITY)
Admission: RE | Admit: 2023-01-08 | Discharge: 2023-01-08 | Disposition: A | Discharge status: Home / Self Care | Payer: Medicare HMO | Source: Ambulatory Visit | Attending: Internal Medicine | Admitting: Internal Medicine

## 2023-01-08 DIAGNOSIS — N2581 Secondary hyperparathyroidism of renal origin: Secondary | ICD-10-CM | POA: Diagnosis not present

## 2023-01-08 DIAGNOSIS — Z955 Presence of coronary angioplasty implant and graft: Secondary | ICD-10-CM | POA: Insufficient documentation

## 2023-01-08 DIAGNOSIS — N186 End stage renal disease: Secondary | ICD-10-CM | POA: Diagnosis not present

## 2023-01-08 DIAGNOSIS — Z992 Dependence on renal dialysis: Secondary | ICD-10-CM | POA: Diagnosis not present

## 2023-01-11 DIAGNOSIS — Z992 Dependence on renal dialysis: Secondary | ICD-10-CM | POA: Diagnosis not present

## 2023-01-11 DIAGNOSIS — N2581 Secondary hyperparathyroidism of renal origin: Secondary | ICD-10-CM | POA: Diagnosis not present

## 2023-01-11 DIAGNOSIS — N186 End stage renal disease: Secondary | ICD-10-CM | POA: Diagnosis not present

## 2023-01-13 DIAGNOSIS — N2581 Secondary hyperparathyroidism of renal origin: Secondary | ICD-10-CM | POA: Diagnosis not present

## 2023-01-13 DIAGNOSIS — N186 End stage renal disease: Secondary | ICD-10-CM | POA: Diagnosis not present

## 2023-01-13 DIAGNOSIS — Z992 Dependence on renal dialysis: Secondary | ICD-10-CM | POA: Diagnosis not present

## 2023-01-15 DIAGNOSIS — N186 End stage renal disease: Secondary | ICD-10-CM | POA: Diagnosis not present

## 2023-01-15 DIAGNOSIS — N2581 Secondary hyperparathyroidism of renal origin: Secondary | ICD-10-CM | POA: Diagnosis not present

## 2023-01-15 DIAGNOSIS — Z992 Dependence on renal dialysis: Secondary | ICD-10-CM | POA: Diagnosis not present

## 2023-01-18 DIAGNOSIS — N2581 Secondary hyperparathyroidism of renal origin: Secondary | ICD-10-CM | POA: Diagnosis not present

## 2023-01-18 DIAGNOSIS — N186 End stage renal disease: Secondary | ICD-10-CM | POA: Diagnosis not present

## 2023-01-18 DIAGNOSIS — Z992 Dependence on renal dialysis: Secondary | ICD-10-CM | POA: Diagnosis not present

## 2023-01-19 ENCOUNTER — Encounter (HOSPITAL_COMMUNITY): Payer: Medicare HMO

## 2023-01-20 DIAGNOSIS — N186 End stage renal disease: Secondary | ICD-10-CM | POA: Diagnosis not present

## 2023-01-20 DIAGNOSIS — Z992 Dependence on renal dialysis: Secondary | ICD-10-CM | POA: Diagnosis not present

## 2023-01-20 DIAGNOSIS — N2581 Secondary hyperparathyroidism of renal origin: Secondary | ICD-10-CM | POA: Diagnosis not present

## 2023-01-22 DIAGNOSIS — N2581 Secondary hyperparathyroidism of renal origin: Secondary | ICD-10-CM | POA: Diagnosis not present

## 2023-01-22 DIAGNOSIS — N186 End stage renal disease: Secondary | ICD-10-CM | POA: Diagnosis not present

## 2023-01-22 DIAGNOSIS — Z992 Dependence on renal dialysis: Secondary | ICD-10-CM | POA: Diagnosis not present

## 2023-01-25 DIAGNOSIS — E1122 Type 2 diabetes mellitus with diabetic chronic kidney disease: Secondary | ICD-10-CM | POA: Diagnosis not present

## 2023-01-25 DIAGNOSIS — N2581 Secondary hyperparathyroidism of renal origin: Secondary | ICD-10-CM | POA: Diagnosis not present

## 2023-01-25 DIAGNOSIS — N186 End stage renal disease: Secondary | ICD-10-CM | POA: Diagnosis not present

## 2023-01-25 DIAGNOSIS — Z992 Dependence on renal dialysis: Secondary | ICD-10-CM | POA: Diagnosis not present

## 2023-01-27 DIAGNOSIS — Z992 Dependence on renal dialysis: Secondary | ICD-10-CM | POA: Diagnosis not present

## 2023-01-27 DIAGNOSIS — N186 End stage renal disease: Secondary | ICD-10-CM | POA: Diagnosis not present

## 2023-01-27 DIAGNOSIS — N2581 Secondary hyperparathyroidism of renal origin: Secondary | ICD-10-CM | POA: Diagnosis not present

## 2023-01-29 DIAGNOSIS — N2581 Secondary hyperparathyroidism of renal origin: Secondary | ICD-10-CM | POA: Diagnosis not present

## 2023-01-29 DIAGNOSIS — Z992 Dependence on renal dialysis: Secondary | ICD-10-CM | POA: Diagnosis not present

## 2023-01-29 DIAGNOSIS — N186 End stage renal disease: Secondary | ICD-10-CM | POA: Diagnosis not present

## 2023-02-01 ENCOUNTER — Other Ambulatory Visit: Payer: Self-pay

## 2023-02-01 ENCOUNTER — Inpatient Hospital Stay (HOSPITAL_COMMUNITY)
Admission: EM | Admit: 2023-02-01 | Discharge: 2023-02-03 | DRG: 323 | Disposition: A | Discharge status: Home / Self Care | Payer: Medicare HMO | Attending: Internal Medicine | Admitting: Internal Medicine

## 2023-02-01 ENCOUNTER — Emergency Department (HOSPITAL_COMMUNITY): Payer: Medicare HMO

## 2023-02-01 ENCOUNTER — Encounter (HOSPITAL_COMMUNITY): Payer: Self-pay | Admitting: Emergency Medicine

## 2023-02-01 DIAGNOSIS — Z7982 Long term (current) use of aspirin: Secondary | ICD-10-CM

## 2023-02-01 DIAGNOSIS — R109 Unspecified abdominal pain: Secondary | ICD-10-CM | POA: Insufficient documentation

## 2023-02-01 DIAGNOSIS — Z9861 Coronary angioplasty status: Secondary | ICD-10-CM | POA: Diagnosis not present

## 2023-02-01 DIAGNOSIS — Z794 Long term (current) use of insulin: Secondary | ICD-10-CM | POA: Diagnosis not present

## 2023-02-01 DIAGNOSIS — Z87442 Personal history of urinary calculi: Secondary | ICD-10-CM

## 2023-02-01 DIAGNOSIS — I132 Hypertensive heart and chronic kidney disease with heart failure and with stage 5 chronic kidney disease, or end stage renal disease: Secondary | ICD-10-CM | POA: Diagnosis not present

## 2023-02-01 DIAGNOSIS — I214 Non-ST elevation (NSTEMI) myocardial infarction: Secondary | ICD-10-CM | POA: Diagnosis not present

## 2023-02-01 DIAGNOSIS — E785 Hyperlipidemia, unspecified: Secondary | ICD-10-CM | POA: Diagnosis present

## 2023-02-01 DIAGNOSIS — Z833 Family history of diabetes mellitus: Secondary | ICD-10-CM | POA: Diagnosis not present

## 2023-02-01 DIAGNOSIS — I5020 Unspecified systolic (congestive) heart failure: Secondary | ICD-10-CM

## 2023-02-01 DIAGNOSIS — J90 Pleural effusion, not elsewhere classified: Secondary | ICD-10-CM | POA: Diagnosis not present

## 2023-02-01 DIAGNOSIS — R7989 Other specified abnormal findings of blood chemistry: Secondary | ICD-10-CM | POA: Diagnosis not present

## 2023-02-01 DIAGNOSIS — I517 Cardiomegaly: Secondary | ICD-10-CM | POA: Diagnosis not present

## 2023-02-01 DIAGNOSIS — N250 Renal osteodystrophy: Secondary | ICD-10-CM | POA: Diagnosis not present

## 2023-02-01 DIAGNOSIS — I509 Heart failure, unspecified: Secondary | ICD-10-CM | POA: Diagnosis not present

## 2023-02-01 DIAGNOSIS — Z96643 Presence of artificial hip joint, bilateral: Secondary | ICD-10-CM | POA: Diagnosis present

## 2023-02-01 DIAGNOSIS — I10 Essential (primary) hypertension: Secondary | ICD-10-CM | POA: Diagnosis not present

## 2023-02-01 DIAGNOSIS — E1165 Type 2 diabetes mellitus with hyperglycemia: Secondary | ICD-10-CM | POA: Diagnosis present

## 2023-02-01 DIAGNOSIS — T82855A Stenosis of coronary artery stent, initial encounter: Secondary | ICD-10-CM | POA: Diagnosis present

## 2023-02-01 DIAGNOSIS — N189 Chronic kidney disease, unspecified: Secondary | ICD-10-CM | POA: Diagnosis not present

## 2023-02-01 DIAGNOSIS — E8889 Other specified metabolic disorders: Secondary | ICD-10-CM | POA: Diagnosis not present

## 2023-02-01 DIAGNOSIS — R1013 Epigastric pain: Secondary | ICD-10-CM | POA: Diagnosis present

## 2023-02-01 DIAGNOSIS — I5031 Acute diastolic (congestive) heart failure: Secondary | ICD-10-CM | POA: Diagnosis not present

## 2023-02-01 DIAGNOSIS — R0989 Other specified symptoms and signs involving the circulatory and respiratory systems: Secondary | ICD-10-CM | POA: Diagnosis present

## 2023-02-01 DIAGNOSIS — R0789 Other chest pain: Secondary | ICD-10-CM | POA: Diagnosis not present

## 2023-02-01 DIAGNOSIS — I2510 Atherosclerotic heart disease of native coronary artery without angina pectoris: Secondary | ICD-10-CM | POA: Diagnosis not present

## 2023-02-01 DIAGNOSIS — N186 End stage renal disease: Secondary | ICD-10-CM

## 2023-02-01 DIAGNOSIS — E1122 Type 2 diabetes mellitus with diabetic chronic kidney disease: Secondary | ICD-10-CM | POA: Diagnosis present

## 2023-02-01 DIAGNOSIS — Z7902 Long term (current) use of antithrombotics/antiplatelets: Secondary | ICD-10-CM | POA: Diagnosis not present

## 2023-02-01 DIAGNOSIS — Z8249 Family history of ischemic heart disease and other diseases of the circulatory system: Secondary | ICD-10-CM | POA: Diagnosis not present

## 2023-02-01 DIAGNOSIS — Y831 Surgical operation with implant of artificial internal device as the cause of abnormal reaction of the patient, or of later complication, without mention of misadventure at the time of the procedure: Secondary | ICD-10-CM | POA: Diagnosis present

## 2023-02-01 DIAGNOSIS — Z992 Dependence on renal dialysis: Secondary | ICD-10-CM | POA: Diagnosis not present

## 2023-02-01 DIAGNOSIS — N2581 Secondary hyperparathyroidism of renal origin: Secondary | ICD-10-CM | POA: Diagnosis present

## 2023-02-01 DIAGNOSIS — I252 Old myocardial infarction: Secondary | ICD-10-CM | POA: Insufficient documentation

## 2023-02-01 DIAGNOSIS — R079 Chest pain, unspecified: Secondary | ICD-10-CM | POA: Diagnosis not present

## 2023-02-01 DIAGNOSIS — Z79899 Other long term (current) drug therapy: Secondary | ICD-10-CM

## 2023-02-01 DIAGNOSIS — Q245 Malformation of coronary vessels: Secondary | ICD-10-CM | POA: Diagnosis not present

## 2023-02-01 DIAGNOSIS — R0602 Shortness of breath: Secondary | ICD-10-CM | POA: Diagnosis not present

## 2023-02-01 DIAGNOSIS — D631 Anemia in chronic kidney disease: Secondary | ICD-10-CM | POA: Diagnosis not present

## 2023-02-01 DIAGNOSIS — I120 Hypertensive chronic kidney disease with stage 5 chronic kidney disease or end stage renal disease: Secondary | ICD-10-CM | POA: Diagnosis not present

## 2023-02-01 DIAGNOSIS — E8779 Other fluid overload: Secondary | ICD-10-CM | POA: Diagnosis not present

## 2023-02-01 DIAGNOSIS — E782 Mixed hyperlipidemia: Secondary | ICD-10-CM

## 2023-02-01 DIAGNOSIS — I5021 Acute systolic (congestive) heart failure: Secondary | ICD-10-CM | POA: Diagnosis not present

## 2023-02-01 DIAGNOSIS — Z955 Presence of coronary angioplasty implant and graft: Secondary | ICD-10-CM

## 2023-02-01 LAB — CBC
HCT: 37.2 % — ABNORMAL LOW (ref 39.0–52.0)
Hemoglobin: 12.3 g/dL — ABNORMAL LOW (ref 13.0–17.0)
MCH: 30.7 pg (ref 26.0–34.0)
MCHC: 33.1 g/dL (ref 30.0–36.0)
MCV: 92.8 fL (ref 80.0–100.0)
Platelets: 244 10*3/uL (ref 150–400)
RBC: 4.01 MIL/uL — ABNORMAL LOW (ref 4.22–5.81)
RDW: 15.3 % (ref 11.5–15.5)
WBC: 12.8 10*3/uL — ABNORMAL HIGH (ref 4.0–10.5)
nRBC: 0 % (ref 0.0–0.2)

## 2023-02-01 LAB — PHOSPHORUS: Phosphorus: 6.9 mg/dL — ABNORMAL HIGH (ref 2.5–4.6)

## 2023-02-01 LAB — CBG MONITORING, ED
Glucose-Capillary: 142 mg/dL — ABNORMAL HIGH (ref 70–99)
Glucose-Capillary: 166 mg/dL — ABNORMAL HIGH (ref 70–99)
Glucose-Capillary: 136 mg/dL — ABNORMAL HIGH (ref 70–99)

## 2023-02-01 LAB — BASIC METABOLIC PANEL
Anion gap: 13 (ref 5–15)
BUN: 53 mg/dL — ABNORMAL HIGH (ref 8–23)
CO2: 22 mmol/L (ref 22–32)
Calcium: 9 mg/dL (ref 8.9–10.3)
Chloride: 97 mmol/L — ABNORMAL LOW (ref 98–111)
Creatinine, Ser: 8.45 mg/dL — ABNORMAL HIGH (ref 0.61–1.24)
GFR, Estimated: 6 mL/min — ABNORMAL LOW (ref >60)
Glucose, Bld: 194 mg/dL — ABNORMAL HIGH (ref 70–99)
Potassium: 4.1 mmol/L (ref 3.5–5.1)
Sodium: 132 mmol/L — ABNORMAL LOW (ref 135–145)

## 2023-02-01 LAB — BRAIN NATRIURETIC PEPTIDE: B Natriuretic Peptide: 1259 pg/mL — ABNORMAL HIGH (ref 0.0–100.0)

## 2023-02-01 LAB — HEPATIC FUNCTION PANEL
ALT: 15 U/L (ref 0–44)
AST: 17 U/L (ref 15–41)
Albumin: 3.8 g/dL (ref 3.5–5.0)
Alkaline Phosphatase: 43 U/L (ref 38–126)
Bilirubin, Direct: 0.1 mg/dL (ref 0.0–0.2)
Indirect Bilirubin: 0.4 mg/dL (ref 0.3–0.9)
Total Bilirubin: 0.5 mg/dL (ref 0.3–1.2)
Total Protein: 7.7 g/dL (ref 6.5–8.1)

## 2023-02-01 LAB — TROPONIN I (HIGH SENSITIVITY)
Troponin I (High Sensitivity): 1079 ng/L (ref <18)
Troponin I (High Sensitivity): 941 ng/L (ref <18)

## 2023-02-01 LAB — HEMOGLOBIN A1C
Hgb A1c MFr Bld: 5.3 % (ref 4.8–5.6)
Mean Plasma Glucose: 105.41 mg/dL

## 2023-02-01 LAB — GLUCOSE, CAPILLARY: Glucose-Capillary: 162 mg/dL — ABNORMAL HIGH (ref 70–99)

## 2023-02-01 LAB — HEPARIN LEVEL (UNFRACTIONATED): Heparin Unfractionated: 0.3 [IU]/mL (ref 0.30–0.70)

## 2023-02-01 LAB — MAGNESIUM: Magnesium: 1.9 mg/dL (ref 1.7–2.4)

## 2023-02-01 MED ORDER — PANTOPRAZOLE SODIUM 40 MG IV SOLR
40.0000 mg | INTRAVENOUS | Status: DC
  Administered 2023-02-01 – 2023-02-03 (×3): 40 mg via INTRAVENOUS
  Filled 2023-02-01 (×3): qty 10

## 2023-02-01 MED ORDER — CARVEDILOL 12.5 MG PO TABS
12.5000 mg | ORAL_TABLET | Freq: Two times a day (BID) | ORAL | Status: DC
  Administered 2023-02-01 – 2023-02-03 (×5): 12.5 mg via ORAL
  Filled 2023-02-01 (×5): qty 1

## 2023-02-01 MED ORDER — ASPIRIN 81 MG PO TBEC
81.0000 mg | DELAYED_RELEASE_TABLET | Freq: Every day | ORAL | Status: DC
  Administered 2023-02-01 – 2023-02-03 (×3): 81 mg via ORAL
  Filled 2023-02-01 (×3): qty 1

## 2023-02-01 MED ORDER — ASPIRIN 81 MG PO CHEW
324.0000 mg | CHEWABLE_TABLET | Freq: Once | ORAL | Status: AC
  Administered 2023-02-01: 324 mg via ORAL
  Filled 2023-02-01: qty 4

## 2023-02-01 MED ORDER — HEPARIN (PORCINE) 25000 UT/250ML-% IV SOLN
1100.0000 [IU]/h | INTRAVENOUS | Status: DC
Start: 2023-02-01 — End: 2023-02-02
  Administered 2023-02-01: 1000 [IU]/h via INTRAVENOUS
  Filled 2023-02-01 (×2): qty 250

## 2023-02-01 MED ORDER — ALUM & MAG HYDROXIDE-SIMETH 200-200-20 MG/5ML PO SUSP
30.0000 mL | Freq: Once | ORAL | Status: AC
  Administered 2023-02-01: 30 mL via ORAL
  Filled 2023-02-01: qty 30

## 2023-02-01 MED ORDER — FENTANYL CITRATE PF 50 MCG/ML IJ SOSY
50.0000 ug | PREFILLED_SYRINGE | Freq: Once | INTRAMUSCULAR | Status: AC
  Administered 2023-02-01: 50 ug via INTRAVENOUS
  Filled 2023-02-01: qty 1

## 2023-02-01 MED ORDER — HYDROMORPHONE HCL 1 MG/ML IJ SOLN
1.0000 mg | Freq: Once | INTRAMUSCULAR | Status: AC
Start: 2023-02-01 — End: 2023-02-01
  Administered 2023-02-01: 1 mg via INTRAVENOUS
  Filled 2023-02-01: qty 1

## 2023-02-01 MED ORDER — HEPARIN BOLUS VIA INFUSION
4000.0000 [IU] | Freq: Once | INTRAVENOUS | Status: AC
  Administered 2023-02-01: 4000 [IU] via INTRAVENOUS

## 2023-02-01 MED ORDER — LIDOCAINE VISCOUS HCL 2 % MT SOLN
15.0000 mL | Freq: Once | OROMUCOSAL | Status: AC
  Administered 2023-02-01: 15 mL via ORAL
  Filled 2023-02-01: qty 15

## 2023-02-01 MED ORDER — FAMOTIDINE 20 MG PO TABS
20.0000 mg | ORAL_TABLET | Freq: Once | ORAL | Status: AC
  Administered 2023-02-01: 20 mg via ORAL
  Filled 2023-02-01: qty 1

## 2023-02-01 MED ORDER — ONDANSETRON HCL 4 MG/2ML IJ SOLN
4.0000 mg | Freq: Four times a day (QID) | INTRAMUSCULAR | Status: DC | PRN
  Administered 2023-02-01: 4 mg via INTRAVENOUS
  Filled 2023-02-01: qty 2

## 2023-02-01 MED ORDER — ACETAMINOPHEN 325 MG PO TABS
650.0000 mg | ORAL_TABLET | Freq: Four times a day (QID) | ORAL | Status: DC | PRN
  Administered 2023-02-01 (×3): 650 mg via ORAL
  Filled 2023-02-01 (×3): qty 2

## 2023-02-01 MED ORDER — AMLODIPINE BESYLATE 5 MG PO TABS
5.0000 mg | ORAL_TABLET | Freq: Every day | ORAL | Status: DC
  Administered 2023-02-01 – 2023-02-03 (×3): 5 mg via ORAL
  Filled 2023-02-01 (×3): qty 1

## 2023-02-01 MED ORDER — HYDRALAZINE HCL 25 MG PO TABS
50.0000 mg | ORAL_TABLET | Freq: Two times a day (BID) | ORAL | Status: DC
  Administered 2023-02-01 – 2023-02-03 (×4): 50 mg via ORAL
  Filled 2023-02-01 (×4): qty 2

## 2023-02-01 MED ORDER — TICAGRELOR 90 MG PO TABS
90.0000 mg | ORAL_TABLET | Freq: Two times a day (BID) | ORAL | Status: DC
  Administered 2023-02-01 – 2023-02-03 (×4): 90 mg via ORAL
  Filled 2023-02-01 (×4): qty 1

## 2023-02-01 MED ORDER — PANTOPRAZOLE SODIUM 40 MG IV SOLR
40.0000 mg | Freq: Once | INTRAVENOUS | Status: AC
Start: 2023-02-01 — End: 2023-02-01
  Administered 2023-02-01: 40 mg via INTRAVENOUS
  Filled 2023-02-01: qty 10

## 2023-02-01 MED ORDER — NITROGLYCERIN 0.4 MG SL SUBL
0.4000 mg | SUBLINGUAL_TABLET | SUBLINGUAL | Status: DC | PRN
  Administered 2023-02-02 (×2): 0.4 mg via SUBLINGUAL
  Filled 2023-02-01 (×2): qty 1

## 2023-02-01 MED ORDER — ROSUVASTATIN CALCIUM 5 MG PO TABS
10.0000 mg | ORAL_TABLET | Freq: Every evening | ORAL | Status: DC
  Administered 2023-02-01 – 2023-02-02 (×2): 10 mg via ORAL
  Filled 2023-02-01: qty 2
  Filled 2023-02-01: qty 1

## 2023-02-01 MED ORDER — ACETAMINOPHEN 650 MG RE SUPP: 650.0000 mg | Freq: Four times a day (QID) | RECTAL | Status: DC | PRN

## 2023-02-01 MED ORDER — ONDANSETRON HCL 4 MG PO TABS: 4.0000 mg | ORAL_TABLET | Freq: Four times a day (QID) | ORAL | Status: DC | PRN

## 2023-02-01 MED ORDER — NITROGLYCERIN 0.4 MG SL SUBL
0.4000 mg | SUBLINGUAL_TABLET | SUBLINGUAL | Status: AC | PRN
Start: 2023-02-01 — End: 2023-02-01
  Administered 2023-02-01 (×3): 0.4 mg via SUBLINGUAL
  Filled 2023-02-01 (×2): qty 1

## 2023-02-01 MED ORDER — CHLORHEXIDINE GLUCONATE CLOTH 2 % EX PADS
6.0000 | MEDICATED_PAD | Freq: Every day | CUTANEOUS | Status: DC
  Administered 2023-02-02: 6 via TOPICAL

## 2023-02-01 MED ORDER — INSULIN ASPART 100 UNIT/ML IJ SOLN
0.0000 [IU] | INTRAMUSCULAR | Status: DC
  Administered 2023-02-01: 1 [IU] via SUBCUTANEOUS
  Administered 2023-02-01: 2 [IU] via SUBCUTANEOUS
  Administered 2023-02-02 – 2023-02-03 (×3): 1 [IU] via SUBCUTANEOUS
  Filled 2023-02-01: qty 1

## 2023-02-01 NOTE — ED Notes (Signed)
Report given to and accepted by receiving nurse on unit 200 SD at [facility]

## 2023-02-01 NOTE — ED Provider Notes (Signed)
[place] EMERGENCY DEPARTMENT AT [facility] Provider Note   CSN: [id number] Arrival date & time: 02/01/23  4010     History  Chief Complaint  Patient presents with   Abdominal Pain    Trevor [name] is a 82 y.o. male.  82 yo M w/ h/o cad s/p stent in may, htn, DM, constipation who presents to the ED for epigastirc pain. States he has had it the last few days progressively worsening. Feels like if he could burp he would do better. No BM in two days but has flatulence that sometimes helps a bit. States it doesn't feel like his previous heart issues but that is one of the things he was worried about since the GERD meds didn't help. No nausea, dyspnea, diaphoresis, fever or cough.    Abdominal Pain      Home Medications Prior to Admission medications   Medication Sig Start Date End Date Taking? Authorizing Provider  ACCU-CHEK AVIVA PLUS test strip USE TO TEST BLOOD SUGAR BEFORE BREAKFAST AND AT BEDTIME 05/26/21   Roma Kayser, MD  Accu-Chek Softclix Lancets lancets  09/13/19   [provider]  acetaminophen (TYLENOL) 325 MG tablet Take 1-2 tablets (325-650 mg total) by mouth every 6 (six) hours as needed for mild pain (pain score 1-3 or temp > 100.5). 07/28/22   Meredeth Ide, MD  Alcohol Swabs (B-D SINGLE USE SWABS REGULAR) PADS  08/11/19   [provider]  amLODipine (NORVASC) 5 MG tablet Take 1 tablet (5 mg total) by mouth daily. 08/30/22   Alwyn Ren, MD  aspirin EC 81 MG tablet Take 1 tablet (81 mg total) by mouth daily with breakfast. Swallow whole. 08/30/22   Barrett, Joline Salt, PA-C  Blood Glucose Monitoring Suppl (ONETOUCH VERIO) w/Device KIT 1 each by Does not apply route as needed. Patient not taking: Reported on 01/08/2023 05/03/20   Roma Kayser, MD  carvedilol (COREG) 12.5 MG tablet Take 12.5 mg by mouth 2 (two) times daily with a meal. 10/18/19   [provider]  Cholecalciferol (VITAMIN D-3 PO) Take by mouth.     [provider]  CRANBERRY EXTRACT PO Take by mouth.    [provider]  hydrALAZINE (APRESOLINE) 100 MG tablet Take 50 mg by mouth 2 (two) times daily. 07/03/20   [provider]  insulin degludec (TRESIBA FLEXTOUCH) 200 UNIT/ML FlexTouch Pen 10-24 Units as needed (blood sugar). Sliding scale Under 100 Do Not Take Patient not taking: Reported on 01/08/2023    [provider]  lactulose (CHRONULAC) 10 GM/15ML solution Take 20 g by mouth daily as needed for moderate constipation. 08/21/22   [provider]  nitroGLYCERIN (NITROSTAT) 0.4 MG SL tablet Place 1 tablet (0.4 mg total) under the tongue every 5 (five) minutes as needed for chest pain. 08/29/22   Barrett, Joline Salt, PA-C  polyethylene glycol (MIRALAX / GLYCOLAX) 17 g packet Take 17 g by mouth daily as needed for mild constipation. Patient not taking: Reported on 01/08/2023 07/28/22   Meredeth Ide, MD  rosuvastatin (CRESTOR) 10 MG tablet Take 10 mg by mouth every evening. 01/26/17   [provider]  senna (SENOKOT) 8.6 MG TABS tablet Take 1 tablet (8.6 mg total) by mouth 2 (two) times daily. Patient not taking: Reported on 01/08/2023 07/28/22   Meredeth Ide, MD  ticagrelor (BRILINTA) 90 MG TABS tablet Take 1 tablet (90 mg total) by mouth 2 (two) times daily. 08/29/22   Barrett,  Joline Salt, PA-C      Allergies    Patient has no known allergies.    Review of Systems   Review of Systems  Gastrointestinal:  Positive for abdominal pain.    Physical Exam Updated Vital Signs BP (!) 160/74   Pulse 72   Temp 97.8 F (36.6 C) (Oral)   Resp 15   Ht 5\' 11"  (1.803 m)   Wt 90.7 kg   SpO2 97%   BMI 27.89 kg/m  Physical Exam Vitals and nursing note reviewed.  Constitutional:      Appearance: He is well-developed.  HENT:     Head: Normocephalic and atraumatic.  Cardiovascular:     Rate and Rhythm: Normal rate.  Pulmonary:     Effort: Pulmonary effort is normal. No respiratory distress.   Abdominal:     General: There is no distension.     Tenderness: There is abdominal tenderness (mild) in the epigastric area.  Musculoskeletal:        General: Normal range of motion.     Cervical back: Normal range of motion.  Skin:    General: Skin is warm and dry.  Neurological:     Mental Status: He is alert.     ED Results / Procedures / Treatments   Labs (all labs ordered are listed, but only abnormal results are displayed) Labs Reviewed  BASIC METABOLIC PANEL - Abnormal; Notable for the following components:      Result Value   Sodium 132 (*)    Chloride 97 (*)    Glucose, Bld 194 (*)    BUN 53 (*)    Creatinine, Ser 8.45 (*)    GFR, Estimated 6 (*)    All other components within normal limits  CBC - Abnormal; Notable for the following components:   WBC 12.8 (*)    RBC 4.01 (*)    Hemoglobin 12.3 (*)    HCT 37.2 (*)    All other components within normal limits  BRAIN NATRIURETIC PEPTIDE - Abnormal; Notable for the following components:   B Natriuretic Peptide 1,259.0 (*)    All other components within normal limits  TROPONIN I (HIGH SENSITIVITY) - Abnormal; Notable for the following components:   Troponin I (High Sensitivity) 1,079 (*)    All other components within normal limits  TROPONIN I (HIGH SENSITIVITY) - Abnormal; Notable for the following components:   Troponin I (High Sensitivity) 941 (*)    All other components within normal limits  HEPATIC FUNCTION PANEL  HEPARIN LEVEL (UNFRACTIONATED)    EKG EKG Interpretation Date/Time:  Monday February 01 2023 02:42:02 EDT Ventricular Rate:  78 PR Interval:  384 QRS Duration:  84 QT Interval:  364 QTC Calculation: 415 R Axis:   20  Text Interpretation: Sinus rhythm Prolonged PR interval Left atrial enlargement LVH with secondary repolarization abnormality Probable anterior infarct, age indeterminate LVH similar to previous now has ST changes in lateral leads that appear new but may be lead placement as the  QRS is not the same direction in V5, aVF Confirmed by Marily Memos [phone]) on 02/01/2023 3:44:56 AM  Radiology DG Chest Portable 1 View  Result Date: 02/01/2023 CLINICAL DATA:  Chest pain and shortness of breath. EXAM: PORTABLE CHEST 1 VIEW COMPARISON:  PA chest 08/27/2022 FINDINGS: There is mild cardiomegaly.  Central vessels are mildly prominent. There is increased mild interstitial consolidation with a basal gradient consistent with interstitial edema/fluid overload or CHF. No focal pneumonic infiltrate is seen. There are minimal  pleural effusions. The mediastinum is normally outlined. The aortic arch is heavily calcified. Thoracic spondylosis. No new osseous findings. Slight thoracic dextroscoliosis. IMPRESSION: 1. Cardiomegaly with increased mild interstitial consolidation with a basal gradient consistent with interstitial edema, findings consistent with fluid overload or CHF. 2. Minimal pleural effusions.  No focal consolidation. 3. Aortic atherosclerosis. Electronically Signed   By: Almira Bar M.D.   On: 02/01/2023 03:04    Procedures .Critical Care  Performed by: Marily Memos, MD Authorized by: Marily Memos, MD   Critical care provider statement:    Critical care time (minutes):  30   Critical care was necessary to treat or prevent imminent or life-threatening deterioration of the following conditions:  Cardiac failure   Critical care was time spent personally by me on the following activities:  Development of treatment plan with patient or surrogate, discussions with consultants, evaluation of patient's response to treatment, examination of patient, ordering and review of laboratory studies, ordering and review of radiographic studies, ordering and performing treatments and interventions, pulse oximetry, re-evaluation of patient's condition and review of old charts     Medications Ordered in ED Medications  heparin ADULT infusion 100 units/mL (25000 units/259mL) (1,000 Units/hr  Intravenous New Bag/Given 02/01/23 0444)  HYDROmorphone (DILAUDID) injection 1 mg (1 mg Intravenous Given 02/01/23 0347)  alum & mag hydroxide-simeth (MAALOX/MYLANTA) 200-200-20 MG/5ML suspension 30 mL (30 mLs Oral Given 02/01/23 0348)    And  lidocaine (XYLOCAINE) 2 % viscous mouth solution 15 mL (15 mLs Oral Given 02/01/23 0348)  pantoprazole (PROTONIX) injection 40 mg (40 mg Intravenous Given 02/01/23 0347)  famotidine (PEPCID) tablet 20 mg (20 mg Oral Given 02/01/23 0348)  nitroGLYCERIN (NITROSTAT) SL tablet 0.4 mg (0.4 mg Sublingual Given 02/01/23 0450)  aspirin chewable tablet 324 mg (324 mg Oral Given 02/01/23 0426)  heparin bolus via infusion 4,000 Units (4,000 Units Intravenous Bolus from Bag 02/01/23 0444)  fentaNYL (SUBLIMAZE) injection 50 mcg (50 mcg Intravenous Given 02/01/23 0606)    ED Course/ Medical Decision Making/ A&P                                 Medical Decision Making Amount and/or Complexity of Data Reviewed Labs: ordered.  Risk OTC drugs. Prescription drug management. Decision regarding hospitalization.  Seems GI in nature so will treat for that but also eval for cardiac causes with questionable ECG changes. Doubt full SBO but may  need ct scan if cardiac workup is reassuring.  First troponin > 1000, BNP >1000 which is just a bit above baseline however last NSTEMI his tropoin started in double digits and then rose so I suspect this is another NSTEMI that  likely started a couple days ago with his symptoms. ASA, Heparin, NTG, opiate for symptom control. Will d/w cardiology.   Discussed with cardiology. Will hold on transfer/admission at this time as long as pain is controlled.  Will see what second troponin is and then decide on [place] versus staying here to see cardiology.  Patient updated.  Second troponin a little bit less than first.  Mid 900s.  Pain still controlled.  Updated cardiology and will discuss with hospitalist here.  Gust with hospitalist  who thinks that maybe this happened a couple days ago as well there is no indication for catheterization at this time and and make sense to keep him here for dialysis and cardiology evaluation and if they want to transfer they can transfer  later.  Request that I speak with nephrology.  Gust with Dr. Gwenlyn Found, will get on list for dialysis.    Final Clinical Impression(s) / ED Diagnoses Final diagnoses:  NSTEMI (non-ST elevated myocardial infarction) [facility])    Rx / DC Orders ED Discharge Orders     None         Millie Shorb, Barbara Cower, MD 02/01/23 [phone]

## 2023-02-01 NOTE — TOC CM/SW Note (Signed)
Patient admitted to [facility]. TOC team at [facility] will continue to follow pt once arriving. TOC to follow.

## 2023-02-01 NOTE — Consult Note (Signed)
CARDIOLOGY CONSULT NOTE       Patient ID: Trevor [name] MRN: [id number] DOB/AGE: July 31, 1940 82 y.o.  Admit date: 02/01/2023 Referring Physician: Thomes Dinning Primary Physician: Benita Stabile, MD Primary Cardiologist: [facility] Reason for Consultation: Elevated troponin1  Principal Problem:   Congestive heart failure (CHF) (HCC) Active Problems:   Essential hypertension   Mixed hyperlipidemia   End-stage renal disease on hemodialysis (HCC)   Atypical chest pain   CAD S/P percutaneous coronary angioplasty   Elevated brain natriuretic peptide (BNP) level   NSTEMI (non-ST elevated myocardial infarction) (HCC)   Abdominal pain   HPI:  82 y.o. with history of HTN, HLD, DM-2 andeia and ESRD on dialysis. CAD discovered after SEMI with hip fracture surgery 07/23/22 Pain atypical with strong GI component and epigastric pain. [facility] over weekend to [facility] with similar presentation. Hospitalist discssed with fellow but not transferred troponin progressively elevated 216-> 722-> 1079.  Recurrent pain this am on heparin Today is dialysis day. Prior cath 08/28/22 resulted in proximal LAD stent with residual 70% right PLB lesion. TTE done 08/27/22 with normal EF 70-75% Hemodynamics currently stable   ROS All other systems reviewed and negative except as noted above  Past Medical History:  Diagnosis Date   Anemia    Chronic kidney disease    Stage 4?    Diabetes mellitus without complication (HCC)    Hepatitis    not sure what type - over 20 years ago   History of kidney stones    Hyperlipidemia    Hypertension     Family History  Problem Relation Age of Onset   Diabetes Mother    Diabetes Brother    Heart attack Brother    Colon cancer Son    Lung cancer Son     Social History   Socioeconomic History   Marital status: Married    Spouse name: Not on file   Number of children: Not on file   Years of education: Not on file   Highest education level: Not on file  Occupational History    Not on file  Tobacco Use   Smoking status: Never   Smokeless tobacco: Never  Vaping Use   Vaping status: Never Used  Substance and Sexual Activity   Alcohol use: Never   Drug use: Never   Sexual activity: Not on file  Other Topics Concern   Not on file  Social History Narrative   Not on file   Social Determinants of Health   Financial Resource Strain: Not on file  Food Insecurity: No Food Insecurity (08/27/2022)   Hunger Vital Sign    Worried About Running Out of Food in the Last Year: Never true    Ran Out of Food in the Last Year: Never true  Transportation Needs: No Transportation Needs (08/28/2022)   PRAPARE - Administrator, Civil Service (Medical): No    Lack of Transportation (Non-Medical): No  Physical Activity: Not on file  Stress: Not on file  Social Connections: Not on file  Intimate Partner Violence: Not At Risk (08/27/2022)   Humiliation, Afraid, Rape, and Kick questionnaire    Fear of Current or Ex-Partner: No    Emotionally Abused: No    Physically Abused: No    Sexually Abused: No    Past Surgical History:  Procedure Laterality Date   AV FISTULA PLACEMENT Left 08/09/2019   Procedure: LEFT ARM Basilic  ARTERIOVENOUS  FISTULA CREATION;  Surgeon: Maeola Harman, MD;  Location: [facility]  OR;  Service: Vascular;  Laterality: Left;   AV FISTULA PLACEMENT Left 12/17/2020   Procedure: INSERTION OF ARTERIOVENOUS (AV) GORE-TEX GRAFT LEFT ARM;  Surgeon: Maeola Harman, MD;  Location: [facility] OR;  Service: Vascular;  Laterality: Left;   BASCILIC VEIN TRANSPOSITION Left 11/30/2019   Procedure: LEFT SECOND STAGE BASCILIC VEIN TRANSPOSITION;  Surgeon: Maeola Harman, MD;  Location[facility] OR;  Service: Vascular;  Laterality: Left;   COLONOSCOPY N/A 07/07/2017   Procedure: COLONOSCOPY;  Surgeon: West Bali, MD;  Location: [facility] ENDO SUITE;  Service: Endoscopy;  Laterality: N/A;  8:30   CORONARY ATHERECTOMY N/A 08/28/2022   Procedure: CORONARY  ATHERECTOMY;  Surgeon: Orbie Pyo, MD;  Location: [facility] INVASIVE CV LAB;  Service: Cardiovascular;  Laterality: N/A;   CORONARY IMAGING/OCT N/A 08/28/2022   Procedure: CORONARY IMAGING/OCT;  Surgeon: Orbie Pyo, MD;  Location: [facility] INVASIVE CV LAB;  Service: Cardiovascular;  Laterality: N/A;   CORONARY PRESSURE/FFR STUDY N/A 08/28/2022   Procedure: CORONARY PRESSURE/FFR STUDY;  Surgeon: Orbie Pyo, MD;  Location: [facility] INVASIVE CV LAB;  Service: Cardiovascular;  Laterality: N/A;   CORONARY STENT INTERVENTION N/A 08/28/2022   Procedure: CORONARY STENT INTERVENTION;  Surgeon: Orbie Pyo, MD;  Location: [facility] INVASIVE CV LAB;  Service: Cardiovascular;  Laterality: N/A;   IR FLUORO GUIDE CV LINE RIGHT  09/06/2020   IR US GUIDE VASC ACCESS RIGHT  09/06/2020   LEFT HEART CATH AND CORONARY ANGIOGRAPHY N/A 08/28/2022   Procedure: LEFT HEART CATH AND CORONARY ANGIOGRAPHY;  Surgeon: Orbie Pyo, MD;  Location: [facility] INVASIVE CV LAB;  Service: Cardiovascular;  Laterality: N/A;   TOTAL HIP ARTHROPLASTY Right 10/09/2020   Procedure: TOTAL HIP ARTHROPLASTY ANTERIOR APPROACH;  Surgeon: [name], MD;  Location: [facility] OR;  Service: Orthopedics;  Laterality: Right;   TOTAL HIP ARTHROPLASTY Left 07/23/2022   Procedure: TOTAL HIP ARTHROPLASTY ANTERIOR APPROACH;  Surgeon: [name], MD;  Location: [facility] OR;  Service: Orthopedics;  Laterality: Left;      Current Facility-Administered Medications:    acetaminophen (TYLENOL) tablet 650 mg, 650 mg, Oral, Q6H PRN, 650 mg at 02/01/23 0815 **OR** acetaminophen (TYLENOL) suppository 650 mg, 650 mg, Rectal, Q6H PRN, Adefeso, Oladapo, DO   amLODipine (NORVASC) tablet 5 mg, 5 mg, Oral, Daily, Adefeso, Oladapo, DO   aspirin EC tablet 81 mg, 81 mg, Oral, Q breakfast, Adefeso, Oladapo, DO   carvedilol (COREG) tablet 12.5 mg, 12.5 mg, Oral, BID WC, Adefeso, Oladapo, DO   heparin ADULT infusion 100 units/mL (25000 units/272mL), 1,000 Units/hr, Intravenous,  Continuous, Adefeso, Oladapo, DO, Last Rate: 10 mL/hr at 02/01/23 0444, 1,000 Units/hr at 02/01/23 0444   hydrALAZINE (APRESOLINE) tablet 50 mg, 50 mg, Oral, BID, Adefeso, Oladapo, DO   insulin aspart (novoLOG) injection 0-6 Units, 0-6 Units, Subcutaneous, Q4H, Adefeso, Oladapo, DO   nitroGLYCERIN (NITROSTAT) SL tablet 0.4 mg, 0.4 mg, Sublingual, Q5 min PRN, Adefeso, Oladapo, DO   ondansetron (ZOFRAN) tablet 4 mg, 4 mg, Oral, Q6H PRN **OR** ondansetron (ZOFRAN) injection 4 mg, 4 mg, Intravenous, Q6H PRN, Adefeso, Oladapo, DO   pantoprazole (PROTONIX) injection 40 mg, 40 mg, Intravenous, Q24H, Adefeso, Oladapo, DO, 40 mg at 02/01/23 0815   rosuvastatin (CRESTOR) tablet 10 mg, 10 mg, Oral, QPM, Adefeso, Oladapo, DO   ticagrelor (BRILINTA) tablet 90 mg, 90 mg, Oral, BID, Adefeso, Oladapo, DO  Current Outpatient Medications:    ACCU-CHEK AVIVA PLUS test strip, USE TO TEST BLOOD SUGAR BEFORE BREAKFAST AND AT BEDTIME, Disp: 200 strip, Rfl: 2  Accu-Chek Softclix Lancets lancets, , Disp: , Rfl:    acetaminophen (TYLENOL) 325 MG tablet, Take 1-2 tablets (325-650 mg total) by mouth every 6 (six) hours as needed for mild pain (pain score 1-3 or temp > 100.5)., Disp: , Rfl:    Alcohol Swabs (B-D SINGLE USE SWABS REGULAR) PADS, , Disp: , Rfl:    amLODipine (NORVASC) 5 MG tablet, Take 1 tablet (5 mg total) by mouth daily., Disp: 30 tablet, Rfl: 2   aspirin EC 81 MG tablet, Take 1 tablet (81 mg total) by mouth daily with breakfast. Swallow whole., Disp: 30 tablet, Rfl: 12   Blood Glucose Monitoring Suppl (ONETOUCH VERIO) w/Device KIT, 1 each by Does not apply route as needed. (Patient not taking: Reported on 01/08/2023), Disp: 1 kit, Rfl: 0   carvedilol (COREG) 12.5 MG tablet, Take 12.5 mg by mouth 2 (two) times daily with a meal., Disp: , Rfl:    Cholecalciferol (VITAMIN D-3 PO), Take by mouth., Disp: , Rfl:    CRANBERRY EXTRACT PO, Take by mouth., Disp: , Rfl:    hydrALAZINE (APRESOLINE) 100 MG tablet, Take  50 mg by mouth 2 (two) times daily., Disp: , Rfl:    insulin degludec (TRESIBA FLEXTOUCH) 200 UNIT/ML FlexTouch Pen, 10-24 Units as needed (blood sugar). Sliding scale Under 100 Do Not Take (Patient not taking: Reported on 01/08/2023), Disp: , Rfl:    lactulose (CHRONULAC) 10 GM/15ML solution, Take 20 g by mouth daily as needed for moderate constipation., Disp: , Rfl:    nitroGLYCERIN (NITROSTAT) 0.4 MG SL tablet, Place 1 tablet (0.4 mg total) under the tongue every 5 (five) minutes as needed for chest pain., Disp: 25 tablet, Rfl: 3   polyethylene glycol (MIRALAX / GLYCOLAX) 17 g packet, Take 17 g by mouth daily as needed for mild constipation. (Patient not taking: Reported on 01/08/2023), Disp: 14 each, Rfl: 0   rosuvastatin (CRESTOR) 10 MG tablet, Take 10 mg by mouth every evening., Disp: , Rfl:    senna (SENOKOT) 8.6 MG TABS tablet, Take 1 tablet (8.6 mg total) by mouth 2 (two) times daily. (Patient not taking: Reported on 01/08/2023), Disp: 120 tablet, Rfl: 0   ticagrelor (BRILINTA) 90 MG TABS tablet, Take 1 tablet (90 mg total) by mouth 2 (two) times daily., Disp: 60 tablet, Rfl: 11  amLODipine  5 mg Oral Daily   aspirin EC  81 mg Oral Q breakfast   carvedilol  12.5 mg Oral BID WC   hydrALAZINE  50 mg Oral BID   insulin aspart  0-6 Units Subcutaneous Q4H   pantoprazole (PROTONIX) IV  40 mg Intravenous Q24H   rosuvastatin  10 mg Oral QPM   ticagrelor  90 mg Oral BID    heparin 1,000 Units/hr (02/01/23 0444)    Physical Exam: Blood pressure (!) 160/74, pulse 72, temperature 97.8 F (36.6 C), temperature source Oral, resp. rate 15, height 5\' 11"  (1.803 m), weight 90.7 kg, SpO2 97%.  Chronically ill black male Lungs clear SEM Fistula LUE with thrill No real abdominal pain Plus one edema  Labs:   Lab Results  Component Value Date   WBC 12.8 (H) 02/01/2023   HGB 12.3 (L) 02/01/2023   HCT 37.2 (L) 02/01/2023   MCV 92.8 02/01/2023   PLT 244 02/01/2023    Recent Labs  Lab  02/01/23 0241  NA 132*  K 4.1  CL 97*  CO2 22  BUN 53*  CREATININE 8.45*  CALCIUM 9.0  PROT 7.7  BILITOT 0.5  ALKPHOS 43  ALT 15  AST 17  GLUCOSE 194*   No results found for: "CKTOTAL", "CKMB", "CKMBINDEX", "TROPONINI"  Lab Results  Component Value Date   CHOL 130 02/15/2020   Lab Results  Component Value Date   HDL 46 02/15/2020   Lab Results  Component Value Date   LDLCALC 73 02/15/2020   Lab Results  Component Value Date   TRIG 46 02/15/2020   No results found for: "CHOLHDL" No results found for: "LDLDIRECT"    Radiology: DG Chest Portable 1 View  Result Date: 02/01/2023 CLINICAL DATA:  Chest pain and shortness of breath. EXAM: PORTABLE CHEST 1 VIEW COMPARISON:  PA chest 08/27/2022 FINDINGS: There is mild cardiomegaly.  Central vessels are mildly prominent. There is increased mild interstitial consolidation with a basal gradient consistent with interstitial edema/fluid overload or CHF. No focal pneumonic infiltrate is seen. There are minimal pleural effusions. The mediastinum is normally outlined. The aortic arch is heavily calcified. Thoracic spondylosis. No new osseous findings. Slight thoracic dextroscoliosis. IMPRESSION: 1. Cardiomegaly with increased mild interstitial consolidation with a basal gradient consistent with interstitial edema, findings consistent with fluid overload or CHF. 2. Minimal pleural effusions.  No focal consolidation. 3. Aortic atherosclerosis. Electronically Signed   By: Almira Bar M.D.   On: 02/01/2023 03:04    EKG: SR lateral ST depression T wave inversion   ASSESSMENT AND PLAN:   SEMI:  recurrent Had stent to LAD in May non revascularized PLB with lateral T wave changes on ECG Start iv nitroglycerin continue heparin. Transfer to [facility] Should consider repeat heart cath. TTE to assess for any new RWMA Continue coreg Still on DAT with Brilinta and ASA from prior stent  CRF:  good access LUE for dialysis today Dr Rich Reining  notified HTN:  continue norvasc  HLD continue statin  DM-2 :  insulin SS per primary service A1c pending   Signed: Charlton Haws 02/01/2023, 8:33 AM

## 2023-02-01 NOTE — Progress Notes (Signed)
PHARMACY - ANTICOAGULATION CONSULT NOTE  Pharmacy Consult for Heparin  Indication: chest pain/ACS  No Known Allergies  Patient Measurements: Height: 5\' 11"  (180.3 cm) Weight: 90.7 kg (200 lb) IBW/kg (Calculated) : 75.3  Vital Signs: Temp: 97.8 F (36.6 C) (10/21 0243) Temp Source: Oral (10/21 0243) BP: 163/76 (10/21 0432) Pulse Rate: 73 (10/21 0457)  Labs: Recent Labs    02/01/23 0241  HGB 12.3*  HCT 37.2*  PLT 244  CREATININE 8.45*  TROPONINIHS 1,079*    Estimated Creatinine Clearance: 7.9 mL/min (A) (by C-G formula based on SCr of 8.45 mg/dL (H)).   Medical History: Past Medical History:  Diagnosis Date   Anemia    Chronic kidney disease    Stage 4?    Diabetes mellitus without complication (HCC)    Hepatitis    not sure what type - over 20 years ago   History of kidney stones    Hyperlipidemia    Hypertension      Assessment: 82 y/o M with chest discomfort and elevated troponin, starting heparin, PTA meds reviewed, above labs reviewed.   Goal of Therapy:  Heparin level 0.3-0.7 units/ml Monitor platelets by anticoagulation protocol: Yes   Plan:  Heparin 4000 units BOLUS Start heparin drip at 1000 units/hr Heparin level in 8 hours Daily CBC/Heparin level Monitor for bleeding   Abran Duke, PharmD, BCPS Clinical Pharmacist Phone: [phone]

## 2023-02-01 NOTE — H&P (Signed)
History and Physical    Patient: Trevor [name] WJX:[id number] DOB: 26-Dec-1940 DOA: 02/01/2023 DOS: the patient was seen and examined on 02/01/2023 PCP: Benita Stabile, MD  Patient coming from: Home  Chief Complaint:  Chief Complaint  Patient presents with   Abdominal Pain   HPI: Trevor [name] is a 82 y.o. male with medical history significant of hypertension, hyperlipidemia, T2DM, ESRD on HD (MWF), anemia of chronic disease, CAD status post stent placement (May 2024) who presents to the emergency department due to 2 to 3 days of epigastric pain which he believes to be due to acid reflux.  Pain improves with belching.  Pain does not feel like previous heart attack, but he wanted to be sure, so he presented to the ED for further evaluation and management.  ED Course:  In the emergency department, BP was 146/67, other vital signs were within normal range.  Workup in the ED showed WBC 12.8, hemoglobin 12.3, hematocrit 37.2, MCV 92.8, platelets 244.  BMP showed sodium 132, potassium 4.1, chloride 97, bicarb 22 blood glucose 194, BUNs/creatinine 53/8.45.  Troponin I 1,079 > 941, BNP 1,259. Chest x-ray showed cardiomegaly with increased mild interstitial consolidation with a basal gradient consistent with interstitial edema, findings consistent with fluid overload or CHF. Pain medication, aspirin 324 mg x 1, Protonix, Pepcid, GI cocktail was provided.  Patient was started on heparin drip. Cardiologist on-call was consulted and recommended that patient can be admitted to [facility] and cardiology can see patient in the morning.  Review of Systems: Review of systems as noted in the HPI. All other systems reviewed and are negative.   Past Medical History:  Diagnosis Date   Anemia    Chronic kidney disease    Stage 4?    Diabetes mellitus without complication (HCC)    Hepatitis    not sure what type - over 20 years ago   History of kidney stones    Hyperlipidemia    Hypertension    Past  Surgical History:  Procedure Laterality Date   AV FISTULA PLACEMENT Left 08/09/2019   Procedure: LEFT ARM Basilic  ARTERIOVENOUS  FISTULA CREATION;  Surgeon: Maeola Harman, MD;  Location: [facility] OR;  Service: Vascular;  Laterality: Left;   AV FISTULA PLACEMENT Left 12/17/2020   Procedure: INSERTION OF ARTERIOVENOUS (AV) GORE-TEX GRAFT LEFT ARM;  Surgeon: Maeola Harman, MD;  Location: [facility] OR;  Service: Vascular;  Laterality: Left;   BASCILIC VEIN TRANSPOSITION Left 11/30/2019   Procedure: LEFT SECOND STAGE BASCILIC VEIN TRANSPOSITION;  Surgeon: Maeola Harman, MD;  Location: [facility] OR;  Service: Vascular;  Laterality: Left;   COLONOSCOPY N/A 07/07/2017   Procedure: COLONOSCOPY;  Surgeon: West Bali, MD;  Location: [facility] ENDO SUITE;  Service: Endoscopy;  Laterality: N/A;  8:30   CORONARY ATHERECTOMY N/A 08/28/2022   Procedure: CORONARY ATHERECTOMY;  Surgeon: Orbie Pyo, MD;  Location: [facility] INVASIVE CV LAB;  Service: Cardiovascular;  Laterality: N/A;   CORONARY IMAGING/OCT N/A 08/28/2022   Procedure: CORONARY IMAGING/OCT;  Surgeon: Orbie Pyo, MD;  Location: [facility] INVASIVE CV LAB;  Service: Cardiovascular;  Laterality: N/A;   CORONARY PRESSURE/FFR STUDY N/A 08/28/2022   Procedure: CORONARY PRESSURE/FFR STUDY;  Surgeon: Orbie Pyo, MD;  Location: [facility] INVASIVE CV LAB;  Service: Cardiovascular;  Laterality: N/A;   CORONARY STENT INTERVENTION N/A 08/28/2022   Procedure: CORONARY STENT INTERVENTION;  Surgeon: Orbie Pyo, MD;  Location: [facility] INVASIVE CV LAB;  Service: Cardiovascular;  Laterality: N/A;  IR FLUORO GUIDE CV LINE RIGHT  09/06/2020   IR US GUIDE VASC ACCESS RIGHT  09/06/2020   LEFT HEART CATH AND CORONARY ANGIOGRAPHY N/A 08/28/2022   Procedure: LEFT HEART CATH AND CORONARY ANGIOGRAPHY;  Surgeon: Orbie Pyo, MD;  Location: [facility] INVASIVE CV LAB;  Service: Cardiovascular;  Laterality: N/A;   TOTAL HIP ARTHROPLASTY Right 10/09/2020   Procedure: TOTAL HIP  ARTHROPLASTY ANTERIOR APPROACH;  Surgeon: [name], MD;  Location: [facility] OR;  Service: Orthopedics;  Laterality: Right;   TOTAL HIP ARTHROPLASTY Left 07/23/2022   Procedure: TOTAL HIP ARTHROPLASTY ANTERIOR APPROACH;  Surgeon: [name], MD;  Location: [facility] OR;  Service: Orthopedics;  Laterality: Left;    Social History:  reports that he has never smoked. He has never used smokeless tobacco. He reports that he does not drink alcohol and does not use drugs.   No Known Allergies  Family History  Problem Relation Age of Onset   Diabetes Mother    Diabetes Brother    Heart attack Brother    Colon cancer Son    Lung cancer Son      Prior to Admission medications   Medication Sig Start Date End Date Taking? Authorizing Provider  ACCU-CHEK AVIVA PLUS test strip USE TO TEST BLOOD SUGAR BEFORE BREAKFAST AND AT BEDTIME 05/26/21   Roma Kayser, MD  Accu-Chek Softclix Lancets lancets  09/13/19   [provider]  acetaminophen (TYLENOL) 325 MG tablet Take 1-2 tablets (325-650 mg total) by mouth every 6 (six) hours as needed for mild pain (pain score 1-3 or temp > 100.5). 07/28/22   Meredeth Ide, MD  Alcohol Swabs (B-D SINGLE USE SWABS REGULAR) PADS  08/11/19   [provider]  amLODipine (NORVASC) 5 MG tablet Take 1 tablet (5 mg total) by mouth daily. 08/30/22   Alwyn Ren, MD  aspirin EC 81 MG tablet Take 1 tablet (81 mg total) by mouth daily with breakfast. Swallow whole. 08/30/22   Barrett, Joline Salt, PA-C  Blood Glucose Monitoring Suppl (ONETOUCH VERIO) w/Device KIT 1 each by Does not apply route as needed. Patient not taking: Reported on 01/08/2023 05/03/20   Roma Kayser, MD  carvedilol (COREG) 12.5 MG tablet Take 12.5 mg by mouth 2 (two) times daily with a meal. 10/18/19   [provider]  Cholecalciferol (VITAMIN D-3 PO) Take by mouth.    [provider]  CRANBERRY EXTRACT PO Take by mouth.    [provider]  hydrALAZINE  (APRESOLINE) 100 MG tablet Take 50 mg by mouth 2 (two) times daily. 07/03/20   [provider]  insulin degludec (TRESIBA FLEXTOUCH) 200 UNIT/ML FlexTouch Pen 10-24 Units as needed (blood sugar). Sliding scale Under 100 Do Not Take Patient not taking: Reported on 01/08/2023    [provider]  lactulose (CHRONULAC) 10 GM/15ML solution Take 20 g by mouth daily as needed for moderate constipation. 08/21/22   [provider]  nitroGLYCERIN (NITROSTAT) 0.4 MG SL tablet Place 1 tablet (0.4 mg total) under the tongue every 5 (five) minutes as needed for chest pain. 08/29/22   Barrett, Joline Salt, PA-C  polyethylene glycol (MIRALAX / GLYCOLAX) 17 g packet Take 17 g by mouth daily as needed for mild constipation. Patient not taking: Reported on 01/08/2023 07/28/22   Meredeth Ide, MD  rosuvastatin (CRESTOR) 10 MG tablet Take 10 mg by mouth every evening. 01/26/17   [provider]  senna (SENOKOT) 8.6 MG TABS tablet Take 1 tablet (8.6  mg total) by mouth 2 (two) times daily. Patient not taking: Reported on 01/08/2023 07/28/22   Meredeth Ide, MD  ticagrelor (BRILINTA) 90 MG TABS tablet Take 1 tablet (90 mg total) by mouth 2 (two) times daily. 08/29/22   Barrett, Joline Salt, PA-C    Physical Exam: BP (!) 160/74   Pulse 72   Temp 97.8 F (36.6 C) (Oral)   Resp 15   Ht 5\' 11"  (1.803 m)   Wt 90.7 kg   SpO2 97%   BMI 27.89 kg/m   General: 82 y.o. year-old male ill appearing, but in no acute distress.  Alert and oriented x3. HEENT: NCAT, EOMI Neck: Supple, trachea medial Cardiovascular: Regular rate and rhythm with no rubs or gallops.  No thyromegaly or JVD noted.  No lower extremity edema. 2/4 pulses in all 4 extremities. Respiratory: Clear to auscultation with no wheezes or rales. Good inspiratory effort. Abdomen: Soft, tender to show the epigastric/subxiphoid.  Normal bowel sounds x4 quadrants. Muskuloskeletal: No cyanosis, clubbing or edema noted bilaterally Neuro: CN  II-XII intact, strength 5/5 x 4, sensation, reflexes intact Skin: No ulcerative lesions noted or rashes Psychiatry: Judgement and insight appear normal. Mood is appropriate for condition and setting          Labs on Admission:  Basic Metabolic Panel: Recent Labs  Lab 02/01/23 0241  NA 132*  K 4.1  CL 97*  CO2 22  GLUCOSE 194*  BUN 53*  CREATININE 8.45*  CALCIUM 9.0   Liver Function Tests: Recent Labs  Lab 02/01/23 0241  AST 17  ALT 15  ALKPHOS 43  BILITOT 0.5  PROT 7.7  ALBUMIN 3.8   No results for input(s): "LIPASE", "AMYLASE" in the last 168 hours. No results for input(s): "AMMONIA" in the last 168 hours. CBC: Recent Labs  Lab 02/01/23 0241  WBC 12.8*  HGB 12.3*  HCT 37.2*  MCV 92.8  PLT 244   Cardiac Enzymes: No results for input(s): "CKTOTAL", "CKMB", "CKMBINDEX", "TROPONINI" in the last 168 hours.  BNP (last 3 results) Recent Labs    08/27/22 1106 02/01/23 0241  BNP 867.0* 1,259.0*    ProBNP (last 3 results) No results for input(s): "PROBNP" in the last 8760 hours.  CBG: No results for input(s): "GLUCAP" in the last 168 hours.  Radiological Exams on Admission: DG Chest Portable 1 View  Result Date: 02/01/2023 CLINICAL DATA:  Chest pain and shortness of breath. EXAM: PORTABLE CHEST 1 VIEW COMPARISON:  PA chest 08/27/2022 FINDINGS: There is mild cardiomegaly.  Central vessels are mildly prominent. There is increased mild interstitial consolidation with a basal gradient consistent with interstitial edema/fluid overload or CHF. No focal pneumonic infiltrate is seen. There are minimal pleural effusions. The mediastinum is normally outlined. The aortic arch is heavily calcified. Thoracic spondylosis. No new osseous findings. Slight thoracic dextroscoliosis. IMPRESSION: 1. Cardiomegaly with increased mild interstitial consolidation with a basal gradient consistent with interstitial edema, findings consistent with fluid overload or CHF. 2. Minimal pleural  effusions.  No focal consolidation. 3. Aortic atherosclerosis. Electronically Signed   By: Almira Bar M.D.   On: 02/01/2023 03:04    EKG: I independently viewed the EKG done and my findings are as followed: Normal sinus rhythm at a rate of 78 bpm  Assessment/Plan Present on Admission:  Atypical chest pain  Essential hypertension  Mixed hyperlipidemia  Principal Problem:   Congestive heart failure (CHF) (HCC) Active Problems:   Atypical chest pain   End-stage renal disease on hemodialysis (HCC)  Essential hypertension   Mixed hyperlipidemia   CAD S/P percutaneous coronary angioplasty   Elevated brain natriuretic peptide (BNP) level   NSTEMI (non-ST elevated myocardial infarction) (HCC)   Abdominal pain  Congestive heart failure Chest x-ray was suggestive of CHF Continue total input/output, daily weights and fluid restriction Continue heart healthy/modified carb diet This is due to fluid overload.  Nephrology will be consulted for hemodialysis  Echocardiogram done in May 2024 showed LVEF greater than 75%.  No RWMA.  Moderate LVH.  LV diastolic parameters were normal.  Echocardiogram will be done in the morning in the morning   Elevated BNP  BNP 1,259 > 941; this was 722 in May 2024 This may be due to patient's fluid overload status in the setting of ESKD Continue management as described above  Atypical chest pain NSTEMI vs type II demand ischemia Cardiovascular risk factors include CAD s/p stent placement, hypertension, hyperlipidemia, T2DM Continue telemetry  Troponins x2 - 1,079 > 941 EKG showed sinus rhythm at a rate of 78 bpm Continue heparin drip Cardiology will be consulted to help decide if Stress test is needed in am Versus other  diagnostic modalities.    Aspirin 325 mg x 1 was given ; continue aspirin, nitroglycerin prn  ESRD on HD (MWF) Nephrology will be consulted and we shall await further recommendations  Abdominal pain IV Protonix 40 mg  daily Continue IV Zofran p.r.n.  Type 2 diabetes mellitus with hyperglycemia Hemoglobin A1c on/10/24 was 5.7 Continue ISS and hypoglycemia protocol  Essential hypertension (uncontrolled) Continue amlodipine, Coreg, hydralazine  Mixed hyperlipidemia Continue Crestor  CAD status post stent placement Continue aspirin, Brilinta, Crestor, Coreg  DVT prophylaxis: Heparin drip  Code Status: Full code  Consults: Cardiology, nephrology  Family Communication: None at bedside  Severity of Illness: The appropriate patient status for this patient is INPATIENT. Inpatient status is judged to be reasonable and necessary in order to provide the required intensity of service to ensure the patient's safety. The patient's presenting symptoms, physical exam findings, and initial radiographic and laboratory data in the context of their chronic comorbidities is felt to place them at high risk for further clinical deterioration. Furthermore, it is not anticipated that the patient will be medically stable for discharge from the hospital within 2 midnights of admission.   * I certify that at the point of admission it is my clinical judgment that the patient will require inpatient hospital care spanning beyond 2 midnights from the point of admission due to high intensity of service, high risk for further deterioration and high frequency of surveillance required.*  Author: Frankey Shown, DO 02/01/2023 7:32 AM  For on call review [email or web address].

## 2023-02-01 NOTE — ED Notes (Signed)
Date and time results received: 02/01/23 0411   Test: TROPONIN Critical Value: 1,079  Name of Provider Notified: Marily Memos, MD

## 2023-02-01 NOTE — ED Triage Notes (Signed)
Pt with c/o epigastric pain x 2-3 days. States he believes it's acid reflux. States he experiences some relief with "belching". States he wanted to make sure "it wasn't his heart" since he had an MI in June.

## 2023-02-01 NOTE — Consult Note (Signed)
[facility] Renal Consultation Note    Indication for Consultation:  Management of ESRD/hemodialysis; anemia, hypertension/volume and secondary hyperparathyroidism  HPI: Trevor [name] is a 82 y.o. male with a PMH significant for HTN, DMT2, HLD, CAD s/p stent (May 2024), anemia, and ESRD ([name]) who presented to [facility] ED on early 02/01/23 with 2-3 day history of epigastric pain.  He thinks it is reflux although the pain started after walking his dog.  In the ED, Temp 97.8, BP 160/74, HR 72, SpO2 97%.  Labs notable for WBC 12.8, Hgb 12.3, Na 132, K 4.1, gluc 194, troponin I 1079, BNP 1259.  CXR with cardiomegaly and mild increased interstitial edema. ECG with SR lateral ST depression and T wave inversion and admitted for MI.  We were consulted to provide dialysis during his hospitalization.  He was seen by cardiology who wants him to be transferred to [facility] for possible repeat heart cath.  Past Medical History:  Diagnosis Date   Anemia    Chronic kidney disease    Stage 4?    Diabetes mellitus without complication (HCC)    Hepatitis    not sure what type - over 20 years ago   History of kidney stones    Hyperlipidemia    Hypertension    Past Surgical History:  Procedure Laterality Date   AV FISTULA PLACEMENT Left 08/09/2019   Procedure: LEFT ARM Basilic  ARTERIOVENOUS  FISTULA CREATION;  Surgeon: Maeola Harman, MD;  Location: [facility] OR;  Service: Vascular;  Laterality: Left;   AV FISTULA PLACEMENT Left 12/17/2020   Procedure: INSERTION OF ARTERIOVENOUS (AV) GORE-TEX GRAFT LEFT ARM;  Surgeon: Maeola Harman, MD;  Location: [facility] OR;  Service: Vascular;  Laterality: Left;   BASCILIC VEIN TRANSPOSITION Left 11/30/2019   Procedure: LEFT SECOND STAGE BASCILIC VEIN TRANSPOSITION;  Surgeon: Maeola Harman, MD;  Location: [facility] OR;  Service: Vascular;  Laterality: Left;   COLONOSCOPY N/A 07/07/2017   Procedure: COLONOSCOPY;  Surgeon: West Bali,  MD;  Location: [facility];  Service: Endoscopy;  Laterality: N/A;  8:30   CORONARY ATHERECTOMY N/A 08/28/2022   Procedure: CORONARY ATHERECTOMY;  Surgeon: Orbie Pyo, MD;  Location: [facility] INVASIVE CV LAB;  Service: Cardiovascular;  Laterality: N/A;   CORONARY IMAGING/OCT N/A 08/28/2022   Procedure: CORONARY IMAGING/OCT;  Surgeon: Orbie Pyo, MD;  Location: [facility] INVASIVE CV LAB;  Service: Cardiovascular;  Laterality: N/A;   CORONARY PRESSURE/FFR STUDY N/A 08/28/2022   Procedure: CORONARY PRESSURE/FFR STUDY;  Surgeon: Orbie Pyo, MD;  Location: [facility] INVASIVE CV LAB;  Service: Cardiovascular;  Laterality: N/A;   CORONARY STENT INTERVENTION N/A 08/28/2022   Procedure: CORONARY STENT INTERVENTION;  Surgeon: Orbie Pyo, MD;  Location: [facility] INVASIVE CV LAB;  Service: Cardiovascular;  Laterality: N/A;   IR FLUORO GUIDE CV LINE RIGHT  09/06/2020   IR US GUIDE VASC ACCESS RIGHT  09/06/2020   LEFT HEART CATH AND CORONARY ANGIOGRAPHY N/A 08/28/2022   Procedure: LEFT HEART CATH AND CORONARY ANGIOGRAPHY;  Surgeon: Orbie Pyo, MD;  Location: [facility] INVASIVE CV LAB;  Service: Cardiovascular;  Laterality: N/A;   TOTAL HIP ARTHROPLASTY Right 10/09/2020   Procedure: TOTAL HIP ARTHROPLASTY ANTERIOR APPROACH;  Surgeon: [name], MD;  Location: [facility] OR;  Service: Orthopedics;  Laterality: Right;   TOTAL HIP ARTHROPLASTY Left 07/23/2022   Procedure: TOTAL HIP ARTHROPLASTY ANTERIOR APPROACH;  Surgeon: [name], MD;  Location: [facility] OR;  Service: Orthopedics;  Laterality: Left;   Family  History:   Family History  Problem Relation Age of Onset   Diabetes Mother    Diabetes Brother    Heart attack Brother    Colon cancer Son    Lung cancer Son    Social History:  reports that he has never smoked. He has never used smokeless tobacco. He reports that he does not drink alcohol and does not use drugs. No Known Allergies Prior to Admission medications   Medication Sig Start Date End Date Taking?  Authorizing Provider  ACCU-CHEK AVIVA PLUS test strip USE TO TEST BLOOD SUGAR BEFORE BREAKFAST AND AT BEDTIME 05/26/21   Roma Kayser, MD  Accu-Chek Softclix Lancets lancets  09/13/19   [provider]  acetaminophen (TYLENOL) 325 MG tablet Take 1-2 tablets (325-650 mg total) by mouth every 6 (six) hours as needed for mild pain (pain score 1-3 or temp > 100.5). 07/28/22   Meredeth Ide, MD  Alcohol Swabs (B-D SINGLE USE SWABS REGULAR) PADS  08/11/19   [provider]  amLODipine (NORVASC) 5 MG tablet Take 1 tablet (5 mg total) by mouth daily. 08/30/22   Alwyn Ren, MD  aspirin EC 81 MG tablet Take 1 tablet (81 mg total) by mouth daily with breakfast. Swallow whole. 08/30/22   Barrett, Joline Salt, PA-C  Blood Glucose Monitoring Suppl (ONETOUCH VERIO) w/Device KIT 1 each by Does not apply route as needed. Patient not taking: Reported on 01/08/2023 05/03/20   Roma Kayser, MD  carvedilol (COREG) 12.5 MG tablet Take 12.5 mg by mouth 2 (two) times daily with a meal. 10/18/19   [provider]  Cholecalciferol (VITAMIN D-3 PO) Take by mouth.    [provider]  CRANBERRY EXTRACT PO Take by mouth.    [provider]  hydrALAZINE (APRESOLINE) 100 MG tablet Take 50 mg by mouth 2 (two) times daily. 07/03/20   [provider]  insulin degludec (TRESIBA FLEXTOUCH) 200 UNIT/ML FlexTouch Pen 10-24 Units as needed (blood sugar). Sliding scale Under 100 Do Not Take Patient not taking: Reported on 01/08/2023    [provider]  lactulose (CHRONULAC) 10 GM/15ML solution Take 20 g by mouth daily as needed for moderate constipation. 08/21/22   [provider]  nitroGLYCERIN (NITROSTAT) 0.4 MG SL tablet Place 1 tablet (0.4 mg total) under the tongue every 5 (five) minutes as needed for chest pain. 08/29/22   Barrett, Joline Salt, PA-C  polyethylene glycol (MIRALAX / GLYCOLAX) 17 g packet Take 17 g by mouth daily as needed for mild  constipation. Patient not taking: Reported on 01/08/2023 07/28/22   Meredeth Ide, MD  rosuvastatin (CRESTOR) 10 MG tablet Take 10 mg by mouth every evening. 01/26/17   [provider]  senna (SENOKOT) 8.6 MG TABS tablet Take 1 tablet (8.6 mg total) by mouth 2 (two) times daily. Patient not taking: Reported on 01/08/2023 07/28/22   Meredeth Ide, MD  ticagrelor (BRILINTA) 90 MG TABS tablet Take 1 tablet (90 mg total) by mouth 2 (two) times daily. 08/29/22   Barrett, Joline Salt, PA-C   Current Facility-Administered Medications  Medication Dose Route Frequency Provider Last Rate Last Admin   acetaminophen (TYLENOL) tablet 650 mg  650 mg Oral Q6H PRN Adefeso, Oladapo, DO   650 mg at 02/01/23 0815   Or   acetaminophen (TYLENOL) suppository 650 mg  650 mg Rectal Q6H PRN Adefeso, Oladapo, DO       amLODipine (NORVASC) tablet 5 mg  5 mg Oral Daily  Frankey Shown, DO       aspirin EC tablet 81 mg  81 mg Oral Q breakfast Adefeso, Oladapo, DO       carvedilol (COREG) tablet 12.5 mg  12.5 mg Oral BID WC Adefeso, Oladapo, DO       heparin ADULT infusion 100 units/mL (25000 units/277mL)  1,000 Units/hr Intravenous Continuous Adefeso, Oladapo, DO 10 mL/hr at 02/01/23 0444 1,000 Units/hr at 02/01/23 0444   hydrALAZINE (APRESOLINE) tablet 50 mg  50 mg Oral BID Adefeso, Oladapo, DO       insulin aspart (novoLOG) injection 0-6 Units  0-6 Units Subcutaneous Q4H Adefeso, Oladapo, DO       nitroGLYCERIN (NITROSTAT) SL tablet 0.4 mg  0.4 mg Sublingual Q5 min PRN Adefeso, Oladapo, DO       ondansetron (ZOFRAN) tablet 4 mg  4 mg Oral Q6H PRN Adefeso, Oladapo, DO       Or   ondansetron (ZOFRAN) injection 4 mg  4 mg Intravenous Q6H PRN Adefeso, Oladapo, DO       pantoprazole (PROTONIX) injection 40 mg  40 mg Intravenous Q24H Adefeso, Oladapo, DO   40 mg at 02/01/23 0815   rosuvastatin (CRESTOR) tablet 10 mg  10 mg Oral QPM Adefeso, Oladapo, DO       ticagrelor (BRILINTA) tablet 90 mg  90 mg Oral BID Adefeso,  Oladapo, DO       Current Outpatient Medications  Medication Sig Dispense Refill   ACCU-CHEK AVIVA PLUS test strip USE TO TEST BLOOD SUGAR BEFORE BREAKFAST AND AT BEDTIME 200 strip 2   Accu-Chek Softclix Lancets lancets      acetaminophen (TYLENOL) 325 MG tablet Take 1-2 tablets (325-650 mg total) by mouth every 6 (six) hours as needed for mild pain (pain score 1-3 or temp > 100.5).     Alcohol Swabs (B-D SINGLE USE SWABS REGULAR) PADS      amLODipine (NORVASC) 5 MG tablet Take 1 tablet (5 mg total) by mouth daily. 30 tablet 2   aspirin EC 81 MG tablet Take 1 tablet (81 mg total) by mouth daily with breakfast. Swallow whole. 30 tablet 12   Blood Glucose Monitoring Suppl (ONETOUCH VERIO) w/Device KIT 1 each by Does not apply route as needed. (Patient not taking: Reported on 01/08/2023) 1 kit 0   carvedilol (COREG) 12.5 MG tablet Take 12.5 mg by mouth 2 (two) times daily with a meal.     Cholecalciferol (VITAMIN D-3 PO) Take by mouth.     CRANBERRY EXTRACT PO Take by mouth.     hydrALAZINE (APRESOLINE) 100 MG tablet Take 50 mg by mouth 2 (two) times daily.     insulin degludec (TRESIBA FLEXTOUCH) 200 UNIT/ML FlexTouch Pen 10-24 Units as needed (blood sugar). Sliding scale Under 100 Do Not Take (Patient not taking: Reported on 01/08/2023)     lactulose (CHRONULAC) 10 GM/15ML solution Take 20 g by mouth daily as needed for moderate constipation.     nitroGLYCERIN (NITROSTAT) 0.4 MG SL tablet Place 1 tablet (0.4 mg total) under the tongue every 5 (five) minutes as needed for chest pain. 25 tablet 3   polyethylene glycol (MIRALAX / GLYCOLAX) 17 g packet Take 17 g by mouth daily as needed for mild constipation. (Patient not taking: Reported on 01/08/2023) 14 each 0   rosuvastatin (CRESTOR) 10 MG tablet Take 10 mg by mouth every evening.     senna (SENOKOT) 8.6 MG TABS tablet Take 1 tablet (8.6 mg total) by mouth 2 (two) times daily. (  Patient not taking: Reported on 01/08/2023) 120 tablet 0   ticagrelor  (BRILINTA) 90 MG TABS tablet Take 1 tablet (90 mg total) by mouth 2 (two) times daily. 60 tablet 11   Labs: Basic Metabolic Panel: Recent Labs  Lab 02/01/23 0241 02/01/23 0719  NA 132*  --   K 4.1  --   CL 97*  --   CO2 22  --   GLUCOSE 194*  --   BUN 53*  --   CREATININE 8.45*  --   CALCIUM 9.0  --   PHOS  --  6.9*   Liver Function Tests: Recent Labs  Lab 02/01/23 0241  AST 17  ALT 15  ALKPHOS 43  BILITOT 0.5  PROT 7.7  ALBUMIN 3.8   No results for input(s): "LIPASE", "AMYLASE" in the last 168 hours. No results for input(s): "AMMONIA" in the last 168 hours. CBC: Recent Labs  Lab 02/01/23 0241  WBC 12.8*  HGB 12.3*  HCT 37.2*  MCV 92.8  PLT 244   Cardiac Enzymes: No results for input(s): "CKTOTAL", "CKMB", "CKMBINDEX", "TROPONINI" in the last 168 hours. CBG: Recent Labs  Lab 02/01/23 0803  GLUCAP 142*   Iron Studies: No results for input(s): "IRON", "TIBC", "TRANSFERRIN", "FERRITIN" in the last 72 hours. Studies/Results: DG Chest Portable 1 View  Result Date: 02/01/2023 CLINICAL DATA:  Chest pain and shortness of breath. EXAM: PORTABLE CHEST 1 VIEW COMPARISON:  PA chest 08/27/2022 FINDINGS: There is mild cardiomegaly.  Central vessels are mildly prominent. There is increased mild interstitial consolidation with a basal gradient consistent with interstitial edema/fluid overload or CHF. No focal pneumonic infiltrate is seen. There are minimal pleural effusions. The mediastinum is normally outlined. The aortic arch is heavily calcified. Thoracic spondylosis. No new osseous findings. Slight thoracic dextroscoliosis. IMPRESSION: 1. Cardiomegaly with increased mild interstitial consolidation with a basal gradient consistent with interstitial edema, findings consistent with fluid overload or CHF. 2. Minimal pleural effusions.  No focal consolidation. 3. Aortic atherosclerosis. Electronically Signed   By: Almira Bar M.D.   On: 02/01/2023 03:04    ROS: Pertinent  items are noted in HPI. Physical Exam: Vitals:   02/01/23 0457 02/01/23 0500 02/01/23 0530 02/01/23 0839  BP:  (!) 163/68 (!) 160/74   Pulse: 73 77 72   Resp: 14 18 15    Temp:    97.7 F (36.5 C)  TempSrc:    Oral  SpO2: 92% 93% 97%   Weight:      Height:          Weight change:  No intake or output data in the 24 hours ending 02/01/23 0935 BP (!) 160/74   Pulse 72   Temp 97.7 F (36.5 C) (Oral)   Resp 15   Ht 5\' 11"  (1.803 m)   Wt 90.7 kg   SpO2 97%   BMI 27.89 kg/m  General appearance: cooperative and no distress Head: Normocephalic, without obvious abnormality, atraumatic Resp: clear to auscultation bilaterally Cardio: regular rate and rhythm, S1, S2 normal, no murmur, click, rub or gallop GI: soft, non-tender; bowel sounds normal; no masses,  no organomegaly Extremities: extremities normal, atraumatic, no cyanosis or edema Dialysis Access: LUE AVF +T/B  Dialysis Orders: Center: [facility]  on MWF . EDW 89.5 HD Bath 2K/2.5Ca  Time 3:45 Heparin 500 units/hr. Access LU AVF BFR 400 DFR 500    Calcitriol 1.25 mcg po/HD  Micera 50 mcg every 2 weeks (last given 01/18/23)  Assessment/Plan:  SEMI - recurrent.  S/p stent to LAD in May.  Started on IV NTG and plan to transfer to [facility] for repeat heart cath.  TTE to assess for any new RWMA.  Continue with coreg, Brilinta, and asa  ESRD -  plan for HD after transfer to [facility]  Hypertension/volume  - evidence of volume overload.  UF as tolerated with HD today  Anemia  -  stable  Metabolic bone disease -   continue with home meds  Nutrition -  npo for now as he may require heart cath  Disposition - for transfer to [facility] as above  DM type 2 - per primary.  Irena Cords, MD [facility] 02/01/2023, 9:35 AM

## 2023-02-01 NOTE — Progress Notes (Signed)
PHARMACY - ANTICOAGULATION CONSULT NOTE  Pharmacy Consult for Heparin  Indication: chest pain/ACS  No Known Allergies  Patient Measurements: Height: 5\' 11"  (180.3 cm) Weight: 90.7 kg (200 lb) IBW/kg (Calculated) : 75.3  Vital Signs: Temp: 97.4 F (36.3 C) (10/21 1341) Temp Source: Oral (10/21 1341) BP: 130/94 (10/21 1415) Pulse Rate: 66 (10/21 1415)  Labs: Recent Labs    02/01/23 0241 02/01/23 0450 02/01/23 1430  HGB 12.3*  --   --   HCT 37.2*  --   --   PLT 244  --   --   HEPARINUNFRC  --   --  0.30  CREATININE 8.45*  --   --   TROPONINIHS 1,079* 941*  --     Estimated Creatinine Clearance: 7.9 mL/min (A) (by C-G formula based on SCr of 8.45 mg/dL (H)).   Medical History: Past Medical History:  Diagnosis Date   Anemia    Chronic kidney disease    Stage 4?    Diabetes mellitus without complication (HCC)    Hepatitis    not sure what type - over 20 years ago   History of kidney stones    Hyperlipidemia    Hypertension      Assessment: 82 y/o M with PMH including ESRD-HD and CAD s/p PCI in 08/2022. Presenting with chest discomfort and elevated troponin, starting heparin, PTA meds reviewed, above labs reviewed. ECG with lateral T wave changes. Patient on DAPT with ticagrelor and bASA from prior PCI.  Goal of Therapy:  Heparin level 0.3-0.7 units/ml Monitor platelets by anticoagulation protocol: Yes  10/21 1430 HL 0.30 Therapeutic x 1   Plan:  Continue heparin infusion at 1000 units/hr Confirmatory heparin level check in 8 hours Daily CBC for monitoring Hgb and PLT while on heparin infusion  Will M. Dareen Piano, PharmD Clinical Pharmacist 02/01/2023 3:37 PM

## 2023-02-01 NOTE — Progress Notes (Signed)
Per HPI: Trevor [name] is a 82 y.o. male with medical history significant of hypertension, hyperlipidemia, T2DM, ESRD on HD (MWF), anemia of chronic disease, CAD status post stent placement (May 2024) who presents to the emergency department due to 2 to 3 days of epigastric pain which he believes to be due to acid reflux.  Pain improves with belching.  Pain does not feel like previous heart attack, but he wanted to be sure, so he presented to the ED for further evaluation and management.   Patient seen and evaluated at bedside this morning and has been admitted after midnight.  Briefly, he has been admitted with concerns for NSTEMI and has been seen by cardiology with recommendations to continue heparin drip and use sublingual nitroglycerin as needed for chest pain.  Plan will be to transfer to [facility] for repeat heart catheterization and evaluate further with TTE.  Nephrology has also evaluated with plans for hemodialysis later today.  He continues to have some mild symptoms of indigestion and remains n.p.o. at this time.  A.m. labs ordered.  Total care time: 30 minutes.

## 2023-02-02 ENCOUNTER — Inpatient Hospital Stay (HOSPITAL_COMMUNITY): Payer: Medicare HMO

## 2023-02-02 ENCOUNTER — Other Ambulatory Visit: Payer: Self-pay

## 2023-02-02 ENCOUNTER — Ambulatory Visit: Payer: Medicare HMO | Admitting: Internal Medicine

## 2023-02-02 ENCOUNTER — Encounter (HOSPITAL_COMMUNITY)
Admission: EM | Disposition: A | Discharge status: Home / Self Care | Payer: Self-pay | Source: Home / Self Care | Attending: Internal Medicine

## 2023-02-02 DIAGNOSIS — I509 Heart failure, unspecified: Secondary | ICD-10-CM | POA: Diagnosis not present

## 2023-02-02 DIAGNOSIS — I5021 Acute systolic (congestive) heart failure: Secondary | ICD-10-CM | POA: Diagnosis not present

## 2023-02-02 DIAGNOSIS — I2510 Atherosclerotic heart disease of native coronary artery without angina pectoris: Secondary | ICD-10-CM | POA: Diagnosis not present

## 2023-02-02 HISTORY — PX: CORONARY LITHOTRIPSY: CATH118330

## 2023-02-02 HISTORY — PX: CORONARY STENT INTERVENTION: CATH118234

## 2023-02-02 HISTORY — PX: LEFT HEART CATH AND CORONARY ANGIOGRAPHY: CATH118249

## 2023-02-02 HISTORY — PX: CORONARY ULTRASOUND/IVUS: CATH118244

## 2023-02-02 LAB — GLUCOSE, CAPILLARY
Glucose-Capillary: 143 mg/dL — ABNORMAL HIGH (ref 70–99)
Glucose-Capillary: 142 mg/dL — ABNORMAL HIGH (ref 70–99)
Glucose-Capillary: 172 mg/dL — ABNORMAL HIGH (ref 70–99)
Glucose-Capillary: 156 mg/dL — ABNORMAL HIGH (ref 70–99)
Glucose-Capillary: 153 mg/dL — ABNORMAL HIGH (ref 70–99)
Glucose-Capillary: 192 mg/dL — ABNORMAL HIGH (ref 70–99)

## 2023-02-02 LAB — COMPREHENSIVE METABOLIC PANEL
ALT: 14 U/L (ref 0–44)
AST: 35 U/L (ref 15–41)
Albumin: 3.4 g/dL — ABNORMAL LOW (ref 3.5–5.0)
Alkaline Phosphatase: 34 U/L — ABNORMAL LOW (ref 38–126)
Anion gap: 17 — ABNORMAL HIGH (ref 5–15)
BUN: 56 mg/dL — ABNORMAL HIGH (ref 8–23)
CO2: 19 mmol/L — ABNORMAL LOW (ref 22–32)
Calcium: 8.9 mg/dL (ref 8.9–10.3)
Chloride: 101 mmol/L (ref 98–111)
Creatinine, Ser: 8.65 mg/dL — ABNORMAL HIGH (ref 0.61–1.24)
GFR, Estimated: 6 mL/min — ABNORMAL LOW (ref >60)
Glucose, Bld: 153 mg/dL — ABNORMAL HIGH (ref 70–99)
Potassium: 4.2 mmol/L (ref 3.5–5.1)
Sodium: 137 mmol/L (ref 135–145)
Total Bilirubin: 0.9 mg/dL (ref 0.3–1.2)
Total Protein: 7.2 g/dL (ref 6.5–8.1)

## 2023-02-02 LAB — CBC
HCT: 35.5 % — ABNORMAL LOW (ref 39.0–52.0)
Hemoglobin: 11.6 g/dL — ABNORMAL LOW (ref 13.0–17.0)
MCH: 30 pg (ref 26.0–34.0)
MCHC: 32.7 g/dL (ref 30.0–36.0)
MCV: 91.7 fL (ref 80.0–100.0)
Platelets: 232 10*3/uL (ref 150–400)
RBC: 3.87 MIL/uL — ABNORMAL LOW (ref 4.22–5.81)
RDW: 15.3 % (ref 11.5–15.5)
WBC: 16 10*3/uL — ABNORMAL HIGH (ref 4.0–10.5)
nRBC: 0 % (ref 0.0–0.2)

## 2023-02-02 LAB — POCT ACTIVATED CLOTTING TIME
Activated Clotting Time: 336 s
Activated Clotting Time: 433 s
Activated Clotting Time: 360 s
Activated Clotting Time: 238 s
Activated Clotting Time: 165 s
Activated Clotting Time: 189 s

## 2023-02-02 LAB — HEPARIN LEVEL (UNFRACTIONATED)
Heparin Unfractionated: 0.27 [IU]/mL — ABNORMAL LOW (ref 0.30–0.70)
Heparin Unfractionated: 0.29 [IU]/mL — ABNORMAL LOW (ref 0.30–0.70)

## 2023-02-02 LAB — ECHOCARDIOGRAM COMPLETE
AR max vel: 3.12 cm2
AV Area VTI: 3.37 cm2
AV Area mean vel: 3.15 cm2
AV Mean grad: 3 mm[Hg]
AV Peak grad: 4.7 mm[Hg]
Ao pk vel: 1.08 m/s
Area-P 1/2: 3.99 cm2
Height: 71 in
S' Lateral: 2.8 cm
Weight: 3294.55 [oz_av]

## 2023-02-02 LAB — HEPATITIS PANEL, ACUTE
HCV Ab: NONREACTIVE
Hep A IgM: NONREACTIVE
Hep B C IgM: NONREACTIVE
Hepatitis B Surface Ag: NONREACTIVE

## 2023-02-02 LAB — CREATININE, SERUM
Creatinine, Ser: 7.85 mg/dL — ABNORMAL HIGH (ref 0.61–1.24)
GFR, Estimated: 6 mL/min — ABNORMAL LOW (ref >60)

## 2023-02-02 LAB — HEPATITIS B SURFACE ANTIGEN: Hepatitis B Surface Ag: NONREACTIVE

## 2023-02-02 LAB — MAGNESIUM: Magnesium: 1.9 mg/dL (ref 1.7–2.4)

## 2023-02-02 SURGERY — LEFT HEART CATH AND CORONARY ANGIOGRAPHY
Anesthesia: LOCAL

## 2023-02-02 MED ORDER — SODIUM CHLORIDE 0.9% FLUSH: 3.0000 mL | Freq: Two times a day (BID) | INTRAVENOUS | Status: DC

## 2023-02-02 MED ORDER — METOPROLOL TARTRATE 5 MG/5ML IV SOLN
INTRAVENOUS | Status: DC | PRN
  Administered 2023-02-02: 2.5 mg via INTRAVENOUS

## 2023-02-02 MED ORDER — LIDOCAINE HCL (PF) 1 % IJ SOLN
INTRAMUSCULAR | Status: DC | PRN
  Administered 2023-02-02: 15 mL

## 2023-02-02 MED ORDER — VERAPAMIL HCL 2.5 MG/ML IV SOLN
INTRAVENOUS | Status: AC
  Filled 2023-02-02: qty 2

## 2023-02-02 MED ORDER — PERFLUTREN LIPID MICROSPHERE
1.0000 mL | INTRAVENOUS | Status: AC | PRN
  Administered 2023-02-02: 5 mL via INTRAVENOUS

## 2023-02-02 MED ORDER — NITROGLYCERIN 1 MG/10 ML FOR IR/CATH LAB
INTRA_ARTERIAL | Status: AC
  Filled 2023-02-02: qty 10

## 2023-02-02 MED ORDER — HEPARIN SODIUM (PORCINE) 1000 UNIT/ML IJ SOLN
INTRAMUSCULAR | Status: AC
  Filled 2023-02-02: qty 10

## 2023-02-02 MED ORDER — HYDRALAZINE HCL 20 MG/ML IJ SOLN: 10.0000 mg | INTRAMUSCULAR | Status: AC | PRN

## 2023-02-02 MED ORDER — FENTANYL CITRATE (PF) 100 MCG/2ML IJ SOLN
INTRAMUSCULAR | Status: AC
  Filled 2023-02-02: qty 2

## 2023-02-02 MED ORDER — HEPARIN SODIUM (PORCINE) 1000 UNIT/ML IJ SOLN
INTRAMUSCULAR | Status: DC | PRN
  Administered 2023-02-02: 10000 [IU] via INTRAVENOUS

## 2023-02-02 MED ORDER — LIDOCAINE HCL (PF) 1 % IJ SOLN
INTRAMUSCULAR | Status: AC
Start: 2023-02-02 — End: ?
  Filled 2023-02-02: qty 30

## 2023-02-02 MED ORDER — LABETALOL HCL 5 MG/ML IV SOLN
INTRAVENOUS | Status: DC | PRN
  Administered 2023-02-02: 10 mg via INTRAVENOUS

## 2023-02-02 MED ORDER — NITROGLYCERIN 1 MG/10 ML FOR IR/CATH LAB
INTRA_ARTERIAL | Status: DC | PRN
  Administered 2023-02-02: 200 ug via INTRACORONARY
  Administered 2023-02-02: 150 ug via INTRACORONARY
  Administered 2023-02-02: 200 ug via INTRACORONARY

## 2023-02-02 MED ORDER — LABETALOL HCL 5 MG/ML IV SOLN: 10.0000 mg | INTRAVENOUS | Status: AC | PRN

## 2023-02-02 MED ORDER — ACETAMINOPHEN 325 MG PO TABS
650.0000 mg | ORAL_TABLET | ORAL | Status: DC | PRN
Start: 2023-02-02 — End: 2023-02-04
  Administered 2023-02-02: 650 mg via ORAL

## 2023-02-02 MED ORDER — MIDAZOLAM HCL 2 MG/2ML IJ SOLN
INTRAMUSCULAR | Status: AC
  Filled 2023-02-02: qty 2

## 2023-02-02 MED ORDER — FENTANYL CITRATE (PF) 100 MCG/2ML IJ SOLN
INTRAMUSCULAR | Status: DC | PRN
  Administered 2023-02-02: 25 ug via INTRAVENOUS
  Administered 2023-02-02: 50 ug via INTRAVENOUS
  Administered 2023-02-02 (×2): 25 ug via INTRAVENOUS

## 2023-02-02 MED ORDER — SODIUM CHLORIDE 0.9% FLUSH
3.0000 mL | INTRAVENOUS | Status: DC | PRN
Start: 2023-02-02 — End: 2023-02-02

## 2023-02-02 MED ORDER — SODIUM CHLORIDE 0.9 % IV SOLN: 250.0000 mL | INTRAVENOUS | Status: DC | PRN

## 2023-02-02 MED ORDER — OXYCODONE HCL 5 MG PO TABS
5.0000 mg | ORAL_TABLET | ORAL | Status: DC | PRN
  Administered 2023-02-02 (×2): 5 mg via ORAL
  Filled 2023-02-02 (×2): qty 1

## 2023-02-02 MED ORDER — HEPARIN SODIUM (PORCINE) 5000 UNIT/ML IJ SOLN
5000.0000 [IU] | Freq: Three times a day (TID) | INTRAMUSCULAR | Status: DC
  Administered 2023-02-03: 5000 [IU] via SUBCUTANEOUS
  Filled 2023-02-02: qty 1

## 2023-02-02 MED ORDER — LABETALOL HCL 5 MG/ML IV SOLN
INTRAVENOUS | Status: AC
  Filled 2023-02-02: qty 4

## 2023-02-02 MED ORDER — MIDAZOLAM HCL 2 MG/2ML IJ SOLN
INTRAMUSCULAR | Status: DC | PRN
  Administered 2023-02-02 (×2): 1 mg via INTRAVENOUS

## 2023-02-02 MED ORDER — HEPARIN (PORCINE) IN NACL 1000-0.9 UT/500ML-% IV SOLN
INTRAVENOUS | Status: DC | PRN
  Administered 2023-02-02 (×2): 500 mL

## 2023-02-02 MED ORDER — PHENYLEPHRINE 80 MCG/ML (10ML) SYRINGE FOR IV PUSH (FOR BLOOD PRESSURE SUPPORT)
PREFILLED_SYRINGE | INTRAVENOUS | Status: DC | PRN
  Administered 2023-02-02: 80 ug via INTRAVENOUS

## 2023-02-02 MED ORDER — ACETAMINOPHEN 325 MG PO TABS
ORAL_TABLET | ORAL | Status: AC
  Filled 2023-02-02: qty 2

## 2023-02-02 MED ORDER — CALCITRIOL 0.5 MCG PO CAPS
1.2500 ug | ORAL_CAPSULE | ORAL | Status: DC
  Administered 2023-02-03: 1.25 ug via ORAL
  Filled 2023-02-02: qty 1

## 2023-02-02 SURGICAL SUPPLY — 24 items
BALLN SAPPHIRE 2.0X12 (BALLOONS) ×1
BALLN ~~LOC~~ EMERGE MR 3.25X12 (BALLOONS) ×1
BALLN ~~LOC~~ EUPHORA RX 3.0X15 (BALLOONS) ×2
BALLOON SAPPHIRE 2.0X12 (BALLOONS) IMPLANT
BALLOON ~~LOC~~ EMERGE MR 3.25X12 (BALLOONS) IMPLANT
BALLOON ~~LOC~~ EUPHORA RX 3.0X15 (BALLOONS) IMPLANT
CATH INFINITI 5FR MULTPACK ANG (CATHETERS) IMPLANT
CATH LAUNCHER 6FR EBU3.5 (CATHETERS) IMPLANT
CATH OPTICROSS HD (CATHETERS) IMPLANT
CATH SHOCKWAVE C2 3.0X12 (CATHETERS) IMPLANT
ELECT DEFIB PAD ADLT CADENCE (PAD) IMPLANT
KIT ENCORE 26 ADVANTAGE (KITS) IMPLANT
KIT HEMO VALVE WATCHDOG (MISCELLANEOUS) IMPLANT
KIT MICROPUNCTURE NIT STIFF (SHEATH) IMPLANT
KIT SINGLE USE MANIFOLD (KITS) IMPLANT
MAT PREVALON FULL STRYKER (MISCELLANEOUS) IMPLANT
PACK CARDIAC CATHETERIZATION (CUSTOM PROCEDURE TRAY) ×1 IMPLANT
SET ATX-X65L (MISCELLANEOUS) IMPLANT
SHEATH PINNACLE 5F 10CM (SHEATH) IMPLANT
SHEATH PINNACLE 6F 10CM (SHEATH) IMPLANT
SLED PULL BACK IVUS (MISCELLANEOUS) IMPLANT
STENT ONYX FRONTIER 3.0X18 (Permanent Stent) IMPLANT
WIRE ASAHI PROWATER 180CM (WIRE) IMPLANT
WIRE EMERALD 3MM-J .035X150CM (WIRE) IMPLANT

## 2023-02-02 NOTE — Progress Notes (Signed)
PHARMACY - ANTICOAGULATION CONSULT NOTE  Pharmacy Consult for Heparin  Indication: chest pain/ACS  No Known Allergies  Patient Measurements: Height: 5\' 11"  (180.3 cm) Weight: 93.4 kg (205 lb 14.6 oz) IBW/kg (Calculated) : 75.3  Vital Signs: Temp: 98.1 F (36.7 C) (10/21 2301) Temp Source: Oral (10/21 2301) BP: 136/78 (10/21 2345) Pulse Rate: 86 (10/21 2345)  Labs: Recent Labs    02/01/23 0241 02/01/23 0450 02/01/23 1430 02/01/23 2321  HGB 12.3*  --   --  11.6*  HCT 37.2*  --   --  35.5*  PLT 244  --   --  232  HEPARINUNFRC  --   --  0.30  --   CREATININE 8.45*  --   --   --   TROPONINIHS 1,079* 941*  --   --     Estimated Creatinine Clearance: 8 mL/min (A) (by C-G formula based on SCr of 8.45 mg/dL (H)).   Medical History: Past Medical History:  Diagnosis Date   Anemia    Chronic kidney disease    Stage 4?    Diabetes mellitus without complication (HCC)    Hepatitis    not sure what type - over 20 years ago   History of kidney stones    Hyperlipidemia    Hypertension      Assessment: 82 y/o M with chest discomfort and elevated troponin, starting heparin, PTA meds reviewed, above labs reviewed.   10/22 AM update:  Heparin level 0.27--not crossing into EPIC due to [employer] transfer--showing in Power BI report  Goal of Therapy:  Heparin level 0.3-0.7 units/ml Monitor platelets by anticoagulation protocol: Yes   Plan:  Inc heparin to 1100 units/hr Re-check heparin level in 8 hours  Abran Duke, PharmD, BCPS Clinical Pharmacist Phone: [phone]

## 2023-02-02 NOTE — Progress Notes (Signed)
   02/02/23 0215  Vitals  Temp 97.6 F (36.4 C)  Temp Source Oral  BP 123/79  BP Location Right Arm  BP Method Automatic  Patient Position (if appropriate) Lying  Pulse Rate 79  Pulse Rate Source Monitor  ECG Heart Rate 81  Resp (!) 21  Oxygen Therapy  SpO2 99 %  During Treatment Monitoring  Blood Flow Rate (mL/min) 300 mL/min  Arterial Pressure (mmHg) -127.27 mmHg  Venous Pressure (mmHg) 195.14 mmHg  TMP (mmHg) 17.98 mmHg  Ultrafiltration Rate (mL/min) 654 mL/min  Dialysate Flow Rate (mL/min) 300 ml/min  Dialysate Potassium Concentration 3  Dialysate Calcium Concentration 2.5  Duration of HD Treatment -hour(s) 2.66 hour(s)  Cumulative Fluid Removed (mL) per Treatment  1974.61  HD Safety Checks Performed Yes  Intra-Hemodialysis Comments Tx completed  Post Treatment  Dialyzer Clearance Lightly streaked  Liters Processed 63.9  Fluid Removed (mL) 2000 mL  Tolerated HD Treatment Yes  AVG/AVF Arterial Site Held (minutes) 10 minutes  AVG/AVF Venous Site Held (minutes) 10 minutes   Pt c/o back pain, tylenol 650mg  PO. Pt sign off treatment early. Hand off to Tama Gander RN

## 2023-02-02 NOTE — Progress Notes (Signed)
[facility] Progress Note    Assessment/ Plan:    NSTEMI - recurrent.  S/p stent to LAD in May.  Cardiology following. Plan for LHC today  ESRD -  HD on MWF sched  Hypertension/volume  - UF as tolerated with HD  Anemia  -  stable, hgb 11.6  Metabolic bone disease -   continue with home meds  Nutrition -  npo for now as he may require heart cath  DM type 2 - per primary.  Outpatient Dialysis Orders: Center: [facility]   on MWF . EDW 89.5 HD Bath 2K/2.5Ca  Time 3:45 Heparin 500 units/hr. Access LU AVF BFR 400 DFR 500    Calcitriol 1.25 mcg po/HD  Micera 50 mcg every 2 weeks (last given 01/18/23)  Anthony Sar, MD [facility]   Subjective:   Patient seen and examined bedside.  He reports that he tolerated dialysis overnight.  Net UF 2 L.  Still reports ongoing chest pain (described as soreness, exacerbated by movement) and indigestion.   Objective:   BP (!) 147/58 (BP Location: Right Arm)   Pulse 86   Temp 97.6 F (36.4 C) (Oral)   Resp 17   Ht 5\' 11"  (1.803 m)   Wt 93.4 kg   SpO2 96%   BMI 28.72 kg/m   Intake/Output Summary (Last 24 hours) at 02/02/2023 5409 Last data filed at 02/02/2023 0801 Gross per 24 hour  Intake 308.67 ml  Output 2000 ml  Net -1691.33 ml   Weight change: 2.68 kg  Physical Exam: Gen: NAD, laying flat in bed  CVS: RRR Resp: CTA B/L Abd: soft Ext: no sig edema b/l Les Neuro: awake, alert Dialysis access: LUE AVF +b/t  Imaging: DG Chest Portable 1 View  Result Date: 02/01/2023 CLINICAL DATA:  Chest pain and shortness of breath. EXAM: PORTABLE CHEST 1 VIEW COMPARISON:  PA chest 08/27/2022 FINDINGS: There is mild cardiomegaly.  Central vessels are mildly prominent. There is increased mild interstitial consolidation with a basal gradient consistent with interstitial edema/fluid overload or CHF. No focal pneumonic infiltrate is seen. There are minimal pleural effusions. The mediastinum is normally outlined.  The aortic arch is heavily calcified. Thoracic spondylosis. No new osseous findings. Slight thoracic dextroscoliosis. IMPRESSION: 1. Cardiomegaly with increased mild interstitial consolidation with a basal gradient consistent with interstitial edema, findings consistent with fluid overload or CHF. 2. Minimal pleural effusions.  No focal consolidation. 3. Aortic atherosclerosis. Electronically Signed   By: Almira Bar M.D.   On: 02/01/2023 03:04    Labs: BMET Recent Labs  Lab 02/01/23 0241 02/01/23 0719 02/01/23 2321  NA 132*  --  137  K 4.1  --  4.2  CL 97*  --  101  CO2 22  --  19*  GLUCOSE 194*  --  153*  BUN 53*  --  56*  CREATININE 8.45*  --  8.65*  CALCIUM 9.0  --  8.9  PHOS  --  6.9*  --    CBC Recent Labs  Lab 02/01/23 0241 02/01/23 2321  WBC 12.8* 16.0*  HGB 12.3* 11.6*  HCT 37.2* 35.5*  MCV 92.8 91.7  PLT 244 232    Medications:     amLODipine  5 mg Oral Daily   aspirin EC  81 mg Oral Q breakfast   carvedilol  12.5 mg Oral BID WC   Chlorhexidine Gluconate Cloth  6 each Topical Q0600   hydrALAZINE  50 mg Oral BID   insulin aspart  0-6  Units Subcutaneous Q4H   pantoprazole (PROTONIX) IV  40 mg Intravenous Q24H   rosuvastatin  10 mg Oral QPM   ticagrelor  90 mg Oral BID      Anthony Sar, MD [facility] 02/02/2023, 9:08 AM

## 2023-02-02 NOTE — Progress Notes (Signed)
SITE AREA: right groin/femoral  SITE PRIOR TO REMOVAL:  LEVEL 0  PRESSURE APPLIED FOR: approximately 30 minutes  MANUAL: yes  PATIENT STATUS DURING PULL: stable  POST PULL SITE:  LEVEL 0  POST PULL INSTRUCTIONS GIVEN: yes, drsg x 24 hours, may shower in 24 hours, no sitting in water, hot tubs, bathtubs, or pools x 1 week, no running, jumping etc, take it easy on steps and climbing into and out of vehicles   POST PULL PULSES PRESENT: +1 or dopplerable right pedal pulse, left pedal pulse at +2  DRESSING APPLIED: gauze with tegaderm  BEDREST BEGINS @ 1706  COMMENTS:SITE AREA:

## 2023-02-02 NOTE — Progress Notes (Signed)
PROGRESS NOTE  Trevor [name]  DOB: [birth year]  PCP: Benita Stabile, MD NWG:[id number]  DOA: 02/01/2023  LOS: 1 day  Hospital Day: 2  Brief narrative: Trevor [name] is a 82 y.o. male with PMH significant for ESRD-HD-MWF, DM2, HTN, HLD, CAD/stent 5/24, chronic anemia 10/21, patient presented to ED at [facility] with 2 to 3 days of abdominal pain.  Workup in the ED showed Troponin elevated to >1000, BNP elevated to over 1200 Chest x-ray with cardiomegaly, mild increased interstitial edema EKG with ST depression and T wave inversion  Heparin drip was initiated Patient was transferred to [facility] to be seen by cardiology Nephrology consultation was obtained as well.  Subjective: Patient was seen and examined this morning.  Not in distress Remains afebrile, hemodynamically stable. This morning, patient complained of ongoing squeezing chest pain and abdominal discomfort. Seen by cardiology.  Planned for cardiac cath today Chart reviewed Afebrile, hemodynamically stable Most recent labs was from last night with WC count 16, hemoglobin 11.6, potassium 4.2,  Assessment and plan: Non-ST elevation MI Presented with chest an abdominal discomfort  Troponin trend as below. Was initiated on heparin drip.  Pending cardiac cath today Recent Labs    02/01/23 0241 02/01/23 0450  TROPONINIHS 1,079* 941*   CAD/ stents May 2024 HLD PTA meds aspirin, Brilinta, Crestor, Coreg Currently continued on all  Volume overload status Congestive heart failure ESRD-HD-MWF Hypertension Chest x-ray at presentation suggestive of CHF Nephrology consulted Dialysis on schedule. Continue Coreg, amlodipine. Continue to monitor blood pressure.  IV hydralazine as needed  Type 2 diabetes mellitus A1c 5.3 on 02/01/2023 PTA meds-none Currently SSI/Accu-Cheks  Recent Labs  Lab 02/01/23 2037 02/02/23 0427 02/02/23 0736 02/02/23 1046 02/02/23 1349  GLUCAP 162* 143* 142* 172* 156*     Abdominal pain IV Protonix 40 mg daily Continue IV Zofran p.r.n.   Mobility: Encourage ambulation  Goals of care   Code Status: Full Code     DVT prophylaxis:  heparin injection 5,000 Units Start: 02/03/23 0600 SCDs Start: 02/01/23 5784   Antimicrobials: None Fluid: None Consultants: Cardiology, nephrology Family Communication: None at bedside  Status: Inpatient Level of care:  Telemetry Cardiac   Patient is from: Home Needs to continue in-hospital care: Pending cardiac cath today Anticipated d/c to: Pending clinical course   Diet:  Diet Order             Diet NPO time specified Except for: Sips with Meds, Ice Chips  Diet effective now                   Scheduled Meds:  acetaminophen       [MAR Hold] amLODipine  5 mg Oral Daily   [MAR Hold] aspirin EC  81 mg Oral Q breakfast   [MAR Hold] carvedilol  12.5 mg Oral BID WC   [MAR Hold] Chlorhexidine Gluconate Cloth  6 each Topical Q0600   [START ON 02/03/2023] heparin  5,000 Units Subcutaneous Q8H   [MAR Hold] hydrALAZINE  50 mg Oral BID   [MAR Hold] insulin aspart  0-6 Units Subcutaneous Q4H   nitroGLYCERIN       [MAR Hold] pantoprazole (PROTONIX) IV  40 mg Intravenous Q24H   [MAR Hold] rosuvastatin  10 mg Oral QPM   [MAR Hold] ticagrelor  90 mg Oral BID    PRN meds: acetaminophen, [MAR Hold] acetaminophen **OR** [MAR Hold] acetaminophen, acetaminophen, fentaNYL, Heparin (Porcine) in NaCl, heparin sodium (porcine), hydrALAZINE, labetalol, labetalol, lidocaine (PF), metoprolol tartrate,  midazolam, [MAR Hold] nitroGLYCERIN, nitroGLYCERIN, nitroGLYCERIN, [MAR Hold] ondansetron **OR** [MAR Hold] ondansetron (ZOFRAN) IV, [MAR Hold] oxyCODONE, phenylephrine   Infusions:   heparin Stopped (02/02/23 1050)    Antimicrobials: Anti-infectives (From admission, onward)    None       Objective: Vitals:   02/02/23 1445 02/02/23 1500  BP: (!) 173/68 (!) 162/88  Pulse: 73 73  Resp: 10 15  Temp:    SpO2:  96% 92%    Intake/Output Summary (Last 24 hours) at 02/02/2023 1525 Last data filed at 02/02/2023 0801 Gross per 24 hour  Intake 308.67 ml  Output 2000 ml  Net -1691.33 ml   Filed Weights   02/01/23 0239 02/01/23 2301  Weight: 90.7 kg 93.4 kg   Weight change: 2.68 kg Body mass index is 28.72 kg/m.   Physical Exam: General exam: Pleasant, in distress from chest pain Skin: No rashes, lesions or ulcers. HEENT: Atraumatic, normocephalic, no obvious bleeding Lungs: Clear to auscultation bilaterally CVS: Regular rate and rhythm, no murmur GI/Abd soft, nontender, nondistended, bowel sound present CNS: Alert, awake, oriented x 3 Psychiatry: Mood appropriate Extremities: No pedal edema, no peripheral  Data Review: I have personally reviewed the laboratory data and studies available.  F/u labs ordered Unresulted Labs (From admission, onward)     Start     Ordered   02/03/23 0500  CBC  Daily,   R      02/02/23 0020   02/03/23 0500  Lipid panel  Tomorrow morning,   R        02/02/23 0851   02/03/23 0500  TSH  Tomorrow morning,   R        02/02/23 0852   02/03/23 0500  Heparin level (unfractionated)  Daily,   R      02/02/23 0912   02/02/23 1321  Creatinine, serum  (heparin)  Once,   R       Comments: Baseline for heparin therapy IF NOT ALREADY DRAWN.   Question:  Specimen collection method  Answer:  Lab=Lab collect   02/02/23 1321   02/01/23 1948  Hepatitis B surface antibody,quantitative  (New Admission Hemo Labs (Hepatitis B))  Once,   R        02/01/23 1949   Signed and Held  Basic metabolic panel  Tomorrow morning,   R       Question:  Specimen collection method  Answer:  Lab=Lab collect   Signed and Held   Signed and Held  CBC  Tomorrow morning,   R       Question:  Specimen collection method  Answer:  Lab=Lab collect   Signed and Held   Signed and Held  CBC  (heparin)  Once,   R       Comments: Baseline for heparin therapy IF NOT ALREADY DRAWN.  Notify MD if PLT <  100 K.   Question:  Specimen collection method  Answer:  Lab=Lab collect   Signed and Held            Total time spent in review of labs and imaging, patient evaluation, formulation of plan, documentation and communication with family: 45 minutes  Signed, Lorin Glass, MD [facility] 02/02/2023

## 2023-02-02 NOTE — CV Procedure (Signed)
Shockwave lithotripsy and PCI to 99% mid LAD ISR. Overlapping stent 3.0X18 mm Onyx placed to prox-mid LAD. Femoral access, sheath sutured.   Full report to follow.  Elder Negus, MD

## 2023-02-02 NOTE — Plan of Care (Signed)

## 2023-02-02 NOTE — Plan of Care (Signed)

## 2023-02-02 NOTE — Progress Notes (Signed)
Pt reported 5/10 chest pain. EKG obtained. Dr. Rosemary Holms arrived at bedside and stated patient was stable.

## 2023-02-02 NOTE — Progress Notes (Signed)
*  PRELIMINARY RESULTS* Echocardiogram 2D Echocardiogram has been performed.  Laddie Aquas 02/02/2023, 10:32 AM

## 2023-02-02 NOTE — Progress Notes (Signed)
Cardiology Progress Note  Patient ID: Trevor [name] MRN: [id number] DOB: Oct 13, 1940 Date of Encounter: 02/02/2023  Primary Cardiologist: Marjo Bicker, MD  Subjective   Chief Complaint: Chest pain  HPI: Describes ongoing squeezing chest and abdominal discomfort.  Plan for left heart catheterization today.  ROS:  All other ROS reviewed and negative. Pertinent positives noted in the HPI.     Inpatient Medications  Scheduled Meds:  amLODipine  5 mg Oral Daily   aspirin EC  81 mg Oral Q breakfast   carvedilol  12.5 mg Oral BID WC   Chlorhexidine Gluconate Cloth  6 each Topical Q0600   hydrALAZINE  50 mg Oral BID   insulin aspart  0-6 Units Subcutaneous Q4H   pantoprazole (PROTONIX) IV  40 mg Intravenous Q24H   rosuvastatin  10 mg Oral QPM   ticagrelor  90 mg Oral BID   Continuous Infusions:  heparin 1,100 Units/hr (02/02/23 0810)   PRN Meds: acetaminophen **OR** acetaminophen, nitroGLYCERIN, ondansetron **OR** ondansetron (ZOFRAN) IV, oxyCODONE   Vital Signs   Vitals:   02/02/23 0109 02/02/23 0215 02/02/23 0700 02/02/23 0724  BP: 137/73 123/79  (!) 147/58  Pulse: 86 79  86  Resp: 16 (!) 21  17  Temp:  97.6 F (36.4 C)  97.6 F (36.4 C)  TempSrc:  Oral  Oral  SpO2: 96% 99% 99% 96%  Weight:      Height:        Intake/Output Summary (Last 24 hours) at 02/02/2023 0847 Last data filed at 02/02/2023 0801 Gross per 24 hour  Intake 308.67 ml  Output 2000 ml  Net -1691.33 ml      02/01/2023   11:01 PM 02/01/2023    2:39 AM 08/29/2022    1:50 AM  Last 3 Weights  Weight (lbs) 205 lb 14.6 oz 200 lb 194 lb 0.1 oz  Weight (kg) 93.4 kg 90.719 kg 88 kg      Telemetry  Overnight telemetry shows SR 80s, which I personally reviewed.   ECG  The most recent ECG shows SR 84, LVH, 1AVB, which I personally reviewed.   Physical Exam   Vitals:   02/02/23 0109 02/02/23 0215 02/02/23 0700 02/02/23 0724  BP: 137/73 123/79  (!) 147/58  Pulse: 86 79  86  Resp: 16  (!) 21  17  Temp:  97.6 F (36.4 C)  97.6 F (36.4 C)  TempSrc:  Oral  Oral  SpO2: 96% 99% 99% 96%  Weight:      Height:        Intake/Output Summary (Last 24 hours) at 02/02/2023 0847 Last data filed at 02/02/2023 0801 Gross per 24 hour  Intake 308.67 ml  Output 2000 ml  Net -1691.33 ml       02/01/2023   11:01 PM 02/01/2023    2:39 AM 08/29/2022    1:50 AM  Last 3 Weights  Weight (lbs) 205 lb 14.6 oz 200 lb 194 lb 0.1 oz  Weight (kg) 93.4 kg 90.719 kg 88 kg    Body mass index is 28.72 kg/m.  General: Well nourished, well developed, in no acute distress Head: Atraumatic, normal size  Eyes: PEERLA, EOMI  Neck: Supple, no JVD Endocrine: No thryomegaly Cardiac: Normal S1, S2; RRR; no murmurs, rubs, or gallops Lungs: Clear to auscultation bilaterally, no wheezing, rhonchi or rales  Abd: Soft, nontender, no hepatomegaly  Ext: No edema, pulses 2+ Musculoskeletal: No deformities, BUE and BLE strength normal and equal Skin: Warm and dry, no  rashes   Neuro: Alert and oriented to person, place, time, and situation, CNII-XII grossly intact, no focal deficits  Psych: Normal mood and affect   Labs  High Sensitivity Troponin:   Recent Labs  Lab 02/01/23 0241 02/01/23 0450  TROPONINIHS 1,079* 941*     Cardiac EnzymesNo results for input(s): "TROPONINI" in the last 168 hours. No results for input(s): "TROPIPOC" in the last 168 hours.  Chemistry Recent Labs  Lab 02/01/23 0241 02/01/23 2321  NA 132* 137  K 4.1 4.2  CL 97* 101  CO2 22 19*  GLUCOSE 194* 153*  BUN 53* 56*  CREATININE 8.45* 8.65*  CALCIUM 9.0 8.9  PROT 7.7 7.2  ALBUMIN 3.8 3.4*  AST 17 35  ALT 15 14  ALKPHOS 43 34*  BILITOT 0.5 0.9  GFRNONAA 6* 6*  ANIONGAP 13 17*    Hematology Recent Labs  Lab 02/01/23 0241 02/01/23 2321  WBC 12.8* 16.0*  RBC 4.01* 3.87*  HGB 12.3* 11.6*  HCT 37.2* 35.5*  MCV 92.8 91.7  MCH 30.7 30.0  MCHC 33.1 32.7  RDW 15.3 15.3  PLT 244 232   BNP Recent Labs  Lab  02/01/23 0241  BNP 1,259.0*    DDimer No results for input(s): "DDIMER" in the last 168 hours.   Radiology  DG Chest Portable 1 View  Result Date: 02/01/2023 CLINICAL DATA:  Chest pain and shortness of breath. EXAM: PORTABLE CHEST 1 VIEW COMPARISON:  PA chest 08/27/2022 FINDINGS: There is mild cardiomegaly.  Central vessels are mildly prominent. There is increased mild interstitial consolidation with a basal gradient consistent with interstitial edema/fluid overload or CHF. No focal pneumonic infiltrate is seen. There are minimal pleural effusions. The mediastinum is normally outlined. The aortic arch is heavily calcified. Thoracic spondylosis. No new osseous findings. Slight thoracic dextroscoliosis. IMPRESSION: 1. Cardiomegaly with increased mild interstitial consolidation with a basal gradient consistent with interstitial edema, findings consistent with fluid overload or CHF. 2. Minimal pleural effusions.  No focal consolidation. 3. Aortic atherosclerosis. Electronically Signed   By: Almira Bar M.D.   On: 02/01/2023 03:04    Cardiac Studies  LHC 08/28/2022 1.  RFR positive mid LAD lesion treated with orbital atherectomy, Cutting Balloon, and DES x 1 with OCT guidance. 2.  High-grade ostial RPLV lesion that should be treated medically. 3.  LVEDP of 17 mmHg.  TTE 08/27/2022  1. Left ventricular ejection fraction, by estimation, is >75%. The left  ventricle has hyperdynamic function. The left ventricle has no regional  wall motion abnormalities. There is moderate left ventricular hypertrophy.  Left ventricular diastolic  parameters were normal.   2. Right ventricular systolic function was not well visualized. The right  ventricular size is mildly enlarged. There is normal pulmonary artery  systolic pressure.   3. Left atrial size was mildly dilated.   4. The mitral valve is normal in structure. No evidence of mitral valve  regurgitation. No evidence of mitral stenosis.   5. The aortic  valve is tricuspid. Aortic valve regurgitation is not  visualized. No aortic stenosis is present.   6. The inferior vena cava is normal in size with greater than 50%  respiratory variability, suggesting right atrial pressure of 3 mmHg.   Patient Profile  Trevor [name] is a 82 y.o. male with CAD status post PCI to the mid LAD in May 2024, hypertension, hyperlipidemia, diabetes, ESRD on hemodialysis admitted with non-STEMI.  Assessment & Plan   # Non-STEMI -Admitted with chest discomfort and abdominal  discomfort described as squeezing for the past 2 to 3 days.  Troponins are elevated. -Continue aspirin and heparin drip.  The patient is on Brilinta due to recent PCI in May 2024. -Plan for left heart catheterization.  Risk and benefits explained.  He is willing to proceed. -Continue Crestor 10 mg daily. -Continue BP medications. -I have ordered a repeat echo as well. -coreg 12.5 mg BID -check lipids and tsh tomorrow   Informed Consent   Shared Decision Making/Informed Consent The risks [stroke (1 in 1000), death (1 in 1000), kidney failure [usually temporary] (1 in 500), bleeding (1 in 200), allergic reaction [possibly serious] (1 in 200)], benefits (diagnostic support and management of coronary artery disease) and alternatives of a cardiac catheterization were discussed in detail with Trevor [name] and he is willing to proceed.      # HTN -home meds  # ESRD on HD -renal  For questions or updates, please contact [facility] Please consult [email or web address] for contact info under        Signed, Gerri Spore T. Flora Lipps, MD, [facility] [place]  [facility]  02/02/2023 8:47 AM

## 2023-02-03 ENCOUNTER — Other Ambulatory Visit (HOSPITAL_COMMUNITY): Payer: Self-pay

## 2023-02-03 ENCOUNTER — Encounter (HOSPITAL_COMMUNITY): Payer: Self-pay | Admitting: Cardiology

## 2023-02-03 ENCOUNTER — Telehealth: Payer: Self-pay | Admitting: Cardiology

## 2023-02-03 DIAGNOSIS — I5021 Acute systolic (congestive) heart failure: Secondary | ICD-10-CM | POA: Diagnosis not present

## 2023-02-03 DIAGNOSIS — I214 Non-ST elevation (NSTEMI) myocardial infarction: Secondary | ICD-10-CM | POA: Diagnosis not present

## 2023-02-03 LAB — GLUCOSE, CAPILLARY
Glucose-Capillary: 158 mg/dL — ABNORMAL HIGH (ref 70–99)
Glucose-Capillary: 164 mg/dL — ABNORMAL HIGH (ref 70–99)
Glucose-Capillary: 174 mg/dL — ABNORMAL HIGH (ref 70–99)
Glucose-Capillary: 187 mg/dL — ABNORMAL HIGH (ref 70–99)

## 2023-02-03 LAB — CBC
HCT: 32.5 % — ABNORMAL LOW (ref 39.0–52.0)
HCT: 33.1 % — ABNORMAL LOW (ref 39.0–52.0)
Hemoglobin: 10.6 g/dL — ABNORMAL LOW (ref 13.0–17.0)
Hemoglobin: 11.3 g/dL — ABNORMAL LOW (ref 13.0–17.0)
MCH: 29.2 pg (ref 26.0–34.0)
MCH: 31 pg (ref 26.0–34.0)
MCHC: 32.6 g/dL (ref 30.0–36.0)
MCHC: 34.1 g/dL (ref 30.0–36.0)
MCV: 89.5 fL (ref 80.0–100.0)
MCV: 90.7 fL (ref 80.0–100.0)
Platelets: 217 10*3/uL (ref 150–400)
Platelets: 228 10*3/uL (ref 150–400)
RBC: 3.63 MIL/uL — ABNORMAL LOW (ref 4.22–5.81)
RBC: 3.65 MIL/uL — ABNORMAL LOW (ref 4.22–5.81)
RDW: 15.1 % (ref 11.5–15.5)
RDW: 15.1 % (ref 11.5–15.5)
WBC: 12.7 10*3/uL — ABNORMAL HIGH (ref 4.0–10.5)
WBC: 13.6 10*3/uL — ABNORMAL HIGH (ref 4.0–10.5)
nRBC: 0 % (ref 0.0–0.2)
nRBC: 0 % (ref 0.0–0.2)

## 2023-02-03 LAB — BASIC METABOLIC PANEL
Anion gap: 16 — ABNORMAL HIGH (ref 5–15)
BUN: 61 mg/dL — ABNORMAL HIGH (ref 8–23)
CO2: 22 mmol/L (ref 22–32)
Calcium: 8.5 mg/dL — ABNORMAL LOW (ref 8.9–10.3)
Chloride: 93 mmol/L — ABNORMAL LOW (ref 98–111)
Creatinine, Ser: 9.5 mg/dL — ABNORMAL HIGH (ref 0.61–1.24)
GFR, Estimated: 5 mL/min — ABNORMAL LOW (ref >60)
Glucose, Bld: 150 mg/dL — ABNORMAL HIGH (ref 70–99)
Potassium: 4.2 mmol/L (ref 3.5–5.1)
Sodium: 131 mmol/L — ABNORMAL LOW (ref 135–145)

## 2023-02-03 LAB — HEPATITIS B SURFACE ANTIBODY, QUANTITATIVE: Hep B S AB Quant (Post): 12.6 m[IU]/mL

## 2023-02-03 LAB — LIPID PANEL
Cholesterol: 100 mg/dL (ref 0–200)
HDL: 47 mg/dL (ref >40)
LDL Cholesterol: 42 mg/dL (ref 0–99)
Total CHOL/HDL Ratio: 2.1 {ratio}
Triglycerides: 55 mg/dL (ref <150)
VLDL: 11 mg/dL (ref 0–40)

## 2023-02-03 LAB — TSH: TSH: 1.898 u[IU]/mL (ref 0.350–4.500)

## 2023-02-03 MED ORDER — TICAGRELOR 90 MG PO TABS
90.0000 mg | ORAL_TABLET | Freq: Two times a day (BID) | ORAL | 11 refills | Status: DC
  Filled 2023-02-03: qty 60, 30d supply, fill #0

## 2023-02-03 MED ORDER — ISOSORBIDE MONONITRATE ER 30 MG PO TB24
30.0000 mg | ORAL_TABLET | Freq: Every day | ORAL | 2 refills | Status: DC
  Filled 2023-02-03: qty 30, 30d supply, fill #0

## 2023-02-03 MED ORDER — ISOSORBIDE MONONITRATE ER 30 MG PO TB24
30.0000 mg | ORAL_TABLET | Freq: Every day | ORAL | Status: DC
  Administered 2023-02-03: 30 mg via ORAL
  Filled 2023-02-03: qty 1

## 2023-02-03 MED ORDER — CALCITRIOL 0.25 MCG PO CAPS
1.2500 ug | ORAL_CAPSULE | ORAL | 0 refills | Status: DC
  Filled 2023-02-03: qty 60, 28d supply, fill #0
  Filled 2023-02-03: qty 12, 5d supply, fill #0

## 2023-02-03 NOTE — Telephone Encounter (Signed)
Currently admitted. Placed as TOC for 10/24

## 2023-02-03 NOTE — Plan of Care (Signed)

## 2023-02-03 NOTE — Progress Notes (Signed)
Subjective: Seen in room, no chest pain or shortness of breath status post cardiac cath yesterday with intervention, for HD today on schedule  Objective Vital signs in last 24 hours: Vitals:   02/03/23 0125 02/03/23 0225 02/03/23 0355 02/03/23 0712  BP: 127/60 (!) 140/45 127/60 (!) 142/61  Pulse: 68 73 74 77  Resp:   18 18  Temp:   97.8 F (36.6 C) 98.5 F (36.9 C)  TempSrc:   Oral Oral  SpO2: 95% 97% 97% 100%  Weight:      Height:       Weight change:   Physical Exam: General: Alert, finishing breakfast, NAD Heart: RRR, no MRG, Lungs: CTA, nonlabored breathing  Abdomen: NABS, soft NTND Extremities: No pedal edema Dialysis Access: + Bruit L UA AVF  Outpatient Dialysis Orders: [name]: [facility]  on MWF . EDW 89.5 HD Bath 2K/2.5Ca  Time 3:45 Heparin 500 units/hr. Access LU AVF BFR 400 DFR 500    Calcitriol 1.25 mcg po/HD  Micera 50 mcg every 2 weeks (last given 01/18/23)  Problem/Plan:  NSTEMI - recurrent.   Cardiology following, S/p stent to LAD in May. LHC 10/22="Shockwave lithotripsy and PCI to 99% mid LAD ISR. Overlapping stent 3.0X18 mm Onyx placed to prox-mid LAD."  This a.m. no chest pain no shortness of breath /'"feels better" ESRD -  HD on MWF sched  Hypertension/volume  - UF as tolerated with HD today  Anemia  -  stable, hgb 11.3  Metabolic bone disease -   continue with home meds  Nutrition -renal carb modified diet albumin 3.8-3.4  DM type 2 - per primary.   Trevor Pastel, PA-C [name] Bee[facility] [phone] 02/03/2023,8:19 AM  LOS: 2 days   Labs: Basic Metabolic Panel: Recent Labs  Lab 02/01/23 0241 02/01/23 0719 02/01/23 2321 02/02/23 2107  NA 132*  --  137  --   K 4.1  --  4.2  --   CL 97*  --  101  --   CO2 22  --  19*  --   GLUCOSE 194*  --  153*  --   BUN 53*  --  56*  --   CREATININE 8.45*  --  8.65* 7.85*  CALCIUM 9.0  --  8.9  --   PHOS  --  6.9*  --   --    Liver Function Tests: Recent Labs  Lab 02/01/23 0241  02/01/23 2321  AST 17 35  ALT 15 14  ALKPHOS 43 34*  BILITOT 0.5 0.9  PROT 7.7 7.2  ALBUMIN 3.8 3.4*   No results for input(s): "LIPASE", "AMYLASE" in the last 168 hours. No results for input(s): "AMMONIA" in the last 168 hours. CBC: Recent Labs  Lab 02/01/23 0241 02/01/23 2321 02/02/23 2335  WBC 12.8* 16.0* 12.7*  HGB 12.3* 11.6* 11.3*  HCT 37.2* 35.5* 33.1*  MCV 92.8 91.7 90.7  PLT 244 232 217   Cardiac Enzymes: No results for input(s): "CKTOTAL", "CKMB", "CKMBINDEX", "TROPONINI" in the last 168 hours. CBG: Recent Labs  Lab 02/02/23 1804 02/02/23 2042 02/03/23 0025 02/03/23 0352 02/03/23 0710  GLUCAP 153* 192* 187* 164* 158*    Studies/Results: CARDIAC CATHETERIZATION  Addendum Date: 02/02/2023   Coronary angiography 02/02/2023: LM: Mid 30% stenosis (Unchanged form 08/2022) LAD: Ostial-prox 30% disease (Unchanged form 08/2022)          Mid LAD 99% ISR with severe calcification (New since 08/2022)          Mid LAD myocardial bridge (  distal to the stents), accentuated by IC nitroglycerin Lcx: Prox 50% disease with moderate calcification (Unchanged form 08/2022) RCA: Prox 30% disease (Unchanged form 08/2022) LVEDP normal Successful percutaneous coronary intervention prox-mid LAD     Intravascular ultrasound (IVUS)     Shockwave lithotripsy 3.0X12 mm balloon X12 cycles     PTCA and stent placement 3.0X18 mm Onyx DES to in mid-prox LAD overlapping with prior mid LAD stent Pre-PCI IVUS MSA 3.1 mm1 Post PCI IVUS MSA 5.4 mm2 in the distal part of the old stent which was further postdilated.  Proximal part of the new stent and overlap of two stents had MSA of 7.9 mm. Elder Negus, MD   Result Date: 02/02/2023 Images from the original result were not included. Coronary angiography 02/02/2023: LM: Mid 30% stenosis (Unchanged form 08/2022) LAD: Ostial-prox 30% disease (Unchanged form 08/2022)          Mid LAD 99% ISR with severe calcification (New since 08/2022) Lcx: Prox 50% disease  with moderate calcification (Unchanged form 08/2022) RCA: Prox 30% disease (Unchanged form 08/2022) LVEDP normal Successful percutaneous coronary intervention prox-mid LAD     Intravascular ultrasound (IVUS)     Shockwave lithotripsy 3.0X12 mm balloon X12 cycles     PTCA and stent placement 3.0X18 mm Onyx DES to in mid-prox LAD overlapping with prior mid LAD stent Pre-PCI IVUS MSA 3.1 mm1 Post PCI IVUS MSA 5.4 mm2 in the distal part of the old stent which was further postdilated.  Proximal part of the new stent and overlap of two stents had MSA of 7.9 mm. Elder Negus, MD   ECHOCARDIOGRAM COMPLETE  Result Date: 02/02/2023    ECHOCARDIOGRAM REPORT   Patient Name:   Trevor [name] Date of Exam: 02/02/2023 Medical Rec #:  [id number]        Height:       71.0 in Accession #:    [id number]       Weight:       205.9 lb Date of Birth:  1941-03-24        BSA:          2.135 m Patient Age:    82 years         BP:           145/70 mmHg Patient Gender: M                HR:           79 bpm. Exam Location:  Inpatient Procedure: 2D Echo, Cardiac Doppler, Color Doppler and Intracardiac            Opacification Agent Indications:    CHF I50.21  History:        Patient has prior history of Echocardiogram examinations, most                 recent 08/27/2022. CHF, CAD; Risk Factors:Hypertension, Diabetes                 and Dyslipidemia.  Sonographer:    Dondra Prader RVT RCS Referring Phys: [id number] OLADAPO ADEFESO IMPRESSIONS  1. Left ventricular ejection fraction, by estimation, is 45 to 50%. The left ventricle has mildly decreased function. The left ventricle demonstrates regional wall motion abnormalities (see scoring diagram/findings for description). There is mild left ventricular hypertrophy of the basal-septal segment. Left ventricular diastolic parameters are indeterminate. The average left ventricular global longitudinal strain is -9.3 %. The global longitudinal strain is abnormal.  2. Right ventricular  systolic  function is normal. The right ventricular size is normal.  3. The mitral valve is normal in structure. No evidence of mitral valve regurgitation. No evidence of mitral stenosis.  4. The aortic valve is tricuspid. Aortic valve regurgitation is not visualized. No aortic stenosis is present.  5. The inferior vena cava is normal in size with greater than 50% respiratory variability, suggesting right atrial pressure of 3 mmHg. FINDINGS  Left Ventricle: Left ventricular ejection fraction, by estimation, is 45 to 50%. The left ventricle has mildly decreased function. The left ventricle demonstrates regional wall motion abnormalities. The average left ventricular global longitudinal strain is -9.3 %. The global longitudinal strain is abnormal. The left ventricular internal cavity size was normal in size. There is mild left ventricular hypertrophy of the basal-septal segment. Left ventricular diastolic parameters are indeterminate.  LV Wall Scoring: The mid and distal anterior septum, apical anterior segment, apical inferior segment, and apex are akinetic. Right Ventricle: The right ventricular size is normal. No increase in right ventricular wall thickness. Right ventricular systolic function is normal. Left Atrium: Left atrial size was normal in size. Right Atrium: Right atrial size was normal in size. Pericardium: There is no evidence of pericardial effusion. Mitral Valve: The mitral valve is normal in structure. No evidence of mitral valve regurgitation. No evidence of mitral valve stenosis. Tricuspid Valve: The tricuspid valve is normal in structure. Tricuspid valve regurgitation is not demonstrated. No evidence of tricuspid stenosis. Aortic Valve: The aortic valve is tricuspid. Aortic valve regurgitation is not visualized. No aortic stenosis is present. Aortic valve mean gradient measures 3.0 mmHg. Aortic valve peak gradient measures 4.7 mmHg. Aortic valve area, by VTI measures 3.37 cm. Pulmonic Valve: The pulmonic  valve was normal in structure. Pulmonic valve regurgitation is trivial. No evidence of pulmonic stenosis. Aorta: The aortic root is normal in size and structure. Venous: The inferior vena cava is normal in size with greater than 50% respiratory variability, suggesting right atrial pressure of 3 mmHg. IAS/Shunts: No atrial level shunt detected by color flow Doppler.  LEFT VENTRICLE PLAX 2D LVIDd:         4.20 cm   Diastology LVIDs:         2.80 cm   LV e' medial:    15.90 cm/s LV PW:         0.90 cm   LV E/e' medial:  9.6 LV IVS:        1.20 cm   LV e' lateral:   18.30 cm/s LVOT diam:     2.10 cm   LV E/e' lateral: 8.3 LV SV:         72 LV SV Index:   34        2D Longitudinal Strain LVOT Area:     3.46 cm  2D Strain GLS Avg:     -9.3 %  RIGHT VENTRICLE            IVC RV S prime:     8.43 cm/s  IVC diam: 2.00 cm TAPSE (M-mode): 2.5 cm LEFT ATRIUM             Index        RIGHT ATRIUM           Index LA diam:        4.00 cm 1.87 cm/m   RA Area:     13.80 cm LA Vol (A2C):   62.7 ml 29.37 ml/m  RA Volume:   33.00 ml  15.46 ml/m LA Vol (A4C):   64.6 ml 30.26 ml/m LA Biplane Vol: 66.1 ml 30.96 ml/m  AORTIC VALVE                    PULMONIC VALVE AV Area (Vmax):    3.12 cm     PV Vmax:       0.98 m/s AV Area (Vmean):   3.15 cm     PV Peak grad:  3.8 mmHg AV Area (VTI):     3.37 cm AV Vmax:           108.00 cm/s AV Vmean:          74.700 cm/s AV VTI:            0.213 m AV Peak Grad:      4.7 mmHg AV Mean Grad:      3.0 mmHg LVOT Vmax:         97.30 cm/s LVOT Vmean:        67.900 cm/s LVOT VTI:          0.207 m LVOT/AV VTI ratio: 0.97  AORTA Ao Asc diam: 3.30 cm MITRAL VALVE MV Area (PHT): 3.99 cm     SHUNTS MV Decel Time: 190 msec     Systemic VTI:  0.21 m MV E velocity: 152.00 cm/s  Systemic Diam: 2.10 cm Donato Schultz MD Electronically signed by Donato Schultz MD Signature Date/Time: 02/02/2023/12:07:58 PM    Final    Medications:   amLODipine  5 mg Oral Daily   aspirin EC  81 mg Oral Q breakfast   calcitRIOL   1.25 mcg Oral Q M,W,F-HD   carvedilol  12.5 mg Oral BID WC   heparin  5,000 Units Subcutaneous Q8H   hydrALAZINE  50 mg Oral BID   insulin aspart  0-6 Units Subcutaneous Q4H   pantoprazole (PROTONIX) IV  40 mg Intravenous Q24H   rosuvastatin  10 mg Oral QPM   ticagrelor  90 mg Oral BID

## 2023-02-03 NOTE — TOC Transition Note (Signed)
Transition of Care [facility]) - CM/SW Discharge Note   Patient Details  Name: Trevor [name] MRN: [id number] Date of Birth: 1940-06-26  Transition of Care [facility]) CM/SW Contact:  Lawerance Sabal, RN Phone Number: 02/03/2023, 10:56 AM   Clinical Narrative:     Patient from home, independent, will DC today once meds filled through [facility] pharmacy.  PCP Nita Sells Coverage [employer]MO      Barriers to Discharge: Continued Medical Work up   Patient Goals and CMS Choice      Discharge Placement                         Discharge Plan and Services Additional resources added to the After Visit Summary for                                       Social Determinants of Health (SDOH) Interventions SDOH Screenings   Food Insecurity: No Food Insecurity (02/02/2023)  Housing: Patient Unable To Answer (02/02/2023)  Transportation Needs: No Transportation Needs (08/28/2022)  Utilities: Not At Risk (02/02/2023)  Tobacco Use: Low Risk  (02/01/2023)     Readmission Risk Interventions    02/03/2023   10:55 AM 02/01/2023   12:44 PM  Readmission Risk Prevention Plan  Transportation Screening Complete Complete  PCP or Specialist Appt within 3-5 Days Not Complete   HRI or Home Care Consult Complete Complete  Social Work Consult for Recovery Care Planning/Counseling Complete Complete  Palliative Care Screening Not Applicable Not Applicable  Medication Review Oceanographer) Complete Complete

## 2023-02-03 NOTE — Discharge Summary (Signed)
[facility]  Physician Discharge Summary   Patient ID: ELJAY BETHARD MRN: [id number] DOB/AGE: [birth year] 82 y.o.  Admit date: 02/01/2023 Discharge date:   02/03/2023   PCP: Benita Stabile, MD  DISCHARGE DIAGNOSES:    NSTEMI (non-ST elevated myocardial infarction) (HCC)   Acute systolic congestive heart failure (CHF) (HCC)   Atypical chest pain   End-stage renal disease on hemodialysis (HCC)   Essential hypertension   Mixed hyperlipidemia   CAD S/P percutaneous coronary angioplasty    RECOMMENDATIONS FOR OUTPATIENT FOLLOW UP: Cardiology to schedule outpatient appointment. Patient will continue with his usual hemodialysis regimen.    Home Health: None Equipment/Devices: None  CODE STATUS: Full code  DISCHARGE CONDITION: fair  Diet recommendation: Heart healthy  INITIAL HISTORY: Trevor [name] is a 82 y.o. male with PMH significant for ESRD-HD-MWF, DM2, HTN, HLD, CAD/stent 5/24, chronic anemia 10/21, patient presented to ED at [facility] with 2 to 3 days of abdominal pain.   Workup in the ED showed Troponin elevated to >1000, BNP elevated to over 1200 Chest x-ray with cardiomegaly, mild increased interstitial edema EKG with ST depression and T wave inversion   Heparin drip was initiated Patient was transferred to [facility] to be seen by cardiology Nephrology consultation was obtained as well.  HOSPITAL COURSE:   Non-ST elevation MI Presented with chest and abdominal discomfort.  Troponins noted to be elevated.  Was started on heparin infusion.  Seen by cardiology.  Underwent cardiac catheterization with PCI and stent to LAD.  Will be discharged on aspirin and Brilinta.  Already on statin.  Lipid panel and TSH is pending.  Hopefully this can be drawn at hemodialysis today.  CAD/ stents May 2024 HLD Acute systolic congestive heart failure ESRD-HD-MWF Hypertension   Type 2 diabetes mellitus A1c 5.3 on 02/01/2023  Patient is stable.  Okay for  discharge home after hemodialysis today.  PERTINENT LABS:  The results of significant diagnostics from this hospitalization (including imaging, microbiology, ancillary and laboratory) are listed below for reference.    Labs:   Basic Metabolic Panel: Recent Labs  Lab 02/01/23 0241 02/01/23 0719 02/01/23 2321 02/02/23 2107  NA 132*  --  137  --   K 4.1  --  4.2  --   CL 97*  --  101  --   CO2 22  --  19*  --   GLUCOSE 194*  --  153*  --   BUN 53*  --  56*  --   CREATININE 8.45*  --  8.65* 7.85*  CALCIUM 9.0  --  8.9  --   MG  --  1.9 1.9  --   PHOS  --  6.9*  --   --    Liver Function Tests: Recent Labs  Lab 02/01/23 0241 02/01/23 2321  AST 17 35  ALT 15 14  ALKPHOS 43 34*  BILITOT 0.5 0.9  PROT 7.7 7.2  ALBUMIN 3.8 3.4*    CBC: Recent Labs  Lab 02/01/23 0241 02/01/23 2321 02/02/23 2335  WBC 12.8* 16.0* 12.7*  HGB 12.3* 11.6* 11.3*  HCT 37.2* 35.5* 33.1*  MCV 92.8 91.7 90.7  PLT 244 232 217    BNP: BNP (last 3 results) Recent Labs    08/27/22 1106 02/01/23 0241  BNP 867.0* 1,259.0*    CBG: Recent Labs  Lab 02/02/23 2042 02/03/23 0025 02/03/23 0352 02/03/23 0710 02/03/23 1127  GLUCAP 192* 187* 164* 158* 174*     IMAGING STUDIES CARDIAC CATHETERIZATION  Addendum Date: 02/02/2023   Coronary angiography 02/02/2023: LM: Mid 30% stenosis (Unchanged form 08/2022) LAD: Ostial-prox 30% disease (Unchanged form 08/2022)          Mid LAD 99% ISR with severe calcification (New since 08/2022)          Mid LAD myocardial bridge (distal to the stents), accentuated by IC nitroglycerin Lcx: Prox 50% disease with moderate calcification (Unchanged form 08/2022) RCA: Prox 30% disease (Unchanged form 08/2022) LVEDP normal Successful percutaneous coronary intervention prox-mid LAD     Intravascular ultrasound (IVUS)     Shockwave lithotripsy 3.0X12 mm balloon X12 cycles     PTCA and stent placement 3.0X18 mm Onyx DES to in mid-prox LAD overlapping with prior mid LAD  stent Pre-PCI IVUS MSA 3.1 mm1 Post PCI IVUS MSA 5.4 mm2 in the distal part of the old stent which was further postdilated.  Proximal part of the new stent and overlap of two stents had MSA of 7.9 mm. Elder Negus, MD   Result Date: 02/02/2023 Images from the original result were not included. Coronary angiography 02/02/2023: LM: Mid 30% stenosis (Unchanged form 08/2022) LAD: Ostial-prox 30% disease (Unchanged form 08/2022)          Mid LAD 99% ISR with severe calcification (New since 08/2022) Lcx: Prox 50% disease with moderate calcification (Unchanged form 08/2022) RCA: Prox 30% disease (Unchanged form 08/2022) LVEDP normal Successful percutaneous coronary intervention prox-mid LAD     Intravascular ultrasound (IVUS)     Shockwave lithotripsy 3.0X12 mm balloon X12 cycles     PTCA and stent placement 3.0X18 mm Onyx DES to in mid-prox LAD overlapping with prior mid LAD stent Pre-PCI IVUS MSA 3.1 mm1 Post PCI IVUS MSA 5.4 mm2 in the distal part of the old stent which was further postdilated.  Proximal part of the new stent and overlap of two stents had MSA of 7.9 mm. Elder Negus, MD   ECHOCARDIOGRAM COMPLETE  Result Date: 02/02/2023    ECHOCARDIOGRAM REPORT   Patient Name:   Trevor [name] [facility] Date of Exam: 02/02/2023 Medical Rec #:  [id number]        Height:       71.0 in Accession #:    [id number]       Weight:       205.9 lb Date of Birth:  18-Oct-1940        BSA:          2.135 m Patient Age:    82 years         BP:           145/70 mmHg Patient Gender: M                HR:           79 bpm. Exam Location:  Inpatient Procedure: 2D Echo, Cardiac Doppler, Color Doppler and Intracardiac            Opacification Agent Indications:    CHF I50.21  History:        Patient has prior history of Echocardiogram examinations, most                 recent 08/27/2022. CHF, CAD; Risk Factors:Hypertension, Diabetes                 and Dyslipidemia.  Sonographer:    Dondra Prader RVT RCS Referring Phys: [id number]  OLADAPO ADEFESO IMPRESSIONS  1. Left ventricular ejection fraction, by estimation, is 45 to 50%. The  [facility]  Physician Discharge Summary   Patient ID: ELJAY BETHARD MRN: [id number] DOB/AGE: [birth year] 82 y.o.  Admit date: 02/01/2023 Discharge date:   02/03/2023   PCP: Benita Stabile, MD  DISCHARGE DIAGNOSES:    NSTEMI (non-ST elevated myocardial infarction) (HCC)   Acute systolic congestive heart failure (CHF) (HCC)   Atypical chest pain   End-stage renal disease on hemodialysis (HCC)   Essential hypertension   Mixed hyperlipidemia   CAD S/P percutaneous coronary angioplasty    RECOMMENDATIONS FOR OUTPATIENT FOLLOW UP: Cardiology to schedule outpatient appointment. Patient will continue with his usual hemodialysis regimen.    Home Health: None Equipment/Devices: None  CODE STATUS: Full code  DISCHARGE CONDITION: fair  Diet recommendation: Heart healthy  INITIAL HISTORY: Trevor [name] is a 82 y.o. male with PMH significant for ESRD-HD-MWF, DM2, HTN, HLD, CAD/stent 5/24, chronic anemia 10/21, patient presented to ED at [facility] with 2 to 3 days of abdominal pain.   Workup in the ED showed Troponin elevated to >1000, BNP elevated to over 1200 Chest x-ray with cardiomegaly, mild increased interstitial edema EKG with ST depression and T wave inversion   Heparin drip was initiated Patient was transferred to [facility] to be seen by cardiology Nephrology consultation was obtained as well.  HOSPITAL COURSE:   Non-ST elevation MI Presented with chest and abdominal discomfort.  Troponins noted to be elevated.  Was started on heparin infusion.  Seen by cardiology.  Underwent cardiac catheterization with PCI and stent to LAD.  Will be discharged on aspirin and Brilinta.  Already on statin.  Lipid panel and TSH is pending.  Hopefully this can be drawn at hemodialysis today.  CAD/ stents May 2024 HLD Acute systolic congestive heart failure ESRD-HD-MWF Hypertension   Type 2 diabetes mellitus A1c 5.3 on 02/01/2023  Patient is stable.  Okay for  discharge home after hemodialysis today.  PERTINENT LABS:  The results of significant diagnostics from this hospitalization (including imaging, microbiology, ancillary and laboratory) are listed below for reference.    Labs:   Basic Metabolic Panel: Recent Labs  Lab 02/01/23 0241 02/01/23 0719 02/01/23 2321 02/02/23 2107  NA 132*  --  137  --   K 4.1  --  4.2  --   CL 97*  --  101  --   CO2 22  --  19*  --   GLUCOSE 194*  --  153*  --   BUN 53*  --  56*  --   CREATININE 8.45*  --  8.65* 7.85*  CALCIUM 9.0  --  8.9  --   MG  --  1.9 1.9  --   PHOS  --  6.9*  --   --    Liver Function Tests: Recent Labs  Lab 02/01/23 0241 02/01/23 2321  AST 17 35  ALT 15 14  ALKPHOS 43 34*  BILITOT 0.5 0.9  PROT 7.7 7.2  ALBUMIN 3.8 3.4*    CBC: Recent Labs  Lab 02/01/23 0241 02/01/23 2321 02/02/23 2335  WBC 12.8* 16.0* 12.7*  HGB 12.3* 11.6* 11.3*  HCT 37.2* 35.5* 33.1*  MCV 92.8 91.7 90.7  PLT 244 232 217    BNP: BNP (last 3 results) Recent Labs    08/27/22 1106 02/01/23 0241  BNP 867.0* 1,259.0*    CBG: Recent Labs  Lab 02/02/23 2042 02/03/23 0025 02/03/23 0352 02/03/23 0710 02/03/23 1127  GLUCAP 192* 187* 164* 158* 174*     IMAGING STUDIES CARDIAC CATHETERIZATION  Addendum Date: 02/02/2023   Coronary angiography 02/02/2023: LM: Mid 30% stenosis (Unchanged form 08/2022) LAD: Ostial-prox 30% disease (Unchanged form 08/2022)          Mid LAD 99% ISR with severe calcification (New since 08/2022)          Mid LAD myocardial bridge (distal to the stents), accentuated by IC nitroglycerin Lcx: Prox 50% disease with moderate calcification (Unchanged form 08/2022) RCA: Prox 30% disease (Unchanged form 08/2022) LVEDP normal Successful percutaneous coronary intervention prox-mid LAD     Intravascular ultrasound (IVUS)     Shockwave lithotripsy 3.0X12 mm balloon X12 cycles     PTCA and stent placement 3.0X18 mm Onyx DES to in mid-prox LAD overlapping with prior mid LAD  stent Pre-PCI IVUS MSA 3.1 mm1 Post PCI IVUS MSA 5.4 mm2 in the distal part of the old stent which was further postdilated.  Proximal part of the new stent and overlap of two stents had MSA of 7.9 mm. Elder Negus, MD   Result Date: 02/02/2023 Images from the original result were not included. Coronary angiography 02/02/2023: LM: Mid 30% stenosis (Unchanged form 08/2022) LAD: Ostial-prox 30% disease (Unchanged form 08/2022)          Mid LAD 99% ISR with severe calcification (New since 08/2022) Lcx: Prox 50% disease with moderate calcification (Unchanged form 08/2022) RCA: Prox 30% disease (Unchanged form 08/2022) LVEDP normal Successful percutaneous coronary intervention prox-mid LAD     Intravascular ultrasound (IVUS)     Shockwave lithotripsy 3.0X12 mm balloon X12 cycles     PTCA and stent placement 3.0X18 mm Onyx DES to in mid-prox LAD overlapping with prior mid LAD stent Pre-PCI IVUS MSA 3.1 mm1 Post PCI IVUS MSA 5.4 mm2 in the distal part of the old stent which was further postdilated.  Proximal part of the new stent and overlap of two stents had MSA of 7.9 mm. Elder Negus, MD   ECHOCARDIOGRAM COMPLETE  Result Date: 02/02/2023    ECHOCARDIOGRAM REPORT   Patient Name:   Trevor [name] [facility] Date of Exam: 02/02/2023 Medical Rec #:  [id number]        Height:       71.0 in Accession #:    [id number]       Weight:       205.9 lb Date of Birth:  18-Oct-1940        BSA:          2.135 m Patient Age:    82 years         BP:           145/70 mmHg Patient Gender: M                HR:           79 bpm. Exam Location:  Inpatient Procedure: 2D Echo, Cardiac Doppler, Color Doppler and Intracardiac            Opacification Agent Indications:    CHF I50.21  History:        Patient has prior history of Echocardiogram examinations, most                 recent 08/27/2022. CHF, CAD; Risk Factors:Hypertension, Diabetes                 and Dyslipidemia.  Sonographer:    Dondra Prader RVT RCS Referring Phys: [id number]  OLADAPO ADEFESO IMPRESSIONS  1. Left ventricular ejection fraction, by estimation, is 45 to 50%. [facility]  [facility]  Physician Discharge Summary   Patient ID: ELJAY BETHARD MRN: [id number] DOB/AGE: [birth year] 82 y.o.  Admit date: 02/01/2023 Discharge date:   02/03/2023   PCP: Benita Stabile, MD  DISCHARGE DIAGNOSES:    NSTEMI (non-ST elevated myocardial infarction) (HCC)   Acute systolic congestive heart failure (CHF) (HCC)   Atypical chest pain   End-stage renal disease on hemodialysis (HCC)   Essential hypertension   Mixed hyperlipidemia   CAD S/P percutaneous coronary angioplasty    RECOMMENDATIONS FOR OUTPATIENT FOLLOW UP: Cardiology to schedule outpatient appointment. Patient will continue with his usual hemodialysis regimen.    Home Health: None Equipment/Devices: None  CODE STATUS: Full code  DISCHARGE CONDITION: fair  Diet recommendation: Heart healthy  INITIAL HISTORY: Trevor [name] is a 82 y.o. male with PMH significant for ESRD-HD-MWF, DM2, HTN, HLD, CAD/stent 5/24, chronic anemia 10/21, patient presented to ED at [facility] with 2 to 3 days of abdominal pain.   Workup in the ED showed Troponin elevated to >1000, BNP elevated to over 1200 Chest x-ray with cardiomegaly, mild increased interstitial edema EKG with ST depression and T wave inversion   Heparin drip was initiated Patient was transferred to [facility] to be seen by cardiology Nephrology consultation was obtained as well.  HOSPITAL COURSE:   Non-ST elevation MI Presented with chest and abdominal discomfort.  Troponins noted to be elevated.  Was started on heparin infusion.  Seen by cardiology.  Underwent cardiac catheterization with PCI and stent to LAD.  Will be discharged on aspirin and Brilinta.  Already on statin.  Lipid panel and TSH is pending.  Hopefully this can be drawn at hemodialysis today.  CAD/ stents May 2024 HLD Acute systolic congestive heart failure ESRD-HD-MWF Hypertension   Type 2 diabetes mellitus A1c 5.3 on 02/01/2023  Patient is stable.  Okay for  discharge home after hemodialysis today.  PERTINENT LABS:  The results of significant diagnostics from this hospitalization (including imaging, microbiology, ancillary and laboratory) are listed below for reference.    Labs:   Basic Metabolic Panel: Recent Labs  Lab 02/01/23 0241 02/01/23 0719 02/01/23 2321 02/02/23 2107  NA 132*  --  137  --   K 4.1  --  4.2  --   CL 97*  --  101  --   CO2 22  --  19*  --   GLUCOSE 194*  --  153*  --   BUN 53*  --  56*  --   CREATININE 8.45*  --  8.65* 7.85*  CALCIUM 9.0  --  8.9  --   MG  --  1.9 1.9  --   PHOS  --  6.9*  --   --    Liver Function Tests: Recent Labs  Lab 02/01/23 0241 02/01/23 2321  AST 17 35  ALT 15 14  ALKPHOS 43 34*  BILITOT 0.5 0.9  PROT 7.7 7.2  ALBUMIN 3.8 3.4*    CBC: Recent Labs  Lab 02/01/23 0241 02/01/23 2321 02/02/23 2335  WBC 12.8* 16.0* 12.7*  HGB 12.3* 11.6* 11.3*  HCT 37.2* 35.5* 33.1*  MCV 92.8 91.7 90.7  PLT 244 232 217    BNP: BNP (last 3 results) Recent Labs    08/27/22 1106 02/01/23 0241  BNP 867.0* 1,259.0*    CBG: Recent Labs  Lab 02/02/23 2042 02/03/23 0025 02/03/23 0352 02/03/23 0710 02/03/23 1127  GLUCAP 192* 187* 164* 158* 174*     IMAGING STUDIES CARDIAC CATHETERIZATION  Addendum Date: 02/02/2023   Coronary angiography 02/02/2023: LM: Mid 30% stenosis (Unchanged form 08/2022) LAD: Ostial-prox 30% disease (Unchanged form 08/2022)          Mid LAD 99% ISR with severe calcification (New since 08/2022)          Mid LAD myocardial bridge (distal to the stents), accentuated by IC nitroglycerin Lcx: Prox 50% disease with moderate calcification (Unchanged form 08/2022) RCA: Prox 30% disease (Unchanged form 08/2022) LVEDP normal Successful percutaneous coronary intervention prox-mid LAD     Intravascular ultrasound (IVUS)     Shockwave lithotripsy 3.0X12 mm balloon X12 cycles     PTCA and stent placement 3.0X18 mm Onyx DES to in mid-prox LAD overlapping with prior mid LAD  stent Pre-PCI IVUS MSA 3.1 mm1 Post PCI IVUS MSA 5.4 mm2 in the distal part of the old stent which was further postdilated.  Proximal part of the new stent and overlap of two stents had MSA of 7.9 mm. Elder Negus, MD   Result Date: 02/02/2023 Images from the original result were not included. Coronary angiography 02/02/2023: LM: Mid 30% stenosis (Unchanged form 08/2022) LAD: Ostial-prox 30% disease (Unchanged form 08/2022)          Mid LAD 99% ISR with severe calcification (New since 08/2022) Lcx: Prox 50% disease with moderate calcification (Unchanged form 08/2022) RCA: Prox 30% disease (Unchanged form 08/2022) LVEDP normal Successful percutaneous coronary intervention prox-mid LAD     Intravascular ultrasound (IVUS)     Shockwave lithotripsy 3.0X12 mm balloon X12 cycles     PTCA and stent placement 3.0X18 mm Onyx DES to in mid-prox LAD overlapping with prior mid LAD stent Pre-PCI IVUS MSA 3.1 mm1 Post PCI IVUS MSA 5.4 mm2 in the distal part of the old stent which was further postdilated.  Proximal part of the new stent and overlap of two stents had MSA of 7.9 mm. Elder Negus, MD   ECHOCARDIOGRAM COMPLETE  Result Date: 02/02/2023    ECHOCARDIOGRAM REPORT   Patient Name:   Trevor [name] [facility] Date of Exam: 02/02/2023 Medical Rec #:  [id number]        Height:       71.0 in Accession #:    [id number]       Weight:       205.9 lb Date of Birth:  18-Oct-1940        BSA:          2.135 m Patient Age:    82 years         BP:           145/70 mmHg Patient Gender: M                HR:           79 bpm. Exam Location:  Inpatient Procedure: 2D Echo, Cardiac Doppler, Color Doppler and Intracardiac            Opacification Agent Indications:    CHF I50.21  History:        Patient has prior history of Echocardiogram examinations, most                 recent 08/27/2022. CHF, CAD; Risk Factors:Hypertension, Diabetes                 and Dyslipidemia.  Sonographer:    Dondra Prader RVT RCS Referring Phys: [id number]  OLADAPO ADEFESO IMPRESSIONS  1. Left ventricular ejection fraction, by estimation, is 45 to 50%. The

## 2023-02-03 NOTE — Progress Notes (Signed)
Cardiology Progress Note  Patient ID: CHEVEZ RIVEST MRN: [id number] DOB: 1940/06/20 Date of Encounter: 02/03/2023  Primary Cardiologist: Marjo Bicker, MD  Subjective   Chief Complaint: none.   HPI: PCI to mid LAD for ISR.  Apparently was only taking Brilinta once per day.  Understands the importance of taking this twice per day.  ROS:  All other ROS reviewed and negative. Pertinent positives noted in the HPI.     Inpatient Medications  Scheduled Meds:  amLODipine  5 mg Oral Daily   aspirin EC  81 mg Oral Q breakfast   calcitRIOL  1.25 mcg Oral Q M,W,F-HD   carvedilol  12.5 mg Oral BID WC   heparin  5,000 Units Subcutaneous Q8H   hydrALAZINE  50 mg Oral BID   insulin aspart  0-6 Units Subcutaneous Q4H   pantoprazole (PROTONIX) IV  40 mg Intravenous Q24H   rosuvastatin  10 mg Oral QPM   ticagrelor  90 mg Oral BID   Continuous Infusions:  PRN Meds: acetaminophen **OR** acetaminophen, acetaminophen, nitroGLYCERIN, ondansetron **OR** ondansetron (ZOFRAN) IV, oxyCODONE   Vital Signs   Vitals:   02/03/23 0125 02/03/23 0225 02/03/23 0355 02/03/23 0712  BP: 127/60 (!) 140/45 127/60 (!) 142/61  Pulse: 68 73 74 77  Resp:   18 18  Temp:   97.8 F (36.6 C) 98.5 F (36.9 C)  TempSrc:   Oral Oral  SpO2: 95% 97% 97% 100%  Weight:      Height:        Intake/Output Summary (Last 24 hours) at 02/03/2023 0931 Last data filed at 02/02/2023 2100 Gross per 24 hour  Intake 240 ml  Output --  Net 240 ml      02/01/2023   11:01 PM 02/01/2023    2:39 AM 08/29/2022    1:50 AM  Last 3 Weights  Weight (lbs) 205 lb 14.6 oz 200 lb 194 lb 0.1 oz  Weight (kg) 93.4 kg 90.719 kg 88 kg      Telemetry  Overnight telemetry shows sinus rhythm 70s, which I personally reviewed.   ECG  The most recent ECG shows sinus rhythm 70, first-degree AV block, LVH with repolarization, which I personally reviewed.   Physical Exam   Vitals:   02/03/23 0125 02/03/23 0225 02/03/23 0355  02/03/23 0712  BP: 127/60 (!) 140/45 127/60 (!) 142/61  Pulse: 68 73 74 77  Resp:   18 18  Temp:   97.8 F (36.6 C) 98.5 F (36.9 C)  TempSrc:   Oral Oral  SpO2: 95% 97% 97% 100%  Weight:      Height:        Intake/Output Summary (Last 24 hours) at 02/03/2023 0931 Last data filed at 02/02/2023 2100 Gross per 24 hour  Intake 240 ml  Output --  Net 240 ml       02/01/2023   11:01 PM 02/01/2023    2:39 AM 08/29/2022    1:50 AM  Last 3 Weights  Weight (lbs) 205 lb 14.6 oz 200 lb 194 lb 0.1 oz  Weight (kg) 93.4 kg 90.719 kg 88 kg    Body mass index is 28.72 kg/m.  General: Well nourished, well developed, in no acute distress Head: Atraumatic, normal size  Eyes: PEERLA, EOMI  Neck: Supple, no JVD Endocrine: No thryomegaly Cardiac: Normal S1, S2; RRR; no murmurs, rubs, or gallops Lungs: Clear to auscultation bilaterally, no wheezing, rhonchi or rales  Abd: Soft, nontender, no hepatomegaly  Ext: No edema, pulses  in the distal part of the old stent which was further postdilated.  Proximal part of the new stent and overlap of two stents had MSA of 7.9 mm. Elder Negus, MD   ECHOCARDIOGRAM COMPLETE  Result Date: 02/02/2023    ECHOCARDIOGRAM REPORT   Patient Name:   Trevor [name] [facility] Date of Exam: 02/02/2023 Medical Rec #:  [id number]        Height:       71.0 in Accession #:    [id number]       Weight:       205.9 lb Date of Birth:  [birth year]        BSA:          2.135 m Patient Age:    82 years         BP:           145/70 mmHg Patient Gender: M                HR:           79 bpm. Exam Location:  Inpatient Procedure: 2D Echo, Cardiac Doppler, Color Doppler and Intracardiac            Opacification Agent Indications:    CHF I50.21  History:        Patient has prior history of Echocardiogram examinations, most                 recent 08/27/2022. CHF, CAD; Risk Factors:Hypertension, Diabetes                 and Dyslipidemia.  Sonographer:    Dondra Prader RVT RCS Referring Phys: [id number] OLADAPO ADEFESO IMPRESSIONS  1. Left ventricular ejection fraction, by estimation, is 45 to 50%. The left ventricle has mildly decreased function. The left ventricle demonstrates regional wall motion abnormalities (see scoring diagram/findings for description). There is mild left ventricular hypertrophy of the basal-septal segment. Left ventricular diastolic parameters are indeterminate. The average left ventricular global longitudinal strain is -9.3 %. The global longitudinal strain is abnormal.  2. Right ventricular systolic function is normal. The right ventricular size is normal.  3. The mitral valve is normal in structure. No evidence of mitral valve regurgitation. No evidence of mitral stenosis.  4. The aortic valve is tricuspid. Aortic valve regurgitation is not visualized. No aortic stenosis is present.  5. The inferior vena cava  is normal in size with greater than 50% respiratory variability, suggesting right atrial pressure of 3 mmHg. FINDINGS  Left Ventricle: Left ventricular ejection fraction, by estimation, is 45 to 50%. The left ventricle has mildly decreased function. The left ventricle demonstrates regional wall motion abnormalities. The average left ventricular global longitudinal strain is -9.3 %. The global longitudinal strain is abnormal. The left ventricular internal cavity size was normal in size. There is mild left ventricular hypertrophy of the basal-septal segment. Left ventricular diastolic parameters are indeterminate.  LV Wall Scoring: The mid and distal anterior septum, apical anterior segment, apical inferior segment, and apex are akinetic. Right Ventricle: The right ventricular size is normal. No increase in right ventricular wall thickness. Right ventricular systolic function is normal. Left Atrium: Left atrial size was normal in size. Right Atrium: Right atrial size was normal in size. Pericardium: There is no evidence of pericardial effusion. Mitral Valve: The mitral valve is normal in structure. No evidence of mitral valve regurgitation. No evidence of mitral valve stenosis. Tricuspid Valve: The tricuspid valve is normal in structure. Tricuspid valve regurgitation  in the distal part of the old stent which was further postdilated.  Proximal part of the new stent and overlap of two stents had MSA of 7.9 mm. Elder Negus, MD   ECHOCARDIOGRAM COMPLETE  Result Date: 02/02/2023    ECHOCARDIOGRAM REPORT   Patient Name:   Trevor [name] [facility] Date of Exam: 02/02/2023 Medical Rec #:  [id number]        Height:       71.0 in Accession #:    [id number]       Weight:       205.9 lb Date of Birth:  [birth year]        BSA:          2.135 m Patient Age:    82 years         BP:           145/70 mmHg Patient Gender: M                HR:           79 bpm. Exam Location:  Inpatient Procedure: 2D Echo, Cardiac Doppler, Color Doppler and Intracardiac            Opacification Agent Indications:    CHF I50.21  History:        Patient has prior history of Echocardiogram examinations, most                 recent 08/27/2022. CHF, CAD; Risk Factors:Hypertension, Diabetes                 and Dyslipidemia.  Sonographer:    Dondra Prader RVT RCS Referring Phys: [id number] OLADAPO ADEFESO IMPRESSIONS  1. Left ventricular ejection fraction, by estimation, is 45 to 50%. The left ventricle has mildly decreased function. The left ventricle demonstrates regional wall motion abnormalities (see scoring diagram/findings for description). There is mild left ventricular hypertrophy of the basal-septal segment. Left ventricular diastolic parameters are indeterminate. The average left ventricular global longitudinal strain is -9.3 %. The global longitudinal strain is abnormal.  2. Right ventricular systolic function is normal. The right ventricular size is normal.  3. The mitral valve is normal in structure. No evidence of mitral valve regurgitation. No evidence of mitral stenosis.  4. The aortic valve is tricuspid. Aortic valve regurgitation is not visualized. No aortic stenosis is present.  5. The inferior vena cava  is normal in size with greater than 50% respiratory variability, suggesting right atrial pressure of 3 mmHg. FINDINGS  Left Ventricle: Left ventricular ejection fraction, by estimation, is 45 to 50%. The left ventricle has mildly decreased function. The left ventricle demonstrates regional wall motion abnormalities. The average left ventricular global longitudinal strain is -9.3 %. The global longitudinal strain is abnormal. The left ventricular internal cavity size was normal in size. There is mild left ventricular hypertrophy of the basal-septal segment. Left ventricular diastolic parameters are indeterminate.  LV Wall Scoring: The mid and distal anterior septum, apical anterior segment, apical inferior segment, and apex are akinetic. Right Ventricle: The right ventricular size is normal. No increase in right ventricular wall thickness. Right ventricular systolic function is normal. Left Atrium: Left atrial size was normal in size. Right Atrium: Right atrial size was normal in size. Pericardium: There is no evidence of pericardial effusion. Mitral Valve: The mitral valve is normal in structure. No evidence of mitral valve regurgitation. No evidence of mitral valve stenosis. Tricuspid Valve: The tricuspid valve is normal in structure. Tricuspid valve regurgitation  Cardiology Progress Note  Patient ID: CHEVEZ RIVEST MRN: [id number] DOB: 1940/06/20 Date of Encounter: 02/03/2023  Primary Cardiologist: Marjo Bicker, MD  Subjective   Chief Complaint: none.   HPI: PCI to mid LAD for ISR.  Apparently was only taking Brilinta once per day.  Understands the importance of taking this twice per day.  ROS:  All other ROS reviewed and negative. Pertinent positives noted in the HPI.     Inpatient Medications  Scheduled Meds:  amLODipine  5 mg Oral Daily   aspirin EC  81 mg Oral Q breakfast   calcitRIOL  1.25 mcg Oral Q M,W,F-HD   carvedilol  12.5 mg Oral BID WC   heparin  5,000 Units Subcutaneous Q8H   hydrALAZINE  50 mg Oral BID   insulin aspart  0-6 Units Subcutaneous Q4H   pantoprazole (PROTONIX) IV  40 mg Intravenous Q24H   rosuvastatin  10 mg Oral QPM   ticagrelor  90 mg Oral BID   Continuous Infusions:  PRN Meds: acetaminophen **OR** acetaminophen, acetaminophen, nitroGLYCERIN, ondansetron **OR** ondansetron (ZOFRAN) IV, oxyCODONE   Vital Signs   Vitals:   02/03/23 0125 02/03/23 0225 02/03/23 0355 02/03/23 0712  BP: 127/60 (!) 140/45 127/60 (!) 142/61  Pulse: 68 73 74 77  Resp:   18 18  Temp:   97.8 F (36.6 C) 98.5 F (36.9 C)  TempSrc:   Oral Oral  SpO2: 95% 97% 97% 100%  Weight:      Height:        Intake/Output Summary (Last 24 hours) at 02/03/2023 0931 Last data filed at 02/02/2023 2100 Gross per 24 hour  Intake 240 ml  Output --  Net 240 ml      02/01/2023   11:01 PM 02/01/2023    2:39 AM 08/29/2022    1:50 AM  Last 3 Weights  Weight (lbs) 205 lb 14.6 oz 200 lb 194 lb 0.1 oz  Weight (kg) 93.4 kg 90.719 kg 88 kg      Telemetry  Overnight telemetry shows sinus rhythm 70s, which I personally reviewed.   ECG  The most recent ECG shows sinus rhythm 70, first-degree AV block, LVH with repolarization, which I personally reviewed.   Physical Exam   Vitals:   02/03/23 0125 02/03/23 0225 02/03/23 0355  02/03/23 0712  BP: 127/60 (!) 140/45 127/60 (!) 142/61  Pulse: 68 73 74 77  Resp:   18 18  Temp:   97.8 F (36.6 C) 98.5 F (36.9 C)  TempSrc:   Oral Oral  SpO2: 95% 97% 97% 100%  Weight:      Height:        Intake/Output Summary (Last 24 hours) at 02/03/2023 0931 Last data filed at 02/02/2023 2100 Gross per 24 hour  Intake 240 ml  Output --  Net 240 ml       02/01/2023   11:01 PM 02/01/2023    2:39 AM 08/29/2022    1:50 AM  Last 3 Weights  Weight (lbs) 205 lb 14.6 oz 200 lb 194 lb 0.1 oz  Weight (kg) 93.4 kg 90.719 kg 88 kg    Body mass index is 28.72 kg/m.  General: Well nourished, well developed, in no acute distress Head: Atraumatic, normal size  Eyes: PEERLA, EOMI  Neck: Supple, no JVD Endocrine: No thryomegaly Cardiac: Normal S1, S2; RRR; no murmurs, rubs, or gallops Lungs: Clear to auscultation bilaterally, no wheezing, rhonchi or rales  Abd: Soft, nontender, no hepatomegaly  Ext: No edema, pulses  in the distal part of the old stent which was further postdilated.  Proximal part of the new stent and overlap of two stents had MSA of 7.9 mm. Elder Negus, MD   ECHOCARDIOGRAM COMPLETE  Result Date: 02/02/2023    ECHOCARDIOGRAM REPORT   Patient Name:   Trevor [name] [facility] Date of Exam: 02/02/2023 Medical Rec #:  [id number]        Height:       71.0 in Accession #:    [id number]       Weight:       205.9 lb Date of Birth:  [birth year]        BSA:          2.135 m Patient Age:    82 years         BP:           145/70 mmHg Patient Gender: M                HR:           79 bpm. Exam Location:  Inpatient Procedure: 2D Echo, Cardiac Doppler, Color Doppler and Intracardiac            Opacification Agent Indications:    CHF I50.21  History:        Patient has prior history of Echocardiogram examinations, most                 recent 08/27/2022. CHF, CAD; Risk Factors:Hypertension, Diabetes                 and Dyslipidemia.  Sonographer:    Dondra Prader RVT RCS Referring Phys: [id number] OLADAPO ADEFESO IMPRESSIONS  1. Left ventricular ejection fraction, by estimation, is 45 to 50%. The left ventricle has mildly decreased function. The left ventricle demonstrates regional wall motion abnormalities (see scoring diagram/findings for description). There is mild left ventricular hypertrophy of the basal-septal segment. Left ventricular diastolic parameters are indeterminate. The average left ventricular global longitudinal strain is -9.3 %. The global longitudinal strain is abnormal.  2. Right ventricular systolic function is normal. The right ventricular size is normal.  3. The mitral valve is normal in structure. No evidence of mitral valve regurgitation. No evidence of mitral stenosis.  4. The aortic valve is tricuspid. Aortic valve regurgitation is not visualized. No aortic stenosis is present.  5. The inferior vena cava  is normal in size with greater than 50% respiratory variability, suggesting right atrial pressure of 3 mmHg. FINDINGS  Left Ventricle: Left ventricular ejection fraction, by estimation, is 45 to 50%. The left ventricle has mildly decreased function. The left ventricle demonstrates regional wall motion abnormalities. The average left ventricular global longitudinal strain is -9.3 %. The global longitudinal strain is abnormal. The left ventricular internal cavity size was normal in size. There is mild left ventricular hypertrophy of the basal-septal segment. Left ventricular diastolic parameters are indeterminate.  LV Wall Scoring: The mid and distal anterior septum, apical anterior segment, apical inferior segment, and apex are akinetic. Right Ventricle: The right ventricular size is normal. No increase in right ventricular wall thickness. Right ventricular systolic function is normal. Left Atrium: Left atrial size was normal in size. Right Atrium: Right atrial size was normal in size. Pericardium: There is no evidence of pericardial effusion. Mitral Valve: The mitral valve is normal in structure. No evidence of mitral valve regurgitation. No evidence of mitral valve stenosis. Tricuspid Valve: The tricuspid valve is normal in structure. Tricuspid valve regurgitation

## 2023-02-03 NOTE — Progress Notes (Signed)
Patient off floor to dialysis

## 2023-02-03 NOTE — Procedures (Signed)
I was present at this dialysis session. I have reviewed the session itself and made appropriate changes.   Filed Weights   02/01/23 0239 02/01/23 2301  Weight: 90.7 kg 93.4 kg    Recent Labs  Lab 02/01/23 0719 02/01/23 2321 02/02/23 2107  NA  --  137  --   K  --  4.2  --   CL  --  101  --   CO2  --  19*  --   GLUCOSE  --  153*  --   BUN  --  56*  --   CREATININE  --  8.65* 7.85*  CALCIUM  --  8.9  --   PHOS 6.9*  --   --     Recent Labs  Lab 02/01/23 0241 02/01/23 2321 02/02/23 2335  WBC 12.8* 16.0* 12.7*  HGB 12.3* 11.6* 11.3*  HCT 37.2* 35.5* 33.1*  MCV 92.8 91.7 90.7  PLT 244 232 217    Scheduled Meds:  aspirin EC  81 mg Oral Q breakfast   calcitRIOL  1.25 mcg Oral Q M,W,F-HD   carvedilol  12.5 mg Oral BID WC   heparin  5,000 Units Subcutaneous Q8H   hydrALAZINE  50 mg Oral BID   insulin aspart  0-6 Units Subcutaneous Q4H   isosorbide mononitrate  30 mg Oral Daily   pantoprazole (PROTONIX) IV  40 mg Intravenous Q24H   rosuvastatin  10 mg Oral QPM   ticagrelor  90 mg Oral BID   Continuous Infusions: PRN Meds:.acetaminophen **OR** acetaminophen, acetaminophen, nitroGLYCERIN, ondansetron **OR** ondansetron (ZOFRAN) IV, oxyCODONE   Anthony Sar, MD [facility] 02/03/2023, 2:31 PM

## 2023-02-03 NOTE — Progress Notes (Addendum)
D/C order noted. Pt also has orders for HD prior to d/c. Discussed case with attending, nephrologist, and renal PA. Pt needs necessary lab work prior to d/c and lab has been unable to obtain. Plan is for labs to be drawn with HD. Pt will require inpt HD for this reason. Contacted [facility]  to alert staff of pt's d/c today and that pt should resume on Friday. Will fax d/c summary and last renal note to clinic once available for continuation of care.   Olivia Canter Renal Navigator [phone]  Addendum at 1:06 pm: D/C summary and last renal note faxed to clinic for continuation of care.

## 2023-02-03 NOTE — Telephone Encounter (Signed)
Transition of Care Follow-up Phone Call Request    Patient Name: Trevor [name] Date of Birth: March 19, 1941 Date of Encounter: 02/03/2023  Primary Care Provider:  Benita Stabile, MD Primary Cardiologist:  Marjo Bicker, MD  Trevor [name] has been scheduled for a transition of care follow up appointment with a [facility] provider:  Bernadene Person 02/09/23 @10 :05am.   Please reach out to Trevor [name] within 48 hours of discharge to confirm appointment and review transition of care protocol questionnaire. Anticipated discharge date: 10/24  Perlie Gold, PA-C  02/03/2023, 9:20 AM

## 2023-02-03 NOTE — Progress Notes (Signed)
   02/03/23 1730  Vitals  Temp 98.3 F (36.8 C)  Pulse Rate 88  Resp 19  BP (!) 141/63  SpO2 97 %  O2 Device Room Air  Weight 88.4 kg  Type of Weight Post-Dialysis  Post Treatment  Dialyzer Clearance Lightly streaked  Hemodialysis Intake (mL) 0 mL  Liters Processed 72  Fluid Removed (mL) 3000 mL  Tolerated HD Treatment Yes  AVG/AVF Arterial Site Held (minutes) 8 minutes  AVG/AVF Venous Site Held (minutes) 8 minutes   Received patient in bed to unit.  Alert and oriented.  Informed consent signed and in chart.   TX duration:3hrs  Patient tolerated well.  Transported back to the room  Alert, without acute distress.  Hand-off given to patient's nurse.   Access used: LAVF Access issues: none  Total UF removed: 3L Medication(s) given: calcitriol 1.15mcg   Na'Shaminy T [facility]

## 2023-02-03 NOTE — Progress Notes (Addendum)
CARDIAC REHAB PHASE I   PRE:  Rate/Rhythm: 74 SR   BP:  Sitting: 142/61      SaO2: 97 RA   MODE:  Ambulation: 150 ft   POST:  Rate/Rhythm: 83 SR   BP:  Sitting: 135/55      SaO2: 98  RA   Pt ambulated independently in hallway. Tolerated well with no CP, dizziness and mild SOB at times. Pt reports off and on SOB, discussed Brilinta side effects. Pt states SOB was better after coffee this morning. Returned to bed with call bell and bedside table in reach. Post MI/stent education including restrictions, risk factors, exercise guidelines, antiplatelet therapy importance, MI booklet, NTG use, heart healthy diabetic diet and CRP2 reviewed. All questions and concerns addressed. Will refer to [facility] for CRP2. Will continue to follow.   0900-0930 Woodroe Chen, RN BSN 02/03/2023 9:20 AM

## 2023-02-03 NOTE — Progress Notes (Signed)
Patient refusing to wear tele monitor.  

## 2023-02-04 ENCOUNTER — Telehealth: Payer: Self-pay

## 2023-02-04 ENCOUNTER — Telehealth: Payer: Self-pay | Admitting: *Deleted

## 2023-02-04 DIAGNOSIS — I214 Non-ST elevation (NSTEMI) myocardial infarction: Secondary | ICD-10-CM

## 2023-02-04 NOTE — Progress Notes (Signed)
Care Coordination  Outreach Note  02/04/2023 Name: Trevor [name] MRN: [id number] DOB: 04-Sep-1940   Care Coordination Outreach Attempts: An unsuccessful telephone outreach was attempted today to offer the patient information about available care coordination services.  Follow Up Plan:  Additional outreach attempts will be made to offer the patient care coordination information and services.   Encounter Outcome:  No Answer  Gwenevere Ghazi  Care Coordination Care Guide  Direct Dial: [phone]

## 2023-02-04 NOTE — Telephone Encounter (Signed)
Patient contacted regarding discharge from [facility] on 02/03/23.  Patient understands to follow up with provider [name] on at 10:05 am  at [facility]. Patient understands discharge instructions? yes  Patient understands medications and regiment? yes  Patient understands to bring all medications to this visit? yes     Patient spoke to patient he stated he needed a way to get to dialysis tomorrow. Patient states he can drive himself,but the summary indicated for him not to drive now.  RN asked if he a family member  or friend to assist. Patient states she asked someone earlier but they have not gotten back in touch with him.   He states he needs to be at the center at 5:30 am. RN asked [facility] social worker  for assistance  Social worker stated to have patient to contact dialysis Child psychotherapist for assistance. RN relyed message to patient . Patient stats okay and hung up.

## 2023-02-05 DIAGNOSIS — Z992 Dependence on renal dialysis: Secondary | ICD-10-CM | POA: Diagnosis not present

## 2023-02-05 DIAGNOSIS — N186 End stage renal disease: Secondary | ICD-10-CM | POA: Diagnosis not present

## 2023-02-05 DIAGNOSIS — N2581 Secondary hyperparathyroidism of renal origin: Secondary | ICD-10-CM | POA: Diagnosis not present

## 2023-02-05 NOTE — Progress Notes (Signed)
Care Coordination   Note   02/05/2023 Name: Trevor [name] MRN: [id number] DOB: Jul 19, 1940  Trevor [name] is a 82 y.o. year old male who sees [name], [name], MD for primary care. I reached out to [name] by phone today to offer care coordination services.  [name] was given information about Care Coordination services today including:   The Care Coordination services include support from the care team which includes your Nurse Coordinator, Clinical Social Worker, or Pharmacist.  The Care Coordination team is here to help remove barriers to the health concerns and goals most important to you. Care Coordination services are voluntary, and the patient may decline or stop services at any time by request to their care team member.   Care Coordination Consent Status: Patient agreed to services and verbal consent obtained.   Follow up plan:  Telephone appointment with care coordination team member scheduled for:  02/11/23  Encounter Outcome:  Patient Scheduled  [facility] Coordination Care Guide  Direct Dial: [phone]

## 2023-02-08 DIAGNOSIS — N2581 Secondary hyperparathyroidism of renal origin: Secondary | ICD-10-CM | POA: Diagnosis not present

## 2023-02-08 DIAGNOSIS — N186 End stage renal disease: Secondary | ICD-10-CM | POA: Diagnosis not present

## 2023-02-08 DIAGNOSIS — Z992 Dependence on renal dialysis: Secondary | ICD-10-CM | POA: Diagnosis not present

## 2023-02-09 ENCOUNTER — Ambulatory Visit: Payer: Medicare HMO | Attending: Nurse Practitioner | Admitting: Nurse Practitioner

## 2023-02-09 ENCOUNTER — Encounter: Payer: Self-pay | Admitting: Nurse Practitioner

## 2023-02-09 VITALS — BP 122/54 | HR 66 | Ht 71.0 in | Wt 200.0 lb

## 2023-02-09 DIAGNOSIS — I25110 Atherosclerotic heart disease of native coronary artery with unstable angina pectoris: Secondary | ICD-10-CM | POA: Diagnosis not present

## 2023-02-09 DIAGNOSIS — I700 Atherosclerosis of aorta: Secondary | ICD-10-CM | POA: Insufficient documentation

## 2023-02-09 DIAGNOSIS — I2510 Atherosclerotic heart disease of native coronary artery without angina pectoris: Secondary | ICD-10-CM

## 2023-02-09 DIAGNOSIS — I10 Essential (primary) hypertension: Secondary | ICD-10-CM

## 2023-02-09 DIAGNOSIS — N186 End stage renal disease: Secondary | ICD-10-CM

## 2023-02-09 DIAGNOSIS — I214 Non-ST elevation (NSTEMI) myocardial infarction: Secondary | ICD-10-CM | POA: Diagnosis not present

## 2023-02-09 DIAGNOSIS — I440 Atrioventricular block, first degree: Secondary | ICD-10-CM | POA: Diagnosis not present

## 2023-02-09 DIAGNOSIS — E785 Hyperlipidemia, unspecified: Secondary | ICD-10-CM | POA: Diagnosis not present

## 2023-02-09 DIAGNOSIS — I132 Hypertensive heart and chronic kidney disease with heart failure and with stage 5 chronic kidney disease, or end stage renal disease: Secondary | ICD-10-CM | POA: Diagnosis not present

## 2023-02-09 DIAGNOSIS — I5022 Chronic systolic (congestive) heart failure: Secondary | ICD-10-CM

## 2023-02-09 DIAGNOSIS — R300 Dysuria: Secondary | ICD-10-CM | POA: Diagnosis not present

## 2023-02-09 DIAGNOSIS — E119 Type 2 diabetes mellitus without complications: Secondary | ICD-10-CM | POA: Diagnosis not present

## 2023-02-09 DIAGNOSIS — Z992 Dependence on renal dialysis: Secondary | ICD-10-CM | POA: Diagnosis not present

## 2023-02-09 DIAGNOSIS — I255 Ischemic cardiomyopathy: Secondary | ICD-10-CM

## 2023-02-09 DIAGNOSIS — I5032 Chronic diastolic (congestive) heart failure: Secondary | ICD-10-CM | POA: Diagnosis not present

## 2023-02-09 DIAGNOSIS — D649 Anemia, unspecified: Secondary | ICD-10-CM | POA: Diagnosis not present

## 2023-02-09 DIAGNOSIS — I129 Hypertensive chronic kidney disease with stage 1 through stage 4 chronic kidney disease, or unspecified chronic kidney disease: Secondary | ICD-10-CM | POA: Diagnosis not present

## 2023-02-09 NOTE — Patient Instructions (Signed)
Medication Instructions:  Your physician recommends that you continue on your current medications as directed. Please refer to the Current Medication list given to you today.  *If you need a refill on your cardiac medications before your next appointment, please call your pharmacy*   Lab Work: NONE ordered at this time of appointment     Testing/Procedures: Your physician has requested that you have an echocardiogram. Echocardiography is a painless test that uses sound waves to create images of your heart. It provides your doctor with information about the size and shape of your heart and how well your heart's chambers and valves are working. This procedure takes approximately one hour. There are no restrictions for this procedure. Please do NOT wear cologne, perfume, aftershave, or lotions (deodorant is allowed). Please arrive 15 minutes prior to your appointment time.    Follow-Up: At [facility], you and your health needs are our priority.  As part of our continuing mission to provide you with exceptional heart care, we have created designated Provider Care Teams.  These Care Teams include your primary Cardiologist (physician) and Advanced Practice Providers (APPs -  Physician Assistants and Nurse Practitioners) who all work together to provide you with the care you need, when you need it.  We recommend signing up for the patient portal called "MyChart".  Sign up information is provided on this After Visit Summary.  MyChart is used to connect with patients for Virtual Visits (Telemedicine).  Patients are able to view lab/test results, encounter notes, upcoming appointments, etc.  Non-urgent messages can be sent to your provider as well.   To learn more about what you can do with MyChart, go to [email or web address].    Your next appointment:    3 months post echo  Provider:   You may see Vishnu P Mallipeddi, MD or any App in [place] or [address]

## 2023-02-09 NOTE — Progress Notes (Signed)
Office Visit    Patient Name: Trevor [name] Date of Encounter: 02/09/2023  Primary Care Provider:  Benita Stabile, MD Primary Cardiologist:  Marjo Bicker, MD  Chief Complaint    82 year old male with a history of prior PCI-mLAD in 08/31/2022, s/p NSTEMI, DES-mLAD (ISR) in 01/2023, chronic systolic heart failure (EF 45 to 50%), ICM, first-degree AV block, hypertension, hyperlipidemia, type 2 diabetes, and ESRD on HD who presents for hospital follow-up related to CAD s/p DES.   Past Medical History    Past Medical History:  Diagnosis Date   Anemia    Chronic kidney disease    Stage 4?    Diabetes mellitus without complication (HCC)    Hepatitis    not sure what type - over 20 years ago   History of kidney stones    Hyperlipidemia    Hypertension    Past Surgical History:  Procedure Laterality Date   AV FISTULA PLACEMENT Left 08/09/2019   Procedure: LEFT ARM Basilic  ARTERIOVENOUS  FISTULA CREATION;  Surgeon: Maeola Harman, MD;  Location: [facility] OR;  Service: Vascular;  Laterality: Left;   AV FISTULA PLACEMENT Left 12/17/2020   Procedure: INSERTION OF ARTERIOVENOUS (AV) GORE-TEX GRAFT LEFT ARM;  Surgeon: Maeola Harman, MD;  Location: [facility] OR;  Service: Vascular;  Laterality: Left;   BASCILIC VEIN TRANSPOSITION Left 11/30/2019   Procedure: LEFT SECOND STAGE BASCILIC VEIN TRANSPOSITION;  Surgeon: Maeola Harman, MD;  Location: [facility] OR;  Service: Vascular;  Laterality: Left;   COLONOSCOPY N/A 07/07/2017   Procedure: COLONOSCOPY;  Surgeon: West Bali, MD;  [facility];  Service: Endoscopy;  Laterality: N/A;  8:30   CORONARY ATHERECTOMY N/A 08/28/2022   Procedure: CORONARY ATHERECTOMY;  Surgeon: Orbie Pyo, MD;  Location: [facility] INVASIVE CV LAB;  Service: Cardiovascular;  Laterality: N/A;   CORONARY IMAGING/OCT N/A 08/28/2022   Procedure: CORONARY IMAGING/OCT;  Surgeon: Orbie Pyo, MD;  Location: [facility] INVAS[facility] CV LAB;  Service:  Cardiovascular;  Laterality: N/A;   CORONARY LITHOTRIPSY N/A 02/02/2023   Procedure: CORONARY LITHOTRIPSY;  Surgeon: Elder Negus, MD;  Location: [facility] LAB;  Service: Cardiovascular;  Laterality: N/A;   CORONARY PRESSURE/FFR STUDY N/A 08/28/2022   Procedure: CORONARY PRESSURE/FFR STUDY;  Surgeon: Orbie Pyo, MD;  Location: [facility];  Service: Cardiovascular;  Laterality: N/A;   CORONARY STENT INTERVENTION N/A 08/28/2022   Procedure: CORONARY STENT INTERVENTION;  Surgeon: Orbie Pyo, MD;  Location: [facility];  Service: Cardiovascular;  Laterality: N/A;   CORONARY STENT INTERVENTION N/A 02/02/2023   Procedure: CORONARY STENT INTERVENTION;  Surgeon: Elder Negus, MD;  Location: [facility];  Service: Cardiovascular;  Laterality: N/A;   CORONARY ULTRASOUND/IVUS N/A 02/02/2023   Procedure: Coronary Ultrasound/IVUS;  Surgeon: Elder Negus, MD;  Location: [facility];  Service: Cardiovascular;  Laterality: N/A;   IR FLUORO GUIDE CV LINE RIGHT  09/06/2020   IR US GUIDE VASC ACCESS RIGHT  09/06/2020   LEFT HEART CATH AND CORONARY ANGIOGRAPHY N/A 08/28/2022   Procedure: LEFT HEART CATH AND CORONARY ANGIOGRAPHY;  Surgeon: Orbie Pyo, MD;  [facility];  Service: Cardiovascular;  Laterality: N/A;   LEFT HEART CATH AND CORONARY ANGIOGRAPHY N/A 02/02/2023   Procedure: LEFT HEART CATH AND CORONARY ANGIOGRAPHY;  Surgeon: Elder Negus, MD;  Location: [facility];  Service: Cardiovascular;  Laterality: N/A;   TOTAL HIP ARTHROPLASTY Right 10/09/2020  Procedure: TOTAL HIP ARTHROPLASTY ANTERIOR APPROACH;  Surgeon: [name], MD;  Location: [facility] OR;  Service: Orthopedics;  Laterality: Right;   TOTAL HIP ARTHROPLASTY Left 07/23/2022   Procedure: TOTAL HIP ARTHROPLASTY ANTERIOR APPROACH;  Surgeon: [name], MD;  Location: [facility] OR;  Service: Orthopedics;  Laterality: Left;    Allergies  No Known  Allergies   Labs/Other Studies Reviewed    The following studies were reviewed today:  Cardiac Studies & Procedures   CARDIAC CATHETERIZATION  CARDIAC CATHETERIZATION 02/02/2023  Narrative Images from the original result were not included. Coronary angiography 02/02/2023: LM: Mid 30% stenosis (Unchanged form 08/2022) LAD: Ostial-prox 30% disease (Unchanged form 08/2022) Mid LAD 99% ISR with severe calcification (New since 08/2022) Mid LAD myocardial bridge (distal to the stents), accentuated by IC nitroglycerin Lcx: Prox 50% disease with moderate calcification (Unchanged form 08/2022) RCA: Prox 30% disease (Unchanged form 08/2022)  LVEDP normal  Successful percutaneous coronary intervention prox-mid LAD Intravascular ultrasound (IVUS) Shockwave lithotripsy 3.0X12 mm balloon X12 cycles PTCA and stent placement 3.0X18 mm Onyx DES to in mid-prox LAD overlapping with prior mid LAD stent  Pre-PCI IVUS MSA 3.1 mm1 Post PCI IVUS MSA 5.4 mm2 in the distal part of the old stent which was further postdilated.  Proximal part of the new stent and overlap of two stents had MSA of 7.9 mm.    Manish Emiliano Dyer, MD  Findings Coronary Findings Diagnostic  Dominance: Right  Left Main Mid LM lesion is 30% stenosed.  Left Anterior Descending Mid LAD myocardial bridge, accentuated by IC nitroglycerin Ost LAD to Prox LAD lesion is 30% stenosed. Prox LAD to Mid LAD lesion is 99% stenosed. The lesion is calcified. The lesion was previously treated using a drug eluting stent between 1-5 months ago. Ultrasound (IVUS) was performed. Minimum stent area: 3.18 mm. Mid LAD lesion is 30% stenosed. The lesion was previously treated using a drug eluting stent between 1-5 months ago.  Left Circumflex There is mild diffuse disease throughout the vessel. Ost Cx to Prox Cx lesion is 50% stenosed. The lesion is moderately calcified.  Right Coronary Artery Prox RCA lesion is 30% stenosed.  Right  Posterior Atrioventricular Artery RPAV lesion is 80% stenosed.  Intervention  Prox LAD to Mid LAD lesion Stent CATH LAUNCHER 6FR EBU3.5 guide catheter was inserted. Lesion crossed with guidewire using a WIRE ASAHI PROWATER 180CM. Pre-stent angioplasty was performed using a BALLN [place] EUPHORA RX 3.0X15. A drug-eluting stent was successfully placed using a STENT ONYX FRONTIER 3.0X18. Maximum pressure: 14 atm. Inflation time: 30 sec. Stent strut is well apposed. Stent overlaps previously placed stent. Post-stent angioplasty was performed using a BALLN [place] EMERGE MR [id number]. Maximum pressure:  20 atm. Inflation time:  2 sec. Shockwave lithotripsy prior to stenting Post-Intervention Lesion Assessment The intervention was successful. Pre-interventional TIMI flow is 1. Post-intervention TIMI flow is 3. No complications occurred at this lesion. Ultrasound (IVUS) was performed on the lesion post PCI. Minimum stent area: 7.9 mm. There is a 0% residual stenosis post intervention.  Mid LAD lesion Angioplasty Balloon angioplasty was performed using a BALLN [place] EMERGE MR 3.25X12. Maximum pressure: 16 atm. Inflation time: 15 sec. Post-Intervention Lesion Assessment The intervention was successful. Pre-interventional TIMI flow is 1. Post-intervention TIMI flow is 3. No complications occurred at this lesion. There is a 0% residual stenosis post intervention.   CARDIAC CATHETERIZATION  CARDIAC CATHETERIZATION 08/28/2022  Narrative   Mid LAD lesion is 95% stenosed.   Ost LAD to Prox LAD lesion  is 30% stenosed.   Prox RCA lesion is 30% stenosed.   RPAV lesion is 80% stenosed.   A stent was successfully placed.   Post intervention, there is a 0% residual stenosis.  1.  RFR positive mid LAD lesion treated with orbital atherectomy, Cutting Balloon, and DES x 1 with OCT guidance. 2.  High-grade ostial RPLV lesion that should be treated medically. 3.  LVEDP of 17 mmHg.  Recommendation: Dual antiplatelet  therapy for at least 1 year; aggressive cardiovascular risk factor modification.  Findings Coronary Findings Diagnostic  Dominance: Right  Left Anterior Descending Ost LAD to Prox LAD lesion is 30% stenosed. Mid LAD lesion is 95% stenosed.  Left Circumflex There is mild diffuse disease throughout the vessel.  Right Coronary Artery Prox RCA lesion is 30% stenosed.  Right Posterior Atrioventricular Artery RPAV lesion is 80% stenosed.  Intervention  Mid LAD lesion Stent A stent was successfully placed. Post-Intervention Lesion Assessment The intervention was successful. Pre-interventional TIMI flow is 3. Post-intervention TIMI flow is 3. There is a 0% residual stenosis post intervention.     ECHOCARDIOGRAM  ECHOCARDIOGRAM COMPLETE 02/02/2023  Narrative ECHOCARDIOGRAM REPORT    Patient Name:   Trevor [name] [facility] Date of Exam: 02/02/2023 Medical Rec #:  [id number]        Height:       71.0 in Accession #:    [id number]       Weight:       205.9 lb Date of Birth:  [birth year]        BSA:          2.135 m Patient Age:    82 years         BP:           145/70 mmHg Patient Gender: M                HR:           79 bpm. Exam Location:  Inpatient  Procedure: 2D Echo, Cardiac Doppler, Color Doppler and Intracardiac Opacification Agent  Indications:    CHF I50.21  History:        Patient has prior history of Echocardiogram examinations, most recent 08/27/2022. CHF, CAD; Risk Factors:Hypertension, Diabetes and Dyslipidemia.  Sonographer:    Dondra Prader RVT RCS Referring Phys: [id number] OLADAPO ADEFESO  IMPRESSIONS   1. Left ventricular ejection fraction, by estimation, is 45 to 50%. The left ventricle has mildly decreased function. The left ventricle demonstrates regional wall motion abnormalities (see scoring diagram/findings for description). There is mild left ventricular hypertrophy of the basal-septal segment. Left ventricular diastolic parameters are indeterminate. The  average left ventricular global longitudinal strain is -9.3 %. The global longitudinal strain is abnormal. 2. Right ventricular systolic function is normal. The right ventricular size is normal. 3. The mitral valve is normal in structure. No evidence of mitral valve regurgitation. No evidence of mitral stenosis. 4. The aortic valve is tricuspid. Aortic valve regurgitation is not visualized. No aortic stenosis is present. 5. The inferior vena cava is normal in size with greater than 50% respiratory variability, suggesting right atrial pressure of 3 mmHg.  FINDINGS Left Ventricle: Left ventricular ejection fraction, by estimation, is 45 to 50%. The left ventricle has mildly decreased function. The left ventricle demonstrates regional wall motion abnormalities. The average left ventricular global longitudinal strain is -9.3 %. The global longitudinal strain is abnormal. The left ventricular internal cavity size was normal in size. There is mild left ventricular hypertrophy of  the basal-septal segment. Left ventricular diastolic parameters are indeterminate.   LV Wall Scoring: The mid and distal anterior septum, apical anterior segment, apical inferior segment, and apex are akinetic.  Right Ventricle: The right ventricular size is normal. No increase in right ventricular wall thickness. Right ventricular systolic function is normal.  Left Atrium: Left atrial size was normal in size.  Right Atrium: Right atrial size was normal in size.  Pericardium: There is no evidence of pericardial effusion.  Mitral Valve: The mitral valve is normal in structure. No evidence of mitral valve regurgitation. No evidence of mitral valve stenosis.  Tricuspid Valve: The tricuspid valve is normal in structure. Tricuspid valve regurgitation is not demonstrated. No evidence of tricuspid stenosis.  Aortic Valve: The aortic valve is tricuspid. Aortic valve regurgitation is not visualized. No aortic stenosis is present.  Aortic valve mean gradient measures 3.0 mmHg. Aortic valve peak gradient measures 4.7 mmHg. Aortic valve area, by VTI measures 3.37 cm.  Pulmonic Valve: The pulmonic valve was normal in structure. Pulmonic valve regurgitation is trivial. No evidence of pulmonic stenosis.  Aorta: The aortic root is normal in size and structure.  Venous: The inferior vena cava is normal in size with greater than 50% respiratory variability, suggesting right atrial pressure of 3 mmHg.  IAS/Shunts: No atrial level shunt detected by color flow Doppler.   LEFT VENTRICLE PLAX 2D LVIDd:         4.20 cm   Diastology LVIDs:         2.80 cm   LV e' medial:    15.90 cm/s LV PW:         0.90 cm   LV E/e' medial:  9.6 LV IVS:        1.20 cm   LV e' lateral:   18.30 cm/s LVOT diam:     2.10 cm   LV E/e' lateral: 8.3 LV SV:         72 LV SV Index:   34        2D Longitudinal Strain LVOT Area:     3.46 cm  2D Strain GLS Avg:     -9.3 %   RIGHT VENTRICLE            IVC RV S prime:     8.43 cm/s  IVC diam: 2.00 cm TAPSE (M-mode): 2.5 cm  LEFT ATRIUM             Index        RIGHT ATRIUM           Index LA diam:        4.00 cm 1.87 cm/m   RA Area:     13.80 cm LA Vol (A2C):   62.7 ml 29.37 ml/m  RA Volume:   33.00 ml  15.46 ml/m LA Vol (A4C):   64.6 ml 30.26 ml/m LA Biplane Vol: 66.1 ml 30.96 ml/m AORTIC VALVE                    PULMONIC VALVE AV Area (Vmax):    3.12 cm     PV Vmax:       0.98 m/s AV Area (Vmean):   3.15 cm     PV Peak grad:  3.8 mmHg AV Area (VTI):     3.37 cm AV Vmax:           108.00 cm/s AV Vmean:          74.700 cm/s AV VTI:  0.213 m AV Peak Grad:      4.7 mmHg AV Mean Grad:      3.0 mmHg LVOT Vmax:         97.30 cm/s LVOT Vmean:        67.900 cm/s LVOT VTI:          0.207 m LVOT/AV VTI ratio: 0.97  AORTA Ao Asc diam: 3.30 cm  MITRAL VALVE MV Area (PHT): 3.99 cm     SHUNTS MV Decel Time: 190 msec     Systemic VTI:  0.21 m MV E velocity: 152.00 cm/s   Systemic Diam: 2.10 cm  Donato Schultz MD Electronically signed by Donato Schultz MD Signature Date/Time: 02/02/2023/12:07:58 PM    Final            Recent Labs: 02/01/2023: ALT 14; B Natriuretic Peptide 1,259.0; Magnesium 1.9 02/03/2023: BUN 61; Creatinine, Ser 9.50; Hemoglobin 10.6; Platelets 228; Potassium 4.2; Sodium 131; TSH 1.898  Recent Lipid Panel    Component Value Date/Time   CHOL 100 02/03/2023 1410   TRIG 55 02/03/2023 1410   HDL 47 02/03/2023 1410   CHOLHDL 2.1 02/03/2023 1410   VLDL 11 02/03/2023 1410   LDLCALC 42 02/03/2023 1410    History of Present Illness    82 year old male with the above past medical history including prior PCI-mLAD in 08/31/2022, s/p NSTEMI, DES-mLAD (ISR) in 01/2023, chronic systolic heart failure (EF 45 to 50%), ICM, first-degree AV block, hypertension, hyperlipidemia, type 2 diabetes, and ESRD on HD.  He was hospitalized in May 2024 in the setting of NSTEMI and underwent stenting to his mid LAD.  Echocardiogram at the time showed EF greater than 75%, hyperdynamic LV function, no RWMA, moderate LVH, normal RV, no significant valvular abnormalities.  He was started on DAPT with aspirin and Brilinta.  He was not seen for follow-up posthospitalization.  He returned to the ED on 02/01/2023 was hospitalized in the setting of NSTEMI.  Cardiac catheterization revealed severe in-stent restenosis of his mid LAD stent, s/p DES-mid LAD, overlapping prior stent.  ISR was felt to be of the setting of blood adherence to Brilinta (patient reported taking Brilinta only once a day).  Echocardiogram showed EF 45 to 50%, mildly decreased LV function, mild LVH of the basal septal segment, normal RV, no significant valvular abnormalities. He was maintained on DAPT with aspirin and Brilinta, adherence was stressed. He was discharged home in stable condition on 02/03/2023.  He presents today for follow-up. Since his hospitalization he has been stable from a cardiac  standpoint. He denies any symptoms concerning for angina. He denies dyspnea on exertion though he does note some mild orthopnea. He notes that he has had some. He denies any dyspnea, edema, PND, significant weight gain. He denies palpitations, dizziness, presyncope or syncope. Overall, he reports feeling well.  Home Medications    Current Outpatient Medications  Medication Sig Dispense Refill   ACCU-CHEK AVIVA PLUS test strip USE TO TEST BLOOD SUGAR BEFORE BREAKFAST AND AT BEDTIME 200 strip 2   Accu-Chek Softclix Lancets lancets      acetaminophen (TYLENOL) 325 MG tablet Take 1-2 tablets (325-650 mg total) by mouth every 6 (six) hours as needed for mild pain (pain score 1-3 or temp > 100.5).     Alcohol Swabs (B-D SINGLE USE SWABS REGULAR) PADS      aspirin EC 81 MG tablet Take 1 tablet (81 mg total) by mouth daily with breakfast. Swallow whole. 30 tablet 12   calcitRIOL (ROCALTROL)  0.25 MCG capsule Take 5 capsules (1.25 mcg total) by mouth every Monday, Wednesday, and Friday with hemodialysis. 60 capsule 0   carvedilol (COREG) 12.5 MG tablet Take 12.5 mg by mouth 2 (two) times daily with a meal.     Cholecalciferol (VITAMIN D-3 PO) Take 1 tablet by mouth daily.     CRANBERRY EXTRACT PO Take 1 tablet by mouth daily.     hydrALAZINE (APRESOLINE) 50 MG tablet Take 50 mg by mouth 2 (two) times daily.     insulin degludec (TRESIBA FLEXTOUCH) 200 UNIT/ML FlexTouch Pen 10-24 Units as needed (blood sugar). Sliding scale Under 100 Do Not Take     isosorbide mononitrate (IMDUR) 30 MG 24 hr tablet Take 1 tablet (30 mg total) by mouth daily. 30 tablet 2   lactulose (CHRONULAC) 10 GM/15ML solution Take 20 g by mouth daily as needed for moderate constipation.     nitroGLYCERIN (NITROSTAT) 0.4 MG SL tablet Place 1 tablet (0.4 mg total) under the tongue every 5 (five) minutes as needed for chest pain. 25 tablet 3   rosuvastatin (CRESTOR) 10 MG tablet Take 10 mg by mouth every evening.     ticagrelor (BRILINTA)  90 MG TABS tablet Take 1 tablet (90 mg total) by mouth 2 (two) times daily. 60 tablet 11   No current facility-administered medications for this visit.     Review of Systems    He denies chest pain, palpitations, dyspnea, pnd, n, v, dizziness, syncope, edema, weight gain, or early satiety. All other systems reviewed and are otherwise negative except as noted above.     Cardiac Rehabilitation Eligibility Assessment  The patient has declined or is not appropriate for cardiac rehabilitation.    Physical Exam    VS:  BP (!) 122/54 (BP Location: Right Arm, Patient Position: Sitting, Cuff Size: Normal)   Pulse 66   Ht 5\' 11"  (1.803 m)   Wt 200 lb (90.7 kg)   BMI 27.89 kg/m  GEN: Well nourished, well developed, in no acute distress. HEENT: normal. Neck: Supple, no JVD, carotid bruits, or masses. Cardiac: RRR, no murmurs, rubs, or gallops. No clubbing, cyanosis, edema.  Right groin cath site without bruising, bleeding, or hematoma.  Radials/DP/PT 2+ and equal bilaterally.  Respiratory:  Respirations regular and unlabored, clear to auscultation bilaterally. GI: Soft, nontender, nondistended, BS + x 4. MS: no deformity or atrophy. Skin: warm and dry, no rash. Neuro:  Strength and sensation are intact. Psych: Normal affect.  Accessory Clinical Findings    ECG personally reviewed by me today - EKG Interpretation Date/Time:  Tuesday February 09 2023 09:54:47 EDT Ventricular Rate:  66 PR Interval:  406 QRS Duration:  104 QT Interval:  438 QTC Calculation: 459 R Axis:   77  Text Interpretation: Sinus rhythm with 1st degree A-V block Left atrial enlargement Nonspecific ST abnormality Confirmed by Bernadene Person ([id number]) on 02/09/2023 10:08:12 AM  - no acute changes.   Lab Results  Component Value Date   WBC 13.6 (H) 02/03/2023   HGB 10.6 (L) 02/03/2023   HCT 32.5 (L) 02/03/2023   MCV 89.5 02/03/2023   PLT 228 02/03/2023   Lab Results  Component Value Date   CREATININE 9.50 (H)  02/03/2023   BUN 61 (H) 02/03/2023   NA 131 (L) 02/03/2023   K 4.2 02/03/2023   CL 93 (L) 02/03/2023   CO2 22 02/03/2023   Lab Results  Component Value Date   ALT 14 02/01/2023   AST 35 02/01/2023  ALKPHOS 34 (L) 02/01/2023   BILITOT 0.9 02/01/2023   Lab Results  Component Value Date   CHOL 100 02/03/2023   HDL 47 02/03/2023   LDLCALC 42 02/03/2023   TRIG 55 02/03/2023   CHOLHDL 2.1 02/03/2023    Lab Results  Component Value Date   HGBA1C 5.3 02/01/2023    Assessment & Plan   1. CAD: S/p prior PCI-mLAD in 08/31/2022, s/p NSTEMI, DES-mLAD (ISR) in 01/2023. Stable with no anginal symptoms. No indication for ischemic evaluation.  Continue aspirin, Brilinta, carvedilol, hydralazine, Imdur, Crestor.  2. Chronic systolic heart failure/ICM:  Echocardiogram showed EF 45 to 50%, mildly decreased LV function, mild LVH of the basal septal segment, normal RV, no significant valvular abnormalities.  Generally euvolemic, well compensated on exam.  He does note mild orthopnea, denies dyspnea, edema, PND, or significant weight gain.  Plan for repeat echo in 3 months.  Escalation of GDMT limited in the setting of ESRD. Continue current medications as above.  Fluid volume management per HD.  3. First-degree AV block: Asymptomatic. Continue carvedilol.   4. Hypertension: BP well controlled. Continue current antihypertensive regimen.   5. Hyperlipidemia: LDL was 42 in 01/2023.  Continue Crestor.  6. Type 2 diabetes: A1c was 5.3 in 01/2023.  Monitor managed per PCP.  7. ESRD: On HD. Following with nephrology.   8. Disposition: Follow-up in 3 months.      Joylene Grapes, NP 02/09/2023, 10:35 AM

## 2023-02-10 DIAGNOSIS — N2581 Secondary hyperparathyroidism of renal origin: Secondary | ICD-10-CM | POA: Diagnosis not present

## 2023-02-10 DIAGNOSIS — Z992 Dependence on renal dialysis: Secondary | ICD-10-CM | POA: Diagnosis not present

## 2023-02-10 DIAGNOSIS — N186 End stage renal disease: Secondary | ICD-10-CM | POA: Diagnosis not present

## 2023-02-11 ENCOUNTER — Ambulatory Visit: Payer: Self-pay | Admitting: *Deleted

## 2023-02-11 DIAGNOSIS — Z992 Dependence on renal dialysis: Secondary | ICD-10-CM | POA: Diagnosis not present

## 2023-02-11 DIAGNOSIS — N186 End stage renal disease: Secondary | ICD-10-CM | POA: Diagnosis not present

## 2023-02-11 NOTE — Patient Outreach (Signed)
 Care Coordination   Initial Visit Note   07/05/2023 updated note for 02/11/23 Name: Trevor [name] MRN: [id number] DOB: 1941-02-21  ZARON ZWIEFELHOFER is a 82 y.o. year old male who sees [name], [name], MD for primary care. I spoke with  [name] by phone today.  What matters to the patients health and wellness today?  Having poor sleep after Hemodialysis (HD)on 02/10/23 Cough when lie on his right side, can not lie on his back  Compare insurance for better option for 2025   Encourage weighing discussed electrolyte  89.4 after hospital  in hd take off and put back fluids 88 only cleaned blood Cries ? Depression poor focus  To start cardiac rehab but cost concern  now told it was at 100%  Had urinary tract infection (UTI)  Open enrollment 2024 for better insurance option for 2025 Voiced understanding of [employer] [place])    No advance directives. Agreed to be sent POA info  Hd 116,000 his 20% LW not sure HD SW    Goals Addressed             This Visit's Progress    "better control of my health", (CHF, ESRD, DM) (RN CM)       Interventions Today    Flowsheet Row Most Recent Value  Chronic Disease   Chronic disease during today's visit Diabetes, Congestive Heart Failure (CHF), Chronic Kidney Disease/End Stage Renal Disease (ESRD), Hypertension (HTN), Other  [recent cough when lie on right side or back/CHF, ? Best [employer] plan , Cardiac rehab cost]  General Interventions   General Interventions Discussed/Reviewed General Interventions Discussed, Labs, Horticulturist, commercial (DME), Doctor Visits, [employer], Sick Day Rules  [encouraged use of scales to manage CHF]  Labs Hgb A1c every 3 months, Kidney Function  Doctor Visits Discussed/Reviewed Doctor Visits Discussed, Annual Wellness Visits, PCP, Specialist  [encouraged annual wellness prevention, seeing all providers]  Horticulturist, commercial (DME) BP Cuff,  Glucomoter, Other, Environmental consultant, Advertising account planner  PCP/Specialist Visits Compliance with follow-up visit  Exercise Interventions   Exercise Discussed/Reviewed Exercise Discussed, Physical Activity, Weight Managment, Assistive device use and maintanence  Physical Activity Discussed/Reviewed Physical Activity Discussed, Types of exercise, Home Exercise Program (HEP)  [encouraged home exercise and activity]  Weight Management Weight maintenance  [Discussed importance of weight maintenance to manage CHF, prevent symptoms after dialysis, electrolyte changes/fatigue]  Education Interventions   Education Provided Provided Education  Provided Verbal Education On Nutrition, Labs, Blood Sugar Monitoring, Mental Health/Coping with Illness, Exercise, Medication, When to see the doctor, Sick Day Rules, [employer], [employer]  [discussed potassium, sodium, fluids, calcium, encouraged grief resource, CHF action plan, SHIIP to help with 2025 coverage]  Labs Reviewed Hgb A1c, Kidney Function  Mental Health Interventions   Mental Health Discussed/Reviewed Mental Health Discussed, Coping Strategies, Grief and Loss, Depression  [encouraged & discuss care coordination SW counseling, community resources to add to his church & faith resources]  Nutrition Interventions   Nutrition Discussed/Reviewed Nutrition Discussed, Fluid intake, Increasing proteins, Portion sizes, Adding fruits and vegetables, Decreasing fats, Decreasing salt, Decreasing sugar intake  Pharmacy Interventions   Pharmacy Dicussed/Reviewed Pharmacy Topics Discussed, Medications and their functions, Affording Medications  Safety Interventions   Safety Discussed/Reviewed Safety Discussed, Home Safety  Home Safety Assistive Devices  Advanced Directive Interventions   Advanced Directives Discussed/Reviewed Advanced Directives Discussed, Advanced Care Planning  [discussed differences in medical, financial power of attorney  related to coverage  for facilty placement]           Decrease eating fried foods, continue to do whatever it takes this year to remain healthy.       Interventions Today    Flowsheet Row Most Recent Value  Chronic Disease   Chronic disease during today's visit Congestive Heart Failure (CHF), Hypertension (HTN), Diabetes, Chronic Kidney Disease/End Stage Renal Disease (ESRD)  [confirmed he attended cardiac rehab & now continues to complete the exercise at home with his treadmill, walking,  Dialysis is going fine.having frequently BP checks 140/60 average.  He reports he is eating better (no fried foods) Sees HD dietitian]  General Interventions   General Interventions Discussed/Reviewed General Interventions Reviewed, [employer], Doctor Visits  Doctor Visits Discussed/Reviewed Doctor Visits Reviewed, PCP, Specialist  PCP/Specialist Visits Compliance with follow-up visit  Exercise Interventions   Exercise Discussed/Reviewed Exercise Reviewed, Physical Activity, Assistive device use and maintanence  Physical Activity Discussed/Reviewed Physical Activity Reviewed, Gym  Education Interventions   Education Provided Provided Education  [grace period for insurance coverage changes]  Provided Verbal Education On Development worker, community, [employer]  Mental Health Interventions   Mental Health Discussed/Reviewed Mental Health Reviewed, Coping Strategies  Nutrition Interventions   Nutrition Discussed/Reviewed Nutrition Reviewed, Decreasing fats  Pharmacy Interventions   Pharmacy Dicussed/Reviewed Pharmacy Topics Reviewed, Medications and their functions, Affording Medications              SDOH assessments and interventions completed:  Yes  SDOH Interventions Today    Flowsheet Row Most Recent Value  SDOH Interventions   Health Literacy Interventions Intervention Not Indicated        Care Coordination Interventions:  Yes, provided   Follow up plan: Follow up call  scheduled for 02/22/23 1 pm    Encounter Outcome:  Patient Visit Completed   Jaqueline Uber L. Noelle Penner, RN, BSN, [facility]  VBCI Care Management Coordinator  [phone]  Fax: [phone]

## 2023-02-11 NOTE — Patient Instructions (Addendum)
 Visit Information  Thank you for taking time to visit with me today. Please don't hesitate to contact me if I can be of assistance to you.   Following are the goals we discussed today:   Goals Addressed             This Visit's Progress    "better control of my health", (CHF, ESRD, DM) (RN CM)       Interventions Today    Flowsheet Row Most Recent Value  Chronic Disease   Chronic disease during today's visit Diabetes, Congestive Heart Failure (CHF), Chronic Kidney Disease/End Stage Renal Disease (ESRD), Hypertension (HTN), Other  [recent cough when lie on right side or back/CHF, ? [employer] plan , Cardiac rehab cost]  General Interventions   General Interventions Discussed/Reviewed General Interventions Discussed, Labs, Horticulturist, commercial (DME), Doctor Visits, [employer], Sick Day Rules  [encouraged use of scales to manage CHF]  Labs Hgb A1c every 3 months, Kidney Function  Doctor Visits Discussed/Reviewed Doctor Visits Discussed, Annual Wellness Visits, PCP, Specialist  [encouraged annual wellness prevention, seeing all providers]  Horticulturist, commercial (DME) BP Cuff, Glucomoter, Other, Environmental consultant, Advertising account planner  PCP/Specialist Visits Compliance with follow-up visit  Exercise Interventions   Exercise Discussed/Reviewed Exercise Discussed, Physical Activity, Weight Managment, Assistive device use and maintanence  Physical Activity Discussed/Reviewed Physical Activity Discussed, Types of exercise, Home Exercise Program (HEP)  [encouraged home exercise and activity]  Weight Management Weight maintenance  [Discussed importance of weight maintenance to manage CHF, prevent symptoms after dialysis, electrolyte changes/fatigue]  Education Interventions   Education Provided Provided Education  Provided Verbal Education On Nutrition, Labs, Blood Sugar Monitoring, Mental Health/Coping with Illness, Exercise, Medication, When to see the  doctor, Sick Day Rules, [employer], [employer]  [discussed potassium, sodium, fluids, calcium, encouraged grief resource, CHF action plan, SHIIP to help with 2025 coverage]  Labs Reviewed Hgb A1c, Kidney Function  Mental Health Interventions   Mental Health Discussed/Reviewed Mental Health Discussed, Coping Strategies, Grief and Loss, Depression  [encouraged & discuss care coordination SW counseling, community resources to add to his church & faith resources]  Nutrition Interventions   Nutrition Discussed/Reviewed Nutrition Discussed, Fluid intake, Increasing proteins, Portion sizes, Adding fruits and vegetables, Decreasing fats, Decreasing salt, Decreasing sugar intake  Pharmacy Interventions   Pharmacy Dicussed/Reviewed Pharmacy Topics Discussed, Medications and their functions, Affording Medications  Safety Interventions   Safety Discussed/Reviewed Safety Discussed, Home Safety  Home Safety Assistive Devices  Advanced Directive Interventions   Advanced Directives Discussed/Reviewed Advanced Directives Discussed, Advanced Care Planning  [discussed differences in medical, financial power of attorney related to coverage for facilty placement]           Decrease eating fried foods, continue to do whatever it takes this year to remain healthy.       Interventions Today    Flowsheet Row Most Recent Value  Chronic Disease   Chronic disease during today's visit Congestive Heart Failure (CHF), Hypertension (HTN), Diabetes, Chronic Kidney Disease/End Stage Renal Disease (ESRD)  [confirmed he attended cardiac rehab & now continues to complete the exercise at home with his treadmill, walking,  Dialysis is going fine.having frequently BP checks 140/60 average.  He reports he is eating better (no fried foods) Sees HD dietitian]  General Interventions   General Interventions Discussed/Reviewed General Interventions Reviewed, [employer], Doctor Visits  Doctor Visits  Discussed/Reviewed Doctor Visits Reviewed, PCP, Specialist  PCP/Specialist Visits Compliance with follow-up visit  Exercise Interventions  Exercise Discussed/Reviewed Exercise Reviewed, Physical Activity, Assistive device use and maintanence  Physical Activity Discussed/Reviewed Physical Activity Reviewed, Gym  Education Interventions   Education Provided Provided Education  [grace period for insurance coverage changes]  Provided Verbal Education On Development worker, community, [employer]  Mental Health Interventions   Mental Health Discussed/Reviewed Mental Health Reviewed, Coping Strategies  Nutrition Interventions   Nutrition Discussed/Reviewed Nutrition Reviewed, Decreasing fats  Pharmacy Interventions   Pharmacy Dicussed/Reviewed Pharmacy Topics Reviewed, Medications and their functions, Affording Medications              Our next appointment is by telephone on 02/22/23 at 1 pm  Please call the care guide team at [phone] if you need to cancel or reschedule your appointment.   If you are experiencing a Mental Health or Behavioral Health Crisis or need someone to talk to, please call the Suicide and Crisis Lifeline: 988 call the [place] National Suicide Prevention Lifeline: (316)227-5335 or TTY: (312) 862-8687 TTY 726-537-0006) to talk to a trained counselor call 1-800-273-TALK (toll free, 24 hour hotline) call the [facility]: [phone] call 911   No computer access, no preference for copy of AVS      The patient has been provided with contact information for the care management team and has been advised to call with any health related questions or concerns.   Mariska Daffin L. Noelle Penner, RN, BSN, [facility]  VBCI Care Management Coordinator  [phone]  Fax: [phone]

## 2023-02-12 DIAGNOSIS — N2581 Secondary hyperparathyroidism of renal origin: Secondary | ICD-10-CM | POA: Diagnosis not present

## 2023-02-12 DIAGNOSIS — Z992 Dependence on renal dialysis: Secondary | ICD-10-CM | POA: Diagnosis not present

## 2023-02-12 DIAGNOSIS — N186 End stage renal disease: Secondary | ICD-10-CM | POA: Diagnosis not present

## 2023-02-15 DIAGNOSIS — N186 End stage renal disease: Secondary | ICD-10-CM | POA: Diagnosis not present

## 2023-02-15 DIAGNOSIS — Z992 Dependence on renal dialysis: Secondary | ICD-10-CM | POA: Diagnosis not present

## 2023-02-15 DIAGNOSIS — N2581 Secondary hyperparathyroidism of renal origin: Secondary | ICD-10-CM | POA: Diagnosis not present

## 2023-02-17 DIAGNOSIS — N186 End stage renal disease: Secondary | ICD-10-CM | POA: Diagnosis not present

## 2023-02-17 DIAGNOSIS — N2581 Secondary hyperparathyroidism of renal origin: Secondary | ICD-10-CM | POA: Diagnosis not present

## 2023-02-17 DIAGNOSIS — Z992 Dependence on renal dialysis: Secondary | ICD-10-CM | POA: Diagnosis not present

## 2023-02-19 DIAGNOSIS — N186 End stage renal disease: Secondary | ICD-10-CM | POA: Diagnosis not present

## 2023-02-19 DIAGNOSIS — Z992 Dependence on renal dialysis: Secondary | ICD-10-CM | POA: Diagnosis not present

## 2023-02-19 DIAGNOSIS — N2581 Secondary hyperparathyroidism of renal origin: Secondary | ICD-10-CM | POA: Diagnosis not present

## 2023-02-22 ENCOUNTER — Ambulatory Visit: Payer: Self-pay | Admitting: *Deleted

## 2023-02-22 DIAGNOSIS — N2581 Secondary hyperparathyroidism of renal origin: Secondary | ICD-10-CM | POA: Diagnosis not present

## 2023-02-22 DIAGNOSIS — N186 End stage renal disease: Secondary | ICD-10-CM | POA: Diagnosis not present

## 2023-02-22 DIAGNOSIS — Z992 Dependence on renal dialysis: Secondary | ICD-10-CM | POA: Diagnosis not present

## 2023-02-22 NOTE — Patient Outreach (Signed)
  Care Coordination   02/22/2023 Name: Trevor [name] MRN: [id number] DOB: 12-Jan-1941   Care Coordination Outreach Attempts:  An unsuccessful telephone outreach was attempted today to offer the patient information about available care coordination services.  Follow Up Plan:  Additional outreach attempts will be made to offer the patient care coordination information and services.   Encounter Outcome:  No Answer   Care Coordination Interventions:  Yes, provided  sent SMS message   Cala Bradford L. Noelle Penner, RN, BSN, [facility]  VBCI Care Management Coordinator  [phone]  Fax: [phone]

## 2023-02-24 DIAGNOSIS — N2581 Secondary hyperparathyroidism of renal origin: Secondary | ICD-10-CM | POA: Diagnosis not present

## 2023-02-24 DIAGNOSIS — N186 End stage renal disease: Secondary | ICD-10-CM | POA: Diagnosis not present

## 2023-02-24 DIAGNOSIS — Z992 Dependence on renal dialysis: Secondary | ICD-10-CM | POA: Diagnosis not present

## 2023-02-25 ENCOUNTER — Telehealth: Payer: Self-pay | Admitting: Internal Medicine

## 2023-02-25 DIAGNOSIS — R0602 Shortness of breath: Secondary | ICD-10-CM | POA: Diagnosis not present

## 2023-02-25 DIAGNOSIS — I10 Essential (primary) hypertension: Secondary | ICD-10-CM | POA: Diagnosis not present

## 2023-02-25 DIAGNOSIS — I252 Old myocardial infarction: Secondary | ICD-10-CM | POA: Diagnosis not present

## 2023-02-25 DIAGNOSIS — Z79899 Other long term (current) drug therapy: Secondary | ICD-10-CM | POA: Diagnosis not present

## 2023-02-25 DIAGNOSIS — R0600 Dyspnea, unspecified: Secondary | ICD-10-CM | POA: Insufficient documentation

## 2023-02-25 NOTE — Telephone Encounter (Signed)
Patient identification verified by 2 forms. [name], RN   Called and spoke to patient states  Patient states:   -has SOB of breath when lying down at night   -unable to lay on right side due to dialysis   -has dialysis MWF   -sleeps on two pillows and still has SOB   -has to be sitting up in bed   -on going for 1.5 weeks   -called PCP office and reported symptoms   -has plan to see primary care provider at 2pm  Patient denies:   -SOB/difficulty breathing at rest  Advised patient to keep appointment with PCP to follow up  Informed patient message sent to Cardiologist for input/advisement  Patient verbalized understanding, no questions at this time

## 2023-02-25 NOTE — Telephone Encounter (Signed)
Patient says that he can't lay down due to having SOB at night. Says that during the day time, says that he don't have that problem

## 2023-02-26 DIAGNOSIS — N186 End stage renal disease: Secondary | ICD-10-CM | POA: Diagnosis not present

## 2023-02-26 DIAGNOSIS — N2581 Secondary hyperparathyroidism of renal origin: Secondary | ICD-10-CM | POA: Diagnosis not present

## 2023-02-26 DIAGNOSIS — Z992 Dependence on renal dialysis: Secondary | ICD-10-CM | POA: Diagnosis not present

## 2023-02-26 NOTE — Telephone Encounter (Signed)
Mallipeddi, Trevor P, MD  You1 hour ago (12:32 PM)   He is ESRD DD. Not on diuretics at home. Check weights and probably he will benefit from additional fluid removal during dialysis.  _________________________________________________________ Patient identification verified by 2 forms. [name], RN    Called and spoke to patient  Patient states:   -Had dialysis today   -extra fluid was removed   -has nephrology follow up 03/16/23  -saw PCP yesterday   -Spoke to nephrology today received prescription for ondansetron   -Nephrology advised he follow up with cardiology  Relayed provider message  Patient scheduled for OV 11/19 at 2:45pm  Reviewed ED warning signs/precautions  Patient verbalized understanding, no questions at this time

## 2023-02-28 NOTE — Progress Notes (Unsigned)
Cardiology Clinic Note   Patient Name: Trevor [name] Date of Encounter: 03/02/2023  Primary Care Provider:  Benita Stabile, MD Primary Cardiologist:  Marjo Bicker, MD  Patient Profile    Trevor [name] 82 year old male presents the clinic today for follow-up evaluation of his, shortness of breath, essential hypertension and coronary artery disease.  Past Medical History    Past Medical History:  Diagnosis Date   Anemia    Chronic kidney disease    Stage 4?    Diabetes mellitus without complication (HCC)    Hepatitis    not sure what type - over 20 years ago   History of kidney stones    Hyperlipidemia    Hypertension    Past Surgical History:  Procedure Laterality Date   AV FISTULA PLACEMENT Left 08/09/2019   Procedure: LEFT ARM Basilic  ARTERIOVENOUS  FISTULA CREATION;  Surgeon: Maeola Harman, MD;  Location: [facility] OR;  Service: Vascular;  Laterality: Left;   AV FISTULA PLACEMENT Left 12/17/2020   Procedure: INSERTION OF ARTERIOVENOUS (AV) GORE-TEX GRAFT LEFT ARM;  Surgeon: Maeola Harman, MD;  Location: [facility] OR;  Service: Vascular;  Laterality: Left;   BASCILIC VEIN TRANSPOSITION Left 11/30/2019   Procedure: LEFT SECOND STAGE BASCILIC VEIN TRANSPOSITION;  Surgeon: Maeola Harman, MD;  Location: [facility] OR;  Service: Vascular;  Laterality: Left;   COLONOSCOPY N/A 07/07/2017   Procedure: COLONOSCOPY;  Surgeon: West Bali, MD;  Location: [facility];  Service: Endoscopy;  Laterality: N/A;  8:30   CORONARY ATHERECTOMY N/A 08/28/2022   Procedure: CORONARY ATHERECTOMY;  Surgeon: Orbie Pyo, MD;  Location: [facility] LAB;  Service: Cardiovascular;  Laterality: N/A;   CORONARY IMAGING/OCT N/A 08/28/2022   Procedure: CORONARY IMAGING/OCT;  Surgeon: Orbie Pyo, MD;  Location: [facility];  Service: Cardiovascular;  Laterality: N/A;   CORONARY LITHOTRIPSY N/A 02/02/2023   Procedure: CORONARY LITHOTRIPSY;  Surgeon:  Elder Negus, MD;  Location: [facility];  Service: Cardiovascular;  Laterality: N/A;   CORONARY PRESSURE/FFR STUDY N/A 08/28/2022   Procedure: CORONARY PRESSURE/FFR STUDY;  Surgeon: Orbie Pyo, MD;  Location: [facility];  Service: Cardiovascular;  Laterality: N/A;   CORONARY STENT INTERVENTION N/A 08/28/2022   Procedure: CORONARY STENT INTERVENTION;  Surgeon: Orbie Pyo, MD;  Location: [facility];  Service: Cardiovascular;  Laterality: N/A;   CORONARY STENT INTERVENTION N/A 02/02/2023   Procedure: CORONARY STENT INTERVENTION;  Surgeon: Elder Negus, MD;  Location: [facility];  Service: Cardiovascular;  Laterality: N/A;   CORONARY ULTRASOUND/IVUS N/A 02/02/2023   Procedure: Coronary Ultrasound/IVUS;  Surgeon: Elder Negus, MD;  Location: [facility];  Service: Cardiovascular;  Laterality: N/A;   IR FLUORO GUIDE CV LINE RIGHT  09/06/2020   IR US GUIDE VASC ACCESS RIGHT  09/06/2020   LEFT HEART CATH AND CORONARY ANGIOGRAPHY N/A 08/28/2022   Procedure: LEFT HEART CATH AND CORONARY ANGIOGRAPHY;  Surgeon: Orbie Pyo, MD;  [facility]: [facility];  Service: Cardiovascular;  Laterality: N/A;   LEFT HEART CATH AND CORONARY ANGIOGRAPHY N/A 02/02/2023   Procedure: LEFT HEART CATH AND CORONARY ANGIOGRAPHY;  Surgeon: Elder Negus, MD;  Location: [facility];  Service: Cardiovascular;  Laterality: N/A;   TOTAL HIP ARTHROPLASTY Right 10/09/2020   Procedure: TOTAL HIP ARTHROPLASTY ANTERIOR APPROACH;  Surgeon: [name], MD;  Location: [facility] OR;  Service: Orthopedics;  Laterality: Right;  TOTAL HIP ARTHROPLASTY Left 07/23/2022   Procedure: TOTAL HIP ARTHROPLASTY ANTERIOR APPROACH;  Surgeon: [name], MD;  Location: [facility] OR;  Service: Orthopedics;  Laterality: Left;    Allergies  No Known Allergies  History of Present Illness    Trevor [name] has a PMH of coronary artery disease status post cardiac  catheterization with PCI to his mid LAD 5/24, NSTEMI with PCI and DES to his mid LAD (ISR) 10/24, chronic systolic CHF with an EF of 45-50%, ICM, first-degree AV block, HTN, HLD, type 2 diabetes, and end-stage renal disease.  He is on HD.  He was seen in follow-up by Bernadene Person NP on 02/09/2023.  He was post PCI with DES.  He had been hospitalized 5/24 in the setting of NSTEMI and underwent stenting to his mid LAD.  His echocardiogram showed an EF greater than 75% with no significant valvular abnormalities.  He was started on aspirin and Brilinta.  He was not seen for follow-up posthospitalization.  He returned to the emergency department 02/01/2023 in the setting of NSTEMI.  He underwent repeat cardiac catheterization which showed severe in-stent restenosis of his mid LAD stent.  He received overlapping stent in his mid LAD.  His ISR was felt to be in the setting of nonadherence to his Brilinta.  He had only been taking his Brilinta daily.  His echocardiogram at that time showed an EF of 45-50%, mild LVH of the basal septal segment, and no significant valvular abnormalities.  He was discharged 02/03/2023.  On follow-up 02/09/2023 he was stable from a cardiac standpoint.  He denied anginal symptoms.  He denied DOE but did note some mild orthopnea.  He denied significant weight gain, palpitations, presyncope and syncope.  He presents to the clinic today for follow-up evaluation and states he continues to feel shortness of breath and queasiness in his stomach with laying down.  He reports compliance with hemodialysis Monday Wednesday Friday.  His weight today is down 3 pounds.  He denies lower extremity swelling and excessive fluid intake.  We reviewed his previous clinic visit and his cardiac catheterization.  He expressed understanding.  We reviewed his recent lab work which shows a downtrend in his hemoglobin.  I will repeat his CBC, have him increase the iron in his diet and plan follow-up as  scheduled.  Today he denies chest pain,  lower extremity edema, fatigue, palpitations, melena, hematuria, hemoptysis, diaphoresis, weakness, presyncope, syncope, and PND.       Home Medications    Prior to Admission medications   Medication Sig Start Date End Date Taking? Authorizing Provider  ACCU-CHEK AVIVA PLUS test strip USE TO TEST BLOOD SUGAR BEFORE BREAKFAST AND AT BEDTIME 05/26/21   Roma Kayser, MD  Accu-Chek Softclix Lancets lancets  09/13/19   [provider]  acetaminophen (TYLENOL) 325 MG tablet Take 1-2 tablets (325-650 mg total) by mouth every 6 (six) hours as needed for mild pain (pain score 1-3 or temp > 100.5). 07/28/22   Meredeth Ide, MD  Alcohol Swabs (B-D SINGLE USE SWABS REGULAR) PADS  08/11/19   [provider]  aspirin EC 81 MG tablet Take 1 tablet (81 mg total) by mouth daily with breakfast. Swallow whole. 08/30/22   Barrett, Joline Salt, PA-C  calcitRIOL (ROCALTROL) 0.25 MCG capsule Take 5 capsules (1.25 mcg total) by mouth every Monday, Wednesday, and Friday with hemodialysis. 02/03/23   Osvaldo Shipper, MD  carvedilol (COREG) 12.5 MG tablet Take 12.5 mg by mouth 2 (two)  times daily with a meal. 10/18/19   [provider]  Cholecalciferol (VITAMIN D-3 PO) Take 1 tablet by mouth daily.    [provider]  CRANBERRY EXTRACT PO Take 1 tablet by mouth daily.    [provider]  hydrALAZINE (APRESOLINE) 50 MG tablet Take 50 mg by mouth 2 (two) times daily.    [provider]  insulin degludec (TRESIBA FLEXTOUCH) 200 UNIT/ML FlexTouch Pen 10-24 Units as needed (blood sugar). Sliding scale Under 100 Do Not Take    [provider]  isosorbide mononitrate (IMDUR) 30 MG 24 hr tablet Take 1 tablet (30 mg total) by mouth daily. 02/03/23   Osvaldo Shipper, MD  lactulose (CHRONULAC) 10 GM/15ML solution Take 20 g by mouth daily as needed for moderate constipation. 08/21/22   [provider]  nitroGLYCERIN  (NITROSTAT) 0.4 MG SL tablet Place 1 tablet (0.4 mg total) under the tongue every 5 (five) minutes as needed for chest pain. 08/29/22   Barrett, Joline Salt, PA-C  rosuvastatin (CRESTOR) 10 MG tablet Take 10 mg by mouth every evening. 01/26/17   [provider]  ticagrelor (BRILINTA) 90 MG TABS tablet Take 1 tablet (90 mg total) by mouth 2 (two) times daily. 02/03/23   Osvaldo Shipper, MD    Family History    Family History  Problem Relation Age of Onset   Diabetes Mother    Diabetes Brother    Heart attack Brother    Colon cancer Son    Lung cancer Son    He indicated that his mother is deceased. He indicated that his father is deceased. He indicated that his sister is alive. He indicated that all of his four brothers are deceased. He indicated that his daughter is deceased. He indicated that his son is alive.  Social History    Social History   Socioeconomic History   Marital status: Married    Spouse name: Not on file   Number of children: Not on file   Years of education: Not on file   Highest education level: Not on file  Occupational History   Not on file  Tobacco Use   Smoking status: Never   Smokeless tobacco: Never  Vaping Use   Vaping status: Never Used  Substance and Sexual Activity   Alcohol use: Never   Drug use: Never   Sexual activity: Not on file  Other Topics Concern   Not on file  Social History Narrative   Not on file   Social Determinants of Health   Financial Resource Strain: Not on file  Food Insecurity: No Food Insecurity (02/02/2023)   Hunger Vital Sign    Worried About Running Out of Food in the Last Year: Never true    Ran Out of Food in the Last Year: Never true  Transportation Needs: No Transportation Needs (08/28/2022)   PRAPARE - Administrator, Civil Service (Medical): No    Lack of Transportation (Non-Medical): No  Physical Activity: Not on file  Stress: Not on file  Social Connections: Not on file  Intimate  Partner Violence: Not At Risk (02/02/2023)   Humiliation, Afraid, Rape, and Kick questionnaire    Fear of Current or Ex-Partner: No    Emotionally Abused: No    Physically Abused: No    Sexually Abused: No     Review of Systems    General:  No chills, fever, night sweats or weight changes.  Cardiovascular:  No chest pain, dyspnea on exertion, edema,  orthopnea, palpitations, paroxysmal nocturnal dyspnea. Dermatological: No rash, lesions/masses Respiratory: No cough, dyspnea Urologic: No hematuria, dysuria Abdominal:   No nausea, vomiting, diarrhea, bright red blood per rectum, melena, or hematemesis Neurologic:  No visual changes, wkns, changes in mental status. All other systems reviewed and are otherwise negative except as noted above.  Physical Exam    VS:  BP (!) 136/58   Pulse 72   Ht 5\' 11"  (1.803 m)   Wt 197 lb (89.4 kg)   BMI 27.48 kg/m  , BMI Body mass index is 27.48 kg/m. GEN: Well nourished, well developed, in no acute distress. HEENT: normal. Neck: Supple, no JVD, carotid bruits, or masses. Cardiac: RRR, no murmurs, rubs, or gallops. No clubbing, cyanosis, edema.  Radials/DP/PT 2+ and equal bilaterally.  Respiratory:  Respirations regular and unlabored, clear to auscultation bilaterally. GI: Soft, nontender, nondistended, BS + x 4. MS: no deformity or atrophy. Skin: warm and dry, no rash. Neuro:  Strength and sensation are intact. Psych: Normal affect.  Accessory Clinical Findings    Recent Labs: 02/01/2023: ALT 14; B Natriuretic Peptide 1,259.0; Magnesium 1.9 02/03/2023: BUN 61; Creatinine, Ser 9.50; Hemoglobin 10.6; Platelets 228; Potassium 4.2; Sodium 131; TSH 1.898   Recent Lipid Panel    Component Value Date/Time   CHOL 100 02/03/2023 1410   TRIG 55 02/03/2023 1410   HDL 47 02/03/2023 1410   CHOLHDL 2.1 02/03/2023 1410   VLDL 11 02/03/2023 1410   LDLCALC 42 02/03/2023 1410         ECG personally reviewed by me today- EKG  Interpretation Date/Time:  Tuesday March 02 2023 14:45:26 EST Ventricular Rate:  72 PR Interval:  432 QRS Duration:  86 QT Interval:  396 QTC Calculation: 433 R Axis:   56  Text Interpretation: Sinus rhythm with 1st degree A-V block ST & T wave abnormality, consider anterolateral ischemia Confirmed by Edd Fabian [phone]) on 03/02/2023 2:47:42 PM   Echocardiogram 02/02/2023  IMPRESSIONS     1. Left ventricular ejection fraction, by estimation, is 45 to 50%. The  left ventricle has mildly decreased function. The left ventricle  demonstrates regional wall motion abnormalities (see scoring  diagram/findings for description). There is mild left  ventricular hypertrophy of the basal-septal segment. Left ventricular  diastolic parameters are indeterminate. The average left ventricular  global longitudinal strain is -9.3 %. The global longitudinal strain is  abnormal.   2. Right ventricular systolic function is normal. The right ventricular  size is normal.   3. The mitral valve is normal in structure. No evidence of mitral valve  regurgitation. No evidence of mitral stenosis.   4. The aortic valve is tricuspid. Aortic valve regurgitation is not  visualized. No aortic stenosis is present.   5. The inferior vena cava is normal in size with greater than 50%  respiratory variability, suggesting right atrial pressure of 3 mmHg.   FINDINGS   Left Ventricle: Left ventricular ejection fraction, by estimation, is 45  to 50%. The left ventricle has mildly decreased function. The left  ventricle demonstrates regional wall motion abnormalities. The average  left ventricular global longitudinal  strain is -9.3 %. The global longitudinal strain is abnormal. The left  ventricular internal cavity size was normal in size. There is mild left  ventricular hypertrophy of the basal-septal segment. Left ventricular  diastolic parameters are indeterminate.     LV Wall Scoring:  The mid and  distal anterior septum, apical anterior segment, apical  inferior  segment, and apex are  akinetic.   Right Ventricle: The right ventricular size is normal. No increase in  right ventricular wall thickness. Right ventricular systolic function is  normal.   Left Atrium: Left atrial size was normal in size.   Right Atrium: Right atrial size was normal in size.   Pericardium: There is no evidence of pericardial effusion.   Mitral Valve: The mitral valve is normal in structure. No evidence of  mitral valve regurgitation. No evidence of mitral valve stenosis.   Tricuspid Valve: The tricuspid valve is normal in structure. Tricuspid  valve regurgitation is not demonstrated. No evidence of tricuspid  stenosis.   Aortic Valve: The aortic valve is tricuspid. Aortic valve regurgitation is  not visualized. No aortic stenosis is present. Aortic valve mean gradient  measures 3.0 mmHg. Aortic valve peak gradient measures 4.7 mmHg. Aortic  valve area, by VTI measures 3.37  cm.   Pulmonic Valve: The pulmonic valve was normal in structure. Pulmonic valve  regurgitation is trivial. No evidence of pulmonic stenosis.   Aorta: The aortic root is normal in size and structure.   Venous: The inferior vena cava is normal in size with greater than 50%  respiratory variability, suggesting right atrial pressure of 3 mmHg.   IAS/Shunts: No atrial level shunt detected by color flow Doppler.    Cardiac catheterization 02/02/2023   Coronary angiography 02/02/2023: LM: Mid 30% stenosis (Unchanged form 08/2022) LAD: Ostial-prox 30% disease (Unchanged form 08/2022)          Mid LAD 99% ISR with severe calcification (New since 08/2022)          Mid LAD myocardial bridge (distal to the stents), accentuated by IC nitroglycerin Lcx: Prox 50% disease with moderate calcification (Unchanged form 08/2022) RCA: Prox 30% disease (Unchanged form 08/2022)   LVEDP normal   Successful percutaneous coronary intervention  prox-mid LAD     Intravascular ultrasound (IVUS)     Shockwave lithotripsy 3.0X12 mm balloon X12 cycles     PTCA and stent placement 3.0X18 mm Onyx DES to in mid-prox LAD overlapping with prior mid LAD stent   Pre-PCI IVUS MSA 3.1 mm1 Post PCI IVUS MSA 5.4 mm2 in the distal part of the old stent which was further postdilated.  Proximal part of the new stent and overlap of two stents had MSA of 7.9 mm.      Elder Negus, MD    Assessment & Plan   1.  Shortness of breath-noted to have mild orthopnea today.  Weight today 197.  No lower extremity edema.  Recent echocardiogram showed EF of 45-50%, mild LVH of basal septal segment, and no significant valvular abnormalities.  Denies recent sick contacts and illness.  Contacted nurse triage line on 02/25/2023 and reported that he was having increased shortness of breath at night.  He was having trouble with laying down.  Reports compliance with HD. Appears that he may be developing symptoms from anemia.  I will repeat his CBC and plan to refer to GI if his hemoglobin continues to fall.   Low-sodium diet Cbc Increase Iron food  Chronic systolic CHF-euvolemic.  Weight today 197 pounds. Continue carvedilol, hydralazine, Imdur Low-sodium diet Fluid volume managed by HD  Coronary artery disease-denies anginal symptoms.  Reports compliance with dual antiplatelet therapy Heart healthy low-sodium diet Continue current medication regimen Increase physical activity as tolerated  End-stage renal disease-creatinine 9.50 on 02/03/2023. Compliant with dialysis Monday Wednesday Friday Follows with nephrology  Disposition: follow up with Dr. Jenene Slicker as scheduled.  Thomasene Ripple. Petra Dumler NP-C     03/02/2023, 3:07 PM [facility] [address] Office [phone] Fax [phone]    I spent 14 minutes examining this patient, reviewing medications, and using patient centered shared decision making  involving her cardiac care.   I spent greater than 20 minutes reviewing her past medical history,  medications, and prior cardiac tests.

## 2023-03-01 DIAGNOSIS — N186 End stage renal disease: Secondary | ICD-10-CM | POA: Diagnosis not present

## 2023-03-01 DIAGNOSIS — Z992 Dependence on renal dialysis: Secondary | ICD-10-CM | POA: Diagnosis not present

## 2023-03-01 DIAGNOSIS — N2581 Secondary hyperparathyroidism of renal origin: Secondary | ICD-10-CM | POA: Diagnosis not present

## 2023-03-02 ENCOUNTER — Ambulatory Visit: Payer: Medicare HMO | Attending: General Practice | Admitting: General Practice

## 2023-03-02 ENCOUNTER — Encounter: Payer: Self-pay | Admitting: General Practice

## 2023-03-02 VITALS — BP 136/58 | HR 72 | Ht 71.0 in | Wt 197.0 lb

## 2023-03-02 DIAGNOSIS — N186 End stage renal disease: Secondary | ICD-10-CM

## 2023-03-02 DIAGNOSIS — I5022 Chronic systolic (congestive) heart failure: Secondary | ICD-10-CM

## 2023-03-02 DIAGNOSIS — Z992 Dependence on renal dialysis: Secondary | ICD-10-CM | POA: Diagnosis not present

## 2023-03-02 DIAGNOSIS — I2510 Atherosclerotic heart disease of native coronary artery without angina pectoris: Secondary | ICD-10-CM | POA: Diagnosis not present

## 2023-03-02 DIAGNOSIS — R0609 Other forms of dyspnea: Secondary | ICD-10-CM

## 2023-03-02 NOTE — Patient Instructions (Addendum)
Medication Instructions:  No changes    *If you need a refill on your cardiac medications before your next appointment, please call your pharmacy*   Lab Work: Your physician recommends that you have the following labwork:  - CBC   If you have labs (blood work) drawn today and your tests are completely normal, you will receive your results only by: MyChart Message (if you have MyChart) OR A paper copy in the mail If you have any lab test that is abnormal or we need to change your treatment, we will call you to review the results.   Testing/Procedures: None    Follow-Up: At [facility], you and your health needs are our priority.  As part of our continuing mission to provide you with exceptional heart care, we have created designated Provider Care Teams.  These Care Teams include your primary Cardiologist (physician) and Advanced Practice Providers (APPs -  Physician Assistants and Nurse Practitioners) who all work together to provide you with the care you need, when you need it.  We recommend signing up for the patient portal called "MyChart".  Sign up information is provided on this After Visit Summary.  MyChart is used to connect with patients for Virtual Visits (Telemedicine).  Patients are able to view lab/test results, encounter notes, upcoming appointments, etc.  Non-urgent messages can be sent to your provider as well.   To learn more about what you can do with MyChart, go to [email or web address].    Your next appointment:   Keep next appointment with Dr. Jenene Slicker on April 27, 2023 at 10:40am  The format for your next appointment:   In Person   Other Instructions Edd Fabian has recommended eating a diet that includes Iron rich foods.

## 2023-03-03 DIAGNOSIS — Z992 Dependence on renal dialysis: Secondary | ICD-10-CM | POA: Diagnosis not present

## 2023-03-03 DIAGNOSIS — N186 End stage renal disease: Secondary | ICD-10-CM | POA: Diagnosis not present

## 2023-03-03 DIAGNOSIS — N2581 Secondary hyperparathyroidism of renal origin: Secondary | ICD-10-CM | POA: Diagnosis not present

## 2023-03-04 ENCOUNTER — Encounter (HOSPITAL_COMMUNITY)
Admission: RE | Admit: 2023-03-04 | Discharge: 2023-03-04 | Disposition: A | Discharge status: Home / Self Care | Payer: Medicare HMO | Source: Ambulatory Visit | Attending: Internal Medicine | Admitting: Internal Medicine

## 2023-03-04 VITALS — Ht 70.0 in | Wt 195.9 lb

## 2023-03-04 DIAGNOSIS — I214 Non-ST elevation (NSTEMI) myocardial infarction: Secondary | ICD-10-CM | POA: Insufficient documentation

## 2023-03-04 DIAGNOSIS — Z955 Presence of coronary angioplasty implant and graft: Secondary | ICD-10-CM | POA: Insufficient documentation

## 2023-03-04 NOTE — Patient Instructions (Signed)
Patient Instructions  Patient Details  Name: Trevor [name] MRN: [id number] Date of Birth: 05/29/1940 Referring Provider:  Benita Stabile, MD  Below are your personal goals for exercise, nutrition, and risk factors. Our goal is to help you stay on track towards obtaining and maintaining these goals. We will be discussing your progress on these goals with you throughout the program.  Initial Exercise Prescription:  Initial Exercise Prescription - 03/04/23 1500       Date of Initial Exercise RX and Referring Provider   Date 03/04/23    Referring Provider Richardo Priest MD   Primary Cardiologist Dr.Vishnu Mallipeddi     Oxygen   Maintain Oxygen Saturation 88% or higher      Treadmill   MPH 1.2    Grade 0.5    Minutes 15    METs 2      NuStep   Level 2    SPM 80    Minutes 15    METs 2      Prescription Details   Frequency (times per week) 2    Duration Progress to 30 minutes of continuous aerobic without signs/symptoms of physical distress      Intensity   THRR 40-80% of Max Heartrate 102-127    Ratings of Perceived Exertion 11-13    Perceived Dyspnea 0-4      Progression   Progression Continue to progress workloads to maintain intensity without signs/symptoms of physical distress.      Resistance Training   Training Prescription Yes    Weight 3 lb    Reps 10-15             Exercise Goals: Frequency: Be able to perform aerobic exercise two to three times per week in program working toward 2-5 days per week of home exercise.  Intensity: Work with a perceived exertion of 11 (fairly light) - 15 (hard) while following your exercise prescription.  We will make changes to your prescription with you as you progress through the program.   Duration: Be able to do 30 to 45 minutes of continuous aerobic exercise in addition to a 5 minute warm-up and a 5 minute cool-down routine.   Nutrition Goals: Your personal nutrition goals will be established when you do your  nutrition analysis with the dietician.  The following are general nutrition guidelines to follow: Cholesterol < 200mg /day Sodium < 1500mg /day Fiber: Men over 50 yrs - 30 grams per day  Personal Goals:  Personal Goals and Risk Factors at Admission - 03/04/23 1615       Core Components/Risk Factors/Patient Goals on Admission    Weight Management Yes;Weight Loss    Intervention Weight Management: Develop a combined nutrition and exercise program designed to reach desired caloric intake, while maintaining appropriate intake of nutrient and fiber, sodium and fats, and appropriate energy expenditure required for the weight goal.;Weight Management: Provide education and appropriate resources to help participant work on and attain dietary goals.;Weight Management/Obesity: Establish reasonable short term and long term weight goals.    Admit Weight 195 lb 14.4 oz (88.9 kg)    Goal Weight: Short Term 190 lb (86.2 kg)    Goal Weight: Long Term 190 lb (86.2 kg)    Expected Outcomes Long Term: Adherence to nutrition and physical activity/exercise program aimed toward attainment of established weight goal;Short Term: Continue to assess and modify interventions until short term weight is achieved;Weight Maintenance: Understanding of the daily nutrition guidelines, which includes 25-35% calories from fat, 7% or less cal  from saturated fats, less than 200mg  cholesterol, less than 1.5gm of sodium, & 5 or more servings of fruits and vegetables daily;Weight Loss: Understanding of general recommendations for a balanced deficit meal plan, which promotes 1-2 lb weight loss per week and includes a negative energy balance of [phone] kcal/d;Understanding recommendations for meals to include 15-35% energy as protein, 25-35% energy from fat, 35-60% energy from carbohydrates, less than 200mg  of dietary cholesterol, 20-35 gm of total fiber daily;Understanding of distribution of calorie intake throughout the day with the  consumption of 4-5 meals/snacks    Improve shortness of breath with ADL's Yes    Intervention Provide education, individualized exercise plan and daily activity instruction to help decrease symptoms of SOB with activities of daily living.    Expected Outcomes Short Term: Improve cardiorespiratory fitness to achieve a reduction of symptoms when performing ADLs;Long Term: Be able to perform more ADLs without symptoms or delay the onset of symptoms    Diabetes Yes   A1c now 5.3   Intervention Provide education about signs/symptoms and action to take for hypo/hyperglycemia.;Provide education about proper nutrition, including hydration, and aerobic/resistive exercise prescription along with prescribed medications to achieve blood glucose in normal ranges: Fasting glucose 65-99 mg/dL    Expected Outcomes Long Term: Attainment of HbA1C < 7%.;Short Term: Participant verbalizes understanding of the signs/symptoms and immediate care of hyper/hypoglycemia, proper foot care and importance of medication, aerobic/resistive exercise and nutrition plan for blood glucose control.    Heart Failure Yes    Intervention Provide a combined exercise and nutrition program that is supplemented with education, support and counseling about heart failure. Directed toward relieving symptoms such as shortness of breath, decreased exercise tolerance, and extremity edema.    Expected Outcomes Improve functional capacity of life;Short term: Attendance in program 2-3 days a week with increased exercise capacity. Reported lower sodium intake. Reported increased fruit and vegetable intake. Reports medication compliance.;Short term: Daily weights obtained and reported for increase. Utilizing diuretic protocols set by physician.;Long term: Adoption of self-care skills and reduction of barriers for early signs and symptoms recognition and intervention leading to self-care maintenance.    Hypertension Yes    Intervention Provide education on  lifestyle modifcations including regular physical activity/exercise, weight management, moderate sodium restriction and increased consumption of fresh fruit, vegetables, and low fat dairy, alcohol moderation, and smoking cessation.;Monitor prescription use compliance.    Expected Outcomes Short Term: Continued assessment and intervention until BP is < 140/65mm HG in hypertensive participants. < 130/63mm HG in hypertensive participants with diabetes, heart failure or chronic kidney disease.;Long Term: Maintenance of blood pressure at goal levels.    Lipids Yes    Intervention Provide education and support for participant on nutrition & aerobic/resistive exercise along with prescribed medications to achieve LDL 70mg , HDL >40mg .    Expected Outcomes Short Term: Participant states understanding of desired cholesterol values and is compliant with medications prescribed. Participant is following exercise prescription and nutrition guidelines.;Long Term: Cholesterol controlled with medications as prescribed, with individualized exercise RX and with personalized nutrition plan. Value goals: LDL < 70mg , HDL > 40 mg.             Tobacco Use Initial Evaluation: Social History   Tobacco Use  Smoking Status Never  Smokeless Tobacco Never    Exercise Goals and Review:  Exercise Goals     Row Name 03/04/23 1602             Exercise Goals   Increase Physical Activity Yes  Intervention Provide advice, education, support and counseling about physical activity/exercise needs.;Develop an individualized exercise prescription for aerobic and resistive training based on initial evaluation findings, risk stratification, comorbidities and participant's personal goals.       Expected Outcomes Short Term: Attend rehab on a regular basis to increase amount of physical activity.;Long Term: Add in home exercise to make exercise part of routine and to increase amount of physical activity.;Long Term:  Exercising regularly at least 3-5 days a week.       Increase Strength and Stamina Yes       Intervention Provide advice, education, support and counseling about physical activity/exercise needs.;Develop an individualized exercise prescription for aerobic and resistive training based on initial evaluation findings, risk stratification, comorbidities and participant's personal goals.       Expected Outcomes Short Term: Increase workloads from initial exercise prescription for resistance, speed, and METs.;Short Term: Perform resistance training exercises routinely during rehab and add in resistance training at home;Long Term: Improve cardiorespiratory fitness, muscular endurance and strength as measured by increased METs and functional capacity ( )       Able to understand and use rate of perceived exertion (RPE) scale Yes       Intervention Provide education and explanation on how to use RPE scale       Expected Outcomes Short Term: Able to use RPE daily in rehab to express subjective intensity level;Long Term:  Able to use RPE to guide intensity level when exercising independently       Able to understand and use Dyspnea scale Yes       Intervention Provide education and explanation on how to use Dyspnea scale       Expected Outcomes Short Term: Able to use Dyspnea scale daily in rehab to express subjective sense of shortness of breath during exertion;Long Term: Able to use Dyspnea scale to guide intensity level when exercising independently       Knowledge and understanding of Target Heart Rate Range (THRR) Yes       Intervention Provide education and explanation of THRR including how the numbers were predicted and where they are located for reference       Expected Outcomes Short Term: Able to state/look up THRR;Long Term: Able to use THRR to govern intensity when exercising independently;Short Term: Able to use daily as guideline for intensity in rehab       Able to check pulse independently Yes        Intervention Provide education and demonstration on how to check pulse in carotid and radial arteries.;Review the importance of being able to check your own pulse for safety during independent exercise       Expected Outcomes Short Term: Able to explain why pulse checking is important during independent exercise;Long Term: Able to check pulse independently and accurately       Understanding of Exercise Prescription Yes       Intervention Provide education, explanation, and written materials on patient's individual exercise prescription       Expected Outcomes Short Term: Able to explain program exercise prescription;Long Term: Able to explain home exercise prescription to exercise independently              Copy of goals given to participant.

## 2023-03-04 NOTE — Progress Notes (Signed)
Cardiac Individual Treatment Plan  Patient Details  Name: Trevor [name] MRN: [id number] Date of Birth: [birth year] Referring Provider:   Flowsheet Row CARDIAC REHAB PHASE II ORIENTATION from 03/04/2023 in [facility] CARDIAC REHABILITATION  Referring Provider Richardo Priest MD  [facility] Cardiologist Dr.Vishnu Mallipeddi]       Initial Encounter Date:  Flowsheet Row CARDIAC REHAB PHASE II ORIENTATION from 03/04/2023 in [place] CARDIAC REHABILITATION  Date 03/04/23       Visit Diagnosis: NSTEMI (non-ST elevated myocardial infarction) [facility])  Status post coronary artery stent placement  Patient's Home Medications on Admission:  Current Outpatient Medications:    ACCU-CHEK AVIVA PLUS test strip, USE TO TEST BLOOD SUGAR BEFORE BREAKFAST AND AT BEDTIME, Disp: 200 strip, Rfl: 2   Accu-Chek Softclix Lancets lancets, , Disp: , Rfl:    acetaminophen (TYLENOL) 325 MG tablet, Take 1-2 tablets (325-650 mg total) by mouth every 6 (six) hours as needed for mild pain (pain score 1-3 or temp > 100.5)., Disp: , Rfl:    albuterol (VENTOLIN HFA) 108 (90 Base) MCG/ACT inhaler, Inhale 2 puffs every 4 hours by inhalation route., Disp: , Rfl:    Alcohol Swabs (B-D SINGLE USE SWABS REGULAR) PADS, , Disp: , Rfl:    aspirin EC 81 MG tablet, Take 1 tablet (81 mg total) by mouth daily with breakfast. Swallow whole., Disp: 30 tablet, Rfl: 12   calcitRIOL (ROCALTROL) 0.25 MCG capsule, Take 5 capsules (1.25 mcg total) by mouth every Monday, Wednesday, and Friday with hemodialysis., Disp: 60 capsule, Rfl: 0   carvedilol (COREG) 12.5 MG tablet, Take 12.5 mg by mouth 2 (two) times daily with a meal., Disp: , Rfl:    Cholecalciferol (VITAMIN D-3 PO), Take 1 tablet by mouth daily., Disp: , Rfl:    CIPRO 250 MG tablet, Take 1 tablet every day by oral route for 7 days., Disp: , Rfl:    CRANBERRY EXTRACT PO, Take 1 tablet by mouth daily., Disp: , Rfl:    hydrALAZINE (APRESOLINE) 50 MG tablet, Take 50 mg by mouth 2  (two) times daily., Disp: , Rfl:    isosorbide mononitrate (IMDUR) 30 MG 24 hr tablet, Take 1 tablet (30 mg total) by mouth daily., Disp: 30 tablet, Rfl: 2   lactulose (CHRONULAC) 10 GM/15ML solution, Take 20 g by mouth daily as needed for moderate constipation., Disp: , Rfl:    nitroGLYCERIN (NITROSTAT) 0.4 MG SL tablet, Place 1 tablet (0.4 mg total) under the tongue every 5 (five) minutes as needed for chest pain., Disp: 25 tablet, Rfl: 3   ondansetron (ZOFRAN) 8 MG tablet, , Disp: , Rfl:    rosuvastatin (CRESTOR) 10 MG tablet, Take 10 mg by mouth every evening., Disp: , Rfl:    ticagrelor (BRILINTA) 90 MG TABS tablet, Take 1 tablet (90 mg total) by mouth 2 (two) times daily., Disp: 60 tablet, Rfl: 11   insulin degludec (TRESIBA FLEXTOUCH) 200 UNIT/ML FlexTouch Pen, 10-24 Units as needed (blood sugar). Sliding scale Under 100 Do Not Take (Patient not taking: Reported on 03/04/2023), Disp: , Rfl:   Past Medical History: Past Medical History:  Diagnosis Date   Anemia    Chronic kidney disease    Stage 4?    Diabetes mellitus without complication (HCC)    Hepatitis    not sure what type - over 20 years ago   History of kidney stones    Hyperlipidemia    Hypertension     Tobacco Use: Social History   Tobacco Use  Smoking Status Never  Smokeless Tobacco Never    Labs: Review Flowsheet  More data exists      Latest Ref Rng & Units 12/17/2020 07/22/2022 07/23/2022 02/01/2023 02/03/2023  Labs for ITP Cardiac and Pulmonary Rehab  Cholestrol 0 - 200 mg/dL - - - - 161   LDL (calc) 0 - 99 mg/dL - - - - 42   HDL-C >09 mg/dL - - - - 47   Trlycerides <150 mg/dL - - - - 55   Hemoglobin A1c 4.8 - 5.6 % - 5.7  - 5.3  -  TCO2 22 - 32 mmol/L 27  - 28  - -    Details            Capillary Blood Glucose: Lab Results  Component Value Date   GLUCAP 174 (H) 02/03/2023   GLUCAP 158 (H) 02/03/2023   GLUCAP 164 (H) 02/03/2023   GLUCAP 187 (H) 02/03/2023   GLUCAP 192 (H) 02/02/2023      Exercise Target Goals: Exercise Program Goal: Individual exercise prescription set using results from initial 6 min walk test and THRR while considering  patient's activity barriers and safety.   Exercise Prescription Goal: Starting with aerobic activity 30 plus minutes a day, 3 days per week for initial exercise prescription. Provide home exercise prescription and guidelines that participant acknowledges understanding prior to discharge.  Activity Barriers & Risk Stratification:  Activity Barriers & Cardiac Risk Stratification - 03/04/23 1344       Activity Barriers & Cardiac Risk Stratification   Activity Barriers Left Hip Replacement;Right Hip Replacement;History of Falls;Deconditioning;Balance Concerns;Muscular Weakness;Shortness of Breath    Cardiac Risk Stratification High             6 Minute Walk:  6 Minute Walk     Row Name 03/04/23 1554         6 Minute Walk   Phase Initial     Distance 880 feet     Walk Time 6 minutes     # of Rest Breaks 0     MPH 1.66     METS 1.78     RPE 11     Perceived Dyspnea  1     VO2 Peak 6.24     Symptoms Yes (comment)     Comments release in chest tightness, slightly SOB     Resting HR 78 bpm     Resting BP 116/62     Resting Oxygen Saturation  98 %     Exercise Oxygen Saturation  during 6 min walk 95 %     Max Ex. HR 92 bpm     Max Ex. BP 172/84     2 Minute Post BP 142/74              Oxygen Initial Assessment:   Oxygen Re-Evaluation:   Oxygen Discharge (Final Oxygen Re-Evaluation):   Initial Exercise Prescription:  Initial Exercise Prescription - 03/04/23 1500       Date of Initial Exercise RX and Referring Provider   Date 03/04/23    Referring Provider Richardo Priest MD   Primary Cardiologist Dr.Vishnu Mallipeddi     Oxygen   Maintain Oxygen Saturation 88% or higher      Treadmill   MPH 1.2    Grade 0.5    Minutes 15    METs 2      NuStep   Level 2    SPM 80    Minutes 15    METs 2  Prescription Details   Frequency (times per week) 2    Duration Progress to 30 minutes of continuous aerobic without signs/symptoms of physical distress      Intensity   THRR 40-80% of Max Heartrate 102-127    Ratings of Perceived Exertion 11-13    Perceived Dyspnea 0-4      Progression   Progression Continue to progress workloads to maintain intensity without signs/symptoms of physical distress.      Resistance Training   Training Prescription Yes    Weight 3 lb    Reps 10-15             Perform Capillary Blood Glucose checks as needed.  Exercise Prescription Changes:   Exercise Prescription Changes     Row Name 03/04/23 1500             Response to Exercise   Blood Pressure (Admit) 116/62       Blood Pressure (Exercise) 172/84       Blood Pressure (Exit) 142/74       Heart Rate (Admit) 78 bpm       Heart Rate (Exercise) 92 bpm       Heart Rate (Exit) 74 bpm       Oxygen Saturation (Admit) 98 %       Oxygen Saturation (Exercise) 95 %       Rating of Perceived Exertion (Exercise) 11       Perceived Dyspnea (Exercise) 1       Symptoms chest tightness released, Slightly SOB       Comments walk test results                Exercise Comments:   Exercise Goals and Review:   Exercise Goals     Row Name 03/04/23 1602             Exercise Goals   Increase Physical Activity Yes       Intervention Provide advice, education, support and counseling about physical activity/exercise needs.;Develop an individualized exercise prescription for aerobic and resistive training based on initial evaluation findings, risk stratification, comorbidities and participant's personal goals.       Expected Outcomes Short Term: Attend rehab on a regular basis to increase amount of physical activity.;Long Term: Add in home exercise to make exercise part of routine and to increase amount of physical activity.;Long Term: Exercising regularly at least 3-5 days a week.        Increase Strength and Stamina Yes       Intervention Provide advice, education, support and counseling about physical activity/exercise needs.;Develop an individualized exercise prescription for aerobic and resistive training based on initial evaluation findings, risk stratification, comorbidities and participant's personal goals.       Expected Outcomes Short Term: Increase workloads from initial exercise prescription for resistance, speed, and METs.;Short Term: Perform resistance training exercises routinely during rehab and add in resistance training at home;Long Term: Improve cardiorespiratory fitness, muscular endurance and strength as measured by increased METs and functional capacity ( )       Able to understand and use rate of perceived exertion (RPE) scale Yes       Intervention Provide education and explanation on how to use RPE scale       Expected Outcomes Short Term: Able to use RPE daily in rehab to express subjective intensity level;Long Term:  Able to use RPE to guide intensity level when exercising independently       Able to understand and use Dyspnea  scale Yes       Intervention Provide education and explanation on how to use Dyspnea scale       Expected Outcomes Short Term: Able to use Dyspnea scale daily in rehab to express subjective sense of shortness of breath during exertion;Long Term: Able to use Dyspnea scale to guide intensity level when exercising independently       Knowledge and understanding of Target Heart Rate Range (THRR) Yes       Intervention Provide education and explanation of THRR including how the numbers were predicted and where they are located for reference       Expected Outcomes Short Term: Able to state/look up THRR;Long Term: Able to use THRR to govern intensity when exercising independently;Short Term: Able to use daily as guideline for intensity in rehab       Able to check pulse independently Yes       Intervention Provide education and demonstration  on how to check pulse in carotid and radial arteries.;Review the importance of being able to check your own pulse for safety during independent exercise       Expected Outcomes Short Term: Able to explain why pulse checking is important during independent exercise;Long Term: Able to check pulse independently and accurately       Understanding of Exercise Prescription Yes       Intervention Provide education, explanation, and written materials on patient's individual exercise prescription       Expected Outcomes Short Term: Able to explain program exercise prescription;Long Term: Able to explain home exercise prescription to exercise independently                Exercise Goals Re-Evaluation :    Discharge Exercise Prescription (Final Exercise Prescription Changes):  Exercise Prescription Changes - 03/04/23 1500       Response to Exercise   Blood Pressure (Admit) 116/62    Blood Pressure (Exercise) 172/84    Blood Pressure (Exit) 142/74    Heart Rate (Admit) 78 bpm    Heart Rate (Exercise) 92 bpm    Heart Rate (Exit) 74 bpm    Oxygen Saturation (Admit) 98 %    Oxygen Saturation (Exercise) 95 %    Rating of Perceived Exertion (Exercise) 11    Perceived Dyspnea (Exercise) 1    Symptoms chest tightness released, Slightly SOB    Comments walk test results             Nutrition:  Target Goals: Understanding of nutrition guidelines, daily intake of sodium 1500mg , cholesterol 200mg , calories 30% from fat and 7% or less from saturated fats, daily to have 5 or more servings of fruits and vegetables.  Biometrics:  Pre Biometrics - 03/04/23 1602       Pre Biometrics   Height 5\' 10"  (1.778 m)    Weight 88.9 kg    Waist Circumference 37 inches    Hip Circumference 41 inches    Waist to Hip Ratio 0.9 %    BMI (Calculated) 28.11    Grip Strength 18.6 kg    Single Leg Stand 4.9 seconds              Nutrition Therapy Plan and Nutrition Goals:  Nutrition Therapy & Goals  - 03/04/23 1611       Intervention Plan   Intervention Prescribe, educate and counsel regarding individualized specific dietary modifications aiming towards targeted core components such as weight, hypertension, lipid management, diabetes, heart failure and other comorbidities.;Nutrition handout(s) given to patient.  Expected Outcomes Short Term Goal: Understand basic principles of dietary content, such as calories, fat, sodium, cholesterol and nutrients.;Long Term Goal: Adherence to prescribed nutrition plan.             Nutrition Assessments:  MEDIFICTS Score Key: >=70 Need to make dietary changes  40-70 Heart Healthy Diet <= 40 Therapeutic Level Cholesterol Diet  Flowsheet Row CARDIAC REHAB PHASE II ORIENTATION from 03/04/2023 in [facility] CARDIAC REHABILITATION  Picture Your Plate Total Score on Admission 74      Picture Your Plate Scores: <86 Unhealthy dietary pattern with much room for improvement. 41-50 Dietary pattern unlikely to meet recommendations for good health and room for improvement. 51-60 More healthful dietary pattern, with some room for improvement.  >60 Healthy dietary pattern, although there may be some specific behaviors that could be improved.    Nutrition Goals Re-Evaluation:   Nutrition Goals Discharge (Final Nutrition Goals Re-Evaluation):   Psychosocial: Target Goals: Acknowledge presence or absence of significant depression and/or stress, maximize coping skills, provide positive support system. Participant is able to verbalize types and ability to use techniques and skills needed for reducing stress and depression.  Initial Review & Psychosocial Screening:  Initial Psych Review & Screening - 03/04/23 1355       Initial Review   Current issues with Current Stress Concerns;Current Sleep Concerns    Source of Stress Concerns Unable to participate in former interests or hobbies;Unable to perform yard/household activities;Chronic Illness;Family     Comments Patient's wife lung cancer survivor, and son two strokes. Not sleeping well recently (tried melatonin to help) from feeling like he is SOB when he lays down      Family Dynamics   Good Support System? Yes   wife, sister     Barriers   Psychosocial barriers to participate in program The patient should benefit from training in stress management and relaxation.;Psychosocial barriers identified (see note)      Screening Interventions   Interventions Encouraged to exercise;To provide support and resources with identified psychosocial needs;Provide feedback about the scores to participant    Expected Outcomes Short Term goal: Utilizing psychosocial counselor, staff and physician to assist with identification of specific Stressors or current issues interfering with healing process. Setting desired goal for each stressor or current issue identified.;Long Term Goal: Stressors or current issues are controlled or eliminated.;Short Term goal: Identification and review with participant of any Quality of Life or Depression concerns found by scoring the questionnaire.;Long Term goal: The participant improves quality of Life and PHQ9 Scores as seen by post scores and/or verbalization of changes             Quality of Life Scores:  Quality of Life - 03/04/23 1611       Quality of Life   Select Quality of Life      Quality of Life Scores   Health/Function Pre 24 %    Socioeconomic Pre 24.86 %    Psych/Spiritual Pre 26.57 %    Family Pre 25.2 %    GLOBAL Pre 24.88 %            Scores of 19 and below usually indicate a poorer quality of life in these areas.  A difference of  2-3 points is a clinically meaningful difference.  A difference of 2-3 points in the total score of the Quality of Life Index has been associated with significant improvement in overall quality of life, self-image, physical symptoms, and general health in studies assessing change in  quality of  life.  PHQ-9: Review Flowsheet       03/04/2023 02/11/2023  Depression screen PHQ 2/9  Decreased Interest 0 0  Down, Depressed, Hopeless 1 1  PHQ - 2 Score 1 1  Altered sleeping 3 -  Tired, decreased energy 0 -  Change in appetite 0 -  Feeling bad or failure about yourself  1 -  Trouble concentrating 1 -  Moving slowly or fidgety/restless 1 -  Suicidal thoughts 0 -  PHQ-9 Score 7 -  Difficult doing work/chores Very difficult -    Details           Interpretation of Total Score  Total Score Depression Severity:  1-4 = Minimal depression, 5-9 = Mild depression, 10-14 = Moderate depression, 15-19 = Moderately severe depression, 20-27 = Severe depression   Psychosocial Evaluation and Intervention:  Psychosocial Evaluation - 03/04/23 1607       Psychosocial Evaluation & Interventions   Interventions Stress management education;Encouraged to exercise with the program and follow exercise prescription    Comments Zaydon is coming into cardiac rehab after a NSTEMI and second stent.  He was not able to start last month due to the MI.  Since last review he is having a hard time sleeping as he feels short of breath any time he lays down.  He is still on dialysis MWF and will attend program on TTh.  He is looking forwad to program and hopes it will help with his breathing and getting back his stamina.  He wants to improve his heart health and build up his strength and stamina. He now covered at 100%.    Expected Outcomes Short term: Start and attend the program consistently. Long Term: Build up stamina to be able to go and do again    Continue Psychosocial Services  Follow up required by staff             Psychosocial Re-Evaluation:   Psychosocial Discharge (Final Psychosocial Re-Evaluation):   Vocational Rehabilitation: Provide vocational rehab assistance to qualifying candidates.   Vocational Rehab Evaluation & Intervention:  Vocational Rehab - 03/04/23 1358        Initial Vocational Rehab Evaluation & Intervention   Assessment shows need for Vocational Rehabilitation No   retired            Education: Education Goals: Education classes will be provided on a weekly basis, covering required topics. Participant will state understanding/return demonstration of topics presented.  Learning Barriers/Preferences:  Learning Barriers/Preferences - 03/04/23 1357       Learning Barriers/Preferences   Learning Barriers Sight   glasses for reading   Learning Preferences Written Material             Education Topics: Hypertension, Hypertension Reduction -Define heart disease and high blood pressure. Discus how high blood pressure affects the body and ways to reduce high blood pressure.   Exercise and Your Heart -Discuss why it is important to exercise, the FITT principles of exercise, normal and abnormal responses to exercise, and how to exercise safely.   Angina -Discuss definition of angina, causes of angina, treatment of angina, and how to decrease risk of having angina.   Cardiac Medications -Review what the following cardiac medications are used for, how they affect the body, and side effects that may occur when taking the medications.  Medications include Aspirin, Beta blockers, calcium channel blockers, ACE Inhibitors, angiotensin receptor blockers, diuretics, digoxin, and antihyperlipidemics.   Congestive Heart Failure -Discuss the definition of  CHF, how to live with CHF, the signs and symptoms of CHF, and how keep track of weight and sodium intake.   Heart Disease and Intimacy -Discus the effect sexual activity has on the heart, how changes occur during intimacy as we age, and safety during sexual activity.   Smoking Cessation / COPD -Discuss different methods to quit smoking, the health benefits of quitting smoking, and the definition of COPD.   Nutrition I: Fats -Discuss the types of cholesterol, what cholesterol does to the  heart, and how cholesterol levels can be controlled.   Nutrition II: Labels -Discuss the different components of food labels and how to read food label   Heart Parts/Heart Disease and PAD -Discuss the anatomy of the heart, the pathway of blood circulation through the heart, and these are affected by heart disease.   Stress I: Signs and Symptoms -Discuss the causes of stress, how stress may lead to anxiety and depression, and ways to limit stress.   Stress II: Relaxation -Discuss different types of relaxation techniques to limit stress.   Warning Signs of Stroke / TIA -Discuss definition of a stroke, what the signs and symptoms are of a stroke, and how to identify when someone is having stroke.   Knowledge Questionnaire Score:  Knowledge Questionnaire Score - 03/04/23 1615       Knowledge Questionnaire Score   Pre Score 15/24             Core Components/Risk Factors/Patient Goals at Admission:  Personal Goals and Risk Factors at Admission - 03/04/23 1615       Core Components/Risk Factors/Patient Goals on Admission    Weight Management Yes;Weight Loss    Intervention Weight Management: Develop a combined nutrition and exercise program designed to reach desired caloric intake, while maintaining appropriate intake of nutrient and fiber, sodium and fats, and appropriate energy expenditure required for the weight goal.;Weight Management: Provide education and appropriate resources to help participant work on and attain dietary goals.;Weight Management/Obesity: Establish reasonable short term and long term weight goals.    Admit Weight 195 lb 14.4 oz (88.9 kg)    Goal Weight: Short Term 190 lb (86.2 kg)    Goal Weight: Long Term 190 lb (86.2 kg)    Expected Outcomes Long Term: Adherence to nutrition and physical activity/exercise program aimed toward attainment of established weight goal;Short Term: Continue to assess and modify interventions until short term weight is  achieved;Weight Maintenance: Understanding of the daily nutrition guidelines, which includes 25-35% calories from fat, 7% or less cal from saturated fats, less than 200mg  cholesterol, less than 1.5gm of sodium, & 5 or more servings of fruits and vegetables daily;Weight Loss: Understanding of general recommendations for a balanced deficit meal plan, which promotes 1-2 lb weight loss per week and includes a negative energy balance of [phone] kcal/d;Understanding recommendations for meals to include 15-35% energy as protein, 25-35% energy from fat, 35-60% energy from carbohydrates, less than 200mg  of dietary cholesterol, 20-35 gm of total fiber daily;Understanding of distribution of calorie intake throughout the day with the consumption of 4-5 meals/snacks    Improve shortness of breath with ADL's Yes    Intervention Provide education, individualized exercise plan and daily activity instruction to help decrease symptoms of SOB with activities of daily living.    Expected Outcomes Short Term: Improve cardiorespiratory fitness to achieve a reduction of symptoms when performing ADLs;Long Term: Be able to perform more ADLs without symptoms or delay the onset of symptoms    Diabetes  Yes   A1c now 5.3   Intervention Provide education about signs/symptoms and action to take for hypo/hyperglycemia.;Provide education about proper nutrition, including hydration, and aerobic/resistive exercise prescription along with prescribed medications to achieve blood glucose in normal ranges: Fasting glucose 65-99 mg/dL    Expected Outcomes Long Term: Attainment of HbA1C < 7%.;Short Term: Participant verbalizes understanding of the signs/symptoms and immediate care of hyper/hypoglycemia, proper foot care and importance of medication, aerobic/resistive exercise and nutrition plan for blood glucose control.    Heart Failure Yes    Intervention Provide a combined exercise and nutrition program that is supplemented with education,  support and counseling about heart failure. Directed toward relieving symptoms such as shortness of breath, decreased exercise tolerance, and extremity edema.    Expected Outcomes Improve functional capacity of life;Short term: Attendance in program 2-3 days a week with increased exercise capacity. Reported lower sodium intake. Reported increased fruit and vegetable intake. Reports medication compliance.;Short term: Daily weights obtained and reported for increase. Utilizing diuretic protocols set by physician.;Long term: Adoption of self-care skills and reduction of barriers for early signs and symptoms recognition and intervention leading to self-care maintenance.    Hypertension Yes    Intervention Provide education on lifestyle modifcations including regular physical activity/exercise, weight management, moderate sodium restriction and increased consumption of fresh fruit, vegetables, and low fat dairy, alcohol moderation, and smoking cessation.;Monitor prescription use compliance.    Expected Outcomes Short Term: Continued assessment and intervention until BP is < 140/54mm HG in hypertensive participants. < 130/51mm HG in hypertensive participants with diabetes, heart failure or chronic kidney disease.;Long Term: Maintenance of blood pressure at goal levels.    Lipids Yes    Intervention Provide education and support for participant on nutrition & aerobic/resistive exercise along with prescribed medications to achieve LDL 70mg , HDL >40mg .    Expected Outcomes Short Term: Participant states understanding of desired cholesterol values and is compliant with medications prescribed. Participant is following exercise prescription and nutrition guidelines.;Long Term: Cholesterol controlled with medications as prescribed, with individualized exercise RX and with personalized nutrition plan. Value goals: LDL < 70mg , HDL > 40 mg.             Core Components/Risk Factors/Patient Goals Review:    Core  Components/Risk Factors/Patient Goals at Discharge (Final Review):    ITP Comments:  ITP Comments     Row Name 03/04/23 1547           ITP Comments Patient attend orientation today.  Patient is attending Cardiac Rehabilitation Program.  Documentation for diagnosis can be found in [facility] 02/01/23.  Reviewed medical chart, RPE/RPD, gym safety, and program guidelines.  Patient was fitted to equipment they will be using during rehab.  Patient is scheduled to start exercise on Tuesday 03/09/23 at 1330.   Initial ITP created and sent for review and signature by Dr. Dina Rich, Medical Director for Cardiac Rehabilitation Program.                Comments: Initial ITP

## 2023-03-05 ENCOUNTER — Other Ambulatory Visit (HOSPITAL_COMMUNITY): Payer: Self-pay | Admitting: Nurse Practitioner

## 2023-03-05 ENCOUNTER — Ambulatory Visit (HOSPITAL_COMMUNITY)
Admission: RE | Admit: 2023-03-05 | Discharge: 2023-03-05 | Disposition: A | Discharge status: Home / Self Care | Payer: Medicare HMO | Source: Ambulatory Visit | Attending: Nurse Practitioner | Admitting: Nurse Practitioner

## 2023-03-05 DIAGNOSIS — Z992 Dependence on renal dialysis: Secondary | ICD-10-CM | POA: Diagnosis not present

## 2023-03-05 DIAGNOSIS — I700 Atherosclerosis of aorta: Secondary | ICD-10-CM | POA: Diagnosis not present

## 2023-03-05 DIAGNOSIS — R0602 Shortness of breath: Secondary | ICD-10-CM

## 2023-03-05 DIAGNOSIS — N186 End stage renal disease: Secondary | ICD-10-CM | POA: Diagnosis not present

## 2023-03-05 DIAGNOSIS — N2581 Secondary hyperparathyroidism of renal origin: Secondary | ICD-10-CM | POA: Diagnosis not present

## 2023-03-08 DIAGNOSIS — Z992 Dependence on renal dialysis: Secondary | ICD-10-CM | POA: Diagnosis not present

## 2023-03-08 DIAGNOSIS — N186 End stage renal disease: Secondary | ICD-10-CM | POA: Diagnosis not present

## 2023-03-08 DIAGNOSIS — N2581 Secondary hyperparathyroidism of renal origin: Secondary | ICD-10-CM | POA: Diagnosis not present

## 2023-03-09 ENCOUNTER — Encounter (HOSPITAL_COMMUNITY)
Admission: RE | Admit: 2023-03-09 | Discharge: 2023-03-09 | Disposition: A | Discharge status: Home / Self Care | Payer: Medicare HMO | Source: Ambulatory Visit | Attending: Internal Medicine | Admitting: Internal Medicine

## 2023-03-09 DIAGNOSIS — E785 Hyperlipidemia, unspecified: Secondary | ICD-10-CM | POA: Diagnosis not present

## 2023-03-09 DIAGNOSIS — E1122 Type 2 diabetes mellitus with diabetic chronic kidney disease: Secondary | ICD-10-CM | POA: Diagnosis not present

## 2023-03-09 DIAGNOSIS — I214 Non-ST elevation (NSTEMI) myocardial infarction: Secondary | ICD-10-CM | POA: Diagnosis not present

## 2023-03-09 DIAGNOSIS — Z955 Presence of coronary angioplasty implant and graft: Secondary | ICD-10-CM | POA: Diagnosis not present

## 2023-03-09 LAB — GLUCOSE, CAPILLARY
Glucose-Capillary: 96 mg/dL (ref 70–99)
Glucose-Capillary: 90 mg/dL (ref 70–99)

## 2023-03-09 NOTE — Progress Notes (Addendum)
Daily Session Note  Patient Details  Name: Trevor [name] MRN: [id number] Date of Birth: 1940/06/27 Referring Provider:   Flowsheet Row CARDIAC REHAB PHASE II ORIENTATION from 03/04/2023 in [facility] CARDIAC REHABILITATION  Referring Provider Richardo Priest MD  [facility] Cardiologist Dr.Vishnu Mallipeddi]       Encounter Date: 03/09/2023  Check In:  Session Check In - 03/09/23 1330       Check-In   Supervising physician immediately available to respond to emergencies See telemetry face sheet for immediately available MD    Location [facility]    Staff Present Ross Ludwig, BS, Exercise Physiologist;Adahlia Stembridge, RN;Jessica [place], MA, RCEP, CCRP, Dow Adolph, RN, BSN    Virtual Visit No    Medication changes reported     No    Fall or balance concerns reported    No    Tobacco Cessation No Change    Warm-up and Cool-down Performed as group-led instruction    Resistance Training Performed Yes    VAD Patient? No    PAD/SET Patient? No      Pain Assessment   Currently in Pain? No/denies    Multiple Pain Sites No             Capillary Blood Glucose: No results found for this or any previous visit (from the past 24 hour(s)).    Social History   Tobacco Use  Smoking Status Never  Smokeless Tobacco Never    Goals Met:  Independence with exercise equipment Exercise tolerated well No report of concerns or symptoms today Strength training completed today  Goals Unmet:  Not Applicable  Comments: First full day of exercise!  Patient was oriented to gym and equipment including functions, settings, policies, and procedures.  Patient's individual exercise prescription and treatment plan were reviewed.  All starting workloads were established based on the results of the 6 minute walk test done at initial orientation visit.  The plan for exercise progression was also introduced and progression will be customized based on patient's  performance and goals.

## 2023-03-10 DIAGNOSIS — N186 End stage renal disease: Secondary | ICD-10-CM | POA: Diagnosis not present

## 2023-03-10 DIAGNOSIS — N2581 Secondary hyperparathyroidism of renal origin: Secondary | ICD-10-CM | POA: Diagnosis not present

## 2023-03-10 DIAGNOSIS — Z992 Dependence on renal dialysis: Secondary | ICD-10-CM | POA: Diagnosis not present

## 2023-03-12 DIAGNOSIS — Z992 Dependence on renal dialysis: Secondary | ICD-10-CM | POA: Diagnosis not present

## 2023-03-12 DIAGNOSIS — N2581 Secondary hyperparathyroidism of renal origin: Secondary | ICD-10-CM | POA: Diagnosis not present

## 2023-03-12 DIAGNOSIS — N186 End stage renal disease: Secondary | ICD-10-CM | POA: Diagnosis not present

## 2023-03-13 DIAGNOSIS — Z992 Dependence on renal dialysis: Secondary | ICD-10-CM | POA: Diagnosis not present

## 2023-03-13 DIAGNOSIS — N186 End stage renal disease: Secondary | ICD-10-CM | POA: Diagnosis not present

## 2023-03-15 DIAGNOSIS — Z992 Dependence on renal dialysis: Secondary | ICD-10-CM | POA: Diagnosis not present

## 2023-03-15 DIAGNOSIS — N186 End stage renal disease: Secondary | ICD-10-CM | POA: Diagnosis not present

## 2023-03-16 ENCOUNTER — Encounter: Payer: Self-pay | Admitting: Internal Medicine

## 2023-03-16 ENCOUNTER — Ambulatory Visit (HOSPITAL_COMMUNITY): Admission: RE | Admit: 2023-03-16 | Payer: Medicare HMO | Source: Ambulatory Visit

## 2023-03-16 ENCOUNTER — Encounter (HOSPITAL_COMMUNITY)
Admission: RE | Admit: 2023-03-16 | Discharge: 2023-03-16 | Disposition: A | Discharge status: Home / Self Care | Payer: Medicare HMO | Source: Ambulatory Visit | Attending: Internal Medicine | Admitting: Internal Medicine

## 2023-03-16 DIAGNOSIS — I132 Hypertensive heart and chronic kidney disease with heart failure and with stage 5 chronic kidney disease, or end stage renal disease: Secondary | ICD-10-CM | POA: Diagnosis not present

## 2023-03-16 DIAGNOSIS — N186 End stage renal disease: Secondary | ICD-10-CM | POA: Diagnosis not present

## 2023-03-16 DIAGNOSIS — I214 Non-ST elevation (NSTEMI) myocardial infarction: Secondary | ICD-10-CM | POA: Diagnosis not present

## 2023-03-16 DIAGNOSIS — D631 Anemia in chronic kidney disease: Secondary | ICD-10-CM | POA: Diagnosis not present

## 2023-03-16 DIAGNOSIS — E785 Hyperlipidemia, unspecified: Secondary | ICD-10-CM | POA: Diagnosis not present

## 2023-03-16 DIAGNOSIS — Z992 Dependence on renal dialysis: Secondary | ICD-10-CM | POA: Diagnosis not present

## 2023-03-16 DIAGNOSIS — Z955 Presence of coronary angioplasty implant and graft: Secondary | ICD-10-CM | POA: Insufficient documentation

## 2023-03-16 DIAGNOSIS — I129 Hypertensive chronic kidney disease with stage 1 through stage 4 chronic kidney disease, or unspecified chronic kidney disease: Secondary | ICD-10-CM | POA: Diagnosis not present

## 2023-03-16 DIAGNOSIS — I440 Atrioventricular block, first degree: Secondary | ICD-10-CM | POA: Diagnosis not present

## 2023-03-16 DIAGNOSIS — I5032 Chronic diastolic (congestive) heart failure: Secondary | ICD-10-CM | POA: Diagnosis not present

## 2023-03-16 DIAGNOSIS — I25110 Atherosclerotic heart disease of native coronary artery with unstable angina pectoris: Secondary | ICD-10-CM | POA: Diagnosis not present

## 2023-03-16 NOTE — Progress Notes (Signed)
Daily Session Note  Patient Details  Name: Trevor [name] MRN: [id number] Date of Birth: 04/15/1940 Referring Provider:   Flowsheet Row CARDIAC REHAB PHASE II ORIENTATION from 03/04/2023 in [facility] CARDIAC REHABILITATION  Referring Provider Richardo Priest MD  [facility] Cardiologist Dr.Vishnu Mallipeddi]       Encounter Date: 03/16/2023  Check In:  Session Check In - 03/16/23 1330       Check-In   Supervising physician immediately available to respond to emergencies See telemetry face sheet for immediately available MD    Location [facility]    Staff Present Ross Ludwig, BS, Exercise Physiologist;Phyllis Billingsley, Sammie Bench, RN, BSN    Virtual Visit No    Medication changes reported     No    Fall or balance concerns reported    No    Tobacco Cessation No Change    Warm-up and Cool-down Performed on first and last piece of equipment    Resistance Training Performed Yes    VAD Patient? No    PAD/SET Patient? No      Pain Assessment   Currently in Pain? No/denies    Multiple Pain Sites No             Capillary Blood Glucose: No results found for this or any previous visit (from the past 24 hour(s)).    Social History   Tobacco Use  Smoking Status Never  Smokeless Tobacco Never    Goals Met:  Independence with exercise equipment Exercise tolerated well No report of concerns or symptoms today Strength training completed today  Goals Unmet:  Not Applicable  Comments: Pt able to follow exercise prescription today without complaint.  Will continue to monitor for progression.

## 2023-03-17 ENCOUNTER — Encounter (HOSPITAL_COMMUNITY): Payer: Self-pay | Admitting: *Deleted

## 2023-03-17 DIAGNOSIS — I214 Non-ST elevation (NSTEMI) myocardial infarction: Secondary | ICD-10-CM

## 2023-03-17 DIAGNOSIS — Z955 Presence of coronary angioplasty implant and graft: Secondary | ICD-10-CM

## 2023-03-17 DIAGNOSIS — Z992 Dependence on renal dialysis: Secondary | ICD-10-CM | POA: Diagnosis not present

## 2023-03-17 DIAGNOSIS — N186 End stage renal disease: Secondary | ICD-10-CM | POA: Diagnosis not present

## 2023-03-17 NOTE — Progress Notes (Signed)
Cardiac Individual Treatment Plan  Patient Details  Name: Trevor [name] MRN: [id number] Date of Birth: 1940-05-09 Referring Provider:   Flowsheet Row CARDIAC REHAB PHASE II ORIENTATION from 03/04/2023 in [facility] CARDIAC REHABILITATION  Referring Provider Richardo Priest MD  [facility] Cardiologist Dr.Vishnu Mallipeddi]       Initial Encounter Date:  Flowsheet Row CARDIAC REHAB PHASE II ORIENTATION from 03/04/2023 in [place] CARDIAC REHABILITATION  Date 03/04/23       Visit Diagnosis: NSTEMI (non-ST elevated myocardial infarction) [facility])  Status post coronary artery stent placement  Patient's Home Medications on Admission:  Current Outpatient Medications:    ACCU-CHEK AVIVA PLUS test strip, USE TO TEST BLOOD SUGAR BEFORE BREAKFAST AND AT BEDTIME, Disp: 200 strip, Rfl: 2   Accu-Chek Softclix Lancets lancets, , Disp: , Rfl:    acetaminophen (TYLENOL) 325 MG tablet, Take 1-2 tablets (325-650 mg total) by mouth every 6 (six) hours as needed for mild pain (pain score 1-3 or temp > 100.5)., Disp: , Rfl:    albuterol (VENTOLIN HFA) 108 (90 Base) MCG/ACT inhaler, Inhale 2 puffs every 4 hours by inhalation route., Disp: , Rfl:    Alcohol Swabs (B-D SINGLE USE SWABS REGULAR) PADS, , Disp: , Rfl:    aspirin EC 81 MG tablet, Take 1 tablet (81 mg total) by mouth daily with breakfast. Swallow whole., Disp: 30 tablet, Rfl: 12   calcitRIOL (ROCALTROL) 0.25 MCG capsule, Take 5 capsules (1.25 mcg total) by mouth every Monday, Wednesday, and Friday with hemodialysis., Disp: 60 capsule, Rfl: 0   carvedilol (COREG) 12.5 MG tablet, Take 12.5 mg by mouth 2 (two) times daily with a meal., Disp: , Rfl:    Cholecalciferol (VITAMIN D-3 PO), Take 1 tablet by mouth daily., Disp: , Rfl:    CIPRO 250 MG tablet, Take 1 tablet every day by oral route for 7 days., Disp: , Rfl:    CRANBERRY EXTRACT PO, Take 1 tablet by mouth daily., Disp: , Rfl:    hydrALAZINE (APRESOLINE) 50 MG tablet, Take 50 mg by mouth 2  (two) times daily., Disp: , Rfl:    insulin degludec (TRESIBA FLEXTOUCH) 200 UNIT/ML FlexTouch Pen, 10-24 Units as needed (blood sugar). Sliding scale Under 100 Do Not Take (Patient not taking: Reported on 03/04/2023), Disp: , Rfl:    isosorbide mononitrate (IMDUR) 30 MG 24 hr tablet, Take 1 tablet (30 mg total) by mouth daily., Disp: 30 tablet, Rfl: 2   lactulose (CHRONULAC) 10 GM/15ML solution, Take 20 g by mouth daily as needed for moderate constipation., Disp: , Rfl:    nitroGLYCERIN (NITROSTAT) 0.4 MG SL tablet, Place 1 tablet (0.4 mg total) under the tongue every 5 (five) minutes as needed for chest pain., Disp: 25 tablet, Rfl: 3   ondansetron (ZOFRAN) 8 MG tablet, , Disp: , Rfl:    rosuvastatin (CRESTOR) 10 MG tablet, Take 10 mg by mouth every evening., Disp: , Rfl:    ticagrelor (BRILINTA) 90 MG TABS tablet, Take 1 tablet (90 mg total) by mouth 2 (two) times daily., Disp: 60 tablet, Rfl: 11  Past Medical History: Past Medical History:  Diagnosis Date   Anemia    Chronic kidney disease    Stage 4?    Diabetes mellitus without complication (HCC)    Hepatitis    not sure what type - over 20 years ago   History of kidney stones    Hyperlipidemia    Hypertension     Tobacco Use: Social History   Tobacco Use  Smoking Status Never  Smokeless Tobacco Never    Labs: Review Flowsheet  More data exists      Latest Ref Rng & Units 12/17/2020 07/22/2022 07/23/2022 02/01/2023 02/03/2023  Labs for ITP Cardiac and Pulmonary Rehab  Cholestrol 0 - 200 mg/dL - - - - 161   LDL (calc) 0 - 99 mg/dL - - - - 42   HDL-C >09 mg/dL - - - - 47   Trlycerides <150 mg/dL - - - - 55   Hemoglobin A1c 4.8 - 5.6 % - 5.7  - 5.3  -  TCO2 22 - 32 mmol/L 27  - 28  - -    Details            Capillary Blood Glucose: Lab Results  Component Value Date   GLUCAP 90 03/09/2023   GLUCAP 96 03/09/2023   GLUCAP 174 (H) 02/03/2023   GLUCAP 158 (H) 02/03/2023   GLUCAP 164 (H) 02/03/2023     Exercise  Target Goals: Exercise Program Goal: Individual exercise prescription set using results from initial 6 min walk test and THRR while considering  patient's activity barriers and safety.   Exercise Prescription Goal: Starting with aerobic activity 30 plus minutes a day, 3 days per week for initial exercise prescription. Provide home exercise prescription and guidelines that participant acknowledges understanding prior to discharge.  Activity Barriers & Risk Stratification:  Activity Barriers & Cardiac Risk Stratification - 03/04/23 1344       Activity Barriers & Cardiac Risk Stratification   Activity Barriers Left Hip Replacement;Right Hip Replacement;History of Falls;Deconditioning;Balance Concerns;Muscular Weakness;Shortness of Breath    Cardiac Risk Stratification High             6 Minute Walk:  6 Minute Walk     Row Name 03/04/23 1554         6 Minute Walk   Phase Initial     Distance 880 feet     Walk Time 6 minutes     # of Rest Breaks 0     MPH 1.66     METS 1.78     RPE 11     Perceived Dyspnea  1     VO2 Peak 6.24     Symptoms Yes (comment)     Comments release in chest tightness, slightly SOB     Resting HR 78 bpm     Resting BP 116/62     Resting Oxygen Saturation  98 %     Exercise Oxygen Saturation  during 6 min walk 95 %     Max Ex. HR 92 bpm     Max Ex. BP 172/84     2 Minute Post BP 142/74              Oxygen Initial Assessment:   Oxygen Re-Evaluation:   Oxygen Discharge (Final Oxygen Re-Evaluation):   Initial Exercise Prescription:  Initial Exercise Prescription - 03/04/23 1500       Date of Initial Exercise RX and Referring Provider   Date 03/04/23    Referring Provider Richardo Priest MD   Primary Cardiologist Dr.Vishnu Mallipeddi     Oxygen   Maintain Oxygen Saturation 88% or higher      Treadmill   MPH 1.2    Grade 0.5    Minutes 15    METs 2      NuStep   Level 2    SPM 80    Minutes 15    METs 2  Prescription Details   Frequency (times per week) 2    Duration Progress to 30 minutes of continuous aerobic without signs/symptoms of physical distress      Intensity   THRR 40-80% of Max Heartrate 102-127    Ratings of Perceived Exertion 11-13    Perceived Dyspnea 0-4      Progression   Progression Continue to progress workloads to maintain intensity without signs/symptoms of physical distress.      Resistance Training   Training Prescription Yes    Weight 3 lb    Reps 10-15             Perform Capillary Blood Glucose checks as needed.  Exercise Prescription Changes:   Exercise Prescription Changes     Row Name 03/04/23 1500 03/09/23 1500           Response to Exercise   Blood Pressure (Admit) 116/62 122/52      Blood Pressure (Exercise) 172/84 132/62      Blood Pressure (Exit) 142/74 120/60      Heart Rate (Admit) 78 bpm 57 bpm      Heart Rate (Exercise) 92 bpm 93 bpm      Heart Rate (Exit) 74 bpm 75 bpm      Oxygen Saturation (Admit) 98 % --      Oxygen Saturation (Exercise) 95 % --      Rating of Perceived Exertion (Exercise) 11 12      Perceived Dyspnea (Exercise) 1 --      Symptoms chest tightness released, Slightly SOB --      Comments walk test results --      Duration -- Continue with 30 min of aerobic exercise without signs/symptoms of physical distress.      Intensity -- THRR unchanged        Progression   Progression -- Continue to progress workloads to maintain intensity without signs/symptoms of physical distress.        Resistance Training   Training Prescription -- Yes      Weight -- 3 lbs      Reps -- 10-15        Oxygen   Oxygen -- Continuous        Treadmill   MPH -- 1.2      Grade -- 0.5      Minutes -- 15      METs -- 2        NuStep   Level -- 1      SPM -- 60      Minutes -- 15      METs -- 1.5        Track   Laps -- 20        Oxygen   Maintain Oxygen Saturation -- 88% or higher               Exercise  Comments:   Exercise Goals and Review:   Exercise Goals     Row Name 03/04/23 1602             Exercise Goals   Increase Physical Activity Yes       Intervention Provide advice, education, support and counseling about physical activity/exercise needs.;Develop an individualized exercise prescription for aerobic and resistive training based on initial evaluation findings, risk stratification, comorbidities and participant's personal goals.       Expected Outcomes Short Term: Attend rehab on a regular basis to increase amount of physical activity.;Long Term: Add in home exercise to make  exercise part of routine and to increase amount of physical activity.;Long Term: Exercising regularly at least 3-5 days a week.       Increase Strength and Stamina Yes       Intervention Provide advice, education, support and counseling about physical activity/exercise needs.;Develop an individualized exercise prescription for aerobic and resistive training based on initial evaluation findings, risk stratification, comorbidities and participant's personal goals.       Expected Outcomes Short Term: Increase workloads from initial exercise prescription for resistance, speed, and METs.;Short Term: Perform resistance training exercises routinely during rehab and add in resistance training at home;Long Term: Improve cardiorespiratory fitness, muscular endurance and strength as measured by increased METs and functional capacity ( )       Able to understand and use rate of perceived exertion (RPE) scale Yes       Intervention Provide education and explanation on how to use RPE scale       Expected Outcomes Short Term: Able to use RPE daily in rehab to express subjective intensity level;Long Term:  Able to use RPE to guide intensity level when exercising independently       Able to understand and use Dyspnea scale Yes       Intervention Provide education and explanation on how to use Dyspnea scale       Expected  Outcomes Short Term: Able to use Dyspnea scale daily in rehab to express subjective sense of shortness of breath during exertion;Long Term: Able to use Dyspnea scale to guide intensity level when exercising independently       Knowledge and understanding of Target Heart Rate Range (THRR) Yes       Intervention Provide education and explanation of THRR including how the numbers were predicted and where they are located for reference       Expected Outcomes Short Term: Able to state/look up THRR;Long Term: Able to use THRR to govern intensity when exercising independently;Short Term: Able to use daily as guideline for intensity in rehab       Able to check pulse independently Yes       Intervention Provide education and demonstration on how to check pulse in carotid and radial arteries.;Review the importance of being able to check your own pulse for safety during independent exercise       Expected Outcomes Short Term: Able to explain why pulse checking is important during independent exercise;Long Term: Able to check pulse independently and accurately       Understanding of Exercise Prescription Yes       Intervention Provide education, explanation, and written materials on patient's individual exercise prescription       Expected Outcomes Short Term: Able to explain program exercise prescription;Long Term: Able to explain home exercise prescription to exercise independently                Exercise Goals Re-Evaluation :    Discharge Exercise Prescription (Final Exercise Prescription Changes):  Exercise Prescription Changes - 03/09/23 1500       Response to Exercise   Blood Pressure (Admit) 122/52    Blood Pressure (Exercise) 132/62    Blood Pressure (Exit) 120/60    Heart Rate (Admit) 57 bpm    Heart Rate (Exercise) 93 bpm    Heart Rate (Exit) 75 bpm    Rating of Perceived Exertion (Exercise) 12    Duration Continue with 30 min of aerobic exercise without signs/symptoms of physical  distress.    Intensity THRR unchanged  Progression   Progression Continue to progress workloads to maintain intensity without signs/symptoms of physical distress.      Resistance Training   Training Prescription Yes    Weight 3 lbs    Reps 10-15      Oxygen   Oxygen Continuous      Treadmill   MPH 1.2    Grade 0.5    Minutes 15    METs 2      NuStep   Level 1    SPM 60    Minutes 15    METs 1.5      Track   Laps 20      Oxygen   Maintain Oxygen Saturation 88% or higher             Nutrition:  Target Goals: Understanding of nutrition guidelines, daily intake of sodium 1500mg , cholesterol 200mg , calories 30% from fat and 7% or less from saturated fats, daily to have 5 or more servings of fruits and vegetables.  Biometrics:  Pre Biometrics - 03/04/23 1602       Pre Biometrics   Height 5\' 10"  (1.778 m)    Weight 195 lb 14.4 oz (88.9 kg)    Waist Circumference 37 inches    Hip Circumference 41 inches    Waist to Hip Ratio 0.9 %    BMI (Calculated) 28.11    Grip Strength 18.6 kg    Single Leg Stand 4.9 seconds              Nutrition Therapy Plan and Nutrition Goals:  Nutrition Therapy & Goals - 03/04/23 1611       Intervention Plan   Intervention Prescribe, educate and counsel regarding individualized specific dietary modifications aiming towards targeted core components such as weight, hypertension, lipid management, diabetes, heart failure and other comorbidities.;Nutrition handout(s) given to patient.    Expected Outcomes Short Term Goal: Understand basic principles of dietary content, such as calories, fat, sodium, cholesterol and nutrients.;Long Term Goal: Adherence to prescribed nutrition plan.             Nutrition Assessments:  MEDIFICTS Score Key: >=70 Need to make dietary changes  40-70 Heart Healthy Diet <= 40 Therapeutic Level Cholesterol Diet  Flowsheet Row CARDIAC REHAB PHASE II ORIENTATION from 03/04/2023 in [facility]  CARDIAC REHABILITATION  Picture Your Plate Total Score on Admission 74      Picture Your Plate Scores: <62 Unhealthy dietary pattern with much room for improvement. 41-50 Dietary pattern unlikely to meet recommendations for good health and room for improvement. 51-60 More healthful dietary pattern, with some room for improvement.  >60 Healthy dietary pattern, although there may be some specific behaviors that could be improved.    Nutrition Goals Re-Evaluation:   Nutrition Goals Discharge (Final Nutrition Goals Re-Evaluation):   Psychosocial: Target Goals: Acknowledge presence or absence of significant depression and/or stress, maximize coping skills, provide positive support system. Participant is able to verbalize types and ability to use techniques and skills needed for reducing stress and depression.  Initial Review & Psychosocial Screening:  Initial Psych Review & Screening - 03/04/23 1355       Initial Review   Current issues with Current Stress Concerns;Current Sleep Concerns    Source of Stress Concerns Unable to participate in former interests or hobbies;Unable to perform yard/household activities;Chronic Illness;Family    Comments Patient's wife lung cancer survivor, and son two strokes. Not sleeping well recently (tried melatonin to help) from feeling like he is SOB when  he lays down      Family Dynamics   Good Support System? Yes   wife, sister     Barriers   Psychosocial barriers to participate in program The patient should benefit from training in stress management and relaxation.;Psychosocial barriers identified (see note)      Screening Interventions   Interventions Encouraged to exercise;To provide support and resources with identified psychosocial needs;Provide feedback about the scores to participant    Expected Outcomes Short Term goal: Utilizing psychosocial counselor, staff and physician to assist with identification of specific Stressors or current issues  interfering with healing process. Setting desired goal for each stressor or current issue identified.;Long Term Goal: Stressors or current issues are controlled or eliminated.;Short Term goal: Identification and review with participant of any Quality of Life or Depression concerns found by scoring the questionnaire.;Long Term goal: The participant improves quality of Life and PHQ9 Scores as seen by post scores and/or verbalization of changes             Quality of Life Scores:  Quality of Life - 03/04/23 1611       Quality of Life   Select Quality of Life      Quality of Life Scores   Health/Function Pre 24 %    Socioeconomic Pre 24.86 %    Psych/Spiritual Pre 26.57 %    Family Pre 25.2 %    GLOBAL Pre 24.88 %            Scores of 19 and below usually indicate a poorer quality of life in these areas.  A difference of  2-3 points is a clinically meaningful difference.  A difference of 2-3 points in the total score of the Quality of Life Index has been associated with significant improvement in overall quality of life, self-image, physical symptoms, and general health in studies assessing change in quality of life.  PHQ-9: Review Flowsheet       03/04/2023 02/11/2023  Depression screen PHQ 2/9  Decreased Interest 0 0  Down, Depressed, Hopeless 1 1  PHQ - 2 Score 1 1  Altered sleeping 3 -  Tired, decreased energy 0 -  Change in appetite 0 -  Feeling bad or failure about yourself  1 -  Trouble concentrating 1 -  Moving slowly or fidgety/restless 1 -  Suicidal thoughts 0 -  PHQ-9 Score 7 -  Difficult doing work/chores Very difficult -    Details           Interpretation of Total Score  Total Score Depression Severity:  1-4 = Minimal depression, 5-9 = Mild depression, 10-14 = Moderate depression, 15-19 = Moderately severe depression, 20-27 = Severe depression   Psychosocial Evaluation and Intervention:  Psychosocial Evaluation - 03/04/23 1607       Psychosocial  Evaluation & Interventions   Interventions Stress management education;Encouraged to exercise with the program and follow exercise prescription    Comments Trevor [name] is coming into cardiac rehab after a NSTEMI and second stent.  He was not able to start last month due to the MI.  Since last review he is having a hard time sleeping as he feels short of breath any time he lays down.  He is still on dialysis MWF and will attend program on TTh.  He is looking forwad to program and hopes it will help with his breathing and getting back his stamina.  He wants to improve his heart health and build up his strength and stamina. He now covered  at 100%.    Expected Outcomes Short term: Start and attend the program consistently. Long Term: Build up stamina to be able to go and do again    Continue Psychosocial Services  Follow up required by staff             Psychosocial Re-Evaluation:   Psychosocial Discharge (Final Psychosocial Re-Evaluation):   Vocational Rehabilitation: Provide vocational rehab assistance to qualifying candidates.   Vocational Rehab Evaluation & Intervention:  Vocational Rehab - 03/04/23 1358       Initial Vocational Rehab Evaluation & Intervention   Assessment shows need for Vocational Rehabilitation No   retired            Education: Education Goals: Education classes will be provided on a weekly basis, covering required topics. Participant will state understanding/return demonstration of topics presented.  Learning Barriers/Preferences:  Learning Barriers/Preferences - 03/04/23 1357       Learning Barriers/Preferences   Learning Barriers Sight   glasses for reading   Learning Preferences Written Material             Education Topics: Hypertension, Hypertension Reduction -Define heart disease and high blood pressure. Discus how high blood pressure affects the body and ways to reduce high blood pressure.   Exercise and Your Heart -Discuss why it is  important to exercise, the FITT principles of exercise, normal and abnormal responses to exercise, and how to exercise safely.   Angina -Discuss definition of angina, causes of angina, treatment of angina, and how to decrease risk of having angina.   Cardiac Medications -Review what the following cardiac medications are used for, how they affect the body, and side effects that may occur when taking the medications.  Medications include Aspirin, Beta blockers, calcium channel blockers, ACE Inhibitors, angiotensin receptor blockers, diuretics, digoxin, and antihyperlipidemics.   Congestive Heart Failure -Discuss the definition of CHF, how to live with CHF, the signs and symptoms of CHF, and how keep track of weight and sodium intake.   Heart Disease and Intimacy -Discus the effect sexual activity has on the heart, how changes occur during intimacy as we age, and safety during sexual activity.   Smoking Cessation / COPD -Discuss different methods to quit smoking, the health benefits of quitting smoking, and the definition of COPD.   Nutrition I: Fats -Discuss the types of cholesterol, what cholesterol does to the heart, and how cholesterol levels can be controlled.   Nutrition II: Labels -Discuss the different components of food labels and how to read food label   Heart Parts/Heart Disease and PAD -Discuss the anatomy of the heart, the pathway of blood circulation through the heart, and these are affected by heart disease.   Stress I: Signs and Symptoms -Discuss the causes of stress, how stress may lead to anxiety and depression, and ways to limit stress.   Stress II: Relaxation -Discuss different types of relaxation techniques to limit stress.   Warning Signs of Stroke / TIA -Discuss definition of a stroke, what the signs and symptoms are of a stroke, and how to identify when someone is having stroke.   Knowledge Questionnaire Score:  Knowledge Questionnaire Score -  03/04/23 1615       Knowledge Questionnaire Score   Pre Score 15/24             Core Components/Risk Factors/Patient Goals at Admission:  Personal Goals and Risk Factors at Admission - 03/04/23 1615       Core Components/Risk Factors/Patient Goals on Admission  Weight Management Yes;Weight Loss    Intervention Weight Management: Develop a combined nutrition and exercise program designed to reach desired caloric intake, while maintaining appropriate intake of nutrient and fiber, sodium and fats, and appropriate energy expenditure required for the weight goal.;Weight Management: Provide education and appropriate resources to help participant work on and attain dietary goals.;Weight Management/Obesity: Establish reasonable short term and long term weight goals.    Admit Weight 195 lb 14.4 oz (88.9 kg)    Goal Weight: Short Term 190 lb (86.2 kg)    Goal Weight: Long Term 190 lb (86.2 kg)    Expected Outcomes Long Term: Adherence to nutrition and physical activity/exercise program aimed toward attainment of established weight goal;Short Term: Continue to assess and modify interventions until short term weight is achieved;Weight Maintenance: Understanding of the daily nutrition guidelines, which includes 25-35% calories from fat, 7% or less cal from saturated fats, less than 200mg  cholesterol, less than 1.5gm of sodium, & 5 or more servings of fruits and vegetables daily;Weight Loss: Understanding of general recommendations for a balanced deficit meal plan, which promotes 1-2 lb weight loss per week and includes a negative energy balance of [phone] kcal/d;Understanding recommendations for meals to include 15-35% energy as protein, 25-35% energy from fat, 35-60% energy from carbohydrates, less than 200mg  of dietary cholesterol, 20-35 gm of total fiber daily;Understanding of distribution of calorie intake throughout the day with the consumption of 4-5 meals/snacks    Improve shortness of breath  with ADL's Yes    Intervention Provide education, individualized exercise plan and daily activity instruction to help decrease symptoms of SOB with activities of daily living.    Expected Outcomes Short Term: Improve cardiorespiratory fitness to achieve a reduction of symptoms when performing ADLs;Long Term: Be able to perform more ADLs without symptoms or delay the onset of symptoms    Diabetes Yes   A1c now 5.3   Intervention Provide education about signs/symptoms and action to take for hypo/hyperglycemia.;Provide education about proper nutrition, including hydration, and aerobic/resistive exercise prescription along with prescribed medications to achieve blood glucose in normal ranges: Fasting glucose 65-99 mg/dL    Expected Outcomes Long Term: Attainment of HbA1C < 7%.;Short Term: Participant verbalizes understanding of the signs/symptoms and immediate care of hyper/hypoglycemia, proper foot care and importance of medication, aerobic/resistive exercise and nutrition plan for blood glucose control.    Heart Failure Yes    Intervention Provide a combined exercise and nutrition program that is supplemented with education, support and counseling about heart failure. Directed toward relieving symptoms such as shortness of breath, decreased exercise tolerance, and extremity edema.    Expected Outcomes Improve functional capacity of life;Short term: Attendance in program 2-3 days a week with increased exercise capacity. Reported lower sodium intake. Reported increased fruit and vegetable intake. Reports medication compliance.;Short term: Daily weights obtained and reported for increase. Utilizing diuretic protocols set by physician.;Long term: Adoption of self-care skills and reduction of barriers for early signs and symptoms recognition and intervention leading to self-care maintenance.    Hypertension Yes    Intervention Provide education on lifestyle modifcations including regular physical  activity/exercise, weight management, moderate sodium restriction and increased consumption of fresh fruit, vegetables, and low fat dairy, alcohol moderation, and smoking cessation.;Monitor prescription use compliance.    Expected Outcomes Short Term: Continued assessment and intervention until BP is < 140/82mm HG in hypertensive participants. < 130/79mm HG in hypertensive participants with diabetes, heart failure or chronic kidney disease.;Long Term: Maintenance of blood pressure at goal levels.  Lipids Yes    Intervention Provide education and support for participant on nutrition & aerobic/resistive exercise along with prescribed medications to achieve LDL 70mg , HDL >40mg .    Expected Outcomes Short Term: Participant states understanding of desired cholesterol values and is compliant with medications prescribed. Participant is following exercise prescription and nutrition guidelines.;Long Term: Cholesterol controlled with medications as prescribed, with individualized exercise RX and with personalized nutrition plan. Value goals: LDL < 70mg , HDL > 40 mg.             Core Components/Risk Factors/Patient Goals Review:    Core Components/Risk Factors/Patient Goals at Discharge (Final Review):    ITP Comments:  ITP Comments     Row Name 03/04/23 1547 03/17/23 1151         ITP Comments Patient attend orientation today.  Patient is attending Cardiac Rehabilitation Program.  Documentation for diagnosis can be found in [facility] 02/01/23.  Reviewed medical chart, RPE/RPD, gym safety, and program guidelines.  Patient was fitted to equipment they will be using during rehab.  Patient is scheduled to start exercise on Tuesday 03/09/23 at 1330.   Initial ITP created and sent for review and signature by Dr. Dina Rich, Medical Director for Cardiac Rehabilitation Program. 30 day review completed. ITP sent to Dr. Dina Rich, Medical Director of Cardiac Rehab. Continue with ITP unless changes are  made by physician.  New to program.               Comments: 30 day review

## 2023-03-18 ENCOUNTER — Encounter (HOSPITAL_COMMUNITY)
Admission: RE | Admit: 2023-03-18 | Discharge: 2023-03-18 | Disposition: A | Discharge status: Home / Self Care | Payer: Medicare HMO | Source: Ambulatory Visit | Attending: Internal Medicine | Admitting: Internal Medicine

## 2023-03-18 DIAGNOSIS — Z955 Presence of coronary angioplasty implant and graft: Secondary | ICD-10-CM | POA: Diagnosis not present

## 2023-03-18 DIAGNOSIS — I214 Non-ST elevation (NSTEMI) myocardial infarction: Secondary | ICD-10-CM

## 2023-03-18 NOTE — Progress Notes (Signed)
Daily Session Note  Patient Details  Name: Trevor [name] MRN: [id number] Date of Birth: [birth year] Referring Provider:   Flowsheet Row CARDIAC REHAB PHASE II ORIENTATION from 03/04/2023 in [facility] CARDIAC REHABILITATION  Referring Provider Richardo Priest MD  [facility] Cardiologist Dr.Vishnu Mallipeddi]       Encounter Date: 03/18/2023  Check In:  Session Check In - 03/18/23 1327       Check-In   Supervising physician immediately available to respond to emergencies See telemetry face sheet for immediately available ER MD    Location [facility]    Staff Present Rodena Medin, RN, BSN;Heather Fredric Mare, BS, Exercise Physiologist    Virtual Visit No    Medication changes reported     No    Fall or balance concerns reported    No    Warm-up and Cool-down Performed on first and last piece of equipment    Resistance Training Performed Yes    VAD Patient? No    PAD/SET Patient? No      Pain Assessment   Currently in Pain? No/denies    Multiple Pain Sites No             Capillary Blood Glucose: No results found for this or any previous visit (from the past 24 hour(s)).    Social History   Tobacco Use  Smoking Status Never  Smokeless Tobacco Never    Goals Met:  Independence with exercise equipment Exercise tolerated well No report of concerns or symptoms today Strength training completed today  Goals Unmet:  Not Applicable  Comments: Pt able to follow exercise prescription today without complaint.  Will continue to monitor for progression.

## 2023-03-19 DIAGNOSIS — Z992 Dependence on renal dialysis: Secondary | ICD-10-CM | POA: Diagnosis not present

## 2023-03-19 DIAGNOSIS — N186 End stage renal disease: Secondary | ICD-10-CM | POA: Diagnosis not present

## 2023-03-22 DIAGNOSIS — N186 End stage renal disease: Secondary | ICD-10-CM | POA: Diagnosis not present

## 2023-03-22 DIAGNOSIS — Z992 Dependence on renal dialysis: Secondary | ICD-10-CM | POA: Diagnosis not present

## 2023-03-23 ENCOUNTER — Encounter (HOSPITAL_COMMUNITY): Payer: Medicare HMO

## 2023-03-24 DIAGNOSIS — N186 End stage renal disease: Secondary | ICD-10-CM | POA: Diagnosis not present

## 2023-03-24 DIAGNOSIS — Z992 Dependence on renal dialysis: Secondary | ICD-10-CM | POA: Diagnosis not present

## 2023-03-25 ENCOUNTER — Encounter (HOSPITAL_COMMUNITY)
Admission: RE | Admit: 2023-03-25 | Discharge: 2023-03-25 | Disposition: A | Discharge status: Home / Self Care | Payer: Medicare HMO | Source: Ambulatory Visit | Attending: Internal Medicine | Admitting: Internal Medicine

## 2023-03-25 DIAGNOSIS — Z955 Presence of coronary angioplasty implant and graft: Secondary | ICD-10-CM | POA: Diagnosis not present

## 2023-03-25 DIAGNOSIS — I214 Non-ST elevation (NSTEMI) myocardial infarction: Secondary | ICD-10-CM | POA: Diagnosis not present

## 2023-03-25 NOTE — Progress Notes (Signed)
Daily Session Note  Patient Details  Name: ALTAIR REES MRN: [id number] Date of Birth: [birth year] Referring Provider:   Flowsheet Row CARDIAC REHAB PHASE II ORIENTATION from 03/04/2023 in [facility] CARDIAC REHABILITATION  Referring Provider Richardo Priest MD  [facility] Cardiologist Dr.Vishnu Mallipeddi]       Encounter Date: 03/25/2023  Check In:  Session Check In - 03/25/23 1330       Check-In   Supervising physician immediately available to respond to emergencies See telemetry face sheet for immediately available MD    Location [facility]    Staff Present Ross Ludwig, BS, Exercise Physiologist;Phyllis Billingsley, RN    Virtual Visit No    Medication changes reported     No    Fall or balance concerns reported    No    Tobacco Cessation No Change    Warm-up and Cool-down Performed on first and last piece of equipment    Resistance Training Performed Yes    VAD Patient? No    PAD/SET Patient? No      Pain Assessment   Currently in Pain? No/denies    Multiple Pain Sites No             Capillary Blood Glucose: No results found for this or any previous visit (from the past 24 hours).    Social History   Tobacco Use  Smoking Status Never  Smokeless Tobacco Never    Goals Met:  Independence with exercise equipment Exercise tolerated well No report of concerns or symptoms today Strength training completed today  Goals Unmet:  Not Applicable  Comments: Pt able to follow exercise prescription today without complaint.  Will continue to monitor for progression.

## 2023-03-26 DIAGNOSIS — Z992 Dependence on renal dialysis: Secondary | ICD-10-CM | POA: Diagnosis not present

## 2023-03-26 DIAGNOSIS — N186 End stage renal disease: Secondary | ICD-10-CM | POA: Diagnosis not present

## 2023-03-29 DIAGNOSIS — N186 End stage renal disease: Secondary | ICD-10-CM | POA: Diagnosis not present

## 2023-03-29 DIAGNOSIS — Z992 Dependence on renal dialysis: Secondary | ICD-10-CM | POA: Diagnosis not present

## 2023-03-30 ENCOUNTER — Encounter (HOSPITAL_COMMUNITY): Payer: Medicare HMO

## 2023-03-30 ENCOUNTER — Encounter (HOSPITAL_COMMUNITY)
Admission: RE | Admit: 2023-03-30 | Discharge: 2023-03-30 | Disposition: A | Discharge status: Home / Self Care | Payer: Medicare HMO | Source: Ambulatory Visit | Attending: Internal Medicine | Admitting: Internal Medicine

## 2023-03-30 DIAGNOSIS — I214 Non-ST elevation (NSTEMI) myocardial infarction: Secondary | ICD-10-CM

## 2023-03-30 DIAGNOSIS — Z955 Presence of coronary angioplasty implant and graft: Secondary | ICD-10-CM

## 2023-03-30 NOTE — Progress Notes (Signed)
Daily Session Note  Patient Details  Name: RITHY DORWARD MRN: [id number] Date of Birth: 1941-03-27 Referring Provider:   Flowsheet Row CARDIAC REHAB PHASE II ORIENTATION from 03/04/2023 in [facility] CARDIAC REHABILITATION  Referring Provider Richardo Priest MD  [facility] Cardiologist Dr.Vishnu Mallipeddi]       Encounter Date: 03/30/2023  Check In:  Session Check In - 03/30/23 1330       Check-In   Supervising physician immediately available to respond to emergencies See telemetry face sheet for immediately available MD    Location [facility]    Staff Present Ross Ludwig, BS, Exercise Physiologist;Other    Virtual Visit No    Medication changes reported     No    Fall or balance concerns reported    No    Tobacco Cessation No Change    Warm-up and Cool-down Performed on first and last piece of equipment    Resistance Training Performed Yes    VAD Patient? No    PAD/SET Patient? No      Pain Assessment   Currently in Pain? No/denies    Multiple Pain Sites No             Capillary Blood Glucose: No results found for this or any previous visit (from the past 24 hours).    Social History   Tobacco Use  Smoking Status Never  Smokeless Tobacco Never    Goals Met:  Independence with exercise equipment Exercise tolerated well No report of concerns or symptoms today Strength training completed today  Goals Unmet:  Not Applicable  Comments: Pt able to follow exercise prescription today without complaint.  Will continue to monitor for progression.

## 2023-03-31 DIAGNOSIS — N186 End stage renal disease: Secondary | ICD-10-CM | POA: Diagnosis not present

## 2023-03-31 DIAGNOSIS — Z992 Dependence on renal dialysis: Secondary | ICD-10-CM | POA: Diagnosis not present

## 2023-04-01 ENCOUNTER — Encounter (HOSPITAL_COMMUNITY)
Admission: RE | Admit: 2023-04-01 | Discharge: 2023-04-01 | Disposition: A | Discharge status: Home / Self Care | Payer: Medicare HMO | Source: Ambulatory Visit | Attending: Internal Medicine | Admitting: Internal Medicine

## 2023-04-01 DIAGNOSIS — Z955 Presence of coronary angioplasty implant and graft: Secondary | ICD-10-CM

## 2023-04-01 DIAGNOSIS — I214 Non-ST elevation (NSTEMI) myocardial infarction: Secondary | ICD-10-CM | POA: Diagnosis not present

## 2023-04-01 NOTE — Progress Notes (Signed)
Daily Session Note  Patient Details  Name: [name] MRN: [id number] Date of Birth: Jul 19, 1940 Referring Provider:   Flowsheet Row CARDIAC REHAB PHASE II ORIENTATION from 03/04/2023 in [facility] CARDIAC REHABILITATION  Referring Provider Richardo Priest MD  [facility] Cardiologist Dr.Vishnu Mallipeddi]       Encounter Date: 04/01/2023  Check In:  Session Check In - 04/01/23 1330       Check-In   Supervising physician immediately available to respond to emergencies See telemetry face sheet for immediately available MD    Location [facility]    Staff Present Ross Ludwig, BS, Exercise Physiologist;Other    Virtual Visit No    Medication changes reported     No    Fall or balance concerns reported    No    Tobacco Cessation No Change    Warm-up and Cool-down Performed on first and last piece of equipment    Resistance Training Performed Yes    VAD Patient? No    PAD/SET Patient? No      Pain Assessment   Currently in Pain? No/denies    Multiple Pain Sites No             Capillary Blood Glucose: No results found for this or any previous visit (from the past 24 hours).    Social History   Tobacco Use  Smoking Status Never  Smokeless Tobacco Never    Goals Met:  Independence with exercise equipment Exercise tolerated well No report of concerns or symptoms today Strength training completed today  Goals Unmet:  Not Applicable  Comments: Pt able to follow exercise prescription today without complaint.  Will continue to monitor for progression.

## 2023-04-02 DIAGNOSIS — Z992 Dependence on renal dialysis: Secondary | ICD-10-CM | POA: Diagnosis not present

## 2023-04-02 DIAGNOSIS — N186 End stage renal disease: Secondary | ICD-10-CM | POA: Diagnosis not present

## 2023-04-05 DIAGNOSIS — N186 End stage renal disease: Secondary | ICD-10-CM | POA: Diagnosis not present

## 2023-04-05 DIAGNOSIS — Z992 Dependence on renal dialysis: Secondary | ICD-10-CM | POA: Diagnosis not present

## 2023-04-06 ENCOUNTER — Encounter (HOSPITAL_COMMUNITY): Payer: Medicare HMO

## 2023-04-08 ENCOUNTER — Encounter (HOSPITAL_COMMUNITY): Payer: Medicare HMO

## 2023-04-08 DIAGNOSIS — Z992 Dependence on renal dialysis: Secondary | ICD-10-CM | POA: Diagnosis not present

## 2023-04-08 DIAGNOSIS — N186 End stage renal disease: Secondary | ICD-10-CM | POA: Diagnosis not present

## 2023-04-09 DIAGNOSIS — N186 End stage renal disease: Secondary | ICD-10-CM | POA: Diagnosis not present

## 2023-04-09 DIAGNOSIS — Z992 Dependence on renal dialysis: Secondary | ICD-10-CM | POA: Diagnosis not present

## 2023-04-12 DIAGNOSIS — Z992 Dependence on renal dialysis: Secondary | ICD-10-CM | POA: Diagnosis not present

## 2023-04-12 DIAGNOSIS — N186 End stage renal disease: Secondary | ICD-10-CM | POA: Diagnosis not present

## 2023-04-13 ENCOUNTER — Encounter (HOSPITAL_COMMUNITY): Payer: Medicare HMO

## 2023-04-13 ENCOUNTER — Encounter (HOSPITAL_COMMUNITY): Payer: Self-pay | Admitting: *Deleted

## 2023-04-13 DIAGNOSIS — Z955 Presence of coronary angioplasty implant and graft: Secondary | ICD-10-CM

## 2023-04-13 DIAGNOSIS — Z992 Dependence on renal dialysis: Secondary | ICD-10-CM | POA: Diagnosis not present

## 2023-04-13 DIAGNOSIS — I214 Non-ST elevation (NSTEMI) myocardial infarction: Secondary | ICD-10-CM

## 2023-04-13 DIAGNOSIS — N186 End stage renal disease: Secondary | ICD-10-CM | POA: Diagnosis not present

## 2023-04-13 NOTE — Progress Notes (Signed)
 Cardiac Individual Treatment Plan  Patient Details  Name: Trevor [name] MRN: [id number] Date of Birth: [birth year] Referring Provider:   Flowsheet Row CARDIAC REHAB PHASE II ORIENTATION from 03/04/2023 in [facility] CARDIAC REHABILITATION  Referring Provider Dede Haws MD  [facility] Cardiologist Dr.Vishnu Mallipeddi]       Initial Encounter Date:  Flowsheet Row CARDIAC REHAB PHASE II ORIENTATION from 03/04/2023 in [place] CARDIAC REHABILITATION  Date 03/04/23       Visit Diagnosis: NSTEMI (non-ST elevated myocardial infarction) [facility])  Status post coronary artery stent placement  Patient's Home Medications on Admission:  Current Outpatient Medications:    ACCU-CHEK AVIVA PLUS test strip, USE TO TEST BLOOD SUGAR BEFORE BREAKFAST AND AT BEDTIME, Disp: 200 strip, Rfl: 2   Accu-Chek Softclix Lancets lancets, , Disp: , Rfl:    acetaminophen  (TYLENOL ) 325 MG tablet, Take 1-2 tablets (325-650 mg total) by mouth every 6 (six) hours as needed for mild pain (pain score 1-3 or temp > 100.5)., Disp: , Rfl:    albuterol (VENTOLIN HFA) 108 (90 Base) MCG/ACT inhaler, Inhale 2 puffs every 4 hours by inhalation route., Disp: , Rfl:    Alcohol Swabs  (B-D SINGLE USE SWABS  REGULAR) PADS, , Disp: , Rfl:    aspirin  EC 81 MG tablet, Take 1 tablet (81 mg total) by mouth daily with breakfast. Swallow whole., Disp: 30 tablet, Rfl: 12   calcitRIOL  (ROCALTROL ) 0.25 MCG capsule, Take 5 capsules (1.25 mcg total) by mouth every Monday, Wednesday, and Friday with hemodialysis., Disp: 60 capsule, Rfl: 0   carvedilol  (COREG ) 12.5 MG tablet, Take 12.5 mg by mouth 2 (two) times daily with a meal., Disp: , Rfl:    Cholecalciferol  (VITAMIN D -3 PO), Take 1 tablet by mouth daily., Disp: , Rfl:    CIPRO 250 MG tablet, Take 1 tablet every day by oral route for 7 days., Disp: , Rfl:    CRANBERRY EXTRACT PO, Take 1 tablet by mouth daily., Disp: , Rfl:    hydrALAZINE  (APRESOLINE ) 50 MG tablet, Take 50 mg by mouth 2  (two) times daily., Disp: , Rfl:    insulin  degludec (TRESIBA FLEXTOUCH) 200 UNIT/ML FlexTouch Pen, 10-24 Units as needed (blood sugar). Sliding scale Under 100 Do Not Take (Patient not taking: Reported on 03/04/2023), Disp: , Rfl:    isosorbide  mononitrate (IMDUR ) 30 MG 24 hr tablet, Take 1 tablet (30 mg total) by mouth daily., Disp: 30 tablet, Rfl: 2   lactulose  (CHRONULAC ) 10 GM/15ML solution, Take 20 g by mouth daily as needed for moderate constipation., Disp: , Rfl:    nitroGLYCERIN  (NITROSTAT ) 0.4 MG SL tablet, Place 1 tablet (0.4 mg total) under the tongue every 5 (five) minutes as needed for chest pain., Disp: 25 tablet, Rfl: 3   ondansetron  (ZOFRAN ) 8 MG tablet, , Disp: , Rfl:    rosuvastatin  (CRESTOR ) 10 MG tablet, Take 10 mg by mouth every evening., Disp: , Rfl:    ticagrelor  (BRILINTA ) 90 MG TABS tablet, Take 1 tablet (90 mg total) by mouth 2 (two) times daily., Disp: 60 tablet, Rfl: 11  Past Medical History: Past Medical History:  Diagnosis Date   Anemia    Chronic kidney disease    Stage 4?    Diabetes mellitus without complication (HCC)    Hepatitis    not sure what type - over 20 years ago   History of kidney stones    Hyperlipidemia    Hypertension     Tobacco Use: Social History   Tobacco Use  Smoking Status Never  Smokeless Tobacco Never    Labs: Review Flowsheet  More data exists      Latest Ref Rng & Units 12/17/2020 07/22/2022 07/23/2022 02/01/2023 02/03/2023  Labs for ITP Cardiac and Pulmonary Rehab  Cholestrol 0 - 200 mg/dL - - - - 899   LDL (calc) 0 - 99 mg/dL - - - - 42   HDL-C >59 mg/dL - - - - 47   Trlycerides <150 mg/dL - - - - 55   Hemoglobin A1c 4.8 - 5.6 % - 5.7  - 5.3  -  TCO2 22 - 32 mmol/L 27  - 28  - -    Capillary Blood Glucose: Lab Results  Component Value Date   GLUCAP 90 03/09/2023   GLUCAP 96 03/09/2023   GLUCAP 174 (H) 02/03/2023   GLUCAP 158 (H) 02/03/2023   GLUCAP 164 (H) 02/03/2023     Exercise Target Goals: Exercise  Program Goal: Individual exercise prescription set using results from initial 6 min walk test and THRR while considering  patient's activity barriers and safety.   Exercise Prescription Goal: Starting with aerobic activity 30 plus minutes a day, 3 days per week for initial exercise prescription. Provide home exercise prescription and guidelines that participant acknowledges understanding prior to discharge.  Activity Barriers & Risk Stratification:  Activity Barriers & Cardiac Risk Stratification - 03/04/23 1344       Activity Barriers & Cardiac Risk Stratification   Activity Barriers Left Hip Replacement;Right Hip Replacement;History of Falls;Deconditioning;Balance Concerns;Muscular Weakness;Shortness of Breath    Cardiac Risk Stratification High             6 Minute Walk:  6 Minute Walk     Row Name 03/04/23 1554         6 Minute Walk   Phase Initial     Distance 880 feet     Walk Time 6 minutes     # of Rest Breaks 0     MPH 1.66     METS 1.78     RPE 11     Perceived Dyspnea  1     VO2 Peak 6.24     Symptoms Yes (comment)     Comments release in chest tightness, slightly SOB     Resting HR 78 bpm     Resting BP 116/62     Resting Oxygen Saturation  98 %     Exercise Oxygen Saturation  during 6 min walk 95 %     Max Ex. HR 92 bpm     Max Ex. BP 172/84     2 Minute Post BP 142/74              Oxygen Initial Assessment:   Oxygen Re-Evaluation:   Oxygen Discharge (Final Oxygen Re-Evaluation):   Initial Exercise Prescription:  Initial Exercise Prescription - 03/04/23 1500       Date of Initial Exercise RX and Referring Provider   Date 03/04/23    Referring Provider Dede Haws MD   Primary Cardiologist Dr.Vishnu Mallipeddi     Oxygen   Maintain Oxygen Saturation 88% or higher      Treadmill   MPH 1.2    Grade 0.5    Minutes 15    METs 2      NuStep   Level 2    SPM 80    Minutes 15    METs 2      Prescription Details   Frequency  (times per week)  2    Duration Progress to 30 minutes of continuous aerobic without signs/symptoms of physical distress      Intensity   THRR 40-80% of Max Heartrate 102-127    Ratings of Perceived Exertion 11-13    Perceived Dyspnea 0-4      Progression   Progression Continue to progress workloads to maintain intensity without signs/symptoms of physical distress.      Resistance Training   Training Prescription Yes    Weight 3 lb    Reps 10-15             Perform Capillary Blood Glucose checks as needed.  Exercise Prescription Changes:   Exercise Prescription Changes     Row Name 03/04/23 1500 03/09/23 1500 03/25/23 1400 04/01/23 1500       Response to Exercise   Blood Pressure (Admit) 116/62 122/52 142/52 122/50    Blood Pressure (Exercise) 172/84 132/62 148/68 --    Blood Pressure (Exit) 142/74 120/60 138/68 128/58    Heart Rate (Admit) 78 bpm 57 bpm 64 bpm 65 bpm    Heart Rate (Exercise) 92 bpm 93 bpm 96 bpm 120 bpm    Heart Rate (Exit) 74 bpm 75 bpm 86 bpm 62 bpm    Oxygen Saturation (Admit) 98 % -- -- --    Oxygen Saturation (Exercise) 95 % -- -- --    Rating of Perceived Exertion (Exercise) 11 12 12 12     Perceived Dyspnea (Exercise) 1 -- -- 1    Symptoms chest tightness released, Slightly SOB -- -- --    Comments walk test results -- -- --    Duration -- Continue with 30 min of aerobic exercise without signs/symptoms of physical distress. Continue with 30 min of aerobic exercise without signs/symptoms of physical distress. Continue with 30 min of aerobic exercise without signs/symptoms of physical distress.    Intensity -- THRR unchanged THRR unchanged THRR unchanged      Progression   Progression -- Continue to progress workloads to maintain intensity without signs/symptoms of physical distress. Continue to progress workloads to maintain intensity without signs/symptoms of physical distress. Continue to progress workloads to maintain intensity without  signs/symptoms of physical distress.      Resistance Training   Training Prescription -- Yes Yes Yes    Weight -- 3 lbs 3 lbs 4 lbs    Reps -- 10-15 10-15 10-15      Oxygen   Oxygen -- Continuous -- --      Treadmill   MPH -- 1.2 -- --    Grade -- 0.5 -- --    Minutes -- 15 -- --    METs -- 2 -- --      NuStep   Level -- 1 4 4     SPM -- 60 59 60    Minutes -- 15 15 15     METs -- 1.5 1.9 1.95      Track   Laps -- 20 18 22     Minutes -- -- 15 15    METs -- -- 1.7 2      Oxygen   Maintain Oxygen Saturation -- 88% or higher 88% or higher 88% or higher             Exercise Comments:   Exercise Goals and Review:   Exercise Goals     Row Name 03/04/23 1602             Exercise Goals   Increase Physical Activity Yes  Intervention Provide advice, education, support and counseling about physical activity/exercise needs.;Develop an individualized exercise prescription for aerobic and resistive training based on initial evaluation findings, risk stratification, comorbidities and participant's personal goals.       Expected Outcomes Short Term: Attend rehab on a regular basis to increase amount of physical activity.;Long Term: Add in home exercise to make exercise part of routine and to increase amount of physical activity.;Long Term: Exercising regularly at least 3-5 days a week.       Increase Strength and Stamina Yes       Intervention Provide advice, education, support and counseling about physical activity/exercise needs.;Develop an individualized exercise prescription for aerobic and resistive training based on initial evaluation findings, risk stratification, comorbidities and participant's personal goals.       Expected Outcomes Short Term: Increase workloads from initial exercise prescription for resistance, speed, and METs.;Short Term: Perform resistance training exercises routinely during rehab and add in resistance training at home;Long Term: Improve  cardiorespiratory fitness, muscular endurance and strength as measured by increased METs and functional capacity ( )       Able to understand and use rate of perceived exertion (RPE) scale Yes       Intervention Provide education and explanation on how to use RPE scale       Expected Outcomes Short Term: Able to use RPE daily in rehab to express subjective intensity level;Long Term:  Able to use RPE to guide intensity level when exercising independently       Able to understand and use Dyspnea scale Yes       Intervention Provide education and explanation on how to use Dyspnea scale       Expected Outcomes Short Term: Able to use Dyspnea scale daily in rehab to express subjective sense of shortness of breath during exertion;Long Term: Able to use Dyspnea scale to guide intensity level when exercising independently       Knowledge and understanding of Target Heart Rate Range (THRR) Yes       Intervention Provide education and explanation of THRR including how the numbers were predicted and where they are located for reference       Expected Outcomes Short Term: Able to state/look up THRR;Long Term: Able to use THRR to govern intensity when exercising independently;Short Term: Able to use daily as guideline for intensity in rehab       Able to check pulse independently Yes       Intervention Provide education and demonstration on how to check pulse in carotid and radial arteries.;Review the importance of being able to check your own pulse for safety during independent exercise       Expected Outcomes Short Term: Able to explain why pulse checking is important during independent exercise;Long Term: Able to check pulse independently and accurately       Understanding of Exercise Prescription Yes       Intervention Provide education, explanation, and written materials on patient's individual exercise prescription       Expected Outcomes Short Term: Able to explain program exercise prescription;Long  Term: Able to explain home exercise prescription to exercise independently                Exercise Goals Re-Evaluation :  Exercise Goals Re-Evaluation     Row Name 03/30/23 1456 04/09/23 0801           Exercise Goal Re-Evaluation   Exercise Goals Review Increase Physical Activity;Increase Strength and Stamina;Understanding of Exercise Prescription Increase Physical  Activity;Increase Strength and Stamina;Understanding of Exercise Prescription      Comments Tieler is doing well in rehab. He has been coming in late missing the warm up, today i reminded him of the time we start. He has noticed an increase in his endurance and his SOB decreaing when doing activies. He is walking at homw in the evenings. Yoandri is doing well in rehab and is tolerating exercise okay. He is walking the track due to not liking the treadmill. He has been coming in late to rehab and will miss all the warmup some. Will continue to monitor and progressed as able,      Expected Outcomes Short: keep exercising in at rehab   long term: continue to exericse at home and rehab Short: keep coming to rehab on time and regular basis   long term: continue to attend rehab                Discharge Exercise Prescription (Final Exercise Prescription Changes):  Exercise Prescription Changes - 04/01/23 1500       Response to Exercise   Blood Pressure (Admit) 122/50    Blood Pressure (Exit) 128/58    Heart Rate (Admit) 65 bpm    Heart Rate (Exercise) 120 bpm    Heart Rate (Exit) 62 bpm    Rating of Perceived Exertion (Exercise) 12    Perceived Dyspnea (Exercise) 1    Duration Continue with 30 min of aerobic exercise without signs/symptoms of physical distress.    Intensity THRR unchanged      Progression   Progression Continue to progress workloads to maintain intensity without signs/symptoms of physical distress.      Resistance Training   Training Prescription Yes    Weight 4 lbs    Reps 10-15      NuStep    Level 4    SPM 60    Minutes 15    METs 1.95      Track   Laps 22    Minutes 15    METs 2      Oxygen   Maintain Oxygen Saturation 88% or higher             Nutrition:  Target Goals: Understanding of nutrition guidelines, daily intake of sodium 1500mg , cholesterol 200mg , calories 30% from fat and 7% or less from saturated fats, daily to have 5 or more servings of fruits and vegetables.  Biometrics:  Pre Biometrics - 03/04/23 1602       Pre Biometrics   Height 5' 10 (1.778 m)    Weight 195 lb 14.4 oz (88.9 kg)    Waist Circumference 37 inches    Hip Circumference 41 inches    Waist to Hip Ratio 0.9 %    BMI (Calculated) 28.11    Grip Strength 18.6 kg    Single Leg Stand 4.9 seconds              Nutrition Therapy Plan and Nutrition Goals:  Nutrition Therapy & Goals - 03/04/23 1611       Intervention Plan   Intervention Prescribe, educate and counsel regarding individualized specific dietary modifications aiming towards targeted core components such as weight, hypertension, lipid management, diabetes, heart failure and other comorbidities.;Nutrition handout(s) given to patient.    Expected Outcomes Short Term Goal: Understand basic principles of dietary content, such as calories, fat, sodium, cholesterol and nutrients.;Long Term Goal: Adherence to prescribed nutrition plan.  Nutrition Assessments:  MEDIFICTS Score Key: >=70 Need to make dietary changes  40-70 Heart Healthy Diet <= 40 Therapeutic Level Cholesterol Diet  Flowsheet Row CARDIAC REHAB PHASE II ORIENTATION from 03/04/2023 in [facility] CARD[facility]AC [facility]ILITATION  Picture Your Plate Total Score on Admission 74      Picture Your Plate Scores: <59 Unhealthy dietary pattern with much room for improvement. 41-50 Dietary pattern unlikely to meet recommendations for good health and room for improvement. 51-60 More healthful dietary pattern, with some room for improvement.  >60  Healthy dietary pattern, although there may be some specific behaviors that could be improved.    Nutrition Goals Re-Evaluation:  Nutrition Goals Re-Evaluation     Row Name 03/30/23 1501             Goals   Nutrition Goal healthy eating       Comment Arlander is doing well in rehab. He is eating healthy at home. He said that he eats mostly chicken, turkey and fish. He aslo said that he eats collard greens. He only drinks mostly water  and does not really drink soda and tea.       Expected Outcome Short: continue to pick healthy options and drink more water    long term: cotninuet with healthy eating                Nutrition Goals Discharge (Final Nutrition Goals Re-Evaluation):  Nutrition Goals Re-Evaluation - 03/30/23 1501       Goals   Nutrition Goal healthy eating    Comment Jakobe is doing well in rehab. He is eating healthy at home. He said that he eats mostly chicken, turkey and fish. He aslo said that he eats collard greens. He only drinks mostly water  and does not really drink soda and tea.    Expected Outcome Short: continue to pick healthy options and drink more water    long term: cotninuet with healthy eating             Psychosocial: Target Goals: Acknowledge presence or absence of significant depression and/or stress, maximize coping skills, provide positive support system. Participant is able to verbalize types and ability to use techniques and skills needed for reducing stress and depression.  Initial Review & Psychosocial Screening:  Initial Psych Review & Screening - 03/04/23 1355       Initial Review   Current issues with Current Stress Concerns;Current Sleep Concerns    Source of Stress Concerns Unable to participate in former interests or hobbies;Unable to perform yard/household activities;Chronic Illness;Family    Comments Patient's wife lung cancer survivor, and son two strokes. Not sleeping well recently (tried melatonin to help) from feeling like he  is SOB when he lays down      Family Dynamics   Good Support System? Yes   wife, sister     Barriers   Psychosocial barriers to participate in program The patient should benefit from training in stress management and relaxation.;Psychosocial barriers identified (see note)      Screening Interventions   Interventions Encouraged to exercise;To provide support and resources with identified psychosocial needs;Provide feedback about the scores to participant    Expected Outcomes Short Term goal: Utilizing psychosocial counselor, staff and physician to assist with identification of specific Stressors or current issues interfering with healing process. Setting desired goal for each stressor or current issue identified.;Long Term Goal: Stressors or current issues are controlled or eliminated.;Short Term goal: Identification and review with participant of any Quality of Life or Depression  concerns found by scoring the questionnaire.;Long Term goal: The participant improves quality of Life and PHQ9 Scores as seen by post scores and/or verbalization of changes             Quality of Life Scores:  Quality of Life - 03/04/23 1611       Quality of Life   Select Quality of Life      Quality of Life Scores   Health/Function Pre 24 %    Socioeconomic Pre 24.86 %    Psych/Spiritual Pre 26.57 %    Family Pre 25.2 %    GLOBAL Pre 24.88 %            Scores of 19 and below usually indicate a poorer quality of life in these areas.  A difference of  2-3 points is a clinically meaningful difference.  A difference of 2-3 points in the total score of the Quality of Life Index has been associated with significant improvement in overall quality of life, self-image, physical symptoms, and general health in studies assessing change in quality of life.  PHQ-9: Review Flowsheet       03/04/2023 02/11/2023  Depression screen PHQ 2/9  Decreased Interest 0 0  Down, Depressed, Hopeless 1 1  PHQ - 2 Score 1  1  Altered sleeping 3 -  Tired, decreased energy 0 -  Change in appetite 0 -  Feeling bad or failure about yourself  1 -  Trouble concentrating 1 -  Moving slowly or fidgety/restless 1 -  Suicidal thoughts 0 -  PHQ-9 Score 7 -  Difficult doing work/chores Very difficult -   Interpretation of Total Score  Total Score Depression Severity:  1-4 = Minimal depression, 5-9 = Mild depression, 10-14 = Moderate depression, 15-19 = Moderately severe depression, 20-27 = Severe depression   Psychosocial Evaluation and Intervention:  Psychosocial Evaluation - 03/04/23 1607       Psychosocial Evaluation & Interventions   Interventions Stress management education;Encouraged to exercise with the program and follow exercise prescription    Comments Atzel is coming into cardiac rehab after a NSTEMI and second stent.  He was not able to start last month due to the MI.  Since last review he is having a hard time sleeping as he feels short of breath any time he lays down.  He is still on dialysis MWF and will attend program on TTh.  He is looking forwad to program and hopes it will help with his breathing and getting back his stamina.  He wants to improve his heart health and build up his strength and stamina. He now covered at 100%.    Expected Outcomes Short term: Start and attend the program consistently. Long Term: Build up stamina to be able to go and do again    Continue Psychosocial Services  Follow up required by staff             Psychosocial Re-Evaluation:  Psychosocial Re-Evaluation     Row Name 03/30/23 1459             Psychosocial Re-Evaluation   Current issues with None Identified       Comments Sudeep is doing well in rehab. He is going to dyalisis throughout the week. He stated that he has no sleep issues other then gettiung up 2xs a noight to use the restroom. He also stated that he has not stress concerns.       Expected Outcomes Short: continue to have no barriers long  term: continue to exerciuse to help overall well being       Interventions Encouraged to attend Cardiac Rehabilitation for the exercise       Continue Psychosocial Services  Follow up required by staff                Psychosocial Discharge (Final Psychosocial Re-Evaluation):  Psychosocial Re-Evaluation - 03/30/23 1459       Psychosocial Re-Evaluation   Current issues with None Identified    Comments Layne is doing well in rehab. He is going to dyalisis throughout the week. He stated that he has no sleep issues other then gettiung up 2xs a noight to use the restroom. He also stated that he has not stress concerns.    Expected Outcomes Short: continue to have no barriers long term: continue to exerciuse to help overall well being    Interventions Encouraged to attend Cardiac Rehabilitation for the exercise    Continue Psychosocial Services  Follow up required by staff             Vocational Rehabilitation: Provide vocational rehab assistance to qualifying candidates.   Vocational Rehab Evaluation & Intervention:  Vocational Rehab - 03/04/23 1358       Initial Vocational Rehab Evaluation & Intervention   Assessment shows need for Vocational Rehabilitation No   retired            Education: Education Goals: Education classes will be provided on a weekly basis, covering required topics. Participant will state understanding/return demonstration of topics presented.  Learning Barriers/Preferences:  Learning Barriers/Preferences - 03/04/23 1357       Learning Barriers/Preferences   Learning Barriers Sight   glasses for reading   Learning Preferences Written Material             Education Topics: Hypertension, Hypertension Reduction -Define heart disease and high blood pressure. Discus how high blood pressure affects the body and ways to reduce high blood pressure.   Exercise and Your Heart -Discuss why it is important to exercise, the FITT principles of  exercise, normal and abnormal responses to exercise, and how to exercise safely. Flowsheet Row CARDIAC REHAB PHASE II EXERCISE from 04/01/2023 in [place] CARDIAC REHABILITATION  Date 03/25/23  Educator hb  Instruction Review Code 1- Verbalizes Understanding       Angina -Discuss definition of angina, causes of angina, treatment of angina, and how to decrease risk of having angina.   Cardiac Medications -Review what the following cardiac medications are used for, how they affect the body, and side effects that may occur when taking the medications.  Medications include Aspirin , Beta blockers, calcium  channel blockers, ACE Inhibitors, angiotensin receptor blockers, diuretics, digoxin, and antihyperlipidemics.   Congestive Heart Failure -Discuss the definition of CHF, how to live with CHF, the signs and symptoms of CHF, and how keep track of weight and sodium intake.   Heart Disease and Intimacy -Discus the effect sexual activity has on the heart, how changes occur during intimacy as we age, and safety during sexual activity. Flowsheet Row CARDIAC REHAB PHASE II EXERCISE from 04/01/2023 in [place] CARDIAC REHABILITATION  Date 03/18/23  Educator HB  Instruction Review Code 1- Verbalizes Understanding       Smoking Cessation / COPD -Discuss different methods to quit smoking, the health benefits of quitting smoking, and the definition of COPD.   Nutrition I: Fats -Discuss the types of cholesterol, what cholesterol does to the heart, and how cholesterol levels can be controlled. Flowsheet Row  CARDIAC REHAB PHASE II EXERCISE from 04/01/2023 in [facility]  Date 04/01/23  Educator HB  Instruction Review Code 1- Verbalizes Understanding       Nutrition II: Labels -Discuss the different components of food labels and how to read food label Flowsheet Row CARDIAC REHAB PHASE II EXERCISE from 04/01/2023 in [facility]  Date 04/01/23   Educator HB  Instruction Review Code 1- Verbalizes Understanding       Heart Parts/Heart Disease and PAD -Discuss the anatomy of the heart, the pathway of blood circulation through the heart, and these are affected by heart disease.   Stress I: Signs and Symptoms -Discuss the causes of stress, how stress may lead to anxiety and depression, and ways to limit stress.   Stress II: Relaxation -Discuss different types of relaxation techniques to limit stress.   Warning Signs of Stroke / TIA -Discuss definition of a stroke, what the signs and symptoms are of a stroke, and how to identify when someone is having stroke.   Knowledge Questionnaire Score:  Knowledge Questionnaire Score - 03/04/23 1615       Knowledge Questionnaire Score   Pre Score 15/24             Core Components/Risk Factors/Patient Goals at Admission:  Personal Goals and Risk Factors at Admission - 03/04/23 1615       Core Components/Risk Factors/Patient Goals on Admission    Weight Management Yes;Weight Loss    Intervention Weight Management: Develop a combined nutrition and exercise program designed to reach desired caloric intake, while maintaining appropriate intake of nutrient and fiber, sodium and fats, and appropriate energy expenditure required for the weight goal.;Weight Management: Provide education and appropriate resources to help participant work on and attain dietary goals.;Weight Management/Obesity: Establish reasonable short term and long term weight goals.    Admit Weight 195 lb 14.4 oz (88.9 kg)    Goal Weight: Short Term 190 lb (86.2 kg)    Goal Weight: Long Term 190 lb (86.2 kg)    Expected Outcomes Long Term: Adherence to nutrition and physical activity/exercise program aimed toward attainment of established weight goal;Short Term: Continue to assess and modify interventions until short term weight is achieved;Weight Maintenance: Understanding of the daily nutrition guidelines, which  includes 25-35% calories from fat, 7% or less cal from saturated fats, less than 200mg  cholesterol, less than 1.5gm of sodium, & 5 or more servings of fruits and vegetables daily;Weight Loss: Understanding of general recommendations for a balanced deficit meal plan, which promotes 1-2 lb weight loss per week and includes a negative energy balance of [phone] kcal/d;Understanding recommendations for meals to include 15-35% energy as protein, 25-35% energy from fat, 35-60% energy from carbohydrates, less than 200mg  of dietary cholesterol, 20-35 gm of total fiber daily;Understanding of distribution of calorie intake throughout the day with the consumption of 4-5 meals/snacks    Improve shortness of breath with ADL's Yes    Intervention Provide education, individualized exercise plan and daily activity instruction to help decrease symptoms of SOB with activities of daily living.    Expected Outcomes Short Term: Improve cardiorespiratory fitness to achieve a reduction of symptoms when performing ADLs;Long Term: Be able to perform more ADLs without symptoms or delay the onset of symptoms    Diabetes Yes   A1c now 5.3   Intervention Provide education about signs/symptoms and action to take for hypo/hyperglycemia.;Provide education about proper nutrition, including hydration, and aerobic/resistive exercise prescription along with prescribed medications to achieve  blood glucose in normal ranges: Fasting glucose 65-99 mg/dL    Expected Outcomes Long Term: Attainment of HbA1C < 7%.;Short Term: Participant verbalizes understanding of the signs/symptoms and immediate care of hyper/hypoglycemia, proper foot care and importance of medication, aerobic/resistive exercise and nutrition plan for blood glucose control.    Heart Failure Yes    Intervention Provide a combined exercise and nutrition program that is supplemented with education, support and counseling about heart failure. Directed toward relieving symptoms such as  shortness of breath, decreased exercise tolerance, and extremity edema.    Expected Outcomes Improve functional capacity of life;Short term: Attendance in program 2-3 days a week with increased exercise capacity. Reported lower sodium intake. Reported increased fruit and vegetable intake. Reports medication compliance.;Short term: Daily weights obtained and reported for increase. Utilizing diuretic protocols set by physician.;Long term: Adoption of self-care skills and reduction of barriers for early signs and symptoms recognition and intervention leading to self-care maintenance.    Hypertension Yes    Intervention Provide education on lifestyle modifcations including regular physical activity/exercise, weight management, moderate sodium restriction and increased consumption of fresh fruit, vegetables, and low fat dairy, alcohol moderation, and smoking cessation.;Monitor prescription use compliance.    Expected Outcomes Short Term: Continued assessment and intervention until BP is < 140/20mm HG in hypertensive participants. < 130/26mm HG in hypertensive participants with diabetes, heart failure or chronic kidney disease.;Long Term: Maintenance of blood pressure at goal levels.    Lipids Yes    Intervention Provide education and support for participant on nutrition & aerobic/resistive exercise along with prescribed medications to achieve LDL 70mg , HDL >40mg .    Expected Outcomes Short Term: Participant states understanding of desired cholesterol values and is compliant with medications prescribed. Participant is following exercise prescription and nutrition guidelines.;Long Term: Cholesterol controlled with medications as prescribed, with individualized exercise RX and with personalized nutrition plan. Value goals: LDL < 70mg , HDL > 40 mg.             Core Components/Risk Factors/Patient Goals Review:   Goals and Risk Factor Review     Row Name 03/30/23 1506             Core Components/Risk  Factors/Patient Goals Review   Personal Goals Review Weight Management/Obesity;Improve shortness of breath with ADL's;Hypertension       Review Johnthomas is doing well in rehab. He was having a lower BP but after water  it come up some. He is checking his Blood pressure at home as well. He has noticed an decrease in his SOB with his everyday activties.       Expected Outcomes Short: continue to drink water  before exerciuse   long term: continue to monitor risk factors                Core Components/Risk Factors/Patient Goals at Discharge (Final Review):   Goals and Risk Factor Review - 03/30/23 1506       Core Components/Risk Factors/Patient Goals Review   Personal Goals Review Weight Management/Obesity;Improve shortness of breath with ADL's;Hypertension    Review Ladale is doing well in rehab. He was having a lower BP but after water  it come up some. He is checking his Blood pressure at home as well. He has noticed an decrease in his SOB with his everyday activties.    Expected Outcomes Short: continue to drink water  before exerciuse   long term: continue to monitor risk factors             ITP Comments:  ITP Comments     Row Name 03/04/23 1547 03/17/23 1151 04/13/23 1432       ITP Comments Patient attend orientation today.  Patient is attending Cardiac Rehabilitation Program.  Documentation for diagnosis can be found in [facility] 02/01/23.  Reviewed medical chart, RPE/RPD, gym safety, and program guidelines.  Patient was fitted to equipment they will be using during rehab.  Patient is scheduled to start exercise on Tuesday 03/09/23 at 1330.   Initial ITP created and sent for review and signature by Dr. Dorn Ross, Medical Director for Cardiac Rehabilitation Program. 30 day review completed. ITP sent to Dr. Dorn Ross, Medical Director of Cardiac Rehab. Continue with ITP unless changes are made by physician.  New to program. 30 day review completed. ITP sent to Dr. Dorn Ross,  Medical Director of Cardiac Rehab. Continue with ITP unless changes are made by physician.              Comments: 30 day review

## 2023-04-14 DIAGNOSIS — Z992 Dependence on renal dialysis: Secondary | ICD-10-CM | POA: Diagnosis not present

## 2023-04-14 DIAGNOSIS — N186 End stage renal disease: Secondary | ICD-10-CM | POA: Diagnosis not present

## 2023-04-15 ENCOUNTER — Encounter (HOSPITAL_COMMUNITY): Payer: Medicare HMO

## 2023-04-15 ENCOUNTER — Ambulatory Visit: Payer: Self-pay | Admitting: *Deleted

## 2023-04-15 ENCOUNTER — Encounter: Payer: Self-pay | Admitting: *Deleted

## 2023-04-15 NOTE — Patient Outreach (Signed)
  Care Coordination   Follow Up Visit Note   04/15/2023 Name: Trevor [name] MRN: [id number] DOB: [birth year]  Trevor [name] is a 83 y.o. year old male who sees Shona, Norleen PEDLAR, MD for primary care. I spoke with  Trevor [name] by phone today.  What matters to the patients health and wellness today?   He has made goals to stop eating fried foods, continue to do whatever it takes this year to remain healthy (gym, walk, nutrition, monitoring BP/cbg values+).  Since the last nurse care coordinator outreach with Mr. Trevor [name], he has attended cardiac rehab. In 2025 he plans to continue the exercises taught during those structured sessions. He agrees to use his [employer] coverage's benefits to go to the gym, continue to walk & to continue collaborating with his nutritionist at dialysis. He confirms his blood pressure and blood sugar values are within normal limits. His blood pressure is checked frequently at dialysis & home. His HgA1c is report to be at the goal Dr Shona prefers. Patient states Dr Shona discontinued his diabetes medicines for now. Agrees to monitor for low values related to his cbg & BP this year He remains with his [employer] coverage and voice understanding that if he needs a change in coverage, he can outreach to the insurer during the discussed grace period. He did have the opportunity to review & compare coverages with someone prior to March 28 2023. He has his next upcoming appointment with Dr Shona in March 2025 and agrees for RN CM to reach out to him on March 4th for follow up.     Goals Addressed             This Visit's Progress    Decrease eating fried foods, continue to do whatever it takes this year to remain healthy.   On track    Interventions Today    Flowsheet Row Most Recent Value  Chronic Disease   Chronic disease during today's visit Congestive Heart Failure (CHF), Hypertension (HTN), Diabetes, Chronic Kidney Disease/End Stage Renal Disease (ESRD)  [confirmed he  attended cardiac rehab & now continues to complete the exercise at home with his treadmill, walking,  Dialysis is going fine.having frequently BP checks 140/60 average.  He reports he is eating better (no fried foods) Sees HD dietitian]  General Interventions   General Interventions Discussed/Reviewed General Interventions Reviewed, [facility], Doctor Visits  Doctor Visits Discussed/Reviewed Doctor Visits Reviewed, PCP, Specialist  PCP/Specialist Visits Compliance with follow-up visit  Exercise Interventions   Exercise Discussed/Reviewed Exercise Reviewed, Physical Activity, Assistive device use and maintanence  Physical Activity Discussed/Reviewed Physical Activity Reviewed, Gym  Education Interventions   Education Provided Provided Education  [grace period for insurance coverage changes]  Provided Verbal Education On Development Worker, Community, [employer]  Mental Health Interventions   Mental Health Discussed/Reviewed Mental Health Reviewed, Coping Strategies  Nutrition Interventions   Nutrition Discussed/Reviewed Nutrition Reviewed, Decreasing fats  Pharmacy Interventions   Pharmacy Dicussed/Reviewed Pharmacy Topics Reviewed, Medications and their functions, Affording Medications              SDOH assessments and interventions completed:  No     Care Coordination Interventions:  Yes, provided   Follow up plan: Follow up call scheduled for 06/15/23    Encounter Outcome:  Patient Visit Completed   Suzen L. Ramonita, RN, BSN, [facility]  VBCI Care Management Coordinator  [phone]  Fax: [phone]

## 2023-04-15 NOTE — Patient Instructions (Signed)
 Visit Information  Thank you for taking time to visit with me today. Please don't hesitate to contact me if I can be of assistance to you.   Following are the goals we discussed today:   Goals Addressed             This Visit's Progress    Decrease eating fried foods, continue to do whatever it takes this year to remain healthy.   On track    Interventions Today    Flowsheet Row Most Recent Value  Chronic Disease   Chronic disease during today's visit Congestive Heart Failure (CHF), Hypertension (HTN), Diabetes, Chronic Kidney Disease/End Stage Renal Disease (ESRD)  [confirmed he attended cardiac rehab & now continues to complete the exercise at home with his treadmill, walking,  Dialysis is going fine.having frequently BP checks 140/60 average.  He reports he is eating better (no fried foods) Sees [facility] dietitian]  General Interventions   General Interventions Discussed/Reviewed General Interventions Reviewed, [facility], Doctor Visits  Doctor Visits Discussed/Reviewed Doctor Visits Reviewed, PCP, Specialist  PCP/Specialist Visits Compliance with follow-up visit  Exercise Interventions   Exercise Discussed/Reviewed Exercise Reviewed, Physical Activity, Assistive device use and maintanence  Physical Activity Discussed/Reviewed Physical Activity Reviewed, Gym  Education Interventions   Education Provided Provided Education  [grace period for insurance coverage changes]  Provided Verbal Education On Development Worker, Community, [employer]  Mental Health Interventions   Mental Health Discussed/Reviewed Mental Health Reviewed, Coping Strategies  Nutrition Interventions   Nutrition Discussed/Reviewed Nutrition Reviewed, Decreasing fats  Pharmacy Interventions   Pharmacy Dicussed/Reviewed Pharmacy Topics Reviewed, Medications and their functions, Affording Medications              Our next appointment is by telephone on 06/15/23 at 1045  Please call the care guide team at  [phone] if you need to cancel or reschedule your appointment.   If you are experiencing a Mental Health or Behavioral Health Crisis or need someone to talk to, please call the Suicide and Crisis Lifeline: 988 call the [place]  National Suicide Prevention Lifeline: 7432397117 or TTY: (785)651-6870 TTY 5627308983) to talk to a trained counselor call 1-800-273-TALK (toll free, 24 hour hotline) call the [facility]: 703-547-7785 call 911   Patient verbalizes understanding of instructions and care plan provided today and agrees to view in MyChart. Active MyChart status and patient understanding of how to access instructions and care plan via MyChart confirmed with patient.     The patient has been provided with contact information for the care management team and has been advised to call with any health related questions or concerns.   Makyia Erxleben L. Ramonita, RN, BSN, [facility]  VBCI Care Management Coordinator  [phone]  Fax: [phone]

## 2023-04-16 DIAGNOSIS — N186 End stage renal disease: Secondary | ICD-10-CM | POA: Diagnosis not present

## 2023-04-16 DIAGNOSIS — Z992 Dependence on renal dialysis: Secondary | ICD-10-CM | POA: Diagnosis not present

## 2023-04-19 DIAGNOSIS — N186 End stage renal disease: Secondary | ICD-10-CM | POA: Diagnosis not present

## 2023-04-19 DIAGNOSIS — Z992 Dependence on renal dialysis: Secondary | ICD-10-CM | POA: Diagnosis not present

## 2023-04-20 ENCOUNTER — Encounter (HOSPITAL_COMMUNITY): Payer: Medicare HMO

## 2023-04-21 DIAGNOSIS — Z992 Dependence on renal dialysis: Secondary | ICD-10-CM | POA: Diagnosis not present

## 2023-04-21 DIAGNOSIS — N186 End stage renal disease: Secondary | ICD-10-CM | POA: Diagnosis not present

## 2023-04-22 ENCOUNTER — Encounter (HOSPITAL_COMMUNITY): Payer: Medicare HMO

## 2023-04-23 DIAGNOSIS — Z992 Dependence on renal dialysis: Secondary | ICD-10-CM | POA: Diagnosis not present

## 2023-04-23 DIAGNOSIS — N186 End stage renal disease: Secondary | ICD-10-CM | POA: Diagnosis not present

## 2023-04-26 DIAGNOSIS — E1122 Type 2 diabetes mellitus with diabetic chronic kidney disease: Secondary | ICD-10-CM | POA: Diagnosis not present

## 2023-04-26 DIAGNOSIS — N186 End stage renal disease: Secondary | ICD-10-CM | POA: Diagnosis not present

## 2023-04-26 DIAGNOSIS — Z992 Dependence on renal dialysis: Secondary | ICD-10-CM | POA: Diagnosis not present

## 2023-04-27 ENCOUNTER — Ambulatory Visit: Payer: Medicare HMO | Attending: Internal Medicine | Admitting: Internal Medicine

## 2023-04-27 ENCOUNTER — Encounter (HOSPITAL_COMMUNITY)
Admission: RE | Admit: 2023-04-27 | Discharge: 2023-04-27 | Disposition: A | Discharge status: Home / Self Care | Payer: Medicare HMO | Source: Ambulatory Visit | Attending: Internal Medicine | Admitting: Internal Medicine

## 2023-04-27 VITALS — BP 122/54 | HR 62 | Ht 71.0 in | Wt 196.8 lb

## 2023-04-27 DIAGNOSIS — I255 Ischemic cardiomyopathy: Secondary | ICD-10-CM | POA: Diagnosis not present

## 2023-04-27 DIAGNOSIS — I429 Cardiomyopathy, unspecified: Secondary | ICD-10-CM

## 2023-04-27 DIAGNOSIS — I214 Non-ST elevation (NSTEMI) myocardial infarction: Secondary | ICD-10-CM | POA: Insufficient documentation

## 2023-04-27 DIAGNOSIS — I5022 Chronic systolic (congestive) heart failure: Secondary | ICD-10-CM | POA: Diagnosis not present

## 2023-04-27 DIAGNOSIS — Z955 Presence of coronary angioplasty implant and graft: Secondary | ICD-10-CM | POA: Insufficient documentation

## 2023-04-27 MED ORDER — TICAGRELOR 90 MG PO TABS: 90.0000 mg | ORAL_TABLET | Freq: Two times a day (BID) | ORAL | 3 refills | Status: DC

## 2023-04-27 NOTE — Progress Notes (Signed)
 Cardiology Office Note  Date: 04/27/2023   ID: Trevor [name], DOB December 15, 1940, MRN [id number]  PCP:  Shona Norleen PEDLAR, MD  Cardiologist:  Diannah SHAUNNA Maywood, MD Electrophysiologist:  None   History of Present Illness: Trevor [name] is a 83 y.o. male known to have CAD manifested by NSTEMI in 5/20.  Post LAD PCI, NSTEMI in 10/24 s/p LAD PCI for LAD ISR, chronic systolic heart failure with LVEF 45 to 50% with no device, ischemic cardiomyopathy LVEF 45 to 50%, HTN, DM 2, HLD, ESRD DD is here for follow-up visit.  Overall doing great, no symptoms.  No DOE, orthopnea, PND.  No interval angina.  Active at baseline.  No dizziness, syncope, palpitations or leg swelling.  Compliant with medications.  After his 5/24 NSTEMI, he was taking Brilinta  once a day instead of twice a day and hence had a resultant LAD ISR in 10/24 resulting in LAD PCI.  He is aware of Brilinta  dosing frequency now.  Past Medical History:  Diagnosis Date   Anemia    Chronic kidney disease    Stage 4?    Diabetes mellitus without complication (HCC)    Hepatitis    not sure what type - over 20 years ago   History of kidney stones    Hyperlipidemia    Hypertension     Past Surgical History:  Procedure Laterality Date   AV FISTULA PLACEMENT Left 08/09/2019   Procedure: LEFT ARM Basilic  ARTERIOVENOUS  FISTULA CREATION;  Surgeon: Sheree Penne Bruckner, MD;  Location: [facility] OR;  Service: Vascular;  Laterality: Left;   AV FISTULA PLACEMENT Left 12/17/2020   Procedure: INSERTION OF ARTERIOVENOUS (AV) GORE-TEX GRAFT LEFT ARM;  Surgeon: Sheree Penne Bruckner, MD;  Location: [facility] OR;  Service: Vascular;  Laterality: Left;   BASCILIC VEIN TRANSPOSITION Left 11/30/2019   Procedure: LEFT SECOND STAGE BASCILIC VEIN TRANSPOSITION;  Surgeon: Sheree Penne Bruckner, MD;  Location: [facility] OR;  Service: Vascular;  Laterality: Left;   COLONOSCOPY N/A 07/07/2017   Procedure: COLONOSCOPY;  Surgeon: Harvey Margo CROME, MD;  Location: [facility];  Service: Endoscopy;  Laterality: N/A;  8:30   CORONARY ATHERECTOMY N/A 08/28/2022   Procedure: CORONARY ATHERECTOMY;  Surgeon: Wendel Lurena POUR, MD;  [facility];  Service: Cardiovascular;  Laterality: N/A;   CORONARY IMAGING/OCT N/A 08/28/2022   Procedure: CORONARY IMAGING/OCT;  Surgeon: Wendel Lurena POUR, MD;  [facility];  Service: Cardiovascular;  Laterality: N/A;   CORONARY LITHOTRIPSY N/A 02/02/2023   Procedure: CORONARY LITHOTRIPSY;  Surgeon: Elmira Newman PARAS, MD;  [facility];  Service: Cardiovascular;  Laterality: N/A;   CORONARY PRESSURE/FFR STUDY N/A 08/28/2022   Procedure: CORONARY PRESSURE/FFR STUDY;  Surgeon: Wendel Lurena POUR, MD;  [facility];  Service: Cardiovascular;  Laterality: N/A;   CORONARY STENT INTERVENTION N/A 08/28/2022   Procedure: CORONARY STENT INTERVENTION;  Surgeon: Wendel Lurena POUR, MD;  [facility];  Service: Cardiovascular;  Laterality: N/A;   CORONARY STENT INTERVENTION N/A 02/02/2023   Procedure: CORONARY STENT INTERVENTION;  Surgeon: Elmira Newman PARAS, MD;  [facility];  Service: Cardiovascular;  Laterality: N/A;   CORONARY ULTRASOUND/IVUS N/A 02/02/2023   Procedure: Coronary Ultrasound/IVUS;  Surgeon: Elmira Newman PARAS, MD;  [facility];  Service: Cardiovascular;  Laterality: N/A;   IR FLUORO GUIDE CV LINE RIGHT  09/06/2020   IR US  GUIDE VASC ACCESS RIGHT  09/06/2020  LEFT HEART CATH AND CORONARY ANGIOGRAPHY N/A 08/28/2022   Procedure: LEFT HEART CATH AND CORONARY ANGIOGRAPHY;  Surgeon: Wendel Lurena POUR, MD;  [facility];  Service: Cardiovascular;  Laterality: N/A;   LEFT HEART CATH AND CORONARY ANGIOGRAPHY N/A 02/02/2023   Procedure: LEFT HEART CATH AND CORONARY ANGIOGRAPHY;  Surgeon: Elmira Newman PARAS, MD;  [facility];  Service: Cardiovascular;  Laterality: N/A;   TOTAL HIP ARTHROPLASTY  Right 10/09/2020   Procedure: TOTAL HIP ARTHROPLASTY ANTERIOR APPROACH;  Surgeon: Fidel Rogue, MD;  [facility];  Service: Orthopedics;  Laterality: Right;   TOTAL HIP ARTHROPLASTY Left 07/23/2022   Procedure: TOTAL HIP ARTHROPLASTY ANTERIOR APPROACH;  Surgeon: Fidel Rogue, MD;  [facility] OR;  Service: Orthopedics;  Laterality: Left;    Current Outpatient Medications  Medication Sig Dispense Refill   ACCU-CHEK AVIVA PLUS test strip USE TO TEST BLOOD SUGAR BEFORE BREAKFAST AND AT BEDTIME 200 strip 2   Accu-Chek Softclix Lancets lancets      acetaminophen  (TYLENOL ) 325 MG tablet Take 1-2 tablets (325-650 mg total) by mouth every 6 (six) hours as needed for mild pain (pain score 1-3 or temp > 100.5).     albuterol (VENTOLIN HFA) 108 (90 Base) MCG/ACT inhaler Inhale 2 puffs every 4 hours by inhalation route.     Alcohol Swabs  (B-D SINGLE USE SWABS  REGULAR) PADS      aspirin  EC 81 MG tablet Take 1 tablet (81 mg total) by mouth daily with breakfast. Swallow whole. 30 tablet 12   calcitRIOL  (ROCALTROL ) 0.25 MCG capsule Take 5 capsules (1.25 mcg total) by mouth every Monday, Wednesday, and Friday with hemodialysis. 60 capsule 0   carvedilol  (COREG ) 12.5 MG tablet Take 12.5 mg by mouth 2 (two) times daily with a meal.     Cholecalciferol  (VITAMIN D -3 PO) Take 1 tablet by mouth daily.     CIPRO 250 MG tablet Take 1 tablet every day by oral route for 7 days.     CRANBERRY EXTRACT PO Take 1 tablet by mouth daily.     hydrALAZINE  (APRESOLINE ) 50 MG tablet Take 50 mg by mouth 2 (two) times daily.     isosorbide  mononitrate (IMDUR ) 30 MG 24 hr tablet Take 1 tablet (30 mg total) by mouth daily. 30 tablet 2   lactulose  (CHRONULAC ) 10 GM/15ML solution Take 20 g by mouth daily as needed for moderate constipation.     nitroGLYCERIN  (NITROSTAT ) 0.4 MG SL tablet Place 1 tablet (0.4 mg total) under the tongue every 5 (five) minutes as needed for chest pain. 25 tablet 3   ondansetron  (ZOFRAN ) 8 MG tablet       rosuvastatin  (CRESTOR ) 10 MG tablet Take 10 mg by mouth every evening.     insulin  degludec (TRESIBA FLEXTOUCH) 200 UNIT/ML FlexTouch Pen 10-24 Units as needed (blood sugar). Sliding scale Under 100 Do Not Take (Patient not taking: Reported on 03/04/2023)     ticagrelor  (BRILINTA ) 90 MG TABS tablet Take 1 tablet (90 mg total) by mouth 2 (two) times daily. 180 tablet 3   No current facility-administered medications for this visit.   Allergies:  Patient has no known allergies.   Social History: The patient  reports that he has never smoked. He has never used smokeless tobacco. He reports that he does not drink alcohol and does not use drugs.   Family History: The patient's family history includes Colon cancer in his son; Diabetes in his brother and mother; Heart attack in his brother;  Lung cancer in his son.   ROS:  Please see the history of present illness. Otherwise, complete review of systems is positive for none  All other systems are reviewed and negative.   Physical Exam: VS:  BP (!) 122/54   Pulse 62   Ht 5' 11 (1.803 m)   Wt 196 lb 12.8 oz (89.3 kg)   SpO2 99%   BMI 27.45 kg/m , BMI Body mass index is 27.45 kg/m.  Wt Readings from Last 3 Encounters:  04/27/23 196 lb 12.8 oz (89.3 kg)  03/04/23 195 lb 14.4 oz (88.9 kg)  03/02/23 197 lb (89.4 kg)    General: Patient appears comfortable at rest. HEENT: Conjunctiva and lids normal, oropharynx clear with moist mucosa. Neck: Supple, no elevated JVP or carotid bruits, no thyromegaly. Lungs: Clear to auscultation, nonlabored breathing at rest. Cardiac: Regular rate and rhythm, no S3 or significant systolic murmur, no pericardial rub. Abdomen: Soft, nontender, no hepatomegaly, bowel sounds present, no guarding or rebound. Extremities: No pitting edema, distal pulses 2+. Skin: Warm and dry. Musculoskeletal: No kyphosis. Neuropsychiatric: Alert and oriented x3, affect grossly appropriate.  Recent Labwork: 02/01/2023: ALT  14; AST 35; B Natriuretic Peptide 1,259.0; Magnesium 1.9 02/03/2023: BUN 61; Creatinine, Ser 9.50; Hemoglobin 10.6; Platelets 228; Potassium 4.2; Sodium 131; TSH 1.898     Component Value Date/Time   CHOL 100 02/03/2023 1410   TRIG 55 02/03/2023 1410   HDL 47 02/03/2023 1410   CHOLHDL 2.1 02/03/2023 1410   VLDL 11 02/03/2023 1410   LDLCALC 42 02/03/2023 1410     Assessment and Plan:   CAD manifested by NSTEMI in 5/24 s/p LAD PCI, NSTEMI in 10/24 for LAD ISR s/p LAD PCI, angina free Chronic systolic heart failure, compensated Ischemic cardiomyopathy with LVEF 45 to 50% ESRD DD HTN, DM 2, HLD   -No interval angina and compensated.  Continue DAPT (aspirin  81 mg once daily and Brilinta  90 mg twice daily), rosuvastatin  10 mg nightly.  Goal LDL less than 70, currently at goal.  Will repeat echocardiogram for LVEF assessment.  Otherwise continue current antihypertensives, Imdur  30 mg once daily, hydralazine  50 mg twice daily, carvedilol  12.5 mg twice daily.      Medication Adjustments/Labs and Tests Ordered: Current medicines are reviewed at length with the patient today.  Concerns regarding medicines are outlined above.    Disposition:  Follow up  6 months  Signed Josue Falconi Priya Kamarri Fischetti, MD, 04/27/2023 12:33 PM    [facility] at [facility] [address] [place], [place], [place] [address]

## 2023-04-27 NOTE — Patient Instructions (Signed)

## 2023-04-28 DIAGNOSIS — N186 End stage renal disease: Secondary | ICD-10-CM | POA: Diagnosis not present

## 2023-04-28 DIAGNOSIS — Z992 Dependence on renal dialysis: Secondary | ICD-10-CM | POA: Diagnosis not present

## 2023-04-29 ENCOUNTER — Encounter (HOSPITAL_COMMUNITY): Payer: Medicare HMO

## 2023-04-30 ENCOUNTER — Ambulatory Visit
Admission: EM | Admit: 2023-04-30 | Discharge: 2023-04-30 | Disposition: A | Discharge status: Home / Self Care | Payer: Medicare HMO

## 2023-04-30 DIAGNOSIS — N186 End stage renal disease: Secondary | ICD-10-CM | POA: Diagnosis not present

## 2023-04-30 DIAGNOSIS — Z992 Dependence on renal dialysis: Secondary | ICD-10-CM | POA: Diagnosis not present

## 2023-04-30 DIAGNOSIS — H1132 Conjunctival hemorrhage, left eye: Secondary | ICD-10-CM

## 2023-04-30 NOTE — ED Triage Notes (Signed)
Pt reports his left eye was itching he scratched it and  now it is red and swollen since about 3 hours ago.   Pt eye is blood and swollen

## 2023-04-30 NOTE — Discharge Instructions (Addendum)
Apply warm compresses to the left eye for the next 2--3 days. Apply warm compresses to the left eye every hour for at least 15 minutes over the next 2-3 days. May take OTC Tylenol as needed for pain or discomfort. May use OTC Visine or Clear Eyes eye drops to keep the eye moist and lubricated. Do not rub or manipulate the eye while symptoms persist. Follow-up with your eye doctor on 05/03/23 or 05/04/23 for reevaluation. Go to the emergency department immediately if you lose vision in your eye, have increased pain, swelling or other concerns. Follow-up as needed.

## 2023-04-30 NOTE — ED Provider Notes (Signed)
[facility]    CSN: [id number] Arrival date & time: 04/30/23  1035      History   Chief Complaint No chief complaint on file.   HPI Trevor [name] is a 83 y.o. male.   The history is provided by the patient.   Patient presents for complaints of swelling and redness from the left eye that started about 3 hours ago.  Patient states he was at dialysis and he scratched his eye, he said about an hour later, his symptoms started.  He denies pain, visual changes, light sensitivity, dizziness, fever, or chills.  Patient reports that he does not wear eyeglasses or contact.  States that he is on Brilinta for past medical history of MI.  Patient also is a dialysis patient  Past Medical History:  Diagnosis Date   Anemia    Chronic kidney disease    Stage 4?    Diabetes mellitus without complication (HCC)    Hepatitis    not sure what type - over 20 years ago   History of kidney stones    Hyperlipidemia    Hypertension     Patient Active Problem List   Diagnosis Date Noted   Chronic systolic heart failure (HCC) 04/27/2023   Ischemic cardiomyopathy 04/27/2023   Acute non-ST segment elevation myocardial infarction (HCC) 02/09/2023   First degree heart block 02/09/2023   Hardening of the aorta (main artery of the heart) (HCC) 02/09/2023   Congestive heart failure (CHF) (HCC) 02/01/2023   Elevated brain natriuretic peptide (BNP) level 02/01/2023   NSTEMI (non-ST elevated myocardial infarction) (HCC) 02/01/2023   Abdominal pain 02/01/2023   Chronic kidney disease, stage 5 (HCC) 09/15/2022   CAD S/P percutaneous coronary angioplasty 09/15/2022   Hyperglycemia due to type 2 diabetes mellitus (HCC) 09/15/2022   Hypertensive renal disease 09/15/2022   Acute exacerbation of CHF (congestive heart failure) (HCC) 08/27/2022   Hypertensive emergency 08/27/2022   Atypical chest pain 08/27/2022   Closed left hip fracture (HCC) 07/21/2022   Peripherally inserted central venous  catheter in situ 01/07/2021   Secondary hyperparathyroidism (HCC) 01/07/2021   Hepatitis 01/07/2021   Hypertensive heart disease with end stage renal disease (HCC) 01/07/2021   Fracture of neck of femur (HCC) 11/08/2020   Hip fracture (HCC) 10/08/2020   End-stage renal disease on hemodialysis (HCC) 10/08/2020   Hyponatremia 10/08/2020   Hyperglycemia 10/08/2020   Hip pain 10/08/2020   History of renal dialysis 09/13/2020   Anemia 09/13/2020   Edema 09/13/2020   Essential hypertension 09/13/2020   Gastroesophageal reflux disease 09/13/2020   Mixed hyperlipidemia 09/13/2020   Neuropathy 09/13/2020   Polyp of colon 09/13/2020   Proteinuria 09/13/2020   Latent tuberculosis 08/27/2020   Medication monitoring encounter 08/27/2020   Immunization counseling 08/27/2020   TB lung, latent 07/25/2020   Diabetes mellitus with stage 5 chronic kidney disease (HCC) 05/02/2020   Thyroid nodule 05/02/2020   Chronic kidney disease, stage 4 (severe) (HCC) 05/16/2019   Special screening for malignant neoplasms, colon     Past Surgical History:  Procedure Laterality Date   AV FISTULA PLACEMENT Left 08/09/2019   Procedure: LEFT ARM Basilic  ARTERIOVENOUS  FISTULA CREATION;  Surgeon: Maeola Harman, MD;  Location: [facility] OR;  Service: Vascular;  Laterality: Left;   AV FISTULA PLACEMENT Left 12/17/2020   Procedure: INSERTION OF ARTERIOVENOUS (AV) GORE-TEX GRAFT LEFT ARM;  Surgeon: Maeola Harman, MD;  Location: [facility] OR;  Service: Vascular;  Laterality: Left;   BASCILIC VEIN TRANSPOSITION  Left 11/30/2019   Procedure: LEFT SECOND STAGE BASCILIC VEIN TRANSPOSITION;  Surgeon: Maeola Harman, MD;  Location: [facility] OR;  Service: Vascular;  Laterality: Left;   COLONOSCOPY N/A 07/07/2017   Procedure: COLONOSCOPY;  Surgeon: West Bali, MD;  Location[facility];  Service: Endoscopy;  Laterality: N/A;  8:30   CORONARY ATHERECTOMY N/A 08/28/2022   Procedure: CORONARY ATHERECTOMY;   Surgeon: Orbie Pyo, MD;  [facility];  Service: Cardiovascular;  Laterality: N/A;   CORONARY IMAGING/OCT N/A 08/28/2022   Procedure: CORONARY IMAGING/OCT;  Surgeon: Orbie Pyo, MD;  [facility];  Service: Cardiovascular;  Laterality: N/A;   CORONARY LITHOTRIPSY N/A 02/02/2023   Procedure: CORONARY LITHOTRIPSY;  Surgeon: Elder Negus, MD;  [facility];  Service: Cardiovascular;  Laterality: N/A;   CORONARY PRESSURE/FFR STUDY N/A 08/28/2022   Procedure: CORONARY PRESSURE/FFR STUDY;  Surgeon: Orbie Pyo, MD;  [facility];  Service: Cardiovascular;  Laterality: N/A;   CORONARY STENT INTERVENTION N/A 08/28/2022   Procedure: CORONARY STENT INTERVENTION;  Surgeon: Orbie Pyo, MD;  [facility];  Service: Cardiovascular;  Laterality: N/A;   CORONARY STENT INTERVENTION N/A 02/02/2023   Procedure: CORONARY STENT INTERVENTION;  Surgeon: Elder Negus, MD;  [facility];  Service: Cardiovascular;  Laterality: N/A;   CORONARY ULTRASOUND/IVUS N/A 02/02/2023   Procedure: Coronary Ultrasound/IVUS;  Surgeon: Elder Negus, MD;  [facility];  Service: Cardiovascular;  Laterality: N/A;   IR FLUORO GUIDE CV LINE RIGHT  09/06/2020   IR US GUIDE VASC ACCESS RIGHT  09/06/2020   LEFT HEART CATH AND CORONARY ANGIOGRAPHY N/A 08/28/2022   Procedure: LEFT HEART CATH AND CORONARY ANGIOGRAPHY;  Surgeon: Orbie Pyo, MD;  [facility];  Service: Cardiovascular;  Laterality: N/A;   LEFT HEART CATH AND CORONARY ANGIOGRAPHY N/A 02/02/2023   Procedure: LEFT HEART CATH AND CORONARY ANGIOGRAPHY;  Surgeon: Elder Negus, MD;  [facility];  Service: Cardiovascular;  Laterality: N/A;   TOTAL HIP ARTHROPLASTY Right 10/09/2020   Procedure: TOTAL HIP ARTHROPLASTY ANTERIOR APPROACH;  Surgeon: [name], MD;  [facility];  Service:  Orthopedics;  Laterality: Right;   TOTAL HIP ARTHROPLASTY Left 07/23/2022   Procedure: TOTAL HIP ARTHROPLASTY ANTERIOR APPROACH;  Surgeon: [name], MD;  Location: [facility] OR;  Service: Orthopedics;  Laterality: Left;       Home Medications    Prior to Admission medications   Medication Sig Start Date End Date Taking? Authorizing Provider  ACCU-CHEK AVIVA PLUS test strip USE TO TEST BLOOD SUGAR BEFORE BREAKFAST AND AT BEDTIME 05/26/21   Roma Kayser, MD  Accu-Chek Softclix Lancets lancets  09/13/19   [provider]  acetaminophen (TYLENOL) 325 MG tablet Take 1-2 tablets (325-650 mg total) by mouth every 6 (six) hours as needed for mild pain (pain score 1-3 or temp > 100.5). 07/28/22   Meredeth Ide, MD  albuterol (VENTOLIN HFA) 108 (90 Base) MCG/ACT inhaler Inhale 2 puffs every 4 hours by inhalation route. 02/26/23   [provider]  Alcohol Swabs (B-D SINGLE USE SWABS REGULAR) PADS  08/11/19   [provider]  aspirin EC 81 MG tablet Take 1 tablet (81 mg total) by mouth daily with breakfast. Swallow whole. 08/30/22   Barrett, Joline Salt, PA-C  calcitRIOL (ROCALTROL) 0.25 MCG capsule Take 5 capsules (1.25 mcg total) by mouth every Monday,  Wednesday, and Friday with hemodialysis. 02/03/23   Osvaldo Shipper, MD  carvedilol (COREG) 12.5 MG tablet Take 12.5 mg by mouth 2 (two) times daily with a meal. 10/18/19   [provider]  Cholecalciferol (VITAMIN D-3 PO) Take 1 tablet by mouth daily.    [provider]  CIPRO 250 MG tablet Take 1 tablet every day by oral route for 7 days. 02/09/23   [provider]  CRANBERRY EXTRACT PO Take 1 tablet by mouth daily.    [provider]  hydrALAZINE (APRESOLINE) 50 MG tablet Take 50 mg by mouth 2 (two) times daily.    [provider]  insulin degludec (TRESIBA FLEXTOUCH) 200 UNIT/ML FlexTouch Pen 10-24 Units as needed (blood sugar). Sliding scale Under 100 Do Not Take Patient not  taking: Reported on 03/04/2023    [provider]  isosorbide mononitrate (IMDUR) 30 MG 24 hr tablet Take 1 tablet (30 mg total) by mouth daily. 02/03/23   Osvaldo Shipper, MD  lactulose (CHRONULAC) 10 GM/15ML solution Take 20 g by mouth daily as needed for moderate constipation. 08/21/22   [provider]  nitroGLYCERIN (NITROSTAT) 0.4 MG SL tablet Place 1 tablet (0.4 mg total) under the tongue every 5 (five) minutes as needed for chest pain. 08/29/22   Barrett, Joline Salt, PA-C  ondansetron (ZOFRAN) 8 MG tablet     [provider]  rosuvastatin (CRESTOR) 10 MG tablet Take 10 mg by mouth every evening. 01/26/17   [provider]  ticagrelor (BRILINTA) 90 MG TABS tablet Take 1 tablet (90 mg total) by mouth 2 (two) times daily. 04/27/23   Mallipeddi, Orion Modest, MD    Family History Family History  Problem Relation Age of Onset   Diabetes Mother    Diabetes Brother    Heart attack Brother    Colon cancer Son    Lung cancer Son     Social History Social History   Tobacco Use   Smoking status: Never   Smokeless tobacco: Never  Vaping Use   Vaping status: Never Used  Substance Use Topics   Alcohol use: Never   Drug use: Never     Allergies   Patient has no known allergies.   Review of Systems Review of Systems Per HPI  Physical Exam Triage Vital Signs ED Triage Vitals  Encounter Vitals Group     BP 04/30/23 1125 (!) 148/74     Systolic BP Percentile --      Diastolic BP Percentile --      Pulse Rate 04/30/23 1125 65     Resp 04/30/23 1125 18     Temp 04/30/23 1125 (!) 97.5 F (36.4 C)     Temp Source 04/30/23 1125 Oral     SpO2 04/30/23 1125 96 %     Weight --      Height --      Head Circumference --      Peak Flow --      Pain Score 04/30/23 1126 0     Pain Loc --      Pain Education --      Exclude from Growth Chart --    No data found.  Updated Vital Signs BP (!) 148/74 (BP Location: Right Arm)   Pulse 65   Temp (!) 97.5  F (36.4 C) (Oral)   Resp 18   SpO2 96%   Visual Acuity Right Eye Distance: 20/40 Left Eye Distance: 20/50 Bilateral Distance: 20/40  Right Eye Near:  Left Eye Near:    Bilateral Near:     Physical Exam Vitals and nursing note reviewed.  Constitutional:      General: He is not in acute distress.    Appearance: Normal appearance.  HENT:     Head: Normocephalic.  Eyes:     General: Lids are normal. Vision grossly intact.     Extraocular Movements: Extraocular movements intact.     Right eye: Normal extraocular motion and no nystagmus.     Conjunctiva/sclera:     Right eye: Right conjunctiva is not injected. Hemorrhage present. No chemosis or exudate.    Pupils: Pupils are equal, round, and reactive to light.  Cardiovascular:     Rate and Rhythm: Normal rate and regular rhythm.     Pulses: Normal pulses.     Heart sounds: Normal heart sounds.  Pulmonary:     Effort: Pulmonary effort is normal.     Breath sounds: Normal breath sounds.  Abdominal:     General: Bowel sounds are normal.     Palpations: Abdomen is soft.  Musculoskeletal:     Cervical back: Normal range of motion.  Skin:    General: Skin is warm and dry.  Neurological:     General: No focal deficit present.     Mental Status: He is alert and oriented to person, place, and time.  Psychiatric:        Mood and Affect: Mood normal.        Behavior: Behavior normal.      UC Treatments / Results  Labs (all labs ordered are listed, but only abnormal results are displayed) Labs Reviewed - No data to display  EKG   Radiology No results found.  Procedures Procedures (including critical care time)  Medications Ordered in UC Medications - No data to display  Initial Impression / Assessment and Plan / UC Course  I have reviewed the triage vital signs and the nursing notes.  Pertinent labs & imaging results that were available during my care of the patient were reviewed by me and considered in my  medical decision making (see chart for details).  On exam, lung sounds are clear throughout, room air sats at 96%.  Patient with moderate symptoms subconjunctival hemorrhage.  No reported change in in patient's vision, although he does have some decreased vision in the left eye on visual acuity.  Patient is on Brilinta, when he rubbed the eye, symptoms most likely were triggered.  Advised patient that the bleeding in his eye will need to reabsorb over time.  Supportive care recommendations were provided and discussed with the patient to include warm compresses, over-the-counter Clear Eyes or Visine, and avoiding rubbing or manipulating the eye.  Advised patient to follow-up with his ophthalmologist on 1/20 or 1/21.  Discussed and provided ER follow-up indications with the patient.  Patient was in agreement with this plan of care, and verbalized understanding.  All questions were answered.  Patient stable for discharge.  Final Clinical Impressions(s) / UC Diagnoses   Final diagnoses:  Subconjunctival hemorrhage of left eye     Discharge Instructions      Apply warm compresses to the left eye for the next 2--3 days. Apply warm compresses to the left eye every hour for at least 15 minutes over the next 2-3 days. May take OTC Tylenol as needed for pain or discomfort. May use OTC Visine or Clear Eyes eye drops to keep the eye moist and lubricated. Do not rub or manipulate  the eye while symptoms persist. Follow-up with your eye doctor on 05/03/23 or 05/04/23 for reevaluation. Go to the emergency department immediately if you lose vision in your eye, have increased pain, swelling or other concerns. Follow-up as needed.      ED Prescriptions   None    PDMP not reviewed this encounter.   Abran Cantor, NP 04/30/23 1205

## 2023-05-03 DIAGNOSIS — N186 End stage renal disease: Secondary | ICD-10-CM | POA: Diagnosis not present

## 2023-05-03 DIAGNOSIS — Z992 Dependence on renal dialysis: Secondary | ICD-10-CM | POA: Diagnosis not present

## 2023-05-04 ENCOUNTER — Encounter (HOSPITAL_COMMUNITY): Payer: Medicare HMO

## 2023-05-04 ENCOUNTER — Ambulatory Visit: Payer: Medicare HMO | Attending: Internal Medicine

## 2023-05-04 DIAGNOSIS — S0502XA Injury of conjunctiva and corneal abrasion without foreign body, left eye, initial encounter: Secondary | ICD-10-CM | POA: Diagnosis not present

## 2023-05-04 DIAGNOSIS — I429 Cardiomyopathy, unspecified: Secondary | ICD-10-CM | POA: Diagnosis not present

## 2023-05-05 DIAGNOSIS — Z992 Dependence on renal dialysis: Secondary | ICD-10-CM | POA: Diagnosis not present

## 2023-05-05 DIAGNOSIS — N186 End stage renal disease: Secondary | ICD-10-CM | POA: Diagnosis not present

## 2023-05-05 LAB — ECHOCARDIOGRAM COMPLETE
AR max vel: 2.92 cm2
AV Area VTI: 3.02 cm2
AV Area mean vel: 2.86 cm2
AV Mean grad: 3 mm[Hg]
AV Peak grad: 6.4 mm[Hg]
Ao pk vel: 1.26 m/s
Area-P 1/2: 4.71 cm2
Calc EF: 56.3 %
MV VTI: 2.78 cm2
S' Lateral: 3.3 cm
Single Plane A2C EF: 56.4 %
Single Plane A4C EF: 57.5 %

## 2023-05-06 ENCOUNTER — Encounter (HOSPITAL_COMMUNITY): Payer: Medicare HMO

## 2023-05-07 ENCOUNTER — Telehealth: Payer: Self-pay

## 2023-05-07 DIAGNOSIS — Z992 Dependence on renal dialysis: Secondary | ICD-10-CM | POA: Diagnosis not present

## 2023-05-07 DIAGNOSIS — N186 End stage renal disease: Secondary | ICD-10-CM | POA: Diagnosis not present

## 2023-05-07 NOTE — Telephone Encounter (Signed)
-----   Message from Vishnu P Mallipeddi sent at 05/06/2023  1:02 PM EST ----- Heart pumping function is 50 to 55% (previously 45 to 50%, improved), indeterminate diastology but elevated left atrial pressure, CVP 3 mmHg and no evidence of valvular heart disease.  No changes to the plan.  Continue current medications.  He does have severely elevated PASP (increased lung pressures), currently asymptomatic, will monitor.

## 2023-05-07 NOTE — Telephone Encounter (Signed)
Patient informed and verbalized understanding of plan.

## 2023-05-10 DIAGNOSIS — Z992 Dependence on renal dialysis: Secondary | ICD-10-CM | POA: Diagnosis not present

## 2023-05-10 DIAGNOSIS — N186 End stage renal disease: Secondary | ICD-10-CM | POA: Diagnosis not present

## 2023-05-11 ENCOUNTER — Encounter (HOSPITAL_COMMUNITY): Payer: Medicare HMO

## 2023-05-12 ENCOUNTER — Encounter (HOSPITAL_COMMUNITY): Payer: Self-pay | Admitting: *Deleted

## 2023-05-12 DIAGNOSIS — N186 End stage renal disease: Secondary | ICD-10-CM | POA: Diagnosis not present

## 2023-05-12 DIAGNOSIS — Z955 Presence of coronary angioplasty implant and graft: Secondary | ICD-10-CM

## 2023-05-12 DIAGNOSIS — Z992 Dependence on renal dialysis: Secondary | ICD-10-CM | POA: Diagnosis not present

## 2023-05-12 DIAGNOSIS — I214 Non-ST elevation (NSTEMI) myocardial infarction: Secondary | ICD-10-CM

## 2023-05-12 NOTE — Progress Notes (Signed)
Cardiac Individual Treatment Plan  Patient Details  Name: Trevor [name] MRN: [id number] Date of Birth: February 18, 1941 Referring Provider:   Flowsheet Row CARDIAC REHAB PHASE II ORIENTATION from 03/04/2023 in [facility] CARDIAC REHABILITATION  Referring Provider Richardo Priest MD  [facility] Cardiologist Dr.Vishnu Mallipeddi]       Initial Encounter Date:  Flowsheet Row CARDIAC REHAB PHASE II ORIENTATION from 03/04/2023 in [place] CARDIAC REHABILITATION  Date 03/04/23       Visit Diagnosis: NSTEMI (non-ST elevated myocardial infarction) [facility])  Status post coronary artery stent placement  Patient's Home Medications on Admission:  Current Outpatient Medications:    ACCU-CHEK AVIVA PLUS test strip, USE TO TEST BLOOD SUGAR BEFORE BREAKFAST AND AT BEDTIME, Disp: 200 strip, Rfl: 2   Accu-Chek Softclix Lancets lancets, , Disp: , Rfl:    acetaminophen (TYLENOL) 325 MG tablet, Take 1-2 tablets (325-650 mg total) by mouth every 6 (six) hours as needed for mild pain (pain score 1-3 or temp > 100.5)., Disp: , Rfl:    albuterol (VENTOLIN HFA) 108 (90 Base) MCG/ACT inhaler, Inhale 2 puffs every 4 hours by inhalation route., Disp: , Rfl:    Alcohol Swabs (B-D SINGLE USE SWABS REGULAR) PADS, , Disp: , Rfl:    aspirin EC 81 MG tablet, Take 1 tablet (81 mg total) by mouth daily with breakfast. Swallow whole., Disp: 30 tablet, Rfl: 12   calcitRIOL (ROCALTROL) 0.25 MCG capsule, Take 5 capsules (1.25 mcg total) by mouth every Monday, Wednesday, and Friday with hemodialysis., Disp: 60 capsule, Rfl: 0   carvedilol (COREG) 12.5 MG tablet, Take 12.5 mg by mouth 2 (two) times daily with a meal., Disp: , Rfl:    Cholecalciferol (VITAMIN D-3 PO), Take 1 tablet by mouth daily., Disp: , Rfl:    CIPRO 250 MG tablet, Take 1 tablet every day by oral route for 7 days., Disp: , Rfl:    CRANBERRY EXTRACT PO, Take 1 tablet by mouth daily., Disp: , Rfl:    hydrALAZINE (APRESOLINE) 50 MG tablet, Take 50 mg by mouth 2  (two) times daily., Disp: , Rfl:    insulin degludec (TRESIBA FLEXTOUCH) 200 UNIT/ML FlexTouch Pen, 10-24 Units as needed (blood sugar). Sliding scale Under 100 Do Not Take (Patient not taking: Reported on 03/04/2023), Disp: , Rfl:    isosorbide mononitrate (IMDUR) 30 MG 24 hr tablet, Take 1 tablet (30 mg total) by mouth daily., Disp: 30 tablet, Rfl: 2   lactulose (CHRONULAC) 10 GM/15ML solution, Take 20 g by mouth daily as needed for moderate constipation., Disp: , Rfl:    nitroGLYCERIN (NITROSTAT) 0.4 MG SL tablet, Place 1 tablet (0.4 mg total) under the tongue every 5 (five) minutes as needed for chest pain., Disp: 25 tablet, Rfl: 3   ondansetron (ZOFRAN) 8 MG tablet, , Disp: , Rfl:    rosuvastatin (CRESTOR) 10 MG tablet, Take 10 mg by mouth every evening., Disp: , Rfl:    ticagrelor (BRILINTA) 90 MG TABS tablet, Take 1 tablet (90 mg total) by mouth 2 (two) times daily., Disp: 180 tablet, Rfl: 3  Past Medical History: Past Medical History:  Diagnosis Date   Anemia    Chronic kidney disease    Stage 4?    Diabetes mellitus without complication (HCC)    Hepatitis    not sure what type - over 20 years ago   History of kidney stones    Hyperlipidemia    Hypertension     Tobacco Use: Social History   Tobacco Use  Smoking Status Never  Smokeless Tobacco Never    Labs: Review Flowsheet  More data exists      Latest Ref Rng & Units 12/17/2020 07/22/2022 07/23/2022 02/01/2023 02/03/2023  Labs for ITP Cardiac and Pulmonary Rehab  Cholestrol 0 - 200 mg/dL - - - - 161   LDL (calc) 0 - 99 mg/dL - - - - 42   HDL-C >09 mg/dL - - - - 47   Trlycerides <150 mg/dL - - - - 55   Hemoglobin A1c 4.8 - 5.6 % - 5.7  - 5.3  -  TCO2 22 - 32 mmol/L 27  - 28  - -    Capillary Blood Glucose: Lab Results  Component Value Date   GLUCAP 90 03/09/2023   GLUCAP 96 03/09/2023   GLUCAP 174 (H) 02/03/2023   GLUCAP 158 (H) 02/03/2023   GLUCAP 164 (H) 02/03/2023     Exercise Target Goals: Exercise  Program Goal: Individual exercise prescription set using results from initial 6 min walk test and THRR while considering  patient's activity barriers and safety.   Exercise Prescription Goal: Starting with aerobic activity 30 plus minutes a day, 3 days per week for initial exercise prescription. Provide home exercise prescription and guidelines that participant acknowledges understanding prior to discharge.  Activity Barriers & Risk Stratification:  Activity Barriers & Cardiac Risk Stratification - 03/04/23 1344       Activity Barriers & Cardiac Risk Stratification   Activity Barriers Left Hip Replacement;Right Hip Replacement;History of Falls;Deconditioning;Balance Concerns;Muscular Weakness;Shortness of Breath    Cardiac Risk Stratification High             6 Minute Walk:  6 Minute Walk     Row Name 03/04/23 1554         6 Minute Walk   Phase Initial     Distance 880 feet     Walk Time 6 minutes     # of Rest Breaks 0     MPH 1.66     METS 1.78     RPE 11     Perceived Dyspnea  1     VO2 Peak 6.24     Symptoms Yes (comment)     Comments release in chest tightness, slightly SOB     Resting HR 78 bpm     Resting BP 116/62     Resting Oxygen Saturation  98 %     Exercise Oxygen Saturation  during 6 min walk 95 %     Max Ex. HR 92 bpm     Max Ex. BP 172/84     2 Minute Post BP 142/74              Oxygen Initial Assessment:   Oxygen Re-Evaluation:   Oxygen Discharge (Final Oxygen Re-Evaluation):   Initial Exercise Prescription:  Initial Exercise Prescription - 03/04/23 1500       Date of Initial Exercise RX and Referring Provider   Date 03/04/23    Referring Provider Richardo Priest MD   Primary Cardiologist Dr.Vishnu Mallipeddi     Oxygen   Maintain Oxygen Saturation 88% or higher      Treadmill   MPH 1.2    Grade 0.5    Minutes 15    METs 2      NuStep   Level 2    SPM 80    Minutes 15    METs 2      Prescription Details   Frequency  (times per week)  2    Duration Progress to 30 minutes of continuous aerobic without signs/symptoms of physical distress      Intensity   THRR 40-80% of Max Heartrate 102-127    Ratings of Perceived Exertion 11-13    Perceived Dyspnea 0-4      Progression   Progression Continue to progress workloads to maintain intensity without signs/symptoms of physical distress.      Resistance Training   Training Prescription Yes    Weight 3 lb    Reps 10-15             Perform Capillary Blood Glucose checks as needed.  Exercise Prescription Changes:   Exercise Prescription Changes     Row Name 03/04/23 1500 03/09/23 1500 03/25/23 1400 04/01/23 1500       Response to Exercise   Blood Pressure (Admit) 116/62 122/52 142/52 122/50    Blood Pressure (Exercise) 172/84 132/62 148/68 --    Blood Pressure (Exit) 142/74 120/60 138/68 128/58    Heart Rate (Admit) 78 bpm 57 bpm 64 bpm 65 bpm    Heart Rate (Exercise) 92 bpm 93 bpm 96 bpm 120 bpm    Heart Rate (Exit) 74 bpm 75 bpm 86 bpm 62 bpm    Oxygen Saturation (Admit) 98 % -- -- --    Oxygen Saturation (Exercise) 95 % -- -- --    Rating of Perceived Exertion (Exercise) 11 12 12 12     Perceived Dyspnea (Exercise) 1 -- -- 1    Symptoms chest tightness released, Slightly SOB -- -- --    Comments walk test results -- -- --    Duration -- Continue with 30 min of aerobic exercise without signs/symptoms of physical distress. Continue with 30 min of aerobic exercise without signs/symptoms of physical distress. Continue with 30 min of aerobic exercise without signs/symptoms of physical distress.    Intensity -- THRR unchanged THRR unchanged THRR unchanged      Progression   Progression -- Continue to progress workloads to maintain intensity without signs/symptoms of physical distress. Continue to progress workloads to maintain intensity without signs/symptoms of physical distress. Continue to progress workloads to maintain intensity without  signs/symptoms of physical distress.      Resistance Training   Training Prescription -- Yes Yes Yes    Weight -- 3 lbs 3 lbs 4 lbs    Reps -- 10-15 10-15 10-15      Oxygen   Oxygen -- Continuous -- --      Treadmill   MPH -- 1.2 -- --    Grade -- 0.5 -- --    Minutes -- 15 -- --    METs -- 2 -- --      NuStep   Level -- 1 4 4     SPM -- 60 59 60    Minutes -- 15 15 15     METs -- 1.5 1.9 1.95      Track   Laps -- 20 18 22     Minutes -- -- 15 15    METs -- -- 1.7 2      Oxygen   Maintain Oxygen Saturation -- 88% or higher 88% or higher 88% or higher             Exercise Comments:   Exercise Goals and Review:   Exercise Goals     Row Name 03/04/23 1602             Exercise Goals   Increase Physical Activity Yes  Intervention Provide advice, education, support and counseling about physical activity/exercise needs.;Develop an individualized exercise prescription for aerobic and resistive training based on initial evaluation findings, risk stratification, comorbidities and participant's personal goals.       Expected Outcomes Short Term: Attend rehab on a regular basis to increase amount of physical activity.;Long Term: Add in home exercise to make exercise part of routine and to increase amount of physical activity.;Long Term: Exercising regularly at least 3-5 days a week.       Increase Strength and Stamina Yes       Intervention Provide advice, education, support and counseling about physical activity/exercise needs.;Develop an individualized exercise prescription for aerobic and resistive training based on initial evaluation findings, risk stratification, comorbidities and participant's personal goals.       Expected Outcomes Short Term: Increase workloads from initial exercise prescription for resistance, speed, and METs.;Short Term: Perform resistance training exercises routinely during rehab and add in resistance training at home;Long Term: Improve  cardiorespiratory fitness, muscular endurance and strength as measured by increased METs and functional capacity ( )       Able to understand and use rate of perceived exertion (RPE) scale Yes       Intervention Provide education and explanation on how to use RPE scale       Expected Outcomes Short Term: Able to use RPE daily in rehab to express subjective intensity level;Long Term:  Able to use RPE to guide intensity level when exercising independently       Able to understand and use Dyspnea scale Yes       Intervention Provide education and explanation on how to use Dyspnea scale       Expected Outcomes Short Term: Able to use Dyspnea scale daily in rehab to express subjective sense of shortness of breath during exertion;Long Term: Able to use Dyspnea scale to guide intensity level when exercising independently       Knowledge and understanding of Target Heart Rate Range (THRR) Yes       Intervention Provide education and explanation of THRR including how the numbers were predicted and where they are located for reference       Expected Outcomes Short Term: Able to state/look up THRR;Long Term: Able to use THRR to govern intensity when exercising independently;Short Term: Able to use daily as guideline for intensity in rehab       Able to check pulse independently Yes       Intervention Provide education and demonstration on how to check pulse in carotid and radial arteries.;Review the importance of being able to check your own pulse for safety during independent exercise       Expected Outcomes Short Term: Able to explain why pulse checking is important during independent exercise;Long Term: Able to check pulse independently and accurately       Understanding of Exercise Prescription Yes       Intervention Provide education, explanation, and written materials on patient's individual exercise prescription       Expected Outcomes Short Term: Able to explain program exercise prescription;Long  Term: Able to explain home exercise prescription to exercise independently                Exercise Goals Re-Evaluation :  Exercise Goals Re-Evaluation     Row Name 03/30/23 1456 04/09/23 0801           Exercise Goal Re-Evaluation   Exercise Goals Review Increase Physical Activity;Increase Strength and Stamina;Understanding of Exercise Prescription Increase Physical  Activity;Increase Strength and Stamina;Understanding of Exercise Prescription      Comments Jeyson is doing well in rehab. He has been coming in late missing the warm up, today i reminded him of the time we start. He has noticed an increase in his endurance and his SOB decreaing when doing activies. He is walking at homw in the evenings. Aleck is doing well in rehab and is tolerating exercise okay. He is walking the track due to not liking the treadmill. He has been coming in late to rehab and will miss all the warmup some. Will continue to monitor and progressed as able,      Expected Outcomes Short: keep exercising in at rehab   long term: continue to exericse at home and rehab Short: keep coming to rehab on time and regular basis   long term: continue to attend rehab                Discharge Exercise Prescription (Final Exercise Prescription Changes):  Exercise Prescription Changes - 04/01/23 1500       Response to Exercise   Blood Pressure (Admit) 122/50    Blood Pressure (Exit) 128/58    Heart Rate (Admit) 65 bpm    Heart Rate (Exercise) 120 bpm    Heart Rate (Exit) 62 bpm    Rating of Perceived Exertion (Exercise) 12    Perceived Dyspnea (Exercise) 1    Duration Continue with 30 min of aerobic exercise without signs/symptoms of physical distress.    Intensity THRR unchanged      Progression   Progression Continue to progress workloads to maintain intensity without signs/symptoms of physical distress.      Resistance Training   Training Prescription Yes    Weight 4 lbs    Reps 10-15      NuStep    Level 4    SPM 60    Minutes 15    METs 1.95      Track   Laps 22    Minutes 15    METs 2      Oxygen   Maintain Oxygen Saturation 88% or higher             Nutrition:  Target Goals: Understanding of nutrition guidelines, daily intake of sodium 1500mg , cholesterol 200mg , calories 30% from fat and 7% or less from saturated fats, daily to have 5 or more servings of fruits and vegetables.  Biometrics:  Pre Biometrics - 03/04/23 1602       Pre Biometrics   Height 5\' 10"  (1.778 m)    Weight 195 lb 14.4 oz (88.9 kg)    Waist Circumference 37 inches    Hip Circumference 41 inches    Waist to Hip Ratio 0.9 %    BMI (Calculated) 28.11    Grip Strength 18.6 kg    Single Leg Stand 4.9 seconds              Nutrition Therapy Plan and Nutrition Goals:  Nutrition Therapy & Goals - 03/04/23 1611       Intervention Plan   Intervention Prescribe, educate and counsel regarding individualized specific dietary modifications aiming towards targeted core components such as weight, hypertension, lipid management, diabetes, heart failure and other comorbidities.;Nutrition handout(s) given to patient.    Expected Outcomes Short Term Goal: Understand basic principles of dietary content, such as calories, fat, sodium, cholesterol and nutrients.;Long Term Goal: Adherence to prescribed nutrition plan.  Nutrition Assessments:  MEDIFICTS Score Key: >=70 Need to make dietary changes  40-70 Heart Healthy Diet <= 40 Therapeutic Level Cholesterol Diet  Flowsheet Row CARDIAC REHAB PHASE II ORIENTATION from 03/04/2023 in [facility] CARDIAC REHABILITATION  Picture Your Plate Total Score on Admission 74      Picture Your Plate Scores: <62 Unhealthy dietary pattern with much room for improvement. 41-50 Dietary pattern unlikely to meet recommendations for good health and room for improvement. 51-60 More healthful dietary pattern, with some room for improvement.  >60  Healthy dietary pattern, although there may be some specific behaviors that could be improved.    Nutrition Goals Re-Evaluation:  Nutrition Goals Re-Evaluation     Row Name 03/30/23 1501             Goals   Nutrition Goal healthy eating       Comment Edwing is doing well in rehab. He is eating healthy at home. He said that he eats mostly chicken, [place] and fish. He aslo said that he eats collard greens. He only drinks mostly water and does not really drink soda and tea.       Expected Outcome Short: continue to pick healthy options and drink more water   long term: cotninuet with healthy eating                Nutrition Goals Discharge (Final Nutrition Goals Re-Evaluation):  Nutrition Goals Re-Evaluation - 03/30/23 1501       Goals   Nutrition Goal healthy eating    Comment Talin is doing well in rehab. He is eating healthy at home. He said that he eats mostly chicken, [place] and fish. He aslo said that he eats collard greens. He only drinks mostly water and does not really drink soda and tea.    Expected Outcome Short: continue to pick healthy options and drink more water   long term: cotninuet with healthy eating             Psychosocial: Target Goals: Acknowledge presence or absence of significant depression and/or stress, maximize coping skills, provide positive support system. Participant is able to verbalize types and ability to use techniques and skills needed for reducing stress and depression.  Initial Review & Psychosocial Screening:  Initial Psych Review & Screening - 03/04/23 1355       Initial Review   Current issues with Current Stress Concerns;Current Sleep Concerns    Source of Stress Concerns Unable to participate in former interests or hobbies;Unable to perform yard/household activities;Chronic Illness;Family    Comments Patient's wife lung cancer survivor, and son two strokes. Not sleeping well recently (tried melatonin to help) from feeling like he  is SOB when he lays down      Family Dynamics   Good Support System? Yes   wife, sister     Barriers   Psychosocial barriers to participate in program The patient should benefit from training in stress management and relaxation.;Psychosocial barriers identified (see note)      Screening Interventions   Interventions Encouraged to exercise;To provide support and resources with identified psychosocial needs;Provide feedback about the scores to participant    Expected Outcomes Short Term goal: Utilizing psychosocial counselor, staff and physician to assist with identification of specific Stressors or current issues interfering with healing process. Setting desired goal for each stressor or current issue identified.;Long Term Goal: Stressors or current issues are controlled or eliminated.;Short Term goal: Identification and review with participant of any Quality of Life or Depression  concerns found by scoring the questionnaire.;Long Term goal: The participant improves quality of Life and PHQ9 Scores as seen by post scores and/or verbalization of changes             Quality of Life Scores:  Quality of Life - 03/04/23 1611       Quality of Life   Select Quality of Life      Quality of Life Scores   Health/Function Pre 24 %    Socioeconomic Pre 24.86 %    Psych/Spiritual Pre 26.57 %    Family Pre 25.2 %    GLOBAL Pre 24.88 %            Scores of 19 and below usually indicate a poorer quality of life in these areas.  A difference of  2-3 points is a clinically meaningful difference.  A difference of 2-3 points in the total score of the Quality of Life Index has been associated with significant improvement in overall quality of life, self-image, physical symptoms, and general health in studies assessing change in quality of life.  PHQ-9: Review Flowsheet       03/04/2023 02/11/2023  Depression screen PHQ 2/9  Decreased Interest 0 0  Down, Depressed, Hopeless 1 1  PHQ - 2 Score 1  1  Altered sleeping 3 -  Tired, decreased energy 0 -  Change in appetite 0 -  Feeling bad or failure about yourself  1 -  Trouble concentrating 1 -  Moving slowly or fidgety/restless 1 -  Suicidal thoughts 0 -  PHQ-9 Score 7 -  Difficult doing work/chores Very difficult -   Interpretation of Total Score  Total Score Depression Severity:  1-4 = Minimal depression, 5-9 = Mild depression, 10-14 = Moderate depression, 15-19 = Moderately severe depression, 20-27 = Severe depression   Psychosocial Evaluation and Intervention:  Psychosocial Evaluation - 03/04/23 1607       Psychosocial Evaluation & Interventions   Interventions Stress management education;Encouraged to exercise with the program and follow exercise prescription    Comments Madeline is coming into cardiac rehab after a NSTEMI and second stent.  He was not able to start last month due to the MI.  Since last review he is having a hard time sleeping as he feels short of breath any time he lays down.  He is still on dialysis MWF and will attend program on TTh.  He is looking forwad to program and hopes it will help with his breathing and getting back his stamina.  He wants to improve his heart health and build up his strength and stamina. He now covered at 100%.    Expected Outcomes Short term: Start and attend the program consistently. Long Term: Build up stamina to be able to go and do again    Continue Psychosocial Services  Follow up required by staff             Psychosocial Re-Evaluation:  Psychosocial Re-Evaluation     Row Name 03/30/23 1459             Psychosocial Re-Evaluation   Current issues with None Identified       Comments Chanel is doing well in rehab. He is going to dyalisis throughout the week. He stated that he has no sleep issues other then gettiung up 2xs a noight to use the restroom. He also stated that he has not stress concerns.       Expected Outcomes Short: continue to have no barriers long  term: continue to exerciuse to help overall well being       Interventions Encouraged to attend Cardiac Rehabilitation for the exercise       Continue Psychosocial Services  Follow up required by staff                Psychosocial Discharge (Final Psychosocial Re-Evaluation):  Psychosocial Re-Evaluation - 03/30/23 1459       Psychosocial Re-Evaluation   Current issues with None Identified    Comments Needham is doing well in rehab. He is going to dyalisis throughout the week. He stated that he has no sleep issues other then gettiung up 2xs a noight to use the restroom. He also stated that he has not stress concerns.    Expected Outcomes Short: continue to have no barriers long term: continue to exerciuse to help overall well being    Interventions Encouraged to attend Cardiac Rehabilitation for the exercise    Continue Psychosocial Services  Follow up required by staff             Vocational Rehabilitation: Provide vocational rehab assistance to qualifying candidates.   Vocational Rehab Evaluation & Intervention:  Vocational Rehab - 03/04/23 1358       Initial Vocational Rehab Evaluation & Intervention   Assessment shows need for Vocational Rehabilitation No   retired            Education: Education Goals: Education classes will be provided on a weekly basis, covering required topics. Participant will state understanding/return demonstration of topics presented.  Learning Barriers/Preferences:  Learning Barriers/Preferences - 03/04/23 1357       Learning Barriers/Preferences   Learning Barriers Sight   glasses for reading   Learning Preferences Written Material             Education Topics: Hypertension, Hypertension Reduction -Define heart disease and high blood pressure. Discus how high blood pressure affects the body and ways to reduce high blood pressure.   Exercise and Your Heart -Discuss why it is important to exercise, the FITT principles of  exercise, normal and abnormal responses to exercise, and how to exercise safely. Flowsheet Row CARDIAC REHAB PHASE II EXERCISE from 04/01/2023 in [place] CARDIAC REHABILITATION  Date 03/25/23  Educator hb  Instruction Review Code 1- Verbalizes Understanding       Angina -Discuss definition of angina, causes of angina, treatment of angina, and how to decrease risk of having angina.   Cardiac Medications -Review what the following cardiac medications are used for, how they affect the body, and side effects that may occur when taking the medications.  Medications include Aspirin, Beta blockers, calcium channel blockers, ACE Inhibitors, angiotensin receptor blockers, diuretics, digoxin, and antihyperlipidemics.   Congestive Heart Failure -Discuss the definition of CHF, how to live with CHF, the signs and symptoms of CHF, and how keep track of weight and sodium intake.   Heart Disease and Intimacy -Discus the effect sexual activity has on the heart, how changes occur during intimacy as we age, and safety during sexual activity. Flowsheet Row CARDIAC REHAB PHASE II EXERCISE from 04/01/2023 in [place] [facility]  Date 03/18/23  Educator HB  Instruction Review Code 1- Verbalizes Understanding       Smoking Cessation / COPD -Discuss different methods to quit smoking, the health benefits of quitting smoking, and the definition of COPD.   Nutrition I: Fats -Discuss the types of cholesterol, what cholesterol does to the heart, and how cholesterol levels can be controlled. Flowsheet Row  CARDIAC REHAB PHASE II EXERCISE from 04/01/2023 in [facility]  Date 04/01/23  Educator HB  Instruction Review Code 1- Verbalizes Understanding       Nutrition II: Labels -Discuss the different components of food labels and how to read food label Flowsheet Row CARDIAC REHAB PHASE II EXERCISE from 04/01/2023 in [facility]  Date 04/01/23   Educator HB  Instruction Review Code 1- Verbalizes Understanding       Heart Parts/Heart Disease and PAD -Discuss the anatomy of the heart, the pathway of blood circulation through the heart, and these are affected by heart disease.   Stress I: Signs and Symptoms -Discuss the causes of stress, how stress may lead to anxiety and depression, and ways to limit stress.   Stress II: Relaxation -Discuss different types of relaxation techniques to limit stress.   Warning Signs of Stroke / TIA -Discuss definition of a stroke, what the signs and symptoms are of a stroke, and how to identify when someone is having stroke.   Knowledge Questionnaire Score:  Knowledge Questionnaire Score - 03/04/23 1615       Knowledge Questionnaire Score   Pre Score 15/24             Core Components/Risk Factors/Patient Goals at Admission:  Personal Goals and Risk Factors at Admission - 03/04/23 1615       Core Components/Risk Factors/Patient Goals on Admission    Weight Management Yes;Weight Loss    Intervention Weight Management: Develop a combined nutrition and exercise program designed to reach desired caloric intake, while maintaining appropriate intake of nutrient and fiber, sodium and fats, and appropriate energy expenditure required for the weight goal.;Weight Management: Provide education and appropriate resources to help participant work on and attain dietary goals.;Weight Management/Obesity: Establish reasonable short term and long term weight goals.    Admit Weight 195 lb 14.4 oz (88.9 kg)    Goal Weight: Short Term 190 lb (86.2 kg)    Goal Weight: Long Term 190 lb (86.2 kg)    Expected Outcomes Long Term: Adherence to nutrition and physical activity/exercise program aimed toward attainment of established weight goal;Short Term: Continue to assess and modify interventions until short term weight is achieved;Weight Maintenance: Understanding of the daily nutrition guidelines, which  includes 25-35% calories from fat, 7% or less cal from saturated fats, less than 200mg  cholesterol, less than 1.5gm of sodium, & 5 or more servings of fruits and vegetables daily;Weight Loss: Understanding of general recommendations for a balanced deficit meal plan, which promotes 1-2 lb weight loss per week and includes a negative energy balance of [phone] kcal/d;Understanding recommendations for meals to include 15-35% energy as protein, 25-35% energy from fat, 35-60% energy from carbohydrates, less than 200mg  of dietary cholesterol, 20-35 gm of total fiber daily;Understanding of distribution of calorie intake throughout the day with the consumption of 4-5 meals/snacks    Improve shortness of breath with ADL's Yes    Intervention Provide education, individualized exercise plan and daily activity instruction to help decrease symptoms of SOB with activities of daily living.    Expected Outcomes Short Term: Improve cardiorespiratory fitness to achieve a reduction of symptoms when performing ADLs;Long Term: Be able to perform more ADLs without symptoms or delay the onset of symptoms    Diabetes Yes   A1c now 5.3   Intervention Provide education about signs/symptoms and action to take for hypo/hyperglycemia.;Provide education about proper nutrition, including hydration, and aerobic/resistive exercise prescription along with prescribed medications to achieve  blood glucose in normal ranges: Fasting glucose 65-99 mg/dL    Expected Outcomes Long Term: Attainment of HbA1C < 7%.;Short Term: Participant verbalizes understanding of the signs/symptoms and immediate care of hyper/hypoglycemia, proper foot care and importance of medication, aerobic/resistive exercise and nutrition plan for blood glucose control.    Heart Failure Yes    Intervention Provide a combined exercise and nutrition program that is supplemented with education, support and counseling about heart failure. Directed toward relieving symptoms such as  shortness of breath, decreased exercise tolerance, and extremity edema.    Expected Outcomes Improve functional capacity of life;Short term: Attendance in program 2-3 days a week with increased exercise capacity. Reported lower sodium intake. Reported increased fruit and vegetable intake. Reports medication compliance.;Short term: Daily weights obtained and reported for increase. Utilizing diuretic protocols set by physician.;Long term: Adoption of self-care skills and reduction of barriers for early signs and symptoms recognition and intervention leading to self-care maintenance.    Hypertension Yes    Intervention Provide education on lifestyle modifcations including regular physical activity/exercise, weight management, moderate sodium restriction and increased consumption of fresh fruit, vegetables, and low fat dairy, alcohol moderation, and smoking cessation.;Monitor prescription use compliance.    Expected Outcomes Short Term: Continued assessment and intervention until BP is < 140/23mm HG in hypertensive participants. < 130/61mm HG in hypertensive participants with diabetes, heart failure or chronic kidney disease.;Long Term: Maintenance of blood pressure at goal levels.    Lipids Yes    Intervention Provide education and support for participant on nutrition & aerobic/resistive exercise along with prescribed medications to achieve LDL 70mg , HDL >40mg .    Expected Outcomes Short Term: Participant states understanding of desired cholesterol values and is compliant with medications prescribed. Participant is following exercise prescription and nutrition guidelines.;Long Term: Cholesterol controlled with medications as prescribed, with individualized exercise RX and with personalized nutrition plan. Value goals: LDL < 70mg , HDL > 40 mg.             Core Components/Risk Factors/Patient Goals Review:   Goals and Risk Factor Review     Row Name 03/30/23 1506             Core Components/Risk  Factors/Patient Goals Review   Personal Goals Review Weight Management/Obesity;Improve shortness of breath with ADL's;Hypertension       Review Kyng is doing well in rehab. He was having a lower BP but after water it come up some. He is checking his Blood pressure at home as well. He has noticed an decrease in his SOB with his everyday activties.       Expected Outcomes Short: continue to drink water before exerciuse   long term: continue to monitor risk factors                Core Components/Risk Factors/Patient Goals at Discharge (Final Review):   Goals and Risk Factor Review - 03/30/23 1506       Core Components/Risk Factors/Patient Goals Review   Personal Goals Review Weight Management/Obesity;Improve shortness of breath with ADL's;Hypertension    Review Faaris is doing well in rehab. He was having a lower BP but after water it come up some. He is checking his Blood pressure at home as well. He has noticed an decrease in his SOB with his everyday activties.    Expected Outcomes Short: continue to drink water before exerciuse   long term: continue to monitor risk factors             ITP Comments:  ITP Comments     Row Name 03/04/23 1547 03/17/23 1151 04/13/23 1432 05/12/23 1133     ITP Comments Patient attend orientation today.  Patient is attending Cardiac Rehabilitation Program.  Documentation for diagnosis can be found in [facility] 02/01/23.  Reviewed medical chart, RPE/RPD, gym safety, and program guidelines.  Patient was fitted to equipment they will be using during rehab.  Patient is scheduled to start exercise on Tuesday 03/09/23 at 1330.   Initial ITP created and sent for review and signature by Dr. Dina Rich, Medical Director for Cardiac Rehabilitation Program. 30 day review completed. ITP sent to Dr. Dina Rich, Medical Director of Cardiac Rehab. Continue with ITP unless changes are made by physician.  New to program. 30 day review completed. ITP sent to Dr.  Dina Rich, Medical Director of Cardiac Rehab. Continue with ITP unless changes are made by physician. 30 day review completed. ITP sent to Dr. Dina Rich, Medical Director of Cardiac Rehab. Continue with ITP unless changes are made by physician. Patient has not attended since 04/01/23 and has not returned our calls.  Current chart review shows hermorrage in eye as well. We will send letter to follow up.             Comments: 30 day review

## 2023-05-13 ENCOUNTER — Encounter (HOSPITAL_COMMUNITY): Payer: Medicare HMO

## 2023-05-13 DIAGNOSIS — H01004 Unspecified blepharitis left upper eyelid: Secondary | ICD-10-CM | POA: Diagnosis not present

## 2023-05-13 DIAGNOSIS — S0502XA Injury of conjunctiva and corneal abrasion without foreign body, left eye, initial encounter: Secondary | ICD-10-CM | POA: Diagnosis not present

## 2023-05-13 DIAGNOSIS — H01001 Unspecified blepharitis right upper eyelid: Secondary | ICD-10-CM | POA: Diagnosis not present

## 2023-05-13 DIAGNOSIS — H01002 Unspecified blepharitis right lower eyelid: Secondary | ICD-10-CM | POA: Diagnosis not present

## 2023-05-14 DIAGNOSIS — Z992 Dependence on renal dialysis: Secondary | ICD-10-CM | POA: Diagnosis not present

## 2023-05-14 DIAGNOSIS — N186 End stage renal disease: Secondary | ICD-10-CM | POA: Diagnosis not present

## 2023-05-15 DIAGNOSIS — Z992 Dependence on renal dialysis: Secondary | ICD-10-CM | POA: Diagnosis not present

## 2023-05-15 DIAGNOSIS — N186 End stage renal disease: Secondary | ICD-10-CM | POA: Diagnosis not present

## 2023-05-17 DIAGNOSIS — Z992 Dependence on renal dialysis: Secondary | ICD-10-CM | POA: Diagnosis not present

## 2023-05-17 DIAGNOSIS — N186 End stage renal disease: Secondary | ICD-10-CM | POA: Diagnosis not present

## 2023-05-18 ENCOUNTER — Encounter (HOSPITAL_COMMUNITY): Payer: Medicare HMO

## 2023-05-19 DIAGNOSIS — N186 End stage renal disease: Secondary | ICD-10-CM | POA: Diagnosis not present

## 2023-05-19 DIAGNOSIS — Z992 Dependence on renal dialysis: Secondary | ICD-10-CM | POA: Diagnosis not present

## 2023-05-20 ENCOUNTER — Encounter (HOSPITAL_COMMUNITY): Payer: Medicare HMO

## 2023-05-21 DIAGNOSIS — Z992 Dependence on renal dialysis: Secondary | ICD-10-CM | POA: Diagnosis not present

## 2023-05-21 DIAGNOSIS — N186 End stage renal disease: Secondary | ICD-10-CM | POA: Diagnosis not present

## 2023-05-24 DIAGNOSIS — N186 End stage renal disease: Secondary | ICD-10-CM | POA: Diagnosis not present

## 2023-05-24 DIAGNOSIS — Z992 Dependence on renal dialysis: Secondary | ICD-10-CM | POA: Diagnosis not present

## 2023-05-25 ENCOUNTER — Encounter (HOSPITAL_COMMUNITY): Payer: Medicare HMO

## 2023-05-26 DIAGNOSIS — Z992 Dependence on renal dialysis: Secondary | ICD-10-CM | POA: Diagnosis not present

## 2023-05-26 DIAGNOSIS — N186 End stage renal disease: Secondary | ICD-10-CM | POA: Diagnosis not present

## 2023-05-27 ENCOUNTER — Encounter (HOSPITAL_COMMUNITY): Payer: Medicare HMO

## 2023-05-27 DIAGNOSIS — Z992 Dependence on renal dialysis: Secondary | ICD-10-CM | POA: Diagnosis not present

## 2023-05-27 DIAGNOSIS — N186 End stage renal disease: Secondary | ICD-10-CM | POA: Diagnosis not present

## 2023-05-28 DIAGNOSIS — N186 End stage renal disease: Secondary | ICD-10-CM | POA: Diagnosis not present

## 2023-05-28 DIAGNOSIS — Z992 Dependence on renal dialysis: Secondary | ICD-10-CM | POA: Diagnosis not present

## 2023-05-31 DIAGNOSIS — Z992 Dependence on renal dialysis: Secondary | ICD-10-CM | POA: Diagnosis not present

## 2023-05-31 DIAGNOSIS — N186 End stage renal disease: Secondary | ICD-10-CM | POA: Diagnosis not present

## 2023-06-01 ENCOUNTER — Encounter (HOSPITAL_COMMUNITY): Payer: Medicare HMO

## 2023-06-02 DIAGNOSIS — N186 End stage renal disease: Secondary | ICD-10-CM | POA: Diagnosis not present

## 2023-06-02 DIAGNOSIS — Z992 Dependence on renal dialysis: Secondary | ICD-10-CM | POA: Diagnosis not present

## 2023-06-03 ENCOUNTER — Encounter (HOSPITAL_COMMUNITY): Payer: Medicare HMO

## 2023-06-03 ENCOUNTER — Other Ambulatory Visit (HOSPITAL_COMMUNITY): Payer: Self-pay

## 2023-06-03 ENCOUNTER — Telehealth (HOSPITAL_COMMUNITY): Payer: Self-pay

## 2023-06-04 ENCOUNTER — Encounter (HOSPITAL_COMMUNITY): Payer: Self-pay | Admitting: *Deleted

## 2023-06-04 DIAGNOSIS — Z955 Presence of coronary angioplasty implant and graft: Secondary | ICD-10-CM

## 2023-06-04 DIAGNOSIS — Z992 Dependence on renal dialysis: Secondary | ICD-10-CM | POA: Diagnosis not present

## 2023-06-04 DIAGNOSIS — I214 Non-ST elevation (NSTEMI) myocardial infarction: Secondary | ICD-10-CM

## 2023-06-04 DIAGNOSIS — N186 End stage renal disease: Secondary | ICD-10-CM | POA: Diagnosis not present

## 2023-06-04 NOTE — Progress Notes (Signed)
Cardiac Individual Treatment Plan  Patient Details  Name: Trevor [name] MRN: [id number] Date of Birth: January 02, 1941 Referring Provider:   Flowsheet Row CARDIAC REHAB PHASE II ORIENTATION from 03/04/2023 in [facility] CARDIAC REHABILITATION  Referring Provider Richardo Priest MD  [facility] Cardiologist Dr.Vishnu Mallipeddi]       Initial Encounter Date:  Flowsheet Row CARDIAC REHAB PHASE II ORIENTATION from 03/04/2023 in [place] CARDIAC REHABILITATION  Date 03/04/23       Visit Diagnosis: NSTEMI (non-ST elevated myocardial infarction) [facility])  Status post coronary artery stent placement  Patient's Home Medications on Admission:  Current Outpatient Medications:    ACCU-CHEK AVIVA PLUS test strip, USE TO TEST BLOOD SUGAR BEFORE BREAKFAST AND AT BEDTIME, Disp: 200 strip, Rfl: 2   Accu-Chek Softclix Lancets lancets, , Disp: , Rfl:    acetaminophen (TYLENOL) 325 MG tablet, Take 1-2 tablets (325-650 mg total) by mouth every 6 (six) hours as needed for mild pain (pain score 1-3 or temp > 100.5)., Disp: , Rfl:    albuterol (VENTOLIN HFA) 108 (90 Base) MCG/ACT inhaler, Inhale 2 puffs every 4 hours by inhalation route., Disp: , Rfl:    Alcohol Swabs (B-D SINGLE USE SWABS REGULAR) PADS, , Disp: , Rfl:    aspirin EC 81 MG tablet, Take 1 tablet (81 mg total) by mouth daily with breakfast. Swallow whole., Disp: 30 tablet, Rfl: 12   calcitRIOL (ROCALTROL) 0.25 MCG capsule, Take 5 capsules (1.25 mcg total) by mouth every Monday, Wednesday, and Friday with hemodialysis., Disp: 60 capsule, Rfl: 0   carvedilol (COREG) 12.5 MG tablet, Take 12.5 mg by mouth 2 (two) times daily with a meal., Disp: , Rfl:    Cholecalciferol (VITAMIN D-3 PO), Take 1 tablet by mouth daily., Disp: , Rfl:    CIPRO 250 MG tablet, Take 1 tablet every day by oral route for 7 days., Disp: , Rfl:    CRANBERRY EXTRACT PO, Take 1 tablet by mouth daily., Disp: , Rfl:    hydrALAZINE (APRESOLINE) 50 MG tablet, Take 50 mg by mouth 2  (two) times daily., Disp: , Rfl:    insulin degludec (TRESIBA FLEXTOUCH) 200 UNIT/ML FlexTouch Pen, 10-24 Units as needed (blood sugar). Sliding scale Under 100 Do Not Take (Patient not taking: Reported on 03/04/2023), Disp: , Rfl:    isosorbide mononitrate (IMDUR) 30 MG 24 hr tablet, Take 1 tablet (30 mg total) by mouth daily., Disp: 30 tablet, Rfl: 2   lactulose (CHRONULAC) 10 GM/15ML solution, Take 20 g by mouth daily as needed for moderate constipation., Disp: , Rfl:    nitroGLYCERIN (NITROSTAT) 0.4 MG SL tablet, Place 1 tablet (0.4 mg total) under the tongue every 5 (five) minutes as needed for chest pain., Disp: 25 tablet, Rfl: 3   ondansetron (ZOFRAN) 8 MG tablet, , Disp: , Rfl:    rosuvastatin (CRESTOR) 10 MG tablet, Take 10 mg by mouth every evening., Disp: , Rfl:    ticagrelor (BRILINTA) 90 MG TABS tablet, Take 1 tablet (90 mg total) by mouth 2 (two) times daily., Disp: 180 tablet, Rfl: 3  Past Medical History: Past Medical History:  Diagnosis Date   Anemia    Chronic kidney disease    Stage 4?    Diabetes mellitus without complication (HCC)    Hepatitis    not sure what type - over 20 years ago   History of kidney stones    Hyperlipidemia    Hypertension     Tobacco Use: Social History   Tobacco Use  Smoking Status Never  Smokeless Tobacco Never    Labs: Review Flowsheet  More data exists      Latest Ref Rng & Units 12/17/2020 07/22/2022 07/23/2022 02/01/2023 02/03/2023  Labs for ITP Cardiac and Pulmonary Rehab  Cholestrol 0 - 200 mg/dL - - - - 161   LDL (calc) 0 - 99 mg/dL - - - - 42   HDL-C >09 mg/dL - - - - 47   Trlycerides <150 mg/dL - - - - 55   Hemoglobin A1c 4.8 - 5.6 % - 5.7  - 5.3  -  TCO2 22 - 32 mmol/L 27  - 28  - -    Capillary Blood Glucose: Lab Results  Component Value Date   GLUCAP 90 03/09/2023   GLUCAP 96 03/09/2023   GLUCAP 174 (H) 02/03/2023   GLUCAP 158 (H) 02/03/2023   GLUCAP 164 (H) 02/03/2023     Exercise Target Goals: Exercise  Program Goal: Individual exercise prescription set using results from initial 6 min walk test and THRR while considering  patient's activity barriers and safety.   Exercise Prescription Goal: Starting with aerobic activity 30 plus minutes a day, 3 days per week for initial exercise prescription. Provide home exercise prescription and guidelines that participant acknowledges understanding prior to discharge.  Activity Barriers & Risk Stratification:  Activity Barriers & Cardiac Risk Stratification - 03/04/23 1344       Activity Barriers & Cardiac Risk Stratification   Activity Barriers Left Hip Replacement;Right Hip Replacement;History of Falls;Deconditioning;Balance Concerns;Muscular Weakness;Shortness of Breath    Cardiac Risk Stratification High             6 Minute Walk:  6 Minute Walk     Row Name 03/04/23 1554         6 Minute Walk   Phase Initial     Distance 880 feet     Walk Time 6 minutes     # of Rest Breaks 0     MPH 1.66     METS 1.78     RPE 11     Perceived Dyspnea  1     VO2 Peak 6.24     Symptoms Yes (comment)     Comments release in chest tightness, slightly SOB     Resting HR 78 bpm     Resting BP 116/62     Resting Oxygen Saturation  98 %     Exercise Oxygen Saturation  during 6 min walk 95 %     Max Ex. HR 92 bpm     Max Ex. BP 172/84     2 Minute Post BP 142/74              Oxygen Initial Assessment:   Oxygen Re-Evaluation:   Oxygen Discharge (Final Oxygen Re-Evaluation):   Initial Exercise Prescription:  Initial Exercise Prescription - 03/04/23 1500       Date of Initial Exercise RX and Referring Provider   Date 03/04/23    Referring Provider Richardo Priest MD   Primary Cardiologist Dr.Vishnu Mallipeddi     Oxygen   Maintain Oxygen Saturation 88% or higher      Treadmill   MPH 1.2    Grade 0.5    Minutes 15    METs 2      NuStep   Level 2    SPM 80    Minutes 15    METs 2      Prescription Details   Frequency  (times per week)  2    Duration Progress to 30 minutes of continuous aerobic without signs/symptoms of physical distress      Intensity   THRR 40-80% of Max Heartrate 102-127    Ratings of Perceived Exertion 11-13    Perceived Dyspnea 0-4      Progression   Progression Continue to progress workloads to maintain intensity without signs/symptoms of physical distress.      Resistance Training   Training Prescription Yes    Weight 3 lb    Reps 10-15             Perform Capillary Blood Glucose checks as needed.  Exercise Prescription Changes:   Exercise Prescription Changes     Row Name 03/04/23 1500 03/09/23 1500 03/25/23 1400 04/01/23 1500       Response to Exercise   Blood Pressure (Admit) 116/62 122/52 142/52 122/50    Blood Pressure (Exercise) 172/84 132/62 148/68 --    Blood Pressure (Exit) 142/74 120/60 138/68 128/58    Heart Rate (Admit) 78 bpm 57 bpm 64 bpm 65 bpm    Heart Rate (Exercise) 92 bpm 93 bpm 96 bpm 120 bpm    Heart Rate (Exit) 74 bpm 75 bpm 86 bpm 62 bpm    Oxygen Saturation (Admit) 98 % -- -- --    Oxygen Saturation (Exercise) 95 % -- -- --    Rating of Perceived Exertion (Exercise) 11 12 12 12     Perceived Dyspnea (Exercise) 1 -- -- 1    Symptoms chest tightness released, Slightly SOB -- -- --    Comments walk test results -- -- --    Duration -- Continue with 30 min of aerobic exercise without signs/symptoms of physical distress. Continue with 30 min of aerobic exercise without signs/symptoms of physical distress. Continue with 30 min of aerobic exercise without signs/symptoms of physical distress.    Intensity -- THRR unchanged THRR unchanged THRR unchanged      Progression   Progression -- Continue to progress workloads to maintain intensity without signs/symptoms of physical distress. Continue to progress workloads to maintain intensity without signs/symptoms of physical distress. Continue to progress workloads to maintain intensity without  signs/symptoms of physical distress.      Resistance Training   Training Prescription -- Yes Yes Yes    Weight -- 3 lbs 3 lbs 4 lbs    Reps -- 10-15 10-15 10-15      Oxygen   Oxygen -- Continuous -- --      Treadmill   MPH -- 1.2 -- --    Grade -- 0.5 -- --    Minutes -- 15 -- --    METs -- 2 -- --      NuStep   Level -- 1 4 4     SPM -- 60 59 60    Minutes -- 15 15 15     METs -- 1.5 1.9 1.95      Track   Laps -- 20 18 22     Minutes -- -- 15 15    METs -- -- 1.7 2      Oxygen   Maintain Oxygen Saturation -- 88% or higher 88% or higher 88% or higher             Exercise Comments:   Exercise Goals and Review:   Exercise Goals     Row Name 03/04/23 1602             Exercise Goals   Increase Physical Activity Yes  Intervention Provide advice, education, support and counseling about physical activity/exercise needs.;Develop an individualized exercise prescription for aerobic and resistive training based on initial evaluation findings, risk stratification, comorbidities and participant's personal goals.       Expected Outcomes Short Term: Attend rehab on a regular basis to increase amount of physical activity.;Long Term: Add in home exercise to make exercise part of routine and to increase amount of physical activity.;Long Term: Exercising regularly at least 3-5 days a week.       Increase Strength and Stamina Yes       Intervention Provide advice, education, support and counseling about physical activity/exercise needs.;Develop an individualized exercise prescription for aerobic and resistive training based on initial evaluation findings, risk stratification, comorbidities and participant's personal goals.       Expected Outcomes Short Term: Increase workloads from initial exercise prescription for resistance, speed, and METs.;Short Term: Perform resistance training exercises routinely during rehab and add in resistance training at home;Long Term: Improve  cardiorespiratory fitness, muscular endurance and strength as measured by increased METs and functional capacity ( )       Able to understand and use rate of perceived exertion (RPE) scale Yes       Intervention Provide education and explanation on how to use RPE scale       Expected Outcomes Short Term: Able to use RPE daily in rehab to express subjective intensity level;Long Term:  Able to use RPE to guide intensity level when exercising independently       Able to understand and use Dyspnea scale Yes       Intervention Provide education and explanation on how to use Dyspnea scale       Expected Outcomes Short Term: Able to use Dyspnea scale daily in rehab to express subjective sense of shortness of breath during exertion;Long Term: Able to use Dyspnea scale to guide intensity level when exercising independently       Knowledge and understanding of Target Heart Rate Range (THRR) Yes       Intervention Provide education and explanation of THRR including how the numbers were predicted and where they are located for reference       Expected Outcomes Short Term: Able to state/look up THRR;Long Term: Able to use THRR to govern intensity when exercising independently;Short Term: Able to use daily as guideline for intensity in rehab       Able to check pulse independently Yes       Intervention Provide education and demonstration on how to check pulse in carotid and radial arteries.;Review the importance of being able to check your own pulse for safety during independent exercise       Expected Outcomes Short Term: Able to explain why pulse checking is important during independent exercise;Long Term: Able to check pulse independently and accurately       Understanding of Exercise Prescription Yes       Intervention Provide education, explanation, and written materials on patient's individual exercise prescription       Expected Outcomes Short Term: Able to explain program exercise prescription;Long  Term: Able to explain home exercise prescription to exercise independently                Exercise Goals Re-Evaluation :  Exercise Goals Re-Evaluation     Row Name 03/30/23 1456 04/09/23 0801           Exercise Goal Re-Evaluation   Exercise Goals Review Increase Physical Activity;Increase Strength and Stamina;Understanding of Exercise Prescription Increase Physical  Activity;Increase Strength and Stamina;Understanding of Exercise Prescription      Comments Mitul is doing well in rehab. He has been coming in late missing the warm up, today i reminded him of the time we start. He has noticed an increase in his endurance and his SOB decreaing when doing activies. He is walking at homw in the evenings. Azan is doing well in rehab and is tolerating exercise okay. He is walking the track due to not liking the treadmill. He has been coming in late to rehab and will miss all the warmup some. Will continue to monitor and progressed as able,      Expected Outcomes Short: keep exercising in at rehab   long term: continue to exericse at home and rehab Short: keep coming to rehab on time and regular basis   long term: continue to attend rehab                Discharge Exercise Prescription (Final Exercise Prescription Changes):  Exercise Prescription Changes - 04/01/23 1500       Response to Exercise   Blood Pressure (Admit) 122/50    Blood Pressure (Exit) 128/58    Heart Rate (Admit) 65 bpm    Heart Rate (Exercise) 120 bpm    Heart Rate (Exit) 62 bpm    Rating of Perceived Exertion (Exercise) 12    Perceived Dyspnea (Exercise) 1    Duration Continue with 30 min of aerobic exercise without signs/symptoms of physical distress.    Intensity THRR unchanged      Progression   Progression Continue to progress workloads to maintain intensity without signs/symptoms of physical distress.      Resistance Training   Training Prescription Yes    Weight 4 lbs    Reps 10-15      NuStep    Level 4    SPM 60    Minutes 15    METs 1.95      Track   Laps 22    Minutes 15    METs 2      Oxygen   Maintain Oxygen Saturation 88% or higher             Nutrition:  Target Goals: Understanding of nutrition guidelines, daily intake of sodium 1500mg , cholesterol 200mg , calories 30% from fat and 7% or less from saturated fats, daily to have 5 or more servings of fruits and vegetables.  Biometrics:  Pre Biometrics - 03/04/23 1602       Pre Biometrics   Height 5\' 10"  (1.778 m)    Weight 195 lb 14.4 oz (88.9 kg)    Waist Circumference 37 inches    Hip Circumference 41 inches    Waist to Hip Ratio 0.9 %    BMI (Calculated) 28.11    Grip Strength 18.6 kg    Single Leg Stand 4.9 seconds              Nutrition Therapy Plan and Nutrition Goals:  Nutrition Therapy & Goals - 03/04/23 1611       Intervention Plan   Intervention Prescribe, educate and counsel regarding individualized specific dietary modifications aiming towards targeted core components such as weight, hypertension, lipid management, diabetes, heart failure and other comorbidities.;Nutrition handout(s) given to patient.    Expected Outcomes Short Term Goal: Understand basic principles of dietary content, such as calories, fat, sodium, cholesterol and nutrients.;Long Term Goal: Adherence to prescribed nutrition plan.  Nutrition Assessments:  MEDIFICTS Score Key: >=70 Need to make dietary changes  40-70 Heart Healthy Diet <= 40 Therapeutic Level Cholesterol Diet  Flowsheet Row CARDIAC REHAB PHASE II ORIENTATION from 03/04/2023 in [facility] CARDIAC REHABILITATION  Picture Your Plate Total Score on Admission 74      Picture Your Plate Scores: <16 Unhealthy dietary pattern with much room for improvement. 41-50 Dietary pattern unlikely to meet recommendations for good health and room for improvement. 51-60 More healthful dietary pattern, with some room for improvement.  >60  Healthy dietary pattern, although there may be some specific behaviors that could be improved.    Nutrition Goals Re-Evaluation:  Nutrition Goals Re-Evaluation     Row Name 03/30/23 1501             Goals   Nutrition Goal healthy eating       Comment Jaivian is doing well in rehab. He is eating healthy at home. He said that he eats mostly chicken, [place] and fish. He aslo said that he eats collard greens. He only drinks mostly water and does not really drink soda and tea.       Expected Outcome Short: continue to pick healthy options and drink more water   long term: cotninuet with healthy eating                Nutrition Goals Discharge (Final Nutrition Goals Re-Evaluation):  Nutrition Goals Re-Evaluation - 03/30/23 1501       Goals   Nutrition Goal healthy eating    Comment Eldar is doing well in rehab. He is eating healthy at home. He said that he eats mostly chicken, [place] and fish. He aslo said that he eats collard greens. He only drinks mostly water and does not really drink soda and tea.    Expected Outcome Short: continue to pick healthy options and drink more water   long term: cotninuet with healthy eating             Psychosocial: Target Goals: Acknowledge presence or absence of significant depression and/or stress, maximize coping skills, provide positive support system. Participant is able to verbalize types and ability to use techniques and skills needed for reducing stress and depression.  Initial Review & Psychosocial Screening:  Initial Psych Review & Screening - 03/04/23 1355       Initial Review   Current issues with Current Stress Concerns;Current Sleep Concerns    Source of Stress Concerns Unable to participate in former interests or hobbies;Unable to perform yard/household activities;Chronic Illness;Family    Comments Patient's wife lung cancer survivor, and son two strokes. Not sleeping well recently (tried melatonin to help) from feeling like he  is SOB when he lays down      Family Dynamics   Good Support System? Yes   wife, sister     Barriers   Psychosocial barriers to participate in program The patient should benefit from training in stress management and relaxation.;Psychosocial barriers identified (see note)      Screening Interventions   Interventions Encouraged to exercise;To provide support and resources with identified psychosocial needs;Provide feedback about the scores to participant    Expected Outcomes Short Term goal: Utilizing psychosocial counselor, staff and physician to assist with identification of specific Stressors or current issues interfering with healing process. Setting desired goal for each stressor or current issue identified.;Long Term Goal: Stressors or current issues are controlled or eliminated.;Short Term goal: Identification and review with participant of any Quality of Life or Depression  concerns found by scoring the questionnaire.;Long Term goal: The participant improves quality of Life and PHQ9 Scores as seen by post scores and/or verbalization of changes             Quality of Life Scores:  Quality of Life - 03/04/23 1611       Quality of Life   Select Quality of Life      Quality of Life Scores   Health/Function Pre 24 %    Socioeconomic Pre 24.86 %    Psych/Spiritual Pre 26.57 %    Family Pre 25.2 %    GLOBAL Pre 24.88 %            Scores of 19 and below usually indicate a poorer quality of life in these areas.  A difference of  2-3 points is a clinically meaningful difference.  A difference of 2-3 points in the total score of the Quality of Life Index has been associated with significant improvement in overall quality of life, self-image, physical symptoms, and general health in studies assessing change in quality of life.  PHQ-9: Review Flowsheet       03/04/2023 02/11/2023  Depression screen PHQ 2/9  Decreased Interest 0 0  Down, Depressed, Hopeless 1 1  PHQ - 2 Score 1  1  Altered sleeping 3 -  Tired, decreased energy 0 -  Change in appetite 0 -  Feeling bad or failure about yourself  1 -  Trouble concentrating 1 -  Moving slowly or fidgety/restless 1 -  Suicidal thoughts 0 -  PHQ-9 Score 7 -  Difficult doing work/chores Very difficult -   Interpretation of Total Score  Total Score Depression Severity:  1-4 = Minimal depression, 5-9 = Mild depression, 10-14 = Moderate depression, 15-19 = Moderately severe depression, 20-27 = Severe depression   Psychosocial Evaluation and Intervention:  Psychosocial Evaluation - 03/04/23 1607       Psychosocial Evaluation & Interventions   Interventions Stress management education;Encouraged to exercise with the program and follow exercise prescription    Comments Jahquan is coming into cardiac rehab after a NSTEMI and second stent.  He was not able to start last month due to the MI.  Since last review he is having a hard time sleeping as he feels short of breath any time he lays down.  He is still on dialysis MWF and will attend program on TTh.  He is looking forwad to program and hopes it will help with his breathing and getting back his stamina.  He wants to improve his heart health and build up his strength and stamina. He now covered at 100%.    Expected Outcomes Short term: Start and attend the program consistently. Long Term: Build up stamina to be able to go and do again    Continue Psychosocial Services  Follow up required by staff             Psychosocial Re-Evaluation:  Psychosocial Re-Evaluation     Row Name 03/30/23 1459             Psychosocial Re-Evaluation   Current issues with None Identified       Comments Jony is doing well in rehab. He is going to dyalisis throughout the week. He stated that he has no sleep issues other then gettiung up 2xs a noight to use the restroom. He also stated that he has not stress concerns.       Expected Outcomes Short: continue to have no barriers long  term: continue to exerciuse to help overall well being       Interventions Encouraged to attend Cardiac Rehabilitation for the exercise       Continue Psychosocial Services  Follow up required by staff                Psychosocial Discharge (Final Psychosocial Re-Evaluation):  Psychosocial Re-Evaluation - 03/30/23 1459       Psychosocial Re-Evaluation   Current issues with None Identified    Comments Dameer is doing well in rehab. He is going to dyalisis throughout the week. He stated that he has no sleep issues other then gettiung up 2xs a noight to use the restroom. He also stated that he has not stress concerns.    Expected Outcomes Short: continue to have no barriers long term: continue to exerciuse to help overall well being    Interventions Encouraged to attend Cardiac Rehabilitation for the exercise    Continue Psychosocial Services  Follow up required by staff             Vocational Rehabilitation: Provide vocational rehab assistance to qualifying candidates.   Vocational Rehab Evaluation & Intervention:  Vocational Rehab - 03/04/23 1358       Initial Vocational Rehab Evaluation & Intervention   Assessment shows need for Vocational Rehabilitation No   retired            Education: Education Goals: Education classes will be provided on a weekly basis, covering required topics. Participant will state understanding/return demonstration of topics presented.  Learning Barriers/Preferences:  Learning Barriers/Preferences - 03/04/23 1357       Learning Barriers/Preferences   Learning Barriers Sight   glasses for reading   Learning Preferences Written Material             Education Topics: Hypertension, Hypertension Reduction -Define heart disease and high blood pressure. Discus how high blood pressure affects the body and ways to reduce high blood pressure.   Exercise and Your Heart -Discuss why it is important to exercise, the FITT principles of  exercise, normal and abnormal responses to exercise, and how to exercise safely. Flowsheet Row CARDIAC REHAB PHASE II EXERCISE from 04/01/2023 in [place] CARDIAC REHABILITATION  Date 03/25/23  Educator hb  Instruction Review Code 1- Verbalizes Understanding       Angina -Discuss definition of angina, causes of angina, treatment of angina, and how to decrease risk of having angina.   Cardiac Medications -Review what the following cardiac medications are used for, how they affect the body, and side effects that may occur when taking the medications.  Medications include Aspirin, Beta blockers, calcium channel blockers, ACE Inhibitors, angiotensin receptor blockers, diuretics, digoxin, and antihyperlipidemics.   Congestive Heart Failure -Discuss the definition of CHF, how to live with CHF, the signs and symptoms of CHF, and how keep track of weight and sodium intake.   Heart Disease and Intimacy -Discus the effect sexual activity has on the heart, how changes occur during intimacy as we age, and safety during sexual activity. Flowsheet Row CARDIAC REHAB PHASE II EXERCISE from 04/01/2023 in [place] CARDIAC REHABILITATION  Date 03/18/23  Educator HB  Instruction Review Code 1- Verbalizes Understanding       Smoking Cessation / COPD -Discuss different methods to quit smoking, the health benefits of quitting smoking, and the definition of COPD.   Nutrition I: Fats -Discuss the types of cholesterol, what cholesterol does to the heart, and how cholesterol levels can be controlled. Flowsheet Row  CARDIAC REHAB PHASE II EXERCISE from 04/01/2023 in [facility]  Date 04/01/23  Educator HB  Instruction Review Code 1- Verbalizes Understanding       Nutrition II: Labels -Discuss the different components of food labels and how to read food label Flowsheet Row CARDIAC REHAB PHASE II EXERCISE from 04/01/2023 in [facility]  Date 04/01/23   Educator HB  Instruction Review Code 1- Verbalizes Understanding       Heart Parts/Heart Disease and PAD -Discuss the anatomy of the heart, the pathway of blood circulation through the heart, and these are affected by heart disease.   Stress I: Signs and Symptoms -Discuss the causes of stress, how stress may lead to anxiety and depression, and ways to limit stress.   Stress II: Relaxation -Discuss different types of relaxation techniques to limit stress.   Warning Signs of Stroke / TIA -Discuss definition of a stroke, what the signs and symptoms are of a stroke, and how to identify when someone is having stroke.   Knowledge Questionnaire Score:  Knowledge Questionnaire Score - 03/04/23 1615       Knowledge Questionnaire Score   Pre Score 15/24             Core Components/Risk Factors/Patient Goals at Admission:  Personal Goals and Risk Factors at Admission - 03/04/23 1615       Core Components/Risk Factors/Patient Goals on Admission    Weight Management Yes;Weight Loss    Intervention Weight Management: Develop a combined nutrition and exercise program designed to reach desired caloric intake, while maintaining appropriate intake of nutrient and fiber, sodium and fats, and appropriate energy expenditure required for the weight goal.;Weight Management: Provide education and appropriate resources to help participant work on and attain dietary goals.;Weight Management/Obesity: Establish reasonable short term and long term weight goals.    Admit Weight 195 lb 14.4 oz (88.9 kg)    Goal Weight: Short Term 190 lb (86.2 kg)    Goal Weight: Long Term 190 lb (86.2 kg)    Expected Outcomes Long Term: Adherence to nutrition and physical activity/exercise program aimed toward attainment of established weight goal;Short Term: Continue to assess and modify interventions until short term weight is achieved;Weight Maintenance: Understanding of the daily nutrition guidelines, which  includes 25-35% calories from fat, 7% or less cal from saturated fats, less than 200mg  cholesterol, less than 1.5gm of sodium, & 5 or more servings of fruits and vegetables daily;Weight Loss: Understanding of general recommendations for a balanced deficit meal plan, which promotes 1-2 lb weight loss per week and includes a negative energy balance of [phone] kcal/d;Understanding recommendations for meals to include 15-35% energy as protein, 25-35% energy from fat, 35-60% energy from carbohydrates, less than 200mg  of dietary cholesterol, 20-35 gm of total fiber daily;Understanding of distribution of calorie intake throughout the day with the consumption of 4-5 meals/snacks    Improve shortness of breath with ADL's Yes    Intervention Provide education, individualized exercise plan and daily activity instruction to help decrease symptoms of SOB with activities of daily living.    Expected Outcomes Short Term: Improve cardiorespiratory fitness to achieve a reduction of symptoms when performing ADLs;Long Term: Be able to perform more ADLs without symptoms or delay the onset of symptoms    Diabetes Yes   A1c now 5.3   Intervention Provide education about signs/symptoms and action to take for hypo/hyperglycemia.;Provide education about proper nutrition, including hydration, and aerobic/resistive exercise prescription along with prescribed medications to achieve  blood glucose in normal ranges: Fasting glucose 65-99 mg/dL    Expected Outcomes Long Term: Attainment of HbA1C < 7%.;Short Term: Participant verbalizes understanding of the signs/symptoms and immediate care of hyper/hypoglycemia, proper foot care and importance of medication, aerobic/resistive exercise and nutrition plan for blood glucose control.    Heart Failure Yes    Intervention Provide a combined exercise and nutrition program that is supplemented with education, support and counseling about heart failure. Directed toward relieving symptoms such as  shortness of breath, decreased exercise tolerance, and extremity edema.    Expected Outcomes Improve functional capacity of life;Short term: Attendance in program 2-3 days a week with increased exercise capacity. Reported lower sodium intake. Reported increased fruit and vegetable intake. Reports medication compliance.;Short term: Daily weights obtained and reported for increase. Utilizing diuretic protocols set by physician.;Long term: Adoption of self-care skills and reduction of barriers for early signs and symptoms recognition and intervention leading to self-care maintenance.    Hypertension Yes    Intervention Provide education on lifestyle modifcations including regular physical activity/exercise, weight management, moderate sodium restriction and increased consumption of fresh fruit, vegetables, and low fat dairy, alcohol moderation, and smoking cessation.;Monitor prescription use compliance.    Expected Outcomes Short Term: Continued assessment and intervention until BP is < 140/72mm HG in hypertensive participants. < 130/46mm HG in hypertensive participants with diabetes, heart failure or chronic kidney disease.;Long Term: Maintenance of blood pressure at goal levels.    Lipids Yes    Intervention Provide education and support for participant on nutrition & aerobic/resistive exercise along with prescribed medications to achieve LDL 70mg , HDL >40mg .    Expected Outcomes Short Term: Participant states understanding of desired cholesterol values and is compliant with medications prescribed. Participant is following exercise prescription and nutrition guidelines.;Long Term: Cholesterol controlled with medications as prescribed, with individualized exercise RX and with personalized nutrition plan. Value goals: LDL < 70mg , HDL > 40 mg.             Core Components/Risk Factors/Patient Goals Review:   Goals and Risk Factor Review     Row Name 03/30/23 1506             Core Components/Risk  Factors/Patient Goals Review   Personal Goals Review Weight Management/Obesity;Improve shortness of breath with ADL's;Hypertension       Review Kei is doing well in rehab. He was having a lower BP but after water it come up some. He is checking his Blood pressure at home as well. He has noticed an decrease in his SOB with his everyday activties.       Expected Outcomes Short: continue to drink water before exerciuse   long term: continue to monitor risk factors                Core Components/Risk Factors/Patient Goals at Discharge (Final Review):   Goals and Risk Factor Review - 03/30/23 1506       Core Components/Risk Factors/Patient Goals Review   Personal Goals Review Weight Management/Obesity;Improve shortness of breath with ADL's;Hypertension    Review Laban is doing well in rehab. He was having a lower BP but after water it come up some. He is checking his Blood pressure at home as well. He has noticed an decrease in his SOB with his everyday activties.    Expected Outcomes Short: continue to drink water before exerciuse   long term: continue to monitor risk factors             ITP Comments:  ITP Comments     Row Name 03/04/23 1547 03/17/23 1151 04/13/23 1432 05/12/23 1133 06/04/23 1135   ITP Comments Patient attend orientation today.  Patient is attending Cardiac Rehabilitation Program.  Documentation for diagnosis can be found in [facility] 02/01/23.  Reviewed medical chart, RPE/RPD, gym safety, and program guidelines.  Patient was fitted to equipment they will be using during rehab.  Patient is scheduled to start exercise on Tuesday 03/09/23 at 1330.   Initial ITP created and sent for review and signature by Dr. Dina Rich, Medical Director for Cardiac Rehabilitation Program. 30 day review completed. ITP sent to Dr. Dina Rich, Medical Director of Cardiac Rehab. Continue with ITP unless changes are made by physician.  New to program. 30 day review completed. ITP sent to  Dr. Dina Rich, Medical Director of Cardiac Rehab. Continue with ITP unless changes are made by physician. 30 day review completed. ITP sent to Dr. Dina Rich, Medical Director of Cardiac Rehab. Continue with ITP unless changes are made by physician. Patient has not attended since 04/01/23 and has not returned our calls.  Current chart review shows hermorrage in eye as well. We will send letter to follow up. Patient was sent letter and called multiple times.  He has not returned our attempted contact.  We will discharge at this time.  He completed 9 sessions.            Comments: Discharge ITP

## 2023-06-04 NOTE — Progress Notes (Signed)
Discharge Progress Report  Patient Details  Name: Trevor [name] MRN: [id number] Date of Birth: 1940-12-10 Referring Provider:   Flowsheet Row CARDIAC REHAB PHASE II ORIENTATION from 03/04/2023 in [facility]AC [facility]  Referring Provider Richardo Priest MD  [facility] Cardiologist Dr.Vishnu Mallipeddi]        Number of Visits: 9  Reason for Discharge:  Early Exit:  Lack of attendance  Smoking History:  Social History   Tobacco Use  Smoking Status Never  Smokeless Tobacco Never    Diagnosis:  NSTEMI (non-ST elevated myocardial infarction) [facility])  Status post coronary artery stent placement  Initial Exercise Prescription:  Initial Exercise Prescription - 03/04/23 1500       Date of Initial Exercise RX and Referring Provider   Date 03/04/23    Referring Provider Richardo Priest MD   Primary Cardiologist Dr.Vishnu Mallipeddi     Oxygen   Maintain Oxygen Saturation 88% or higher      Treadmill   MPH 1.2    Grade 0.5    Minutes 15    METs 2      NuStep   Level 2    SPM 80    Minutes 15    METs 2      Prescription Details   Frequency (times per week) 2    Duration Progress to 30 minutes of continuous aerobic without signs/symptoms of physical distress      Intensity   THRR 40-80% of Max Heartrate 102-127    Ratings of Perceived Exertion 11-13    Perceived Dyspnea 0-4      Progression   Progression Continue to progress workloads to maintain intensity without signs/symptoms of physical distress.      Resistance Training   Training Prescription Yes    Weight 3 lb    Reps 10-15             Discharge Exercise Prescription (Final Exercise Prescription Changes):  Exercise Prescription Changes - 04/01/23 1500       Response to Exercise   Blood Pressure (Admit) 122/50    Blood Pressure (Exit) 128/58    Heart Rate (Admit) 65 bpm    Heart Rate (Exercise) 120 bpm    Heart Rate (Exit) 62 bpm    Rating of Perceived Exertion (Exercise) 12     Perceived Dyspnea (Exercise) 1    Duration Continue with 30 min of aerobic exercise without signs/symptoms of physical distress.    Intensity THRR unchanged      Progression   Progression Continue to progress workloads to maintain intensity without signs/symptoms of physical distress.      Resistance Training   Training Prescription Yes    Weight 4 lbs    Reps 10-15      NuStep   Level 4    SPM 60    Minutes 15    METs 1.95      Track   Laps 22    Minutes 15    METs 2      Oxygen   Maintain Oxygen Saturation 88% or higher             Functional Capacity:  6 Minute Walk     Row Name 03/04/23 1554         6 Minute Walk   Phase Initial     Distance 880 feet     Walk Time 6 minutes     # of Rest Breaks 0     MPH 1.66  METS 1.78     RPE 11     Perceived Dyspnea  1     VO2 Peak 6.24     Symptoms Yes (comment)     Comments release in chest tightness, slightly SOB     Resting HR 78 bpm     Resting BP 116/62     Resting Oxygen Saturation  98 %     Exercise Oxygen Saturation  during 6 min walk 95 %     Max Ex. HR 92 bpm     Max Ex. BP 172/84     2 Minute Post BP 142/74              Psychological, QOL, Others - Outcomes: PHQ 2/9:    03/04/2023    1:48 PM 02/11/2023    1:53 PM  Depression screen PHQ 2/9  Decreased Interest 0 0  Down, Depressed, Hopeless 1 1  PHQ - 2 Score 1 1  Altered sleeping 3   Tired, decreased energy 0   Change in appetite 0   Feeling bad or failure about yourself  1   Trouble concentrating 1   Moving slowly or fidgety/restless 1   Suicidal thoughts 0   PHQ-9 Score 7   Difficult doing work/chores Very difficult     Quality of Life:  Quality of Life - 03/04/23 1611       Quality of Life   Select Quality of Life      Quality of Life Scores   Health/Function Pre 24 %    Socioeconomic Pre 24.86 %    Psych/Spiritual Pre 26.57 %    Family Pre 25.2 %    GLOBAL Pre 24.88 %                Nutrition & Weight  - Outcomes:  Pre Biometrics - 03/04/23 1602       Pre Biometrics   Height 5\' 10"  (1.778 m)    Weight 195 lb 14.4 oz (88.9 kg)    Waist Circumference 37 inches    Hip Circumference 41 inches    Waist to Hip Ratio 0.9 %    BMI (Calculated) 28.11    Grip Strength 18.6 kg    Single Leg Stand 4.9 seconds             Education Questionnaire Score:  Knowledge Questionnaire Score - 03/04/23 1615       Knowledge Questionnaire Score   Pre Score 15/24

## 2023-06-07 ENCOUNTER — Other Ambulatory Visit: Payer: Self-pay

## 2023-06-07 ENCOUNTER — Emergency Department (HOSPITAL_COMMUNITY)
Admission: EM | Admit: 2023-06-07 | Discharge: 2023-06-07 | Disposition: A | Discharge status: Home / Self Care | Payer: Medicare HMO

## 2023-06-07 ENCOUNTER — Emergency Department (HOSPITAL_COMMUNITY): Payer: Medicare HMO

## 2023-06-07 ENCOUNTER — Encounter (HOSPITAL_COMMUNITY): Payer: Self-pay | Admitting: *Deleted

## 2023-06-07 DIAGNOSIS — N12 Tubulo-interstitial nephritis, not specified as acute or chronic: Secondary | ICD-10-CM | POA: Diagnosis not present

## 2023-06-07 DIAGNOSIS — E1122 Type 2 diabetes mellitus with diabetic chronic kidney disease: Secondary | ICD-10-CM | POA: Insufficient documentation

## 2023-06-07 DIAGNOSIS — I10 Essential (primary) hypertension: Secondary | ICD-10-CM | POA: Diagnosis not present

## 2023-06-07 DIAGNOSIS — Z992 Dependence on renal dialysis: Secondary | ICD-10-CM | POA: Diagnosis not present

## 2023-06-07 DIAGNOSIS — R109 Unspecified abdominal pain: Secondary | ICD-10-CM | POA: Diagnosis present

## 2023-06-07 DIAGNOSIS — I120 Hypertensive chronic kidney disease with stage 5 chronic kidney disease or end stage renal disease: Secondary | ICD-10-CM | POA: Diagnosis not present

## 2023-06-07 DIAGNOSIS — Z794 Long term (current) use of insulin: Secondary | ICD-10-CM | POA: Insufficient documentation

## 2023-06-07 DIAGNOSIS — Z79899 Other long term (current) drug therapy: Secondary | ICD-10-CM | POA: Insufficient documentation

## 2023-06-07 DIAGNOSIS — N200 Calculus of kidney: Secondary | ICD-10-CM | POA: Diagnosis not present

## 2023-06-07 DIAGNOSIS — N186 End stage renal disease: Secondary | ICD-10-CM | POA: Diagnosis not present

## 2023-06-07 DIAGNOSIS — Z7982 Long term (current) use of aspirin: Secondary | ICD-10-CM | POA: Diagnosis not present

## 2023-06-07 DIAGNOSIS — K4090 Unilateral inguinal hernia, without obstruction or gangrene, not specified as recurrent: Secondary | ICD-10-CM | POA: Diagnosis not present

## 2023-06-07 LAB — URINALYSIS, ROUTINE W REFLEX MICROSCOPIC
Bilirubin Urine: NEGATIVE
Glucose, UA: 150 mg/dL — AB
Hgb urine dipstick: NEGATIVE
Ketones, ur: NEGATIVE mg/dL
Nitrite: NEGATIVE
Protein, ur: 100 mg/dL — AB
Specific Gravity, Urine: 1.013 (ref 1.005–1.030)
WBC, UA: >50 WBC/hpf (ref 0–5)
pH: 8 (ref 5.0–8.0)

## 2023-06-07 LAB — LIPASE, BLOOD: Lipase: 63 U/L — ABNORMAL HIGH (ref 11–51)

## 2023-06-07 LAB — CBC WITH DIFFERENTIAL/PLATELET
Abs Immature Granulocytes: 0.02 10*3/uL (ref 0.00–0.07)
Basophils Absolute: 0 10*3/uL (ref 0.0–0.1)
Basophils Relative: 0 %
Eosinophils Absolute: 0.3 10*3/uL (ref 0.0–0.5)
Eosinophils Relative: 3 %
HCT: 44.6 % (ref 39.0–52.0)
Hemoglobin: 14.2 g/dL (ref 13.0–17.0)
Immature Granulocytes: 0 %
Lymphocytes Relative: 5 %
Lymphs Abs: 0.5 10*3/uL — ABNORMAL LOW (ref 0.7–4.0)
MCH: 30.7 pg (ref 26.0–34.0)
MCHC: 31.8 g/dL (ref 30.0–36.0)
MCV: 96.5 fL (ref 80.0–100.0)
Monocytes Absolute: 0.5 10*3/uL (ref 0.1–1.0)
Monocytes Relative: 6 %
Neutro Abs: 7.8 10*3/uL — ABNORMAL HIGH (ref 1.7–7.7)
Neutrophils Relative %: 86 %
Platelets: 202 10*3/uL (ref 150–400)
RBC: 4.62 MIL/uL (ref 4.22–5.81)
RDW: 15.4 % (ref 11.5–15.5)
WBC: 9.1 10*3/uL (ref 4.0–10.5)
nRBC: 0 % (ref 0.0–0.2)

## 2023-06-07 LAB — COMPREHENSIVE METABOLIC PANEL
ALT: 84 U/L — ABNORMAL HIGH (ref 0–44)
AST: 111 U/L — ABNORMAL HIGH (ref 15–41)
Albumin: 4.5 g/dL (ref 3.5–5.0)
Alkaline Phosphatase: 78 U/L (ref 38–126)
Anion gap: 13 (ref 5–15)
BUN: 39 mg/dL — ABNORMAL HIGH (ref 8–23)
CO2: 28 mmol/L (ref 22–32)
Calcium: 10.1 mg/dL (ref 8.9–10.3)
Chloride: 95 mmol/L — ABNORMAL LOW (ref 98–111)
Creatinine, Ser: 7.08 mg/dL — ABNORMAL HIGH (ref 0.61–1.24)
GFR, Estimated: 7 mL/min — ABNORMAL LOW (ref >60)
Glucose, Bld: 211 mg/dL — ABNORMAL HIGH (ref 70–99)
Potassium: 4.8 mmol/L (ref 3.5–5.1)
Sodium: 136 mmol/L (ref 135–145)
Total Bilirubin: 1.6 mg/dL — ABNORMAL HIGH (ref 0.0–1.2)
Total Protein: 9 g/dL — ABNORMAL HIGH (ref 6.5–8.1)

## 2023-06-07 LAB — TROPONIN I (HIGH SENSITIVITY)
Troponin I (High Sensitivity): 2 ng/L (ref <18)
Troponin I (High Sensitivity): <2 ng/L (ref <18)

## 2023-06-07 MED ORDER — CEFTRIAXONE SODIUM 1 G IJ SOLR
1.0000 g | Freq: Once | INTRAMUSCULAR | Status: AC
  Administered 2023-06-07: 1 g via INTRAVENOUS
  Filled 2023-06-07: qty 10

## 2023-06-07 MED ORDER — ALUM & MAG HYDROXIDE-SIMETH 200-200-20 MG/5ML PO SUSP
30.0000 mL | Freq: Once | ORAL | Status: AC
  Administered 2023-06-07: 30 mL via ORAL
  Filled 2023-06-07: qty 30

## 2023-06-07 MED ORDER — CEPHALEXIN 500 MG PO CAPS: 500.0000 mg | ORAL_CAPSULE | Freq: Two times a day (BID) | ORAL | 0 refills | Status: DC

## 2023-06-07 NOTE — Discharge Instructions (Signed)
 Please take the antibiotic as prescribed and follow-up with your doctor.  Take Tylenol at home for pain.  Follow-up on Wednesday for your regularly scheduled dialysis.  You may start the antibiotics tomorrow as the antibiotic you received today should cover you for today.

## 2023-06-07 NOTE — ED Provider Notes (Signed)
 [place] EMERGENCY DEPARTMENT AT [facility] Provider Note   CSN: [id number] Arrival date & time: 06/07/23  8295     History  Chief Complaint  Patient presents with   Abdominal Pain    Trevor [name] is a 83 y.o. male.  83 year old male with past medical history of hypertension, hyperlipidemia, and end-stage renal disease presenting to the emergency department today with right-sided flank pain and abdominal pain.  The patient states that this started this morning.  He states that he was at dialysis and this seemed to start just before he left home.  He denies any associated nausea or vomiting currently.  He states he did have some mild nausea earlier that resolved with Zofran and he was given with medics.  The patient reports normal bowel movements with this.  He is somewhat of a poor historian.  He denies any associated chest pain.  He states the pain is mostly over the right flank and epigastrium.  He did have 1 hour of dialysis and due to the symptoms he was sent to the ER for further evaluation.   Abdominal Pain      Home Medications Prior to Admission medications   Medication Sig Start Date End Date Taking? Authorizing Provider  cephALEXin (KEFLEX) 500 MG capsule Take 1 capsule (500 mg total) by mouth 2 (two) times daily. 06/07/23  Yes Durwin Glaze, MD  ACCU-CHEK AVIVA PLUS test strip USE TO TEST BLOOD SUGAR BEFORE BREAKFAST AND AT BEDTIME 05/26/21   Roma Kayser, MD  Accu-Chek Softclix Lancets lancets  09/13/19   [provider]  acetaminophen (TYLENOL) 325 MG tablet Take 1-2 tablets (325-650 mg total) by mouth every 6 (six) hours as needed for mild pain (pain score 1-3 or temp > 100.5). 07/28/22   Meredeth Ide, MD  albuterol (VENTOLIN HFA) 108 (90 Base) MCG/ACT inhaler Inhale 2 puffs every 4 hours by inhalation route. 02/26/23   [provider]  Alcohol Swabs (B-D SINGLE USE SWABS REGULAR) PADS  08/11/19   [provider]  aspirin  EC 81 MG tablet Take 1 tablet (81 mg total) by mouth daily with breakfast. Swallow whole. 08/30/22   Barrett, Joline Salt, PA-C  calcitRIOL (ROCALTROL) 0.25 MCG capsule Take 5 capsules (1.25 mcg total) by mouth every Monday, Wednesday, and Friday with hemodialysis. 02/03/23   Osvaldo Shipper, MD  carvedilol (COREG) 12.5 MG tablet Take 12.5 mg by mouth 2 (two) times daily with a meal. 10/18/19   [provider]  Cholecalciferol (VITAMIN D-3 PO) Take 1 tablet by mouth daily.    [provider]  CIPRO 250 MG tablet Take 1 tablet every day by oral route for 7 days. 02/09/23   [provider]  CRANBERRY EXTRACT PO Take 1 tablet by mouth daily.    [provider]  hydrALAZINE (APRESOLINE) 50 MG tablet Take 50 mg by mouth 2 (two) times daily.    [provider]  insulin degludec (TRESIBA FLEXTOUCH) 200 UNIT/ML FlexTouch Pen 10-24 Units as needed (blood sugar). Sliding scale Under 100 Do Not Take Patient not taking: Reported on 03/04/2023    [provider]  isosorbide mononitrate (IMDUR) 30 MG 24 hr tablet Take 1 tablet (30 mg total) by mouth daily. 02/03/23   Osvaldo Shipper, MD  lactulose (CHRONULAC) 10 GM/15ML solution Take 20 g by mouth daily as needed for moderate constipation. 08/21/22   [provider]  nitroGLYCERIN (NITROSTAT) 0.4 MG SL tablet Place 1 tablet (0.4 mg  total) under the tongue every 5 (five) minutes as needed for chest pain. 08/29/22   Barrett, Joline Salt, PA-C  ondansetron (ZOFRAN) 8 MG tablet     [provider]  rosuvastatin (CRESTOR) 10 MG tablet Take 10 mg by mouth every evening. 01/26/17   [provider]  ticagrelor (BRILINTA) 90 MG TABS tablet Take 1 tablet (90 mg total) by mouth 2 (two) times daily. 04/27/23   Mallipeddi, Orion Modest, MD      Allergies    Patient has no known allergies.    Review of Systems   Review of Systems  Gastrointestinal:  Positive for abdominal pain.  All other systems reviewed  and are negative.   Physical Exam Updated Vital Signs BP (!) 131/103 (BP Location: Right Arm)   Pulse (!) 59   Temp 98.1 F (36.7 C) (Oral)   Resp 16   SpO2 96%  Physical Exam Vitals and nursing note reviewed.   Gen: NAD Eyes: PERRL, EOMI HEENT: no oropharyngeal swelling Neck: trachea midline Resp: clear to auscultation bilaterally Card: The patient is tender over the right upper quadrant, epigastrium, and left upper quadrant, there is no lower abdominal tenderness noted, the patient has right-sided CVA tenderness as well Abd: nontender, nondistended Extremities: no calf tenderness, no edema Vascular: 2+ radial pulses bilaterally, 2+ DP pulses bilaterally Neuro: No focal deficits Skin: no rashes Psyc: acting appropriately   ED Results / Procedures / Treatments   Labs (all labs ordered are listed, but only abnormal results are displayed) Labs Reviewed  CBC WITH DIFFERENTIAL/PLATELET - Abnormal; Notable for the following components:      Result Value   Neutro Abs 7.8 (*)    Lymphs Abs 0.5 (*)    All other components within normal limits  COMPREHENSIVE METABOLIC PANEL - Abnormal; Notable for the following components:   Chloride 95 (*)    Glucose, Bld 211 (*)    BUN 39 (*)    Creatinine, Ser 7.08 (*)    Total Protein 9.0 (*)    AST 111 (*)    ALT 84 (*)    Total Bilirubin 1.6 (*)    GFR, Estimated 7 (*)    All other components within normal limits  LIPASE, BLOOD - Abnormal; Notable for the following components:   Lipase 63 (*)    All other components within normal limits  URINALYSIS, ROUTINE W REFLEX MICROSCOPIC - Abnormal; Notable for the following components:   Color, Urine AMBER (*)    APPearance TURBID (*)    Glucose, UA 150 (*)    Protein, ur 100 (*)    Leukocytes,Ua LARGE (*)    Bacteria, UA MANY (*)    Non Squamous Epithelial 0-5 (*)    All other components within normal limits  TROPONIN I (HIGH SENSITIVITY)  TROPONIN I (HIGH SENSITIVITY)    EKG EKG  Interpretation Date/Time:  Monday June 07 2023 08:27:38 EST Ventricular Rate:  64 PR Interval:  455 QRS Duration:  91 QT Interval:  435 QTC Calculation: 449 R Axis:   38  Text Interpretation: Sinus rhythm Prolonged PR interval Probable left atrial enlargement LVH with secondary repolarization abnormality Confirmed by Beckey Downing [id number]) on 06/07/2023 8:29:21 AM  Radiology CT ABDOMEN PELVIS WO CONTRAST Result Date: 06/07/2023 CLINICAL DATA:  Left flank pain and nausea EXAM: CT ABDOMEN AND PELVIS WITHOUT CONTRAST TECHNIQUE: Multidetector CT imaging of the abdomen and pelvis was performed following the standard protocol without IV contrast. RADIATION DOSE REDUCTION: This exam was performed according  to the departmental dose-optimization program which includes automated exposure control, adjustment of the mA and/or kV according to patient size and/or use of iterative reconstruction technique. COMPARISON:  None Available. FINDINGS: Lower chest: No focal consolidation or pulmonary nodule in the lung bases. No pleural effusion or pneumothorax demonstrated. Partially imaged heart size is normal. Coronary artery calcifications. Hepatobiliary: No focal hepatic lesions. No intra or extrahepatic biliary ductal dilation. Normal gallbladder. Pancreas: No focal lesions or main ductal dilation. Spleen: Normal in size without focal abnormality. Adrenals/Urinary Tract: No adrenal nodules. No suspicious renal masses by noncontrast technique. No hydronephrosis. Nonobstructing left lower pole stone measures 7 mm. Suboptimal evaluation of posterior bladder due to beam hardening artifact from hip arthroplasties. Stomach/Bowel: Rounded density measuring 1.1 x 0.8 cm (2:31) arising from the anterior wall of the gastric antrum. Small focus of fat attenuation within intraluminal left upper quadrant small bowel loops (2:45) may represent a lipoma or ingested intraluminal material. No evidence of bowel wall thickening,  distention, or inflammatory changes. Normal appendix. Vascular/Lymphatic: Aortic atherosclerosis. No enlarged abdominal or pelvic lymph nodes. Reproductive: Suboptimal evaluation of the prostate due to beam hardening artifact from hip arthroplasties. Other: No free fluid, fluid collection, or free air. Musculoskeletal: No acute or abnormal lytic or blastic osseous lesions. Bilateral hip arthroplasties. Small fat-containing left inguinal hernia. IMPRESSION: 1. Nonobstructing left lower pole renal stone measures 7 mm. No hydronephrosis. 2. Otherwise no acute abdominopelvic findings. Specifically, no CT evidence of acute pancreatitis. 3. Rounded density measuring 1.1 x 0.8 cm arising from the anterior wall of the gastric antrum may represent a polyp or adherent food bolus. Consider further evaluation with endoscopy. 4. Small fat-containing left inguinal hernia. 5.  Aortic Atherosclerosis (ICD10-I70.0). Electronically Signed   By: Agustin Cree M.D.   On: 06/07/2023 08:26    Procedures Procedures    Medications Ordered in ED Medications  alum & mag hydroxide-simeth (MAALOX/MYLANTA) 200-200-20 MG/5ML suspension 30 mL (30 mLs Oral Given 06/07/23 0809)  cefTRIAXone (ROCEPHIN) 1 g in sodium chloride 0.9 % 100 mL IVPB (1 g Intravenous New Bag/Given 06/07/23 1313)    ED Course/ Medical Decision Making/ A&P                                 Medical Decision Making 83 year old male with past medical history of diabetes, hypertension, hyperlipidemia, and end-stage renal disease presenting to the emergency department today with upper abdominal and flank pain.  I will further evaluate the patient here with basic labs including LFTs and lipase to evaluate for hepatobiliary pathology or pancreatitis.  Also obtain EKG and troponin to evaluate for atypical ACS given the fact this is mostly in the upper abdomen.  The patient is already seen medications with medics and is feeling little bit better.  Will give him a GI cocktail  here in the event that this is due to GERD or gastritis.  I will obtain a CT scan to further evaluate for ureterolithiasis, bowel obstruction, hepatobiliary pathology, or other intra-abdominal pathology.  I will reevaluate for ultimate disposition.  The patient's work appears reassuring.  Urinalysis does show findings concerning for infection.  I did call and discussed this case with nephrology and with him not requiring oxygen and with labs being stable recommended that he follow-up on Wednesday for regularly scheduled dialysis.  The patient is discharged with antibiotics with return precautions.  These are renally dosed for dialysis for pyelonephritis.  Amount and/or Complexity  of Data Reviewed Labs: ordered. Radiology: ordered.  Risk OTC drugs. Prescription drug management.           Final Clinical Impression(s) / ED Diagnoses Final diagnoses:  Pyelonephritis    Rx / DC Orders ED Discharge Orders          Ordered    cephALEXin (KEFLEX) 500 MG capsule  2 times daily        06/07/23 1408              Durwin Glaze, MD 06/07/23 1409

## 2023-06-07 NOTE — ED Triage Notes (Signed)
 Pt BIB [facility] from dialysis for c/o abdominal and flank pain  Pt states last night he started to have left flank pain that has moved to left upper abdomin this am  Pt was at dialysis when the abdominal pain started, pt was given zofran 4mg  by dialysis at 0550  Pt cbg 163

## 2023-06-08 ENCOUNTER — Encounter (HOSPITAL_COMMUNITY): Payer: Medicare HMO

## 2023-06-09 DIAGNOSIS — Z992 Dependence on renal dialysis: Secondary | ICD-10-CM | POA: Diagnosis not present

## 2023-06-09 DIAGNOSIS — N186 End stage renal disease: Secondary | ICD-10-CM | POA: Diagnosis not present

## 2023-06-10 ENCOUNTER — Encounter (HOSPITAL_COMMUNITY): Payer: Medicare HMO

## 2023-06-11 DIAGNOSIS — Z992 Dependence on renal dialysis: Secondary | ICD-10-CM | POA: Diagnosis not present

## 2023-06-11 DIAGNOSIS — N186 End stage renal disease: Secondary | ICD-10-CM | POA: Diagnosis not present

## 2023-06-12 DIAGNOSIS — Z992 Dependence on renal dialysis: Secondary | ICD-10-CM | POA: Diagnosis not present

## 2023-06-12 DIAGNOSIS — N186 End stage renal disease: Secondary | ICD-10-CM | POA: Diagnosis not present

## 2023-06-14 DIAGNOSIS — N186 End stage renal disease: Secondary | ICD-10-CM | POA: Diagnosis not present

## 2023-06-14 DIAGNOSIS — Z992 Dependence on renal dialysis: Secondary | ICD-10-CM | POA: Diagnosis not present

## 2023-06-15 ENCOUNTER — Telehealth: Payer: Self-pay | Admitting: *Deleted

## 2023-06-15 ENCOUNTER — Encounter (HOSPITAL_COMMUNITY): Payer: Medicare HMO

## 2023-06-15 ENCOUNTER — Encounter: Payer: Self-pay | Admitting: *Deleted

## 2023-06-15 DIAGNOSIS — H524 Presbyopia: Secondary | ICD-10-CM | POA: Diagnosis not present

## 2023-06-15 DIAGNOSIS — H35033 Hypertensive retinopathy, bilateral: Secondary | ICD-10-CM | POA: Diagnosis not present

## 2023-06-15 NOTE — Progress Notes (Signed)
 Complex Care Management Care Guide Note  06/15/2023 Name: [name] MRN: [id number] DOB: [birth year]  Trevor [name] is a 83 y.o. year old male who is a primary care patient of [name], [name], MD and is actively engaged with the care management team. I reached out to [name] by phone today to assist with re-scheduling  with the RN Case Manager.  Follow up plan: Unsuccessful telephone outreach attempt made.   Gwenevere Ghazi  [facility]  Value-Based Care Institute, [facility] Guide  Direct Dial: [phone]  Fax [phone]

## 2023-06-16 DIAGNOSIS — N186 End stage renal disease: Secondary | ICD-10-CM | POA: Diagnosis not present

## 2023-06-16 DIAGNOSIS — Z992 Dependence on renal dialysis: Secondary | ICD-10-CM | POA: Diagnosis not present

## 2023-06-17 ENCOUNTER — Telehealth: Payer: Self-pay

## 2023-06-17 ENCOUNTER — Telehealth (HOSPITAL_COMMUNITY): Payer: Self-pay | Admitting: *Deleted

## 2023-06-17 ENCOUNTER — Encounter (HOSPITAL_COMMUNITY): Payer: Medicare HMO

## 2023-06-17 NOTE — Telephone Encounter (Signed)
 Attempted to call for surgery scheduling. Left message with wife.

## 2023-06-17 NOTE — Telephone Encounter (Signed)
 Received fax from [facility] requesting evaluation due to prolonged bleeding s/p dialysis.  Will give to [facility] and scan fax into media.

## 2023-06-18 DIAGNOSIS — N186 End stage renal disease: Secondary | ICD-10-CM | POA: Diagnosis not present

## 2023-06-18 DIAGNOSIS — Z992 Dependence on renal dialysis: Secondary | ICD-10-CM | POA: Diagnosis not present

## 2023-06-21 DIAGNOSIS — Z992 Dependence on renal dialysis: Secondary | ICD-10-CM | POA: Diagnosis not present

## 2023-06-21 DIAGNOSIS — N186 End stage renal disease: Secondary | ICD-10-CM | POA: Diagnosis not present

## 2023-06-21 NOTE — Progress Notes (Signed)
 Complex Care Management Care Guide Note  06/21/2023 Name: Trevor [name] MRN: [id number] DOB: 1940-07-26  Trevor [name] is a 83 y.o. year old male who is a primary care patient of [name], [name], MD and is actively engaged with the care management team. I reached out to [name] by phone today to assist with re-scheduling  with the RN Case Manager.  Follow up plan: Unsuccessful telephone outreach attempt made. A HIPAA compliant phone message was left for the patient providing contact information and requesting a return call.  Gwenevere Ghazi  [facility]  Value-Based Care Institute, [facility] Guide  Direct Dial: [phone]  Fax [phone]

## 2023-06-22 ENCOUNTER — Other Ambulatory Visit: Payer: Self-pay

## 2023-06-22 ENCOUNTER — Telehealth: Payer: Self-pay

## 2023-06-22 ENCOUNTER — Encounter (HOSPITAL_COMMUNITY): Payer: Medicare HMO

## 2023-06-22 DIAGNOSIS — N186 End stage renal disease: Secondary | ICD-10-CM

## 2023-06-22 MED ORDER — SODIUM CHLORIDE 0.9% FLUSH: 3.0000 mL | INTRAVENOUS | Status: AC | PRN

## 2023-06-22 NOTE — Telephone Encounter (Signed)
 Attempted to call for surgery scheduling. LVM with wife.

## 2023-06-23 DIAGNOSIS — N186 End stage renal disease: Secondary | ICD-10-CM | POA: Diagnosis not present

## 2023-06-23 DIAGNOSIS — Z992 Dependence on renal dialysis: Secondary | ICD-10-CM | POA: Diagnosis not present

## 2023-06-24 ENCOUNTER — Telehealth: Payer: Self-pay | Admitting: *Deleted

## 2023-06-24 ENCOUNTER — Encounter (HOSPITAL_COMMUNITY): Payer: Medicare HMO

## 2023-06-24 NOTE — Telephone Encounter (Signed)
 Received fax from [facility] pharmacy stating -   RX for Brilinta 90mg  tab and patient has disease contraindication risk:  hemorrhage.  Clarify if ok to continue to fill the Brilinta 90mg  at this dose.

## 2023-06-24 NOTE — Telephone Encounter (Signed)
 Voice mail is full

## 2023-06-25 DIAGNOSIS — Z992 Dependence on renal dialysis: Secondary | ICD-10-CM | POA: Diagnosis not present

## 2023-06-25 DIAGNOSIS — N186 End stage renal disease: Secondary | ICD-10-CM | POA: Diagnosis not present

## 2023-06-28 ENCOUNTER — Other Ambulatory Visit: Payer: Self-pay | Admitting: Internal Medicine

## 2023-06-28 ENCOUNTER — Telehealth: Payer: Self-pay | Admitting: Internal Medicine

## 2023-06-28 DIAGNOSIS — N186 End stage renal disease: Secondary | ICD-10-CM | POA: Diagnosis not present

## 2023-06-28 DIAGNOSIS — Z992 Dependence on renal dialysis: Secondary | ICD-10-CM | POA: Diagnosis not present

## 2023-06-28 MED ORDER — TICAGRELOR 90 MG PO TABS: 90.0000 mg | ORAL_TABLET | Freq: Two times a day (BID) | ORAL | 1 refills | Status: DC

## 2023-06-28 NOTE — Telephone Encounter (Signed)
 Pt c/o medication issue:  1. Name of Medication:   ticagrelor (BRILINTA) 90 MG TABS tablet   2. How are you currently taking this medication (dosage and times per day)?   As prescribed  3. Are you having a reaction (difficulty breathing--STAT)?   4. What is your medication issue?   Patient stated he has been out of this medication since Saturday (3/15) and his insurance-[employer] will no longer cover this medication.  Patient stated his insurance will cover Clopidogrel and he wants a prescription called in to [facility] - [place], [place] - [address], ph [phone].  Patient wants 10 tablets sent to [id number] - [place], [place] - [address]

## 2023-06-29 ENCOUNTER — Encounter (HOSPITAL_COMMUNITY): Payer: Medicare HMO

## 2023-06-30 ENCOUNTER — Encounter: Payer: Self-pay | Admitting: *Deleted

## 2023-06-30 ENCOUNTER — Ambulatory Visit: Payer: Self-pay | Admitting: *Deleted

## 2023-06-30 ENCOUNTER — Telehealth: Payer: Self-pay

## 2023-06-30 DIAGNOSIS — Z992 Dependence on renal dialysis: Secondary | ICD-10-CM | POA: Diagnosis not present

## 2023-06-30 DIAGNOSIS — N186 End stage renal disease: Secondary | ICD-10-CM | POA: Diagnosis not present

## 2023-06-30 NOTE — Telephone Encounter (Signed)
 [facility] Nurse Care Manager called office inquiring about Brilinta for patient. Stated medication was very expensive for patient. Informed nurse manager patient can fill out PAF for Brilinta and bring in a copy of proof of income and oop medication list from pharmacy. Also stated, per Dr. Antoine Poche last telephone note, that if Brilinta was unobtainable, could switch to Plavix. Nurse manager thankful for information provided

## 2023-06-30 NOTE — Progress Notes (Signed)
 Complex Care Management Care Guide Note  06/30/2023 Name: Trevor [name] MRN: [id number] DOB: [birth year]  Trevor [name] is a 83 y.o. year old male who is a primary care patient of [name], [name], MD and is actively engaged with the care management team. I reached out to [name] by phone today to assist with scheduling  with the RN Case Manager.  Follow up plan: Telephone appointment with complex care management team member scheduled for:  3/25  06/30/23 9:00 am Patient Trevor [name], Trevor [name], inquired about a follow up regarding changing Brilinta to preferred drug of choice Plavix  in generic form as requested by his insurance [employer]. Message forwarded to Edd Arbour  [facility] for care management need.    Gwenevere Ghazi  [facility]  Value-Based Care Institute, [facility] Guide  Direct Dial: [phone]  Fax [phone]

## 2023-06-30 NOTE — Telephone Encounter (Signed)
 Patient wanted reminder of which medications he may take tomorrow prior to surgical procedure.  Reminded him that he may take his morning medicine with a sip of water.  Patient had no other questions.

## 2023-06-30 NOTE — Patient Outreach (Signed)
 Care Coordination   Collaboration with cardiology office  Visit Note   07/05/2023 updated note for 06/30/23 Name: BRINDEN KINCHELOE MRN: [id number] DOB: [birth year]  WEBB WEED is a 83 y.o. year old male who sees [name], [name], MD for primary care. I  received a message from CMA, Melody Comas , outreached to Dr Jenene Slicker office to collaborate with the nurse, Morrie Sheldon  What matters to the patients health and wellness today?  Obtaining Brilinta  Will need proof income, pharmacy out of pocket for med met 3% out of pocket Spoke with ashley in absence of [facility] LPN Patient reports he has tried to call cardiology since Monday  He reports being without Brilinta for 4 days [facility] provided 10 Brilinta pills for $75 but gave out on Saturday 06/26/23 He was informed the cost would be $381 for 90 days    He reports mobile phone issues with a Mail box full & he can not access with a password  Provided Dr Jenene Slicker office number for follow up as [phone]   Pending AV fistulogram on 07/01/23 He reports he is not clear on which medicines to take prior to this procedure. Confirms a call from a nurse to review his medicines but states he was not informed on which medicines to not take. He generally has aspirin and Brilinta.   At dialysis at the time of the call Wants Plavix called in to [facility] for a few and then Rx to his [facility] mail order as it will take 3-5 days  Return call to [facility] RN to have Rx sent to [facility] [employer] mail order delivery company  Sent patient online site links to his [facility] 2025 formulary & the Brilinta patient assistance program to his mobile text messages as discussed   Goals Addressed             This Visit's Progress    Obtain cost efficient anticoagulant "better control of my health", (CHF, ESRD, DM) (RN CM)       Interventions Today    Flowsheet Row Most Recent Value  Chronic Disease   Chronic disease during today's visit Other  [medication Brillinta]  General  Interventions   General Interventions Discussed/Reviewed General Interventions Reviewed, Communication with  Doctor Visits Discussed/Reviewed Doctor Visits Reviewed, PCP, Specialist  PCP/Specialist Visits Compliance with follow-up visit  Communication with PCP/Specialists, RN  Nicholes Stairs to cardiology office to collaborate with staff about Brillinta]  Education Interventions   Education Provided Provided Education  [Brilinita, annual insurance formulary & deductibles, patient medication assistance programs & application, what is needed to complete applications, options for phone improvements. pre op care for av fistula]  Provided Verbal Education On Medication, [employer], [employer], Other  [Called with him to Dr Sherald Hess to leave a message for pre-op cal]  Pharmacy Interventions   Pharmacy Dicussed/Reviewed Pharmacy Topics Reviewed, Affording Medications  Medication Adherence Unable to refill medication  Referral to Pharmacist Cannot afford medications              SDOH assessments and interventions completed:  No     Care Coordination Interventions:  Yes, provided   Follow up plan: Follow up call scheduled for 07/06/23    Encounter Outcome:  Patient Visit Completed   Cala Bradford L. Noelle Penner, RN, BSN, CCM [place]  Value Based Care Institute, [facility] RN Care Manager Direct Dial: [phone]  Fax: [phone]

## 2023-07-01 ENCOUNTER — Ambulatory Visit (HOSPITAL_COMMUNITY)
Admission: RE | Admit: 2023-07-01 | Discharge: 2023-07-01 | Disposition: A | Discharge status: Home / Self Care | Attending: Vascular Surgery | Admitting: Vascular Surgery

## 2023-07-01 ENCOUNTER — Other Ambulatory Visit: Payer: Self-pay

## 2023-07-01 ENCOUNTER — Encounter (HOSPITAL_COMMUNITY)
Admission: RE | Disposition: A | Discharge status: Home / Self Care | Payer: Self-pay | Source: Home / Self Care | Attending: Vascular Surgery

## 2023-07-01 ENCOUNTER — Encounter (HOSPITAL_COMMUNITY): Payer: Medicare HMO

## 2023-07-01 DIAGNOSIS — Y832 Surgical operation with anastomosis, bypass or graft as the cause of abnormal reaction of the patient, or of later complication, without mention of misadventure at the time of the procedure: Secondary | ICD-10-CM | POA: Insufficient documentation

## 2023-07-01 DIAGNOSIS — Z794 Long term (current) use of insulin: Secondary | ICD-10-CM | POA: Diagnosis not present

## 2023-07-01 DIAGNOSIS — N185 Chronic kidney disease, stage 5: Secondary | ICD-10-CM | POA: Diagnosis not present

## 2023-07-01 DIAGNOSIS — T82838A Hemorrhage of vascular prosthetic devices, implants and grafts, initial encounter: Secondary | ICD-10-CM | POA: Diagnosis not present

## 2023-07-01 DIAGNOSIS — E1122 Type 2 diabetes mellitus with diabetic chronic kidney disease: Secondary | ICD-10-CM | POA: Insufficient documentation

## 2023-07-01 DIAGNOSIS — Z992 Dependence on renal dialysis: Secondary | ICD-10-CM | POA: Insufficient documentation

## 2023-07-01 DIAGNOSIS — Z79899 Other long term (current) drug therapy: Secondary | ICD-10-CM | POA: Diagnosis not present

## 2023-07-01 DIAGNOSIS — T82858A Stenosis of vascular prosthetic devices, implants and grafts, initial encounter: Secondary | ICD-10-CM | POA: Insufficient documentation

## 2023-07-01 DIAGNOSIS — E785 Hyperlipidemia, unspecified: Secondary | ICD-10-CM | POA: Diagnosis not present

## 2023-07-01 DIAGNOSIS — N186 End stage renal disease: Secondary | ICD-10-CM | POA: Diagnosis not present

## 2023-07-01 DIAGNOSIS — I120 Hypertensive chronic kidney disease with stage 5 chronic kidney disease or end stage renal disease: Secondary | ICD-10-CM | POA: Insufficient documentation

## 2023-07-01 HISTORY — PX: A/V FISTULAGRAM: CATH118298

## 2023-07-01 LAB — POCT I-STAT, CHEM 8
BUN: 30 mg/dL — ABNORMAL HIGH (ref 8–23)
Calcium, Ion: 1.17 mmol/L (ref 1.15–1.40)
Chloride: 101 mmol/L (ref 98–111)
Creatinine, Ser: 7.7 mg/dL — ABNORMAL HIGH (ref 0.61–1.24)
Glucose, Bld: 120 mg/dL — ABNORMAL HIGH (ref 70–99)
HCT: 38 % — ABNORMAL LOW (ref 39.0–52.0)
Hemoglobin: 12.9 g/dL — ABNORMAL LOW (ref 13.0–17.0)
Potassium: 4.5 mmol/L (ref 3.5–5.1)
Sodium: 137 mmol/L (ref 135–145)
TCO2: 28 mmol/L (ref 22–32)

## 2023-07-01 SURGERY — A/V FISTULAGRAM
Anesthesia: LOCAL

## 2023-07-01 MED ORDER — HEPARIN SODIUM (PORCINE) 1000 UNIT/ML IJ SOLN
INTRAMUSCULAR | Status: DC | PRN
  Administered 2023-07-01: 5000 [IU] via INTRAVENOUS

## 2023-07-01 MED ORDER — HEPARIN SODIUM (PORCINE) 1000 UNIT/ML IJ SOLN
INTRAMUSCULAR | Status: AC
  Filled 2023-07-01: qty 10

## 2023-07-01 MED ORDER — LIDOCAINE HCL (PF) 1 % IJ SOLN
INTRAMUSCULAR | Status: AC
  Filled 2023-07-01: qty 30

## 2023-07-01 MED ORDER — IODIXANOL 320 MG/ML IV SOLN
INTRAVENOUS | Status: DC | PRN
  Administered 2023-07-01: 40 mL

## 2023-07-01 MED ORDER — HEPARIN (PORCINE) IN NACL 1000-0.9 UT/500ML-% IV SOLN
INTRAVENOUS | Status: DC | PRN
  Administered 2023-07-01: 500 mL

## 2023-07-01 MED ORDER — LIDOCAINE HCL (PF) 1 % IJ SOLN
INTRAMUSCULAR | Status: DC | PRN
  Administered 2023-07-01: 5 mL

## 2023-07-01 SURGICAL SUPPLY — 13 items
BALLN LUTONIX AV 8X40X75 (BALLOONS) ×1 IMPLANT
BALLN MUSTANG 7X80X75 (BALLOONS) ×1 IMPLANT
BALLOON LUTONIX AV 8X40X75 (BALLOONS) IMPLANT
BALLOON MUSTANG 7X80X75 (BALLOONS) IMPLANT
COVER DOME SNAP 22 D (MISCELLANEOUS) ×1 IMPLANT
KIT ENCORE 26 ADVANTAGE (KITS) IMPLANT
KIT MICROPUNCTURE NIT STIFF (SHEATH) IMPLANT
SHEATH PINNACLE R/O II 7F 4CM (SHEATH) IMPLANT
SHEATH PROBE COVER 6X72 (BAG) ×1 IMPLANT
STOPCOCK MORSE 400PSI 3WAY (MISCELLANEOUS) ×1 IMPLANT
TRAY PV CATH (CUSTOM PROCEDURE TRAY) ×1 IMPLANT
TUBING CIL FLEX 10 FLL-RA (TUBING) ×1 IMPLANT
WIRE BENTSON .035X145CM (WIRE) IMPLANT

## 2023-07-01 NOTE — Op Note (Signed)
    OPERATIVE NOTE   DATE: July 01, 2023  PROCEDURE: left upper arm arteriovenous graft cannulation under ultrasound guidance left arm fistulogram including central venogram left peripheral angioplasty of AV graft (7 mm x 80 mm Mustang and 8 mm x 40 mm drug-coated Lutonix)  PRE-OPERATIVE DIAGNOSIS: Malfunctioning left arteriovenous graft (prolonged bleeding)  POST-OPERATIVE DIAGNOSIS: same as above   SURGEON: Cephus Shelling, MD  ANESTHESIA: local  ESTIMATED BLOOD LOSS: 5 cc  FINDING(S): Left upper arm AV graft was accessed.  No evidence of central venous stenosis.  There is a stent in the proximal upper arm basilic vein where the graft is sewn that is widely patent.  There was 80% stenosis in the mid arm AV graft that was treated with a 7 mm Mustang and then 8 mm drug-coated Lutonix.  Widely patent at completion.  Much better thrill.  SPECIMEN(S):  None  CONTRAST: 40 mL  INDICATIONS: Trevor [name] is a 83 y.o. male who  presents with malfunctioning left arm arteriovenous graft.  The patient is scheduled for left arm fistulogram.  The patient is aware the risks include but are not limited to: bleeding, infection, thrombosis of the cannulated access, and possible anaphylactic reaction to the contrast.  The patient is aware of the risks of the procedure and elects to proceed forward.  DESCRIPTION: After full informed written consent was obtained, the patient was brought back to the angiography suite and placed supine upon the angiography table.  The patient was connected to monitoring equipment.  The left arm was prepped and draped in the standard fashion for a left arm fistulogram.  Under ultrasound guidance, the left arm arteriovenous graft was evaluated, it was patent, an image was saved.  It was cannulated with a micropuncture needle.  The microwire was advanced into the fistula and the needle was exchanged for the a microsheath, which was lodged 2 cm into the access.  The  wire was removed and the sheath was connected to the IV extension tubing.  Hand injections were completed to image the access from the antecubitum up to the level of axilla.  The central venous structures were also imaged by hand injections.  Ultimately elected for intervention.  I then used a Bentson wire and exchanged for a short 7 French sheath in the AV graft.  Patient was given 5000 units of IV heparin.  I then treated the stenosis in the mid upper arm graft with a 7 mm x 80 mm Mustang to nominal pressure for 2 minutes.  I did inflate this in the stent as well although this was widely patent.  I then treated the mid upper arm AV graft stenosis with a 8 mm x 40 mm drug-coated Lutonix for 3 minutes.  Widely patent at completion.  A 4-0 Monocryl purse-string suture was sewn around the sheath.  The sheath was removed while tying down the suture.  A sterile bandage was applied to the puncture site.  COMPLICATIONS: None  CONDITION: Stable  Cephus Shelling, MD [facility] Office: [phone]  Cephus Shelling   07/01/2023 8:41 AM

## 2023-07-01 NOTE — H&P (Signed)
 H&P    Reason for Consult: Left arm fistulogram  MRN #:  [id number]  History of Present Illness: This is a 83 y.o. male with end-stage renal disease as well as hypertension hyperlipidemia and diabetes that presents for left arm fistulogram.  Previously had a left arm brachial artery to basilic vein AV graft creation with PTFE on 12/17/2020 by Dr. Randie Heinz.  Patient states had prolonged bleeding from the graft.  Past Medical History:  Diagnosis Date   Anemia    Chronic kidney disease    Stage 4?    Diabetes mellitus without complication (HCC)    Hepatitis    not sure what type - over 20 years ago   History of kidney stones    Hyperlipidemia    Hypertension     Past Surgical History:  Procedure Laterality Date   AV FISTULA PLACEMENT Left 08/09/2019   Procedure: LEFT ARM Basilic  ARTERIOVENOUS  FISTULA CREATION;  Surgeon: Maeola Harman, MD;  Location: [facility] OR;  Service: Vascular;  Laterality: Left;   AV FISTULA PLACEMENT Left 12/17/2020   Procedure: INSERTION OF ARTERIOVENOUS (AV) GORE-TEX GRAFT LEFT ARM;  Surgeon: Maeola Harman, MD;  Location: [facility] OR;  Service: Vascular;  Laterality: Left;   BASCILIC VEIN TRANSPOSITION Left 11/30/2019   Procedure: LEFT SECOND STAGE BASCILIC VEIN TRANSPOSITION;  Surgeon: Maeola Harman, MD;  Location: [facility] OR;  Service: Vascular;  Laterality: Left;   COLONOSCOPY N/A 07/07/2017   Procedure: COLONOSCOPY;  Surgeon: West Bali, MD;  Location: [facility];  Service: Endoscopy;  Laterality: N/A;  8:30   CORONARY ATHERECTOMY N/A 08/28/2022   Procedure: CORONARY ATHERECTOMY;  Surgeon: Orbie Pyo, MD;  Location: [facility] INVASIVE CV LAB;  Service: Cardiovascular;  Laterality: N/A;   CORONARY IMAGING/OCT N/A 08/28/2022   Procedure: CORONARY IMAGING/OCT;  Surgeon: Orbie Pyo, MD;  [facility]: [facility];  Service: Cardiovascular;  Laterality: N/A;   CORONARY LITHOTRIPSY N/A 02/02/2023   Procedure: CORONARY LITHOTRIPSY;   Surgeon: Elder Negus, MD;  [facility]: [facility];  Service: Cardiovascular;  Laterality: N/A;   CORONARY PRESSURE/FFR STUDY N/A 08/28/2022   Procedure: CORONARY PRESSURE/FFR STUDY;  Surgeon: Orbie Pyo, MD;  [facility];  Service: Cardiovascular;  Laterality: N/A;   CORONARY STENT INTERVENTION N/A 08/28/2022   Procedure: CORONARY STENT INTERVENTION;  Surgeon: Orbie Pyo, MD;  [facility];  Service: Cardiovascular;  Laterality: N/A;   CORONARY STENT INTERVENTION N/A 02/02/2023   Procedure: CORONARY STENT INTERVENTION;  Surgeon: Elder Negus, MD;  [facility];  Service: Cardiovascular;  Laterality: N/A;   CORONARY ULTRASOUND/IVUS N/A 02/02/2023   Procedure: Coronary Ultrasound/IVUS;  Surgeon: Elder Negus, MD;  [facility];  Service: Cardiovascular;  Laterality: N/A;   IR FLUORO GUIDE CV LINE RIGHT  09/06/2020   IR US GUIDE VASC ACCESS RIGHT  09/06/2020   LEFT HEART CATH AND CORONARY ANGIOGRAPHY N/A 08/28/2022   Procedure: LEFT HEART CATH AND CORONARY ANGIOGRAPHY;  Surgeon: Orbie Pyo, MD;  [facility];  Service: Cardiovascular;  Laterality: N/A;   LEFT HEART CATH AND CORONARY ANGIOGRAPHY N/A 02/02/2023   Procedure: LEFT HEART CATH AND CORONARY ANGIOGRAPHY;  Surgeon: Elder Negus, MD;  [facility];  Service: Cardiovascular;  Laterality: N/A;   TOTAL HIP ARTHROPLASTY Right 10/09/2020   Procedure: TOTAL HIP ARTHROPLASTY ANTERIOR APPROACH;  Surgeon: [name], MD;  [facility] OR;  Service: Orthopedics;  Laterality: Right;   TOTAL HIP ARTHROPLASTY Left 07/23/2022   Procedure: TOTAL HIP ARTHROPLASTY ANTERIOR APPROACH;  Surgeon: [name], MD;  Location: [facility] OR;  Service: Orthopedics;  Laterality: Left;    No Known Allergies  Prior to Admission medications   Medication Sig Start Date End Date Taking? Authorizing Provider  acetaminophen  (TYLENOL) 325 MG tablet Take 1-2 tablets (325-650 mg total) by mouth every 6 (six) hours as needed for mild pain (pain score 1-3 or temp > 100.5). 07/28/22  Yes Meredeth Ide, MD  aspirin EC 81 MG tablet Take 1 tablet (81 mg total) by mouth daily with breakfast. Swallow whole. 08/30/22  Yes Barrett, Joline Salt, PA-C  carvedilol (COREG) 12.5 MG tablet Take 12.5 mg by mouth 2 (two) times daily with a meal. 10/18/19  Yes [provider]  Cholecalciferol (VITAMIN D-3 PO) Take 1 tablet by mouth daily.   Yes [provider]  CRANBERRY EXTRACT PO Take 1 capsule by mouth daily.   Yes [provider]  hydrALAZINE (APRESOLINE) 50 MG tablet Take 50 mg by mouth 2 (two) times daily.   Yes [provider]  insulin degludec (TRESIBA FLEXTOUCH) 200 UNIT/ML FlexTouch Pen Inject 10-24 Units into the skin daily as needed (high blood sugar).   Yes [provider]  lactulose (CHRONULAC) 10 GM/15ML solution Take 20 g by mouth daily as needed for moderate constipation. 08/21/22  Yes [provider]  nitroGLYCERIN (NITROSTAT) 0.4 MG SL tablet Place 1 tablet (0.4 mg total) under the tongue every 5 (five) minutes as needed for chest pain. 08/29/22  Yes Barrett, Joline Salt, PA-C  rosuvastatin (CRESTOR) 10 MG tablet Take 10 mg by mouth every evening. 01/26/17  Yes [provider]  ACCU-CHEK AVIVA PLUS test strip USE TO TEST BLOOD SUGAR BEFORE BREAKFAST AND AT BEDTIME 05/26/21   Roma Kayser, MD  Accu-Chek Softclix Lancets lancets  09/13/19   [provider]  Alcohol Swabs (B-D SINGLE USE SWABS REGULAR) PADS  08/11/19   [provider]  cephALEXin (KEFLEX) 500 MG capsule Take 1 capsule (500 mg total) by mouth 2 (two) times daily. Patient not taking: Reported on 06/28/2023 06/07/23   Durwin Glaze, MD  ticagrelor (BRILINTA) 90 MG TABS tablet Take 1 tablet (90 mg total) by mouth 2 (two) times daily. Patient not taking: Reported on 06/28/2023 06/28/23    Mallipeddi, Orion Modest, MD    Social History   Socioeconomic History   Marital status: Married    Spouse name: Ernestine   Number of children: Not on file   Years of education: Not on file   Highest education level: Not on file  Occupational History   Not on file  Tobacco Use   Smoking status: Never   Smokeless tobacco: Never  Vaping Use   Vaping status: Never Used  Substance and Sexual Activity   Alcohol use: Never   Drug use: Never   Sexual activity: Not on file  Other Topics Concern   Not on file  Social History Narrative   Not on file   Social Drivers of Health   Financial Resource Strain: Not on file  Food Insecurity: No Food Insecurity (02/02/2023)   Hunger Vital Sign    Worried About Running Out of Food in the Last Year: Never true    Ran Out of Food in the Last Year: Never true  Transportation Needs: No Transportation Needs (08/28/2022)   PRAPARE - Transportation    Lack of  Transportation (Medical): No    Lack of Transportation (Non-Medical): No  Physical Activity: Not on file  Stress: Not on file  Social Connections: Not on file  Intimate Partner Violence: Not At Risk (02/02/2023)   Humiliation, Afraid, Rape, and Kick questionnaire    Fear of Current or Ex-Partner: No    Emotionally Abused: No    Physically Abused: No    Sexually Abused: No     Family History  Problem Relation Age of Onset   Diabetes Mother    Diabetes Brother    Heart attack Brother    Colon cancer Son    Lung cancer Son     ROS: [x]  Positive   [ ]  Negative   [ ]  All sytems reviewed and are negative  Cardiovascular: []  chest pain/pressure []  palpitations []  SOB lying flat []  DOE []  pain in legs while walking []  pain in legs at rest []  pain in legs at night []  non-healing ulcers []  hx of DVT []  swelling in legs  Pulmonary: []  productive cough []  asthma/wheezing []  home O2  Neurologic: []  weakness in []  arms []  legs []  numbness in []  arms []  legs []  hx of CVA []   mini stroke [] difficulty speaking or slurred speech []  temporary loss of vision in one eye []  dizziness  Hematologic: []  hx of cancer []  bleeding problems []  problems with blood clotting easily  Endocrine:   []  diabetes []  thyroid disease  GI []  vomiting blood []  blood in stool  GU: []  CKD/renal failure []  HD--[]  M/W/F or []  T/T/S []  burning with urination []  blood in urine  Psychiatric: []  anxiety []  depression  Musculoskeletal: []  arthritis []  joint pain  Integumentary: []  rashes []  ulcers  Constitutional: []  fever []  chills   Physical Examination  Vitals:   07/01/23 0530  BP: (!) 153/56  Pulse: 60  Resp: 16  Temp: 98 F (36.7 C)  SpO2: 97%   Body mass index is 26.36 kg/m.  General:  WDWN in NAD Gait: Not observed HENT: WNL, normocephalic Pulmonary: normal non-labored breathing Cardiac: regular, without  Murmurs, rubs or gallops Abdomen:  soft, NT/ND, no masses Vascular Exam/Pulses: Left arm AVG with thrill  CBC    Component Value Date/Time   WBC 9.1 06/07/2023 0748   RBC 4.62 06/07/2023 0748   HGB 12.9 (L) 07/01/2023 0549   HCT 38.0 (L) 07/01/2023 0549   PLT 202 06/07/2023 0748   MCV 96.5 06/07/2023 0748   MCH 30.7 06/07/2023 0748   MCHC 31.8 06/07/2023 0748   RDW 15.4 06/07/2023 0748   LYMPHSABS 0.5 (L) 06/07/2023 0748   MONOABS 0.5 06/07/2023 0748   EOSABS 0.3 06/07/2023 0748   BASOSABS 0.0 06/07/2023 0748    BMET    Component Value Date/Time   NA 137 07/01/2023 0549   K 4.5 07/01/2023 0549   CL 101 07/01/2023 0549   CO2 28 06/07/2023 0748   GLUCOSE 120 (H) 07/01/2023 0549   BUN 30 (H) 07/01/2023 0549   BUN 60 (A) 04/30/2020 0000   CREATININE 7.70 (H) 07/01/2023 0549   CREATININE 7.30 (H) 08/27/2020 0000   CALCIUM 10.1 06/07/2023 0748   GFRNONAA 7 (L) 06/07/2023 0748   GFRAA 12 04/30/2020 0000    COAGS: Lab Results  Component Value Date   INR 1.1 10/23/2020   INR 1.1 10/08/2020     Non-Invasive Vascular  Imaging:      ASSESSMENT/PLAN: This is a 83 y.o. male with end-stage renal disease as well as hypertension hyperlipidemia  and diabetes that presents for left arm fistulogram.  Previously had a left arm brachial artery to basilic vein AV graft creation with PTFE on 12/17/2020 by Dr. Randie Heinz.  Patient has had prolonged bleeding from the graft.  Discussed indication for fistulogram and possible intervention if we find any stenosis.  Cephus Shelling, MD [facility] Office: [phone]  Cephus Shelling

## 2023-07-02 ENCOUNTER — Ambulatory Visit: Payer: Self-pay | Admitting: *Deleted

## 2023-07-02 ENCOUNTER — Encounter (HOSPITAL_COMMUNITY): Payer: Self-pay | Admitting: Vascular Surgery

## 2023-07-02 ENCOUNTER — Telehealth: Payer: Self-pay | Admitting: Internal Medicine

## 2023-07-02 DIAGNOSIS — Z992 Dependence on renal dialysis: Secondary | ICD-10-CM | POA: Diagnosis not present

## 2023-07-02 DIAGNOSIS — N186 End stage renal disease: Secondary | ICD-10-CM | POA: Diagnosis not present

## 2023-07-02 MED ORDER — CLOPIDOGREL BISULFATE 75 MG PO TABS: ORAL_TABLET | ORAL | 0 refills | Status: DC

## 2023-07-02 NOTE — Addendum Note (Signed)
 Addended by: Roseanne Reno on: 07/02/2023 02:31 PM   Modules accepted: Orders

## 2023-07-02 NOTE — Patient Outreach (Signed)
 Care Coordination   Follow Up Visit Note   07/02/2023 Name: Trevor [name] MRN: [id number] DOB: 1940/05/16  Trevor [name] is a 83 y.o. year old male who sees Trevor [name], Trevor Hazel, MD for primary care. I spoke with  Trevor [name] by phone today.  What matters to the patients health and wellness today?  Patient called to updated RN CM that he went to his pharmacy and Plavix was not available for pick up. He has been without anticoagulant for 10 days.    Goals Addressed             This Visit's Progress    better control of my health, (CHF, ESRD, DM) (RN CM)       Interventions Today    Flowsheet Row Most Recent Value  Chronic Disease   Chronic disease during today's visit Diabetes, Congestive Heart Failure (CHF), Chronic Kidney Disease/End Stage Renal Disease (ESRD), Other  [increase cost of brillinta, plavix recommended by insurance]  General Interventions   General Interventions Discussed/Reviewed General Interventions Reviewed, [facility], Doctor Visits, Communication with  Doctor Visits Discussed/Reviewed Doctor Visits Reviewed, Specialist  PCP/Specialist Visits Compliance with follow-up visit  Communication with PCP/Specialists, RN  Education Interventions   Education Provided Provided Education  Provided Verbal Education On Medication, Development worker, community  Mental Health Interventions   Mental Health Discussed/Reviewed Mental Health Reviewed, Coping Strategies  Pharmacy Interventions   Pharmacy Dicussed/Reviewed Pharmacy Topics Reviewed, Medications and their functions, Affording Medications, Medication Adherence  Medication Adherence Unable to refill medication  [need refill of anticoagulant Brillinta or Plavix. Plavix at a better cost]              SDOH assessments and interventions completed:  No     Care Coordination Interventions:  Yes, provided   Follow up plan: Follow up call scheduled for 07/06/23    Encounter Outcome:  Patient Visit Completed    Cala Bradford L. Noelle Penner, RN, BSN, CCM [place]  [facility], [facility] Health RN Care Manager Direct Dial: [phone]  Fax: [phone]

## 2023-07-02 NOTE — Telephone Encounter (Signed)
 Medication clarification given to [facility]. Please see previous note regarding loading dose per provider.

## 2023-07-02 NOTE — Telephone Encounter (Signed)
 Pt c/o medication issue:  1. Name of Medication: clopidogrel (PLAVIX) 75 MG tablet   2. How are you currently taking this medication (dosage and times per day)?    3. Are you having a reaction (difficulty breathing--STAT)? no  4. What is your medication issue? Calling to get clarification on the quantity of medication. Please advise

## 2023-07-02 NOTE — Telephone Encounter (Signed)
 Plavix 75 mg sent to pharmacy.   Per Dr. Jenene Slicker- Loading dose: 300 mg on first day and 75 mg first night, THEN 75 mg once daily   Care Manager notified and verbalized understanding.

## 2023-07-05 ENCOUNTER — Ambulatory Visit: Payer: Self-pay | Admitting: *Deleted

## 2023-07-05 DIAGNOSIS — N186 End stage renal disease: Secondary | ICD-10-CM | POA: Diagnosis not present

## 2023-07-05 DIAGNOSIS — Z992 Dependence on renal dialysis: Secondary | ICD-10-CM | POA: Diagnosis not present

## 2023-07-05 NOTE — Telephone Encounter (Signed)
 Pharmacy would like a c/b to clarify below medication instructions. Please advise   clopidogrel (PLAVIX) 75 MG tablet

## 2023-07-05 NOTE — Patient Instructions (Signed)
 Visit Information  Thank you for taking time to visit with me today. Please don't hesitate to contact me if I can be of assistance to you.   Following are the goals we discussed today:   Goals Addressed             This Visit's Progress    Obtain cost efficient anticoagulant "better control of my health", (CHF, ESRD, DM) (RN CM)   Not on track    Interventions Today    Flowsheet Row Most Recent Value  Chronic Disease   Chronic disease during today's visit Other, Congestive Heart Failure (CHF), Hypertension (HTN)  [pt left a voice message for RN CM today at 0847 to update he was not able to get [facility]. Plavix dose needs clarification. He reports [facility] stated dose "too strong". Will also need Rx to mail order]  General Interventions   General Interventions Discussed/Reviewed General Interventions Reviewed, [facility], Communication with  Communication with RN, PCP/Specialists  [in basket message to cardiology to clarify Plavix dose with pt's [facility].Rx routed to his mail order provider until if & when pt completes patient assistance application for Brilinta.]  Pharmacy Interventions   Pharmacy Dicussed/Reviewed Pharmacy Topics Reviewed, Affording Medications, Referral to Pharmacist, Medication Adherence  Medication Adherence Unable to refill medication  [anticoagulant needed]  Referral to Pharmacist Cannot afford medications  [referred to his pcp pharmacist for assistance with patient assistance forms for Brilinta]              Our next appointment is by telephone on 07/06/23 at 3:30 pm  Please call the care guide team at [phone] if you need to cancel or reschedule your appointment.   If you are experiencing a Mental Health or Behavioral Health Crisis or need someone to talk to, please call the Suicide and Crisis Lifeline: 988 call the [place] National Suicide Prevention Lifeline: 769-707-8978 or TTY: (240) 340-1016 TTY (548) 066-4359) to talk to a  trained counselor call 1-800-273-TALK (toll free, 24 hour hotline) call the [facility]: 208-437-6655 call 911   Patient verbalizes understanding of instructions and care plan provided today and agrees to view in MyChart. Active MyChart status and patient understanding of how to access instructions and care plan via MyChart confirmed with patient.     The patient has been provided with contact information for the care management team and has been advised to call with any health related questions or concerns.    Lashea Goda L. Noelle Penner, RN, BSN, CCM [place]  [facility], [facility] Health RN Care Manager Direct Dial: [phone]  Fax: [phone]

## 2023-07-05 NOTE — Patient Instructions (Signed)
 Visit Information  Thank you for taking time to visit with me today. Please don't hesitate to contact me if I can be of assistance to you.   Following are the goals we discussed today:   Goals Addressed             This Visit's Progress    Obtain cost efficient anticoagulant "better control of my health", (CHF, ESRD, DM) (RN CM)       Interventions Today    Flowsheet Row Most Recent Value  Chronic Disease   Chronic disease during today's visit Other, Congestive Heart Failure (CHF), Hypertension (HTN)  [pt left a voice message for RN CM today at 0847 to update he was not able to get [facility]. Plavix dose needs clarification. He reports [facility] stated dose "too strong". Will also need Rx to mail order]  General Interventions   General Interventions Discussed/Reviewed General Interventions Reviewed, [facility], Communication with  Communication with RN, PCP/Specialists  [in basket message to cardiology to clarify Plavix dose with pt's [facility].Rx routed to his mail order provider until if & when pt completes patient assistance application for Brilinta.]  Pharmacy Interventions   Pharmacy Dicussed/Reviewed Pharmacy Topics Reviewed, Affording Medications, Referral to Pharmacist, Medication Adherence  Medication Adherence Unable to refill medication  [anticoagulant needed]  Referral to Pharmacist Cannot afford medications  [referred to his pcp pharmacist for assistance with patient assistance forms for Brilinta]              Our next appointment is by telephone on 07/06/23 at 3:30 pm  Please call the care guide team at [phone] if you need to cancel or reschedule your appointment.   If you are experiencing a Mental Health or Behavioral Health Crisis or need someone to talk to, please call the Suicide and Crisis Lifeline: 988 call the [place] National Suicide Prevention Lifeline: 805-754-8281 or TTY: 236-559-8690 TTY 2811472027) to talk to a trained  counselor call 1-800-273-TALK (toll free, 24 hour hotline) call the [facility]: (614)796-8378 call 911   Patient verbalizes understanding of instructions and care plan provided today and agrees to view in MyChart. Active MyChart status and patient understanding of how to access instructions and care plan via MyChart confirmed with patient.     The patient has been provided with contact information for the care management team and has been advised to call with any health related questions or concerns.    Kyser Wandel L. Noelle Penner, RN, BSN, CCM [place]  [facility], [facility] Health RN Care Manager Direct Dial: [phone]  Fax: [phone]

## 2023-07-05 NOTE — Patient Instructions (Signed)
 Visit Information  Thank you for taking time to visit with me today. Please don't hesitate to contact me if I can be of assistance to you.   Following are the goals we discussed today:   Goals Addressed             This Visit's Progress    Obtain cost efficient anticoagulant "better control of my health", (CHF, ESRD, DM) (RN CM)       Interventions Today    Flowsheet Row Most Recent Value  Chronic Disease   Chronic disease during today's visit Other  [medication Brillinta]  General Interventions   General Interventions Discussed/Reviewed General Interventions Reviewed, Communication with  Doctor Visits Discussed/Reviewed Doctor Visits Reviewed, PCP, Specialist  PCP/Specialist Visits Compliance with follow-up visit  Communication with PCP/Specialists, RN  Nicholes Stairs to cardiology office to collaborate with staff about Brillinta]  Education Interventions   Education Provided Provided Education  [Brilinita, annual insurance formulary & deductibles, patient medication assistance programs & application, what is needed to complete applications, options for phone improvements. pre op care for av fistula]  Provided Verbal Education On Medication, [facility], [employer], Other  Dawn with him to Dr Sherald Hess to leave a message for pre-op cal]  Pharmacy Interventions   Pharmacy Dicussed/Reviewed Pharmacy Topics Reviewed, Affording Medications  Medication Adherence Unable to refill medication  Referral to Pharmacist Cannot afford medications              Our next appointment is by telephone on 07/06/23 at 3:30 pm  Please call the care guide team at [phone] if you need to cancel or reschedule your appointment.   If you are experiencing a Mental Health or Behavioral Health Crisis or need someone to talk to, please call the Suicide and Crisis Lifeline: 988 call the [place] National Suicide Prevention Lifeline: 816-619-8496 or TTY: 8675942934 TTY  6291414984) to talk to a trained counselor call 1-800-273-TALK (toll free, 24 hour hotline) call the [facility]: [phone] call 911   Patient verbalizes understanding of instructions and care plan provided today and agrees to view in MyChart. Active MyChart status and patient understanding of how to access instructions and care plan via MyChart confirmed with patient.     The patient has been provided with contact information for the care management team and has been advised to call with any health related questions or concerns.    Nakul Avino L. Noelle Penner, RN, BSN, CCM [place]  [facility], [facility] Health RN Care Manager Direct Dial: [phone]  Fax: [phone]

## 2023-07-05 NOTE — Patient Outreach (Signed)
 Care Coordination   Follow up on patient voice message/collaboration with Cardiology  Visit Note   07/05/2023 Name: Trevor [name] MRN: [id number] DOB: Jul 05, 1940  Trevor [name] is a 83 y.o. year old male who sees [name], [name], MD for primary care. I  received a voice message from patient, reviewed notes since 07/02/23, messaged cardiology staff with patient needs  What matters to the patients health and wellness today?  Clarification of Plavix dose for [facility], assist from cardiology CMA,  Prescription needs to be routed to mail order vendor   Goals Addressed             This Visit's Progress    Obtain cost efficient anticoagulant "better control of my health", (CHF, ESRD, DM) (RN CM)   Not on track    Interventions Today    Flowsheet Row Most Recent Value  Chronic Disease   Chronic disease during today's visit Other, Congestive Heart Failure (CHF), Hypertension (HTN)  [pt left a voice message for RN CM today at 0847 to update he was not able to get [facility]agulant from [facility]. Plavix dose needs clarification. He reports [facility] stated dose "too strong". Will also need Rx to mail order]  General Interventions   General Interventions Discussed/Reviewed General Interventions Reviewed, [facility], Communication with  Communication with RN, PCP/Specialists  [in basket message to cardiology to clarify Plavix dose with pt's [facility].Rx routed to his mail order provider until if & when pt completes patient assistance application for Brilinta.]  Pharmacy Interventions   Pharmacy Dicussed/Reviewed Pharmacy Topics Reviewed, Affording Medications, Referral to Pharmacist, Medication Adherence  Medication Adherence Unable to refill medication  [anticoagulant needed]  Referral to Pharmacist Cannot afford medications  [referred to his pcp pharmacist for assistance with patient assistance forms for Brilinta]              SDOH assessments and interventions completed:  No     Care  Coordination Interventions:  Yes, provided   Follow up plan: Follow up call scheduled for 07/06/23    Encounter Outcome:  Patient Visit Completed   Cala Bradford L. Noelle Penner, RN, BSN, CCM [place]  [facility], [facility] Health RN Care Manager Direct Dial: [phone]  Fax: [phone]

## 2023-07-05 NOTE — Telephone Encounter (Signed)
 Talked to pharmacy and clarified medication instructions. No further questions or concerns at this time.

## 2023-07-06 ENCOUNTER — Other Ambulatory Visit: Payer: Self-pay | Admitting: *Deleted

## 2023-07-06 ENCOUNTER — Ambulatory Visit: Payer: Self-pay | Admitting: *Deleted

## 2023-07-06 MED ORDER — CLOPIDOGREL BISULFATE 75 MG PO TABS: 75.0000 mg | ORAL_TABLET | Freq: Every day | ORAL | 1 refills | Status: DC

## 2023-07-06 NOTE — Patient Instructions (Signed)
 Visit Information  Thank you for taking time to visit with me today. Please don't hesitate to contact me if I can be of assistance to you.   Following are the goals we discussed today:   Goals Addressed             This Visit's Progress    "better control of my health", (CHF, ESRD, DM) (RN CM)   On track    Interventions Today    Flowsheet Row Most Recent Value  Chronic Disease   Chronic disease during today's visit Other  [confirmed he obtained plavix]  General Interventions   General Interventions Discussed/Reviewed General Interventions Reviewed  Pharmacy Interventions   Pharmacy Dicussed/Reviewed Pharmacy Topics Reviewed, Medications and their functions, Affording Medications           Decrease eating fried foods, continue to do whatever it takes this year to remain healthy.   On track    Interventions Today    Flowsheet Row Most Recent Value  Chronic Disease   Chronic disease during today's visit Other  [confirmed he obtained plavix]  General Interventions   General Interventions Discussed/Reviewed General Interventions Reviewed  Pharmacy Interventions   Pharmacy Dicussed/Reviewed Pharmacy Topics Reviewed, Medications and their functions, Affording Medications              Our next appointment is by telephone on 08/06/23 at 3 pm  Please call the care guide team at [phone] if you need to cancel or reschedule your appointment.   If you are experiencing a Mental Health or Behavioral Health Crisis or need someone to talk to, please call the Suicide and Crisis Lifeline: 988 call the [place] National Suicide Prevention Lifeline: (309) 661-1152 or TTY: 661-213-0089 TTY (630) 247-4277) to talk to a trained counselor call 1-800-273-TALK (toll free, 24 hour hotline) call the [facility]: 332-328-9977 call 911   Patient verbalizes understanding of instructions and care plan provided today and agrees to view in MyChart. Active MyChart status and  patient understanding of how to access instructions and care plan via MyChart confirmed with patient.     The patient has been provided with contact information for the care management team and has been advised to call with any health related questions or concerns.   Moraima Burd L. Noelle Penner, RN, BSN, CCM [place]  [facility], [facility] RN Care Manager Direct Dial: [phone]  Fax: [phone]

## 2023-07-06 NOTE — Patient Outreach (Signed)
 Care Coordination   Follow Up Visit Note   07/06/2023 Name: Trevor [name] MRN: [id number] DOB: [birth year]  DEANGELO BERNS is a 83 y.o. year old male who sees [name], [name], MD for primary care. I spoke with  [name] by phone today.  What matters to the patients health and wellness today?  Plavix received & has taken the starter dose    Goals Addressed             This Visit's Progress    "better control of my health", (CHF, ESRD, DM) (RN CM)   On track    Interventions Today    Flowsheet Row Most Recent Value  Chronic Disease   Chronic disease during today's visit Other  [confirmed he obtained plavix]  General Interventions   General Interventions Discussed/Reviewed General Interventions Reviewed  Pharmacy Interventions   Pharmacy Dicussed/Reviewed Pharmacy Topics Reviewed, Medications and their functions, Affording Medications           Decrease eating fried foods, continue to do whatever it takes this year to remain healthy.   On track    Interventions Today    Flowsheet Row Most Recent Value  Chronic Disease   Chronic disease during today's visit Other  [confirmed he obtained plavix]  General Interventions   General Interventions Discussed/Reviewed General Interventions Reviewed  Pharmacy Interventions   Pharmacy Dicussed/Reviewed Pharmacy Topics Reviewed, Medications and their functions, Affording Medications              SDOH assessments and interventions completed:  No     Care Coordination Interventions:  Yes, provided   Follow up plan: Follow up call scheduled for 08/06/23    Encounter Outcome:  Patient Visit Completed   Cala Bradford L. Noelle Penner, RN, BSN, CCM [place]  [facility], [facility] RN Care Manager Direct Dial: [phone]  Fax: [phone]

## 2023-07-07 DIAGNOSIS — N186 End stage renal disease: Secondary | ICD-10-CM | POA: Diagnosis not present

## 2023-07-07 DIAGNOSIS — Z992 Dependence on renal dialysis: Secondary | ICD-10-CM | POA: Diagnosis not present

## 2023-07-08 DIAGNOSIS — E1122 Type 2 diabetes mellitus with diabetic chronic kidney disease: Secondary | ICD-10-CM | POA: Diagnosis not present

## 2023-07-08 DIAGNOSIS — E785 Hyperlipidemia, unspecified: Secondary | ICD-10-CM | POA: Diagnosis not present

## 2023-07-09 DIAGNOSIS — N186 End stage renal disease: Secondary | ICD-10-CM | POA: Diagnosis not present

## 2023-07-09 DIAGNOSIS — Z992 Dependence on renal dialysis: Secondary | ICD-10-CM | POA: Diagnosis not present

## 2023-07-12 DIAGNOSIS — N186 End stage renal disease: Secondary | ICD-10-CM | POA: Diagnosis not present

## 2023-07-12 DIAGNOSIS — Z992 Dependence on renal dialysis: Secondary | ICD-10-CM | POA: Diagnosis not present

## 2023-07-13 DIAGNOSIS — N186 End stage renal disease: Secondary | ICD-10-CM | POA: Diagnosis not present

## 2023-07-13 DIAGNOSIS — Z992 Dependence on renal dialysis: Secondary | ICD-10-CM | POA: Diagnosis not present

## 2023-07-14 DIAGNOSIS — N186 End stage renal disease: Secondary | ICD-10-CM | POA: Diagnosis not present

## 2023-07-14 DIAGNOSIS — Z992 Dependence on renal dialysis: Secondary | ICD-10-CM | POA: Diagnosis not present

## 2023-07-15 ENCOUNTER — Encounter: Payer: Self-pay | Admitting: Internal Medicine

## 2023-07-15 DIAGNOSIS — N186 End stage renal disease: Secondary | ICD-10-CM | POA: Diagnosis not present

## 2023-07-15 DIAGNOSIS — I5032 Chronic diastolic (congestive) heart failure: Secondary | ICD-10-CM | POA: Diagnosis not present

## 2023-07-15 DIAGNOSIS — I25110 Atherosclerotic heart disease of native coronary artery with unstable angina pectoris: Secondary | ICD-10-CM | POA: Diagnosis not present

## 2023-07-15 DIAGNOSIS — Z992 Dependence on renal dialysis: Secondary | ICD-10-CM | POA: Diagnosis not present

## 2023-07-15 DIAGNOSIS — E785 Hyperlipidemia, unspecified: Secondary | ICD-10-CM | POA: Diagnosis not present

## 2023-07-15 DIAGNOSIS — E1122 Type 2 diabetes mellitus with diabetic chronic kidney disease: Secondary | ICD-10-CM | POA: Diagnosis not present

## 2023-07-15 DIAGNOSIS — I1310 Hypertensive heart and chronic kidney disease without heart failure, with stage 1 through stage 4 chronic kidney disease, or unspecified chronic kidney disease: Secondary | ICD-10-CM | POA: Diagnosis not present

## 2023-07-15 DIAGNOSIS — I129 Hypertensive chronic kidney disease with stage 1 through stage 4 chronic kidney disease, or unspecified chronic kidney disease: Secondary | ICD-10-CM | POA: Diagnosis not present

## 2023-07-15 DIAGNOSIS — I440 Atrioventricular block, first degree: Secondary | ICD-10-CM | POA: Diagnosis not present

## 2023-07-16 DIAGNOSIS — Z992 Dependence on renal dialysis: Secondary | ICD-10-CM | POA: Diagnosis not present

## 2023-07-16 DIAGNOSIS — N186 End stage renal disease: Secondary | ICD-10-CM | POA: Diagnosis not present

## 2023-07-19 DIAGNOSIS — N186 End stage renal disease: Secondary | ICD-10-CM | POA: Diagnosis not present

## 2023-07-19 DIAGNOSIS — Z992 Dependence on renal dialysis: Secondary | ICD-10-CM | POA: Diagnosis not present

## 2023-07-21 DIAGNOSIS — N186 End stage renal disease: Secondary | ICD-10-CM | POA: Diagnosis not present

## 2023-07-21 DIAGNOSIS — Z992 Dependence on renal dialysis: Secondary | ICD-10-CM | POA: Diagnosis not present

## 2023-07-23 DIAGNOSIS — N186 End stage renal disease: Secondary | ICD-10-CM | POA: Diagnosis not present

## 2023-07-23 DIAGNOSIS — Z992 Dependence on renal dialysis: Secondary | ICD-10-CM | POA: Diagnosis not present

## 2023-07-26 DIAGNOSIS — N186 End stage renal disease: Secondary | ICD-10-CM | POA: Diagnosis not present

## 2023-07-26 DIAGNOSIS — Z992 Dependence on renal dialysis: Secondary | ICD-10-CM | POA: Diagnosis not present

## 2023-07-27 ENCOUNTER — Other Ambulatory Visit: Payer: Self-pay

## 2023-07-27 ENCOUNTER — Encounter: Payer: Self-pay | Admitting: *Deleted

## 2023-07-27 ENCOUNTER — Telehealth: Payer: Self-pay | Admitting: *Deleted

## 2023-07-27 NOTE — Patient Outreach (Signed)
 Complex Care Management   Visit Note  07/27/2023  Name:  Trevor [name] MRN: [id number] DOB: 12-27-1940  Situation: Referral received for Complex Care Management related to Chronic Kidney Disease and  ongoing care coordination after October 2024 NSTEMI, Referred from [facility], [facility]al program 02/04/23   I obtained verbal consent from Patient.  Visit completed with Char Common  on the phone  Background:   Past Medical History:  Diagnosis Date   Anemia    Chronic kidney disease    Stage 4?    Diabetes mellitus without complication (HCC)    Hepatitis    not sure what type - over 20 years ago   History of kidney stones    Hyperlipidemia    Hypertension     Assessment: Patient Reported Symptoms:  Cognitive Cognitive Status: Able to follow simple commands, Normal speech and language skills, Insightful and able to interpret abstract concepts, Alert and oriented to person, place, and time Cognitive/Intellectual Conditions Management [RPT]: None reported or documented in medical history or problem list   Health Maintenance Behaviors: Spiritual practice(s), Sleep adequate Healing Pattern: Average Health Facilitated by: Prayer/meditation, Rest  Neurological   Neurological Management Strategies: Activity, Adequate rest, Medication therapy, Routine screening Neurological Self-Management Outcome: 4 (good)  HEENT HEENT Symptoms Reported: Not assessed      Cardiovascular Cardiovascular Symptoms Reported: No symptoms reported Does patient have uncontrolled Hypertension?: No Cardiovascular Conditions: Coronary artery disease, Hypertension, High blood cholesterol, Myocardial infarction, Chest pain Cardiovascular Management Strategies: Activity, Adequate rest, Medication therapy, Routine screening Cardiovascular Self-Management Outcome: 4 (good)  Respiratory Respiratory Symptoms Reported: Not assesed Respiratory Conditions:  (TB lung) Respiratory Self-Management  Outcome: 4 (good)  Endocrine Patient reports the following symptoms related to hypoglycemia or hyperglycemia : Not assessed    Gastrointestinal Gastrointestinal Symptoms Reported: Not assessed      Genitourinary   Genitourinary Management Strategies: Activity, Adequate rest, Fluid modification  Integumentary Integumentary Symptoms Reported: Not assessed    Musculoskeletal Musculoskelatal Symptoms Reviewed: Not assessed        Psychosocial Psychosocial Symptoms Reported: Not assessed     Quality of Family Relationships: helpful, involved, supportive Do you feel physically threatened by others?: No      07/27/2023   10:21 PM  Depression screen PHQ 2/9  Decreased Interest 0  Down, Depressed, Hopeless 1  PHQ - 2 Score 1    There were no vitals filed for this visit.  Medications Reviewed Today     Reviewed by Arlyce Berger, RN (Registered Nurse) on 07/27/23 at 2216  Med List Status: <None>   Medication Order Taking? Sig Documenting Provider Last Dose Status Informant  ACCU-CHEK AVIVA PLUS test strip [id number]  USE TO TEST BLOOD SUGAR BEFORE BREAKFAST AND AT BEDTIME Nida, Gebreselassie W, MD  Active Self  Accu-Chek Softclix Lancets lancets [id number]   [provider]  Active Self  acetaminophen (TYLENOL) 325 MG tablet [id number]  Take 1-2 tablets (325-650 mg total) by mouth every 6 (six) hours as needed for mild pain (pain score 1-3 or temp > 100.5). Ozell Blunt, MD  Active Self  Alcohol Swabs (B-D SINGLE USE SWABS REGULAR) PADS [id number]   [provider]  Active Self  aspirin EC 81 MG tablet [id number]  Take 1 tablet (81 mg total) by mouth daily with breakfast. Swallow whole. Barrett, Andra Kava, PA-C  Active Self  carvedilol (COREG) 12.5 MG tablet [id number] Yes Take 12.5 mg by mouth 2 (two) times daily with a meal. [provider] Taking Active Self  cephALEXin (KEFLEX) 500 MG capsule [id number]  Take 1 capsule (500 mg total) by mouth 2 (two) times  daily.  Patient not taking: Reported on 06/28/2023   Carin Charleston, MD  Active Self  Cholecalciferol (VITAMIN D-3 PO) [id number]  Take 1 tablet by mouth daily. [provider]  Active Self  clopidogrel (PLAVIX) 75 MG tablet [id number]  Take 1 tablet (75 mg total) by mouth daily. Mallipeddi, Vishnu P, MD  Active   CRANBERRY EXTRACT PO [id number]  Take 1 capsule by mouth daily. [provider]  Active Self  hydrALAZINE (APRESOLINE) 50 MG tablet [id number]  Take 50 mg by mouth 2 (two) times daily. [provider]  Active Self  insulin degludec (TRESIBA FLEXTOUCH) 200 UNIT/ML FlexTouch Pen [id number]  Inject 10-24 Units into the skin daily as needed (high blood sugar). [provider]  Active Self  lactulose (CHRONULAC) 10 GM/15ML solution [id number]  Take 20 g by mouth daily as needed for moderate constipation. [provider]  Active Self  nitroGLYCERIN (NITROSTAT) 0.4 MG SL tablet [id number]  Place 1 tablet (0.4 mg total) under the tongue every 5 (five) minutes as needed for chest pain. Barrett, Andra Kava, PA-C  Active Self  rosuvastatin (CRESTOR) 10 MG tablet [id number]  Take 10 mg by mouth every evening. [provider]  Active Self  sodium chloride flush (NS) 0.9 % injection 3 mL [id number]   Young Hensen, MD  Active   Med List Note Finis Hugger, Jennings Mohr, CPhT 10/23/20 1932): Uses [employer] primarily. [facility] for STAT meds.            Recommendation:   PCP Follow-up  Follow Up Plan:   Telephone follow up appointment date/time:  08/06/23 3 pm  Cyara Devoto L. Mcarthur Speedy, RN, BSN, CCM [place]  [facility], [facility] Health RN Care Manager Direct Dial: [phone]  Fax: [phone]

## 2023-07-27 NOTE — Patient Instructions (Signed)
 Visit Information  Thank you for taking time to visit with me today. Please don't hesitate to contact me if I can be of assistance to you before our next scheduled appointment.  Your next care management appointment is by telephone on 08/06/23 at 3 pm  Congratulations on getting your medicine  Please call the care guide team at [phone] if you need to cancel, schedule, or reschedule an appointment.   Please call the Suicide and Crisis Lifeline: 988 call the [place]  National Suicide Prevention Lifeline: 430-761-6236 or TTY: 438-221-6284 TTY 631-565-7563) to talk to a trained counselor call 1-800-273-TALK (toll free, 24 hour hotline) call the [facility]: 319-175-6426 call 911 if you are experiencing a Mental Health or Behavioral Health Crisis or need someone to talk to.  Walterine Amodei L. Mcarthur Speedy, RN, BSN, CCM [place]  [facility], [facility] Health RN Care Manager Direct Dial: [phone]  Fax: [phone]

## 2023-07-28 DIAGNOSIS — N186 End stage renal disease: Secondary | ICD-10-CM | POA: Diagnosis not present

## 2023-07-28 DIAGNOSIS — Z992 Dependence on renal dialysis: Secondary | ICD-10-CM | POA: Diagnosis not present

## 2023-07-30 DIAGNOSIS — Z992 Dependence on renal dialysis: Secondary | ICD-10-CM | POA: Diagnosis not present

## 2023-07-30 DIAGNOSIS — N186 End stage renal disease: Secondary | ICD-10-CM | POA: Diagnosis not present

## 2023-08-02 DIAGNOSIS — N186 End stage renal disease: Secondary | ICD-10-CM | POA: Diagnosis not present

## 2023-08-02 DIAGNOSIS — Z992 Dependence on renal dialysis: Secondary | ICD-10-CM | POA: Diagnosis not present

## 2023-08-04 DIAGNOSIS — Z992 Dependence on renal dialysis: Secondary | ICD-10-CM | POA: Diagnosis not present

## 2023-08-04 DIAGNOSIS — N186 End stage renal disease: Secondary | ICD-10-CM | POA: Diagnosis not present

## 2023-08-06 ENCOUNTER — Ambulatory Visit: Payer: Self-pay | Admitting: *Deleted

## 2023-08-06 ENCOUNTER — Encounter: Payer: Self-pay | Admitting: *Deleted

## 2023-08-06 DIAGNOSIS — Z992 Dependence on renal dialysis: Secondary | ICD-10-CM | POA: Diagnosis not present

## 2023-08-06 DIAGNOSIS — N186 End stage renal disease: Secondary | ICD-10-CM | POA: Diagnosis not present

## 2023-08-06 NOTE — Patient Outreach (Signed)
 Duplicate encounter opened in error  [place] L. Mcarthur Speedy, RN, BSN, CCM [place]  [facility], [facility] Health RN Care Manager Direct Dial : [phone]  Fax: [phone]

## 2023-08-09 DIAGNOSIS — N186 End stage renal disease: Secondary | ICD-10-CM | POA: Diagnosis not present

## 2023-08-09 DIAGNOSIS — Z992 Dependence on renal dialysis: Secondary | ICD-10-CM | POA: Diagnosis not present

## 2023-08-11 DIAGNOSIS — Z992 Dependence on renal dialysis: Secondary | ICD-10-CM | POA: Diagnosis not present

## 2023-08-11 DIAGNOSIS — N186 End stage renal disease: Secondary | ICD-10-CM | POA: Diagnosis not present

## 2023-08-12 DIAGNOSIS — Z992 Dependence on renal dialysis: Secondary | ICD-10-CM | POA: Diagnosis not present

## 2023-08-12 DIAGNOSIS — N186 End stage renal disease: Secondary | ICD-10-CM | POA: Diagnosis not present

## 2023-08-13 DIAGNOSIS — Z992 Dependence on renal dialysis: Secondary | ICD-10-CM | POA: Diagnosis not present

## 2023-08-13 DIAGNOSIS — N186 End stage renal disease: Secondary | ICD-10-CM | POA: Diagnosis not present

## 2023-08-16 DIAGNOSIS — N186 End stage renal disease: Secondary | ICD-10-CM | POA: Diagnosis not present

## 2023-08-16 DIAGNOSIS — Z992 Dependence on renal dialysis: Secondary | ICD-10-CM | POA: Diagnosis not present

## 2023-08-18 DIAGNOSIS — N186 End stage renal disease: Secondary | ICD-10-CM | POA: Diagnosis not present

## 2023-08-18 DIAGNOSIS — Z992 Dependence on renal dialysis: Secondary | ICD-10-CM | POA: Diagnosis not present

## 2023-08-19 DIAGNOSIS — H01002 Unspecified blepharitis right lower eyelid: Secondary | ICD-10-CM | POA: Diagnosis not present

## 2023-08-19 DIAGNOSIS — H01004 Unspecified blepharitis left upper eyelid: Secondary | ICD-10-CM | POA: Diagnosis not present

## 2023-08-19 DIAGNOSIS — H11153 Pinguecula, bilateral: Secondary | ICD-10-CM | POA: Diagnosis not present

## 2023-08-19 DIAGNOSIS — H01001 Unspecified blepharitis right upper eyelid: Secondary | ICD-10-CM | POA: Diagnosis not present

## 2023-08-19 DIAGNOSIS — H40013 Open angle with borderline findings, low risk, bilateral: Secondary | ICD-10-CM | POA: Diagnosis not present

## 2023-08-19 DIAGNOSIS — H2513 Age-related nuclear cataract, bilateral: Secondary | ICD-10-CM | POA: Diagnosis not present

## 2023-08-19 DIAGNOSIS — H01005 Unspecified blepharitis left lower eyelid: Secondary | ICD-10-CM | POA: Diagnosis not present

## 2023-08-20 DIAGNOSIS — N186 End stage renal disease: Secondary | ICD-10-CM | POA: Diagnosis not present

## 2023-08-20 DIAGNOSIS — Z992 Dependence on renal dialysis: Secondary | ICD-10-CM | POA: Diagnosis not present

## 2023-08-23 DIAGNOSIS — N186 End stage renal disease: Secondary | ICD-10-CM | POA: Diagnosis not present

## 2023-08-23 DIAGNOSIS — Z992 Dependence on renal dialysis: Secondary | ICD-10-CM | POA: Diagnosis not present

## 2023-08-25 DIAGNOSIS — Z992 Dependence on renal dialysis: Secondary | ICD-10-CM | POA: Diagnosis not present

## 2023-08-25 DIAGNOSIS — N186 End stage renal disease: Secondary | ICD-10-CM | POA: Diagnosis not present

## 2023-08-27 DIAGNOSIS — Z992 Dependence on renal dialysis: Secondary | ICD-10-CM | POA: Diagnosis not present

## 2023-08-27 DIAGNOSIS — N186 End stage renal disease: Secondary | ICD-10-CM | POA: Diagnosis not present

## 2023-08-30 DIAGNOSIS — N186 End stage renal disease: Secondary | ICD-10-CM | POA: Diagnosis not present

## 2023-08-30 DIAGNOSIS — E1122 Type 2 diabetes mellitus with diabetic chronic kidney disease: Secondary | ICD-10-CM | POA: Diagnosis not present

## 2023-08-30 DIAGNOSIS — Z794 Long term (current) use of insulin: Secondary | ICD-10-CM | POA: Diagnosis not present

## 2023-08-30 DIAGNOSIS — Z992 Dependence on renal dialysis: Secondary | ICD-10-CM | POA: Diagnosis not present

## 2023-09-01 DIAGNOSIS — Z992 Dependence on renal dialysis: Secondary | ICD-10-CM | POA: Diagnosis not present

## 2023-09-01 DIAGNOSIS — N186 End stage renal disease: Secondary | ICD-10-CM | POA: Diagnosis not present

## 2023-09-03 DIAGNOSIS — N186 End stage renal disease: Secondary | ICD-10-CM | POA: Diagnosis not present

## 2023-09-03 DIAGNOSIS — Z992 Dependence on renal dialysis: Secondary | ICD-10-CM | POA: Diagnosis not present

## 2023-09-06 DIAGNOSIS — N186 End stage renal disease: Secondary | ICD-10-CM | POA: Diagnosis not present

## 2023-09-06 DIAGNOSIS — Z992 Dependence on renal dialysis: Secondary | ICD-10-CM | POA: Diagnosis not present

## 2023-09-08 DIAGNOSIS — N186 End stage renal disease: Secondary | ICD-10-CM | POA: Diagnosis not present

## 2023-09-08 DIAGNOSIS — Z992 Dependence on renal dialysis: Secondary | ICD-10-CM | POA: Diagnosis not present

## 2023-09-09 ENCOUNTER — Other Ambulatory Visit: Payer: Self-pay | Admitting: *Deleted

## 2023-09-09 ENCOUNTER — Other Ambulatory Visit: Payer: Self-pay

## 2023-09-09 NOTE — Patient Instructions (Signed)
 Visit Information  Thank you for taking time to visit with me today. Please don't hesitate to contact me if I can be of assistance to you before our next scheduled appointment.  Your next care management appointment is by telephone on 10/12/23 at 1100  Remember to call RN CM as needed  Please call the care guide team at [phone] if you need to cancel, schedule, or reschedule an appointment.   Please call the Suicide and Crisis Lifeline: 988 call the [place]  National Suicide Prevention Lifeline: 585-066-0042 or TTY: (941)471-6107 TTY 2120869786) to talk to a trained counselor call 1-800-273-TALK (toll free, 24 hour hotline) call the [facility]: 608-257-1863 call 911 if you are experiencing a Mental Health or Behavioral Health Crisis or need someone to talk to.   Ellijah Leffel L. Ramonita, RN, BSN, CCM [place]  [facility], [facility] Health RN Care Manager Direct Dial : [phone]  Fax: [phone]

## 2023-09-10 DIAGNOSIS — Z992 Dependence on renal dialysis: Secondary | ICD-10-CM | POA: Diagnosis not present

## 2023-09-10 DIAGNOSIS — N186 End stage renal disease: Secondary | ICD-10-CM | POA: Diagnosis not present

## 2023-09-12 DIAGNOSIS — Z992 Dependence on renal dialysis: Secondary | ICD-10-CM | POA: Diagnosis not present

## 2023-09-12 DIAGNOSIS — N186 End stage renal disease: Secondary | ICD-10-CM | POA: Diagnosis not present

## 2023-09-13 DIAGNOSIS — Z992 Dependence on renal dialysis: Secondary | ICD-10-CM | POA: Diagnosis not present

## 2023-09-13 DIAGNOSIS — N186 End stage renal disease: Secondary | ICD-10-CM | POA: Diagnosis not present

## 2023-09-15 DIAGNOSIS — N186 End stage renal disease: Secondary | ICD-10-CM | POA: Diagnosis not present

## 2023-09-15 DIAGNOSIS — Z992 Dependence on renal dialysis: Secondary | ICD-10-CM | POA: Diagnosis not present

## 2023-09-17 DIAGNOSIS — Z992 Dependence on renal dialysis: Secondary | ICD-10-CM | POA: Diagnosis not present

## 2023-09-17 DIAGNOSIS — N186 End stage renal disease: Secondary | ICD-10-CM | POA: Diagnosis not present

## 2023-09-20 DIAGNOSIS — N186 End stage renal disease: Secondary | ICD-10-CM | POA: Diagnosis not present

## 2023-09-20 DIAGNOSIS — Z992 Dependence on renal dialysis: Secondary | ICD-10-CM | POA: Diagnosis not present

## 2023-09-22 DIAGNOSIS — N186 End stage renal disease: Secondary | ICD-10-CM | POA: Diagnosis not present

## 2023-09-22 DIAGNOSIS — Z992 Dependence on renal dialysis: Secondary | ICD-10-CM | POA: Diagnosis not present

## 2023-09-24 DIAGNOSIS — N186 End stage renal disease: Secondary | ICD-10-CM | POA: Diagnosis not present

## 2023-09-24 DIAGNOSIS — Z992 Dependence on renal dialysis: Secondary | ICD-10-CM | POA: Diagnosis not present

## 2023-09-27 DIAGNOSIS — Z992 Dependence on renal dialysis: Secondary | ICD-10-CM | POA: Diagnosis not present

## 2023-09-27 DIAGNOSIS — N186 End stage renal disease: Secondary | ICD-10-CM | POA: Diagnosis not present

## 2023-09-27 DIAGNOSIS — H2512 Age-related nuclear cataract, left eye: Secondary | ICD-10-CM | POA: Diagnosis not present

## 2023-09-29 DIAGNOSIS — Z992 Dependence on renal dialysis: Secondary | ICD-10-CM | POA: Diagnosis not present

## 2023-09-29 DIAGNOSIS — N186 End stage renal disease: Secondary | ICD-10-CM | POA: Diagnosis not present

## 2023-09-30 ENCOUNTER — Other Ambulatory Visit: Payer: Self-pay

## 2023-09-30 ENCOUNTER — Encounter (HOSPITAL_COMMUNITY)
Admission: RE | Admit: 2023-09-30 | Discharge: 2023-09-30 | Disposition: A | Discharge status: Home / Self Care | Source: Ambulatory Visit | Attending: Ophthalmology | Admitting: Ophthalmology

## 2023-09-30 ENCOUNTER — Encounter (HOSPITAL_COMMUNITY): Payer: Self-pay

## 2023-09-30 NOTE — Pre-Procedure Instructions (Signed)
 Attempted pre-op phone call. Left VM for him to call us back.

## 2023-09-30 NOTE — H&P (Signed)
 Surgical History & Physical  Patient Name: Trevor [name]  DOB: 1941-02-19  Surgery: Cataract extraction with intraocular lens implant phacoemulsification; Left Eye Surgeon: Tarri Farm MD Surgery Date: 10/04/2023 Pre-Op  Date: 08/19/2023  HPI: A 62 Yr. old male patient 1. The patient is here for cataract evaluation. Ref by Dr. Nolon Baxter. Pt. complains of difficulty when reading and distance. Both eyes are affected. The episode is gradual. Symptoms occur when the patient is driving and reading. Distance vision is worse than near. The complaint is associated with blurry vision. This is negatively affecting the patient's quality of life and the patient is unable to function adequately in life with the current level of vision. HPI Completed by Dr. Tarri Farm  Medical History:  Dialysis Heart Problem  Review of Systems Negative Allergic/Immunologic Negative Cardiovascular Negative Constitutional Negative Ear, Nose, Mouth & Throat Negative Endocrine Negative Eyes Negative Gastrointestinal Negative Genitourinary Negative Hemotologic/Lymphatic Negative Integumentary Negative Musculoskeletal Negative Neurological Negative Psychiatry Negative Respiratory  Social Never smoked  Medication Amlodipine , Hydralazine , Carvedilol , Nitroglycerin , Ondansetron  HCl, Erythromycin, Albuterol sulfate, Isosorbide  mononitrate, Rosuvastatin , Prednisolon-moxiflox-bromf(PF)  Sx/Procedures Hip Replacement- both, Stent in heart/heart attack  Drug Allergies  NKDA  History & Physical: Heent: cataracts NECK: supple without bruits LUNGS: lungs clear to auscultation CV: regular rate and rhythm Abdomen: soft and non-tender  Impression & Plan: Assessment: 1.  NUCLEAR SCLEROSIS AGE RELATED; Both Eyes (H25.13) 2.  OAG BORDERLINE FINDINGS LOW RISK; Both Eyes (H40.013) 3.  BLEPHARITIS; Right Upper Lid, Right Lower Lid, Left Upper Lid, Left Lower Lid (H01.001, H01.002,H01.004,H01.005) 4.  Pinguecula; Both  Eyes (H11.153)  Plan: 1.  Cataract accounts for the patient's decreased vision. This visual impairment is not correctable with a tolerable change in glasses or contact lenses. Cataract surgery with an implantation of a new lens should significantly improve the visual and functional status of the patient. Discussed all risks, benefits, alternatives, and potential complications. Discussed the procedures and recovery. Patient desires to have surgery. A-scan ordered and performed today for intra-ocular lens calculations. The surgery will be performed in order to improve vision for driving, reading, and for eye examinations. Recommend phacoemulsification with intra-ocular lens. Recommend Dextenza  for post-operative pain and inflammation. Left Eye non-dominant - first. Surgery required to correct imbalance of vision. Dilates well - shugarcaine by protocol. Declines Multifocal IOL.  2.  Based on cup-to-disc ratio. Negative Family history. OCT rNFL shows: WNL OU. Detailed discussion about glaucoma today including importance of maintaining good follow up and following treatment plan, and the possibility of irreversible blindness as part of this disease process.  3.  Blepharitis is present - recommend regular lid cleaning.  4.  Observe; Artificial tears as needed for irritation.

## 2023-10-01 DIAGNOSIS — N186 End stage renal disease: Secondary | ICD-10-CM | POA: Diagnosis not present

## 2023-10-01 DIAGNOSIS — Z992 Dependence on renal dialysis: Secondary | ICD-10-CM | POA: Diagnosis not present

## 2023-10-04 ENCOUNTER — Encounter (HOSPITAL_COMMUNITY): Payer: Self-pay | Admitting: Ophthalmology

## 2023-10-04 ENCOUNTER — Encounter (HOSPITAL_COMMUNITY)
Admission: RE | Disposition: A | Discharge status: Home / Self Care | Payer: Self-pay | Source: Home / Self Care | Attending: Ophthalmology

## 2023-10-04 ENCOUNTER — Other Ambulatory Visit: Payer: Self-pay

## 2023-10-04 ENCOUNTER — Ambulatory Visit (HOSPITAL_COMMUNITY)
Admission: RE | Admit: 2023-10-04 | Discharge: 2023-10-04 | Disposition: A | Discharge status: Home / Self Care | Attending: Ophthalmology | Admitting: Ophthalmology

## 2023-10-04 ENCOUNTER — Ambulatory Visit (HOSPITAL_BASED_OUTPATIENT_CLINIC_OR_DEPARTMENT_OTHER): Payer: Self-pay | Admitting: Anesthesiology

## 2023-10-04 ENCOUNTER — Ambulatory Visit (HOSPITAL_COMMUNITY): Payer: Self-pay | Admitting: Anesthesiology

## 2023-10-04 DIAGNOSIS — Z992 Dependence on renal dialysis: Secondary | ICD-10-CM | POA: Diagnosis not present

## 2023-10-04 DIAGNOSIS — H2512 Age-related nuclear cataract, left eye: Secondary | ICD-10-CM | POA: Insufficient documentation

## 2023-10-04 DIAGNOSIS — I509 Heart failure, unspecified: Secondary | ICD-10-CM | POA: Insufficient documentation

## 2023-10-04 DIAGNOSIS — Z794 Long term (current) use of insulin: Secondary | ICD-10-CM | POA: Insufficient documentation

## 2023-10-04 DIAGNOSIS — E1136 Type 2 diabetes mellitus with diabetic cataract: Secondary | ICD-10-CM | POA: Diagnosis not present

## 2023-10-04 DIAGNOSIS — H5712 Ocular pain, left eye: Secondary | ICD-10-CM | POA: Diagnosis not present

## 2023-10-04 DIAGNOSIS — E1122 Type 2 diabetes mellitus with diabetic chronic kidney disease: Secondary | ICD-10-CM | POA: Insufficient documentation

## 2023-10-04 DIAGNOSIS — N186 End stage renal disease: Secondary | ICD-10-CM | POA: Diagnosis not present

## 2023-10-04 DIAGNOSIS — I120 Hypertensive chronic kidney disease with stage 5 chronic kidney disease or end stage renal disease: Secondary | ICD-10-CM

## 2023-10-04 DIAGNOSIS — I252 Old myocardial infarction: Secondary | ICD-10-CM | POA: Diagnosis not present

## 2023-10-04 DIAGNOSIS — I132 Hypertensive heart and chronic kidney disease with heart failure and with stage 5 chronic kidney disease, or end stage renal disease: Secondary | ICD-10-CM | POA: Diagnosis not present

## 2023-10-04 DIAGNOSIS — I2510 Atherosclerotic heart disease of native coronary artery without angina pectoris: Secondary | ICD-10-CM | POA: Diagnosis not present

## 2023-10-04 DIAGNOSIS — K219 Gastro-esophageal reflux disease without esophagitis: Secondary | ICD-10-CM | POA: Insufficient documentation

## 2023-10-04 HISTORY — PX: CATARACT EXTRACTION W/PHACO: SHX586

## 2023-10-04 HISTORY — PX: INSERTION, STENT, DRUG-ELUTING, LACRIMAL CANALICULUS: SHX7453

## 2023-10-04 LAB — POCT I-STAT, CHEM 8
BUN: 41 mg/dL — ABNORMAL HIGH (ref 8–23)
Calcium, Ion: 0.96 mmol/L — ABNORMAL LOW (ref 1.15–1.40)
Chloride: 99 mmol/L (ref 98–111)
Creatinine, Ser: 6.8 mg/dL — ABNORMAL HIGH (ref 0.61–1.24)
Glucose, Bld: 100 mg/dL — ABNORMAL HIGH (ref 70–99)
HCT: 40 % (ref 39.0–52.0)
Hemoglobin: 13.6 g/dL (ref 13.0–17.0)
Potassium: 4.9 mmol/L (ref 3.5–5.1)
Sodium: 135 mmol/L (ref 135–145)
TCO2: 26 mmol/L (ref 22–32)

## 2023-10-04 SURGERY — PHACOEMULSIFICATION, CATARACT, WITH IOL INSERTION
Anesthesia: Monitor Anesthesia Care | Site: Eye | Laterality: Left

## 2023-10-04 MED ORDER — LACTATED RINGERS IV SOLN: INTRAVENOUS | Status: DC

## 2023-10-04 MED ORDER — DEXAMETHASONE 0.4 MG OP INST
VAGINAL_INSERT | OPHTHALMIC | Status: DC | PRN
Start: 2023-10-04 — End: 2023-10-04
  Administered 2023-10-04: .4 mg via OPHTHALMIC

## 2023-10-04 MED ORDER — DEXAMETHASONE 0.4 MG OP INST
VAGINAL_INSERT | OPHTHALMIC | Status: AC
Start: 2023-10-04 — End: 2023-10-04
  Filled 2023-10-04: qty 1

## 2023-10-04 MED ORDER — TROPICAMIDE 1 % OP SOLN
1.0000 [drp] | OPHTHALMIC | Status: AC | PRN
  Administered 2023-10-04 (×3): 1 [drp] via OPHTHALMIC

## 2023-10-04 MED ORDER — PHENYLEPHRINE-KETOROLAC 1-0.3 % IO SOLN
INTRAOCULAR | Status: DC | PRN
  Administered 2023-10-04: 500 mL via OPHTHALMIC

## 2023-10-04 MED ORDER — SODIUM HYALURONATE 23MG/ML IO SOSY
PREFILLED_SYRINGE | INTRAOCULAR | Status: DC | PRN
  Administered 2023-10-04: .6 mL via INTRAOCULAR

## 2023-10-04 MED ORDER — BSS IO SOLN
INTRAOCULAR | Status: DC | PRN
Start: 2023-10-04 — End: 2023-10-04
  Administered 2023-10-04: 15 mL via INTRAOCULAR

## 2023-10-04 MED ORDER — SODIUM HYALURONATE 10 MG/ML IO SOLUTION
PREFILLED_SYRINGE | INTRAOCULAR | Status: DC | PRN
  Administered 2023-10-04: .85 mL via INTRAOCULAR

## 2023-10-04 MED ORDER — MOXIFLOXACIN HCL 5 MG/ML IO SOLN
INTRAOCULAR | Status: DC | PRN
Start: 2023-10-04 — End: 2023-10-04
  Administered 2023-10-04: .2 mL via INTRACAMERAL

## 2023-10-04 MED ORDER — LIDOCAINE HCL 3.5 % OP GEL
1.0000 | Freq: Once | OPHTHALMIC | Status: AC
  Administered 2023-10-04: 1 via OPHTHALMIC

## 2023-10-04 MED ORDER — POVIDONE-IODINE 5 % OP SOLN
OPHTHALMIC | Status: DC | PRN
  Administered 2023-10-04: 1 via OPHTHALMIC

## 2023-10-04 MED ORDER — STERILE WATER FOR IRRIGATION IR SOLN
Status: DC | PRN
  Administered 2023-10-04: 250 mL

## 2023-10-04 MED ORDER — TETRACAINE HCL 0.5 % OP SOLN
1.0000 [drp] | OPHTHALMIC | Status: AC | PRN
  Administered 2023-10-04 (×3): 1 [drp] via OPHTHALMIC

## 2023-10-04 MED ORDER — PHENYLEPHRINE HCL 2.5 % OP SOLN
1.0000 [drp] | OPHTHALMIC | Status: AC | PRN
  Administered 2023-10-04 (×3): 1 [drp] via OPHTHALMIC

## 2023-10-04 SURGICAL SUPPLY — 12 items
CATARACT SUITE SIGHTPATH (MISCELLANEOUS) ×2 IMPLANT
CLOTH BEACON ORANGE TIMEOUT ST (SAFETY) ×2 IMPLANT
EYE SHIELD UNIVERSAL CLEAR (GAUZE/BANDAGES/DRESSINGS) IMPLANT
FEE CATARACT SUITE SIGHTPATH (MISCELLANEOUS) ×2 IMPLANT
GLOVE BIOGEL PI IND STRL 7.0 (GLOVE) ×4 IMPLANT
LENS IOL TECNIS EYHANCE 18.0 (Intraocular Lens) IMPLANT
NDL HYPO 18GX1.5 BLUNT FILL (NEEDLE) ×2 IMPLANT
NEEDLE HYPO 18GX1.5 BLUNT FILL (NEEDLE) ×2 IMPLANT
PAD ARMBOARD POSITIONER FOAM (MISCELLANEOUS) ×2 IMPLANT
SYR TB 1ML LL NO SAFETY (SYRINGE) ×2 IMPLANT
TAPE SURG TRANSPORE 1 IN (GAUZE/BANDAGES/DRESSINGS) IMPLANT
WATER STERILE IRR 250ML POUR (IV SOLUTION) ×2 IMPLANT

## 2023-10-04 NOTE — Op Note (Signed)
 Date of procedure: 10/04/23  Pre-operative diagnosis:  Visually significant age-related nuclear cataract, Left Eye (H25.12)  Post-operative diagnosis:   1. Visually significant age-related nuclear cataract, Left Eye (H25.12) 2. Pain and inflammation following cataract surgery Left Eye (H57.12)  Procedure:  Removal of cataract via phacoemulsification and insertion of intra-ocular lens [employer] DIB00 +18.0D into the capsular bag of the Left Eye 2. Placement of Dextenza  insert, Left Eye  Attending surgeon: Lynwood LABOR. Phoenicia Pirie, MD, MA  Anesthesia: MAC, Topical Akten  Complications: None  Estimated Blood Loss: <38mL (minimal)  Specimens: None  Implants: As above  Indications:  Visually significant age-related cataract, Left Eye  Procedure:  The patient was seen and identified in the pre-operative area. The operative eye was identified and dilated.  The operative eye was marked.  Topical anesthesia was administered to the operative eye.     The patient was then to the operative suite and placed in the supine position.  A timeout was performed confirming the patient, procedure to be performed, and all other relevant information.   The patient's face was prepped and draped in the usual fashion for intra-ocular surgery.  A lid speculum was placed into the operative eye and the surgical microscope moved into place and focused.  An inferotemporal paracentesis was created using a 20 gauge paracentesis blade. Omidria was injected into the anterior chamber. Shugarcaine was injected into the anterior chamber.  Viscoelastic was injected into the anterior chamber.  A temporal clear-corneal main wound incision was created using a 2.41mm microkeratome.  A continuous curvilinear capsulorrhexis was initiated using an irrigating cystitome and completed using capsulorrhexis forceps.  Hydrodissection and hydrodeliniation were performed.  Viscoelastic was injected into the anterior chamber.  A  phacoemulsification handpiece and a chopper as a second instrument were used to remove the nucleus and epinucleus. The irrigation/aspiration handpiece was used to remove any remaining cortical material.   The capsular bag was reinflated with viscoelastic, checked, and found to be intact.  The intraocular lens was inserted into the capsular bag.  The irrigation/aspiration handpiece was used to remove any remaining viscoelastic.  The clear corneal wound and paracentesis wounds were then hydrated and checked with Weck-Cels to be watertight. 0.1mL of moxifloxacin was injected into the anterior chamber.  The lid-speculum was removed. The lower punctum was dilated. A Dextenza  implant was placed in the lower canaliculus without complication.  The drape was removed.  The patient's face was cleaned with a wet and dry 4x4. A clear shield was taped over the eye. The patient was taken to the post-operative care unit in good condition, having tolerated the procedure well.  Post-Op Instructions: The patient will follow up at [facility] for a same day post-operative evaluation and will receive all other orders and instructions.

## 2023-10-04 NOTE — Interval H&P Note (Signed)
 History and Physical Interval Note:  10/04/2023 12:51 PM  Trevor [name]  has presented today for surgery, with the diagnosis of nuclear sclerotic cataract, left eye.  The various methods of treatment have been discussed with the patient and family. After consideration of risks, benefits and other options for treatment, the patient has consented to  Procedure(s) with comments: PHACOEMULSIFICATION, CATARACT, WITH IOL INSERTION (Left) - CDE: INSERTION, STENT, DRUG-ELUTING, LACRIMAL CANALICULUS (Left) as a surgical intervention.  The patient's history has been reviewed, patient examined, no change in status, stable for surgery.  I have reviewed the patient's chart and labs.  Questions were answered to the patient's satisfaction.     HARRIE AGENT

## 2023-10-04 NOTE — Discharge Instructions (Addendum)
 Please discharge patient when stable, will follow up today with Dr. June Leap at the [facility] office immediately following discharge.  Leave shield in place until visit.  All paperwork with discharge instructions will be given at the office.  [facility] Address:  [address]  [place], [place] [address]

## 2023-10-04 NOTE — Anesthesia Preprocedure Evaluation (Addendum)
 Anesthesia Evaluation  Patient identified by MRN, date of birth, ID band Patient awake    Reviewed: Allergy & Precautions, NPO status , Patient's Chart, lab work & pertinent test results  Airway Mallampati: I  TM Distance: >3 FB Neck ROM: Full    Dental   Pulmonary shortness of breath   Pulmonary exam normal        Cardiovascular hypertension, + CAD, + Past MI and +CHF  Normal cardiovascular exam+ dysrhythmias      Neuro/Psych negative neurological ROS  negative psych ROS   GI/Hepatic ,GERD  ,,(+) Hepatitis -  Endo/Other  diabetes, Type 2, Insulin  Dependent    Renal/GU ESRF and DialysisRenal disease  negative genitourinary   Musculoskeletal negative musculoskeletal ROS (+)    Abdominal Normal abdominal exam  (+)   Peds  Hematology  (+) Blood dyscrasia, anemia   Anesthesia Other Findings   Reproductive/Obstetrics                             Anesthesia Physical Anesthesia Plan  ASA: 3  Anesthesia Plan: MAC   Post-op Pain Management:    Induction:   PONV Risk Score and Plan:   Airway Management Planned: Natural Airway and Nasal Cannula  Additional Equipment:   Intra-op Plan:   Post-operative Plan:   Informed Consent: I have reviewed the patients History and Physical, chart, labs and discussed the procedure including the risks, benefits and alternatives for the proposed anesthesia with the patient or authorized representative who has indicated his/her understanding and acceptance.     Dental advisory given  Plan Discussed with: Anesthesiologist  Anesthesia Plan Comments:        Anesthesia Quick Evaluation

## 2023-10-05 ENCOUNTER — Encounter (HOSPITAL_COMMUNITY): Payer: Self-pay | Admitting: Ophthalmology

## 2023-10-06 DIAGNOSIS — N186 End stage renal disease: Secondary | ICD-10-CM | POA: Diagnosis not present

## 2023-10-06 DIAGNOSIS — Z992 Dependence on renal dialysis: Secondary | ICD-10-CM | POA: Diagnosis not present

## 2023-10-08 DIAGNOSIS — Z992 Dependence on renal dialysis: Secondary | ICD-10-CM | POA: Diagnosis not present

## 2023-10-08 DIAGNOSIS — N186 End stage renal disease: Secondary | ICD-10-CM | POA: Diagnosis not present

## 2023-10-11 DIAGNOSIS — Z992 Dependence on renal dialysis: Secondary | ICD-10-CM | POA: Diagnosis not present

## 2023-10-11 DIAGNOSIS — N186 End stage renal disease: Secondary | ICD-10-CM | POA: Diagnosis not present

## 2023-10-12 ENCOUNTER — Other Ambulatory Visit: Payer: Self-pay

## 2023-10-12 ENCOUNTER — Inpatient Hospital Stay (HOSPITAL_COMMUNITY)
Admission: RE | Admit: 2023-10-12 | Discharge: 2023-10-12 | Disposition: A | Discharge status: Home / Self Care | Source: Ambulatory Visit

## 2023-10-12 ENCOUNTER — Encounter: Payer: Self-pay | Admitting: *Deleted

## 2023-10-12 ENCOUNTER — Other Ambulatory Visit: Payer: Self-pay | Admitting: *Deleted

## 2023-10-12 DIAGNOSIS — N186 End stage renal disease: Secondary | ICD-10-CM | POA: Diagnosis not present

## 2023-10-12 DIAGNOSIS — Z992 Dependence on renal dialysis: Secondary | ICD-10-CM | POA: Diagnosis not present

## 2023-10-12 NOTE — Patient Instructions (Signed)
 Visit Information  Thank you for taking time to visit with me today. Please don't hesitate to contact me if I can be of assistance to you before our next scheduled appointment.  Your next care management appointment is by telephone on 11/12/23 at 1 pm  Dr Denise will be sending GERD medicine for you to the pharmacy  Please call the care guide team at [phone] if you need to cancel, schedule, or reschedule an appointment.   Please call the Suicide and Crisis Lifeline: 988 call the [place]  National Suicide Prevention Lifeline: 270-273-9380 or TTY: 786-224-5576 TTY 505 328 2436) to talk to a trained counselor call 1-800-273-TALK (toll free, 24 hour hotline) call the [facility]: [phone] call 911 if you are experiencing a Mental Health or Behavioral Health Crisis or need someone to talk to.  Tarris Delbene L. Ramonita, RN, BSN, CCM [place]  [facility], [facility] Health RN Care Manager Direct Dial : [phone]  Fax: [phone]

## 2023-10-12 NOTE — Transfer of Care (Signed)
 Immediate Anesthesia Transfer of Care Note  Patient: Trevor [name]  Procedure(s) Performed: PHACOEMULSIFICATION, CATARACT, WITH IOL INSERTION (Left: Eye) INSERTION, STENT, DRUG-ELUTING, LACRIMAL CANALICULUS (Left)  Patient Location: Short Stay  Anesthesia Type:MAC  Level of Consciousness: awake, alert , and oriented  Airway & Oxygen Therapy: Patient Spontanous Breathing  Post-op Assessment: Report given to RN and Post -op Vital signs reviewed and stable  Post vital signs: Reviewed and stable  Last Vitals:  Vitals Value Taken Time  BP 105/70 10/04/23 13:26  Temp 36.6 C 10/04/23 13:26  Pulse 63 10/04/23 13:26  Resp 18 10/04/23 13:26  SpO2 100 % 10/04/23 13:26    Last Pain:  Vitals:   10/04/23 1326  TempSrc: Oral  PainSc: 0-No pain      Patients Stated Pain Goal: 6 (10/04/23 1326)  Complications: No notable events documented.

## 2023-10-12 NOTE — H&P (Signed)
 Surgical History & Physical  Patient Name: Trevor [name]  DOB: Sep 21, 1940  Surgery: Removal of retained lens fragment, left eye Surgeon: Lynwood Hermann MD Surgery Date: 10/13/2023 Pre-Op  Date: 10/11/2023  HPI: A 26 Yr. old male patient 1. The patient is returning after cataract surgery. The left eye is affected. Status post cataract surgery, which began 1 week ago: Since the last visit, the affected area feels improvement. The patient's vision is stable. The condition's severity is constant. Patient is following medication instructions. 2. 2. The patient is returning for a cataract follow-up of the right eye. Since the last visit, the affected area is tolerating. The patient's vision is blurry. The condition's severity is constant. Patient is not taking medications. This is negatively affecting the patient's quality of life and the patient is unable to function adequately in life with the current level of vision. HPI Completed by Dr. Lynwood Hermann  Medical History:  Dialysis Heart Problem  Review of Systems Negative Allergic/Immunologic Heart stent Cardiovascular Negative Constitutional Negative Ear, Nose, Mouth & Throat On Dialysis Endocrine Negative Eyes Negative Gastrointestinal Negative Genitourinary Negative Hemotologic/Lymphatic Negative Integumentary Negative Musculoskeletal Negative Neurological Negative Psychiatry Negative Respiratory  Social Never smoked   Medication Prednisolon-moxiflox-bromf(PF),  Amlodipine , Hydralazine , Carvedilol , Nitroglycerin , Ondansetron  HCl, Erythromycin, Albuterol sulfate, Isosorbide  mononitrate, Rosuvastatin   Sx/Procedures Phaco c IOL OS-dextenza ,  Hip Replacement- both, Stent in heart/heart attack  Drug Allergies  NKDA  History & Physical: Heent: retained lens fragment NECK: supple without bruits LUNGS: lungs clear to auscultation CV: regular rate and rhythm Abdomen: soft and non-tender  Impression & Plan: Assessment: 1.   LENS FRAGMENTS IN EYE FOLLOWING CATARACT SURGERY; Left Eye (H59.022) 2.  CATARACT EXTRACTION STATUS; Left Eye (Z98.42) 3.  NUCLEAR SCLEROSIS AGE RELATED; , Right Eye (H25.11)  Plan: 1.  Found on exam. No inflammation and normal IOP at this point. Recommend surgical removal. Risks, benefits, alternatives, and complications discussed. Please schedule procedure.  2.  1 week after cataract surgery. As above. Call with any problems or concerns. Continue Pred-Mox-Brom Combo drop 4x/day untill follow up..  3.  Cataract accounts for the patient's decreased vision. This visual impairment is not correctable with a tolerable change in glasses or contact lenses. Cataract surgery with an implantation of a new lens should significantly improve the visual and functional status of the patient.Discussed all risks, benefits, alternatives, and potential complications. Discussed the procedures and recovery. Patient desires to have surgery. A-scan ordered and performed today for intra-ocular lens calculations. The surgery will be performed in order to improve vision for driving, reading, and for eye examinations. Recommend phacoemulsification with intra-ocular lens. Recommend Dextenza  for post-operative pain and inflammation. History of refractive Surgery: None Use of Eye Pressure Lowering Drops: No Right Eye. Surgery required to correct imbalance of vision. Dilates well - shugarcaine or Lidocaine +Omidira by protocol Standard IOL.

## 2023-10-12 NOTE — Anesthesia Postprocedure Evaluation (Signed)
 Anesthesia Post Note  Patient: Trevor [name]  Procedure(s) Performed: PHACOEMULSIFICATION, CATARACT, WITH IOL INSERTION (Left: Eye) INSERTION, STENT, DRUG-ELUTING, LACRIMAL CANALICULUS (Left)  Patient location during evaluation: Short Stay Anesthesia Type: MAC Level of consciousness: awake and alert and oriented Pain management: pain level controlled Vital Signs Assessment: post-procedure vital signs reviewed and stable Respiratory status: spontaneous breathing Cardiovascular status: blood pressure returned to baseline Anesthetic complications: no   No notable events documented.   Last Vitals:  Vitals:   10/04/23 1326  BP: 105/70  Pulse: 63  Resp: 18  Temp: 36.6 C  SpO2: 100%    Last Pain:  Vitals:   10/04/23 1326  TempSrc: Oral  PainSc: 0-No pain                 Delon LITTIE Shams

## 2023-10-12 NOTE — Pre-Procedure Instructions (Signed)
 Attempted pre-op phone call. Left VM for him to call us back.

## 2023-10-12 NOTE — Patient Outreach (Signed)
 Complex Care Management   Visit Note  10/12/2023  Name:  Trevor [name] MRN: [id number] DOB: 1940/12/20  Situation: Referral received for Complex Care Management related to Acute MI and post hospital acute needs management I obtained verbal consent from Patient.  Visit completed with Mr Trevor [name]  on the phone  Mr Sangha reports increased symptoms with GERD (gastroesophageal reflux disease) symptoms, cataract surgery completion with a complication and a loss of a son recently   Cataract Had eye surgery 10/04/23 for an age related nuclear left cataract, He reports it is doing well except there is settlement that needs to be further removed He is wearing sunglasses to protect and help his eyes. He plans on having right cataract surgery in a few months. He is scheduled to see his regular ophthalmologist at 1 pm today    Nutrition He is trying to maintain his diet. He confirms he does the majority of all the cooking in his home. He cooks a lot to take to his church in [place]  Loss/grief Loss his 82 year old son with a cancer diagnosis last Thursday 10/07/23 His son was a 2 time cancer survivor   Background:   Past Medical History:  Diagnosis Date   Anemia    Chronic kidney disease    esrd- on dialysis   Diabetes mellitus without complication (HCC)    Hepatitis    not sure what type - over 20 years ago   History of kidney stones    Hyperlipidemia    Hypertension     Assessment: Patient Reported Symptoms:  Cognitive Cognitive Status: Alert and oriented to person, place, and time, Insightful and able to interpret abstract concepts, Normal speech and language skills Cognitive/Intellectual Conditions Management [RPT]: None reported or documented in medical history or problem list   Health Maintenance Behaviors: Annual physical exam, Healthy diet, Hobbies, Sleep adequate, Social activities, Spiritual practice(s), Stress management Healing Pattern: Average Health Facilitated  by: Healthy diet, Prayer/meditation, Rest, Stress management  Neurological Neurological Review of Symptoms: Vision changes Neurological Management Strategies: Adequate rest, Medication therapy, Routine screening, Medical device Neurological Self-Management Outcome: 4 (good) Neurological Comment: recent cataract sugery but has some settlement left in his eye Eye drops increased  HEENT HEENT Symptoms Reported: Other: HEENT Management Strategies: Adequate rest, Medication therapy, Routine screening HEENT Self-Management Outcome: 3 (uncertain)    Cardiovascular Cardiovascular Symptoms Reported: Chest pain or discomfort Does patient have uncontrolled Hypertension?: No Cardiovascular Management Strategies: Adequate rest, Medication therapy, Routine screening, Diet modification Cardiovascular Self-Management Outcome: 3 (uncertain) Cardiovascular Comment: believes chest pains are related to GERD  Respiratory Respiratory Symptoms Reported: No symptoms reported Respiratory Self-Management Outcome: 4 (good)  Endocrine Endocrine Symptoms Reported: No symptoms reported Is patient diabetic?: Yes Is patient checking blood sugars at home?: No (relies on results in HD sessions) Endocrine Self-Management Outcome: 4 (good) Endocrine Comment: hgA1c was 5.8 07/08/23  Gastrointestinal Gastrointestinal Symptoms Reported: No symptoms reported Gastrointestinal Self-Management Outcome: 4 (good)    Genitourinary Genitourinary Symptoms Reported: No symptoms reported Genitourinary Management Strategies: Adequate rest, Hemodialysis, Medication therapy, Fluid modification Hemodialysis Schedule: HD Tuesdays, Thursdays, Saturdays Hemodialysis Last Treatment: 10/12/23 Genitourinary Self-Management Outcome: 3 (uncertain)  Integumentary Integumentary Symptoms Reported: No symptoms reported Skin Self-Management Outcome: 4 (good)  Musculoskeletal Musculoskelatal Symptoms Reviewed: Joint pain Additional Musculoskeletal  Details: hx of hip fracture Musculoskeletal Management Strategies: Activity, Adequate rest, Medication therapy, Medical device Musculoskeletal Self-Management Outcome: 4 (good) Falls in the past year?: Yes Number of falls in past year: 1 or less  Was there an injury with Fall?: No Fall Risk Category Calculator: 1 Patient Fall Risk Level: Low Fall Risk Patient at Risk for Falls Due to: History of fall(s), Impaired vision, Orthopedic patient Fall risk Follow up: Falls evaluation completed, Falls prevention discussed  Psychosocial Psychosocial Symptoms Reported: Sadness - if selected complete PHQ 2-9 Additional Psychological Details: loss of his son (oldest 82) after 3 cancer diagnosis Behavioral Management Strategies: Activity, Adequate rest, Support system, Coping strategies Behavioral Health Self-Management Outcome: 3 (uncertain) Major Change/Loss/Stressor/Fears (CP): Death of a loved one, Medical condition, self Techniques to Cope with Loss/Stress/Change: Diversional activities, Spiritual practice(s) Quality of Family Relationships: helpful, supportive Do you feel physically threatened by others?: No      10/12/2023   12:06 PM  Depression screen PHQ 2/9  Decreased Interest 0  Down, Depressed, Hopeless 1  PHQ - 2 Score 1    There were no vitals filed for this visit.  Medications Reviewed Today   Medications were not reviewed in this encounter     Recommendation:   PCP Follow-up Specialty provider follow-up 10/13/23 Ophthalmolgist, Eye surgeon  Continue Current Plan of Care  Follow Up Plan:   Telephone follow up appointment date/time:  8/125 1 pm    Randell Teare L. Ramonita, RN, BSN, CCM [place]  [facility], [facility] RN Care Manager Direct Dial : [phone]  Fax: [phone]

## 2023-10-13 ENCOUNTER — Ambulatory Visit (HOSPITAL_BASED_OUTPATIENT_CLINIC_OR_DEPARTMENT_OTHER): Payer: Self-pay | Admitting: Anesthesiology

## 2023-10-13 ENCOUNTER — Encounter (HOSPITAL_COMMUNITY): Payer: Self-pay | Admitting: Ophthalmology

## 2023-10-13 ENCOUNTER — Ambulatory Visit (HOSPITAL_COMMUNITY)
Admission: RE | Admit: 2023-10-13 | Discharge: 2023-10-13 | Disposition: A | Discharge status: Home / Self Care | Attending: Ophthalmology | Admitting: Ophthalmology

## 2023-10-13 ENCOUNTER — Encounter (HOSPITAL_COMMUNITY): Payer: Self-pay | Admitting: Anesthesiology

## 2023-10-13 ENCOUNTER — Encounter (HOSPITAL_COMMUNITY)
Admission: RE | Disposition: A | Discharge status: Home / Self Care | Payer: Self-pay | Source: Home / Self Care | Attending: Ophthalmology

## 2023-10-13 ENCOUNTER — Other Ambulatory Visit: Payer: Self-pay

## 2023-10-13 DIAGNOSIS — H59022 Cataract (lens) fragments in eye following cataract surgery, left eye: Secondary | ICD-10-CM

## 2023-10-13 DIAGNOSIS — H2511 Age-related nuclear cataract, right eye: Secondary | ICD-10-CM | POA: Diagnosis not present

## 2023-10-13 DIAGNOSIS — I132 Hypertensive heart and chronic kidney disease with heart failure and with stage 5 chronic kidney disease, or end stage renal disease: Secondary | ICD-10-CM | POA: Insufficient documentation

## 2023-10-13 DIAGNOSIS — I509 Heart failure, unspecified: Secondary | ICD-10-CM | POA: Insufficient documentation

## 2023-10-13 DIAGNOSIS — Z9842 Cataract extraction status, left eye: Secondary | ICD-10-CM | POA: Insufficient documentation

## 2023-10-13 DIAGNOSIS — N186 End stage renal disease: Secondary | ICD-10-CM

## 2023-10-13 DIAGNOSIS — I120 Hypertensive chronic kidney disease with stage 5 chronic kidney disease or end stage renal disease: Secondary | ICD-10-CM | POA: Diagnosis not present

## 2023-10-13 DIAGNOSIS — E1122 Type 2 diabetes mellitus with diabetic chronic kidney disease: Secondary | ICD-10-CM | POA: Insufficient documentation

## 2023-10-13 DIAGNOSIS — Z992 Dependence on renal dialysis: Secondary | ICD-10-CM | POA: Diagnosis not present

## 2023-10-13 DIAGNOSIS — I5022 Chronic systolic (congestive) heart failure: Secondary | ICD-10-CM

## 2023-10-13 DIAGNOSIS — E1136 Type 2 diabetes mellitus with diabetic cataract: Secondary | ICD-10-CM | POA: Diagnosis not present

## 2023-10-13 HISTORY — PX: REMOVAL RETAINED LENS: SHX6464

## 2023-10-13 SURGERY — REMOVAL, RETAINED LENS MATTER
Anesthesia: Monitor Anesthesia Care | Site: Eye | Laterality: Left

## 2023-10-13 MED ORDER — ORAL CARE MOUTH RINSE: 15.0000 mL | Freq: Once | OROMUCOSAL | Status: DC

## 2023-10-13 MED ORDER — CHLORHEXIDINE GLUCONATE 0.12 % MT SOLN
15.0000 mL | Freq: Once | OROMUCOSAL | Status: AC
  Administered 2023-10-13: 15 mL via OROMUCOSAL

## 2023-10-13 MED ORDER — SODIUM CHLORIDE 0.9% FLUSH
INTRAVENOUS | Status: DC | PRN
  Administered 2023-10-13: 5 mL via INTRAVENOUS

## 2023-10-13 MED ORDER — STERILE WATER FOR IRRIGATION IR SOLN
Status: DC | PRN
  Administered 2023-10-13: 250 mL

## 2023-10-13 MED ORDER — POVIDONE-IODINE 5 % OP SOLN
OPHTHALMIC | Status: DC | PRN
  Administered 2023-10-13: 1 via OPHTHALMIC

## 2023-10-13 MED ORDER — MIDAZOLAM HCL 2 MG/2ML IJ SOLN
INTRAMUSCULAR | Status: DC | PRN
  Administered 2023-10-13: .5 mg via INTRAVENOUS

## 2023-10-13 MED ORDER — CHLORHEXIDINE GLUCONATE 0.12 % MT SOLN: 15.0000 mL | Freq: Once | OROMUCOSAL | Status: DC

## 2023-10-13 MED ORDER — LACTATED RINGERS IV SOLN: INTRAVENOUS | Status: DC

## 2023-10-13 MED ORDER — MOXIFLOXACIN HCL 5 MG/ML IO SOLN
INTRAOCULAR | Status: AC
  Filled 2023-10-13: qty 1

## 2023-10-13 MED ORDER — MIDAZOLAM HCL 2 MG/2ML IJ SOLN
INTRAMUSCULAR | Status: AC
  Filled 2023-10-13: qty 2

## 2023-10-13 MED ORDER — TETRACAINE HCL 0.5 % OP SOLN
OPHTHALMIC | Status: DC
Start: 2023-10-13 — End: 2023-10-13
  Filled 2023-10-13: qty 4

## 2023-10-13 MED ORDER — SODIUM HYALURONATE 10 MG/ML IO SOLUTION
PREFILLED_SYRINGE | INTRAOCULAR | Status: DC | PRN
  Administered 2023-10-13: .85 mL via INTRAOCULAR

## 2023-10-13 MED ORDER — MOXIFLOXACIN HCL 5 MG/ML IO SOLN
INTRAOCULAR | Status: DC | PRN
  Administered 2023-10-13: .2 mL via INTRACAMERAL

## 2023-10-13 MED ORDER — BSS IO SOLN
INTRAOCULAR | Status: DC | PRN
  Administered 2023-10-13: 15 mL via INTRAOCULAR
  Administered 2023-10-13: 500 mL via INTRAOCULAR

## 2023-10-13 MED ORDER — TETRACAINE HCL 0.5 % OP SOLN
1.0000 [drp] | OPHTHALMIC | Status: AC | PRN
  Administered 2023-10-13 (×3): 1 [drp] via OPHTHALMIC
  Filled 2023-10-13: qty 4

## 2023-10-13 MED ORDER — PHENYLEPHRINE-KETOROLAC 1-0.3 % IO SOLN
INTRAOCULAR | Status: AC
  Filled 2023-10-13: qty 4

## 2023-10-13 MED ORDER — LIDOCAINE HCL (PF) 1 % IJ SOLN
INTRAMUSCULAR | Status: AC
  Filled 2023-10-13: qty 2

## 2023-10-13 SURGICAL SUPPLY — 10 items
CATARACT SUITE SIGHTPATH (MISCELLANEOUS) ×1 IMPLANT
CLOTH BEACON ORANGE TIMEOUT ST (SAFETY) ×1 IMPLANT
EYE SHIELD UNIVERSAL CLEAR (GAUZE/BANDAGES/DRESSINGS) IMPLANT
FEE CATARACT SUITE SIGHTPATH (MISCELLANEOUS) ×1 IMPLANT
GLOVE BIOGEL PI IND STRL 7.0 (GLOVE) ×2 IMPLANT
PAD ARMBOARD POSITIONER FOAM (MISCELLANEOUS) ×1 IMPLANT
SYR TB 1ML LL NO SAFETY (SYRINGE) ×1 IMPLANT
TAPE SURG TRANSPORE 1 IN (GAUZE/BANDAGES/DRESSINGS) IMPLANT
TIP IRRIGATON/ASPIRATION (MISCELLANEOUS) IMPLANT
WATER STERILE IRR 250ML POUR (IV SOLUTION) ×1 IMPLANT

## 2023-10-13 NOTE — Anesthesia Postprocedure Evaluation (Signed)
 Anesthesia Post Note  Patient: Trevor [name]  Procedure(s) Performed: REMOVAL, RETAINED LENS MATTER (Left: Eye)  Patient location during evaluation: PACU Anesthesia Type: MAC Level of consciousness: awake and alert Pain management: pain level controlled Vital Signs Assessment: post-procedure vital signs reviewed and stable Respiratory status: spontaneous breathing, nonlabored ventilation, respiratory function stable and patient connected to nasal cannula oxygen Cardiovascular status: stable and blood pressure returned to baseline Postop Assessment: no apparent nausea or vomiting Anesthetic complications: no   No notable events documented.   Last Vitals:  Vitals:   10/13/23 0649 10/13/23 0742  BP: (!) 151/58 (!) 127/53  Pulse: 60 (!) 57  Resp: 15 16  Temp: 36.6 C (!) 36.4 C  SpO2: 100% 100%    Last Pain:  Vitals:   10/13/23 0742  TempSrc: Oral  PainSc: 0-No pain                 Andrea Limes

## 2023-10-13 NOTE — Transfer of Care (Signed)
 Immediate Anesthesia Transfer of Care Note  Patient: Trevor [name]  Procedure(s) Performed: REMOVAL, RETAINED LENS MATTER (Left: Eye)  Patient Location: Short Stay  Anesthesia Type:MAC  Level of Consciousness: awake, alert , and oriented  Airway & Oxygen Therapy: Patient Spontanous Breathing  Post-op Assessment: Report given to RN and Post -op Vital signs reviewed and stable  Post vital signs: Reviewed and stable  Last Vitals:  Vitals Value Taken Time  BP 127/53 10/13/23 07:42  Temp 36.4 C 10/13/23 07:42  Pulse 57 10/13/23 07:42  Resp 16 10/13/23 07:42  SpO2 100 % 10/13/23 07:42    Last Pain:  Vitals:   10/13/23 0742  TempSrc: Oral  PainSc: 0-No pain         Complications: No notable events documented.

## 2023-10-13 NOTE — Op Note (Signed)
 Date of procedure: 10/13/23  Pre-operative diagnosis: Retained natural lens fragment after cataract surgery, left eye  Post-operative diagnosis: Retained natural lens fragment after cataract surgery, left eye  Procedure: Removal of retained lens fragment, left eye  Attending surgeon: Lynwood LABOR. Melek Pownall, MD, MA  Anesthesia: MAC, Topical Tetracaine   Complications: None  Estimated Blood Loss: <41mL (minimal)  Specimens: None  Implants: None  Indications:  Retained natural lens fragment after cataract surgery, left eye  Procedure:  The patient was seen and identified in the pre-operative area. The operative eye was identified and dilated.  The operative eye was marked.  Topical anesthesia was administered to the operative eye.     The patient was then to the operative suite and placed in the supine position.  A timeout was performed confirming the patient, procedure to be performed, and all other relevant information.   The patient's face was prepped and draped in the usual fashion for intra-ocular surgery.  A lid speculum was placed into the operative eye and the surgical microscope moved into place and focused.  An superotemporal paracentesis was created using a 20 gauge paracentesis blade.  Viscoelastic was injected into the anterior chamber.  An inferotemporal paracentesis was made utilizing the previous incision. The lens fragment was removed using the bimanual irrigation and aspiration handpieces. The irrigation/aspiration handpiece was used to remove any remaining viscoelastic.  The paracentesis wounds were then hydrated and checked with Weck-Cels to be watertight. 0.1mL of Moxfloxacin was injected into the anterior chamber. The lid-speculum was removed.  The drape was removed.  The patient's face was cleaned with a wet and dry 4x4.    A clear shield was taped over the eye. The patient was taken to the post-operative care unit in good condition, having tolerated the procedure  well.  Post-Op Instructions: The patient will follow up at [facility] for a same day post-operative evaluation and will receive all other orders and instructions.

## 2023-10-13 NOTE — Interval H&P Note (Signed)
 History and Physical Interval Note:  10/13/2023 7:15 AM  Trevor [name]  has presented today for surgery, with the diagnosis of retained lens material following cataract surgery, left eye.  The various methods of treatment have been discussed with the patient and family. After consideration of risks, benefits and other options for treatment, the patient has consented to  Procedure(s): REMOVAL, RETAINED LENS MATTER (Left) as a surgical intervention.  The patient's history has been reviewed, patient examined, no change in status, stable for surgery.  I have reviewed the patient's chart and labs.  Questions were answered to the patient's satisfaction.     HARRIE AGENT

## 2023-10-13 NOTE — Anesthesia Preprocedure Evaluation (Addendum)
 Anesthesia Evaluation  Patient identified by MRN, date of birth, ID band Patient awake    Reviewed: Allergy & Precautions, H&P , NPO status , Patient's Chart, lab work & pertinent test results  Airway Mallampati: II  TM Distance: >3 FB Neck ROM: Full    Dental no notable dental hx.    Pulmonary shortness of breath   Pulmonary exam normal breath sounds clear to auscultation       Cardiovascular hypertension, + CAD, + Past MI and +CHF  Normal cardiovascular exam+ dysrhythmias  Rhythm:Regular Rate:Normal  Ef 50%   Neuro/Psych negative neurological ROS  negative psych ROS   GI/Hepatic ,GERD  ,,(+) Hepatitis -  Endo/Other  diabetes    Renal/GU DialysisRenal diseaseDialysis MWF Last dialisis 6/30  negative genitourinary   Musculoskeletal negative musculoskeletal ROS (+)    Abdominal   Peds negative pediatric ROS (+)  Hematology  (+) Blood dyscrasia, anemia   Anesthesia Other Findings   Reproductive/Obstetrics negative OB ROS                              Anesthesia Physical Anesthesia Plan  ASA: 4  Anesthesia Plan: MAC   Post-op Pain Management:    Induction:   PONV Risk Score and Plan:   Airway Management Planned: Nasal Cannula  Additional Equipment:   Intra-op Plan:   Post-operative Plan:   Informed Consent: I have reviewed the patients History and Physical, chart, labs and discussed the procedure including the risks, benefits and alternatives for the proposed anesthesia with the patient or authorized representative who has indicated his/her understanding and acceptance.       Plan Discussed with: CRNA  Anesthesia Plan Comments:          Anesthesia Quick Evaluation

## 2023-10-13 NOTE — Discharge Instructions (Signed)
 Please discharge patient when stable, will follow up today with Dr. June Leap at the [facility] office immediately following discharge.  Leave shield in place until visit.  All paperwork with discharge instructions will be given at the office.  [facility] Address:  [address]  [place], [place] [address]

## 2023-10-14 ENCOUNTER — Encounter (HOSPITAL_COMMUNITY): Payer: Self-pay | Admitting: Ophthalmology

## 2023-10-15 DIAGNOSIS — N186 End stage renal disease: Secondary | ICD-10-CM | POA: Diagnosis not present

## 2023-10-15 DIAGNOSIS — Z992 Dependence on renal dialysis: Secondary | ICD-10-CM | POA: Diagnosis not present

## 2023-10-18 DIAGNOSIS — N186 End stage renal disease: Secondary | ICD-10-CM | POA: Diagnosis not present

## 2023-10-18 DIAGNOSIS — Z992 Dependence on renal dialysis: Secondary | ICD-10-CM | POA: Diagnosis not present

## 2023-10-18 NOTE — Patient Outreach (Signed)
 Complex Care Management   Visit Note  10/18/2023 late entry note for 09/09/23  Name:  Trevor [name] MRN: [id number] DOB: [birth year]  Situation: Referral received for Complex Care Management related to Acute MI and post hospital acute needs management I obtained verbal consent from Patient.  Visit completed with Zachary VEAR Burr  on the phone  Background:   Past Medical History:  Diagnosis Date   Anemia    Chronic kidney disease    esrd- on dialysis   Diabetes mellitus without complication (HCC)    Hepatitis    not sure what type - over 20 years ago   History of kidney stones    Hyperlipidemia    Hypertension     Assessment: Patient Reported Symptoms:  Cognitive Cognitive Status: Able to follow simple commands, Alert and oriented to person, place, and time, Insightful and able to interpret abstract concepts, Normal speech and language skills Cognitive/Intellectual Conditions Management [RPT]: None reported or documented in medical history or problem list   Health Maintenance Behaviors: Annual physical exam, Healthy diet, Sleep adequate, Hobbies, Spiritual practice(s), Social activities Healing Pattern: Average Health Facilitated by: Healthy diet, Prayer/meditation, Rest  Neurological Neurological Review of Symptoms: No symptoms reported Neurological Management Strategies: Adequate rest, Medical device, Medication therapy, Routine screening Neurological Self-Management Outcome: 4 (good)  HEENT HEENT Symptoms Reported: No symptoms reported HEENT Management Strategies: Adequate rest HEENT Self-Management Outcome: 4 (good)    Cardiovascular Cardiovascular Symptoms Reported: No symptoms reported Does patient have uncontrolled Hypertension?: No Cardiovascular Self-Management Outcome: 4 (good)  Respiratory Respiratory Symptoms Reported: No symptoms reported Respiratory Self-Management Outcome: 4 (good)  Endocrine Endocrine Symptoms Reported: No symptoms reported Is patient  diabetic?: Yes Is patient checking blood sugars at home?: No Endocrine Self-Management Outcome: 4 (good) Endocrine Comment: a1c 5.8  Gastrointestinal Gastrointestinal Symptoms Reported: Abdominal pain or discomfort, Constipation Additional Gastrointestinal Details: sweats when need to have a stool Gastrointestinal Management Strategies: Medication therapy, Adequate rest, Diet modification Gastrointestinal Self-Management Outcome: 4 (good)    Genitourinary Genitourinary Symptoms Reported: Other Other Genitourinary Symptoms: sweating at  HD on Wednesday 09/08/23 Genitourinary Management Strategies: Adequate rest, Medication therapy, Fluid modification, Hemodialysis Hemodialysis Schedule: Tuesday, thursday, Saturday Hemodialysis Last Treatment: 09/08/23 Genitourinary Self-Management Outcome: 3 (uncertain)  Integumentary Integumentary Symptoms Reported: No symptoms reported Skin Self-Management Outcome: 4 (good)  Musculoskeletal Musculoskelatal Symptoms Reviewed: Joint pain Musculoskeletal Management Strategies: Activity, Adequate rest, Medical device, Medication therapy Musculoskeletal Self-Management Outcome: 4 (good)      Psychosocial Psychosocial Symptoms Reported: No symptoms reported     Quality of Family Relationships: helpful, supportive Do you feel physically threatened by others?: No      10/12/2023   12:06 PM  Depression screen PHQ 2/9  Decreased Interest 0  Down, Depressed, Hopeless 1  PHQ - 2 Score 1    There were no vitals filed for this visit.  Medications Reviewed Today   Medications were not reviewed in this encounter     Recommendation:   PCP Follow-up Continue Current Plan of Care  Follow Up Plan:   Telephone follow up appointment date/time:  10/12/23  Suzen L. Ramonita, RN, BSN, CCM [place]  [facility], [facility] RN Care Manager Direct Dial : [phone]  Fax: [phone]

## 2023-10-19 ENCOUNTER — Encounter (HOSPITAL_COMMUNITY)
Admission: RE | Admit: 2023-10-19 | Discharge: 2023-10-19 | Disposition: A | Discharge status: Home / Self Care | Source: Ambulatory Visit | Attending: Ophthalmology | Admitting: Ophthalmology

## 2023-10-19 DIAGNOSIS — N186 End stage renal disease: Secondary | ICD-10-CM

## 2023-10-19 NOTE — H&P (Signed)
 Surgical History & Physical  Patient Name: Trevor [name]  DOB: 06/25/1940  Surgery: Cataract extraction with intraocular lens implant phacoemulsification; Right Eye Surgeon: Lynwood Hermann MD Surgery Date: 10/25/2023 Pre-Op  Date: 10/11/2023  HPI: A 69 Yr. old male patient 1. The patient is returning after cataract surgery. The left eye is affected. Status post cataract surgery, which began 1 week ago: Since the last visit, the affected area feels improvement. The patient's vision is stable. The condition's severity is constant. Patient is following medication instructions. 2. The patient is returning for a cataract follow-up of the right eye. Since the last visit, the affected area is tolerating. The patient's vision is blurry. The condition's severity is constant. Patient is not taking medications. This is negatively affecting the patient's quality of life and the patient is unable to function adequately in life with the current level of vision. HPI Completed by Dr. Lynwood Hermann  Medical History:  Dialysis Heart Problem  Review of Systems Negative Allergic/Immunologic Heart problems Cardiovascular Negative Constitutional Negative Ear, Nose, Mouth & Throat Negative Endocrine Negative Eyes Negative Gastrointestinal Negative Genitourinary Negative Hematologic/Lymphatic Negative Integumentary Negative Musculoskeletal Negative Neurological Negative Psychiatry Negative Respiratory  Social Never smoked  Medication Prednisolon-moxiflox-bromf(PF),  Amlodipine , Hydralazine , Carvedilol , Nitroglycerin , Ondansetron  HCl, Erythromycin, Albuterol sulfate, Isosorbide  mononitrate, Rosuvastatin   Sx/Procedures Phaco c IOL OS-dextenza , Removal of retained lens fragment,  Hip Replacement- both, Stent in heart/heart attack  Drug Allergies  NKDA  History & Physical: Heent: cataract NECK: supple without bruits LUNGS: lungs clear to auscultation CV: regular rate and rhythm Abdomen: soft  and non-tender  Impression & Plan: Assessment: 1.  LENS FRAGMENTS IN EYE FOLLOWING CATARACT SURGERY; Left Eye (H59.022) 2.  CATARACT EXTRACTION STATUS; Left Eye (Z98.42) 3.  NUCLEAR SCLEROSIS AGE RELATED; , Right Eye (H25.11)  Plan: 1.  Found on exam. No inflammation and normal IOP at this point. Recommend surgical removal. Risks, benefits, alternatives, and complications discussed. Please schedule procedure.  2.  1 week after cataract surgery. As above. Call with any problems or concerns. Continue Pred-Mox-Brom Combo drop 4x/day until follow up..  3.  Cataract accounts for the patient's decreased vision. This visual impairment is not correctable with a tolerable change in glasses or contact lenses. Cataract surgery with an implantation of a new lens should significantly improve the visual and functional status of the patient. Discussed all risks, benefits, alternatives, and potential complications. Discussed the procedures and recovery. Patient desires to have surgery. A-scan ordered and performed today for intra-ocular lens calculations. The surgery will be performed in order to improve vision for driving, reading, and for eye examinations. Recommend phacoemulsification with intra-ocular lens. Recommend Dextenza  for post-operative pain and inflammation. History of refractive Surgery: None Use of Eye Pressure Lowering Drops: No Right Eye. Surgery required to correct imbalance of vision. Dilates well - shugarcaine or Lidocaine +Omidira by protocol Standard IOL.

## 2023-10-20 DIAGNOSIS — N186 End stage renal disease: Secondary | ICD-10-CM | POA: Diagnosis not present

## 2023-10-20 DIAGNOSIS — Z992 Dependence on renal dialysis: Secondary | ICD-10-CM | POA: Diagnosis not present

## 2023-10-21 DIAGNOSIS — H2511 Age-related nuclear cataract, right eye: Secondary | ICD-10-CM | POA: Diagnosis not present

## 2023-10-22 DIAGNOSIS — N186 End stage renal disease: Secondary | ICD-10-CM | POA: Diagnosis not present

## 2023-10-22 DIAGNOSIS — Z992 Dependence on renal dialysis: Secondary | ICD-10-CM | POA: Diagnosis not present

## 2023-10-25 ENCOUNTER — Ambulatory Visit (HOSPITAL_COMMUNITY)
Admission: RE | Admit: 2023-10-25 | Discharge: 2023-10-25 | Disposition: A | Discharge status: Home / Self Care | Attending: Ophthalmology | Admitting: Ophthalmology

## 2023-10-25 ENCOUNTER — Ambulatory Visit (HOSPITAL_COMMUNITY): Admitting: Anesthesiology

## 2023-10-25 ENCOUNTER — Encounter (HOSPITAL_COMMUNITY): Payer: Self-pay | Admitting: Ophthalmology

## 2023-10-25 ENCOUNTER — Other Ambulatory Visit: Payer: Self-pay

## 2023-10-25 ENCOUNTER — Encounter (HOSPITAL_COMMUNITY)
Admission: RE | Disposition: A | Discharge status: Home / Self Care | Payer: Self-pay | Source: Home / Self Care | Attending: Ophthalmology

## 2023-10-25 DIAGNOSIS — I252 Old myocardial infarction: Secondary | ICD-10-CM | POA: Insufficient documentation

## 2023-10-25 DIAGNOSIS — N189 Chronic kidney disease, unspecified: Secondary | ICD-10-CM | POA: Insufficient documentation

## 2023-10-25 DIAGNOSIS — I130 Hypertensive heart and chronic kidney disease with heart failure and stage 1 through stage 4 chronic kidney disease, or unspecified chronic kidney disease: Secondary | ICD-10-CM | POA: Diagnosis not present

## 2023-10-25 DIAGNOSIS — Z992 Dependence on renal dialysis: Secondary | ICD-10-CM | POA: Insufficient documentation

## 2023-10-25 DIAGNOSIS — E1136 Type 2 diabetes mellitus with diabetic cataract: Secondary | ICD-10-CM | POA: Insufficient documentation

## 2023-10-25 DIAGNOSIS — I509 Heart failure, unspecified: Secondary | ICD-10-CM | POA: Diagnosis not present

## 2023-10-25 DIAGNOSIS — I5020 Unspecified systolic (congestive) heart failure: Secondary | ICD-10-CM | POA: Diagnosis not present

## 2023-10-25 DIAGNOSIS — N186 End stage renal disease: Secondary | ICD-10-CM | POA: Diagnosis not present

## 2023-10-25 DIAGNOSIS — I2510 Atherosclerotic heart disease of native coronary artery without angina pectoris: Secondary | ICD-10-CM | POA: Diagnosis not present

## 2023-10-25 DIAGNOSIS — H2511 Age-related nuclear cataract, right eye: Secondary | ICD-10-CM | POA: Diagnosis not present

## 2023-10-25 DIAGNOSIS — E1122 Type 2 diabetes mellitus with diabetic chronic kidney disease: Secondary | ICD-10-CM | POA: Diagnosis not present

## 2023-10-25 DIAGNOSIS — K219 Gastro-esophageal reflux disease without esophagitis: Secondary | ICD-10-CM | POA: Diagnosis not present

## 2023-10-25 DIAGNOSIS — Z955 Presence of coronary angioplasty implant and graft: Secondary | ICD-10-CM | POA: Diagnosis not present

## 2023-10-25 DIAGNOSIS — I132 Hypertensive heart and chronic kidney disease with heart failure and with stage 5 chronic kidney disease, or end stage renal disease: Secondary | ICD-10-CM | POA: Diagnosis not present

## 2023-10-25 DIAGNOSIS — I120 Hypertensive chronic kidney disease with stage 5 chronic kidney disease or end stage renal disease: Secondary | ICD-10-CM | POA: Diagnosis not present

## 2023-10-25 DIAGNOSIS — H5711 Ocular pain, right eye: Secondary | ICD-10-CM | POA: Insufficient documentation

## 2023-10-25 HISTORY — PX: INSERTION, STENT, DRUG-ELUTING, LACRIMAL CANALICULUS: SHX7453

## 2023-10-25 HISTORY — PX: CATARACT EXTRACTION W/PHACO: SHX586

## 2023-10-25 SURGERY — PHACOEMULSIFICATION, CATARACT, WITH IOL INSERTION
Anesthesia: Monitor Anesthesia Care | Site: Eye | Laterality: Right

## 2023-10-25 MED ORDER — STERILE WATER FOR IRRIGATION IR SOLN
Status: DC | PRN
  Administered 2023-10-25: 1

## 2023-10-25 MED ORDER — MOXIFLOXACIN HCL 5 MG/ML IO SOLN
INTRAOCULAR | Status: DC | PRN
  Administered 2023-10-25: .3 mL via INTRACAMERAL

## 2023-10-25 MED ORDER — SODIUM CHLORIDE 0.9% FLUSH
INTRAVENOUS | Status: DC | PRN
  Administered 2023-10-25: 5 mL via INTRAVENOUS

## 2023-10-25 MED ORDER — LIDOCAINE HCL 3.5 % OP GEL
1.0000 | Freq: Once | OPHTHALMIC | Status: AC
  Administered 2023-10-25: 1 via OPHTHALMIC

## 2023-10-25 MED ORDER — PHENYLEPHRINE-KETOROLAC 1-0.3 % IO SOLN
INTRAOCULAR | Status: DC | PRN
  Administered 2023-10-25: 500 mL via OPHTHALMIC

## 2023-10-25 MED ORDER — SODIUM CHLORIDE 0.9% FLUSH: 3.0000 mL | INTRAVENOUS | Status: DC | PRN

## 2023-10-25 MED ORDER — LACTATED RINGERS IV SOLN: INTRAVENOUS | Status: DC

## 2023-10-25 MED ORDER — MIDAZOLAM HCL 2 MG/2ML IJ SOLN
INTRAMUSCULAR | Status: DC | PRN
Start: 2023-10-25 — End: 2023-10-25
  Administered 2023-10-25: 1 mg via INTRAVENOUS

## 2023-10-25 MED ORDER — LIDOCAINE HCL (PF) 1 % IJ SOLN
INTRAMUSCULAR | Status: DC | PRN
  Administered 2023-10-25: 1 mL

## 2023-10-25 MED ORDER — DEXAMETHASONE 0.4 MG OP INST
VAGINAL_INSERT | OPHTHALMIC | Status: DC | PRN
Start: 2023-10-25 — End: 2023-10-25
  Administered 2023-10-25: .4 mg via OPHTHALMIC

## 2023-10-25 MED ORDER — DEXAMETHASONE 0.4 MG OP INST
VAGINAL_INSERT | OPHTHALMIC | Status: AC
  Filled 2023-10-25: qty 1

## 2023-10-25 MED ORDER — SODIUM CHLORIDE 0.9% FLUSH: 3.0000 mL | Freq: Two times a day (BID) | INTRAVENOUS | Status: DC

## 2023-10-25 MED ORDER — SODIUM HYALURONATE 23MG/ML IO SOSY
PREFILLED_SYRINGE | INTRAOCULAR | Status: DC | PRN
  Administered 2023-10-25: .6 mL via INTRAOCULAR

## 2023-10-25 MED ORDER — POVIDONE-IODINE 5 % OP SOLN
OPHTHALMIC | Status: DC | PRN
  Administered 2023-10-25: 1 via OPHTHALMIC

## 2023-10-25 MED ORDER — TROPICAMIDE 1 % OP SOLN
1.0000 [drp] | OPHTHALMIC | Status: AC | PRN
Start: 2023-10-25 — End: 2023-10-25
  Administered 2023-10-25 (×3): 1 [drp] via OPHTHALMIC

## 2023-10-25 MED ORDER — PHENYLEPHRINE HCL 2.5 % OP SOLN
1.0000 [drp] | OPHTHALMIC | Status: AC | PRN
Start: 2023-10-25 — End: 2023-10-25
  Administered 2023-10-25 (×3): 1 [drp] via OPHTHALMIC

## 2023-10-25 MED ORDER — BSS IO SOLN
INTRAOCULAR | Status: DC | PRN
Start: 2023-10-25 — End: 2023-10-25
  Administered 2023-10-25: 15 mL via INTRAOCULAR

## 2023-10-25 MED ORDER — MIDAZOLAM HCL 2 MG/2ML IJ SOLN
INTRAMUSCULAR | Status: AC
  Filled 2023-10-25: qty 2

## 2023-10-25 MED ORDER — TETRACAINE HCL 0.5 % OP SOLN
1.0000 [drp] | OPHTHALMIC | Status: AC | PRN
  Administered 2023-10-25 (×3): 1 [drp] via OPHTHALMIC

## 2023-10-25 MED ORDER — SODIUM HYALURONATE 10 MG/ML IO SOLUTION
PREFILLED_SYRINGE | INTRAOCULAR | Status: DC | PRN
Start: 2023-10-25 — End: 2023-10-25
  Administered 2023-10-25: .85 mL via INTRAOCULAR

## 2023-10-25 SURGICAL SUPPLY — 12 items
CATARACT SUITE SIGHTPATH (MISCELLANEOUS) ×1 IMPLANT
CLOTH BEACON ORANGE TIMEOUT ST (SAFETY) ×1 IMPLANT
EYE SHIELD UNIVERSAL CLEAR (GAUZE/BANDAGES/DRESSINGS) IMPLANT
FEE CATARACT SUITE SIGHTPATH (MISCELLANEOUS) ×1 IMPLANT
GLOVE BIOGEL PI IND STRL 7.0 (GLOVE) ×2 IMPLANT
LENS IOL TECNIS EYHANCE 18.0 (Intraocular Lens) IMPLANT
NDL HYPO 18GX1.5 BLUNT FILL (NEEDLE) ×1 IMPLANT
NEEDLE HYPO 18GX1.5 BLUNT FILL (NEEDLE) ×1 IMPLANT
PAD ARMBOARD POSITIONER FOAM (MISCELLANEOUS) ×1 IMPLANT
SYR TB 1ML LL NO SAFETY (SYRINGE) ×1 IMPLANT
TAPE SURG TRANSPORE 1 IN (GAUZE/BANDAGES/DRESSINGS) IMPLANT
WATER STERILE IRR 250ML POUR (IV SOLUTION) ×1 IMPLANT

## 2023-10-25 NOTE — Interval H&P Note (Signed)
 History and Physical Interval Note:  10/25/2023 9:31 AM  Trevor [name]  has presented today for surgery, with the diagnosis of nuclear sclerotic cataract, right eye.  The various methods of treatment have been discussed with the patient and family. After consideration of risks, benefits and other options for treatment, the patient has consented to  Procedure(s): PHACOEMULSIFICATION, CATARACT, WITH IOL INSERTION (Right) INSERTION, STENT, DRUG-ELUTING, LACRIMAL CANALICULUS (Right) as a surgical intervention.  The patient's history has been reviewed, patient examined, no change in status, stable for surgery.  I have reviewed the patient's chart and labs.  Questions were answered to the patient's satisfaction.     HARRIE AGENT

## 2023-10-25 NOTE — Anesthesia Procedure Notes (Signed)
 Date/Time: 10/25/2023 9:40 AM  Performed by: Barbarann Verneita RAMAN, CRNAPre-anesthesia Checklist: Patient identified, Emergency Drugs available, Suction available, Timeout performed and Patient being monitored Patient Re-evaluated:Patient Re-evaluated prior to induction Oxygen Delivery Method: Nasal Cannula

## 2023-10-25 NOTE — Progress Notes (Signed)
 Dr. Landry shown lab results from [facility].

## 2023-10-25 NOTE — Op Note (Signed)
 Date of procedure: 10/25/23  Pre-operative diagnosis:  Visually significant age-related nuclear cataract, Right Eye (H25.11)  Post-operative diagnosis:   1. Visually significant age-related nuclear cataract, Right Eye (H25.11) 2. Pain and inflammation following cataract surgery Right Eye (H57.11)  Procedure:  Removal of cataract via phacoemulsification and insertion of intra-ocular lens [employer] DIB00 +18.0D into the capsular bag of the Right Eye 2. Placement of Dextenza  insert, Right Eye  Attending surgeon: Lynwood LABOR. Valgene Deloatch, MD, MA  Anesthesia: MAC, Topical Akten   Complications: None  Estimated Blood Loss: <58mL (minimal)  Specimens: None  Implants: As above  Indications:  Visually significant age-related cataract, Right Eye  Procedure:  The patient was seen and identified in the pre-operative area. The operative eye was identified and dilated.  The operative eye was marked.  Topical anesthesia was administered to the operative eye.     The patient was then to the operative suite and placed in the supine position.  A timeout was performed confirming the patient, procedure to be performed, and all other relevant information.   The patient's face was prepped and draped in the usual fashion for intra-ocular surgery.  A lid speculum was placed into the operative eye and the surgical microscope moved into place and focused.  A superotemporal paracentesis was created using a 20 gauge paracentesis blade. Omidria  was injected into the anterior chamber. Shugarcaine was injected into the anterior chamber.  Viscoelastic was injected into the anterior chamber.  A temporal clear-corneal main wound incision was created using a 2.31mm microkeratome.  A continuous curvilinear capsulorrhexis was initiated using an irrigating cystitome and completed using capsulorrhexis forceps.  Hydrodissection and hydrodeliniation were performed.  Viscoelastic was injected into the anterior chamber.  A  phacoemulsification handpiece and a chopper as a second instrument were used to remove the nucleus and epinucleus. The irrigation/aspiration handpiece was used to remove any remaining cortical material.   The capsular bag was reinflated with viscoelastic, checked, and found to be intact.  The intraocular lens was inserted into the capsular bag.  The irrigation/aspiration handpiece was used to remove any remaining viscoelastic.  The clear corneal wound and paracentesis wounds were then hydrated and checked with Weck-Cels to be watertight. 0.1mL of moxifloxacin  was injected into the anterior chamber.  The lid-speculum was removed. The lower punctum was dilated. A Dextenza  implant was placed in the lower canaliculus without complication.  The drape was removed.  The patient's face was cleaned with a wet and dry 4x4. A clear shield was taped over the eye. The patient was taken to the post-operative care unit in good condition, having tolerated the procedure well.  Post-Op Instructions: The patient will follow up at [facility] for a same day post-operative evaluation and will receive all other orders and instructions.

## 2023-10-25 NOTE — Discharge Instructions (Addendum)
 Please discharge patient when stable, will follow up today with Dr. June Leap at the [facility] office immediately following discharge.  Leave shield in place until visit.  All paperwork with discharge instructions will be given at the office.  [facility] Address:  [address]  [place], [place] [address]

## 2023-10-25 NOTE — Anesthesia Preprocedure Evaluation (Addendum)
 Anesthesia Evaluation  Patient identified by MRN, date of birth, ID band Patient awake    Reviewed: Allergy & Precautions, H&P , NPO status , Patient's Chart, lab work & pertinent test results  Airway Mallampati: II  TM Distance: >3 FB Neck ROM: Full    Dental no notable dental hx.    Pulmonary shortness of breath   Pulmonary exam normal breath sounds clear to auscultation       Cardiovascular hypertension, + CAD, + Past MI, + Cardiac Stents and +CHF  Normal cardiovascular exam+ dysrhythmias  Rhythm:Regular Rate:Normal  Ef 50%   Neuro/Psych negative neurological ROS  negative psych ROS   GI/Hepatic ,GERD  ,,(+) Hepatitis -  Endo/Other  diabetes, Type 2    Renal/GU DialysisRenal diseaseDialysis MWF Last dialisis 6/30  negative genitourinary   Musculoskeletal negative musculoskeletal ROS (+)    Abdominal   Peds negative pediatric ROS (+)  Hematology  (+) Blood dyscrasia, anemia   Anesthesia Other Findings K 5.5  Reproductive/Obstetrics negative OB ROS                              Anesthesia Physical Anesthesia Plan  ASA: 4  Anesthesia Plan: MAC   Post-op Pain Management: Minimal or no pain anticipated   Induction:   PONV Risk Score and Plan:   Airway Management Planned: Nasal Cannula  Additional Equipment: None  Intra-op Plan:   Post-operative Plan:   Informed Consent: I have reviewed the patients History and Physical, chart, labs and discussed the procedure including the risks, benefits and alternatives for the proposed anesthesia with the patient or authorized representative who has indicated his/her understanding and acceptance.       Plan Discussed with: CRNA  Anesthesia Plan Comments:          Anesthesia Quick Evaluation

## 2023-10-25 NOTE — Anesthesia Postprocedure Evaluation (Signed)
 Anesthesia Post Note  Patient: JOLLY BLEICHER  Procedure(s) Performed: PHACOEMULSIFICATION, CATARACT, WITH IOL INSERTION (Right: Eye) INSERTION, STENT, DRUG-ELUTING, LACRIMAL CANALICULUS (Right: Eye)  Patient location during evaluation: Phase II Anesthesia Type: MAC Level of consciousness: awake and alert Pain management: pain level controlled Vital Signs Assessment: post-procedure vital signs reviewed and stable Respiratory status: spontaneous breathing, nonlabored ventilation and respiratory function stable Cardiovascular status: stable and blood pressure returned to baseline Postop Assessment: no apparent nausea or vomiting Anesthetic complications: no   There were no known notable events for this encounter.   Last Vitals:  Vitals:   10/25/23 0837 10/25/23 1002  BP: (!) 142/38 (!) 142/54  Pulse: 60 (!) 57  Resp: 15 12  Temp: 36.6 C (!) 36.4 C  SpO2: 99% 100%    Last Pain:  Vitals:   10/25/23 1002  TempSrc: Oral  PainSc: 0-No pain                 Trinitee Horgan L Lela Murfin

## 2023-10-25 NOTE — Transfer of Care (Addendum)
 Immediate Anesthesia Transfer of Care Note  Patient: Trevor [name]  Procedure(s) Performed: PHACOEMULSIFICATION, CATARACT, WITH IOL INSERTION (Right: Eye) INSERTION, STENT, DRUG-ELUTING, LACRIMAL CANALICULUS (Right: Eye)  Patient Location: Short Stay  Anesthesia Type:MAC  Level of Consciousness: awake, alert , and oriented  Airway & Oxygen Therapy: Patient Spontanous Breathing  Post-op Assessment: Report given to RN and Post -op Vital signs reviewed and stable  Post vital signs: Reviewed and stable  Last Vitals:  Vitals Value Taken Time  BP 142/54 10/25/23 10:02  Temp 36.4 C 10/25/23 10:02  Pulse 57 10/25/23 10:02  Resp 12 10/25/23 10:02  SpO2 100 % 10/25/23 10:02    Last Pain:  Vitals:   10/25/23 1002  TempSrc: Oral  PainSc: 0-No pain         Complications: No notable events documented.

## 2023-10-26 ENCOUNTER — Encounter (HOSPITAL_COMMUNITY): Payer: Self-pay | Admitting: Ophthalmology

## 2023-10-26 LAB — POCT I-STAT, CHEM 8
BUN: 75 mg/dL — ABNORMAL HIGH (ref 8–23)
BUN: 96 mg/dL — ABNORMAL HIGH (ref 8–23)
Calcium, Ion: 1 mmol/L — ABNORMAL LOW (ref 1.15–1.40)
Calcium, Ion: 1.06 mmol/L — ABNORMAL LOW (ref 1.15–1.40)
Chloride: 99 mmol/L (ref 98–111)
Chloride: 99 mmol/L (ref 98–111)
Creatinine, Ser: 9.8 mg/dL — ABNORMAL HIGH (ref 0.61–1.24)
Creatinine, Ser: 11.9 mg/dL — ABNORMAL HIGH (ref 0.61–1.24)
Glucose, Bld: 138 mg/dL — ABNORMAL HIGH (ref 70–99)
Glucose, Bld: 140 mg/dL — ABNORMAL HIGH (ref 70–99)
HCT: 36 % — ABNORMAL LOW (ref 39.0–52.0)
HCT: 38 % — ABNORMAL LOW (ref 39.0–52.0)
Hemoglobin: 12.2 g/dL — ABNORMAL LOW (ref 13.0–17.0)
Hemoglobin: 12.9 g/dL — ABNORMAL LOW (ref 13.0–17.0)
Potassium: 5.2 mmol/L — ABNORMAL HIGH (ref 3.5–5.1)
Potassium: 5.5 mmol/L — ABNORMAL HIGH (ref 3.5–5.1)
Sodium: 133 mmol/L — ABNORMAL LOW (ref 135–145)
Sodium: 136 mmol/L (ref 135–145)
TCO2: 25 mmol/L (ref 22–32)
TCO2: 25 mmol/L (ref 22–32)

## 2023-10-27 DIAGNOSIS — Z992 Dependence on renal dialysis: Secondary | ICD-10-CM | POA: Diagnosis not present

## 2023-10-27 DIAGNOSIS — N186 End stage renal disease: Secondary | ICD-10-CM | POA: Diagnosis not present

## 2023-10-29 DIAGNOSIS — Z992 Dependence on renal dialysis: Secondary | ICD-10-CM | POA: Diagnosis not present

## 2023-10-29 DIAGNOSIS — N186 End stage renal disease: Secondary | ICD-10-CM | POA: Diagnosis not present

## 2023-11-01 DIAGNOSIS — Z992 Dependence on renal dialysis: Secondary | ICD-10-CM | POA: Diagnosis not present

## 2023-11-01 DIAGNOSIS — N186 End stage renal disease: Secondary | ICD-10-CM | POA: Diagnosis not present

## 2023-11-03 DIAGNOSIS — Z992 Dependence on renal dialysis: Secondary | ICD-10-CM | POA: Diagnosis not present

## 2023-11-03 DIAGNOSIS — N186 End stage renal disease: Secondary | ICD-10-CM | POA: Diagnosis not present

## 2023-11-05 DIAGNOSIS — Z992 Dependence on renal dialysis: Secondary | ICD-10-CM | POA: Diagnosis not present

## 2023-11-05 DIAGNOSIS — N186 End stage renal disease: Secondary | ICD-10-CM | POA: Diagnosis not present

## 2023-11-08 DIAGNOSIS — Z992 Dependence on renal dialysis: Secondary | ICD-10-CM | POA: Diagnosis not present

## 2023-11-08 DIAGNOSIS — N186 End stage renal disease: Secondary | ICD-10-CM | POA: Diagnosis not present

## 2023-11-09 DIAGNOSIS — E1122 Type 2 diabetes mellitus with diabetic chronic kidney disease: Secondary | ICD-10-CM | POA: Diagnosis not present

## 2023-11-09 DIAGNOSIS — E785 Hyperlipidemia, unspecified: Secondary | ICD-10-CM | POA: Diagnosis not present

## 2023-11-10 DIAGNOSIS — N186 End stage renal disease: Secondary | ICD-10-CM | POA: Diagnosis not present

## 2023-11-10 DIAGNOSIS — Z992 Dependence on renal dialysis: Secondary | ICD-10-CM | POA: Diagnosis not present

## 2023-11-12 ENCOUNTER — Telehealth: Payer: Self-pay | Admitting: *Deleted

## 2023-11-12 ENCOUNTER — Encounter: Payer: Self-pay | Admitting: *Deleted

## 2023-11-12 DIAGNOSIS — Z992 Dependence on renal dialysis: Secondary | ICD-10-CM | POA: Diagnosis not present

## 2023-11-12 DIAGNOSIS — N186 End stage renal disease: Secondary | ICD-10-CM | POA: Diagnosis not present

## 2023-11-15 DIAGNOSIS — Z992 Dependence on renal dialysis: Secondary | ICD-10-CM | POA: Diagnosis not present

## 2023-11-15 DIAGNOSIS — N186 End stage renal disease: Secondary | ICD-10-CM | POA: Diagnosis not present

## 2023-11-16 DIAGNOSIS — I129 Hypertensive chronic kidney disease with stage 1 through stage 4 chronic kidney disease, or unspecified chronic kidney disease: Secondary | ICD-10-CM | POA: Diagnosis not present

## 2023-11-16 DIAGNOSIS — Z992 Dependence on renal dialysis: Secondary | ICD-10-CM | POA: Diagnosis not present

## 2023-11-16 DIAGNOSIS — E785 Hyperlipidemia, unspecified: Secondary | ICD-10-CM | POA: Diagnosis not present

## 2023-11-16 DIAGNOSIS — N186 End stage renal disease: Secondary | ICD-10-CM | POA: Diagnosis not present

## 2023-11-16 DIAGNOSIS — I132 Hypertensive heart and chronic kidney disease with heart failure and with stage 5 chronic kidney disease, or end stage renal disease: Secondary | ICD-10-CM | POA: Diagnosis not present

## 2023-11-16 DIAGNOSIS — I25110 Atherosclerotic heart disease of native coronary artery with unstable angina pectoris: Secondary | ICD-10-CM | POA: Diagnosis not present

## 2023-11-16 DIAGNOSIS — D649 Anemia, unspecified: Secondary | ICD-10-CM | POA: Diagnosis not present

## 2023-11-16 DIAGNOSIS — I5032 Chronic diastolic (congestive) heart failure: Secondary | ICD-10-CM | POA: Diagnosis not present

## 2023-11-16 DIAGNOSIS — I440 Atrioventricular block, first degree: Secondary | ICD-10-CM | POA: Diagnosis not present

## 2023-11-17 DIAGNOSIS — N186 End stage renal disease: Secondary | ICD-10-CM | POA: Diagnosis not present

## 2023-11-17 DIAGNOSIS — Z992 Dependence on renal dialysis: Secondary | ICD-10-CM | POA: Diagnosis not present

## 2023-11-18 ENCOUNTER — Encounter: Payer: Self-pay | Admitting: Internal Medicine

## 2023-11-19 ENCOUNTER — Telehealth: Payer: Self-pay

## 2023-11-19 DIAGNOSIS — N186 End stage renal disease: Secondary | ICD-10-CM | POA: Diagnosis not present

## 2023-11-19 DIAGNOSIS — Z992 Dependence on renal dialysis: Secondary | ICD-10-CM | POA: Diagnosis not present

## 2023-11-19 NOTE — Progress Notes (Signed)
 Complex Care Management Care Guide Note  11/19/2023 Name: [name] MRN: [id number] DOB: Sep 17, 1940  Trevor [name] is a 83 y.o. year old male who is a primary care patient of Shona, Norleen PEDLAR, MD and is actively engaged with the care management team. I reached out to Zachary VEAR Burr by phone today to assist with re-scheduling  with the RN Case Manager.  Follow up plan: Unsuccessful telephone outreach attempt made. A HIPAA compliant phone message was left for the patient providing contact information and requesting a return call.  Leotis Rase [facility], [facility] Guide  Direct Dial : [phone]  Fax [phone]

## 2023-11-20 DIAGNOSIS — N186 End stage renal disease: Secondary | ICD-10-CM | POA: Diagnosis not present

## 2023-11-20 DIAGNOSIS — Z992 Dependence on renal dialysis: Secondary | ICD-10-CM | POA: Diagnosis not present

## 2023-11-22 DIAGNOSIS — Z992 Dependence on renal dialysis: Secondary | ICD-10-CM | POA: Diagnosis not present

## 2023-11-22 DIAGNOSIS — N186 End stage renal disease: Secondary | ICD-10-CM | POA: Diagnosis not present

## 2023-11-24 DIAGNOSIS — N186 End stage renal disease: Secondary | ICD-10-CM | POA: Diagnosis not present

## 2023-11-24 DIAGNOSIS — Z992 Dependence on renal dialysis: Secondary | ICD-10-CM | POA: Diagnosis not present

## 2023-11-26 DIAGNOSIS — Z992 Dependence on renal dialysis: Secondary | ICD-10-CM | POA: Diagnosis not present

## 2023-11-26 DIAGNOSIS — N186 End stage renal disease: Secondary | ICD-10-CM | POA: Diagnosis not present

## 2023-11-29 DIAGNOSIS — Z992 Dependence on renal dialysis: Secondary | ICD-10-CM | POA: Diagnosis not present

## 2023-11-29 DIAGNOSIS — N186 End stage renal disease: Secondary | ICD-10-CM | POA: Diagnosis not present

## 2023-12-01 DIAGNOSIS — N186 End stage renal disease: Secondary | ICD-10-CM | POA: Diagnosis not present

## 2023-12-01 DIAGNOSIS — Z992 Dependence on renal dialysis: Secondary | ICD-10-CM | POA: Diagnosis not present

## 2023-12-02 ENCOUNTER — Other Ambulatory Visit: Payer: Self-pay | Admitting: *Deleted

## 2023-12-02 NOTE — Patient Outreach (Signed)
 Complex Care Management   Visit Note  01/20/2024 updated note for 12/02/23  Name:  Trevor [name] MRN: [id number] DOB: 08-Feb-1941  Situation: Referral received for Complex Care Management related to Acute MI and post hospital acute needs management  I obtained verbal consent from Patient.  Visit completed with Patient  on the phone  He reports he is doing fine Had issues with post nasal drip Eyes are better   Now on phosphorus pills Nephrology still follows him  Vitamins has increase strength in his legs   Background:   Past Medical History:  Diagnosis Date   Anemia    Chronic kidney disease    esrd- on dialysis   Diabetes mellitus without complication (HCC)    Hepatitis    not sure what type - over 20 years ago   History of kidney stones    Hyperlipidemia    Hypertension     Assessment: Patient Reported Symptoms:  Cognitive Cognitive Status: No symptoms reported      Neurological Neurological Review of Symptoms: No symptoms reported Neurological Self-Management Outcome: 4 (good)  HEENT HEENT Symptoms Reported: Nasal discharge HEENT Management Strategies: Medication therapy, Routine screening HEENT Self-Management Outcome: 3 (uncertain)    Cardiovascular Cardiovascular Symptoms Reported: No symptoms reported Cardiovascular Self-Management Outcome: 4 (good)  Respiratory Respiratory Symptoms Reported: No symptoms reported Respiratory Management Strategies: Routine screening Respiratory Self-Management Outcome: 4 (good)  Endocrine Endocrine Symptoms Reported: No symptoms reported Endocrine Self-Management Outcome: 4 (good)  Gastrointestinal Gastrointestinal Symptoms Reported: No symptoms reported Gastrointestinal Self-Management Outcome: 4 (good)    Genitourinary Genitourinary Symptoms Reported: No symptoms reported Genitourinary Self-Management Outcome: 4 (good)  Integumentary Integumentary Symptoms Reported: No symptoms reported Skin Self-Management Outcome: 4  (good)  Musculoskeletal Musculoskelatal Symptoms Reviewed: Joint pain, Weakness Musculoskeletal Management Strategies: Medical device, Medication therapy, Routine screening, Adequate rest Musculoskeletal Self-Management Outcome: 3 (uncertain)      Psychosocial Psychosocial Symptoms Reported: No symptoms reported Behavioral Health Self-Management Outcome: 4 (good)   Quality of Family Relationships: helpful, involved, supportive Do you feel physically threatened by others?: No    01/20/2024    PHQ2-9 Depression Screening   Little interest or pleasure in doing things Not at all  Feeling down, depressed, or hopeless Not at all  PHQ-2 - Total Score 0  Trouble falling or staying asleep, or sleeping too much    Feeling tired or having little energy    Poor appetite or overeating     Feeling bad about yourself - or that you are a failure or have let yourself or your family down    Trouble concentrating on things, such as reading the newspaper or watching television    Moving or speaking so slowly that other people could have noticed.  Or the opposite - being so fidgety or restless that you have been moving around a lot more than usual    Thoughts that you would be better off dead, or hurting yourself in some way    PHQ2-9 Total Score    If you checked off any problems, how difficult have these problems made it for you to do your work, take care of things at home, or get along with other people    Depression Interventions/Treatment      There were no vitals filed for this visit.  Medications Reviewed Today   Medications were not reviewed in this encounter     Recommendation:   PCP Follow-up Continue Current Plan of Care  Follow Up Plan:   Telephone follow up  appointment date/time:  03/03/24 with Hendricks Kay Suzen LITTIE. Ramonita, RN, BSN, CCM [place]  [facility], [facility] RN Care Manager Direct Dial : [phone]  Fax: [phone]

## 2023-12-03 DIAGNOSIS — N186 End stage renal disease: Secondary | ICD-10-CM | POA: Diagnosis not present

## 2023-12-03 DIAGNOSIS — Z992 Dependence on renal dialysis: Secondary | ICD-10-CM | POA: Diagnosis not present

## 2023-12-06 DIAGNOSIS — N186 End stage renal disease: Secondary | ICD-10-CM | POA: Diagnosis not present

## 2023-12-06 DIAGNOSIS — Z992 Dependence on renal dialysis: Secondary | ICD-10-CM | POA: Diagnosis not present

## 2023-12-08 DIAGNOSIS — N186 End stage renal disease: Secondary | ICD-10-CM | POA: Diagnosis not present

## 2023-12-08 DIAGNOSIS — Z992 Dependence on renal dialysis: Secondary | ICD-10-CM | POA: Diagnosis not present

## 2023-12-10 DIAGNOSIS — N186 End stage renal disease: Secondary | ICD-10-CM | POA: Diagnosis not present

## 2023-12-10 DIAGNOSIS — Z992 Dependence on renal dialysis: Secondary | ICD-10-CM | POA: Diagnosis not present

## 2023-12-13 DIAGNOSIS — Z992 Dependence on renal dialysis: Secondary | ICD-10-CM | POA: Diagnosis not present

## 2023-12-13 DIAGNOSIS — N186 End stage renal disease: Secondary | ICD-10-CM | POA: Diagnosis not present

## 2023-12-15 DIAGNOSIS — Z992 Dependence on renal dialysis: Secondary | ICD-10-CM | POA: Diagnosis not present

## 2023-12-15 DIAGNOSIS — N186 End stage renal disease: Secondary | ICD-10-CM | POA: Diagnosis not present

## 2023-12-17 DIAGNOSIS — N186 End stage renal disease: Secondary | ICD-10-CM | POA: Diagnosis not present

## 2023-12-17 DIAGNOSIS — Z992 Dependence on renal dialysis: Secondary | ICD-10-CM | POA: Diagnosis not present

## 2023-12-20 DIAGNOSIS — Z992 Dependence on renal dialysis: Secondary | ICD-10-CM | POA: Diagnosis not present

## 2023-12-20 DIAGNOSIS — N186 End stage renal disease: Secondary | ICD-10-CM | POA: Diagnosis not present

## 2023-12-22 DIAGNOSIS — N186 End stage renal disease: Secondary | ICD-10-CM | POA: Diagnosis not present

## 2023-12-22 DIAGNOSIS — Z992 Dependence on renal dialysis: Secondary | ICD-10-CM | POA: Diagnosis not present

## 2023-12-24 DIAGNOSIS — N186 End stage renal disease: Secondary | ICD-10-CM | POA: Diagnosis not present

## 2023-12-24 DIAGNOSIS — Z992 Dependence on renal dialysis: Secondary | ICD-10-CM | POA: Diagnosis not present

## 2023-12-27 DIAGNOSIS — N186 End stage renal disease: Secondary | ICD-10-CM | POA: Diagnosis not present

## 2023-12-27 DIAGNOSIS — Z992 Dependence on renal dialysis: Secondary | ICD-10-CM | POA: Diagnosis not present

## 2023-12-29 DIAGNOSIS — N186 End stage renal disease: Secondary | ICD-10-CM | POA: Diagnosis not present

## 2023-12-29 DIAGNOSIS — Z992 Dependence on renal dialysis: Secondary | ICD-10-CM | POA: Diagnosis not present

## 2023-12-31 DIAGNOSIS — Z992 Dependence on renal dialysis: Secondary | ICD-10-CM | POA: Diagnosis not present

## 2023-12-31 DIAGNOSIS — N186 End stage renal disease: Secondary | ICD-10-CM | POA: Diagnosis not present

## 2024-01-03 DIAGNOSIS — N186 End stage renal disease: Secondary | ICD-10-CM | POA: Diagnosis not present

## 2024-01-03 DIAGNOSIS — Z992 Dependence on renal dialysis: Secondary | ICD-10-CM | POA: Diagnosis not present

## 2024-01-05 DIAGNOSIS — N186 End stage renal disease: Secondary | ICD-10-CM | POA: Diagnosis not present

## 2024-01-05 DIAGNOSIS — Z992 Dependence on renal dialysis: Secondary | ICD-10-CM | POA: Diagnosis not present

## 2024-01-07 DIAGNOSIS — Z992 Dependence on renal dialysis: Secondary | ICD-10-CM | POA: Diagnosis not present

## 2024-01-07 DIAGNOSIS — N186 End stage renal disease: Secondary | ICD-10-CM | POA: Diagnosis not present

## 2024-01-10 DIAGNOSIS — N186 End stage renal disease: Secondary | ICD-10-CM | POA: Diagnosis not present

## 2024-01-10 DIAGNOSIS — Z992 Dependence on renal dialysis: Secondary | ICD-10-CM | POA: Diagnosis not present

## 2024-01-12 DIAGNOSIS — N186 End stage renal disease: Secondary | ICD-10-CM | POA: Diagnosis not present

## 2024-01-12 DIAGNOSIS — Z992 Dependence on renal dialysis: Secondary | ICD-10-CM | POA: Diagnosis not present

## 2024-01-20 NOTE — Patient Instructions (Signed)
 Visit Information  Thank you for taking time to visit with me today. Please don't hesitate to contact me if I can be of assistance to you before our next scheduled appointment.  Your next care management appointment is by telephone on 03/03/2024  at 1100  Please call the care guide team at [phone] if you need to cancel, schedule, or reschedule an appointment.   Please call the Suicide and Crisis Lifeline: 988 call the [place]  National Suicide Prevention Lifeline: 925-390-4439 or TTY: (769)811-7271 TTY 360-387-6785) to talk to a trained counselor call 1-800-273-TALK (toll free, 24 hour hotline) call the [facility]: [phone] call 911 if you are experiencing a Mental Health or Behavioral Health Crisis or need someone to talk to.   Foxx Klarich L. Ramonita, RN, BSN, CCM [place]  [facility], [facility] RN Care Manager Direct Dial : [phone]  Fax: [phone]'

## 2024-01-24 DIAGNOSIS — E1122 Type 2 diabetes mellitus with diabetic chronic kidney disease: Secondary | ICD-10-CM | POA: Diagnosis not present

## 2024-01-24 DIAGNOSIS — Z992 Dependence on renal dialysis: Secondary | ICD-10-CM | POA: Diagnosis not present

## 2024-01-24 DIAGNOSIS — N186 End stage renal disease: Secondary | ICD-10-CM | POA: Diagnosis not present

## 2024-01-25 DIAGNOSIS — N186 End stage renal disease: Secondary | ICD-10-CM | POA: Diagnosis not present

## 2024-01-25 DIAGNOSIS — E8770 Fluid overload, unspecified: Secondary | ICD-10-CM | POA: Diagnosis not present

## 2024-01-25 DIAGNOSIS — Z992 Dependence on renal dialysis: Secondary | ICD-10-CM | POA: Diagnosis not present

## 2024-02-12 DIAGNOSIS — Z992 Dependence on renal dialysis: Secondary | ICD-10-CM | POA: Diagnosis not present

## 2024-02-12 DIAGNOSIS — N186 End stage renal disease: Secondary | ICD-10-CM | POA: Diagnosis not present

## 2024-02-14 DIAGNOSIS — N186 End stage renal disease: Secondary | ICD-10-CM | POA: Diagnosis not present

## 2024-02-14 DIAGNOSIS — Z992 Dependence on renal dialysis: Secondary | ICD-10-CM | POA: Diagnosis not present

## 2024-02-16 DIAGNOSIS — N186 End stage renal disease: Secondary | ICD-10-CM | POA: Diagnosis not present

## 2024-02-16 DIAGNOSIS — Z992 Dependence on renal dialysis: Secondary | ICD-10-CM | POA: Diagnosis not present

## 2024-02-18 DIAGNOSIS — N186 End stage renal disease: Secondary | ICD-10-CM | POA: Diagnosis not present

## 2024-02-18 DIAGNOSIS — Z992 Dependence on renal dialysis: Secondary | ICD-10-CM | POA: Diagnosis not present

## 2024-02-21 DIAGNOSIS — N186 End stage renal disease: Secondary | ICD-10-CM | POA: Diagnosis not present

## 2024-02-21 DIAGNOSIS — Z992 Dependence on renal dialysis: Secondary | ICD-10-CM | POA: Diagnosis not present

## 2024-02-23 DIAGNOSIS — Z992 Dependence on renal dialysis: Secondary | ICD-10-CM | POA: Diagnosis not present

## 2024-02-23 DIAGNOSIS — N186 End stage renal disease: Secondary | ICD-10-CM | POA: Diagnosis not present

## 2024-02-24 ENCOUNTER — Encounter: Payer: Self-pay | Admitting: Internal Medicine

## 2024-02-24 ENCOUNTER — Telehealth: Payer: Self-pay

## 2024-02-24 DIAGNOSIS — I5032 Chronic diastolic (congestive) heart failure: Secondary | ICD-10-CM | POA: Diagnosis not present

## 2024-02-24 DIAGNOSIS — N186 End stage renal disease: Secondary | ICD-10-CM | POA: Diagnosis not present

## 2024-02-24 DIAGNOSIS — J302 Other seasonal allergic rhinitis: Secondary | ICD-10-CM | POA: Diagnosis not present

## 2024-02-24 DIAGNOSIS — E785 Hyperlipidemia, unspecified: Secondary | ICD-10-CM | POA: Diagnosis not present

## 2024-02-24 DIAGNOSIS — I130 Hypertensive heart and chronic kidney disease with heart failure and stage 1 through stage 4 chronic kidney disease, or unspecified chronic kidney disease: Secondary | ICD-10-CM | POA: Diagnosis not present

## 2024-02-24 DIAGNOSIS — D631 Anemia in chronic kidney disease: Secondary | ICD-10-CM | POA: Diagnosis not present

## 2024-02-24 DIAGNOSIS — I25110 Atherosclerotic heart disease of native coronary artery with unstable angina pectoris: Secondary | ICD-10-CM | POA: Diagnosis not present

## 2024-02-24 DIAGNOSIS — J069 Acute upper respiratory infection, unspecified: Secondary | ICD-10-CM | POA: Diagnosis not present

## 2024-02-24 DIAGNOSIS — Z992 Dependence on renal dialysis: Secondary | ICD-10-CM | POA: Diagnosis not present

## 2024-02-24 DIAGNOSIS — Z0001 Encounter for general adult medical examination with abnormal findings: Secondary | ICD-10-CM | POA: Diagnosis not present

## 2024-02-24 DIAGNOSIS — I440 Atrioventricular block, first degree: Secondary | ICD-10-CM | POA: Diagnosis not present

## 2024-02-24 DIAGNOSIS — I129 Hypertensive chronic kidney disease with stage 1 through stage 4 chronic kidney disease, or unspecified chronic kidney disease: Secondary | ICD-10-CM | POA: Diagnosis not present

## 2024-02-24 NOTE — Patient Instructions (Signed)
 Zachary VEAR Burr -  I work with Shona Norleen PEDLAR, MD and am calling to support your healthcare needs. I  have you scheduled for 03-04-2024 at 2:00 PM for a follow up telephone call Please contact me at [phone] at your earliest convenience. I look forward to speaking with you soon.   Thank you,  Hendricks Her RN, BSN  [place] [facility] RN Case Manager   Direct Dial -[phone]

## 2024-02-25 DIAGNOSIS — N186 End stage renal disease: Secondary | ICD-10-CM | POA: Diagnosis not present

## 2024-02-25 DIAGNOSIS — Z992 Dependence on renal dialysis: Secondary | ICD-10-CM | POA: Diagnosis not present

## 2024-02-28 DIAGNOSIS — Z992 Dependence on renal dialysis: Secondary | ICD-10-CM | POA: Diagnosis not present

## 2024-02-28 DIAGNOSIS — N186 End stage renal disease: Secondary | ICD-10-CM | POA: Diagnosis not present

## 2024-03-01 DIAGNOSIS — N186 End stage renal disease: Secondary | ICD-10-CM | POA: Diagnosis not present

## 2024-03-01 DIAGNOSIS — Z992 Dependence on renal dialysis: Secondary | ICD-10-CM | POA: Diagnosis not present

## 2024-03-03 ENCOUNTER — Telehealth: Payer: Self-pay

## 2024-03-03 ENCOUNTER — Telehealth

## 2024-03-03 NOTE — Patient Instructions (Signed)
 Zachary VEAR Burr - I am sorry I was unable to reach you today for our scheduled appointment. I work with Shona, Norleen PEDLAR, MD and am calling to support your healthcare needs. Please contact me at [phone] at your earliest convenience. I look forward to speaking with you soon.   Thank you,  Hendricks Her RN, BSN  [place] [facility] RN Case Manager   Direct Dial -[phone]

## 2024-03-06 DIAGNOSIS — N186 End stage renal disease: Secondary | ICD-10-CM | POA: Diagnosis not present

## 2024-03-06 DIAGNOSIS — Z992 Dependence on renal dialysis: Secondary | ICD-10-CM | POA: Diagnosis not present

## 2024-03-08 ENCOUNTER — Other Ambulatory Visit: Payer: Self-pay

## 2024-03-08 DIAGNOSIS — Z992 Dependence on renal dialysis: Secondary | ICD-10-CM | POA: Diagnosis not present

## 2024-03-08 DIAGNOSIS — N186 End stage renal disease: Secondary | ICD-10-CM | POA: Diagnosis not present

## 2024-03-08 NOTE — Patient Instructions (Signed)
 Trevor [name] - I have attempted to call you three times but have been unsuccessful in reaching you. I work with Trevor [name], Trevor PEDLAR, MD and am calling to support your healthcare needs. If I can be of assistance to you, please contact me at [phone].     Thank you,  Hendricks Her RN, BSN  [place] [facility] RN Case Manager   Direct Dial [phone]

## 2024-03-10 DIAGNOSIS — N186 End stage renal disease: Secondary | ICD-10-CM | POA: Diagnosis not present

## 2024-03-10 DIAGNOSIS — Z992 Dependence on renal dialysis: Secondary | ICD-10-CM | POA: Diagnosis not present

## 2024-03-17 ENCOUNTER — Other Ambulatory Visit: Payer: Self-pay | Admitting: Internal Medicine

## 2024-03-20 DIAGNOSIS — N186 End stage renal disease: Secondary | ICD-10-CM | POA: Diagnosis not present

## 2024-03-20 DIAGNOSIS — Z992 Dependence on renal dialysis: Secondary | ICD-10-CM | POA: Diagnosis not present
